# Patient Record
Sex: Male | Born: 1935 | Race: White | Hispanic: No | Marital: Married | Smoking: Former smoker
Health system: Southern US, Community
[De-identification: ages and names within clinical notes are randomized; demographics above are authoritative.]

## PROBLEM LIST (undated history)

## (undated) DIAGNOSIS — C44301 Unspecified malignant neoplasm of skin of nose: Secondary | ICD-10-CM

## (undated) DIAGNOSIS — C61 Malignant neoplasm of prostate: Secondary | ICD-10-CM

## (undated) DIAGNOSIS — E785 Hyperlipidemia, unspecified: Secondary | ICD-10-CM

## (undated) DIAGNOSIS — C44509 Unspecified malignant neoplasm of skin of other part of trunk: Secondary | ICD-10-CM

## (undated) DIAGNOSIS — G4733 Obstructive sleep apnea (adult) (pediatric): Secondary | ICD-10-CM

## (undated) DIAGNOSIS — I4892 Unspecified atrial flutter: Secondary | ICD-10-CM

## (undated) DIAGNOSIS — M4800 Spinal stenosis, site unspecified: Secondary | ICD-10-CM

## (undated) DIAGNOSIS — C44599 Other specified malignant neoplasm of skin of other part of trunk: Secondary | ICD-10-CM

## (undated) DIAGNOSIS — I38 Endocarditis, valve unspecified: Secondary | ICD-10-CM

## (undated) DIAGNOSIS — Z8719 Personal history of other diseases of the digestive system: Secondary | ICD-10-CM

## (undated) DIAGNOSIS — I6529 Occlusion and stenosis of unspecified carotid artery: Secondary | ICD-10-CM

## (undated) DIAGNOSIS — N200 Calculus of kidney: Secondary | ICD-10-CM

## (undated) DIAGNOSIS — I10 Essential (primary) hypertension: Secondary | ICD-10-CM

## (undated) DIAGNOSIS — E119 Type 2 diabetes mellitus without complications: Secondary | ICD-10-CM

## (undated) DIAGNOSIS — K219 Gastro-esophageal reflux disease without esophagitis: Secondary | ICD-10-CM

## (undated) DIAGNOSIS — I2510 Atherosclerotic heart disease of native coronary artery without angina pectoris: Secondary | ICD-10-CM

## (undated) DIAGNOSIS — IMO0002 Reserved for concepts with insufficient information to code with codable children: Secondary | ICD-10-CM

## (undated) DIAGNOSIS — N320 Bladder-neck obstruction: Secondary | ICD-10-CM

## (undated) DIAGNOSIS — F329 Major depressive disorder, single episode, unspecified: Secondary | ICD-10-CM

## (undated) DIAGNOSIS — I209 Angina pectoris, unspecified: Secondary | ICD-10-CM

## (undated) DIAGNOSIS — I639 Cerebral infarction, unspecified: Secondary | ICD-10-CM

## (undated) DIAGNOSIS — D649 Anemia, unspecified: Secondary | ICD-10-CM

## (undated) HISTORY — PX: PROSTATE BIOPSY: SHX241

## (undated) HISTORY — DX: Occlusion and stenosis of unspecified carotid artery: I65.29

## (undated) HISTORY — PX: CARDIAC VALVE REPLACEMENT: SHX585

## (undated) HISTORY — DX: Major depressive disorder, single episode, unspecified: F32.9

## (undated) HISTORY — DX: Spinal stenosis, site unspecified: M48.00

## (undated) HISTORY — DX: Endocarditis, valve unspecified: I38

## (undated) HISTORY — PX: SKIN CANCER EXCISION: SHX779

## (undated) HISTORY — DX: Hyperlipidemia, unspecified: E78.5

## (undated) HISTORY — DX: Malignant neoplasm of prostate: C61

## (undated) HISTORY — DX: Reserved for concepts with insufficient information to code with codable children: IMO0002

## (undated) HISTORY — DX: Unspecified atrial flutter: I48.92

## (undated) HISTORY — DX: Atherosclerotic heart disease of native coronary artery without angina pectoris: I25.10

---
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
## 1978-11-19 HISTORY — PX: CERVICAL DISC SURGERY: SHX588

## 1994-03-20 HISTORY — PX: LUMBAR DISC SURGERY: SHX700

## 1995-03-21 HISTORY — PX: POSTERIOR FUSION LUMBAR SPINE: SUR632

## 1998-11-19 HISTORY — PX: ANTERIOR CERVICAL DECOMP/DISCECTOMY FUSION: SHX1161

## 1999-07-02 ENCOUNTER — Ambulatory Visit: Admission: RE | Admit: 1999-07-02 | Discharge: 1999-07-02 | Payer: Self-pay | Admitting: Internal Medicine

## 2000-08-05 ENCOUNTER — Ambulatory Visit (HOSPITAL_COMMUNITY): Admission: RE | Admit: 2000-08-05 | Discharge: 2000-08-05 | Payer: Self-pay | Admitting: Neurological Surgery

## 2000-08-05 ENCOUNTER — Encounter: Payer: Self-pay | Admitting: Neurological Surgery

## 2000-08-11 ENCOUNTER — Encounter: Payer: Self-pay | Admitting: Neurological Surgery

## 2000-08-11 ENCOUNTER — Ambulatory Visit (HOSPITAL_COMMUNITY): Admission: RE | Admit: 2000-08-11 | Discharge: 2000-08-11 | Payer: Self-pay | Admitting: Neurological Surgery

## 2000-08-15 ENCOUNTER — Encounter: Admission: RE | Admit: 2000-08-15 | Discharge: 2000-11-13 | Payer: Self-pay | Admitting: Neurological Surgery

## 2001-12-27 ENCOUNTER — Encounter: Admission: RE | Admit: 2001-12-27 | Discharge: 2002-02-04 | Payer: Self-pay | Admitting: Neurological Surgery

## 2003-05-05 ENCOUNTER — Encounter (INDEPENDENT_AMBULATORY_CARE_PROVIDER_SITE_OTHER): Payer: Self-pay | Admitting: *Deleted

## 2003-05-05 ENCOUNTER — Inpatient Hospital Stay (HOSPITAL_COMMUNITY): Admission: RE | Admit: 2003-05-05 | Discharge: 2003-05-06 | Payer: Self-pay | Admitting: *Deleted

## 2003-05-05 HISTORY — PX: CAROTID ENDARTERECTOMY: SUR193

## 2003-05-08 ENCOUNTER — Inpatient Hospital Stay (HOSPITAL_COMMUNITY): Admission: EM | Admit: 2003-05-08 | Discharge: 2003-05-12 | Payer: Self-pay | Admitting: Emergency Medicine

## 2003-05-09 HISTORY — PX: HEMATOMA EVACUATION: SHX5118

## 2003-06-16 ENCOUNTER — Encounter: Admission: RE | Admit: 2003-06-16 | Discharge: 2003-06-16 | Payer: Self-pay | Admitting: *Deleted

## 2003-06-24 ENCOUNTER — Inpatient Hospital Stay (HOSPITAL_COMMUNITY): Admission: EM | Admit: 2003-06-24 | Discharge: 2003-06-29 | Payer: Self-pay | Admitting: Emergency Medicine

## 2003-08-19 ENCOUNTER — Ambulatory Visit (HOSPITAL_COMMUNITY): Admission: RE | Admit: 2003-08-19 | Discharge: 2003-08-19 | Payer: Self-pay | Admitting: Internal Medicine

## 2003-08-19 ENCOUNTER — Encounter: Payer: Self-pay | Admitting: Cardiology

## 2003-10-09 ENCOUNTER — Encounter (INDEPENDENT_AMBULATORY_CARE_PROVIDER_SITE_OTHER): Payer: Self-pay | Admitting: Specialist

## 2003-10-09 ENCOUNTER — Ambulatory Visit (HOSPITAL_COMMUNITY): Admission: RE | Admit: 2003-10-09 | Discharge: 2003-10-09 | Payer: Self-pay | Admitting: Gastroenterology

## 2003-10-20 ENCOUNTER — Ambulatory Visit (HOSPITAL_COMMUNITY): Admission: RE | Admit: 2003-10-20 | Discharge: 2003-10-21 | Payer: Self-pay | Admitting: *Deleted

## 2003-10-20 HISTORY — PX: CARDIAC CATHETERIZATION: SHX172

## 2003-10-20 HISTORY — PX: CORONARY ANGIOPLASTY WITH STENT PLACEMENT: SHX49

## 2004-09-08 ENCOUNTER — Ambulatory Visit (HOSPITAL_COMMUNITY): Admission: RE | Admit: 2004-09-08 | Discharge: 2004-09-08 | Payer: Self-pay | Admitting: *Deleted

## 2005-07-04 ENCOUNTER — Encounter: Admission: RE | Admit: 2005-07-04 | Discharge: 2005-07-04 | Payer: Self-pay | Admitting: Internal Medicine

## 2005-07-25 ENCOUNTER — Ambulatory Visit (HOSPITAL_COMMUNITY): Admission: RE | Admit: 2005-07-25 | Discharge: 2005-07-25 | Payer: Self-pay | Admitting: Neurological Surgery

## 2005-07-27 ENCOUNTER — Encounter: Admission: RE | Admit: 2005-07-27 | Discharge: 2005-08-18 | Payer: Self-pay | Admitting: Neurological Surgery

## 2005-08-14 ENCOUNTER — Emergency Department (HOSPITAL_COMMUNITY): Admission: EM | Admit: 2005-08-14 | Discharge: 2005-08-14 | Payer: Self-pay | Admitting: Emergency Medicine

## 2005-08-22 ENCOUNTER — Inpatient Hospital Stay (HOSPITAL_COMMUNITY): Admission: RE | Admit: 2005-08-22 | Discharge: 2005-08-23 | Payer: Self-pay | Admitting: Neurological Surgery

## 2006-10-03 ENCOUNTER — Ambulatory Visit (HOSPITAL_COMMUNITY): Admission: RE | Admit: 2006-10-03 | Discharge: 2006-10-03 | Payer: Self-pay | Admitting: Gastroenterology

## 2006-10-03 ENCOUNTER — Encounter (INDEPENDENT_AMBULATORY_CARE_PROVIDER_SITE_OTHER): Payer: Self-pay | Admitting: Gastroenterology

## 2007-01-07 ENCOUNTER — Ambulatory Visit (HOSPITAL_COMMUNITY): Admission: RE | Admit: 2007-01-07 | Discharge: 2007-01-07 | Payer: Self-pay | Admitting: Orthopedic Surgery

## 2007-01-07 HISTORY — PX: SHOULDER ARTHROSCOPY: SHX128

## 2007-02-07 ENCOUNTER — Ambulatory Visit: Payer: Self-pay | Admitting: *Deleted

## 2007-11-14 ENCOUNTER — Encounter: Admission: RE | Admit: 2007-11-14 | Discharge: 2008-02-12 | Payer: Self-pay | Admitting: Neurological Surgery

## 2007-11-15 ENCOUNTER — Ambulatory Visit (HOSPITAL_COMMUNITY): Admission: RE | Admit: 2007-11-15 | Discharge: 2007-11-15 | Payer: Self-pay | Admitting: Neurological Surgery

## 2008-02-06 ENCOUNTER — Ambulatory Visit: Payer: Self-pay | Admitting: *Deleted

## 2008-02-18 ENCOUNTER — Encounter: Admission: RE | Admit: 2008-02-18 | Discharge: 2008-03-19 | Payer: Self-pay | Admitting: Neurological Surgery

## 2009-01-29 ENCOUNTER — Ambulatory Visit: Payer: Self-pay | Admitting: Vascular Surgery

## 2009-04-06 ENCOUNTER — Encounter: Admission: RE | Admit: 2009-04-06 | Discharge: 2009-05-19 | Payer: Self-pay | Admitting: Internal Medicine

## 2009-09-02 ENCOUNTER — Encounter: Admission: RE | Admit: 2009-09-02 | Discharge: 2009-09-02 | Payer: Self-pay | Admitting: Internal Medicine

## 2010-02-18 ENCOUNTER — Ambulatory Visit: Payer: Self-pay | Admitting: Vascular Surgery

## 2010-03-04 ENCOUNTER — Ambulatory Visit: Payer: Self-pay | Admitting: Vascular Surgery

## 2010-03-20 DIAGNOSIS — C44301 Unspecified malignant neoplasm of skin of nose: Secondary | ICD-10-CM

## 2010-03-20 HISTORY — DX: Unspecified malignant neoplasm of skin of nose: C44.301

## 2010-04-10 ENCOUNTER — Encounter: Payer: Self-pay | Admitting: *Deleted

## 2010-05-25 ENCOUNTER — Ambulatory Visit (HOSPITAL_COMMUNITY)
Admission: RE | Admit: 2010-05-25 | Discharge: 2010-05-25 | Disposition: A | Discharge status: Home / Self Care | Payer: Medicare Other | Source: Ambulatory Visit | Attending: Interventional Cardiology | Admitting: Interventional Cardiology

## 2010-05-25 DIAGNOSIS — F341 Dysthymic disorder: Secondary | ICD-10-CM | POA: Insufficient documentation

## 2010-05-25 DIAGNOSIS — R0989 Other specified symptoms and signs involving the circulatory and respiratory systems: Secondary | ICD-10-CM | POA: Insufficient documentation

## 2010-05-25 DIAGNOSIS — R079 Chest pain, unspecified: Secondary | ICD-10-CM | POA: Insufficient documentation

## 2010-05-25 DIAGNOSIS — R0609 Other forms of dyspnea: Secondary | ICD-10-CM | POA: Insufficient documentation

## 2010-05-25 DIAGNOSIS — E669 Obesity, unspecified: Secondary | ICD-10-CM | POA: Insufficient documentation

## 2010-05-25 DIAGNOSIS — Z7982 Long term (current) use of aspirin: Secondary | ICD-10-CM | POA: Insufficient documentation

## 2010-05-25 DIAGNOSIS — F17200 Nicotine dependence, unspecified, uncomplicated: Secondary | ICD-10-CM | POA: Insufficient documentation

## 2010-07-29 ENCOUNTER — Other Ambulatory Visit: Payer: Self-pay | Admitting: Dermatology

## 2010-08-02 NOTE — Op Note (Signed)
[name], [name]              ACCOUNT NO.:  [id number]   MEDICAL RECORD NO.:  [id number]          PATIENT TYPE:  AMB   LOCATION:  ENDO                         FACILITY:  [facility]   PHYSICIAN:  Danise Edge, M.D.   DATE OF BIRTH:  1935-12-09   DATE OF PROCEDURE:  10/03/2006  DATE OF DISCHARGE:                               OPERATIVE REPORT   PROCEDURE:  1. Savary esophageal dilation  2. Distal esophageal biopsy.   PROCEDURE INDICATIONS:  Mr. Shannon Kirkendall is a 75 year old male born  03/24/1935.  Mr. Nghiem has undergone esophageal dilation in  the past to dilate a benign peptic stricture at the esophagogastric  junction.  He reports mild solid food dysphagia.   ENDOSCOPIST:  Danise Edge, M.D.   PREMEDICATION:  Fentanyl 50 mcg, Versed 5 mg.   DESCRIPTION OF PROCEDURE:  After obtaining informed consent Mr.  Chavarria was placed in the left lateral decubitus position.  I  administered intravenous fentanyl and intravenous Versed to achieve  conscious sedation for the procedure.  The patient's blood pressure,  oxygen saturation, and cardiac rhythm were monitored throughout the  procedure and documented in the medical record.   The Pentax gastroscope was passed through the posterior hypopharynx into  the proximal esophagus without difficulty.  I did not examine the larynx  or vocal cords.   ESOPHAGOSCOPY:  The proximal and mid segments of the esophagus appeared  normal.  The esophagogastric junction is friable, associated with a  short segment of benign-appearing peptic stricture.  There is no  endoscopic evidence for the presence of Barrett's esophagus.   GASTROSCOPY:  Retroflex view of the gastric cardia and fundus was  normal.  The gastric body appears normal.  There are scattered erosions  in the gastric antrum probably secondary to aspirin.  The pylorus  appears normal.   DUODENOSCOPY:  The duodenal bulb and descending duodenum appear normal.   SAVARY  ESOPHAGEAL DILATION:  The Savary dilator wire was passed through  the gastroscope and advanced to the distal gastric antrum.  The 15-mm  Savary dilator passed without resistance.  Repeat esophagogastrostomy  showed no gastric trauma due to the guidewire.  Biopsies were taken at  the esophagogastric junction.   ASSESSMENT:  Benign peptic stricture at the esophagogastric junction  dilated with the 15-mm Savary dilator.  Esophageal biopsies were  performed at the esophagogastric junction.  Erosions probably due to  chronic aspirin use are present in the distal gastric antrum.           ______________________________  Danise Edge, M.D.     MJ/MEDQ  D:  10/03/2006  T:  10/04/2006  Job:  [id number]

## 2010-08-02 NOTE — Op Note (Signed)
[name], [name]              ACCOUNT NO.:  [id number]   MEDICAL RECORD NO.:  [id number]          PATIENT TYPE:  AMB   LOCATION:  SDS                          FACILITY:  [facility]   PHYSICIAN:  Loreta Ave, M.D. DATE OF BIRTH:  September 05, 1935   DATE OF PROCEDURE:  DATE OF DISCHARGE:  01/07/2007                               OPERATIVE REPORT   PREOPERATIVE DIAGNOSIS:  Left shoulder impingement, labral tear.  Distal  clavicle osteolysis secondary adhesive capsulitis.   POSTOPERATIVE DIAGNOSIS:  Left shoulder impingement, labral tear.  Distal clavicle osteolysis secondary adhesive capsulitis, with also  partial tearing long head biceps tendon.   PROCEDURE:  Left shoulder exam under anesthesia followed by  manipulation.  Arthroscopy, debridement of labrum, lysis debridement of  adhesions.  Acromioplasty, coracoacromial ligament release.  Excision  distal clavicle.   SURGEON:  Loreta Ave, MD   ASSISTANT:  Zonia Kief, PA   ANESTHESIA:  General.   BLOOD LOSS:  Minimal.   SPECIMENS:  None.   CULTURES:  None.   COMPLICATIONS:  None.   DRESSING:  Soft compressive with sling.   PROCEDURE:  The patient brought to the operating room, placed on  operating table supine position.  After adequate anesthesia been  obtained, the symptomatic left shoulder examined.  Moderate restricted  motion 25-30% all planes.  Gently manipulated breaking up obvious  adhesions achieving full motion maintaining stable shoulder.  Placed in  beach-chair position on the shoulder positioner, after prepped and  draped usual sterile fashion.  Three portals, anterior, posterior and  lateral.  Shoulder entered with blunt obturator.  Arthroscope  introduced, shoulder distended and inspected.  Marked circumferential  tearing labrum debrided.  Intra-articular adhesions debrided.  Some  grade 2 change on the glenoid and a small focal grade 4 area of the  front of the glenoid.  Two chondral loose bodies.   Biceps tendon was  still intact and anchored at the top of the glenoid but with some  fraying and thinning.  Majority was still intact after debridement.  Not  detached on top.  Undersurface of cuff looked good.  Cannula redirected  subacromially.  Type 2 acromion.  Obvious impingement, reactive  bursitis, fraying on the top of the cuff but no full-thickness tears.  Cuff debrided.  Bursa resected.  Acromioplasty to a type 1 acromion with  shaver high-speed bur, releasing the CA ligament cautery.  Distal  clavicle grade 4 changes.  Periarticular spurs and lateral centimeter of  clavicle resected.  Adequacy of decompression clavicle excision cuff  debridement confirmed viewing  from all portals.  Instruments and fluid removed.  Portals injected  Marcaine closed with nylon.  Sterile compressive dressing applied.  Sling applied.  Anesthesia reversed.  Brought to recovery room.  Tolerated surgery well.  No complications.      Loreta Ave, M.D.  Electronically Signed     DFM/MEDQ  D:  01/07/2007  T:  01/08/2007  Job:  [id number]

## 2010-08-02 NOTE — Procedures (Signed)
CAROTID DUPLEX EXAM   INDICATION:  Followup carotid disease.   HISTORY:  Diabetes:  Yes.  Cardiac:  CAD with stents.  Hypertension:  Yes.  Smoking:  Quit.  Previous Surgery:  Right CEA 2005 performed by Dr. Madilyn Fireman.  CV History:  Amaurosis Fugax No, Paresthesias No, Hemiparesis No                                       RIGHT             LEFT  Brachial systolic pressure:         146               144  Brachial Doppler waveforms:         WNL               WNL  Vertebral direction of flow:        Antegrade         Antegrade/abnormal  DUPLEX VELOCITIES (cm/sec)  CCA peak systolic                   98                110  ECA peak systolic                   131               110  ICA peak systolic                   99                156  ICA end diastolic                   13                29  PLAQUE MORPHOLOGY:                  NA                Heterogeneous  PLAQUE AMOUNT:                      Minimal           Mild  PLAQUE LOCATION:                    NA                CCA/ICA   IMPRESSION:  1. Widely patent right carotid endarterectomy without evidence of      restenosis.  2. 40%-59% left internal carotid artery possibly elevated due to mild      tortuosity.  3. Antegrade vertebral arteries with abnormal waveforms on the left.  4. Stable findings compared to the report of 01/29/2009.   ___________________________________________  Leonides Sake, MD   LT/MEDQ  D:  02/18/2010  T:  02/18/2010  Job:  [id number]

## 2010-08-02 NOTE — Procedures (Signed)
CAROTID DUPLEX EXAM   INDICATION:  Followup known carotid artery disease.   HISTORY:  Diabetes:  Yes.  Cardiac:  Coronary artery disease, cardiac stents in 10/2003.  Hypertension:  Yes.  Smoking:  Quit.  Previous Surgery:  Right carotid endarterectomy with DPA in 2005 by Dr.  Madilyn Fireman.  CV History:  No.  Amaurosis Fugax No, Paresthesias No, Hemiparesis No                                       RIGHT             LEFT  Brachial systolic pressure:         140               130  Brachial Doppler waveforms:         Normal            Normal  Vertebral direction of flow:        Antegrade         Antegrade  DUPLEX VELOCITIES (cm/sec)  CCA peak systolic                   81                104  ECA peak systolic                   110               120  ICA peak systolic                   77                119  ICA end diastolic                   13                33  PLAQUE MORPHOLOGY:                  N/A               Calcified  PLAQUE AMOUNT:                      N/A               Mild  PLAQUE LOCATION:                    N/A               ICA / ECA   IMPRESSION:  1. Right internal carotid artery shows no evidence of restenosis      status post carotid endarterectomy.  2. Left internal carotid artery shows 20%-39% stenosis.  3. Left internal carotid appears tortuous.  4. Antegrade flow in bilateral vertebrals.   ___________________________________________  Di Kindle. Edilia Bo, M.D.   CB/MEDQ  D:  01/29/2009  T:  01/29/2009  Job:  [id number]

## 2010-08-02 NOTE — Assessment & Plan Note (Signed)
OFFICE VISIT   [name], [name]  DOB:  Sep 09, 1935                                       03/04/2010  NWGNF#:[id number]   HISTORY OF PRESENT ILLNESS:  This is a 75 year old gentleman who  presents for routine follow-up.  He was last seen by a physician in  2005.  His last carotid duplex was completed in January 21, 2009.  At  that point, it demonstrated no recurrence of his right internal carotid  stenosis status post a right carotid endarterectomy and a 20-39%  stenosis in the left internal carotid artery.  He has never had any  previous stroke or TIA symptoms involving the left side, specifically,  he has never had any amaurosis fugax, monocular blindness, any facial  drooping or hemiplegia and also never any expressive or receptive  aphasia.  His risk factors for carotid disease include hyperlipidemia,  hypertension, and diabetes.  His risk factor management included use of  Crestor, aspirin, metformin and Diovan.  His neurologic review of  systems was otherwise negative except for a history of left leg and  right arm weakness related to multiple spinal surgeries.   PAST MEDICAL HISTORY:  Coronary artery disease, major depression,  hyperlipidemia, the left leg and right arm weakness related to spinal  stenosis and degenerative disk disease, mitral valve prolapse and aortic  valve her regurgitation and also diabetes.   PAST SURGICAL HISTORY:  Includes a appendectomy, tonsillectomy,  adenoidectomy, multiple spinal surgeries both in the C-spine and the  lumbar.  He has had a right carotid endarterectomy done in 2005 and has  had 2 cardiac stents placed and then a left shoulder repair.   SOCIAL HISTORY:  Previously was an active smoker greater than 80 pack  year smoking.  However, he quit in July 1986.  No alcohol or illicit  drug use.   FAMILY HISTORY:  He notes his father had a stroke.  Mother had diabetes  and coronary artery disease.   MEDICATIONS:   His medications included Crestor, Prozac, metformin,  Diovan, Prilosec and aspirin.   ALLERGIES:  No known drug allergies.   REVIEW OF SYSTEMS:  He noted 2 bad cardiac valve and reflux.  Otherwise  the rest of his review of systems was noted to be negative.   PHYSICAL EXAMINATION:  On physical examination today, he had a blood  pressure 159/55 in the right arm and in the left arm 148/64, heart rate  of 86, respirations of 14.  GENERAL:  He is alert and oriented x3, well-developed, well-nourished.  HEAD:  Normocephalic, atraumatic.  ENT:  Hearing was grossly intact.  Nares without any erythema or  drainage.  The oropharynx had erythema without any exudate.  EYES:  Pupils were equal, round, and reactive to light.  Extraocular  movements were intact.  NECK:  Supple neck.  No nuchal rigidity.  No palpable lymphadenopathy.  PULMONARY:  He has symmetric expansion.  Good air movement.  He is clear  to auscultation bilaterally.  He had no rales, rhonchi or wheezing.  CARDIAC:  Regular rate and rhythm.  Normal S1, S2.  No murmurs, rubs,  thrills or gallops.  VASCULAR:  He had palpable radial and brachial pulses and palpable  carotid pulses bilaterally.  His aorta was not palpable.  Femorals were  palpable as were popliteal, dorsalis pedis and posterior tibials  bilaterally.  GI:  He has soft abdomen nontender, nondistended guarding or rebound.  No splenomegaly.  No costovertebral angle tenderness.  MUSCULOSKELETAL:  He had 5/5 strength in all extremities.  He had no  ulcerations or gangrene in any extremity.  NEURO:  Cranial nerves II-XII were intact.  Motor exam was as listed  above.  He has sensation intact in all extremities.  PSYCH:  Judgment is intact.  Mood and affect were appropriate for his  clinical situation.  SKIN:  He had some actinic keratoses on the face, otherwise I saw no  rashes elsewhere.  EXTREMITIES:  As listed above.  LYMPHATICS:  No cervical, axillary, or inguinal  lymphadenopathy.   Noninvasive vascular imaging:  He had bilateral carotid duplexes  completed.  On the right side, the peak systolic velocity is 99 cm/sec  and his peak systolic on the left was 156.  Interpretation:  Widely  patent right internal carotid artery, status post endarterectomy.  On  the left, velocities increased which was puts it up to 40-59% of  stenosis on the left but this is not really significant findings  compared to the previous velocities.   MEDICAL DECISION MAKING:  This is a 75 year old gentleman who is  completely asymptomatic now status post a right carotid endarterectomy  who has a 40-59% left internal carotid artery stenosis which is not  significantly higher from his previous measurement.  He should be placed  on an annual duplex surveillance.  At this point, there is no indication  for any type of intervention based on ACAS data.  Will continue to see  him on an annual basis.  Thank you for allowing [place] to participate in  this patient'[name] care.     Fransisco Hertz, MD  Electronically Signed   BLC/MEDQ  D:  03/04/2010  T:  03/07/2010  Job:  [id number]

## 2010-08-02 NOTE — Procedures (Signed)
CAROTID DUPLEX EXAM   INDICATION:  Follow up carotid artery disease.   HISTORY:  Diabetes:  Yes.  Cardiac:  CAD, cardiac stents in 10/2003.  Hypertension:  Yes.  Smoking:  Quit.  Previous Surgery:  Right CEA with DPA in 2005 by Dr. Madilyn Fireman.  CV History:  No.  Amaurosis Fugax No, Paresthesias No, Hemiparesis No.                                       RIGHT             LEFT  Brachial systolic pressure:         148               154  Brachial Doppler waveforms:         Triphasic         Triphasic  Vertebral direction of flow:        Antegrade         Antegrade  DUPLEX VELOCITIES (cm/sec)  CCA peak systolic                   75                142  ECA peak systolic                   151               128  ICA peak systolic                   87                100  ICA end diastolic                   20                22  PLAQUE MORPHOLOGY:                  N/A               Calcified  PLAQUE AMOUNT:                      N/A               Mild  PLAQUE LOCATION:                    N/A               ICA/ECA/CCA   IMPRESSION:  1. Right internal carotid artery shows no evidence of restenosis,      status post carotid endarterectomy.  2. Left internal carotid artery shows evidence of 20-39% stenosis.  3. Left internal carotid artery appears tortuous.  4. Mild common carotid artery stenosis.  5. No significant changes from previous study.   ___________________________________________  P. Liliane Bade, M.D.   AS/MEDQ  D:  02/06/2008  T:  02/06/2008  Job:  [id number]

## 2010-08-02 NOTE — Procedures (Signed)
CAROTID DUPLEX EXAM   INDICATION:  Followup carotid artery disease.   HISTORY:  Diabetes:  Yes.  Cardiac:  Coronary artery disease, cardiac stents in August of 2005.  Hypertension:  Yes.  Smoking:  Quit in 1980.  Previous Surgery:  Right carotid endarterectomy with DPA in 2005 by Dr.  Madilyn Fireman.  CV History:  Amaurosis Fugax No, Paresthesias No, Hemiparesis No                                       RIGHT             LEFT  Brachial systolic pressure:         150               144  Brachial Doppler waveforms:         Biphasic          Biphasic  Vertebral direction of flow:        Antegrade         Antegrade  DUPLEX VELOCITIES (cm/sec)  CCA peak systolic                   108               146  ECA peak systolic                   125               101  ICA peak systolic                   66                83  ICA end diastolic                   14                16  PLAQUE MORPHOLOGY:                  None              Calcified  PLAQUE AMOUNT:                      None              Mild  PLAQUE LOCATION:                                      CCA, ECA   IMPRESSION:  1. No right ICA stenosis status post endarterectomy.  2. A 20% to 39% left ICA stenosis.  3. Mildly elevated velocities in the left CCA are stable from previous      exam.  4. Study essentially unchanged from 02/14/2006.   ___________________________________________  P. Liliane Bade, M.D.   DP/MEDQ  D:  02/07/2007  T:  02/08/2007  Job:  [id number]

## 2010-08-03 ENCOUNTER — Ambulatory Visit (HOSPITAL_COMMUNITY)
Admission: RE | Admit: 2010-08-03 | Discharge: 2010-08-03 | Disposition: A | Discharge status: Home / Self Care | Payer: Medicare Other | Source: Ambulatory Visit | Attending: Internal Medicine | Admitting: Internal Medicine

## 2010-08-03 DIAGNOSIS — I4891 Unspecified atrial fibrillation: Secondary | ICD-10-CM | POA: Insufficient documentation

## 2010-08-03 DIAGNOSIS — Z01811 Encounter for preprocedural respiratory examination: Secondary | ICD-10-CM | POA: Insufficient documentation

## 2010-08-03 DIAGNOSIS — Z79899 Other long term (current) drug therapy: Secondary | ICD-10-CM | POA: Insufficient documentation

## 2010-08-05 NOTE — Consult Note (Signed)
NAME:  [name], [name]                        ACCOUNT NO.:  [id number]   MEDICAL RECORD NO.:  [id number]                   PATIENT TYPE:  INP   LOCATION:  3742                                 FACILITY:  [facility]   PHYSICIAN:  Thora Lance, M.D.               DATE OF BIRTH:  [birth year]   DATE OF CONSULTATION:  DATE OF DISCHARGE:                                   CONSULTATION   HISTORY OF PRESENT ILLNESS:  A 75 year old white male, status post right  carotid endarterectomy on May 05, 2003, who is admitted with a severe  right-sided headache, blurred vision, nausea, and vomiting.  His blood  pressure was high as 234/88 last night in the ER and down to 153/67 after  one dose of labetalol last night and pain control. I was asked to see the  patient for ongoing medical care as the patient has not had a primary care  physician in [place].   PAST MEDICAL HISTORY:  1. GERD for years, history of esophageal stricture.  2. Borderline diabetes mellitus, blood sugars dropped after 20 pound weight     loss.  3. High cholesterol, was in cholesterol study at [facility] until right     carotid blockage was found.  4. Depression.  5. Status post right carotid endarterectomy.   PAST SURGICAL HISTORY:  1. Right carotid endarterectomy on May 05, 2003, by Dr. Madilyn Fireman.  2. Lumbosacral surgery.  3. One cervical spine surgery.  4. Appendectomy.  5. Tonsillectomy.   ALLERGIES:  NO KNOWN DRUG ALLERGIES   CURRENT MEDICATIONS:  1. Prozac 20 mg p.o. daily.  2. Nexium one daily.  3. Multivitamin daily.  4. Vitamin C.  5. Aspirin one a day.  6. Tylox p.r.n.   FAMILY HISTORY:  Father died of CVA in the 2s. Mother with diabetes  mellitus, heart disease late in life. Brother with good health.   SOCIAL HISTORY:  Married. Tobacco--quit in 1980. Alcohol--none. A local  criminal attorney.   PHYSICAL EXAMINATION:  GENERAL: He appears comfortable this morning in the  ER.  VITAL SIGNS: Blood  pressure initially 234/88 and down to 162/67, heart rate  112 down to 68, respirations 24, and temperature 97.0.  Oxygen saturation  95% on room air.  NECK: No bruits.  LUNGS: Clear.  HEART: Regular rate and rhythm with a 2/6 systolic ejection murmur.  ABDOMEN: Soft and nontender with normal bowel sounds.  EXTREMITIES: No edema.   WBC 12.6, glucose 187, BMET normal.   ASSESSMENT:  1. Status post right carotid endarterectomy with severe headache,     hyperperfusion syndrome. Cardiovascular/thoracic surgery managing and     has started steroids and amitriptyline.  2. Elevated blood pressure likely secondary to severe headache. Time will     tell whether he has sustained hypertension (blood pressure okay in past     per patient).  3. Borderline diabetes mellitus with improvement, status post  weight loss.  4. History of hypercholesterolemia.  5. Gastroesophageal reflux disease.  6. Depression.   RECOMMENDATIONS:  1. Follow up blood pressure. May need antihypertensive.  2. When discharged, will see the patient within a few days in my office.     Will need control of blood pressure, lipids, and blood sugars.  3. Check hemoglobin A1C, lipids, and CBGs on the floor.                                               Thora Lance, M.D.    Delorse Limber  D:  06/25/2003  T:  06/25/2003  Job:  [id number]   cc:   Balinda Quails, M.D.  [address]  [place]  [place] [address]

## 2010-08-05 NOTE — Cardiovascular Report (Signed)
NAME:  [name], [name]                        ACCOUNT NO.:  [id number]   MEDICAL RECORD NO.:  [id number]                   PATIENT TYPE:  OIB   LOCATION:  6523                                 FACILITY:  [facility]   PHYSICIAN:  Meade Maw, M.D.                 DATE OF BIRTH:  28-Mar-1935   DATE OF PROCEDURE:  DATE OF DISCHARGE:                              CARDIAC CATHETERIZATION   INDICATIONS FOR PROCEDURE:  Reversible ischemic demonstrated in the  inferolateral region on Cardiolite.   PROCEDURE:  After obtaining written informed consent, the patient was  brought to the cardiac catheterization lab in a post absorptive state.  Preop sedation was achieved using IV Versed.  The right groin was prepped  and draped in the usual sterile fashion.  Local anesthesia was achieved  using 1% Xylocaine.  A 6 French hemostasis sheath was placed into the right  femoral artery.  Using a modified Seldinger technique, selective coronary  angiography was performed using a JL4 and  JR4 Judkins catheters.  Multiple  views were obtained.  Catheter exchange was made over a guide-wire.  A  single frame ventriculogram was performed in the RAO position using the 6  French pigtail catheter.  Following the films, Dr. Amil Amen was consulted for  consideration of percutaneous revascularization and concluded that  revascularization was feasible.  It was elected to proceed with a staged  intervention.  The patient was transferred off the table to the holding  area.  The hemostasis sheath was removed.  Hemostasis was achieved using  digital pressure.  Anticipate percutaneous revascularization will be at a  later time.   FINDINGS:  Aortic pressure 168/66, LV pressure 166/15, EDP 21.  Single plane  ventriculogram revealed normal wall motion.  Ejection fraction 65%.   Coronary angiography fluoroscopy reveals significant calcification of the  proximal vessel.  The left main is short.  It bifurcates into the left  anterior descending and circumflex vessel.  There is no identifiable disease  in the left main coronary artery.  Left anterior descending gave rise to a  small to moderate D1, small D2, small D3, goes on as an apical branch.  There is mild diffuse disease throughout the left anterior descending.  There is a 40-50% lesion in the small to moderate first diagonal.  The  distal LAD has up to 50% lesion noted.  The circumflex vessel is a large  caliber vessel and covers most of the inferior posterolateral circulation  and gives rise to a large OM1 which has a 40-50% proximal lesion, a large  OM2 which bifurcates, there is diffuse disease in this OM2 of up to 50-60%.  Following the second obtuse marginal, there is subtotal occlusion of the mid  to distal circumflex.  The right coronary artery is a markedly tortuous  vessel and has diffuse disease throughout.  It gives rise to a PDA.  There  is diffuse disease in  the right coronary artery of up to 40-50% in the  proximal vessel.   FINAL IMPRESSION:  1. Critical disease in the mid to distal circumflex.  2. Preserved left ventricular  function.  3. Dr. Amil Amen has been consulted, he will proceed with intervention on the     circumflex vessel.                                               Meade Maw, M.D.    HP/MEDQ  D:  10/20/2003  T:  10/20/2003  Job:  [id number]

## 2010-08-05 NOTE — Op Note (Signed)
NAME:  DERAL, SCHELLENBERG                        ACCOUNT NO.:  [id number]   MEDICAL RECORD NO.:  [id number]                   PATIENT TYPE:  INP   LOCATION:  2899                                 FACILITY:  [facility]   PHYSICIAN:  Balinda Quails, M.D.                 DATE OF BIRTH:  March 29, 1935   DATE OF PROCEDURE:  05/05/2003  DATE OF DISCHARGE:                                 OPERATIVE REPORT   SURGEON:  Balinda Quails, M.D.   ASSISTANTS:  Larina Earthly, M.D.  [name], P.A.   ANESTHESIA:  General endotracheal.   ANESTHESIOLOGIST:  Maren Beach, M.D.   PREOPERATIVE DIAGNOSIS:  Severe right internal carotid artery stenosis.   POSTOPERATIVE DIAGNOSIS:  Severe right internal carotid artery stenosis.   PROCEDURE:  Right carotid endarterectomy, Dacron patch angioplasty.   CLINICAL NOTE:  Mr. Elster Corbello is a 75 year old male with a history of  severe right internal carotid artery stenosis.  This was verified by Doppler  and MR angiography.  He was brought to the operating room at this time for  right carotid endarterectomy for reduction of stroke risk.  The risks of  this operative procedure were explained to the patient with a measured  morbidity and mortality of 1 to 2% to include, but not limited to, MI, CVA,  cranial nerve injury, and death.   DESCRIPTION OF PROCEDURE:  The patient was brought to the operating room in  stable condition and placed in the supine position.  General endotracheal  anesthesia was induced.  Right neck was prepped and draped in a sterile  fashion.  A curvilinear skin incision was made along the anterior border of  the right sternomastoid muscle.  Dissection was carried down through  subcutaneous tissue and platysma with electrocautery.  Deep dissection was  carried along the anterior border of the sternomastoid to the carotid  sheath.  The facial vein was ligated with 3-0 silk and divided.  Carotid  bifurcation was exposed.  The common carotid  artery was mobilized down to  the omohyoid muscle and encircled with the vessel loop.  The vagus nerve was  reflected posteriorly and preserved.  Distal dissection was carried up to  the carotid bifurcation.  The origin of the superior thyroid and external  carotid were encircled with vessel loops.  The carotid body was assessed and  there was a nodular fullness in the carotid body.  A small biopsy of this  was obtained and sent for frozen section and returned normal nerve tissue.  The internal carotid artery was then mobilized distally up to the posterior  belly of the digastric vessels.  The hypoglossal nerve was reflected  superiorly and preserved.  The distal internal carotid artery was encircled  with the vessel loop.  The patient was then administered 7000 units of  heparin intravenously.  Adequate circulation time permitted.  Carotid  vessels were controlled with  clamps.  A longitudinal arteriotomy was made in  the distal common carotid artery.  The arteriotomy extended across the  carotid bulb and up into the internal carotid artery.  There was a large  amount of plaque at the origin of the right internal carotid artery.  This  was calcified and a high-grade stenosis present.  A shunt was inserted.  An  endarterectomy elevator was used to remove the plaque.  The endarterectomy  was carried down into the common carotid artery and plaque was divided  transversely with Potts scissors.  The superior thyroid and external carotid  were endarterectomized using an eversion technique.  The internal carotid  plaque then feathered out distally.  Fragments of plaque were removed with  plaque forceps.  The site was irrigated with heparin and saline solution.  A  Finesse Dacron patch was placed over the endarterectomy site using running 6-  0 Prolene suture.  Completion of the patch angioplasty and shunt was  removed.  All vessels were flushed.  Initial antegrade flow was directed up  the  external carotid artery following the internal carotid release.  Adequate hemostasis obtained.  The patient was administered 50 mg of  protamine intravenously.  Sternomastoid fascia was closed with running 2-0  Vicryl suture.  Platysma was closed with running 3-0 Vicryl suture.  Skin  was closed with 4-0 Monocryl.  Steri-Strips applied.  Sterile dressing  applied.  There were no apparent complications.  The patient awakened  readily, moved all extremities to command, and transferred to recovery room  in stable condition.                                               Balinda Quails, M.D.    PGH/MEDQ  D:  05/05/2003  T:  05/05/2003  Job:  [id number]

## 2010-08-05 NOTE — H&P (Signed)
NAME:  [name], [name]              ACCOUNT NO.:  [id number]   MEDICAL RECORD NO.:  [id number]           PATIENT TYPE:   LOCATION:                                 FACILITY:   PHYSICIAN:  Stefani Dama, M.D.       DATE OF BIRTH:   DATE OF ADMISSION:  08/22/2005  DATE OF DISCHARGE:                                HISTORY & PHYSICAL   ADMISSION DIAGNOSIS:  Cervical spondylosis with radiculopathy and  myelopathy.   HISTORY OF PRESENT ILLNESS:  [name] is a 75 year old individual who  has had ongoing problems with numbness in both his hands.  He has some low-  grade pain in his back and he is able to tolerate things.  He has had worse  symptoms with decreased sensation is his fingers in the recent months.  He  has had some right arm weakness ever since he had a diskectomy years ago by  Dr. Rosalie Doctor but notes that he is having numbness in both hands.  He  has lost considerable sensation such that he does do simple things as  buttoning a button or manipulating things with his fingertips increasingly  more poorly because of lack of sensation.  He had work-up of his cervical  spine in the last month and this demonstrates with plain radiographs and an  MRI that he had significant spondylitic degeneration most notably at C5-C6  on plain radiographs but also has he evidence of cord compression at C4-C5  due to chronic disk herniation with severe spondylitic spurring and marked  disk degeneration at the levels of C5-6, biforaminal stenosis, and C6-7 with  right-sided foraminal stenosis which is quite severe.  We discussed the  findings in the office back on May 11 and we advised a surgical  decompression.  He is now admitted to the hospital for this process.   PAST MEDICAL HISTORY:  Notable that he has had severe spondylitic disease in  the lower lumbar spine, has a solid arthrodesis at the L4-L5 level where he  has had a previous ray cage fusion at L2-L3.  He has moderate  spondylitic  stenosis, but no central stenosis.  His coronal and sagittal alignment in  his lumbar spine are normal.  His other past medical history reveals that  his general health has been fair.  He reports no significant medical  problems otherwise.  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   PHYSICAL EXAMINATION:  GENERAL:  At the current time reveals that he is  alert, oriented, cooperative individual.  NEUROLOGIC:  Range of motion reveals that he can turn about 80 degrees to  the left, 60 degrees to right.  He extends and flexes normally.  Axial  compression does not reproduce any overt pain.  Motor function reveals good  function of the deltoids.  Biceps function is graded at 4/5 bilaterally and  is particularly weakened on that right side compared to the left.  Triceps  function appears to be 4/5 bilaterally.  Finger extensor strength is  intact.  Abductor function appears to be intact.  Wrist extensor function is weak on  the right side compared to the left.  Sensory examination is vibration.  He  is distally intact in the upper extremities.  The reflexes are depressed  bilaterally in the biceps with just a trace to 1+ reflex on that right side.  Triceps reflexes is absent bilaterally.  Patellar reflexes are absent.  Achilles reflexes are 2+.  Cranial nerve examination reveals his pupils are  3 mm, briskly reactive to light and accommodation.  HEENT:  Extraocular movements are full.  Face is symmetric to grimace.  Tongue and uvula are in the midline.  Sclerae and conjunctivae are clear.  The neck reveals no masses and no bruits are heard.  LUNGS:  Clear to auscultation.  HEART:  Regular rate and rhythm.  ABDOMEN:  Soft.  Bowel sounds positive.  No masses are palpable.  EXTREMITIES:  No cyanosis, clubbing, or edema.   IMPRESSION:  Patient has evidence of spondylitic disease in the cervical  spine.  He is now being  admitted to undergo surgical decompression C4-5, C5-  6, and C6-7.      Stefani Dama, M.D.  Electronically Signed     HJE/MEDQ  D:  08/22/2005  T:  08/22/2005  Job:  [id number]

## 2010-08-05 NOTE — Cardiovascular Report (Signed)
NAME:  [name], [name]                        ACCOUNT NO.:  [id number]   MEDICAL RECORD NO.:  [id number]                   PATIENT TYPE:  OIB   LOCATION:  6523                                 FACILITY:  [facility]   PHYSICIAN:  Francisca December, M.D.               DATE OF BIRTH:  08-03-1935   DATE OF PROCEDURE:  10/20/2003  DATE OF DISCHARGE:                              CARDIAC CATHETERIZATION   PROCEDURE:  1. Percutaneous coronary intervention/drug-eluding stent implantation, third     marginal branch.  2. Percutaneous closure, right femoral artery.   INDICATIONS:  [name] is a 75 year old man with a one year history of  exertional chest pain. Myocardial infarction perfusion study showed a  possible reversible inferolateral defect. Dr. Fraser Din has completed coronary  angiography revealing a subtotal stenosis in a large third marginal branch  with proximal calcification. He is returned now to the catheterization  laboratory for percutaneous intervention.   The patient was brought to the cardiac catheterization laboratory in the  fasting state. The right groin was prepped and draped in the usual sterile  fashion. Local anesthesia was obtained with infiltration of 1% lidocaine. A  7-French catheter sheath was inserted percutaneously into the right femoral  artery using an anterior approach over a guiding J wire. The patient then  received 4,000 units of heparin intravenously and a bolus administration of  Aggrastat at 25 mcg/kg followed by a constant infusion of 0.15 mg/kg per  minute.   A 7-French #3.5 Voda left guiding catheter was advanced to the ascending  aorta where left coronary os was engaged. A 0.014-inch Scimed luge  intracoronary guide wire was passed across the lesion in the third marginal  branch without significant difficulty. The lesion was initially balloon  dilated with a 2.5/20-mm Scimed Maverick intracoronary balloon. This was  inflated twice to 6 atmospheres  for 100 seconds and then 181 seconds. This  balloon was removed. Scintiangiography revealed a dissection present at the  stenosis site. A 2.75/12-mm Scimed Taxus intracoronary drug-eluding stent  was then advanced to the lesion with some difficulty. The stent was deployed  at the pressure of 12 atmospheres for 47 seconds. This stent balloon was  removed. The stent was covering the tightness portion of the stenosis well,  and there was a good step down distally; however, proximally, there was an  easily visible interval dissection. Therefore, a second Taxus stent, 2.75/12  mm, was advanced and placed again with some difficulty. It was deployed at  peak pressure of 12 atmospheres for 83 seconds. The stent balloon was  deflated and removed. A 2.75/15-mm Quantum Maverick intracoronary balloon  was then advanced into place more distally within the stented segment and  inflated to peak pressure of 12 atmospheres for 46 seconds. It was then  deflated, withdrawn proximally slightly, and then re-inflated to 14  atmospheres for 62 seconds. It was deflated and removed. These maneuvers  revealed  excellent angiographic result and wide patency throughout the  diffusely diseased proximal third marginal branch. Adequate patency was  confirmed ____________ views, both with and without the guide wire in place.  The guiding catheter was then removed. A right femoral arteriogram in the 45-  degree RAO angulation was performed revealing a wide patency of the femoral  artery and the arteriotomy site to be above the bifurcation and into the  profunda femoris and superficial femoral arteries. Angioseal was deployed at  that point with excellent hemostasis.   FINAL IMPRESSION:  1. Atherosclerotic coronary vascular disease, single vessel.  2. Status post successful percutaneous coronary intervention/tandem drug-     eluding stent implantation proximal third marginal branch.  3. Typical angina was not reproduced  with device insertion or balloon     inflation.                                               Francisca December, M.D.    JHE/MEDQ  D:  10/20/2003  T:  10/21/2003  Job:  [id number]   cc:   Thora Lance, M.D.  [address]  [place]  [place] [address]  Fax: [phone]

## 2010-08-05 NOTE — Discharge Summary (Signed)
NAMEJUNO, [name]              ACCOUNT NO.:  [id number]   MEDICAL RECORD NO.:  [id number]          PATIENT TYPE:  INP   LOCATION:  3028                         FACILITY:  [facility]   PHYSICIAN:  Stefani Dama, M.D.  DATE OF BIRTH:  1936/01/06   DATE OF ADMISSION:  08/22/2005  DATE OF DISCHARGE:  08/23/2005                                 DISCHARGE SUMMARY   ADMISSION DIAGNOSES:  Cervical spondylosis with myelopathy, C4-C5, C5-C6 and  C6-C7.   DISCHARGE DIAGNOSES:  1.  Cervical spondylosis with myelopathy, C4-C5, C5-C6 and C6-C7.  2.  Urinary retention.   MAJOR OPERATIONS:  Anterior cervical decompression, arthrodesis with PEEK  spacer Allograft, C4-C5, C5-C6 and C6-C7.  Alphatec plate fixation.   CONDITION ON DISCHARGE:  Improving.   HOSPITAL COURSE:  [name] is a 75 year old individual who has had  significant back and bilateral lower extremity problems.  He has had neck  pain and upper extremity spasms and dysesthesias consistent with myelopathic  findings which were confirmed by MRI scan which demonstrated compression of  C4-C5 and C5-C6 areas worse and C6-C7 less severely.  After consideration of  the patient's condition, we advised him anterior decompression arthrodesis  at the three levels of worse involvement which were C4-C5, C5-C6 and C6-C7.  He underwent the surgery and tolerated it well.  He had some difficulty with  urinary retention and required placement of a Foley catheter overnight.  Subsequently the patient was given some Flomax and he seems to be able to  void  without any restriction at the current time.  He is advised regarding  postoperative activities, given a prescription for Percocet #40 without  refills, Valium 5 mg #30 without refills and will be seen in the office in  three weeks' time for followup.      Stefani Dama, M.D.  Electronically Signed     HJE/MEDQ  D:  08/23/2005  T:  08/24/2005  Job:  [id number]

## 2010-08-05 NOTE — Discharge Summary (Signed)
NAME:  [name], [name]                        ACCOUNT NO.:  [id number]   MEDICAL RECORD NO.:  [id number]                   PATIENT TYPE:  INP   LOCATION:  3002                                 FACILITY:  [facility]   PHYSICIAN:  Janetta Hora. Fields, MD               DATE OF BIRTH:  [birth year]   DATE OF ADMISSION:  05/08/2003  DATE OF DISCHARGE:  05/12/2003                                 DISCHARGE SUMMARY   ADMISSION DIAGNOSES:  Hematoma of the right neck with airway deviation,  pain, hoarseness, and hiccups.   DISCHARGE DIAGNOSES:  Hematoma of the right neck status post carotid  endarterectomy status post evacuation of right neck hematoma.   PAST MEDICAL HISTORY:  1. Carotid artery disease.  2. Depression.  3. Hyperlipidemia.  4. Chronic weakness in the left leg and arm which he attributes to his     previous neck and lower back surgeries.  5. Peripheral neuropathy status post back surgery.  6. History of hyperglycemia without definitive diagnosis of diabetes.  7. History of persistent cough with questionable etiology, believed to be     secondary to sinus drainage versus reflux. The patient was recently     started on Nexium and stated cough has improved.   PAST SURGICAL HISTORY:  1. Appendectomy 1951.  2. Tonsillectomy in 1956.  3. Cervical spine surgery in 1987.  4. Back surgery x2 in 1996 and 1997.  5. Carotid endarterectomy on May 05, 2003.   BRIEF HISTORY:  This is a 75 year old white male, patient of Dr. Madilyn Fireman, who  is status post right carotid endarterectomy on May 05, 2003. The  patient was discharged to home in stable condition on May 06, 2003  without complaint and without evidence of hematoma. The patient subsequently  began to develop hiccups the day after the operation as well as swelling in  the right neck which progressed towards the sternum. The patient denies  dyspnea but has had some hoarseness of his voice but did not know if this  was new since  the swelling began in his neck or since the operation. The  patient also had right sided neck pain as well as headache. The patient  presented to the emergency room where he was evaluated by Dr. Fabienne Bruns. CT scan of the neck revealed what appeared to be a large hematoma  with some compression of the airway. The patient also had intractable  hiccups. Due to these findings, it was Dr. Lisbeth Renshaw opinion that the patient  should undergo evacuation of the right neck hematoma. The patient was  admitted at that time.   HOSPITAL COURSE:  The patient presented to the emergency department at [facility] on May 08, 2003, postoperative day #4, status post right  carotid endarterectomy. The patient presented with right neck swelling and  intractable hiccups as well as complaint of some hoarseness. The patient was  evaluated by  Dr. Fabienne Bruns who had a CT scan performed. CT scan  revealed large hematoma with some compression of the airway. The patient was  admitted and taken to the OR on May 09, 2003 for evacuation of the  right neck hematoma. The patient tolerated the procedure well and was  hemodynamically stable immediately postoperatively. The patient was  extubated without complications and woke up from anesthesia neurologically  intact. A stat gram stain of fluid removed from the right neck revealed  mononuclear cells and no bacteria. The patient's postoperative course has  progressed as expected. The patient is doing well on postoperative day #1  with resolution of the hiccups as well as hoarseness. The patient was placed  on IV antibiotics until the culture results could be obtained. The cultures  have remained negative, and the patient has continued to remain stable. The  patient was maintained on IV antibiotics until the date of discharge. On the  date of discharge which is postoperative day #3, the patient was without  complaint with no hiccups. The patient was  afebrile, and vital signs were  stable. The neck incision was clean, and there was minimal edema present of  the right side of the neck. The patient was felt to be stable for discharge  at this time. The patient will be sent home on Keflex for two weeks and  followup with Dr. Madilyn Fireman in one week.   LABORATORY DATA:  CBC on May 10, 2003:  Hemoglobin 11.8, hematocrit  33.6, white count 9.8, platelets 302. BMP on May 08, 2003:  Sodium 136,  potassium 3.6, BUN 9, creatinine 0.9, glucose 158, calcium 9.   CONDITION ON DISCHARGE:  Improved.   MEDICATIONS:  1. Prozac 20 mg p.o. q.d.  2. Nexium 20 mg p.o. q.d.  3. Multivitamin p.o. q.d.  4. Vitamin C p.o. q.d.  5. Aspirin 81 mg two p.o. q.d.  6. The patient will be given a prescription for Keflex 500 mg one p.o.     b.i.d. for two weeks.  7. Tylox one to p.o. q.4-6h. p.r.n. pain.   ACTIVITY:  The patient is to refrain driving, heavy lifting, and strenuous  activity.   DIET:  Same restrictions as his preoperative diet.   WOUND CARE:  The patient may shower daily and clean incision with soap and  water.   FOLLOW UP:  The patient has a followup appointment with Dr. Madilyn Fireman in one  week. The [facility] office will call the patient with that appointment.      [name], PA                      Charles E. Darrick Penna, MD    AY/MEDQ  D:  05/12/2003  T:  05/12/2003  Job:  [id number]

## 2010-08-05 NOTE — Discharge Summary (Signed)
NAME:  [name], [name]                        ACCOUNT NO.:  [id number]   MEDICAL RECORD NO.:  [id number]                   PATIENT TYPE:  INP   LOCATION:  3313                                 FACILITY:  [facility]   PHYSICIAN:  Balinda Quails, M.D.                 DATE OF BIRTH:  [birth year]   DATE OF ADMISSION:  05/05/2003  DATE OF DISCHARGE:  05/06/2003                                 DISCHARGE SUMMARY   ADMISSION DIAGNOSIS:  Right carotid artery disease.   DISCHARGE DIAGNOSIS:  Right internal carotid artery stenosis status post  right carotid endarterectomy with Dacron patch angioplasty.   PAST MEDICAL HISTORY:  1. Carotid artery disease.  2. Depression.  3. Hyperlipidemia.  4. Chronic weakness in the left leg and right arm which he attributes to his     previous neck and lower back surgeries.  5. Peripheral neuropathy status post back surgery.  6. History of hyperglycemia without definitive diagnosis of diabetes.  7. History of persistent cough with questionable etiology, believed to be     secondary to sinus drainage versus reflux.  The patient was recently     started on Nexium and has stated that the cough has improved.   PAST SURGICAL HISTORY:  1. Appendectomy in 1951.  2. Tonsillectomy in 1956.  3. Cervical spine surgery in 1987.  4. Back surgery x2 in 1996 and 1997.   ALLERGIES:  No known drug allergies.   HISTORY OF PRESENT ILLNESS:  The patient is a 75 year old Caucasian male who  was referred to Dr. Madilyn Fireman by Dr. [name] for evaluation of his right  carotid artery disease.  The patient had a history of hyperlipidemia and  joined a  cholesterol study at [facility].  During the initial evaluation, the patient was found to have right  internal carotid artery stenosis by Doppler.  Based on these findings, the  patient was dismissed from the study and referred to Dr. Anne Hahn.  Dr. Anne Hahn  performed additional testing which  included an MRI and an MRA of the brain  on March 23, 2003.  MRI results were most consistent with microangiopathic  changes and the MRI results revealed moderate stenosis of the distal M1  segment of the right middle cerebral artery  as well as mild atheromatous  narrowing of the peripheral branches of the left middle and both posterior  cerebral arteries.  Due to these findings and the right carotid artery  stenosis, the patient was referred to Dr. Madilyn Fireman for consideration of  possible carotid endarterectomy.  He was seen by Dr. Madilyn Fireman on April 13, 2003, and a repeat carotid Duplex was performed which confirmed his previous  results and revealed an 80 to 99% right internal carotid artery stenosis.  There was no significant left internal carotid artery disease.  Due to the  severity of the right carotid artery disease,  Dr. Madilyn Fireman felt that the  patient would benefit from right carotid endarterectomy.  Prior to  scheduling of the surgery, the patient was scheduled for an MRA of the neck  to further evaluate his neck carotid anatomy.  This was performed on  April 21, 2003.  The patient had been relatively asymptomatic of his  carotid artery disease.   HOSPITAL COURSE:  The patient was admitted and taken to the OR on May 05, 2003, for a right carotid endarterectomy with Dacron patch angioplasty.  Significant findings during the procedure, a biopsy was taken of a right  prominent carotid body which frozen section revealed to be benign.  The  patient tolerated the procedure well and was hemodynamically stable  immediately postoperatively.  The patient was extubated without problem and  woke up from anesthesia neurology intact.  The patient was transferred from  the OR to the PACU in stable condition.  The patient's postoperative course  has progressed as expected.   On postoperative day #1, the day of discharge, the patient was without  complaints.  He was eating and ambulating  without complications.  The  patient's vital signs were stable and he was afebrile.   On physical examination, cardiac was regular rate and rhythm.  Lungs:  There  were crackles present in the right base.  Abdomen was soft, nontender and  nondistended.  There were positive bowel sounds.  The incision was clean,  dry and intact and there was no evidence of hematoma and the patient was  neurologically intact.   The patient is felt to be stable for discharge at this time.   LABORATORY DATA:  CBC on May 06, 2003, white count 12, hemoglobin 10.9,  hematocrit 31.5, platelet count 256.  BMP on May 06, 2003, sodium 131,  potassium 3.8, BUN 8, creatinine 0.7, glucose 183.   CONDITION ON DISCHARGE:  Improved.   DISCHARGE INSTRUCTIONS:  1. The patient is to resume his preoperative medications which include:     a. Prozac 20 mg one p.o. daily.     b. Multivitamin one p.o. daily.     c. Vitamin C 1500 mg p.o. daily.     d. Aspirin 81 mg two p.o. daily.     e. Nexium 40 mg one p.o. daily.  2. Pain management:  Tylox one to two p.o. q.4-6h. p.r.n.  3. Activity:  No driving, no strenuous activity and the patient is to     continue daily breathing and walking exercises.  4. Diet:  Heart healthy.  5. Wound care:  The patient may shower daily and clean the incisions with     soap and water.  If the incisions become red, swollen or drain, or if the     patient has a fever of 101 degrees F, he is to call the CVTS office at     [phone].  6. Follow-up appointment:  The patient should follow up with Dr. Madilyn Fireman in     three weeks.  The [facility] office will call the patient with that     appointment.      [name], [place]                      Balinda Quails, M.D.   AY/MEDQ  D:  05/06/2003  T:  05/06/2003  Job:  [id number]   cc:   [name], M.D.  [address]  [address]  [place]  [place] [address]  Fax: [phone]

## 2010-08-05 NOTE — H&P (Signed)
NAME:  [name], [name]                        ACCOUNT NO.:  [id number]   MEDICAL RECORD NO.:  [id number]                   PATIENT TYPE:  INP   LOCATION:  NA                                   FACILITY:  [facility]   PHYSICIAN:  Balinda Quails, M.D.                 DATE OF BIRTH:  1936/03/08   DATE OF ADMISSION:  DATE OF DISCHARGE:                                HISTORY & PHYSICAL   ANTICIPATED DATE OF ADMISSION:  May 05, 2003   PRIMARY CARE PHYSICIAN:  Gwen Pounds, M.D.   NEUROLOGIST:  [name], M.D.   CHIEF COMPLAINT:  Right carotid artery stenosis.   HISTORY OF PRESENT ILLNESS:  [name] is a 75 year old Caucasian male  who was referred to Dr. Balinda Quails by Dr. [name] for evaluation  of his right carotid artery disease.  Apparently he has a history of  hyperlipidemia and joined a cholesterol study at [facility]  During his initial evaluation, he was found to have  right internal carotid artery stenosis by Doppler.  Based on these findings,  he was dismissed from the study and was referred to Dr. Anne Hahn.  Dr. Anne Hahn  did perform additional testing which included an MRI and MRA of the brain,  which were done on March 23, 2003.  MRI results were most consistent with  microangiopathic changes and the MRA results revealed moderate stenosis of  the distal M1 segment of the right middle cerebral artery as well as mild  atheromatous narrowing of the peripheral branches of the left middle and  both posterior cerebral arteries.  Based on these findings and his right  carotid artery stenosis, he was referred to Dr. Madilyn Fireman for consideration of  possible carotid endarterectomy.  He was seen by Dr. Madilyn Fireman on April 13, 2003.  The carotid duplex was done, which did confirm his previous results  and showed 80 to 99% right internal carotid artery stenosis with velocities  at 478/125.  There was no significant left internal carotid  artery stenosis  noted.  Based on the severity of his right carotid artery disease, Dr. Madilyn Fireman  did feel that Mr. Andres would benefit from a right carotid  endarterectomy for reduction of stroke risk.  Prior to scheduling this  examination, he was scheduled for an MRA of the neck to further evaluate his  neck carotid anatomy.  This was done on April 21, 2003.  Subsequently his  surgery was tentatively scheduled for May 05, 2003 at [facility].   [name] has been relatively asymptomatic of his carotid artery  disease.  He denies nausea, vomiting, dizziness, seizures, speech  impairment, dysphagia, visual changes, syncope or memory changes as well as  history of CVA or transient ischemic attacks.  He does report mild chronic  weakness in his left leg and right arm, which he  attributes to his previous  spinal surgeries on his back and neck.  He also has peripheral neuropathy  secondary to his lower back surgeries.  In addition, he does report that  over the last week, he has had intermittent right hand numbness upon  awakening, which resolves when he begins carrying out his normal daily  activities.  He also reports occasional headaches.   PAST MEDICAL HISTORY:  1. Carotid artery disease, right.  2. Depression.  3. Hyperlipidemia.  4. Chronic weakness in his left leg and right arm, which he attributes to     his previous neck and lower back surgeries.  5. Peripheral neuropathy, status post back surgery.  6. History of hyperglycemia without definitive diagnosis of diabetes.  7. History of persistent cough with questionable etiology.  Believed to be     secondary to sinus drainage versus reflux.  He was started on Nexium     recently and says that his cough has improved.   PAST SURGICAL HISTORY:  1. Appendectomy 1951.  2. Tonsillectomy 1956.  3. Cervical spine surgery in 1987.  4. Back surgery times two in 1996 and in 1997.   MEDICATIONS:  1. Prozac 20 mg  one p.o. q. d.  2. Multivitamin one p.o. q. d.  3. Vitamin C 1500 mg p.o. q. d.  4. Aspirin 81 mg two tablets p.o. q. d.  5. Nexium 40 mg one p.o. q. d.   ALLERGIES:  No known drug allergies.   REVIEW OF SYMPTOMS:  Please see history of present illness for significant  positive's and negative's.  In addition, he does wear glasses.  He also  reports nocturia with reported urination one to two times every night.  He  denies any history of hematochezia or kidney disease.   FAMILY HISTORY:  His family history is significant for diabetes and coronary  artery disease in his mother and a history of stroke in his father.   SOCIAL HISTORY:  He is married with two children.  He is a Armed forces logistics/support/administrative officer.  He denies alcohol or tobacco use.   PHYSICAL EXAMINATION:  VITAL SIGNS:  His blood pressure is 168/74 in his  left arm and 164/70 in his right arm; his pulse is 80; his respirations are  18.  GENERAL APPEARANCE:  This is a 75 year old Caucasian male who appears  younger than his stated age.  He is alert, cooperative and in no acute  distress, although he appears somewhat anxious.  HEENT:  His head is normocephalic and atraumatic.  His pupils are equal,  round and reactive to light and accommodation.  His sclerae are nonicteric.  His mucous membranes are pink and moist.  He has fairly good dentition.  No  oral lesions or erythema are noted.  NECK:  His neck is supple.  No JVD, thyromegaly or lymphadenopathy is noted.  He does have 2+ carotid pulses with a bruit on the right.  CHEST:  His chest is symmetric in inspiration.  His lung sounds are clear  throughout.  No wheezes, rhonchi or rales are noted.  CARDIOVASCULAR:  His heart has a regular rate and rhythm.  No murmur, rub or  gallop is noted.  ABDOMEN:  His abdomen is soft, nontender and nondistended.  He has  normoactive bowel sounds times four.  No masses or hepatosplenomegaly are  noted. GENITOURINARY/RECTAL:  These examinations are  deferred.  EXTREMITIES:  Warm and dry.  No edema, clubbing or ulcerations are noted.  He does have 2+  posterior tibial and radial pulses bilaterally.  NEUROLOGIC:  His neurologic examination is grossly intact.  His speech is  clear.  His gait is steady.  He is alert and oriented times four.  His  bilateral upper and lower muscle strength is 5/5 bilaterally.   IMPRESSION:  Mr. Bracamonte is a 75 year old Caucasian male with right  carotid artery stenosis.   PLAN:  He will be electively admitted to [facility] on May 05, 2003 where he will undergo a right carotid endarterectomy for reduction of  stroke risk by Dr. Balinda Quails.  Dr. Madilyn Fireman has seen and evaluated this  patient prior to this admission and has explained the risks and benefits of  the procedure and the patient has agreed to proceed.      Jerold Coombe, P.A.                  Balinda Quails, M.D.    AWZ/MEDQ  D:  05/01/2003  T:  05/01/2003  Job:  [id number]   cc:   Balinda Quails, M.D.  [address]  [place]  [place] [address]   Gwen Pounds, M.D.  [address]  [place]  [place] [address]  Fax: [phone]   C. Lesia Sago, M.D.  [address]  [address]  [place]  [place] [address]  Fax: [phone]

## 2010-08-05 NOTE — Consult Note (Signed)
NAME:  [name], [name]                        ACCOUNT NO.:  [id number]   MEDICAL RECORD NO.:  [id number]                   PATIENT TYPE:  INP   LOCATION:  3742                                 FACILITY:  [facility]   PHYSICIAN:  Pramod P. Pearlean Brownie, MD                 DATE OF BIRTH:  [birth year]   DATE OF CONSULTATION:  06/26/2003  DATE OF DISCHARGE:                                   CONSULTATION   REASON FOR REFERRAL:  Headache.   HISTORY OF PRESENT ILLNESS:  Mr. Keadle is a 75 year old male who was  having new onset of right hemicranial headaches for the last four weeks or  so.  Patient had elective right carotid endarterectomy for symptomatic high  grade right ICA stenosis on May 05, 2003.  He was re-hospitalized a few  days later for hematoma at the surgical site which had to be evacuated.  A  few days later he noticed severe headaches, nausea, vomiting with blurred  vision.  He was seen in the ER with elevated blood pressure.  He was treated  with labetalol and blood pressure came down to 153/67.  Since then the  headache has continued.  He describes the headache as starting off mainly in  the right frontal region but involving the whole right side of the body.  The headache is 10/10 in severity, throbbing, sharp in nature.  He  complained about nausea and vomiting but no photophobia or phonophobia.  The  headache is continuous.  He has been on Percocet q.4 hourly in the hospital  and states it works quite well and the headache severity comes down to 2/10,  but the effect does not last.  He has also been started on amitriptyline 25  mg at bedtime two days ago which he has tolerated well so far.  The patient  has read about hyperperfusion syndrome and seems quite fixated on the idea  that he has this.  He denies any prior history of migraine headaches, though  he admits to occasional headaches and clearly states that the present  headaches are not the same.  He has no known prior  neurological history.   PAST MEDICAL HISTORY:  Significant for:  1. Borderline diabetes.  2. Hyperlipidemia.  3. Depression.  4. Gastroesophageal reflux disease with known esophageal stricture.   PAST SURGICAL HISTORY:  1. Right carotid endarterectomy on May 05, 2003, by Dr. Madilyn Fireman.  2. Lumbosacral spine surgery.  3. C-spine surgery.  4. Appendectomy.  5. Tonsillectomy.   MEDICATION ALLERGIES:  NONE KNOWN.   HOME MEDICATIONS:  Prozac, Nexium, multivitamin, vitamin C, aspirin and  Tylox.   FAMILY HISTORY:  His father died of a stroke in the 88s.   SOCIAL HISTORY:  Patient is married, quit smoking in 1980, does not drink  alcohol.  He is a Wellsite geologist.   PHYSICAL EXAMINATION:  Reveals an obese, middle aged gentleman not  in  distress or febrile.  His pulse rate is 88 per minute, regular.  Blood  pressure at present is 158/70.  Distal pulses are well felt.  HEAD:  Is nontraumatic.  NECK:  Is supple without bruit.  ENT EXAM:  Unremarkable.  NEUROLOGICAL EXAM:  Patient is awake, alert, oriented x3 with normal speech  and language function.  There is no aphasia, apraxia or dysarthria. Pupils  are equal, reactive.  Eye movements are full range without nystagmus.  Face  is symmetric, thyroid otherwise normal.  The tongue is midline.  MOTOR SYSTEM EXAM:  Reveals symmetric upper and lower extremity strength,  tone, reflexes, coordination and sensation.  Plantars are both downgoing.  Ankle jerks elicited, knee jerks are depressed.   DATA:  Reviewed CT scan of the head, noncontrast study, done on June 16, 2003, is unremarkable without any acute pathology seen.   IMPRESSION:  75 year-old gentleman with new onset of right  hemicranial chronic daily headaches for the last four weeks following  carotid endarterectomy.  Possibilities include hyperperfusion syndrome as  the most likely possibility, though lack of hemorrhage and focal seizures  would go against it.   However, he has clearly had elevated blood pressure,  would support the diagnosis.  Other possibilities would include temporal  arthritis, chronic paroxysmal hemicranial headaches.   PLAN:  1. I agree with aggressive blood pressure control.  2. Will start the patient on Altace 2.5 mg a day in a.m. for systolic blood     pressure below 150 at least.  3. Continue pain medications, Percocet two tablets q.6 hourly as needed for     headaches and amitriptyline 25 mg at bedtime.  4. Check ESR, C3, C4 and CH50 levels.  5. MRI scan of the brain with and without contrast to evaluate for any     abnormal meningeal announcement or structural lesions.  6. If the patient's headaches do not improve over the next week or two,     despite strict control of blood pressure, may consider alternative     treatment with a trial of indomethacin 75 mg three times a day in three     divided doses for paroxysmal hemicranial headache.  7. If the ESR is elevated may continue steroid high doses for treatment of     degenerative arthritis and proceed with temporal artery biopsy.   I would be happy to follow the patient in consult and call for questions.                                               Pramod P. Pearlean Brownie, MD    PPS/MEDQ  D:  06/26/2003  T:  06/28/2003  Job:  [id number]

## 2010-08-05 NOTE — Op Note (Signed)
NAMECORMAC, WINT              ACCOUNT NO.:  [id number]   MEDICAL RECORD NO.:  [id number]          PATIENT TYPE:  INP   LOCATION:  3172                         FACILITY:  [facility]   PHYSICIAN:  Stefani Dama, M.D.  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   PROCEDURE:  Anterior cervical decompression, C4-5, C5-6, C6-7.  Arthrodesis  with allograft and PEEK bone spacer.  Fixation with anterior cervical plate,  Z6-1, W9-6 and C6-7.   SURGEON:  Stefani Dama, M.D.   FIRST ASSISTANT:  Danae Orleans. Venetia Maxon, M.D.   ANESTHESIA:  General endotracheal.   INDICATIONS:  Margaret Staggs is a 75 year old individual, who has had  significant neck, shoulder and bilateral arm pain.  He has had some weakness  in the right arm.  He has been having increasing symptoms with numbness in  the upper extremities and he has been advised regarding surgical  decompression at the levels of C4-5, C5-6 and C6-7.   DESCRIPTION OF PROCEDURE:  The patient was brought to the operating room,  and placed on the table in supine position.  After smooth induction of  general endotracheal anesthesia, he was placed in 5 pounds of Holter  traction.  The neck was prepped with DuraPrep and draped in sterile fashion.  A transverse incision was created on the left side of the neck and this was  carried down through the platysma.  The plane between the  sternocleidomastoid and the strap muscles was dissected bluntly until the  prevertebral space was reached.  The first identifiable disc space was noted  to be that of C5-C6 on localizing radiograph, then by stripping the longus  colli muscle on either side of the midline, a self-retaining  retractor could  be placed into the wound, and dissection was carried up to C4-C5.  At C4-C5,  a discectomy was completed, removing a substantial quantity of severely  degenerated disc material and large bony osteophytic material from the  inferior margin of the body of C4 and out into the regions of the facet on  either side.  Large osteophytic complexes were taken down from the lateral  recesses.  Once this was decompressed, hemostasis was obtained in the  epidural space.  The nerve roots were ascertained.  They were well-  decompressed, far out laterally, and then the interspace was sized for  appropriate-sized spacer.  It was felt that a 6 mm medium-sized PEEK spacer  would fit appropriately.  This was packed with the patient's own bone, mixed  with 1 cc of Osteocel and 5 cc of demineralized bone matrix.  The first  spacer was placed at the C4-C5 level.   The next level that was decompressed was at C5-C6.  In this area, even  larger osteophytic overgrowths were encountered.  On the right side, there  was  epidural fibrosis from previous posterior discectomy that the patient  had had, even larger uncinate spurs were encountered at the C5-C6 level.  This level required the longest time to decompress, as the bony overgrowths  and the older scar tissue slowed the procedure considerably.  In the end,  however, lateral recesses were well-decompressed.  Central canal was  decompressed and the interspace at the bony end plates shaved smoothly and  it was sized for a 6 mm large-sized graft.  This was filled with the same  combination of the patient's own bone, Osteocel and demineralized bone  matrix.   Attention was then turned to C6-C7, where a similar procedure was carried  out.  However, here the uncinate spurs were smaller and centrally there was  more noted to be a central bulge of the disc.  The ligaments were cut down  and removed to allow for the decompression.  Once decompression  was  obtained, interspace was sized for a large, 6 mm spacer, which was again  filled with the same combination of material.  Then the traction was  removed.   The neck was inspected and a 54 mm standard sized Alphatec plate was fitted  with variable angle locking screws placed in C4 and C7.  Fixed angle, 14 mm  locking screws were placed in C5 and C6.  Soft-tissue hemostasis was then  obtained in the prevertebral space and a final localizing radiograph  identified good position of the surgical construct.  Then the wound was  closed carefully with 3-0 Vicryl in the platysma and 3-0 Vicryl in the  subcuticular tissues, and a Dermabond in the skin.   The patient tolerated the procedure well and was returned to the recovery  room in stable condition.      Stefani Dama, M.D.  Electronically Signed     HJE/MEDQ  D:  08/22/2005  T:  08/22/2005  Job:  [id number]

## 2010-08-05 NOTE — Op Note (Signed)
NAME:  JOBE, MUTCH                        ACCOUNT NO.:  [id number]   MEDICAL RECORD NO.:  [id number]                   PATIENT TYPE:  INP   LOCATION:  3002                                 FACILITY:  [facility]   PHYSICIAN:  Janetta Hora. Fields, MD               DATE OF BIRTH:  1935-10-31   DATE OF PROCEDURE:  05/09/2003  DATE OF DISCHARGE:                                 OPERATIVE REPORT   PROCEDURE:  Evacuation of hematoma, right neck.   PREOPERATIVE DIAGNOSIS:  Hematoma right neck, status post carotid  endarterectomy.   POSTOPERATIVE DIAGNOSIS:  Hematoma right neck, status post carotid  endarterectomy.   ANESTHESIA:  General.   ASSISTANT:  Nurse.   INDICATIONS:  The patient is a 75 year old male who is postoperative day #4  from a right carotid endarterectomy.  He presented to the emergency room  with a large mass in his right neck.  A CT scan of the neck showed, what  appeared to be, a large hematoma with some compression of the airway. The  patient has also had tractable hiccups.  The patient was informed of the  risks, benefits and possible complications of evacuating a hematoma in the  neck; including, but not limited to:  bleeding, infection of the prosthetic  patch, and injury of cranial nerves.   FINDINGS:  1. Hematoma of right neck.  2. Culture of yellowish liquid material and Gram's-stain; with no bacteria     found.   OPERATIVE DETAILS:  After obtaining informed consent, the patient was taken  to the operating room.  The patient was placed in the supine position on the  operating room table.  Next, the patient's entire right neck were prepped  and draped in the usual sterile fashion.  Local anesthesia was infiltrated  in the right neck incision, and the old incision was reopened.  The patient  began to cough at this point and the attempt at a local procedure was  aborted.  The patient was then given general anesthetic.   Next, the incision was carried down  through the subcutaneous tissues.  The  old suture line through the platysma was taken down.  The old suture line  reapproximated in the sternocleidomastoid muscle to the anterior neck was  taken down.  At this point, there was approximately 3 cc of a yellowish  fluid material mixed with hematoma.  This had the appearance of organizing  or resolving hematoma.  This was sent for a stat Gram's-stain, which came  back with mononuclear cells, but no polynuclear cells and no bacteria.  The  dissection was carried down to the level of the patch.  There was found to  be no active bleeding.  The hematoma was completely evacuated.  The neck  incision and patch were then thoroughly irrigated with 3 L of normal saline  solution.  Hemostasis was obtained.   Next, the platysma muscle was reapproximated  using a running Vicryl suture.  The skin was then closed with a 4-0 Vicryl subcuticular stitch.   Instrument, sponge and needle count was correct at the end of the case.  The  patient was taken to the recovery room in stable condition.                                               Janetta Hora. Fields, MD    CEF/MEDQ  D:  05/09/2003  T:  05/10/2003  Job:  [id number]

## 2010-08-05 NOTE — Op Note (Signed)
NAME:  [name], TOME NO.:  [id number]   MEDICAL RECORD NO.:  [id number]          PATIENT TYPE:  EMS   LOCATION:  MAJO                         FACILITY:  [facility]   PHYSICIAN:  Danise Edge, M.D.   DATE OF BIRTH:  1936/03/01   DATE OF PROCEDURE:  DATE OF DISCHARGE:                                 OPERATIVE REPORT   PROCEDURE:  Esophagogastroduodenoscopy   PROCEDURE INDICATIONS:  Mr. Yadier Bramhall is a 75 year old male born  04/22/1935.  Mr. Bertz has undergone esophageal dilation in the  past to dilate a benign distal esophageal stricture.   Approximately 5 hours ago, Mr. Kleinert was eating ribs and a piece of meat  lodged in the esophagus.  He has remained obstructed.   ENDOSCOPIST:  Danise Edge, M.D.   PREMEDICATION:  Versed 5 mg, fentanyl 50 mcg.   PROCEDURE:  After obtaining informed consent, Mr. Betten was placed in  the left lateral decubitus position.  I administered intravenous fentanyl  and intravenous Versed to achieve conscious sedation for the procedure.  The  patient's blood pressure, oxygen saturation and cardiac rhythm were  monitored throughout the procedure and documented in the medical record.   The Olympus gastroscope was passed through the posterior hypopharynx and  into the proximal esophagus without difficulty.  The hypopharynx, larynx and  vocal cords appeared normal.   ESOPHAGOSCOPY:  The proximal and mid segments of the esophagus appeared  normal.  There is fluid extending to the mid-esophagus.  The fluid was  suctioned, revealing a meat bolus obstructing the distal esophagus.  With  gentle pressure, the meat bolus was pushed into the stomach.  The distal  esophageal mucosa is friable and edematous.  There were no mucosal tears as  a result of pushing the meat into the stomach.   GASTROSCOPY:  The cardia, fundus and gastric body are filled with liquid and  solid food.  A retroflexed view of the cardia and fundus  was not possible.  The gastric antrum and pylorus appear normal.   DUODENOSCOPY:  The duodenal bulb and descending duodenum appear normal.   ASSESSMENT:  Obstructing food bolus in the distal esophagus relieved by  pushing the obstructing meat into the stomach.   PLAN:  I will schedule Mr. Canelo for an elective esophageal dilation.           ______________________________  Danise Edge, M.D.     MJ/MEDQ  D:  08/14/2005  T:  08/15/2005  Job:  [id number]   cc:   Stefani Dama, M.D.  Fax: [phone]

## 2010-08-05 NOTE — Discharge Summary (Signed)
NAME:  [name], [name]                        ACCOUNT NO.:  [id number]   MEDICAL RECORD NO.:  [id number]                   PATIENT TYPE:  INP   LOCATION:  3742                                 FACILITY:  [facility]   PHYSICIAN:  Balinda Quails, M.D.                 DATE OF BIRTH:  Aug 18, 1935   DATE OF ADMISSION:  06/24/2003  DATE OF DISCHARGE:  06/29/2003                                 DISCHARGE SUMMARY   ADMISSION DIAGNOSIS:  Hyperperfusion syndrome, status post right carotid  endarterectomy.   DISCHARGE DIAGNOSIS:  Hyperperfusion syndrome, status post right carotid  endarterectomy.   PAST MEDICAL HISTORY:  1. ECCBOD, status post carotid endarterectomy May 05, 2003.  2. Drainage of hematoma right neck May 08, 2003.  3. Dyslipidemia.  4. Impaired glucose tolerance.  5. Depression.   PAST SURGICAL HISTORY:  1. Cervical spine surgery.  2. Lumbar spine surgery.  3. Appendectomy.  4. Tonsillectomy.   ALLERGIES:  No known drug allergies.   HISTORY OF PRESENT ILLNESS:  [name] is a 75 year old Caucasian male  status post right carotid endarterectomy July 03, 2003, at [facility] by Dr. Madilyn Fireman.  Following discharge, he was returned to the  hospital on May 08, 2003, for drainage of a hematoma of the left neck.  Since the time of surgery, he has had a mild right-sided headache.  This was  centered behind his right eye.  He saw Dr. Cecilie Kicks the week  prior to  admission who performed a CT of the head.  This was negative for any acute  abnormalities.  He was also having some blurred vision with his headache.  Headache was also associated with nausea and vomiting.  He presented to the  [facility] Emergency Department the evening of June 24, 2003.  His headache  was now severe and on admission, his blood pressure was elevated at 234/88.  Dr. Madilyn Fireman was consulted and evaluated [name] in the emergency  department.  His impression is that of  probable hyperperfusion syndrome.  His recommendation was for admission to the hospital for blood pressure  control as well as initiation of prednisone to help resolve his headache.   HOSPITAL COURSE:  On June 24, 2003, [name] was admitted to [facility]  [facility] in the care of Dr. Balinda Quails.  Internal medicine  consultation was requested on June 25, 2003, since [name] did not  have any primary care Aashritha Miedema here in [place], [place].  He was  seen in consultation by Dr. Valentina Lucks.  He has followed Mr. Bevens  progress throughout his hospitalization and plans to follow him up within  one week of discharge for control of blood pressure and following of his  impaired glucose tolerance.   [name] headaches improved over the next several days with a  combination of prednisone and Elavil.  His blood pressure  remained stable  and he was started on a low dose of Altace.   June 26, 2003, neurology consult was requested and he was evaluated by Dr.  Anne Hahn.  His recommendation was for an MRI of the brain.  This was performed  on June 27, 2003, and there was no evidence of acute infarct or intracranial  hemorrhage.  No abnormal intracranial enhancing lesions.  Dr. Anne Hahn  recommended tapering down his prednisone if his headaches improved.   June 28, 2003, Mr. Kotowski reported no headache today.  His blood  pressure remained stable at 140/60.  He remains neurologically intact.  If  [name] remains stable and remains headache-free, it is anticipated he  will be ready for discharge home in the next 24 to 48 hours.   LABORATORY DATA:  June 25, 2003, white blood cells 10.4, hemoglobin 11.6,  hematocrit 33.9, platelets 329.  Chemistries included sodium 139, potassium  3.6, BUN 14, creatinine 0.8, glucose 161.  Hemoglobin A1C 7.2.  Total  cholesterol 232, triglycerides 233, HDL 47, LDL 138.   CONDITION ON DISCHARGE:  Improved.   DISCHARGE  INSTRUCTIONS:  1. Medications:     a. Altace 2.5 mg daily.     b. Zocor 20 mg each evening.     c. Amitriptyline 25 mg q.h.s.     d. Prednisone 5 mg daily through July 02, 2003.     e. He is to resume his home medication of aspirin 325 mg daily, this is a        new dose for him.     f. Prozac 20 mg daily.  2. For pain management, he may have Tylox one to two p.o. q.4-6h. p.r.n.  3. Activity is as tolerated.  He is advised to refrain from any heavy     lifting.  4. His diet should be a low fat, low salt diet.  5. Follow-up:     a. Dr. Valentina Lucks would like to see him in his office in approximately one        week. [name] has been given the        office phone number and asked to arrange that appointment.     b. Dr. Madilyn Fireman would like to see him back at the [facility] office in about three        weeks.  The office will call with date and time for that appointment.      Toribio Harbour, N.P.                  Balinda Quails, M.D.    CTK/MEDQ  D:  06/28/2003  T:  06/29/2003  Job:  [id number]   cc:   Thora Lance, M.D.  [address]  [place]  [place] [address]  Fax: [phone]   C. Lesia Sago, M.D.  [address]  [address]  [place]  [place] [address]  Fax: [phone]

## 2010-08-23 HISTORY — PX: MITRAL VALVE REPAIR: SHX2039

## 2010-08-29 HISTORY — PX: CORONARY ARTERY BYPASS GRAFT: SHX141

## 2010-08-29 HISTORY — PX: AORTIC VALVE REPLACEMENT: SHX41

## 2010-09-03 ENCOUNTER — Emergency Department (HOSPITAL_COMMUNITY)
Admission: EM | Admit: 2010-09-03 | Discharge: 2010-09-03 | Disposition: A | Discharge status: Home / Self Care | Payer: Medicare Other | Attending: Emergency Medicine | Admitting: Emergency Medicine

## 2010-09-03 DIAGNOSIS — I10 Essential (primary) hypertension: Secondary | ICD-10-CM | POA: Insufficient documentation

## 2010-09-03 DIAGNOSIS — L02419 Cutaneous abscess of limb, unspecified: Secondary | ICD-10-CM | POA: Insufficient documentation

## 2010-09-03 DIAGNOSIS — Z7982 Long term (current) use of aspirin: Secondary | ICD-10-CM | POA: Insufficient documentation

## 2010-09-03 DIAGNOSIS — Z951 Presence of aortocoronary bypass graft: Secondary | ICD-10-CM | POA: Insufficient documentation

## 2010-09-03 DIAGNOSIS — L03119 Cellulitis of unspecified part of limb: Secondary | ICD-10-CM | POA: Insufficient documentation

## 2010-09-03 DIAGNOSIS — G8918 Other acute postprocedural pain: Secondary | ICD-10-CM | POA: Insufficient documentation

## 2010-09-03 DIAGNOSIS — K219 Gastro-esophageal reflux disease without esophagitis: Secondary | ICD-10-CM | POA: Insufficient documentation

## 2010-09-03 DIAGNOSIS — E119 Type 2 diabetes mellitus without complications: Secondary | ICD-10-CM | POA: Insufficient documentation

## 2010-09-03 DIAGNOSIS — M79609 Pain in unspecified limb: Secondary | ICD-10-CM | POA: Insufficient documentation

## 2010-09-03 DIAGNOSIS — Z79899 Other long term (current) drug therapy: Secondary | ICD-10-CM | POA: Insufficient documentation

## 2010-10-17 ENCOUNTER — Other Ambulatory Visit: Payer: Self-pay

## 2010-10-17 ENCOUNTER — Encounter (HOSPITAL_COMMUNITY): Payer: Medicare Other | Attending: Pediatric Radiology

## 2010-10-17 ENCOUNTER — Encounter (HOSPITAL_COMMUNITY): Payer: Medicare Other

## 2010-10-17 DIAGNOSIS — Z5189 Encounter for other specified aftercare: Secondary | ICD-10-CM | POA: Insufficient documentation

## 2010-10-17 DIAGNOSIS — Z951 Presence of aortocoronary bypass graft: Secondary | ICD-10-CM | POA: Insufficient documentation

## 2010-10-17 DIAGNOSIS — I10 Essential (primary) hypertension: Secondary | ICD-10-CM | POA: Insufficient documentation

## 2010-10-17 DIAGNOSIS — E119 Type 2 diabetes mellitus without complications: Secondary | ICD-10-CM | POA: Insufficient documentation

## 2010-10-17 DIAGNOSIS — K219 Gastro-esophageal reflux disease without esophagitis: Secondary | ICD-10-CM | POA: Insufficient documentation

## 2010-10-17 DIAGNOSIS — I059 Rheumatic mitral valve disease, unspecified: Secondary | ICD-10-CM | POA: Insufficient documentation

## 2010-10-17 DIAGNOSIS — Z954 Presence of other heart-valve replacement: Secondary | ICD-10-CM | POA: Insufficient documentation

## 2010-10-17 DIAGNOSIS — I359 Nonrheumatic aortic valve disorder, unspecified: Secondary | ICD-10-CM | POA: Insufficient documentation

## 2010-10-19 ENCOUNTER — Other Ambulatory Visit: Payer: Self-pay

## 2010-10-19 ENCOUNTER — Encounter (HOSPITAL_COMMUNITY): Payer: Medicare Other | Attending: Pediatric Radiology

## 2010-10-19 DIAGNOSIS — Z951 Presence of aortocoronary bypass graft: Secondary | ICD-10-CM | POA: Insufficient documentation

## 2010-10-19 DIAGNOSIS — K219 Gastro-esophageal reflux disease without esophagitis: Secondary | ICD-10-CM | POA: Insufficient documentation

## 2010-10-19 DIAGNOSIS — I359 Nonrheumatic aortic valve disorder, unspecified: Secondary | ICD-10-CM | POA: Insufficient documentation

## 2010-10-19 DIAGNOSIS — E119 Type 2 diabetes mellitus without complications: Secondary | ICD-10-CM | POA: Insufficient documentation

## 2010-10-19 DIAGNOSIS — Z954 Presence of other heart-valve replacement: Secondary | ICD-10-CM | POA: Insufficient documentation

## 2010-10-19 DIAGNOSIS — I10 Essential (primary) hypertension: Secondary | ICD-10-CM | POA: Insufficient documentation

## 2010-10-19 DIAGNOSIS — Z5189 Encounter for other specified aftercare: Secondary | ICD-10-CM | POA: Insufficient documentation

## 2010-10-19 DIAGNOSIS — I059 Rheumatic mitral valve disease, unspecified: Secondary | ICD-10-CM | POA: Insufficient documentation

## 2010-10-19 LAB — GLUCOSE, CAPILLARY: Glucose-Capillary: 89 mg/dL (ref 70–99)

## 2010-10-20 LAB — GLUCOSE, CAPILLARY: Glucose-Capillary: 118 mg/dL — ABNORMAL HIGH (ref 70–99)

## 2010-10-21 ENCOUNTER — Other Ambulatory Visit: Payer: Self-pay

## 2010-10-21 ENCOUNTER — Encounter (HOSPITAL_COMMUNITY)
Admission: RE | Admit: 2010-10-21 | Discharge: 2010-10-21 | Payer: Medicare Other | Source: Ambulatory Visit | Attending: Pediatric Radiology | Admitting: Pediatric Radiology

## 2010-10-24 ENCOUNTER — Other Ambulatory Visit: Payer: Self-pay

## 2010-10-24 ENCOUNTER — Encounter (HOSPITAL_COMMUNITY): Payer: Medicare Other

## 2010-10-25 LAB — GLUCOSE, CAPILLARY: Glucose-Capillary: 101 mg/dL — ABNORMAL HIGH (ref 70–99)

## 2010-10-26 ENCOUNTER — Other Ambulatory Visit: Payer: Self-pay

## 2010-10-26 ENCOUNTER — Encounter (HOSPITAL_COMMUNITY): Payer: Medicare Other

## 2010-10-28 ENCOUNTER — Encounter (HOSPITAL_COMMUNITY): Payer: Medicare Other

## 2010-10-31 ENCOUNTER — Encounter (HOSPITAL_COMMUNITY): Payer: Medicare Other

## 2010-11-02 ENCOUNTER — Encounter (HOSPITAL_COMMUNITY): Payer: Medicare Other

## 2010-11-03 ENCOUNTER — Encounter: Payer: Self-pay | Admitting: *Deleted

## 2010-11-04 ENCOUNTER — Encounter (HOSPITAL_COMMUNITY): Payer: Medicare Other

## 2010-11-07 ENCOUNTER — Encounter (HOSPITAL_COMMUNITY): Payer: Medicare Other

## 2010-11-08 ENCOUNTER — Telehealth: Payer: Self-pay | Admitting: *Deleted

## 2010-11-08 ENCOUNTER — Ambulatory Visit (INDEPENDENT_AMBULATORY_CARE_PROVIDER_SITE_OTHER): Payer: Medicare Other | Admitting: Cardiovascular Disease

## 2010-11-08 ENCOUNTER — Encounter: Payer: Self-pay | Admitting: Cardiovascular Disease

## 2010-11-08 DIAGNOSIS — Z952 Presence of prosthetic heart valve: Secondary | ICD-10-CM | POA: Insufficient documentation

## 2010-11-08 DIAGNOSIS — Z9889 Other specified postprocedural states: Secondary | ICD-10-CM

## 2010-11-08 DIAGNOSIS — I2510 Atherosclerotic heart disease of native coronary artery without angina pectoris: Secondary | ICD-10-CM | POA: Insufficient documentation

## 2010-11-08 DIAGNOSIS — Z954 Presence of other heart-valve replacement: Secondary | ICD-10-CM

## 2010-11-08 NOTE — Telephone Encounter (Signed)
i called [facility] to get records they stated it was faxed 11/02/10 asked to resend.

## 2010-11-08 NOTE — Assessment & Plan Note (Signed)
He has a 28 mm mitral valve ring. He seems to be doing very well.

## 2010-11-08 NOTE — Telephone Encounter (Signed)
Message copied by Antony Odea on Tue Nov 08, 2010 12:31 PM ------      Message from: Antony Odea      Created: Tue Nov 08, 2010 10:59 AM       Mr fom [place] [phone]

## 2010-11-08 NOTE — Assessment & Plan Note (Signed)
He status post saphenous vein grafting to his posterior descending artery and second acute marginal artery.  He remains asymptomatic. Will continue to watch his lipid levels.

## 2010-11-08 NOTE — Progress Notes (Signed)
[name] Date of Birth  17-Mar-1936 [facility] [address]     [address] [place], [place]  [address] [phone]  Fax  [phone]  History of Present Illness:  75 -year-old gentleman with a history of coronary disease. He status post coronary artery bypass grafting as well as an aortic valve  replacement and mitral valve repair ( August 23, 2010)   He's been walking on a regular basis since his bypass surgery ( 3 miles a day).   He's also participating in cardiac rehabilitation.  His heart rate is been elevated since surgery. Dr. Valentina Lucks recently increased his metoprolol.  This is helped his tachycardia but he's having some occasional episodes of dizziness.     Current Outpatient Prescriptions  Medication Sig Dispense Refill  . amLODipine (NORVASC) 5 MG tablet Take by mouth daily. Take 1/2 Tab Daily       . aspirin 325 MG tablet Take 325 mg by mouth daily.        [name] FLUoxetine (PROZAC) 20 MG tablet Take 20 mg by mouth daily.        . Melatonin 3 MG CAPS Take by mouth.        . metFORMIN (GLUMETZA) 1000 MG (MOD) 24 hr tablet Take 1,000 mg by mouth 2 (two) times daily with a meal.        . metoprolol tartrate (LOPRESSOR) 25 MG tablet Take 25 mg by mouth 2 (two) times daily.        . Multiple Vitamin (MULTIVITAMIN PO) Take by mouth.        [name] omeprazole (PRILOSEC) 20 MG capsule Take 20 mg by mouth daily.        . rosuvastatin (CRESTOR) 10 MG tablet Take 10 mg by mouth daily.        . vitamin C (ASCORBIC ACID) 500 MG tablet Take 500 mg by mouth 2 (two) times daily.        [name] zolpidem (AMBIEN) 10 MG tablet Take 10 mg by mouth. Take 1/2 Tab Daily         No Known Allergies  Past Medical History  Diagnosis Date  . Hyperlipidemia   . Diabetes mellitus   . Mitral valve prolapse   . Aortic valve regurgitation   . Degenerative disk disease   . Major depression   . Spinal stenosis   . Coronary artery disease     post stents    Past  Surgical History  Procedure Date  . Appendectomy 1951  . Tonsillectomy 1956  . Adenoidectomy 1956  . Spine surgery 1987    1987 - 1996 - 1997 - multiple spinal surgeries both in the C-spine and the lumbar     . Carotid endarterectomy 05/05/2003    right carotid endarterectomy done in 2005  . Shoulder surgery 01/07/2007    Left shoulder impingement, labral tear  . Cardiac catheterization 10/20/2003    Est. EF of 65% -- Critical disease in the mid to distal circumflex --  Preserved left ventricular  function --   . Cardiac catheterization 10/20/2003    Percutaneous coronary intervention/drug-eluding stent implantation, third marginal branch --  Percutaneous closure, right femoral artery -- Atherosclerotic coronary vascular disease, single vessel / Status post successful percutaneous coronary intervention/tandem drug -- eluding stent implantation proximal third marginal branch -- Typical angina was not reproduced with device insertion or balloon   . Hematoma evacuation 05/09/2003    Evacuation of hematoma, right neck  .  Coronary artery bypass graft 08/29/2010    SVG to PDA, SVG to OM2- [facility]  . Aortic valve replacement 08/29/2010    25 mm CE Magna bioprosthetic - [facility]  . Mitral valve repair 08/29/2010    28 mm Simulus ring -[facility]     History  Smoking status  . Former Smoker -- 1.5 packs/day for 35 years  . Types: Cigarettes  . Quit date: 03/20/1984  Smokeless tobacco  . Never Used    History  Alcohol Use No    Family History  Problem Relation Age of Onset  . Diabetes Mother   . Stroke Father   . Coronary artery disease Mother     Reviw of Systems:  Reviewed in the HPI.  All other systems are negative.  Physical Exam: BP 138/82  Pulse 80  Ht 5\' 6"  (1.676 m)  Wt 161 lb 12.8 oz (73.392 kg)  BMI 26.12 kg/m2 The patient is alert and oriented x 3.  The mood and affect are normal.   Skin: warm and dry.  Color is normal.    HEENT:   the sclera are nonicteric.  The  mucous membranes are moist.  The carotids are 2+ without bruits.  There is no thyromegaly.  There is no JVD.    Lungs: clear.  The chest wall is non tender.    Heart: regular rate with a normal S1 and S2.  There are no murmurs, gallops, or rubs. The PMI is not displaced.  His sternotomy scar is healing well.  Abdomen: good bowel sounds.  There is no guarding or rebound.  There is no hepatosplenomegaly or tenderness.  There are no masses.   Extremities:  no clubbing, cyanosis, or edema.  The legs are without rashes.  The distal pulses are intact.   Neuro:  Cranial nerves II - XII are intact.  Motor and sensory functions are intact.    The gait is normal.  ECG: From Dr. Jone Baseman - 10/03/2010 - office reveals normal sinus rhythm. He has no ST or T wave changes . Assessment / Plan:

## 2010-11-08 NOTE — Assessment & Plan Note (Signed)
He has a 25 mm Carpentier-Edwards bioprosthetic valve. He seems to be doing quite well. He is exercising without any difficulties. We'll continue with the same medications.

## 2010-11-08 NOTE — Telephone Encounter (Signed)
Pt called and informed we have the medical records from [facility].

## 2010-11-09 ENCOUNTER — Telehealth: Payer: Self-pay | Admitting: Cardiovascular Disease

## 2010-11-09 ENCOUNTER — Encounter (HOSPITAL_COMMUNITY): Payer: Medicare Other

## 2010-11-09 NOTE — Telephone Encounter (Signed)
[facility] faxed over request for Dr. Elease Hashimoto to sign in order to provide coverage for a patient's cardiologist until they are switched.  Byrd Hesselbach states to please have Dr. Elease Hashimoto sign order and fax back.  If any questions please call Byrd Hesselbach back at [facility].  No Chart, Epic Only

## 2010-11-10 ENCOUNTER — Telehealth: Payer: Self-pay | Admitting: Cardiovascular Disease

## 2010-11-10 NOTE — Telephone Encounter (Signed)
[name] from [facility] is calling about an order that was previously faxed over.  She states to please call her back and will be at lunch until 1:30pm.

## 2010-11-10 NOTE — Telephone Encounter (Signed)
Epic only

## 2010-11-11 ENCOUNTER — Telehealth: Payer: Self-pay | Admitting: Cardiovascular Disease

## 2010-11-11 ENCOUNTER — Encounter (HOSPITAL_COMMUNITY): Payer: Medicare Other

## 2010-11-11 NOTE — Telephone Encounter (Signed)
[name] from [facility] calling about an order for cardiac rehab.  [name] says please call her back when you get the chance.  [phone].  Epic only.

## 2010-11-14 ENCOUNTER — Encounter (HOSPITAL_COMMUNITY): Payer: Medicare Other

## 2010-11-16 ENCOUNTER — Encounter (HOSPITAL_COMMUNITY): Payer: Medicare Other

## 2010-11-18 ENCOUNTER — Encounter (HOSPITAL_COMMUNITY): Payer: Medicare Other

## 2010-11-21 ENCOUNTER — Encounter (HOSPITAL_COMMUNITY): Payer: Medicare Other

## 2010-11-23 ENCOUNTER — Encounter (HOSPITAL_COMMUNITY): Payer: Medicare Other | Attending: Cardiovascular Disease

## 2010-11-23 ENCOUNTER — Encounter (HOSPITAL_COMMUNITY): Payer: Medicare Other

## 2010-11-23 DIAGNOSIS — I10 Essential (primary) hypertension: Secondary | ICD-10-CM | POA: Insufficient documentation

## 2010-11-23 DIAGNOSIS — E119 Type 2 diabetes mellitus without complications: Secondary | ICD-10-CM | POA: Insufficient documentation

## 2010-11-23 DIAGNOSIS — Z5189 Encounter for other specified aftercare: Secondary | ICD-10-CM | POA: Insufficient documentation

## 2010-11-23 DIAGNOSIS — I359 Nonrheumatic aortic valve disorder, unspecified: Secondary | ICD-10-CM | POA: Insufficient documentation

## 2010-11-23 DIAGNOSIS — K219 Gastro-esophageal reflux disease without esophagitis: Secondary | ICD-10-CM | POA: Insufficient documentation

## 2010-11-23 DIAGNOSIS — I059 Rheumatic mitral valve disease, unspecified: Secondary | ICD-10-CM | POA: Insufficient documentation

## 2010-11-23 DIAGNOSIS — Z954 Presence of other heart-valve replacement: Secondary | ICD-10-CM | POA: Insufficient documentation

## 2010-11-23 DIAGNOSIS — Z951 Presence of aortocoronary bypass graft: Secondary | ICD-10-CM | POA: Insufficient documentation

## 2010-11-25 ENCOUNTER — Encounter (HOSPITAL_COMMUNITY): Payer: Medicare Other

## 2010-11-28 ENCOUNTER — Encounter (HOSPITAL_COMMUNITY): Payer: Medicare Other

## 2010-11-30 ENCOUNTER — Encounter (HOSPITAL_COMMUNITY): Payer: Medicare Other

## 2010-12-02 ENCOUNTER — Encounter (HOSPITAL_COMMUNITY): Payer: Medicare Other

## 2010-12-05 ENCOUNTER — Encounter (HOSPITAL_COMMUNITY): Payer: Medicare Other

## 2010-12-07 ENCOUNTER — Encounter (HOSPITAL_COMMUNITY): Payer: Medicare Other

## 2010-12-09 ENCOUNTER — Encounter (HOSPITAL_COMMUNITY): Payer: Medicare Other

## 2010-12-12 ENCOUNTER — Encounter (HOSPITAL_COMMUNITY): Payer: Medicare Other

## 2010-12-14 ENCOUNTER — Encounter (HOSPITAL_COMMUNITY): Payer: Medicare Other

## 2010-12-16 ENCOUNTER — Encounter (HOSPITAL_COMMUNITY): Payer: Medicare Other

## 2010-12-19 ENCOUNTER — Encounter (HOSPITAL_COMMUNITY): Payer: Medicare Other | Attending: Pediatric Radiology

## 2010-12-19 DIAGNOSIS — I10 Essential (primary) hypertension: Secondary | ICD-10-CM | POA: Insufficient documentation

## 2010-12-19 DIAGNOSIS — Z954 Presence of other heart-valve replacement: Secondary | ICD-10-CM | POA: Insufficient documentation

## 2010-12-19 DIAGNOSIS — Z951 Presence of aortocoronary bypass graft: Secondary | ICD-10-CM | POA: Insufficient documentation

## 2010-12-19 DIAGNOSIS — I359 Nonrheumatic aortic valve disorder, unspecified: Secondary | ICD-10-CM | POA: Insufficient documentation

## 2010-12-19 DIAGNOSIS — I059 Rheumatic mitral valve disease, unspecified: Secondary | ICD-10-CM | POA: Insufficient documentation

## 2010-12-19 DIAGNOSIS — E119 Type 2 diabetes mellitus without complications: Secondary | ICD-10-CM | POA: Insufficient documentation

## 2010-12-19 DIAGNOSIS — Z5189 Encounter for other specified aftercare: Secondary | ICD-10-CM | POA: Insufficient documentation

## 2010-12-19 DIAGNOSIS — K219 Gastro-esophageal reflux disease without esophagitis: Secondary | ICD-10-CM | POA: Insufficient documentation

## 2010-12-21 ENCOUNTER — Encounter (HOSPITAL_COMMUNITY): Payer: Medicare Other

## 2010-12-23 ENCOUNTER — Encounter (HOSPITAL_COMMUNITY): Payer: Medicare Other

## 2010-12-26 ENCOUNTER — Encounter (HOSPITAL_COMMUNITY): Payer: Medicare Other

## 2010-12-28 ENCOUNTER — Encounter (HOSPITAL_COMMUNITY): Payer: Medicare Other

## 2010-12-29 LAB — URINALYSIS, ROUTINE W REFLEX MICROSCOPIC
Bilirubin Urine: NEGATIVE
Hgb urine dipstick: NEGATIVE
Nitrite: NEGATIVE
Specific Gravity, Urine: 1.017
pH: 5.5

## 2010-12-29 LAB — COMPREHENSIVE METABOLIC PANEL
AST: 20
Albumin: 4.1
Alkaline Phosphatase: 67
Chloride: 108
GFR calc Af Amer: >60
Potassium: 4.2
Sodium: 140
Total Bilirubin: 0.9
Total Protein: 6.5

## 2010-12-29 LAB — CBC
HCT: 33.6 — ABNORMAL LOW
Hemoglobin: 11.4 — ABNORMAL LOW
RBC: 3.69 — ABNORMAL LOW
RDW: 12.9
WBC: 11.3 — ABNORMAL HIGH

## 2010-12-30 ENCOUNTER — Encounter (HOSPITAL_COMMUNITY): Payer: Medicare Other

## 2011-01-02 ENCOUNTER — Encounter (HOSPITAL_COMMUNITY): Payer: Medicare Other

## 2011-01-04 ENCOUNTER — Encounter (HOSPITAL_COMMUNITY): Payer: Medicare Other

## 2011-01-06 ENCOUNTER — Encounter (HOSPITAL_COMMUNITY): Payer: Medicare Other

## 2011-01-09 ENCOUNTER — Ambulatory Visit (INDEPENDENT_AMBULATORY_CARE_PROVIDER_SITE_OTHER): Payer: Medicare Other | Admitting: Cardiovascular Disease

## 2011-01-09 ENCOUNTER — Encounter: Payer: Self-pay | Admitting: Cardiovascular Disease

## 2011-01-09 ENCOUNTER — Encounter (HOSPITAL_COMMUNITY): Payer: Medicare Other

## 2011-01-09 VITALS — BP 129/81 | HR 74 | Ht 66.0 in | Wt 159.1 lb

## 2011-01-09 DIAGNOSIS — I2510 Atherosclerotic heart disease of native coronary artery without angina pectoris: Secondary | ICD-10-CM

## 2011-01-09 DIAGNOSIS — Z9889 Other specified postprocedural states: Secondary | ICD-10-CM

## 2011-01-09 DIAGNOSIS — I359 Nonrheumatic aortic valve disorder, unspecified: Secondary | ICD-10-CM

## 2011-01-09 DIAGNOSIS — Z952 Presence of prosthetic heart valve: Secondary | ICD-10-CM

## 2011-01-09 NOTE — Assessment & Plan Note (Addendum)
He's very stable from a cardiac standpoint. We'll continue with the same medications. He is walking as much as 5-7 miles every other day. I'll see him again in 6 months for office visit and fasting lipids.

## 2011-01-09 NOTE — Assessment & Plan Note (Signed)
He remains very stable. We will repeat his echocardiogram in 6 months.

## 2011-01-09 NOTE — Progress Notes (Signed)
[name] Date of Birth  02-Aug-1935 [facility] [address]    [address] [place], [place]  [address] [phone]  Fax  [phone]  History of Present Illness:  [name] is a 75 year old gentleman with a history of coronary artery disease. He status post coronary artery bypass grafting, aortic valve replacement, and mitral valve repair. ( August 23, 2010).  He walks on a regular basis. He walks every other day and yesterday walked for 8.7 miles. He typically walks between 5 and 7 miles every  Other day.  He has a cardiac rehabilitation on his off days.    Current Outpatient Prescriptions on File Prior to Visit  Medication Sig Dispense Refill  . amLODipine (NORVASC) 5 MG tablet Take by mouth daily. Take 1/2 Tab Daily       . aspirin 325 MG tablet Take 325 mg by mouth daily.        [name] FLUoxetine (PROZAC) 20 MG tablet Take 20 mg by mouth daily.        . Melatonin 3 MG CAPS Take by mouth.        . metFORMIN (GLUMETZA) 1000 MG (MOD) 24 hr tablet Take 1,000 mg by mouth 2 (two) times daily with a meal.        . metoprolol tartrate (LOPRESSOR) 25 MG tablet Take 25 mg by mouth 2 (two) times daily.        . Multiple Vitamin (MULTIVITAMIN PO) Take by mouth.        [name] omeprazole (PRILOSEC) 20 MG capsule Take 20 mg by mouth daily.        . rosuvastatin (CRESTOR) 10 MG tablet Take 10 mg by mouth daily.        . vitamin C (ASCORBIC ACID) 500 MG tablet Take 500 mg by mouth 2 (two) times daily.          No Known Allergies  Past Medical History  Diagnosis Date  . Hyperlipidemia   . Diabetes mellitus   . Mitral valve prolapse   . Aortic valve regurgitation   . Degenerative disk disease   . Major depression   . Spinal stenosis   . Coronary artery disease     post stents    Past Surgical History  Procedure Date  . Appendectomy 1951  . Tonsillectomy 1956  . Adenoidectomy 1956  . Spine surgery 1987    1987 - 1996 - 1997 - multiple spinal surgeries both in the C-spine and the  lumbar     . Carotid endarterectomy 05/05/2003    right carotid endarterectomy done in 2005  . Shoulder surgery 01/07/2007    Left shoulder impingement, labral tear  . Cardiac catheterization 10/20/2003    Est. EF of 65% -- Critical disease in the mid to distal circumflex --  Preserved left ventricular  function --   . Cardiac catheterization 10/20/2003    Percutaneous coronary intervention/drug-eluding stent implantation, third marginal branch --  Percutaneous closure, right femoral artery -- Atherosclerotic coronary vascular disease, single vessel / Status post successful percutaneous coronary intervention/tandem drug -- eluding stent implantation proximal third marginal branch -- Typical angina was not reproduced with device insertion or balloon   . Hematoma evacuation 05/09/2003    Evacuation of hematoma, right neck  . Coronary artery bypass graft 08/29/2010    SVG to PDA, SVG to OM2- [facility]  . Aortic valve replacement 08/29/2010    25 mm CE Magna bioprosthetic - [facility]  . Mitral valve repair 08/29/2010  28 mm Simulus ring -[facility]    History  Smoking status  . Former Smoker -- 1.5 packs/day for 35 years  . Types: Cigarettes  . Quit date: 03/20/1984  Smokeless tobacco  . Never Used    History  Alcohol Use No    Family History  Problem Relation Age of Onset  . Diabetes Mother   . Stroke Father   . Coronary artery disease Mother     Reviw of Systems:  Reviewed in the HPI.  All other systems are negative.  Physical Exam: BP 129/81  Pulse 74  Ht 5\' 6"  (1.676 m)  Wt 159 lb 1.9 oz (72.176 kg)  BMI 25.68 kg/m2 The patient is alert and oriented x 3.  The mood and affect are normal.   Skin: warm and dry.  Color is normal.    HEENT:   Normal carotids, no JVD  Lungs: clear   Heart: RR, no murmurs    Abdomen: + BS  Extremities:  No edema  Neuro:  Non focal     ECG:  Assessment / Plan:

## 2011-01-09 NOTE — Patient Instructions (Addendum)
Your physician wants you to follow-up in: 6 months, You will receive a reminder letter in the mail two months in advance. If you don't receive a letter, please call our office to schedule the follow-up appointment.  Your physician recommends that you return for a FASTING lipid profile: 6 months  Your physician recommends that you continue on your current medications as directed. Please refer to the Current Medication list given to you today.

## 2011-01-09 NOTE — Assessment & Plan Note (Signed)
Remains very stable. We'll continue to follow. We'll anticipate getting an echocardiogram in 6 months.

## 2011-01-11 ENCOUNTER — Encounter (HOSPITAL_COMMUNITY): Payer: Medicare Other

## 2011-01-11 ENCOUNTER — Telehealth: Payer: Self-pay | Admitting: *Deleted

## 2011-01-11 DIAGNOSIS — Z952 Presence of prosthetic heart valve: Secondary | ICD-10-CM

## 2011-01-11 DIAGNOSIS — Z9889 Other specified postprocedural states: Secondary | ICD-10-CM

## 2011-01-11 NOTE — Telephone Encounter (Signed)
Informed wife that a 6 mo reminder letter will be sent when time to set up echo.

## 2011-01-11 NOTE — Telephone Encounter (Signed)
Message copied by Antony Odea on Wed Jan 11, 2011  5:05 PM ------      Message from: [place]      Created: Mon Jan 09, 2011  6:31 PM       Lets order an echocardiogram for our 6 months visit - to follow up his AVR and MV repair

## 2011-01-13 ENCOUNTER — Encounter (HOSPITAL_COMMUNITY): Payer: Medicare Other

## 2011-01-16 ENCOUNTER — Encounter (HOSPITAL_COMMUNITY): Payer: Medicare Other

## 2011-01-18 ENCOUNTER — Encounter (HOSPITAL_COMMUNITY): Payer: Medicare Other

## 2011-01-20 ENCOUNTER — Encounter (HOSPITAL_COMMUNITY): Payer: Medicare Other | Attending: Pediatric Radiology

## 2011-01-20 DIAGNOSIS — E119 Type 2 diabetes mellitus without complications: Secondary | ICD-10-CM | POA: Insufficient documentation

## 2011-01-20 DIAGNOSIS — Z5189 Encounter for other specified aftercare: Secondary | ICD-10-CM | POA: Insufficient documentation

## 2011-01-20 DIAGNOSIS — I059 Rheumatic mitral valve disease, unspecified: Secondary | ICD-10-CM | POA: Insufficient documentation

## 2011-01-20 DIAGNOSIS — I10 Essential (primary) hypertension: Secondary | ICD-10-CM | POA: Insufficient documentation

## 2011-01-20 DIAGNOSIS — Z951 Presence of aortocoronary bypass graft: Secondary | ICD-10-CM | POA: Insufficient documentation

## 2011-01-20 DIAGNOSIS — Z954 Presence of other heart-valve replacement: Secondary | ICD-10-CM | POA: Insufficient documentation

## 2011-01-20 DIAGNOSIS — K219 Gastro-esophageal reflux disease without esophagitis: Secondary | ICD-10-CM | POA: Insufficient documentation

## 2011-01-20 DIAGNOSIS — I359 Nonrheumatic aortic valve disorder, unspecified: Secondary | ICD-10-CM | POA: Insufficient documentation

## 2011-01-25 ENCOUNTER — Encounter (HOSPITAL_COMMUNITY): Payer: Self-pay

## 2011-01-25 DIAGNOSIS — I059 Rheumatic mitral valve disease, unspecified: Secondary | ICD-10-CM | POA: Insufficient documentation

## 2011-01-25 DIAGNOSIS — I10 Essential (primary) hypertension: Secondary | ICD-10-CM | POA: Insufficient documentation

## 2011-01-25 DIAGNOSIS — I359 Nonrheumatic aortic valve disorder, unspecified: Secondary | ICD-10-CM | POA: Insufficient documentation

## 2011-01-25 DIAGNOSIS — K219 Gastro-esophageal reflux disease without esophagitis: Secondary | ICD-10-CM | POA: Insufficient documentation

## 2011-01-25 DIAGNOSIS — Z5189 Encounter for other specified aftercare: Secondary | ICD-10-CM | POA: Insufficient documentation

## 2011-01-25 DIAGNOSIS — Z954 Presence of other heart-valve replacement: Secondary | ICD-10-CM | POA: Insufficient documentation

## 2011-01-25 DIAGNOSIS — Z951 Presence of aortocoronary bypass graft: Secondary | ICD-10-CM | POA: Insufficient documentation

## 2011-01-25 DIAGNOSIS — E119 Type 2 diabetes mellitus without complications: Secondary | ICD-10-CM | POA: Insufficient documentation

## 2011-01-27 ENCOUNTER — Encounter (HOSPITAL_COMMUNITY)
Admission: RE | Admit: 2011-01-27 | Discharge: 2011-01-27 | Payer: Self-pay | Source: Ambulatory Visit | Attending: Cardiovascular Disease | Admitting: Cardiovascular Disease

## 2011-01-30 ENCOUNTER — Encounter (HOSPITAL_COMMUNITY): Payer: Self-pay

## 2011-02-01 ENCOUNTER — Encounter (HOSPITAL_COMMUNITY): Payer: Self-pay

## 2011-02-03 ENCOUNTER — Encounter (HOSPITAL_COMMUNITY)
Admission: RE | Admit: 2011-02-03 | Discharge: 2011-02-03 | Disposition: A | Discharge status: Home / Self Care | Payer: Self-pay | Source: Ambulatory Visit | Attending: Cardiovascular Disease | Admitting: Cardiovascular Disease

## 2011-02-06 ENCOUNTER — Encounter (HOSPITAL_COMMUNITY)
Admission: RE | Admit: 2011-02-06 | Discharge: 2011-02-06 | Disposition: A | Discharge status: Home / Self Care | Payer: Self-pay | Source: Ambulatory Visit | Attending: Cardiovascular Disease | Admitting: Cardiovascular Disease

## 2011-02-08 ENCOUNTER — Encounter (HOSPITAL_COMMUNITY): Payer: Self-pay

## 2011-02-10 ENCOUNTER — Encounter (HOSPITAL_COMMUNITY): Payer: Self-pay

## 2011-02-13 ENCOUNTER — Encounter (HOSPITAL_COMMUNITY)
Admission: RE | Admit: 2011-02-13 | Discharge: 2011-02-13 | Disposition: A | Discharge status: Home / Self Care | Payer: Self-pay | Source: Ambulatory Visit | Attending: Cardiovascular Disease | Admitting: Cardiovascular Disease

## 2011-02-15 ENCOUNTER — Encounter (HOSPITAL_COMMUNITY)
Admission: RE | Admit: 2011-02-15 | Discharge: 2011-02-15 | Disposition: A | Discharge status: Home / Self Care | Payer: Self-pay | Source: Ambulatory Visit | Attending: Cardiovascular Disease | Admitting: Cardiovascular Disease

## 2011-02-17 ENCOUNTER — Encounter (HOSPITAL_COMMUNITY)
Admission: RE | Admit: 2011-02-17 | Discharge: 2011-02-17 | Disposition: A | Discharge status: Home / Self Care | Payer: Self-pay | Source: Ambulatory Visit | Attending: Cardiovascular Disease | Admitting: Cardiovascular Disease

## 2011-02-17 ENCOUNTER — Other Ambulatory Visit: Payer: Self-pay | Admitting: Dermatology

## 2011-02-20 ENCOUNTER — Encounter (HOSPITAL_COMMUNITY)
Admission: RE | Admit: 2011-02-20 | Discharge: 2011-02-20 | Disposition: A | Discharge status: Home / Self Care | Payer: Self-pay | Source: Ambulatory Visit | Attending: Cardiovascular Disease | Admitting: Cardiovascular Disease

## 2011-02-20 DIAGNOSIS — I059 Rheumatic mitral valve disease, unspecified: Secondary | ICD-10-CM | POA: Insufficient documentation

## 2011-02-20 DIAGNOSIS — Z954 Presence of other heart-valve replacement: Secondary | ICD-10-CM | POA: Insufficient documentation

## 2011-02-20 DIAGNOSIS — Z951 Presence of aortocoronary bypass graft: Secondary | ICD-10-CM | POA: Insufficient documentation

## 2011-02-20 DIAGNOSIS — Z5189 Encounter for other specified aftercare: Secondary | ICD-10-CM | POA: Insufficient documentation

## 2011-02-20 DIAGNOSIS — I10 Essential (primary) hypertension: Secondary | ICD-10-CM | POA: Insufficient documentation

## 2011-02-20 DIAGNOSIS — K219 Gastro-esophageal reflux disease without esophagitis: Secondary | ICD-10-CM | POA: Insufficient documentation

## 2011-02-20 DIAGNOSIS — I359 Nonrheumatic aortic valve disorder, unspecified: Secondary | ICD-10-CM | POA: Insufficient documentation

## 2011-02-20 DIAGNOSIS — E119 Type 2 diabetes mellitus without complications: Secondary | ICD-10-CM | POA: Insufficient documentation

## 2011-02-22 ENCOUNTER — Encounter (HOSPITAL_COMMUNITY): Payer: Self-pay

## 2011-02-24 ENCOUNTER — Encounter (HOSPITAL_COMMUNITY)
Admission: RE | Admit: 2011-02-24 | Discharge: 2011-02-24 | Disposition: A | Discharge status: Home / Self Care | Payer: Self-pay | Source: Ambulatory Visit | Attending: Cardiovascular Disease | Admitting: Cardiovascular Disease

## 2011-02-27 ENCOUNTER — Encounter (HOSPITAL_COMMUNITY)
Admission: RE | Admit: 2011-02-27 | Discharge: 2011-02-27 | Disposition: A | Discharge status: Home / Self Care | Payer: Self-pay | Source: Ambulatory Visit | Attending: Cardiovascular Disease | Admitting: Cardiovascular Disease

## 2011-03-01 ENCOUNTER — Encounter (HOSPITAL_COMMUNITY)
Admission: RE | Admit: 2011-03-01 | Discharge: 2011-03-01 | Disposition: A | Discharge status: Home / Self Care | Payer: Self-pay | Source: Ambulatory Visit | Attending: Cardiovascular Disease | Admitting: Cardiovascular Disease

## 2011-03-03 ENCOUNTER — Encounter (HOSPITAL_COMMUNITY)
Admission: RE | Admit: 2011-03-03 | Discharge: 2011-03-03 | Disposition: A | Discharge status: Home / Self Care | Payer: Self-pay | Source: Ambulatory Visit | Attending: Cardiovascular Disease | Admitting: Cardiovascular Disease

## 2011-03-03 DIAGNOSIS — H269 Unspecified cataract: Secondary | ICD-10-CM | POA: Insufficient documentation

## 2011-03-06 ENCOUNTER — Encounter (HOSPITAL_COMMUNITY): Payer: Self-pay

## 2011-03-08 ENCOUNTER — Encounter (HOSPITAL_COMMUNITY)
Admission: RE | Admit: 2011-03-08 | Discharge: 2011-03-08 | Disposition: A | Discharge status: Home / Self Care | Payer: Self-pay | Source: Ambulatory Visit | Attending: Cardiovascular Disease | Admitting: Cardiovascular Disease

## 2011-03-09 ENCOUNTER — Encounter: Payer: Self-pay | Admitting: Vascular Surgery

## 2011-03-10 ENCOUNTER — Other Ambulatory Visit: Payer: Medicare Other

## 2011-03-10 ENCOUNTER — Ambulatory Visit: Payer: Medicare Other | Admitting: Vascular Surgery

## 2011-03-10 ENCOUNTER — Encounter (HOSPITAL_COMMUNITY)
Admission: RE | Admit: 2011-03-10 | Discharge: 2011-03-10 | Disposition: A | Discharge status: Home / Self Care | Payer: Self-pay | Source: Ambulatory Visit | Attending: Cardiovascular Disease | Admitting: Cardiovascular Disease

## 2011-03-13 ENCOUNTER — Encounter (HOSPITAL_COMMUNITY): Payer: Self-pay

## 2011-03-15 ENCOUNTER — Encounter (HOSPITAL_COMMUNITY): Payer: Self-pay

## 2011-03-17 ENCOUNTER — Encounter (HOSPITAL_COMMUNITY): Payer: Self-pay

## 2011-03-20 ENCOUNTER — Encounter (HOSPITAL_COMMUNITY): Payer: Self-pay

## 2011-03-22 ENCOUNTER — Encounter (HOSPITAL_COMMUNITY): Payer: Self-pay

## 2011-03-24 ENCOUNTER — Encounter (HOSPITAL_COMMUNITY): Payer: Self-pay

## 2011-03-24 DIAGNOSIS — I10 Essential (primary) hypertension: Secondary | ICD-10-CM | POA: Insufficient documentation

## 2011-03-24 DIAGNOSIS — Z951 Presence of aortocoronary bypass graft: Secondary | ICD-10-CM | POA: Insufficient documentation

## 2011-03-24 DIAGNOSIS — Z5189 Encounter for other specified aftercare: Secondary | ICD-10-CM | POA: Insufficient documentation

## 2011-03-24 DIAGNOSIS — Z954 Presence of other heart-valve replacement: Secondary | ICD-10-CM | POA: Insufficient documentation

## 2011-03-24 DIAGNOSIS — I059 Rheumatic mitral valve disease, unspecified: Secondary | ICD-10-CM | POA: Insufficient documentation

## 2011-03-24 DIAGNOSIS — I359 Nonrheumatic aortic valve disorder, unspecified: Secondary | ICD-10-CM | POA: Insufficient documentation

## 2011-03-24 DIAGNOSIS — K219 Gastro-esophageal reflux disease without esophagitis: Secondary | ICD-10-CM | POA: Insufficient documentation

## 2011-03-24 DIAGNOSIS — E119 Type 2 diabetes mellitus without complications: Secondary | ICD-10-CM | POA: Insufficient documentation

## 2011-03-27 ENCOUNTER — Encounter (HOSPITAL_COMMUNITY)
Admission: RE | Admit: 2011-03-27 | Discharge: 2011-03-27 | Disposition: A | Discharge status: Home / Self Care | Payer: Self-pay | Source: Ambulatory Visit | Attending: Cardiovascular Disease | Admitting: Cardiovascular Disease

## 2011-03-27 NOTE — Progress Notes (Signed)
[name]'s weight is up 2.5 kg today. This is his first day back to exercise since 03/10/11. He states that he ate a lot over the holidays. Pt denies SOB, no edema in hands or feet. He did report an episode of dizziness at home today, which lasted 2-3 minutes, which was preceded by him feeling like his ears were stopped up. Pt was encouraged to let his physician know about episode of dizziness and he states that he will tomorrow when he sees his PCP Dr Valentina Lucks.

## 2011-03-28 DIAGNOSIS — I10 Essential (primary) hypertension: Secondary | ICD-10-CM | POA: Diagnosis not present

## 2011-03-28 DIAGNOSIS — E119 Type 2 diabetes mellitus without complications: Secondary | ICD-10-CM | POA: Diagnosis not present

## 2011-03-28 DIAGNOSIS — R42 Dizziness and giddiness: Secondary | ICD-10-CM | POA: Diagnosis not present

## 2011-03-28 DIAGNOSIS — K219 Gastro-esophageal reflux disease without esophagitis: Secondary | ICD-10-CM | POA: Diagnosis not present

## 2011-03-29 ENCOUNTER — Encounter (HOSPITAL_COMMUNITY): Payer: Self-pay

## 2011-03-31 ENCOUNTER — Encounter (HOSPITAL_COMMUNITY): Payer: Self-pay

## 2011-04-03 ENCOUNTER — Encounter (HOSPITAL_COMMUNITY)
Admission: RE | Admit: 2011-04-03 | Discharge: 2011-04-03 | Disposition: A | Discharge status: Home / Self Care | Payer: Self-pay | Source: Ambulatory Visit | Attending: Cardiovascular Disease | Admitting: Cardiovascular Disease

## 2011-04-05 ENCOUNTER — Encounter (HOSPITAL_COMMUNITY): Payer: Self-pay

## 2011-04-07 ENCOUNTER — Encounter (HOSPITAL_COMMUNITY): Payer: Self-pay

## 2011-04-10 ENCOUNTER — Encounter (HOSPITAL_COMMUNITY): Payer: Self-pay

## 2011-04-12 ENCOUNTER — Encounter (HOSPITAL_COMMUNITY): Payer: Self-pay

## 2011-04-14 ENCOUNTER — Ambulatory Visit: Payer: Medicare Other | Admitting: Vascular Surgery

## 2011-04-14 ENCOUNTER — Other Ambulatory Visit: Payer: Medicare Other

## 2011-04-14 ENCOUNTER — Encounter (HOSPITAL_COMMUNITY): Payer: Self-pay

## 2011-04-17 ENCOUNTER — Encounter (HOSPITAL_COMMUNITY)
Admission: RE | Admit: 2011-04-17 | Discharge: 2011-04-17 | Disposition: A | Discharge status: Home / Self Care | Payer: Self-pay | Source: Ambulatory Visit | Attending: Cardiovascular Disease | Admitting: Cardiovascular Disease

## 2011-04-19 ENCOUNTER — Encounter (HOSPITAL_COMMUNITY): Payer: Self-pay

## 2011-04-21 ENCOUNTER — Encounter (HOSPITAL_COMMUNITY): Payer: Self-pay

## 2011-04-21 DIAGNOSIS — Z951 Presence of aortocoronary bypass graft: Secondary | ICD-10-CM | POA: Insufficient documentation

## 2011-04-21 DIAGNOSIS — I059 Rheumatic mitral valve disease, unspecified: Secondary | ICD-10-CM | POA: Insufficient documentation

## 2011-04-21 DIAGNOSIS — E119 Type 2 diabetes mellitus without complications: Secondary | ICD-10-CM | POA: Insufficient documentation

## 2011-04-21 DIAGNOSIS — K219 Gastro-esophageal reflux disease without esophagitis: Secondary | ICD-10-CM | POA: Insufficient documentation

## 2011-04-21 DIAGNOSIS — Z5189 Encounter for other specified aftercare: Secondary | ICD-10-CM | POA: Insufficient documentation

## 2011-04-21 DIAGNOSIS — Z954 Presence of other heart-valve replacement: Secondary | ICD-10-CM | POA: Insufficient documentation

## 2011-04-21 DIAGNOSIS — I10 Essential (primary) hypertension: Secondary | ICD-10-CM | POA: Insufficient documentation

## 2011-04-21 DIAGNOSIS — I359 Nonrheumatic aortic valve disorder, unspecified: Secondary | ICD-10-CM | POA: Insufficient documentation

## 2011-04-24 ENCOUNTER — Encounter (HOSPITAL_COMMUNITY): Payer: Self-pay

## 2011-04-26 ENCOUNTER — Encounter (HOSPITAL_COMMUNITY): Payer: Self-pay

## 2011-04-28 ENCOUNTER — Encounter (HOSPITAL_COMMUNITY)
Admission: RE | Admit: 2011-04-28 | Discharge: 2011-04-28 | Disposition: A | Discharge status: Home / Self Care | Payer: Self-pay | Source: Ambulatory Visit | Attending: Cardiovascular Disease | Admitting: Cardiovascular Disease

## 2011-05-01 ENCOUNTER — Encounter (HOSPITAL_COMMUNITY)
Admission: RE | Admit: 2011-05-01 | Discharge: 2011-05-01 | Disposition: A | Discharge status: Home / Self Care | Payer: Self-pay | Source: Ambulatory Visit | Attending: Cardiovascular Disease | Admitting: Cardiovascular Disease

## 2011-05-03 ENCOUNTER — Encounter (HOSPITAL_COMMUNITY): Payer: Self-pay

## 2011-05-05 ENCOUNTER — Encounter (HOSPITAL_COMMUNITY): Admission: RE | Admit: 2011-05-05 | Payer: Self-pay | Source: Ambulatory Visit

## 2011-05-08 ENCOUNTER — Encounter (HOSPITAL_COMMUNITY)
Admission: RE | Admit: 2011-05-08 | Discharge: 2011-05-08 | Disposition: A | Discharge status: Home / Self Care | Payer: Self-pay | Source: Ambulatory Visit | Attending: Cardiovascular Disease | Admitting: Cardiovascular Disease

## 2011-05-10 ENCOUNTER — Encounter (HOSPITAL_COMMUNITY)
Admission: RE | Admit: 2011-05-10 | Discharge: 2011-05-10 | Disposition: A | Discharge status: Home / Self Care | Payer: Self-pay | Source: Ambulatory Visit | Attending: Cardiovascular Disease | Admitting: Cardiovascular Disease

## 2011-05-12 ENCOUNTER — Encounter (HOSPITAL_COMMUNITY): Payer: Self-pay

## 2011-05-15 ENCOUNTER — Encounter (HOSPITAL_COMMUNITY)
Admission: RE | Admit: 2011-05-15 | Discharge: 2011-05-15 | Disposition: A | Discharge status: Home / Self Care | Payer: Self-pay | Source: Ambulatory Visit | Attending: Cardiovascular Disease | Admitting: Cardiovascular Disease

## 2011-05-17 ENCOUNTER — Encounter (HOSPITAL_COMMUNITY): Payer: Self-pay

## 2011-05-19 ENCOUNTER — Encounter (HOSPITAL_COMMUNITY): Payer: Medicare Other

## 2011-05-19 ENCOUNTER — Ambulatory Visit: Payer: Medicare Other | Admitting: Vascular Surgery

## 2011-05-19 ENCOUNTER — Other Ambulatory Visit (INDEPENDENT_AMBULATORY_CARE_PROVIDER_SITE_OTHER): Payer: Medicare Other | Admitting: *Deleted

## 2011-05-19 ENCOUNTER — Other Ambulatory Visit: Payer: Medicare Other

## 2011-05-19 ENCOUNTER — Ambulatory Visit (INDEPENDENT_AMBULATORY_CARE_PROVIDER_SITE_OTHER): Payer: Medicare Other | Admitting: Vascular Surgery

## 2011-05-19 ENCOUNTER — Encounter: Payer: Self-pay | Admitting: Vascular Surgery

## 2011-05-19 VITALS — BP 136/72 | HR 69 | Resp 16 | Ht 66.0 in | Wt 162.0 lb

## 2011-05-19 DIAGNOSIS — Z48812 Encounter for surgical aftercare following surgery on the circulatory system: Secondary | ICD-10-CM

## 2011-05-19 DIAGNOSIS — I6529 Occlusion and stenosis of unspecified carotid artery: Secondary | ICD-10-CM

## 2011-05-19 NOTE — Progress Notes (Signed)
VASCULAR AND VEIN SURGERY CAROTID FOLLOW-UP  Right CEA - 05/05/2003  HPI: [name] is a 76 y.o. male right handed who has known carotid disease. Patient is doing well. Pt. has had surgical intervention of right CEA .  Patient has Negative history of TIA or stroke symptom.  The patient denies amaurosis fugax or monocular blindness.  The patient  denies facial drooping.   Pt. denies headache Pt. denies hemiplegia.  The patient denies receptive or expressive aphasia.  Pt. denies weakness in BUE/BLE   Non-Invasive Vascular Imaging CAROTID DUPLEX 05/19/2011  Right ICA 0 - 19% stenosis Left ICA 20 - 39 % stenosis  These findings are Unchanged from previous exam  Physical Exam: Filed Vitals:   05/19/11 1518 05/19/11 1520  BP: 134/65 136/72  Pulse: 68 69  Resp: 16   Height: 5\' 6"  (1.676 m)   Weight: 162 lb (73.483 kg)   SpO2: 99% 100%    Pt is A&O x 3 Gait is normal Negative Bilateral carotid bruit/s Neuro Exam: Speech is fluent Negative weakness BUE/BLE Negative tongue deviation Negative facial droop  Plan: Follow-up in 2 years with Carotid Duplex scan  Pt was given information regarding stroke symptoms and prevention  Clinic MD: [facility]

## 2011-05-19 NOTE — Patient Instructions (Signed)
Stroke Prevention Some medical conditions and some behaviors are associated with an increased chance of having a stroke. You may prevent a stroke by making healthy choices and managing medical conditions. You can reduce your risk of having a stroke by:  Staying physically active. It is recommended that you get at least 30 minutes of activity on most or all days.   Stop smoking.   Limiting alcohol use. Moderate alcohol use is considered to be: No more than 2 drinks per day for men. No more than 1 drink per day for non-pregnant women.   DIET.  A low fat, low cholesterol, low salt and high fiber diet with servings of fruits and vegetables daily will help .   Managing your cholesterol levels.. Take any prescribed medications to control cholesterol as directed by your caregiver.   Managing your diabetes. Diabetic diet as recommended by the ADA. Take any prescribed medications to control diabetes as directed by your caregiver.   Controlling your high blood pressure (hypertension). It is very important to take any prescribed medications to control hypertension .   Aspirin: Take Aspirin daily as prescribed by your physician. Do not take aspirin with blood thinners unless prescribed by your Physician  You may be on "blood thinners" (anticoagulants) Coumadin, Plavix, Agrennox, Effient. All these medications are helpful in reducing the risk of forming abnormal blood clots. If you have the irregular heart rhythm of atrial fibrillation, you may be on a blood thinner.  Be sure you understand all your medication instructions.   SIGNS OF STROKE: SEEK IMMEDIATE MEDICAL CARE IF: You have sudden weakness or numbness of the face, arm, or leg, especially on 1 side of the body.  You have sudden confusion.  You have trouble speaking (aphasia) or understanding.  You have sudden trouble seeing in 1 or both eyes.  You have sudden trouble walking.  You have dizziness.  You have a loss of balance or coordination.   You have a sudden severe headache with no known cause.    You have new chest pain, angina, or an irregular heartbeat.   ANY OF THESE SYMPTOMS MAY REPRESENT A SERIOUS PROBLEM THAT IS AN EMERGENCY. Do not wait to see if the symptoms will go away. Get medical help right away. Call your local emergency services (911 in [place] DO NOT drive yourself to the hospital. Stroke Prevention Some medical conditions and some behaviors are associated with an increased chance of having a stroke. You may prevent a stroke by making healthy choices and managing medical conditions. You can reduce your risk of having a stroke by:  Staying physically active. It is recommended that you get at least 30 minutes of activity on most or all days.   Not smoking.   Limiting alcohol use. Moderate alcohol use is considered to be:   No more than 2 drinks per day for men.   No more than 1 drink per day for nonpregnant women.   Eating healthy foods. Certain diets may be prescribed to address high blood pressure, high cholesterol, diabetes, or obesity. A diet that includes 5 or more servings of fruits and vegetables a day may reduce the risk of stroke.   Managing your cholesterol levels. A low saturated fat, low trans fat, low cholesterol, and high fiber diet may control cholesterol levels. Take any prescribed medications to control cholesterol as directed by your caregiver.   Managing your diabetes. A controlled carbohydrate, controlled sugar diet is recommended to manage diabetes. Take any prescribed medications to   control diabetes as directed by your caregiver.   Controlling your high blood pressure (hypertension). A low salt (sodium), low saturated fat, low trans fat, and low cholesterol diet is recommended to manage high blood pressure. Take any prescribed medications to control hypertension as directed by your caregiver.   Maintaining a healthy weight. A reduced calorie, low sodium, low saturated fat, low trans fat, low  cholesterol diet is recommended to manage weight.   Stopping drug abuse.   Taking medications as directed by your caregiver. For some individuals, aspirin or "blood thinners" (anticoagulants) are helpful in reducing the risk of forming abnormal blood clots that can lead to stroke. If you have the irregular heart rhythm of atrial fibrillation, you should be on a blood thinner unless there is a good reason you cannot take them. Be sure you understand all your medication instructions.  SEEK IMMEDIATE MEDICAL CARE IF:  You have sudden weakness or numbness of the face, arm, or leg, especially on 1 side of the body.   You have sudden confusion.   You have trouble speaking (aphasia) or understanding.   You have sudden trouble seeing in 1 or both eyes.   You have sudden trouble walking.   You have a loss of balance or coordination.   You have a sudden severe headache with no known cause.    ANY OF THESE SYMPTOMS MAY REPRESENT A SERIOUS PROBLEM THAT IS AN EMERGENCY. Do not wait to see if the symptoms will go away. Get medical help right away. Call your local emergency services (911 in [place] DO NOT drive yourself to the hospital. Document Released: 04/13/2004 Document Re-Released: 08/24/2009 [employer] Patient Information 2011 [employer].  

## 2011-05-22 ENCOUNTER — Encounter (HOSPITAL_COMMUNITY): Payer: Medicare Other

## 2011-05-24 ENCOUNTER — Encounter (HOSPITAL_COMMUNITY): Payer: Medicare Other

## 2011-05-26 ENCOUNTER — Encounter (HOSPITAL_COMMUNITY): Payer: Medicare Other

## 2011-05-26 NOTE — Procedures (Unsigned)
CAROTID DUPLEX EXAM  INDICATION:  Carotid endarterectomy.  HISTORY: Diabetes:  Yes. Cardiac:  CAD, stents. Hypertension:  Yes. Smoking:  Previous. Previous Surgery:  Right carotid endarterectomy in 2005 by Dr. Madilyn Fireman. CV History:  Currently asymptomatic. Amaurosis Fugax No, Paresthesias No, Hemiparesis No                                      RIGHT             LEFT Brachial systolic pressure:         126               122 Brachial Doppler waveforms:         Normal            Normal Vertebral direction of flow:        Antegrade         Antegrade DUPLEX VELOCITIES (cm/sec) CCA peak systolic                   52                86 ECA peak systolic                   48                73 ICA peak systolic                   50                94 ICA end diastolic                   14                21 PLAQUE MORPHOLOGY:                  Heterogeneous     Heterogeneous PLAQUE AMOUNT:                      Mild              Mild PLAQUE LOCATION:                    CCA               ICA/CCA  IMPRESSION: 1. Patent right carotid endarterectomy site with no right internal     carotid artery stenosis. 2. Doppler velocities suggest 1-39% stenosis of the left proximal     internal carotid artery. 3. Bilateral internal carotid arteries tortuosity noted. 4. No significant change in velocities when compared to the previous     studies.  ___________________________________________ Fransisco Hertz, MD  CH/MEDQ  D:  05/23/2011  T:  05/23/2011  Job:  [id number]

## 2011-05-29 ENCOUNTER — Encounter (HOSPITAL_COMMUNITY): Payer: Medicare Other

## 2011-05-31 ENCOUNTER — Encounter (HOSPITAL_COMMUNITY): Payer: Medicare Other

## 2011-06-02 ENCOUNTER — Other Ambulatory Visit: Payer: Medicare Other

## 2011-06-02 ENCOUNTER — Ambulatory Visit: Payer: Medicare Other | Admitting: Vascular Surgery

## 2011-06-02 ENCOUNTER — Encounter (HOSPITAL_COMMUNITY): Payer: Medicare Other

## 2011-06-05 ENCOUNTER — Encounter (HOSPITAL_COMMUNITY): Payer: Medicare Other

## 2011-06-07 ENCOUNTER — Encounter (HOSPITAL_COMMUNITY): Payer: Medicare Other

## 2011-06-09 ENCOUNTER — Encounter (HOSPITAL_COMMUNITY): Payer: Medicare Other

## 2011-06-12 ENCOUNTER — Encounter (HOSPITAL_COMMUNITY): Payer: Medicare Other

## 2011-06-14 ENCOUNTER — Encounter (HOSPITAL_COMMUNITY): Payer: Medicare Other

## 2011-06-16 ENCOUNTER — Encounter (HOSPITAL_COMMUNITY): Payer: Medicare Other

## 2011-06-19 ENCOUNTER — Encounter (HOSPITAL_COMMUNITY): Payer: Medicare Other | Attending: Cardiovascular Disease

## 2011-06-19 DIAGNOSIS — Z951 Presence of aortocoronary bypass graft: Secondary | ICD-10-CM | POA: Insufficient documentation

## 2011-06-19 DIAGNOSIS — I059 Rheumatic mitral valve disease, unspecified: Secondary | ICD-10-CM | POA: Insufficient documentation

## 2011-06-19 DIAGNOSIS — Z5189 Encounter for other specified aftercare: Secondary | ICD-10-CM | POA: Insufficient documentation

## 2011-06-19 DIAGNOSIS — K219 Gastro-esophageal reflux disease without esophagitis: Secondary | ICD-10-CM | POA: Insufficient documentation

## 2011-06-19 DIAGNOSIS — E119 Type 2 diabetes mellitus without complications: Secondary | ICD-10-CM | POA: Insufficient documentation

## 2011-06-19 DIAGNOSIS — Z954 Presence of other heart-valve replacement: Secondary | ICD-10-CM | POA: Insufficient documentation

## 2011-06-19 DIAGNOSIS — I359 Nonrheumatic aortic valve disorder, unspecified: Secondary | ICD-10-CM | POA: Insufficient documentation

## 2011-06-19 DIAGNOSIS — I10 Essential (primary) hypertension: Secondary | ICD-10-CM | POA: Insufficient documentation

## 2011-06-21 ENCOUNTER — Encounter (HOSPITAL_COMMUNITY): Payer: Medicare Other

## 2011-06-22 DIAGNOSIS — D239 Other benign neoplasm of skin, unspecified: Secondary | ICD-10-CM | POA: Diagnosis not present

## 2011-06-22 DIAGNOSIS — L821 Other seborrheic keratosis: Secondary | ICD-10-CM | POA: Diagnosis not present

## 2011-06-23 ENCOUNTER — Encounter (HOSPITAL_COMMUNITY): Payer: Medicare Other

## 2011-06-26 ENCOUNTER — Encounter (HOSPITAL_COMMUNITY): Payer: Medicare Other

## 2011-06-28 ENCOUNTER — Encounter (HOSPITAL_COMMUNITY): Payer: Medicare Other

## 2011-06-30 ENCOUNTER — Encounter (HOSPITAL_COMMUNITY): Payer: Medicare Other

## 2011-07-03 ENCOUNTER — Encounter (HOSPITAL_COMMUNITY): Payer: Medicare Other

## 2011-07-05 ENCOUNTER — Encounter (HOSPITAL_COMMUNITY): Payer: Medicare Other

## 2011-07-07 ENCOUNTER — Encounter (HOSPITAL_COMMUNITY): Payer: Medicare Other

## 2011-07-10 ENCOUNTER — Encounter (HOSPITAL_COMMUNITY): Payer: Medicare Other

## 2011-07-12 ENCOUNTER — Encounter (HOSPITAL_COMMUNITY): Payer: Medicare Other

## 2011-07-14 ENCOUNTER — Encounter (HOSPITAL_COMMUNITY): Payer: Medicare Other

## 2011-07-17 ENCOUNTER — Other Ambulatory Visit: Payer: Self-pay

## 2011-07-17 ENCOUNTER — Encounter (HOSPITAL_COMMUNITY): Payer: Medicare Other

## 2011-07-17 ENCOUNTER — Ambulatory Visit (HOSPITAL_COMMUNITY): Payer: Medicare Other | Attending: Cardiovascular Disease

## 2011-07-17 ENCOUNTER — Encounter: Payer: Self-pay | Admitting: Cardiovascular Disease

## 2011-07-17 ENCOUNTER — Ambulatory Visit (INDEPENDENT_AMBULATORY_CARE_PROVIDER_SITE_OTHER): Payer: Medicare Other | Admitting: Cardiovascular Disease

## 2011-07-17 VITALS — BP 153/75 | HR 69 | Ht 66.0 in | Wt 165.1 lb

## 2011-07-17 DIAGNOSIS — Z954 Presence of other heart-valve replacement: Secondary | ICD-10-CM | POA: Insufficient documentation

## 2011-07-17 DIAGNOSIS — I2510 Atherosclerotic heart disease of native coronary artery without angina pectoris: Secondary | ICD-10-CM

## 2011-07-17 DIAGNOSIS — I519 Heart disease, unspecified: Secondary | ICD-10-CM | POA: Insufficient documentation

## 2011-07-17 DIAGNOSIS — I059 Rheumatic mitral valve disease, unspecified: Secondary | ICD-10-CM

## 2011-07-17 DIAGNOSIS — Z952 Presence of prosthetic heart valve: Secondary | ICD-10-CM

## 2011-07-17 DIAGNOSIS — I517 Cardiomegaly: Secondary | ICD-10-CM | POA: Insufficient documentation

## 2011-07-17 DIAGNOSIS — E785 Hyperlipidemia, unspecified: Secondary | ICD-10-CM | POA: Insufficient documentation

## 2011-07-17 DIAGNOSIS — E119 Type 2 diabetes mellitus without complications: Secondary | ICD-10-CM | POA: Diagnosis not present

## 2011-07-17 DIAGNOSIS — Z9889 Other specified postprocedural states: Secondary | ICD-10-CM

## 2011-07-17 DIAGNOSIS — I359 Nonrheumatic aortic valve disorder, unspecified: Secondary | ICD-10-CM | POA: Diagnosis not present

## 2011-07-17 LAB — LIPID PANEL
Cholesterol: 88 mg/dL (ref 0–200)
LDL Cholesterol: 35 mg/dL (ref 0–99)
Total CHOL/HDL Ratio: 2
VLDL: 12 mg/dL (ref 0.0–40.0)

## 2011-07-17 LAB — BASIC METABOLIC PANEL
BUN: 11 mg/dL (ref 6–23)
Chloride: 106 mEq/L (ref 96–112)
Potassium: 4.2 mEq/L (ref 3.5–5.1)
Sodium: 141 mEq/L (ref 135–145)

## 2011-07-17 LAB — HEPATIC FUNCTION PANEL
ALT: 20 U/L (ref 0–53)
AST: 19 U/L (ref 0–37)
Alkaline Phosphatase: 54 U/L (ref 39–117)
Total Bilirubin: 0.5 mg/dL (ref 0.3–1.2)

## 2011-07-17 NOTE — Assessment & Plan Note (Signed)
[name] is doing very well. I'm pleased that is not having episodes of chest pain or shortness of breath. We'll continue the same medications. I'll see him again in 6 months. We'll check fasting labs on him today and again in 6 months.

## 2011-07-17 NOTE — Patient Instructions (Signed)
Your physician wants you to follow-up in: 6 months You will receive a reminder letter in the mail two months in advance. If you don't receive a letter, please call our office to schedule the follow-up appointment.  Your physician recommends that you return for a FASTING lipid profile: today and in 6 months  

## 2011-07-17 NOTE — Progress Notes (Signed)
[name] Date of Birth  02-14-1936 [facility] [address]    [address] [place], [place]  [address] [phone]  Fax  [phone]    History of Present Illness:  [name] is a 76 year old gentleman with a history of coronary artery disease. He status post coronary artery bypass grafting, aortic valve replacement, and mitral valve repair. ( August 23, 2010).  He typically  walks on a regular basis. He has not been walking recently because of schedule issues but will be starting soon.  He had an echocardiogram earlier this morning to followup with his aortic valve replacement and mitral valve repair.  Current Outpatient Prescriptions on File Prior to Visit  Medication Sig Dispense Refill  . amLODipine (NORVASC) 5 MG tablet Take by mouth daily. Take 1/2 Tab Daily       . aspirin 325 MG tablet Take 325 mg by mouth daily.        [name] FLUoxetine (PROZAC) 20 MG tablet Take 20 mg by mouth daily.        . Melatonin 3 MG CAPS Take by mouth.        . metFORMIN (GLUMETZA) 1000 MG (MOD) 24 hr tablet Take 1,000 mg by mouth 2 (two) times daily with a meal.        . metoprolol tartrate (LOPRESSOR) 25 MG tablet Take 25 mg by mouth 2 (two) times daily.        . Multiple Vitamin (MULTIVITAMIN PO) Take by mouth.        [name] omeprazole (PRILOSEC) 20 MG capsule Take 20 mg by mouth daily.        . rosuvastatin (CRESTOR) 10 MG tablet Take 10 mg by mouth daily.        . vitamin C (ASCORBIC ACID) 500 MG tablet Take 500 mg by mouth 2 (two) times daily.          No Known Allergies  Past Medical History  Diagnosis Date  . Hyperlipidemia   . Diabetes mellitus   . Mitral valve prolapse   . Aortic valve regurgitation   . Degenerative disk disease   . Major depression   . Spinal stenosis   . Coronary artery disease     post stents  . Peripheral vascular disease   . Carotid artery occlusion     Past Surgical History  Procedure Date  . Appendectomy 1951  . Tonsillectomy 1956  .  Adenoidectomy 1956  . Spine surgery 1987    1987 - 1996 - 1997 - multiple spinal surgeries both in the C-spine and the lumbar     . Carotid endarterectomy 05/05/2003    right carotid endarterectomy done in 2005  . Shoulder surgery 01/07/2007    Left shoulder impingement, labral tear  . Cardiac catheterization 10/20/2003    Est. EF of 65% -- Critical disease in the mid to distal circumflex --  Preserved left ventricular  function --   . Cardiac catheterization 10/20/2003    Percutaneous coronary intervention/drug-eluding stent implantation, third marginal branch --  Percutaneous closure, right femoral artery -- Atherosclerotic coronary vascular disease, single vessel / Status post successful percutaneous coronary intervention/tandem drug -- eluding stent implantation proximal third marginal branch -- Typical angina was not reproduced with device insertion or balloon   . Hematoma evacuation 05/09/2003    Evacuation of hematoma, right neck  . Coronary artery bypass graft 08/29/2010    SVG to PDA, SVG to OM2- [facility]  . Aortic valve replacement 08/29/2010  25 mm CE Magna bioprosthetic - [facility]  . Mitral valve repair 08/23/2010    28 mm Simulus ring -[facility]    History  Smoking status  . Former Smoker -- 1.5 packs/day for 35 years  . Types: Cigarettes  . Quit date: 03/20/1984  Smokeless tobacco  . Never Used    History  Alcohol Use No    Family History  Problem Relation Age of Onset  . Diabetes Mother   . Coronary artery disease Mother   . Heart disease Mother   . Stroke Father   . Cancer Daughter     Reviw of Systems:  Reviewed in the HPI.  All other systems are negative.  Physical Exam: BP 153/75  Pulse 69  Ht 5\' 6"  (1.676 m)  Wt 165 lb 1.9 oz (74.898 kg)  BMI 26.65 kg/m2 The patient is alert and oriented x 3.  The mood and affect are normal.   Skin: warm and dry.  Color is normal.    HEENT:   Normal carotids, no JVD  Lungs: clear   Heart: RR, no murmurs     Abdomen: + BS  Extremities:  No edema  Neuro:  Non focal     ECG:  Assessment / Plan:

## 2011-07-19 ENCOUNTER — Encounter (HOSPITAL_COMMUNITY): Payer: Medicare Other

## 2011-07-21 ENCOUNTER — Encounter (HOSPITAL_COMMUNITY): Payer: Medicare Other

## 2011-07-24 ENCOUNTER — Encounter (HOSPITAL_COMMUNITY): Payer: Medicare Other

## 2011-07-26 ENCOUNTER — Encounter (HOSPITAL_COMMUNITY): Payer: Medicare Other

## 2011-07-27 DIAGNOSIS — I10 Essential (primary) hypertension: Secondary | ICD-10-CM | POA: Diagnosis not present

## 2011-07-27 DIAGNOSIS — K219 Gastro-esophageal reflux disease without esophagitis: Secondary | ICD-10-CM | POA: Diagnosis not present

## 2011-07-27 DIAGNOSIS — E119 Type 2 diabetes mellitus without complications: Secondary | ICD-10-CM | POA: Diagnosis not present

## 2011-07-28 ENCOUNTER — Encounter (HOSPITAL_COMMUNITY): Payer: Medicare Other

## 2011-07-31 ENCOUNTER — Encounter (HOSPITAL_COMMUNITY): Payer: Medicare Other

## 2011-08-02 ENCOUNTER — Encounter (HOSPITAL_COMMUNITY): Payer: Medicare Other

## 2011-08-04 ENCOUNTER — Encounter (HOSPITAL_COMMUNITY): Payer: Medicare Other

## 2011-08-07 ENCOUNTER — Encounter (HOSPITAL_COMMUNITY): Payer: Medicare Other

## 2011-08-09 ENCOUNTER — Encounter (HOSPITAL_COMMUNITY): Payer: Medicare Other

## 2011-08-11 ENCOUNTER — Encounter (HOSPITAL_COMMUNITY): Payer: Medicare Other

## 2011-08-14 ENCOUNTER — Encounter (HOSPITAL_COMMUNITY): Payer: Medicare Other

## 2011-08-16 ENCOUNTER — Encounter (HOSPITAL_COMMUNITY): Payer: Medicare Other

## 2011-08-18 ENCOUNTER — Encounter (HOSPITAL_COMMUNITY): Payer: Medicare Other

## 2011-08-21 ENCOUNTER — Encounter (HOSPITAL_COMMUNITY): Payer: Medicare Other

## 2011-08-23 ENCOUNTER — Encounter (HOSPITAL_COMMUNITY): Payer: Medicare Other

## 2011-08-25 ENCOUNTER — Encounter (HOSPITAL_COMMUNITY): Payer: Medicare Other

## 2011-08-28 ENCOUNTER — Encounter (HOSPITAL_COMMUNITY): Payer: Medicare Other

## 2011-08-30 ENCOUNTER — Encounter (HOSPITAL_COMMUNITY): Payer: Medicare Other

## 2011-09-01 ENCOUNTER — Encounter (HOSPITAL_COMMUNITY): Payer: Medicare Other

## 2011-09-04 ENCOUNTER — Encounter (HOSPITAL_COMMUNITY): Payer: Medicare Other

## 2011-09-06 ENCOUNTER — Encounter (HOSPITAL_COMMUNITY): Payer: Medicare Other

## 2011-09-08 ENCOUNTER — Encounter (HOSPITAL_COMMUNITY): Payer: Medicare Other

## 2011-09-11 ENCOUNTER — Encounter (HOSPITAL_COMMUNITY): Payer: Medicare Other

## 2011-09-13 ENCOUNTER — Encounter (HOSPITAL_COMMUNITY): Payer: Medicare Other

## 2011-09-15 ENCOUNTER — Encounter (HOSPITAL_COMMUNITY): Payer: Medicare Other

## 2011-09-18 ENCOUNTER — Encounter (HOSPITAL_COMMUNITY): Payer: Medicare Other

## 2011-09-20 ENCOUNTER — Encounter (HOSPITAL_COMMUNITY): Payer: Medicare Other

## 2011-09-22 ENCOUNTER — Encounter (HOSPITAL_COMMUNITY): Payer: Medicare Other

## 2011-09-25 ENCOUNTER — Encounter (HOSPITAL_COMMUNITY): Payer: Medicare Other

## 2011-09-27 ENCOUNTER — Encounter (HOSPITAL_COMMUNITY): Payer: Medicare Other

## 2011-09-29 ENCOUNTER — Encounter (HOSPITAL_COMMUNITY): Payer: Medicare Other

## 2011-10-02 ENCOUNTER — Encounter (HOSPITAL_COMMUNITY): Payer: Medicare Other

## 2011-10-04 ENCOUNTER — Encounter (HOSPITAL_COMMUNITY): Payer: Medicare Other

## 2011-10-06 ENCOUNTER — Encounter (HOSPITAL_COMMUNITY): Payer: Medicare Other

## 2011-10-09 ENCOUNTER — Encounter (HOSPITAL_COMMUNITY): Payer: Medicare Other

## 2011-10-11 ENCOUNTER — Encounter (HOSPITAL_COMMUNITY): Payer: Medicare Other

## 2011-10-13 ENCOUNTER — Encounter (HOSPITAL_COMMUNITY): Payer: Medicare Other

## 2011-10-16 ENCOUNTER — Encounter (HOSPITAL_COMMUNITY): Payer: Medicare Other

## 2011-10-18 ENCOUNTER — Encounter (HOSPITAL_COMMUNITY): Payer: Medicare Other

## 2011-10-20 ENCOUNTER — Other Ambulatory Visit: Payer: Self-pay | Admitting: Internal Medicine

## 2011-10-20 ENCOUNTER — Encounter (HOSPITAL_COMMUNITY): Payer: Medicare Other

## 2011-10-20 DIAGNOSIS — R42 Dizziness and giddiness: Secondary | ICD-10-CM

## 2011-10-23 ENCOUNTER — Encounter (HOSPITAL_COMMUNITY): Payer: Medicare Other

## 2011-10-25 ENCOUNTER — Encounter (HOSPITAL_COMMUNITY): Payer: Medicare Other

## 2011-10-26 ENCOUNTER — Ambulatory Visit
Admission: RE | Admit: 2011-10-26 | Discharge: 2011-10-26 | Disposition: A | Discharge status: Home / Self Care | Payer: Medicare Other | Source: Ambulatory Visit | Attending: Internal Medicine | Admitting: Internal Medicine

## 2011-10-26 DIAGNOSIS — R42 Dizziness and giddiness: Secondary | ICD-10-CM

## 2011-10-27 ENCOUNTER — Encounter (HOSPITAL_COMMUNITY): Payer: Medicare Other

## 2011-10-30 ENCOUNTER — Encounter (HOSPITAL_COMMUNITY): Payer: Medicare Other

## 2011-11-01 ENCOUNTER — Encounter (HOSPITAL_COMMUNITY): Payer: Medicare Other

## 2011-11-03 ENCOUNTER — Encounter (HOSPITAL_COMMUNITY): Payer: Medicare Other

## 2011-11-06 ENCOUNTER — Encounter (HOSPITAL_COMMUNITY): Payer: Medicare Other

## 2011-11-08 ENCOUNTER — Encounter (HOSPITAL_COMMUNITY): Payer: Medicare Other

## 2011-11-10 ENCOUNTER — Encounter (HOSPITAL_COMMUNITY): Payer: Medicare Other

## 2011-11-13 ENCOUNTER — Encounter (HOSPITAL_COMMUNITY): Payer: Medicare Other

## 2011-11-15 ENCOUNTER — Encounter (HOSPITAL_COMMUNITY): Payer: Medicare Other

## 2011-11-17 ENCOUNTER — Encounter (HOSPITAL_COMMUNITY): Payer: Medicare Other

## 2011-11-20 ENCOUNTER — Encounter (HOSPITAL_COMMUNITY): Payer: Medicare Other

## 2011-11-22 ENCOUNTER — Encounter (HOSPITAL_COMMUNITY): Payer: Medicare Other

## 2011-11-24 ENCOUNTER — Encounter (HOSPITAL_COMMUNITY): Payer: Medicare Other

## 2011-11-27 ENCOUNTER — Encounter (HOSPITAL_COMMUNITY): Payer: Medicare Other

## 2011-11-29 ENCOUNTER — Encounter (HOSPITAL_COMMUNITY): Payer: Medicare Other

## 2011-12-01 ENCOUNTER — Encounter (HOSPITAL_COMMUNITY): Payer: Medicare Other

## 2011-12-04 ENCOUNTER — Encounter (HOSPITAL_COMMUNITY): Payer: Medicare Other

## 2011-12-06 ENCOUNTER — Encounter (HOSPITAL_COMMUNITY): Payer: Medicare Other

## 2011-12-08 ENCOUNTER — Encounter (HOSPITAL_COMMUNITY): Payer: Medicare Other

## 2011-12-11 ENCOUNTER — Encounter (HOSPITAL_COMMUNITY): Payer: Medicare Other

## 2011-12-13 ENCOUNTER — Encounter (HOSPITAL_COMMUNITY): Payer: Medicare Other

## 2011-12-15 ENCOUNTER — Encounter (HOSPITAL_COMMUNITY): Payer: Medicare Other

## 2011-12-18 ENCOUNTER — Encounter (HOSPITAL_COMMUNITY): Payer: Medicare Other

## 2011-12-20 ENCOUNTER — Encounter (HOSPITAL_COMMUNITY): Payer: Medicare Other

## 2011-12-22 ENCOUNTER — Encounter (HOSPITAL_COMMUNITY): Payer: Medicare Other

## 2011-12-25 ENCOUNTER — Encounter (HOSPITAL_COMMUNITY): Payer: Medicare Other

## 2011-12-27 ENCOUNTER — Encounter (HOSPITAL_COMMUNITY): Payer: Medicare Other

## 2011-12-29 ENCOUNTER — Encounter (HOSPITAL_COMMUNITY): Payer: Medicare Other

## 2011-12-30 DIAGNOSIS — Z23 Encounter for immunization: Secondary | ICD-10-CM | POA: Diagnosis not present

## 2012-01-01 ENCOUNTER — Encounter (HOSPITAL_COMMUNITY): Payer: Medicare Other

## 2012-01-03 ENCOUNTER — Encounter (HOSPITAL_COMMUNITY): Payer: Medicare Other

## 2012-01-05 ENCOUNTER — Encounter (HOSPITAL_COMMUNITY): Payer: Medicare Other

## 2012-01-08 ENCOUNTER — Encounter (HOSPITAL_COMMUNITY): Payer: Medicare Other

## 2012-01-10 ENCOUNTER — Encounter (HOSPITAL_COMMUNITY): Payer: Medicare Other

## 2012-01-12 ENCOUNTER — Encounter (HOSPITAL_COMMUNITY): Payer: Medicare Other

## 2012-01-15 ENCOUNTER — Encounter (HOSPITAL_COMMUNITY): Payer: Medicare Other

## 2012-01-17 ENCOUNTER — Encounter (HOSPITAL_COMMUNITY): Payer: Medicare Other

## 2012-01-19 ENCOUNTER — Encounter (HOSPITAL_COMMUNITY): Payer: Medicare Other

## 2012-01-22 ENCOUNTER — Ambulatory Visit (INDEPENDENT_AMBULATORY_CARE_PROVIDER_SITE_OTHER): Payer: Medicare Other | Admitting: Cardiovascular Disease

## 2012-01-22 ENCOUNTER — Encounter (INDEPENDENT_AMBULATORY_CARE_PROVIDER_SITE_OTHER): Payer: Medicare Other

## 2012-01-22 ENCOUNTER — Encounter: Payer: Self-pay | Admitting: Cardiovascular Disease

## 2012-01-22 ENCOUNTER — Encounter (HOSPITAL_COMMUNITY): Payer: Medicare Other

## 2012-01-22 VITALS — BP 124/78 | HR 70 | Ht 66.0 in | Wt 165.0 lb

## 2012-01-22 DIAGNOSIS — R55 Syncope and collapse: Secondary | ICD-10-CM

## 2012-01-22 DIAGNOSIS — Z954 Presence of other heart-valve replacement: Secondary | ICD-10-CM

## 2012-01-22 DIAGNOSIS — I10 Essential (primary) hypertension: Secondary | ICD-10-CM | POA: Diagnosis not present

## 2012-01-22 DIAGNOSIS — I2510 Atherosclerotic heart disease of native coronary artery without angina pectoris: Secondary | ICD-10-CM | POA: Diagnosis not present

## 2012-01-22 DIAGNOSIS — Z952 Presence of prosthetic heart valve: Secondary | ICD-10-CM

## 2012-01-22 DIAGNOSIS — R011 Cardiac murmur, unspecified: Secondary | ICD-10-CM

## 2012-01-22 DIAGNOSIS — Z9889 Other specified postprocedural states: Secondary | ICD-10-CM

## 2012-01-22 NOTE — Progress Notes (Signed)
[name] Date of Birth  Mar 20, 1936 [facility] [address]    [address] [place], [place]  [address] [phone]  Fax  [phone]  Problem List 1. CAD, AVR, MV reapir 2. Dizziness   History of Present Illness:  [name] is a 76 year old gentleman with a history of coronary artery disease. He status post coronary artery bypass grafting, aortic valve replacement, and mitral valve repair. ( August 23, 2010).  He typically  walks on a regular basis. He has not been walking recently because of schedule issues but will be starting soon.  He had an echocardiogram earlier this morning to followup with his aortic valve replacement and mitral valve repair.  Nov. 4, 2013 - he presents today with some mild chest twinges - not similar to his angina.  Also has some dizziness / lightheadedness.  One episode occurred while he was sitting and a second episode occurred yesterday while he was running a leaf blower.  Current Outpatient Prescriptions on File Prior to Visit  Medication Sig Dispense Refill  . amLODipine (NORVASC) 5 MG tablet Take by mouth daily. Take 1/2 Tab Daily       . aspirin 325 MG tablet Take 325 mg by mouth daily.        [name] FLUoxetine (PROZAC) 20 MG tablet Take 20 mg by mouth daily.        . Melatonin 3 MG CAPS Take by mouth.        . metFORMIN (GLUMETZA) 1000 MG (MOD) 24 hr tablet Take 1,000 mg by mouth 2 (two) times daily with a meal.        . metoprolol tartrate (LOPRESSOR) 25 MG tablet Take 25 mg by mouth 2 (two) times daily.        . Multiple Vitamin (MULTIVITAMIN PO) Take by mouth.        [name] omeprazole (PRILOSEC) 20 MG capsule Take 20 mg by mouth daily.        . rosuvastatin (CRESTOR) 10 MG tablet Take 10 mg by mouth daily.        . vitamin C (ASCORBIC ACID) 500 MG tablet Take 500 mg by mouth 2 (two) times daily.          No Known Allergies  Past Medical History  Diagnosis Date  . Hyperlipidemia   . Diabetes mellitus   . Mitral valve prolapse   .  Aortic valve regurgitation   . Degenerative disk disease   . Major depression   . Spinal stenosis   . Coronary artery disease     post stents  . Peripheral vascular disease   . Carotid artery occlusion     Past Surgical History  Procedure Date  . Appendectomy 1951  . Tonsillectomy 1956  . Adenoidectomy 1956  . Spine surgery 1987    1987 - 1996 - 1997 - multiple spinal surgeries both in the C-spine and the lumbar     . Carotid endarterectomy 05/05/2003    right carotid endarterectomy done in 2005  . Shoulder surgery 01/07/2007    Left shoulder impingement, labral tear  . Cardiac catheterization 10/20/2003    Est. EF of 65% -- Critical disease in the mid to distal circumflex --  Preserved left ventricular  function --   . Cardiac catheterization 10/20/2003    Percutaneous coronary intervention/drug-eluding stent implantation, third marginal branch --  Percutaneous closure, right femoral artery -- Atherosclerotic coronary vascular disease, single vessel / Status post successful percutaneous coronary intervention/tandem drug -- eluding stent implantation  proximal third marginal branch -- Typical angina was not reproduced with device insertion or balloon   . Hematoma evacuation 05/09/2003    Evacuation of hematoma, right neck  . Coronary artery bypass graft 08/29/2010    SVG to PDA, SVG to OM2- [facility]  . Aortic valve replacement 08/29/2010    25 mm CE Magna bioprosthetic - [facility]  . Mitral valve repair 08/23/2010    28 mm Simulus ring -[facility]    History  Smoking status  . Former Smoker -- 1.5 packs/day for 35 years  . Types: Cigarettes  . Quit date: 03/20/1984  Smokeless tobacco  . Never Used    History  Alcohol Use No    Family History  Problem Relation Age of Onset  . Diabetes Mother   . Coronary artery disease Mother   . Heart disease Mother   . Stroke Father   . Cancer Daughter     Reviw of Systems:  Reviewed in the HPI.  All other systems are  negative.  Physical Exam: BP 124/78  Pulse 70  Ht 5\' 6"  (1.676 m)  Wt 165 lb (74.844 kg)  BMI 26.63 kg/m2 The patient is alert and oriented x 3.  The mood and affect are normal.   Skin: warm and dry.  Color is normal.    HEENT:   Normal carotids, no JVD, right CEA scar, left neck scar from cervical spine surgery.  Lungs: clear   Heart: RR,  He has a 2/6 diastolic murmur c/w AI    Abdomen: + BS  Extremities:  No edema  Neuro:  Non focal     ECG: Nov. 4, 2013 - NSR at 70  Assessment / Plan:

## 2012-01-22 NOTE — Patient Instructions (Signed)
Your physician has recommended that you wear an event monitor//placed ecardio monitor today. Event monitors are medical devices that record the heart's electrical activity. Doctors most often [place] these monitors to diagnose arrhythmias. Arrhythmias are problems with the speed or rhythm of the heartbeat. The monitor is a small, portable device. You can wear one while you do your normal daily activities. This is usually used to diagnose what is causing palpitations/syncope (passing out).  Your physician has requested that you have an echocardiogram. Echocardiography is a painless test that uses sound waves to create images of your heart. It provides your doctor with information about the size and shape of your heart and how well your heart's chambers and valves are working. This procedure takes approximately one hour. There are no restrictions for this procedure.  Your physician recommends that you schedule a follow-up appointment in: 4-6 weeks

## 2012-01-22 NOTE — Assessment & Plan Note (Signed)
Stable

## 2012-01-22 NOTE — Assessment & Plan Note (Signed)
His exam is c/w mild AI - which is a new finding.  Will repeat his echo.  I dont think this AI is the cause of his syncopal episode.

## 2012-01-22 NOTE — Assessment & Plan Note (Signed)
[name] has had some syncopal episodes recently.  They do not sound like orthostatis to me.  One episode occurred while he was sitting down.  We will place a 30 day event monitor on him.

## 2012-01-24 ENCOUNTER — Encounter (HOSPITAL_COMMUNITY): Payer: Medicare Other

## 2012-01-25 ENCOUNTER — Other Ambulatory Visit (HOSPITAL_COMMUNITY): Payer: Self-pay | Admitting: Cardiovascular Disease

## 2012-01-25 ENCOUNTER — Encounter: Payer: Self-pay | Admitting: *Deleted

## 2012-01-25 ENCOUNTER — Ambulatory Visit (HOSPITAL_COMMUNITY): Payer: Medicare Other | Attending: Internal Medicine | Admitting: Radiology

## 2012-01-25 DIAGNOSIS — R55 Syncope and collapse: Secondary | ICD-10-CM | POA: Diagnosis not present

## 2012-01-25 DIAGNOSIS — I379 Nonrheumatic pulmonary valve disorder, unspecified: Secondary | ICD-10-CM | POA: Diagnosis not present

## 2012-01-25 DIAGNOSIS — E119 Type 2 diabetes mellitus without complications: Secondary | ICD-10-CM | POA: Diagnosis not present

## 2012-01-25 DIAGNOSIS — I059 Rheumatic mitral valve disease, unspecified: Secondary | ICD-10-CM

## 2012-01-25 DIAGNOSIS — R011 Cardiac murmur, unspecified: Secondary | ICD-10-CM | POA: Diagnosis not present

## 2012-01-25 DIAGNOSIS — I10 Essential (primary) hypertension: Secondary | ICD-10-CM | POA: Insufficient documentation

## 2012-01-25 DIAGNOSIS — I369 Nonrheumatic tricuspid valve disorder, unspecified: Secondary | ICD-10-CM | POA: Diagnosis not present

## 2012-01-25 DIAGNOSIS — I359 Nonrheumatic aortic valve disorder, unspecified: Secondary | ICD-10-CM | POA: Diagnosis not present

## 2012-01-25 NOTE — Progress Notes (Signed)
Echocardiogram performed.  

## 2012-01-26 ENCOUNTER — Encounter (HOSPITAL_COMMUNITY): Payer: Medicare Other

## 2012-01-29 ENCOUNTER — Encounter (HOSPITAL_COMMUNITY): Payer: Medicare Other

## 2012-01-30 DIAGNOSIS — K219 Gastro-esophageal reflux disease without esophagitis: Secondary | ICD-10-CM | POA: Diagnosis not present

## 2012-01-30 DIAGNOSIS — Z1331 Encounter for screening for depression: Secondary | ICD-10-CM | POA: Diagnosis not present

## 2012-01-30 DIAGNOSIS — I10 Essential (primary) hypertension: Secondary | ICD-10-CM | POA: Diagnosis not present

## 2012-01-30 DIAGNOSIS — Z125 Encounter for screening for malignant neoplasm of prostate: Secondary | ICD-10-CM | POA: Diagnosis not present

## 2012-01-30 DIAGNOSIS — Z0000 Encounter for general adult medical examination without abnormal findings: Secondary | ICD-10-CM | POA: Diagnosis not present

## 2012-01-30 DIAGNOSIS — E785 Hyperlipidemia, unspecified: Secondary | ICD-10-CM | POA: Diagnosis not present

## 2012-01-30 DIAGNOSIS — E119 Type 2 diabetes mellitus without complications: Secondary | ICD-10-CM | POA: Diagnosis not present

## 2012-01-31 ENCOUNTER — Encounter (HOSPITAL_COMMUNITY): Payer: Medicare Other

## 2012-02-02 ENCOUNTER — Encounter (HOSPITAL_COMMUNITY): Payer: Medicare Other

## 2012-02-05 ENCOUNTER — Encounter (HOSPITAL_COMMUNITY): Payer: Medicare Other

## 2012-02-07 ENCOUNTER — Encounter (HOSPITAL_COMMUNITY): Payer: Medicare Other

## 2012-02-09 ENCOUNTER — Encounter (HOSPITAL_COMMUNITY): Payer: Medicare Other

## 2012-02-12 ENCOUNTER — Encounter (HOSPITAL_COMMUNITY): Payer: Medicare Other

## 2012-02-14 ENCOUNTER — Encounter (HOSPITAL_COMMUNITY): Payer: Medicare Other

## 2012-02-16 ENCOUNTER — Encounter (HOSPITAL_COMMUNITY): Payer: Medicare Other

## 2012-02-22 ENCOUNTER — Telehealth: Payer: Self-pay | Admitting: *Deleted

## 2012-02-22 NOTE — Telephone Encounter (Signed)
Left msg with wife, ecardio monitor was ok/ no harmful arrhthymias seen. Verbalized understanding.

## 2012-03-04 ENCOUNTER — Encounter: Payer: Self-pay | Admitting: Cardiovascular Disease

## 2012-03-04 ENCOUNTER — Ambulatory Visit (INDEPENDENT_AMBULATORY_CARE_PROVIDER_SITE_OTHER): Payer: Medicare Other | Admitting: Cardiovascular Disease

## 2012-03-04 VITALS — BP 144/70 | HR 72 | Ht 66.0 in | Wt 170.8 lb

## 2012-03-04 DIAGNOSIS — Z954 Presence of other heart-valve replacement: Secondary | ICD-10-CM | POA: Diagnosis not present

## 2012-03-04 DIAGNOSIS — I2510 Atherosclerotic heart disease of native coronary artery without angina pectoris: Secondary | ICD-10-CM

## 2012-03-04 DIAGNOSIS — Z952 Presence of prosthetic heart valve: Secondary | ICD-10-CM

## 2012-03-04 NOTE — Patient Instructions (Signed)
Your physician wants you to follow-up in: 6 months with Dr Prescott Parma will receive a reminder letter in the mail two months in advance. If you don't receive a letter, please call our office to schedule the follow-up appointment.  Your physician recommends that you return for lab work in: 6 months (fasting Lipid, Liver panel and BMP)

## 2012-03-04 NOTE — Progress Notes (Signed)
[name] Date of Birth  11/07/1935 [facility] [address]    [address] [place], [place]  [address] [phone]  Fax  [phone]  Problem List 1. CAD, AVR, MV reapir 2. Dizziness   History of Present Illness:  [name] is a 76 year old gentleman with a history of coronary artery disease. He status post coronary artery bypass grafting, aortic valve replacement, and mitral valve repair. ( August 23, 2010).  He typically  walks on a regular basis. He has not been walking recently because of schedule issues but will be starting soon.  He had an echocardiogram earlier this morning to followup with his aortic valve replacement and mitral valve repair.  Nov. 4, 2013 - he presents today with some mild chest twinges - not similar to his angina.  Also has some dizziness / lightheadedness.  One episode occurred while he was sitting and a second episode occurred yesterday while he was running a leaf blower.  Dec. 16, 2013:  He continues to have mild episodes of the room spinning.  The other day he had severe episodes while lying down - symptoms are c/w vertigo. [name] He also has had some episodes of chest twinges. These last for 1-2 seconds. They're not necessarily associated with twisting or turning of his torso. They're not associated with exercise.  He has not been exercising much.   Current Outpatient Prescriptions on File Prior to Visit  Medication Sig Dispense Refill  . amLODipine (NORVASC) 5 MG tablet Take by mouth daily. Take 1/2 Tab Daily       . aspirin 325 MG tablet Take 325 mg by mouth daily.        [name] FLUoxetine (PROZAC) 20 MG tablet Take 20 mg by mouth daily.        . Melatonin 3 MG CAPS Take by mouth.        . metFORMIN (GLUMETZA) 1000 MG (MOD) 24 hr tablet Take 1,000 mg by mouth 2 (two) times daily with a meal.        . metoprolol tartrate (LOPRESSOR) 25 MG tablet Take 25 mg by mouth 2 (two) times daily.        . Multiple Vitamin (MULTIVITAMIN PO) Take by mouth.         [name] omeprazole (PRILOSEC) 20 MG capsule Take 20 mg by mouth daily.        . rosuvastatin (CRESTOR) 10 MG tablet Take 10 mg by mouth daily.        . vitamin C (ASCORBIC ACID) 500 MG tablet Take 500 mg by mouth 2 (two) times daily.          No Known Allergies  Past Medical History  Diagnosis Date  . Hyperlipidemia   . Diabetes mellitus   . Mitral valve prolapse   . Aortic valve regurgitation   . Degenerative disk disease   . Major depression   . Spinal stenosis   . Coronary artery disease     post stents  . Peripheral vascular disease   . Carotid artery occlusion     Past Surgical History  Procedure Date  . Appendectomy 1951  . Tonsillectomy 1956  . Adenoidectomy 1956  . Spine surgery 1987    1987 - 1996 - 1997 - multiple spinal surgeries both in the C-spine and the lumbar     . Carotid endarterectomy 05/05/2003    right carotid endarterectomy done in 2005  . Shoulder surgery 01/07/2007    Left shoulder impingement, labral tear  .  Cardiac catheterization 10/20/2003    Est. EF of 65% -- Critical disease in the mid to distal circumflex --  Preserved left ventricular  function --   . Cardiac catheterization 10/20/2003    Percutaneous coronary intervention/drug-eluding stent implantation, third marginal branch --  Percutaneous closure, right femoral artery -- Atherosclerotic coronary vascular disease, single vessel / Status post successful percutaneous coronary intervention/tandem drug -- eluding stent implantation proximal third marginal branch -- Typical angina was not reproduced with device insertion or balloon   . Hematoma evacuation 05/09/2003    Evacuation of hematoma, right neck  . Coronary artery bypass graft 08/29/2010    SVG to PDA, SVG to OM2- [facility]  . Aortic valve replacement 08/29/2010    25 mm CE Magna bioprosthetic - [facility]  . Mitral valve repair 08/23/2010    28 mm Simulus ring -[facility]    History  Smoking status  . Former Smoker -- 1.5 packs/day for 35 years   . Types: Cigarettes  . Quit date: 03/20/1984  Smokeless tobacco  . Never Used    History  Alcohol Use No    Family History  Problem Relation Age of Onset  . Diabetes Mother   . Coronary artery disease Mother   . Heart disease Mother   . Stroke Father   . Cancer Daughter     Reviw of Systems:  Reviewed in the HPI.  All other systems are negative.  Physical Exam: BP 144/70  Pulse 72  Ht 5\' 6"  (1.676 m)  Wt 170 lb 12.8 oz (77.474 kg)  BMI 27.57 kg/m2  SpO2 98% The patient is alert and oriented x 3.  The mood and affect are normal.   Skin: warm and dry.  Color is normal.    HEENT:   Normal carotids, no JVD, right CEA scar, left neck scar from cervical spine surgery.  Lungs: clear   Heart: RR,  He has a 1/6 diastolic murmur c/w AI  .  He has a soft systolic murmur  Abdomen: + BS  Extremities:  No edema  Neuro:  Non focal     ECG:    Assessment / Plan:

## 2012-03-04 NOTE — Assessment & Plan Note (Signed)
His valve is stable. He has mild aortic insufficiency.

## 2012-03-04 NOTE — Assessment & Plan Note (Signed)
[name] is doing well. He's not having any episodes of angina. He has occasional chest twinges which are not consistent with coronary artery disease. I think that he he's having some musculoskeletal pain related to his surgery.  I've encouraged him to get out and walk more. He was told that he could not go out below 45. I told him that he could wear warm clothing and go out in most weather. He does not have unstable angina and I don't think that he  We'll have any problems exercising through the winter.  We'll check fasting labs when I see him again for an office visit in 6 months.

## 2012-03-05 ENCOUNTER — Encounter: Payer: Self-pay | Admitting: *Deleted

## 2012-03-06 DIAGNOSIS — H2510 Age-related nuclear cataract, unspecified eye: Secondary | ICD-10-CM | POA: Diagnosis not present

## 2012-03-06 DIAGNOSIS — H18529 Epithelial (juvenile) corneal dystrophy, unspecified eye: Secondary | ICD-10-CM | POA: Insufficient documentation

## 2012-03-06 DIAGNOSIS — E119 Type 2 diabetes mellitus without complications: Secondary | ICD-10-CM | POA: Insufficient documentation

## 2012-03-20 DIAGNOSIS — I639 Cerebral infarction, unspecified: Secondary | ICD-10-CM

## 2012-03-20 HISTORY — DX: Cerebral infarction, unspecified: I63.9

## 2012-03-26 ENCOUNTER — Encounter: Payer: Self-pay | Admitting: Cardiovascular Disease

## 2012-03-26 ENCOUNTER — Observation Stay (HOSPITAL_COMMUNITY)
Admission: AD | Admit: 2012-03-26 | Discharge: 2012-03-27 | Disposition: A | Discharge status: Home / Self Care | Payer: Medicare Other | Source: Ambulatory Visit | Attending: Cardiovascular Disease | Admitting: Cardiovascular Disease

## 2012-03-26 ENCOUNTER — Telehealth: Payer: Self-pay | Admitting: Cardiovascular Disease

## 2012-03-26 ENCOUNTER — Encounter (HOSPITAL_COMMUNITY): Payer: Self-pay | Admitting: General Practice

## 2012-03-26 ENCOUNTER — Ambulatory Visit (INDEPENDENT_AMBULATORY_CARE_PROVIDER_SITE_OTHER): Payer: Medicare Other | Admitting: Cardiovascular Disease

## 2012-03-26 VITALS — BP 148/80 | HR 72 | Ht 66.0 in | Wt 174.4 lb

## 2012-03-26 DIAGNOSIS — Z951 Presence of aortocoronary bypass graft: Secondary | ICD-10-CM | POA: Insufficient documentation

## 2012-03-26 DIAGNOSIS — I2510 Atherosclerotic heart disease of native coronary artery without angina pectoris: Secondary | ICD-10-CM | POA: Diagnosis not present

## 2012-03-26 DIAGNOSIS — Z954 Presence of other heart-valve replacement: Secondary | ICD-10-CM

## 2012-03-26 DIAGNOSIS — Z79899 Other long term (current) drug therapy: Secondary | ICD-10-CM | POA: Insufficient documentation

## 2012-03-26 DIAGNOSIS — Z952 Presence of prosthetic heart valve: Secondary | ICD-10-CM

## 2012-03-26 DIAGNOSIS — E119 Type 2 diabetes mellitus without complications: Secondary | ICD-10-CM | POA: Insufficient documentation

## 2012-03-26 DIAGNOSIS — R079 Chest pain, unspecified: Principal | ICD-10-CM

## 2012-03-26 DIAGNOSIS — E785 Hyperlipidemia, unspecified: Secondary | ICD-10-CM | POA: Insufficient documentation

## 2012-03-26 DIAGNOSIS — Z7902 Long term (current) use of antithrombotics/antiplatelets: Secondary | ICD-10-CM | POA: Diagnosis not present

## 2012-03-26 DIAGNOSIS — Z87891 Personal history of nicotine dependence: Secondary | ICD-10-CM | POA: Diagnosis not present

## 2012-03-26 DIAGNOSIS — Z9889 Other specified postprocedural states: Secondary | ICD-10-CM

## 2012-03-26 HISTORY — DX: Obstructive sleep apnea (adult) (pediatric): G47.33

## 2012-03-26 HISTORY — DX: Essential (primary) hypertension: I10

## 2012-03-26 HISTORY — DX: Angina pectoris, unspecified: I20.9

## 2012-03-26 HISTORY — DX: Type 2 diabetes mellitus without complications: E11.9

## 2012-03-26 HISTORY — DX: Personal history of other diseases of the digestive system: Z87.19

## 2012-03-26 HISTORY — DX: Calculus of kidney: N20.0

## 2012-03-26 HISTORY — DX: Unspecified malignant neoplasm of skin of other part of trunk: C44.509

## 2012-03-26 HISTORY — DX: Other specified malignant neoplasm of skin of other part of trunk: C44.599

## 2012-03-26 HISTORY — DX: Unspecified malignant neoplasm of skin of nose: C44.301

## 2012-03-26 LAB — MRSA PCR SCREENING: MRSA by PCR: NEGATIVE

## 2012-03-26 LAB — COMPREHENSIVE METABOLIC PANEL
ALT: 15 U/L (ref 0–53)
Alkaline Phosphatase: 68 U/L (ref 39–117)
BUN: 12 mg/dL (ref 6–23)
CO2: 26 mEq/L (ref 19–32)
Chloride: 102 mEq/L (ref 96–112)
GFR calc Af Amer: >90 mL/min (ref >90)
GFR calc non Af Amer: 88 mL/min — ABNORMAL LOW (ref >90)
Glucose, Bld: 123 mg/dL — ABNORMAL HIGH (ref 70–99)
Potassium: 4.2 mEq/L (ref 3.5–5.1)
Sodium: 140 mEq/L (ref 135–145)
Total Bilirubin: 0.4 mg/dL (ref 0.3–1.2)

## 2012-03-26 LAB — CBC WITH DIFFERENTIAL/PLATELET
Hemoglobin: 11.1 g/dL — ABNORMAL LOW (ref 13.0–17.0)
Lymphocytes Relative: 32 % (ref 12–46)
Lymphs Abs: 3.4 10*3/uL (ref 0.7–4.0)
MCV: 88.7 fL (ref 78.0–100.0)
Monocytes Relative: 7 % (ref 3–12)
Neutrophils Relative %: 56 % (ref 43–77)
Platelets: 267 10*3/uL (ref 150–400)
RBC: 3.71 MIL/uL — ABNORMAL LOW (ref 4.22–5.81)
WBC: 10.5 10*3/uL (ref 4.0–10.5)

## 2012-03-26 MED ORDER — AMLODIPINE BESYLATE 5 MG PO TABS
5.0000 mg | ORAL_TABLET | Freq: Every day | ORAL | Status: DC
  Administered 2012-03-26: 5 mg via ORAL
  Filled 2012-03-26 (×2): qty 1

## 2012-03-26 MED ORDER — PANTOPRAZOLE SODIUM 40 MG PO TBEC: 40.0000 mg | DELAYED_RELEASE_TABLET | Freq: Every day | ORAL | Status: DC

## 2012-03-26 MED ORDER — ASPIRIN EC 81 MG PO TBEC
81.0000 mg | DELAYED_RELEASE_TABLET | Freq: Every day | ORAL | Status: DC
  Filled 2012-03-26: qty 1

## 2012-03-26 MED ORDER — ASPIRIN 300 MG RE SUPP
300.0000 mg | RECTAL | Status: AC
  Filled 2012-03-26: qty 1

## 2012-03-26 MED ORDER — NITROGLYCERIN IN D5W 200-5 MCG/ML-% IV SOLN
2.0000 ug/min | INTRAVENOUS | Status: DC
  Administered 2012-03-26: 10 ug/min via INTRAVENOUS
  Filled 2012-03-26: qty 250

## 2012-03-26 MED ORDER — ACETAMINOPHEN 325 MG PO TABS
650.0000 mg | ORAL_TABLET | ORAL | Status: DC | PRN
  Administered 2012-03-27 (×2): 650 mg via ORAL
  Filled 2012-03-26: qty 2

## 2012-03-26 MED ORDER — FLUOXETINE HCL 20 MG PO TABS
20.0000 mg | ORAL_TABLET | Freq: Every day | ORAL | Status: DC
  Administered 2012-03-26: 20 mg via ORAL
  Filled 2012-03-26 (×2): qty 1

## 2012-03-26 MED ORDER — ASPIRIN 81 MG PO CHEW
324.0000 mg | CHEWABLE_TABLET | ORAL | Status: AC
  Administered 2012-03-26: 324 mg via ORAL
  Filled 2012-03-26: qty 4

## 2012-03-26 MED ORDER — SODIUM CHLORIDE 0.9 % IV SOLN: 250.0000 mL | INTRAVENOUS | Status: DC | PRN

## 2012-03-26 MED ORDER — ATORVASTATIN CALCIUM 40 MG PO TABS
40.0000 mg | ORAL_TABLET | Freq: Every day | ORAL | Status: DC
  Filled 2012-03-26 (×3): qty 1

## 2012-03-26 MED ORDER — MORPHINE SULFATE 2 MG/ML IJ SOLN
2.0000 mg | INTRAMUSCULAR | Status: DC | PRN
  Administered 2012-03-26 – 2012-03-27 (×2): 2 mg via INTRAVENOUS
  Filled 2012-03-26 (×2): qty 1

## 2012-03-26 MED ORDER — ONDANSETRON HCL 4 MG/2ML IJ SOLN
4.0000 mg | Freq: Four times a day (QID) | INTRAMUSCULAR | Status: DC | PRN
  Administered 2012-03-27: 4 mg via INTRAVENOUS
  Filled 2012-03-26: qty 2

## 2012-03-26 MED ORDER — HEPARIN (PORCINE) IN NACL 100-0.45 UNIT/ML-% IJ SOLN
950.0000 [IU]/h | INTRAMUSCULAR | Status: DC
  Administered 2012-03-26: 950 [IU]/h via INTRAVENOUS
  Filled 2012-03-26 (×2): qty 250

## 2012-03-26 MED ORDER — NITROGLYCERIN 0.4 MG SL SUBL
0.4000 mg | SUBLINGUAL_TABLET | SUBLINGUAL | Status: DC | PRN
  Administered 2012-03-26 (×2): 0.4 mg via SUBLINGUAL
  Filled 2012-03-26 (×3): qty 25

## 2012-03-26 MED ORDER — ZOLPIDEM TARTRATE 5 MG PO TABS
5.0000 mg | ORAL_TABLET | Freq: Every evening | ORAL | Status: DC | PRN
  Administered 2012-03-26: 5 mg via ORAL
  Filled 2012-03-26: qty 1

## 2012-03-26 MED ORDER — SODIUM CHLORIDE 0.9 % IJ SOLN
3.0000 mL | Freq: Two times a day (BID) | INTRAMUSCULAR | Status: DC
  Administered 2012-03-26: 3 mL via INTRAVENOUS

## 2012-03-26 MED ORDER — SODIUM CHLORIDE 0.9 % IJ SOLN: 3.0000 mL | INTRAMUSCULAR | Status: DC | PRN

## 2012-03-26 MED ORDER — ASPIRIN 325 MG PO TABS
325.0000 mg | ORAL_TABLET | Freq: Every day | ORAL | Status: DC
  Filled 2012-03-26: qty 1

## 2012-03-26 MED ORDER — HEPARIN BOLUS VIA INFUSION
4000.0000 [IU] | Freq: Once | INTRAVENOUS | Status: AC
  Filled 2012-03-26: qty 4000

## 2012-03-26 MED ORDER — METFORMIN HCL ER 500 MG PO TB24
1000.0000 mg | ORAL_TABLET | Freq: Two times a day (BID) | ORAL | Status: DC
  Administered 2012-03-26: 1000 mg via ORAL
  Filled 2012-03-26 (×3): qty 2

## 2012-03-26 MED ORDER — METOPROLOL TARTRATE 25 MG PO TABS
25.0000 mg | ORAL_TABLET | Freq: Two times a day (BID) | ORAL | Status: DC
  Administered 2012-03-26: 25 mg via ORAL
  Filled 2012-03-26 (×3): qty 1

## 2012-03-26 NOTE — Progress Notes (Signed)
ANTICOAGULATION CONSULT NOTE - Initial Consult  Pharmacy Consult for Heparin Indication: chest pain/ACS  No Known Allergies  Patient Measurements: Height: 5\' 6"  (167.6 cm) Weight: 174 lb 6.4 oz (79.107 kg) IBW/kg (Calculated) : 63.8  Heparin Dosing Weight: 79 kg  Vital Signs: BP: 148/80 mmHg (01/07 1349) Pulse Rate: 72  (01/07 1349)  Labs: No results found for this basename: HGB:2,HCT:3,PLT:3,APTT:3,LABPROT:3,INR:3,HEPARINUNFRC:3,CREATININE:3,CKTOTAL:3,CKMB:3,TROPONINI:3 in the last 72 hours  Estimated Creatinine Clearance: 80.1 ml/min (by C-G formula based on Cr of 0.8).   Medical History: Past Medical History  Diagnosis Date  . Hyperlipidemia   . Diabetes mellitus   . Mitral valve prolapse   . Aortic valve regurgitation   . Degenerative disk disease   . Major depression   . Spinal stenosis   . Coronary artery disease     post stents  . Peripheral vascular disease   . Carotid artery occlusion     Assessment: 26 YOM with hx of CAD presented with 2 day of chest discomfort, ECG with no ST or T wave changes, cardiac enzymes pending, likely for cath tomorrow. Pharmacy is consulted to start IV heparin. Pt. Doesn't take anticoagulation prior to admission. Baseline cbc and PT/INR are pending  Goal of Therapy:  Heparin level 0.3-0.7 units/ml Monitor platelets by anticoagulation protocol: Yes   Plan:  - Heparin bolus 4000 units/hr - Heparin infusion 950 units/hr - Check heparin level 8 hrs after infusion start - f/u daily cbc and heparin level.  [name], PharmD, BCPS  Clinical Pharmacist  Pager: [phone]  03/26/2012,4:03 PM

## 2012-03-26 NOTE — Telephone Encounter (Signed)
Patient called due to chest pain on left side of his chest even down to his left rib cage. The pain has been going on for the last few days; this morning the pain  started to be worse, score of "7"  dull pain, it gets better when pt drinks hot beverages. Now the pain score is a "3" Pt denies SOB, dizziness, nauseas cold sweats nor the pain gets worse when taken deep breaths. An appointment was made for an ov with Dr. Elease Hashimoto for today at 1:45 PM pt aware; he verbalized understanding.

## 2012-03-26 NOTE — Progress Notes (Signed)
[name] Date of Birth  20-Jun-1935 [facility] [address]    [address] [place], [place]  [address] [phone]  Fax  [phone]  Problem List 1. CAD, AVR, MV reapir 2. Dizziness   History of Present Illness:  [name] is a 77 year old gentleman with a history of coronary artery disease. He status post coronary artery bypass grafting, aortic valve replacement, and mitral valve repair. ( August 23, 2010).  He typically  walks on a regular basis. He has not been walking recently because of schedule issues but will be starting soon.  He had an echocardiogram earlier this morning to followup with his aortic valve replacement and mitral valve repair.  Nov. 4, 2013 - he presents today with some mild chest twinges - not similar to his angina.  Also has some dizziness / lightheadedness.  One episode occurred while he was sitting and a second episode occurred yesterday while he was running a leaf blower.  Dec. 16, 2013: He continues to have mild episodes of the room spinning.  The other day he had severe episodes while lying down - symptoms are c/w vertigo. [name] He also has had some episodes of chest twinges. These last for 1-2 seconds. They're not necessarily associated with twisting or turning of his torso. They're not associated with exercise.  He has not been exercising much.   March 26, 2012: He presents today with 2 days of chest discomfort.  The pains are  similar to his angina pain but not exactly the same.  This pain is more on the left side and his previous episodes of angina ( before his stents) were  The pain is a constant ache for the past 2 days.  There is no associated dyspnea.  The pains are not worsened with walking.  He was walking regularly until 2 days ago when the weather turned cold.  He has not tried any NTG.  He rates the pain at 7/10 last night.  The pains seems to ease with hot coffee and hot soup and currently the pain is 3/10.  We gave his a NTG in the  office and the pain completely resolved.    ECG was NSR with no ST or T wave changes.  Current Outpatient Prescriptions on File Prior to Visit  Medication Sig Dispense Refill  . amLODipine (NORVASC) 5 MG tablet Take by mouth daily. Take 1/2 Tab Daily       . aspirin 325 MG tablet Take 325 mg by mouth daily.        [name] FLUoxetine (PROZAC) 20 MG tablet Take 20 mg by mouth daily.        . Melatonin 3 MG CAPS Take by mouth.        . metFORMIN (GLUMETZA) 1000 MG (MOD) 24 hr tablet Take 1,000 mg by mouth 2 (two) times daily with a meal.        . metoprolol tartrate (LOPRESSOR) 25 MG tablet Take 25 mg by mouth 2 (two) times daily.        . Multiple Vitamin (MULTIVITAMIN PO) Take by mouth.        [name] omeprazole (PRILOSEC) 20 MG capsule Take 20 mg by mouth daily.        . rosuvastatin (CRESTOR) 10 MG tablet Take 10 mg by mouth daily.        . vitamin C (ASCORBIC ACID) 500 MG tablet Take 500 mg by mouth 2 (two) times daily.  No Known Allergies  Past Medical History  Diagnosis Date  . Hyperlipidemia   . Diabetes mellitus   . Mitral valve prolapse   . Aortic valve regurgitation   . Degenerative disk disease   . Major depression   . Spinal stenosis   . Coronary artery disease     post stents  . Peripheral vascular disease   . Carotid artery occlusion     Past Surgical History  Procedure Date  . Appendectomy 1951  . Tonsillectomy 1956  . Adenoidectomy 1956  . Spine surgery 1987    1987 - 1996 - 1997 - multiple spinal surgeries both in the C-spine and the lumbar     . Carotid endarterectomy 05/05/2003    right carotid endarterectomy done in 2005  . Shoulder surgery 01/07/2007    Left shoulder impingement, labral tear  . Cardiac catheterization 10/20/2003    Est. EF of 65% -- Critical disease in the mid to distal circumflex --  Preserved left ventricular  function --   . Cardiac catheterization 10/20/2003    Percutaneous coronary intervention/drug-eluding stent implantation,  third marginal branch --  Percutaneous closure, right femoral artery -- Atherosclerotic coronary vascular disease, single vessel / Status post successful percutaneous coronary intervention/tandem drug -- eluding stent implantation proximal third marginal branch -- Typical angina was not reproduced with device insertion or balloon   . Hematoma evacuation 05/09/2003    Evacuation of hematoma, right neck  . Coronary artery bypass graft 08/29/2010    SVG to PDA, SVG to OM2- [facility]  . Aortic valve replacement 08/29/2010    25 mm CE Magna bioprosthetic - [facility]  . Mitral valve repair 08/23/2010    28 mm Simulus ring -[facility]    History  Smoking status  . Former Smoker -- 1.5 packs/day for 35 years  . Types: Cigarettes  . Quit date: 03/20/1984  Smokeless tobacco  . Never Used    History  Alcohol Use No    Family History  Problem Relation Age of Onset  . Diabetes Mother   . Coronary artery disease Mother   . Heart disease Mother   . Stroke Father   . Cancer Daughter     Reviw of Systems:  Reviewed in the HPI.  All other systems are negative.  Physical Exam: BP 148/80  Pulse 72  Ht 5\' 6"  (1.676 m)  Wt 174 lb 6.4 oz (79.107 kg)  BMI 28.15 kg/m2  SpO2 98% The patient is alert and oriented x 3.  The mood and affect are normal.   Skin: warm and dry.  Color is normal.    HEENT:   Normal carotids, no JVD, right CEA scar, left neck scar from cervical spine surgery.  Lungs: clear   Heart: RR,  He has a 2/6 diastolic murmur c/w AI  .  He has a soft systolic murmur  Abdomen: + BS  Extremities:  No edema  Neuro:  Non focal     ECG:  03/26/2012-normal sinus rhythm at 69. He has no ST or T wave changes.  Assessment / Plan:

## 2012-03-26 NOTE — Progress Notes (Signed)
Pt. Cont to have chest pain NTG drip titrated up x2, bp stable. EKG done, Dr. Mayford Knife was paged with orders made. Will cont. to monitor pain and follow up with the MD.

## 2012-03-26 NOTE — Assessment & Plan Note (Signed)
He has mild AI by echo. Stable

## 2012-03-26 NOTE — Telephone Encounter (Signed)
Per pt his whole left side of his chest is hurting no other symptoms

## 2012-03-26 NOTE — Progress Notes (Signed)
Pt. Had episode of chest pain scale of 4/10 whil sitting and eating his snack. Lt. Chest pain given ntg SL x1 and 12 lead ekg done. With good relief from NTG [facility] mad aware with orders made. to start ntg drip. Will cont . To monitor pt.

## 2012-03-26 NOTE — Assessment & Plan Note (Signed)
[name]  presents today with chest pain for the past 2 days. The chest pains were very similar to his previous episodes of angina before he had stenting. He had bypass grafting in 2012.  The pain resolved after one sublingual nitroglycerin.  Given this recent unstable angina, I think that he has a high likelihood of having significant coronary artery disease.  We will admit him to the hospital we'll start him on heparin drip and nitroglycerin drip. We will anticipate a cardiac catheterization tomorrow.

## 2012-03-26 NOTE — Progress Notes (Signed)
Called bed management for tele/ step down unit, trish made aware, pt will go to 2600 unit. Pt called wife to pick up which was ok per dr Elease Hashimoto. Pt will be wheeled to vehicle when arrives.

## 2012-03-26 NOTE — Progress Notes (Signed)
Pt. Has episode of chest pain 6/10  ntg drip titrate up to . bp stable, after 5 mins-10 mins still  Up to 7/10 will do 12 lead ekg ntg up to . Will cont monitor pain and follow up with the md.

## 2012-03-26 NOTE — Patient Instructions (Addendum)
Go to admitting at [facility], you will be admitted to 2600 unit,

## 2012-03-26 NOTE — H&P (Signed)
[facility]  [address]  [place], [place] [address]  [phone] Fax [phone]  Problem List  1. CAD, AVR, MV reapir  2. Dizziness  History of Present Illness:  [name] is a 77 year old gentleman with a history of coronary artery disease. He status post coronary artery bypass grafting, aortic valve replacement, and mitral valve repair. ( August 23, 2010).  He typically walks on a regular basis. He has not been walking recently because of schedule issues but will be starting soon.  He had an echocardiogram earlier this morning to followup with his aortic valve replacement and mitral valve repair.  Nov. 4, 2013 - he presents today with some mild chest twinges - not similar to his angina. Also has some dizziness / lightheadedness. One episode occurred while he was sitting and a second episode occurred yesterday while he was running a leaf blower.  Dec. 16, 2013:  He continues to have mild episodes of the room spinning. The other day he had severe episodes while lying down - symptoms are c/w vertigo. [name] He also has had some episodes of chest twinges. These last for 1-2 seconds. They're not necessarily associated with twisting or turning of his torso. They're not associated with exercise. He has not been exercising much.  March 26, 2012:  He presents today with 2 days of chest discomfort. The pains are similar to his angina pain but not exactly the same. This pain is more on the left side and his previous episodes of angina ( before his stents) were The pain is a constant ache for the past 2 days. There is no associated dyspnea. The pains are not worsened with walking. He was walking regularly until 2 days ago when the weather turned cold. He has not tried any NTG. He rates the pain at 7/10 last night. The pains seems to ease with hot coffee and hot soup and currently the pain is 3/10.  We gave his a NTG in the office and the pain completely resolved.  ECG was NSR with no ST or T wave  changes.  Current Outpatient Prescriptions on File Prior to Visit   Medication  Sig  Dispense  Refill   .  amLODipine (NORVASC) 5 MG tablet  Take by mouth daily. Take 1/2 Tab Daily     .  aspirin 325 MG tablet  Take 325 mg by mouth daily.     [name]  FLUoxetine (PROZAC) 20 MG tablet  Take 20 mg by mouth daily.     .  Melatonin 3 MG CAPS  Take by mouth.     .  metFORMIN (GLUMETZA) 1000 MG (MOD) 24 hr tablet  Take 1,000 mg by mouth 2 (two) times daily with a meal.     .  metoprolol tartrate (LOPRESSOR) 25 MG tablet  Take 25 mg by mouth 2 (two) times daily.     .  Multiple Vitamin (MULTIVITAMIN PO)  Take by mouth.     [name]  omeprazole (PRILOSEC) 20 MG capsule  Take 20 mg by mouth daily.     .  rosuvastatin (CRESTOR) 10 MG tablet  Take 10 mg by mouth daily.     .  vitamin C (ASCORBIC ACID) 500 MG tablet  Take 500 mg by mouth 2 (two) times daily.      No Known Allergies  Past Medical History   Diagnosis  Date   .  Hyperlipidemia    .  Diabetes mellitus    .  Mitral  valve prolapse    .  Aortic valve regurgitation    .  Degenerative disk disease    .  Major depression    .  Spinal stenosis    .  Coronary artery disease      post stents   .  Peripheral vascular disease    .  Carotid artery occlusion     Past Surgical History   Procedure  Date   .  Appendectomy  1951   .  Tonsillectomy  1956   .  Adenoidectomy  1956   .  Spine surgery  1987     1987 - 1996 - 1997 - multiple spinal surgeries both in the C-spine and the lumbar   .  Carotid endarterectomy  05/05/2003     right carotid endarterectomy done in 2005   .  Shoulder surgery  01/07/2007     Left shoulder impingement, labral tear   .  Cardiac catheterization  10/20/2003     Est. EF of 65% -- Critical disease in the mid to distal circumflex -- Preserved left ventricular function --   .  Cardiac catheterization  10/20/2003     Percutaneous coronary intervention/drug-eluding stent implantation, third marginal branch -- Percutaneous  closure, right femoral artery -- Atherosclerotic coronary vascular disease, single vessel / Status post successful percutaneous coronary intervention/tandem drug -- eluding stent implantation proximal third marginal branch -- Typical angina was not reproduced with device insertion or balloon   .  Hematoma evacuation  05/09/2003     Evacuation of hematoma, right neck   .  Coronary artery bypass graft  08/29/2010     SVG to PDA, SVG to OM2- [facility]   .  Aortic valve replacement  08/29/2010     25 mm CE Magna bioprosthetic - [facility]   .  Mitral valve repair  08/23/2010     28 mm Simulus ring -[facility]    History   Smoking status   .  Former Smoker -- 1.5 packs/day for 35 years   .  Types:  Cigarettes   .  Quit date:  03/20/1984   Smokeless tobacco   .  Never Used    History   Alcohol Use  No    Family History   Problem  Relation  Age of Onset   .  Diabetes  Mother    .  Coronary artery disease  Mother    .  Heart disease  Mother    .  Stroke  Father    .  Cancer  Daughter     Reviw of Systems:  Reviewed in the HPI. All other systems are negative.  Physical Exam:  BP 148/80  Pulse 72  Ht 5\' 6"  (1.676 m)  Wt 174 lb 6.4 oz (79.107 kg)  BMI 28.15 kg/m2  SpO2 98%  The patient is alert and oriented x 3. The mood and affect are normal.  Skin: warm and dry. Color is normal.  HEENT: Normal carotids, no JVD, right CEA scar, left neck scar from cervical spine surgery.  Lungs: clear  Heart: RR, He has a 2/6 diastolic murmur c/w AI . He has a soft systolic murmur  Abdomen: + BS  Extremities: No edema  Neuro: Non focal   ECG:  03/26/2012-normal sinus rhythm at 69. He has no ST or T wave changes.   Assessment / Plan:     CAD (coronary artery disease) - Elyn Aquas., MD 03/26/2012 2:33 PM Signed  [name] presents today  with chest pain for the past 2 days. The chest pains were very similar to his previous episodes of angina before he had stenting. He had bypass grafting in 2012.  The  pain resolved after one sublingual nitroglycerin.  Given this recent unstable angina, I think that he has a high likelihood of having significant coronary artery disease. We will admit him to the hospital we'll start him on heparin drip and nitroglycerin drip. We will anticipate a cardiac catheterization tomorrow.  S/P AVR (aortic valve replacement) - Elyn Aquas., MD 03/26/2012 2:35 PM Signed  He has mild AI by echo.  Stable   Vesta Mixer, Montez Hageman., MD, [facility] 03/26/2012, 5:16 PM Office - [phone] Pager [phone]  Mahalia Longest JODETTE, RN 03/26/2012 2:43 PM Signed  Called bed management for tele/ step down unit, trish made aware, pt will go to 2600 unit. Pt called wife to pick up which was ok per dr Elease Hashimoto. Pt will be wheeled to vehicle when arrives.  Sable Feil, RN 03/26/2012 3:01 PM Signed  Pt taken to lobby by w/c for pick up, currently pain free.

## 2012-03-26 NOTE — Progress Notes (Signed)
Pt taken to lobby by w/c for pick up, currently pain free.

## 2012-03-26 NOTE — Addendum Note (Signed)
Addended by: Antony Odea on: 03/26/2012 03:23 PM   Modules accepted: Orders

## 2012-03-27 ENCOUNTER — Observation Stay (HOSPITAL_COMMUNITY): Payer: Medicare Other

## 2012-03-27 ENCOUNTER — Encounter (HOSPITAL_COMMUNITY)
Admission: AD | Disposition: A | Discharge status: Home / Self Care | Payer: Self-pay | Source: Ambulatory Visit | Attending: Cardiovascular Disease

## 2012-03-27 DIAGNOSIS — I2510 Atherosclerotic heart disease of native coronary artery without angina pectoris: Secondary | ICD-10-CM

## 2012-03-27 DIAGNOSIS — Z01818 Encounter for other preprocedural examination: Secondary | ICD-10-CM | POA: Diagnosis not present

## 2012-03-27 DIAGNOSIS — R079 Chest pain, unspecified: Secondary | ICD-10-CM

## 2012-03-27 DIAGNOSIS — I700 Atherosclerosis of aorta: Secondary | ICD-10-CM | POA: Diagnosis not present

## 2012-03-27 DIAGNOSIS — Z954 Presence of other heart-valve replacement: Secondary | ICD-10-CM

## 2012-03-27 HISTORY — PX: LEFT HEART CATHETERIZATION WITH CORONARY ANGIOGRAM: SHX5451

## 2012-03-27 LAB — CBC
HCT: 31.8 % — ABNORMAL LOW (ref 39.0–52.0)
MCHC: 33.3 g/dL (ref 30.0–36.0)
RDW: 13.4 % (ref 11.5–15.5)

## 2012-03-27 LAB — TROPONIN I: Troponin I: <0.3 ng/mL (ref <0.30)

## 2012-03-27 LAB — HEPARIN LEVEL (UNFRACTIONATED)
Heparin Unfractionated: 0.43 IU/mL (ref 0.30–0.70)
Heparin Unfractionated: 0.38 IU/mL (ref 0.30–0.70)

## 2012-03-27 LAB — LIPID PANEL
LDL Cholesterol: 41 mg/dL (ref 0–99)
Triglycerides: 207 mg/dL — ABNORMAL HIGH (ref <150)

## 2012-03-27 SURGERY — LEFT HEART CATHETERIZATION WITH CORONARY ANGIOGRAM
Anesthesia: LOCAL

## 2012-03-27 MED ORDER — MIDAZOLAM HCL 2 MG/2ML IJ SOLN
INTRAMUSCULAR | Status: AC
  Filled 2012-03-27: qty 2

## 2012-03-27 MED ORDER — SODIUM CHLORIDE 0.9 % IV SOLN: 1.0000 mL/kg/h | INTRAVENOUS | Status: DC

## 2012-03-27 MED ORDER — ASPIRIN 81 MG PO CHEW
324.0000 mg | CHEWABLE_TABLET | ORAL | Status: AC
  Administered 2012-03-27: 324 mg via ORAL
  Filled 2012-03-27: qty 4

## 2012-03-27 MED ORDER — SODIUM CHLORIDE 0.9 % IJ SOLN: 3.0000 mL | Freq: Two times a day (BID) | INTRAMUSCULAR | Status: DC

## 2012-03-27 MED ORDER — SODIUM CHLORIDE 0.9 % IJ SOLN: 3.0000 mL | INTRAMUSCULAR | Status: DC | PRN

## 2012-03-27 MED ORDER — FENTANYL CITRATE 0.05 MG/ML IJ SOLN
INTRAMUSCULAR | Status: AC
  Filled 2012-03-27: qty 2

## 2012-03-27 MED ORDER — HEPARIN (PORCINE) IN NACL 2-0.9 UNIT/ML-% IJ SOLN
INTRAMUSCULAR | Status: AC
  Filled 2012-03-27: qty 1000

## 2012-03-27 MED ORDER — METFORMIN HCL ER (MOD) 1000 MG PO TB24: 1000.0000 mg | ORAL_TABLET | Freq: Two times a day (BID) | ORAL | Status: DC

## 2012-03-27 MED ORDER — SODIUM CHLORIDE 0.9 % IV SOLN
1.0000 mL/kg/h | INTRAVENOUS | Status: DC
  Administered 2012-03-27: 1 mL/kg/h via INTRAVENOUS

## 2012-03-27 MED ORDER — NITROGLYCERIN 0.2 MG/ML ON CALL CATH LAB
INTRAVENOUS | Status: AC
  Filled 2012-03-27: qty 1

## 2012-03-27 MED ORDER — LIDOCAINE HCL (PF) 1 % IJ SOLN
INTRAMUSCULAR | Status: AC
  Filled 2012-03-27: qty 30

## 2012-03-27 MED ORDER — ISOSORBIDE MONONITRATE ER 30 MG PO TB24: 30.0000 mg | ORAL_TABLET | Freq: Every day | ORAL | Status: DC

## 2012-03-27 MED ORDER — SODIUM CHLORIDE 0.9 % IV SOLN: 250.0000 mL | INTRAVENOUS | Status: DC | PRN

## 2012-03-27 NOTE — Progress Notes (Signed)
Pts. Chest pain was relieved after a dose of morphine, resting comfortably.

## 2012-03-27 NOTE — Discharge Summary (Signed)
Discharge Summary   Patient ID: [name],  MRN: [id number], DOB/AGE: [birth year] 77 y.o.  Admit date: 03/26/2012 Discharge date: 03/27/2012  Primary Physician: [name] Mountain, MD Primary Cardiologist: [name] Ginger, MD  Discharge Diagnoses Principal Problem:  *Chest pain, moderate coronary artery risk Active Problems:  CAD (coronary artery disease)  S/P AVR (aortic valve replacement)  S/P MVR (mitral valve repair)   Allergies No Known Allergies  Diagnostic Studies/Procedures  CHEST X-RAY - 03/27/12  IMPRESSION:  Minimal bibasilar atelectasis or scarring.  CARDIAC CATHETERIZATION - 03/27/12  Hemodynamics:  AO 113/58 with a mean of 81 mmHg  LV N/A  Coronary angiography:  Coronary dominance: right  Left mainstem: The left main coronary is moderately calcified with mild narrowing up to 20%.  Left anterior descending (LAD): The left anterior descending artery is a long tortuous vessel. It has diffuse disease in the proximal to mid vessel up to 20-30%. No obstructive disease noted.  Left circumflex (LCx): The left circumflex coronary has a 50% narrowing at origin. There is a stent present in the mid circumflex before the third marginal branch. The stent is patent. The first obtuse marginal vessel has a focal 70% stenosis in the proximal to mid vessel. The second marginal branch has diffuse 70% in the proximal to mid vessel. The third obtuse marginal vessel has competitive flow from the vein graft but appears to have a 60% stenosis proximally.  Right coronary artery (RCA): the right coronary is a dominant vessel. It is very tortuous in the proximal and mid vessel. It is moderately calcified throughout. In the proximal to mid vessel there is diffuse disease up to 50%. There is 40-50% disease distally. The PDA is a small caliber vessel with an 80% ostial stenosis. The posterior lateral branch fills by the vein graft.  The saphenous vein graft to the posterior lateral branch  of the right coronary is widely patent.  The saphenous vein graft to the third obtuse marginal vessel is widely patent.  Left ventriculography: not performed  Final Conclusions:  1. 2 vessel obstructive coronary disease.  2. Patent saphenous vein graft to the OM 3  3. Patent saphenous vein graft to the PL OM of the RCA.   History of Present Illness/Hospital Course   Mr. [name] is a 77 yo gentleman who was direct-admitted from the office on 03/26/12 with the above problem list to undergo diagnostic cardiac catheterization after endorsing two days of left-sided chest discomfort consistent with prior angina and responsive to NTG. He was continued on outpatient medications. A CXR was obtained as above and revealed minimal bibasilar atelectasis vs scarring. He was informed, consent and prepped for cath which is described in full above. This was notable for 2 vessel obstructive CAD, patent SVG-OM3, SVG-PL OM of RCA. LVgram was deferred. He tolerated the procedure well without complications. The recommendation was made to continue medical therapy. Low-dose Imdur was added for antianginal benefit. He remained stable post-cath and was deemed stable for discharge by Dr. [place]. He will follow-up in the office in 2-4 weeks to evaluate responsiveness to Imdur. Of note, a TSH was ordered and returned WNL. A lipid panel revealed LDL 41, HDL 38, TG 207, TC 120. He will resume metformin in 48 hours post cath to avoid nephrotoxicity. This information, including post-cath instructions, has been clearly outlined in the discharge AVS.   Discharge Vitals:  Blood pressure 122/64, pulse 70, temperature 97.7 F (36.5 C), temperature source Oral, resp. rate 18, height 5\' 6"  (1.676 m),  weight 79.107 kg (174 lb 6.4 oz), SpO2 95.00%.   Labs: Recent Labs  Basename 03/27/12 0430 03/26/12 1734   WBC 12.1* 10.5   HGB 10.6* 11.1*   HCT 31.8* 32.9*   MCV 89.6 88.7   PLT 275 267    Lab 03/26/12 1734  NA 140  K 4.2  CL  102  CO2 26  BUN 12  CREATININE 0.75  CALCIUM 9.2  PROT 7.1  BILITOT 0.4  ALKPHOS 68  ALT 15  AST 19  AMYLASE --  LIPASE --  GLUCOSE 123*   Recent Labs  Basename 03/27/12 0430 03/26/12 2327 03/26/12 1735   CKTOTAL -- -- --   CKMB -- -- --   CKMBINDEX -- -- --   TROPONINI <0.30 <0.30 <0.30   Recent Labs  Basename 03/27/12 0430   CHOL 120   HDL 38*   LDLCALC 41   TRIG 207*   CHOLHDL 3.2   LDLDIRECT --    Basename 03/26/12 1734  TSH 2.847  T4TOTAL --  T3FREE --  THYROIDAB --    Disposition:  Discharge Orders    Future Orders Please Complete By Expires   Diet - low sodium heart healthy      Increase activity slowly        Follow-up Information    Follow up with [facility]. (Office will call with appointment date & time. )    Contact information:   [address] [place] [address]         Discharge Medications:    Medication List     As of 03/27/2012  5:48 PM    START taking these medications         isosorbide mononitrate 30 MG 24 hr tablet   Commonly known as: IMDUR   Take 1 tablet (30 mg total) by mouth daily.      CONTINUE taking these medications         amLODipine 5 MG tablet   Commonly known as: NORVASC      aspirin 325 MG tablet      FLUoxetine 20 MG capsule   Commonly known as: PROZAC      Melatonin 3 MG Tabs      metFORMIN 1000 MG (MOD) 24 hr tablet   Commonly known as: GLUMETZA   Take 1 tablet (1,000 mg total) by mouth 2 (two) times daily with a meal.   Start taking on: 03/29/2012      metoprolol tartrate 25 MG tablet   Commonly known as: LOPRESSOR      MULTIVITAMIN PO      nitroGLYCERIN 0.4 MG SL tablet   Commonly known as: NITROSTAT      omeprazole 20 MG capsule   Commonly known as: PRILOSEC      rosuvastatin 10 MG tablet   Commonly known as: CRESTOR      vitamin C 500 MG tablet   Commonly known as: ASCORBIC ACID          Where to get your medications    These are the prescriptions  that you need to pick up. We sent them to a specific pharmacy, so you will need to go there to get them.   [id number] - [place], [place] - [address]    [address] [place] [address]    Phone: [phone]        isosorbide mononitrate 30 MG 24 hr tablet         Information  on where to get these meds is not yet available. Ask your nurse or doctor.         metFORMIN 1000 MG (MOD) 24 hr tablet            Outstanding Labs/Studies: None  Duration of Discharge Encounter: Greater than 30 minutes including physician time.  Signed, R. Hurman Horn, PA-C 03/27/2012, 5:48 PM

## 2012-03-27 NOTE — H&P (Signed)
[facility]  [address]  [place], [place] [address]  [phone] Fax [phone]  Problem List  1. CAD, AVR, MV reapir  2. Dizziness  History of Present Illness:  [name] is a 77 year old gentleman with a history of coronary artery disease. He status post coronary artery bypass grafting, aortic valve replacement, and mitral valve repair. ( August 23, 2010).  He typically walks on a regular basis. He has not been walking recently because of schedule issues but will be starting soon.  He had an echocardiogram earlier this morning to followup with his aortic valve replacement and mitral valve repair.    He presented with 2 days of chest discomfort. The pains are similar to his angina pain but not exactly the same. This pain is more on the left side and his previous episodes of angina ( before his stents) were The pain is a constant ache for the past 2 days. There is no associated dyspnea. The pains are not worsened with walking. He was walking regularly until 2 days ago when the weather turned cold. He has not tried any NTG. He rates the pain at 7/10 last night. The pains seems to ease with hot coffee and hot soup and currently the pain is 3/10.   We gave his a NTG in the office and the pain completely resolved.  ECG was NSR with no ST or T wave changes.   Current Outpatient Prescriptions on File Prior to Visit   Medication  Sig  Dispense  Refill   .  amLODipine (NORVASC) 5 MG tablet  Take by mouth daily. Take 1/2 Tab Daily     .  aspirin 325 MG tablet  Take 325 mg by mouth daily.     [name]  FLUoxetine (PROZAC) 20 MG tablet  Take 20 mg by mouth daily.     .  Melatonin 3 MG CAPS  Take by mouth.     .  metFORMIN (GLUMETZA) 1000 MG (MOD) 24 hr tablet  Take 1,000 mg by mouth 2 (two) times daily with a meal.     .  metoprolol tartrate (LOPRESSOR) 25 MG tablet  Take 25 mg by mouth 2 (two) times daily.     .  Multiple Vitamin (MULTIVITAMIN PO)  Take by mouth.     [name]  omeprazole  (PRILOSEC) 20 MG capsule  Take 20 mg by mouth daily.     .  rosuvastatin (CRESTOR) 10 MG tablet  Take 10 mg by mouth daily.     .  vitamin C (ASCORBIC ACID) 500 MG tablet  Take 500 mg by mouth 2 (two) times daily.      No Known Allergies  Past Medical History   Diagnosis  Date   .  Hyperlipidemia    .  Diabetes mellitus    .  Mitral valve prolapse    .  Aortic valve regurgitation    .  Degenerative disk disease    .  Major depression    .  Spinal stenosis    .  Coronary artery disease      post stents   .  Peripheral vascular disease    .  Carotid artery occlusion     Past Surgical History   Procedure  Date   .  Appendectomy  1951   .  Tonsillectomy  1956   .  Adenoidectomy  1956   .  Spine surgery  1987     1987 - 1996 - 1997 -  multiple spinal surgeries both in the C-spine and the lumbar   .  Carotid endarterectomy  05/05/2003     right carotid endarterectomy done in 2005   .  Shoulder surgery  01/07/2007     Left shoulder impingement, labral tear   .  Cardiac catheterization  10/20/2003     Est. EF of 65% -- Critical disease in the mid to distal circumflex -- Preserved left ventricular function --   .  Cardiac catheterization  10/20/2003     Percutaneous coronary intervention/drug-eluding stent implantation, third marginal branch -- Percutaneous closure, right femoral artery -- Atherosclerotic coronary vascular disease, single vessel / Status post successful percutaneous coronary intervention/tandem drug -- eluding stent implantation proximal third marginal branch -- Typical angina was not reproduced with device insertion or balloon   .  Hematoma evacuation  05/09/2003     Evacuation of hematoma, right neck   .  Coronary artery bypass graft  08/29/2010     SVG to PDA, SVG to OM2- [facility]   .  Aortic valve replacement  08/29/2010     25 mm CE Magna bioprosthetic - [facility]   .  Mitral valve repair  08/23/2010     28 mm Simulus ring -[facility]    History   Smoking status   .   Former Smoker -- 1.5 packs/day for 35 years   .  Types:  Cigarettes   .  Quit date:  03/20/1984   Smokeless tobacco   .  Never Used    History   Alcohol Use  No    Family History   Problem  Relation  Age of Onset   .  Diabetes  Mother    .  Coronary artery disease  Mother    .  Heart disease  Mother    .  Stroke  Father    .  Cancer  Daughter     Reviw of Systems:  Reviewed in the HPI. All other systems are negative.  Physical Exam:  BP 105/67  Pulse 70  Temp 98.3 F (36.8 C) (Oral)  Resp 18  Ht 5\' 6"  (1.676 m)  Wt 174 lb 6.4 oz (79.107 kg)  BMI 28.15 kg/m2  SpO2 96%  The patient is alert and oriented x 3. The mood and affect are normal.  Skin: warm and dry. Color is normal.  HEENT: Normal carotids, no JVD, right CEA scar, left neck scar from cervical spine surgery.  Lungs: clear  Heart: RR, He has a 2/6 diastolic murmur c/w AI . He has a soft systolic murmur  Abdomen: + BS  Extremities: No edema  Neuro: Non focal   ECG:  03/26/2012-normal sinus rhythm at 69. He has no ST or T wave changes.   Assessment / Plan:   CAD (coronary artery disease) - Elyn Aquas., MD 03/26/2012 2:33 PM Signed  [name] presents today with chest pain for the past 2 days. The chest pains were very similar to his previous episodes of angina before he had stenting. He had bypass grafting in 2012.  The pain resolved after one sublingual nitroglycerin.  Troponin levels are negative.  He is scheduled for cath today.  Orders done   S/P  Bioprosthetic AVR (aortic valve replacement) - Elyn Aquas., MD 03/26/2012 2:35 PM Signed  He has mild AI by echo.  Stable.  I do not necessarily think we need to cross the AoV.     Hyperlipidemia:   Lipids are great on current medications.  Lab Results  Component Value Date   CHOL 120 03/27/2012   HDL 38* 03/27/2012   LDLCALC 41 03/27/2012   TRIG 207* 03/27/2012   CHOLHDL 3.2 03/27/2012     Vesta Mixer, Montez Hageman., MD, [facility] 03/27/2012, 8:23 AM Office -  [phone] Pager [phone]

## 2012-03-27 NOTE — Progress Notes (Addendum)
Pt ambulated in hall w/ brisk steady gait.  Denies complaints.  Discussed heart healthy diet.  Reviewed d/c instructions w/ pt and wife.  D/c home with wife via w/c with NT Sonja.

## 2012-03-27 NOTE — Discharge Summary (Signed)
Patient seen and examined and history reviewed. Agree with above findings and plan. Based on cath findings will start low dose nitrates and have him follow up with Dr. Elease Hashimoto.  [name] 03/27/2012 6:02 PM

## 2012-03-27 NOTE — Progress Notes (Signed)
ANTICOAGULATION CONSULT NOTE - Follow Up Consult  Pharmacy Consult for Heparin Indication: chest pain/ACS  No Known Allergies  Patient Measurements: Height: 5\' 6"  (167.6 cm) Weight: 174 lb 6.4 oz (79.107 kg) IBW/kg (Calculated) : 63.8  Heparin Dosing Weight: 79 kg  Vital Signs: Temp: 97.7 F (36.5 C) (01/08 0846) Temp src: Oral (01/08 0846) BP: 105/67 mmHg (01/08 0700) Pulse Rate: 73  (01/08 0846)  Labs:  Basename 03/27/12 0430 03/26/12 2327 03/26/12 1735 03/26/12 1734  HGB 10.6* -- -- 11.1*  HCT 31.8* -- -- 32.9*  PLT 275 -- -- 267  APTT -- -- -- --  LABPROT -- -- -- 14.6  INR -- -- -- 1.16  HEPARINUNFRC 0.38 0.43 -- --  CREATININE -- -- -- 0.75  CKTOTAL -- -- -- --  CKMB -- -- -- --  TROPONINI <0.30 <0.30 <0.30 --    Estimated Creatinine Clearance: 80.1 ml/min (by C-G formula based on Cr of 0.75).   Medical History: Past Medical History  Diagnosis Date  . Hyperlipidemia   . Mitral valve prolapse   . Aortic valve regurgitation   . Degenerative disk disease   . Major depression   . Spinal stenosis   . Coronary artery disease     post stents  . Skin cancer of nose 2012    "right side" (03/26/2012)  . Skin cancer of trunk     "left chest" (03/26/2012)  . Hypertension   . Carotid artery occlusion   . Heart murmur   . Anginal pain   . OSA (obstructive sleep apnea)     "have a mask; haven't used one in years" (03/26/2012)  . Type II diabetes mellitus   . History of esophageal stricture     "has it stretched q once in awhile" (03/26/2012"  . Kidney stones     Assessment: 64 YOM with hx of CAD presented with 2 day of chest discomfort, ECG with no ST or T wave changes. Heparin is therapeutic.  Goal of Therapy:  Heparin level 0.3-0.7 units/ml Monitor platelets by anticoagulation protocol: Yes   Plan:  1. Continue IV heparin at 950 units/hr.  2. F/u after cath

## 2012-03-27 NOTE — Interval H&P Note (Signed)
History and Physical Interval Note:  03/27/2012 2:38 PM  [name]  has presented today for surgery, with the diagnosis of Chest pain  The various methods of treatment have been discussed with the patient and family. After consideration of risks, benefits and other options for treatment, the patient has consented to  Procedure(s) (LRB) with comments: LEFT HEART CATHETERIZATION WITH CORONARY ANGIOGRAM (N/A) as a surgical intervention .  The patient's history has been reviewed, patient examined, no change in status, stable for surgery.  I have reviewed the patient's chart and labs.  Questions were answered to the patient's satisfaction.     Theron Arista [facility] 03/27/2012 2:38 PM

## 2012-03-27 NOTE — Progress Notes (Signed)
ANTICOAGULATION CONSULT NOTE - Follow Up Consult  Pharmacy Consult for Heparin Indication: chest pain/ACS  No Known Allergies  Patient Measurements: Height: 5\' 6"  (167.6 cm) Weight: 174 lb 6.4 oz (79.107 kg) IBW/kg (Calculated) : 63.8  Heparin Dosing Weight: 79 kg  Vital Signs: Temp: 98 F (36.7 C) (01/08 0011) Temp src: Oral (01/08 0011) BP: 136/68 mmHg (01/08 0011) Pulse Rate: 74  (01/08 0011)  Labs:  Basename 03/26/12 2327 03/26/12 1735 03/26/12 1734  HGB -- -- 11.1*  HCT -- -- 32.9*  PLT -- -- 267  APTT -- -- --  LABPROT -- -- 14.6  INR -- -- 1.16  HEPARINUNFRC 0.43 -- --  CREATININE -- -- 0.75  CKTOTAL -- -- --  CKMB -- -- --  TROPONINI -- <0.30 --    Estimated Creatinine Clearance: 80.1 ml/min (by C-G formula based on Cr of 0.75).   Medical History: Past Medical History  Diagnosis Date  . Hyperlipidemia   . Mitral valve prolapse   . Aortic valve regurgitation   . Degenerative disk disease   . Major depression   . Spinal stenosis   . Coronary artery disease     post stents  . Skin cancer of nose 2012    "right side" (03/26/2012)  . Skin cancer of trunk     "left chest" (03/26/2012)  . Hypertension   . Carotid artery occlusion   . Heart murmur   . Anginal pain   . OSA (obstructive sleep apnea)     "have a mask; haven't used one in years" (03/26/2012)  . Type II diabetes mellitus   . History of esophageal stricture     "has it stretched q once in awhile" (03/26/2012"  . Kidney stones     Assessment: 7 YOM with hx of CAD presented with 2 day of chest discomfort, ECG with no ST or T wave changes, cardiac enzymes pending, likely for cath tomorrow. Pharmacy is consulted to start IV heparin.   Heparin level (0.43) is at-goal on 950 units/hr.   Goal of Therapy:  Heparin level 0.3-0.7 units/ml Monitor platelets by anticoagulation protocol: Yes   Plan:  1. Continue IV heparin at 950 units/hr.  2. Heparin level in AM to confirm dosing.   Lorre Munroe, PharmD 03/27/2012,12:22 AM

## 2012-03-27 NOTE — CV Procedure (Signed)
   Cardiac Catheterization Procedure Note  Name: [name] MRN: [id number] DOB: 18-Aug-1935  Procedure:  Selective Coronary Angiography, SVG angiography  Indication: 77 year old white male with history of coronary disease status post CABG in 2012 as well as aortic valve replacement and mitral valve repair. He presents with symptoms concerning for crescendo angina.   Procedural details: The right groin was prepped, draped, and anesthetized with 1% lidocaine. Using modified Seldinger technique, a 5 French sheath was introduced into the right femoral artery. Standard Judkins catheters were used for coronary angiography and left ventriculography. Catheter exchanges were performed over a guidewire. There were no immediate procedural complications. The patient was transferred to the post catheterization recovery area for further monitoring.  Procedural Findings: Hemodynamics:  AO 113/58 with a mean of 81 mmHg LV N/A   Coronary angiography: Coronary dominance: right  Left mainstem: The left main coronary is moderately calcified with mild narrowing up to 20%.  Left anterior descending (LAD): The left anterior descending artery is a long tortuous vessel. It has diffuse disease in the proximal to mid vessel up to 20-30%. No obstructive disease noted.  Left circumflex (LCx): The left circumflex coronary has a 50% narrowing at origin. There is a stent present in the mid circumflex before the third marginal branch. The stent is patent. The first obtuse marginal vessel has a focal 70% stenosis in the proximal to mid vessel. The second marginal branch has diffuse 70% in the proximal to mid vessel. The third obtuse marginal vessel has competitive flow from the vein graft but appears to have a 60% stenosis proximally.  Right coronary artery (RCA):  the right coronary is a dominant vessel. It is very tortuous in the proximal and mid vessel. It is moderately calcified throughout. In the proximal to  mid vessel there is diffuse disease up to 50%. There is 40-50% disease distally. The PDA is a small caliber vessel with an 80% ostial stenosis. The posterior lateral branch fills by the vein graft.  The saphenous vein graft to the posterior lateral branch of the right coronary is widely patent.  The saphenous vein graft to the third obtuse marginal vessel is widely patent.  Left ventriculography:  not performed   Final Conclusions:   1. 2 vessel obstructive coronary disease. 2. Patent saphenous vein graft to the OM 3 3. Patent saphenous vein graft to the PL OM of the RCA.   Recommendations:  I would recommend continued medical therapy. Patient does have moderate disease in the first and second marginal branches and in the PDA.these areas do not appear to be critical and are unlikely to be the cause of his resting chest pain.   Theron Arista [facility] 03/27/2012, 3:11 PM

## 2012-03-28 ENCOUNTER — Telehealth: Payer: Self-pay | Admitting: Cardiovascular Disease

## 2012-03-28 MED ORDER — NITROGLYCERIN 0.4 MG SL SUBL: 0.4000 mg | SUBLINGUAL_TABLET | SUBLINGUAL | Status: AC | PRN

## 2012-03-28 NOTE — Telephone Encounter (Signed)
Fax Received. Refill Completed. [name] (R.M.A)   

## 2012-03-28 NOTE — Progress Notes (Signed)
Utilization Review Completed.   Cherika Jessie, RN, BSN Nurse Case Manager  [phone]  

## 2012-03-28 NOTE — Telephone Encounter (Signed)
Pt was to have nitro called in for refill at office visit, not there, can it please be called in asap to [facility] [address]??

## 2012-04-08 ENCOUNTER — Ambulatory Visit (INDEPENDENT_AMBULATORY_CARE_PROVIDER_SITE_OTHER): Payer: Medicare Other | Admitting: Nurse Practitioner

## 2012-04-08 ENCOUNTER — Encounter: Payer: Self-pay | Admitting: Nurse Practitioner

## 2012-04-08 VITALS — BP 142/80 | HR 76 | Ht 66.0 in | Wt 168.1 lb

## 2012-04-08 DIAGNOSIS — R079 Chest pain, unspecified: Secondary | ICD-10-CM | POA: Diagnosis not present

## 2012-04-08 MED ORDER — ISOSORBIDE MONONITRATE ER 60 MG PO TB24: 60.0000 mg | ORAL_TABLET | Freq: Every day | ORAL | Status: DC

## 2012-04-08 NOTE — Progress Notes (Signed)
[name] Date of Birth: 07-24-1935 Medical Record #[id number]  History of Present Illness: [name] is seen back today for a post hospital visit. He is seen for Dr. Elease Hashimoto. He has multiple issues with known CAD, prior CABG as well as valve replacement in 2012 at [facility]. Other issues are as outlined below.   Was seen in the office earlier this month and had chest pain. Subsequently admitted and cathed. Results are noted below. Medical therapy was recommended and Imdur was added to his regimen. Last echo was in November. Some progression of pulmonic insufficiency.   He comes in today. He is here alone. He says he is 75% improved but still having chest pain. Has 2 localized areas of pain over the left chest. Not worse with deep breathing or bending over. Climbing steps doesn't worsen this. Wants to know where it is coming from. Brings in a list that suggests other causes of pain. Not really interested in increasing his medicines (although it has helped). To start back walking later today since the weather is good today. Minimal soreness from his groin. Has been on chronic PPI therapy with good results. No GI symptoms reported. No cough. No hemoptysis. Not short of breath.   Current Outpatient Prescriptions on File Prior to Visit  Medication Sig Dispense Refill  . amLODipine (NORVASC) 5 MG tablet Take by mouth daily. Take 1/2 Tab Daily       . aspirin 325 MG tablet Take 325 mg by mouth daily.        [name] FLUoxetine (PROZAC) 20 MG capsule Take 20 mg by mouth daily.      . isosorbide mononitrate (IMDUR) 30 MG 24 hr tablet Take 1 tablet (30 mg total) by mouth daily.  30 tablet  3  . Melatonin 3 MG TABS Take 1 tablet by mouth daily.      . metFORMIN (GLUMETZA) 1000 MG (MOD) 24 hr tablet Take 1 tablet (1,000 mg total) by mouth 2 (two) times daily with a meal.      . metoprolol tartrate (LOPRESSOR) 25 MG tablet Take 25 mg by mouth 2 (two) times daily.      . Multiple Vitamin (MULTIVITAMIN PO)  Take by mouth.        . nitroGLYCERIN (NITROSTAT) 0.4 MG SL tablet Place 1 tablet (0.4 mg total) under the tongue every 5 (five) minutes as needed. For chest pain  25 tablet  5  . omeprazole (PRILOSEC) 20 MG capsule Take 20 mg by mouth daily.        . rosuvastatin (CRESTOR) 10 MG tablet Take 10 mg by mouth daily.        . vitamin C (ASCORBIC ACID) 500 MG tablet Take 500 mg by mouth 2 (two) times daily.          No Known Allergies  Past Medical History  Diagnosis Date  . Hyperlipidemia   . Mitral valve prolapse   . Aortic valve regurgitation   . Degenerative disk disease   . Major depression   . Spinal stenosis   . Coronary artery disease     post stents; prior CABG; s/p cath January 2014 with 2 VD, patent SVG to [facility] and patent SVG to PL patent  . Skin cancer of nose 2012    "right side" (03/26/2012)  . Skin cancer of trunk     "left chest" (03/26/2012)  . Hypertension   . Carotid artery occlusion   . Heart murmur   . Anginal pain   .  OSA (obstructive sleep apnea)     "have a mask; haven't used one in years" (03/26/2012)  . Type II diabetes mellitus   . History of esophageal stricture     "has it stretched q once in awhile" (03/26/2012"  . Kidney stones     Past Surgical History  Procedure Date  . Adenoidectomy 1956  . Lumbar disc surgery 1996  . Carotid endarterectomy 05/05/2003    right carotid endarterectomy  . Shoulder arthroscopy 01/07/2007    Left shoulder impingement, labral tear  . Cardiac catheterization 10/20/2003    Est. EF of 65% -- Critical disease in the mid to distal circumflex --  Preserved left ventricular  function --   . Coronary angioplasty with stent placement 10/20/2003    Percutaneous coronary intervention/drug-eluding stent implantation, third marginal branch --  Percutaneous closure, right femoral artery -- Atherosclerotic coronary vascular disease, single vessel / Status post successful percutaneous coronary intervention/tandem drug -- eluding stent  implantation proximal third marginal branch -- Typical angina was not reproduced with device insertion or balloon   . Hematoma evacuation 05/09/2003    Evacuation of hematoma, right neck  . Coronary artery bypass graft 08/29/2010    CABG X2; SVG to PDA, SVG to OM2- [facility]  . Aortic valve replacement 08/29/2010    25 mm CE Magna bioprosthetic - [facility]  . Mitral valve repair 08/23/2010    28 mm Simulus ring -[facility]  . Tonsillectomy and adenoidectomy 1956  . Appendectomy 1951  . Cardiac valve replacement   . Posterior fusion lumbar spine 1997  . Cervical disc surgery 1980's  . Anterior cervical decomp/discectomy fusion 2000's  . Skin cancer excision 1990's; 2012    "left chest & right nose" (03/26/2012)    History  Smoking status  . Former Smoker -- 5.0 packs/day for 23 years  . Types: Cigarettes  . Quit date: 10/02/1978  Smokeless tobacco  . Never Used    History  Alcohol Use No    Comment: 03/26/2012 "last alcohol was in 1970; never had problem w/it"    Family History  Problem Relation Age of Onset  . Diabetes Mother   . Coronary artery disease Mother   . Heart disease Mother   . Stroke Father   . Cancer Daughter     Review of Systems: The review of systems is per the HPI.  All other systems were reviewed and are negative.  Physical Exam: BP 142/80  Pulse 76  Ht 5\' 6"  (1.676 m)  Wt 168 lb 1.9 oz (76.259 kg)  BMI 27.14 kg/m2 Patient is alert and in no acute distress. Skin is warm and dry. Color is normal.  HEENT is unremarkable. Normocephalic/atraumatic. PERRL. Sclera are nonicteric. Neck is supple. No masses. No JVD. Lungs are clear. Cardiac exam shows a regular rate and rhythm. He has a diastolic murmur of AI and a systolic murmur at the apex noted. Abdomen is soft. Extremities are without edema. Gait and ROM are intact. No gross neurologic deficits noted.  LABORATORY DATA:  Echo Study Conclusions from November 2013  - Left ventricle: The cavity size was normal. Wall  thickness was normal. Systolic function was normal. The estimated ejection fraction was in the range of 55% to 65%. - Aortic valve: A bioprosthesis was present. - Mitral valve: Prior procedures included surgical repair. - Pulmonic valve: Moderate regurgitation.   Lab Results  Component Value Date   WBC 12.1* 03/27/2012   HGB 10.6* 03/27/2012   HCT 31.8* 03/27/2012  PLT 275 03/27/2012   GLUCOSE 123* 03/26/2012   CHOL 120 03/27/2012   TRIG 207* 03/27/2012   HDL 38* 03/27/2012   LDLCALC 41 03/27/2012   ALT 15 03/26/2012   AST 19 03/26/2012   NA 140 03/26/2012   K 4.2 03/26/2012   CL 102 03/26/2012   CREATININE 0.75 03/26/2012   BUN 12 03/26/2012   CO2 26 03/26/2012   TSH 2.847 03/26/2012   INR 1.16 03/26/2012    Coronary angiography:   Left mainstem: The left main coronary is moderately calcified with mild narrowing up to 20%.  Left anterior descending (LAD): The left anterior descending artery is a long tortuous vessel. It has diffuse disease in the proximal to mid vessel up to 20-30%. No obstructive disease noted.  Left circumflex (LCx): The left circumflex coronary has a 50% narrowing at origin. There is a stent present in the mid circumflex before the third marginal branch. The stent is patent. The first obtuse marginal vessel has a focal 70% stenosis in the proximal to mid vessel. The second marginal branch has diffuse 70% in the proximal to mid vessel. The third obtuse marginal vessel has competitive flow from the vein graft but appears to have a 60% stenosis proximally.  Right coronary artery (RCA): the right coronary is a dominant vessel. It is very tortuous in the proximal and mid vessel. It is moderately calcified throughout. In the proximal to mid vessel there is diffuse disease up to 50%. There is 40-50% disease distally. The PDA is a small caliber vessel with an 80% ostial stenosis. The posterior lateral branch fills by the vein graft.  The saphenous vein graft to the posterior lateral branch of the right  coronary is widely patent.  The saphenous vein graft to the third obtuse marginal vessel is widely patent.   Left ventriculography: not performed   Final Conclusions:  1. 2 vessel obstructive coronary disease.  2. Patent saphenous vein graft to the OM 3  3. Patent saphenous vein graft to the PL OM of the RCA.   Recommendations: I would recommend continued medical therapy. Patient does have moderate disease in the first and second marginal branches and in the PDA.these areas do not appear to be critical and are unlikely to be the cause of his resting chest pain.   Theron Arista [facility]  03/27/2012, 3:11 PM   Assessment / Plan: 1. Chest pain - has seen improvement but not total resolution of his chest pain. Wants definitive answers as to where it is coming from. Will arrange for CTA of the chest and increase the Imdur to 60 mg a day. May need to increase the Lopressor on return. Could consider Ranexa as well. See Dr. Elease Hashimoto back in one month.  2. CAD - managed medically  3. Valvular heart disease - last echo in November.  4. HTN - blood pressure ok and we have room to increase his medicines.  We will check CTA of the chest and increase the Imdur. See Dr. Elease Hashimoto back in 4 weeks.   Patient is agreeable to this plan and will call if any problems develop in the interim.

## 2012-04-08 NOTE — Patient Instructions (Addendum)
We are going to increase the Imdur to 60 mg a day. You may take 2 of your 30 mg tablets to equal this dose. The prescription for the 60 mg tablet is at the drug store.  We are going to arrange for a scan on your chest (CTA chest)  See Dr. Elease Hashimoto in one month  Call the [facility] office at [phone] if you have any questions, problems or concerns.

## 2012-04-09 ENCOUNTER — Ambulatory Visit (INDEPENDENT_AMBULATORY_CARE_PROVIDER_SITE_OTHER)
Admission: RE | Admit: 2012-04-09 | Discharge: 2012-04-09 | Disposition: A | Discharge status: Home / Self Care | Payer: Medicare Other | Source: Ambulatory Visit | Attending: Nurse Practitioner | Admitting: Nurse Practitioner

## 2012-04-09 DIAGNOSIS — R079 Chest pain, unspecified: Secondary | ICD-10-CM

## 2012-04-09 MED ORDER — IOHEXOL 350 MG/ML SOLN
80.0000 mL | Freq: Once | INTRAVENOUS | Status: AC | PRN
  Administered 2012-04-09: 80 mL via INTRAVENOUS

## 2012-04-12 ENCOUNTER — Telehealth: Payer: Self-pay | Admitting: Cardiovascular Disease

## 2012-04-12 NOTE — Telephone Encounter (Signed)
New problem:   Wants results today before the office close.    CT Scan.

## 2012-04-15 DIAGNOSIS — J069 Acute upper respiratory infection, unspecified: Secondary | ICD-10-CM | POA: Diagnosis not present

## 2012-05-09 ENCOUNTER — Emergency Department (HOSPITAL_COMMUNITY): Payer: Medicare Other

## 2012-05-09 ENCOUNTER — Encounter (HOSPITAL_COMMUNITY): Payer: Self-pay | Admitting: Emergency Medicine

## 2012-05-09 ENCOUNTER — Inpatient Hospital Stay (HOSPITAL_COMMUNITY)
Admission: EM | Admit: 2012-05-09 | Discharge: 2012-05-10 | DRG: 065 | Disposition: A | Discharge status: Home / Self Care | Payer: Medicare Other | Attending: Internal Medicine | Admitting: Internal Medicine

## 2012-05-09 ENCOUNTER — Inpatient Hospital Stay (HOSPITAL_COMMUNITY): Payer: Medicare Other

## 2012-05-09 DIAGNOSIS — I6529 Occlusion and stenosis of unspecified carotid artery: Secondary | ICD-10-CM | POA: Diagnosis not present

## 2012-05-09 DIAGNOSIS — I10 Essential (primary) hypertension: Secondary | ICD-10-CM | POA: Diagnosis present

## 2012-05-09 DIAGNOSIS — I369 Nonrheumatic tricuspid valve disorder, unspecified: Secondary | ICD-10-CM | POA: Diagnosis not present

## 2012-05-09 DIAGNOSIS — I639 Cerebral infarction, unspecified: Secondary | ICD-10-CM | POA: Diagnosis present

## 2012-05-09 DIAGNOSIS — R4701 Aphasia: Secondary | ICD-10-CM | POA: Diagnosis present

## 2012-05-09 DIAGNOSIS — Z9861 Coronary angioplasty status: Secondary | ICD-10-CM

## 2012-05-09 DIAGNOSIS — I2510 Atherosclerotic heart disease of native coronary artery without angina pectoris: Secondary | ICD-10-CM | POA: Diagnosis not present

## 2012-05-09 DIAGNOSIS — Z823 Family history of stroke: Secondary | ICD-10-CM | POA: Diagnosis not present

## 2012-05-09 DIAGNOSIS — Z833 Family history of diabetes mellitus: Secondary | ICD-10-CM

## 2012-05-09 DIAGNOSIS — Z951 Presence of aortocoronary bypass graft: Secondary | ICD-10-CM

## 2012-05-09 DIAGNOSIS — I6340 Cerebral infarction due to embolism of unspecified cerebral artery: Secondary | ICD-10-CM | POA: Diagnosis present

## 2012-05-09 DIAGNOSIS — F339 Major depressive disorder, recurrent, unspecified: Secondary | ICD-10-CM | POA: Diagnosis present

## 2012-05-09 DIAGNOSIS — Z8249 Family history of ischemic heart disease and other diseases of the circulatory system: Secondary | ICD-10-CM

## 2012-05-09 DIAGNOSIS — I6350 Cerebral infarction due to unspecified occlusion or stenosis of unspecified cerebral artery: Secondary | ICD-10-CM | POA: Diagnosis not present

## 2012-05-09 DIAGNOSIS — Z9889 Other specified postprocedural states: Secondary | ICD-10-CM

## 2012-05-09 DIAGNOSIS — Z981 Arthrodesis status: Secondary | ICD-10-CM

## 2012-05-09 DIAGNOSIS — I658 Occlusion and stenosis of other precerebral arteries: Secondary | ICD-10-CM | POA: Diagnosis present

## 2012-05-09 DIAGNOSIS — Z87891 Personal history of nicotine dependence: Secondary | ICD-10-CM | POA: Diagnosis not present

## 2012-05-09 DIAGNOSIS — Z79899 Other long term (current) drug therapy: Secondary | ICD-10-CM | POA: Diagnosis not present

## 2012-05-09 DIAGNOSIS — R4789 Other speech disturbances: Secondary | ICD-10-CM | POA: Diagnosis not present

## 2012-05-09 DIAGNOSIS — E785 Hyperlipidemia, unspecified: Secondary | ICD-10-CM | POA: Diagnosis present

## 2012-05-09 DIAGNOSIS — Z7982 Long term (current) use of aspirin: Secondary | ICD-10-CM | POA: Diagnosis not present

## 2012-05-09 DIAGNOSIS — E119 Type 2 diabetes mellitus without complications: Secondary | ICD-10-CM | POA: Diagnosis present

## 2012-05-09 DIAGNOSIS — Z952 Presence of prosthetic heart valve: Secondary | ICD-10-CM | POA: Diagnosis not present

## 2012-05-09 DIAGNOSIS — R079 Chest pain, unspecified: Secondary | ICD-10-CM | POA: Diagnosis not present

## 2012-05-09 DIAGNOSIS — G4733 Obstructive sleep apnea (adult) (pediatric): Secondary | ICD-10-CM | POA: Diagnosis present

## 2012-05-09 LAB — URINALYSIS, ROUTINE W REFLEX MICROSCOPIC
Ketones, ur: NEGATIVE mg/dL
Leukocytes, UA: NEGATIVE
Nitrite: NEGATIVE
Specific Gravity, Urine: 1.011 (ref 1.005–1.030)
pH: 6 (ref 5.0–8.0)

## 2012-05-09 LAB — COMPREHENSIVE METABOLIC PANEL
ALT: 14 U/L (ref 0–53)
Calcium: 8.9 mg/dL (ref 8.4–10.5)
GFR calc Af Amer: >90 mL/min (ref >90)
Glucose, Bld: 175 mg/dL — ABNORMAL HIGH (ref 70–99)
Sodium: 136 mEq/L (ref 135–145)
Total Protein: 7 g/dL (ref 6.0–8.3)

## 2012-05-09 LAB — CBC
Hemoglobin: 10.9 g/dL — ABNORMAL LOW (ref 13.0–17.0)
MCH: 29.4 pg (ref 26.0–34.0)
MCHC: 33.4 g/dL (ref 30.0–36.0)

## 2012-05-09 LAB — POCT I-STAT, CHEM 8
BUN: 16 mg/dL (ref 6–23)
Calcium, Ion: 1.15 mmol/L (ref 1.13–1.30)
Chloride: 109 mEq/L (ref 96–112)
Creatinine, Ser: 0.8 mg/dL (ref 0.50–1.35)
Glucose, Bld: 176 mg/dL — ABNORMAL HIGH (ref 70–99)
TCO2: 22 mmol/L (ref 0–100)

## 2012-05-09 LAB — PROTIME-INR: Prothrombin Time: 13.6 seconds (ref 11.6–15.2)

## 2012-05-09 LAB — RAPID URINE DRUG SCREEN, HOSP PERFORMED
Benzodiazepines: NOT DETECTED
Opiates: NOT DETECTED

## 2012-05-09 LAB — BILIRUBIN, DIRECT: Bilirubin, Direct: 0.1 mg/dL (ref 0.0–0.3)

## 2012-05-09 LAB — GLUCOSE, CAPILLARY
Glucose-Capillary: 154 mg/dL — ABNORMAL HIGH (ref 70–99)
Glucose-Capillary: 234 mg/dL — ABNORMAL HIGH (ref 70–99)

## 2012-05-09 LAB — TROPONIN I: Troponin I: <0.3 ng/mL (ref <0.30)

## 2012-05-09 MED ORDER — METOPROLOL TARTRATE 1 MG/ML IV SOLN
5.0000 mg | INTRAVENOUS | Status: DC | PRN
  Filled 2012-05-09: qty 5

## 2012-05-09 MED ORDER — HEPARIN SODIUM (PORCINE) 5000 UNIT/ML IJ SOLN
5000.0000 [IU] | Freq: Three times a day (TID) | INTRAMUSCULAR | Status: DC
  Administered 2012-05-09 – 2012-05-10 (×2): 5000 [IU] via SUBCUTANEOUS
  Filled 2012-05-09 (×5): qty 1

## 2012-05-09 MED ORDER — ONDANSETRON HCL 4 MG/2ML IJ SOLN: 4.0000 mg | Freq: Four times a day (QID) | INTRAMUSCULAR | Status: DC | PRN

## 2012-05-09 MED ORDER — ISOSORBIDE MONONITRATE ER 60 MG PO TB24
120.0000 mg | ORAL_TABLET | Freq: Every day | ORAL | Status: DC
Start: 2012-05-09 — End: 2012-05-10
  Administered 2012-05-09 – 2012-05-10 (×2): 120 mg via ORAL
  Filled 2012-05-09 (×2): qty 2

## 2012-05-09 MED ORDER — VITAMIN C 500 MG PO TABS
500.0000 mg | ORAL_TABLET | Freq: Two times a day (BID) | ORAL | Status: DC
  Administered 2012-05-09 – 2012-05-10 (×2): 500 mg via ORAL
  Filled 2012-05-09 (×3): qty 1

## 2012-05-09 MED ORDER — PANTOPRAZOLE SODIUM 40 MG PO TBEC
40.0000 mg | DELAYED_RELEASE_TABLET | Freq: Every day | ORAL | Status: DC
  Administered 2012-05-10: 40 mg via ORAL
  Filled 2012-05-09 (×2): qty 1

## 2012-05-09 MED ORDER — CLOPIDOGREL BISULFATE 75 MG PO TABS
75.0000 mg | ORAL_TABLET | Freq: Every day | ORAL | Status: DC
  Administered 2012-05-10: 75 mg via ORAL
  Filled 2012-05-09 (×2): qty 1

## 2012-05-09 MED ORDER — LORAZEPAM 2 MG/ML IJ SOLN
INTRAMUSCULAR | Status: AC
  Filled 2012-05-09: qty 1

## 2012-05-09 MED ORDER — SODIUM CHLORIDE 0.9 % IJ SOLN
3.0000 mL | Freq: Two times a day (BID) | INTRAMUSCULAR | Status: DC
  Administered 2012-05-09 (×2): 3 mL via INTRAVENOUS

## 2012-05-09 MED ORDER — NITROGLYCERIN 0.4 MG SL SUBL: 0.4000 mg | SUBLINGUAL_TABLET | SUBLINGUAL | Status: DC | PRN

## 2012-05-09 MED ORDER — HYDROCODONE-ACETAMINOPHEN 5-325 MG PO TABS: 1.0000 | ORAL_TABLET | ORAL | Status: DC | PRN

## 2012-05-09 MED ORDER — INSULIN ASPART 100 UNIT/ML ~~LOC~~ SOLN
0.0000 [IU] | Freq: Three times a day (TID) | SUBCUTANEOUS | Status: DC
  Administered 2012-05-09: 3 [IU] via SUBCUTANEOUS
  Administered 2012-05-10: 1 [IU] via SUBCUTANEOUS
  Administered 2012-05-10 (×2): 2 [IU] via SUBCUTANEOUS

## 2012-05-09 MED ORDER — ZOLPIDEM TARTRATE 5 MG PO TABS
2.5000 mg | ORAL_TABLET | Freq: Every evening | ORAL | Status: DC | PRN
  Administered 2012-05-09: 2.5 mg via ORAL
  Filled 2012-05-09: qty 1

## 2012-05-09 MED ORDER — ISOSORBIDE MONONITRATE ER 60 MG PO TB24: 60.0000 mg | ORAL_TABLET | Freq: Every day | ORAL | Status: DC

## 2012-05-09 MED ORDER — POLYETHYLENE GLYCOL 3350 17 G PO PACK
17.0000 g | PACK | Freq: Every day | ORAL | Status: DC | PRN
  Administered 2012-05-09: 17 g via ORAL
  Filled 2012-05-09: qty 1

## 2012-05-09 MED ORDER — ALBUTEROL SULFATE (5 MG/ML) 0.5% IN NEBU: 2.5000 mg | INHALATION_SOLUTION | RESPIRATORY_TRACT | Status: DC | PRN

## 2012-05-09 MED ORDER — ONDANSETRON HCL 4 MG PO TABS: 4.0000 mg | ORAL_TABLET | Freq: Four times a day (QID) | ORAL | Status: DC | PRN

## 2012-05-09 MED ORDER — ASPIRIN 325 MG PO TABS
325.0000 mg | ORAL_TABLET | Freq: Once | ORAL | Status: AC
  Administered 2012-05-09: 325 mg via ORAL
  Filled 2012-05-09: qty 1

## 2012-05-09 MED ORDER — GUAIFENESIN-DM 100-10 MG/5ML PO SYRP
5.0000 mL | ORAL_SOLUTION | ORAL | Status: DC | PRN
  Administered 2012-05-09: 5 mL via ORAL
  Filled 2012-05-09 (×2): qty 5

## 2012-05-09 MED ORDER — ATORVASTATIN CALCIUM 10 MG PO TABS
10.0000 mg | ORAL_TABLET | Freq: Every day | ORAL | Status: DC
  Administered 2012-05-09: 10 mg via ORAL
  Filled 2012-05-09 (×2): qty 1

## 2012-05-09 MED ORDER — FLUOXETINE HCL 20 MG PO CAPS
20.0000 mg | ORAL_CAPSULE | Freq: Every day | ORAL | Status: DC
  Administered 2012-05-10: 20 mg via ORAL
  Filled 2012-05-09: qty 1

## 2012-05-09 NOTE — ED Notes (Signed)
Patient transported to MRI 

## 2012-05-09 NOTE — Progress Notes (Signed)
Utilization Review Completed.   River Mckercher, RN, BSN Nurse Case Manager  [phone]  

## 2012-05-09 NOTE — H&P (Signed)
[facility]                                                                                    Patient Demographics  [name], is a 77 y.o. male  CSN: [id number]  MRN: [id number]  DOB - [birth year]  Admit Date - 05/09/2012  Outpatient Primary MD for the patient is Lillia Mountain, MD   With History of -  Past Medical History  Diagnosis Date  . Hyperlipidemia   . Mitral valve prolapse   . Aortic valve regurgitation   . Degenerative disk disease   . Major depression   . Spinal stenosis   . Coronary artery disease     post stents; prior CABG; s/p cath January 2014 with 2 VD, patent SVG to [facility] and patent SVG to PL patent  . Skin cancer of nose 2012    "right side" (03/26/2012)  . Skin cancer of trunk     "left chest" (03/26/2012)  . Hypertension   . Carotid artery occlusion   . Heart murmur   . Anginal pain   . OSA (obstructive sleep apnea)     "have a mask; haven't used one in years" (03/26/2012)  . Type II diabetes mellitus   . History of esophageal stricture     "has it stretched q once in awhile" (03/26/2012"  . Kidney stones       Past Surgical History  Procedure Laterality Date  . Adenoidectomy  1956  . Lumbar disc surgery  1996  . Carotid endarterectomy  05/05/2003    right carotid endarterectomy  . Shoulder arthroscopy  01/07/2007    Left shoulder impingement, labral tear  . Cardiac catheterization  10/20/2003    Est. EF of 65% -- Critical disease in the mid to distal circumflex --  Preserved left ventricular  function --   . Coronary angioplasty with stent placement  10/20/2003    Percutaneous coronary intervention/drug-eluding stent implantation, third marginal branch --  Percutaneous closure, right femoral artery -- Atherosclerotic coronary vascular disease, single vessel / Status post successful percutaneous coronary intervention/tandem drug -- eluding stent implantation proximal third marginal branch -- Typical angina was not reproduced  with device insertion or balloon   . Hematoma evacuation  05/09/2003    Evacuation of hematoma, right neck  . Coronary artery bypass graft  08/29/2010    CABG X2; SVG to PDA, SVG to OM2- [facility]  . Aortic valve replacement  08/29/2010    25 mm CE Magna bioprosthetic - [facility]  . Mitral valve repair  08/23/2010    28 mm Simulus ring -[facility]  . Tonsillectomy and adenoidectomy  1956  . Appendectomy  1951  . Cardiac valve replacement    . Posterior fusion lumbar spine  1997  . Cervical disc surgery  1980's  . Anterior cervical decomp/discectomy fusion  2000's  . Skin cancer excision  1990's; 2012    "left chest & right nose" (03/26/2012)    in for   Chief Complaint  Patient presents with  . Chest Pain  . Aphasia     HPI  [name]  is a 77 y.o. male, with history of CAD  status post CABG and stents follows with Dr. Melburn Popper, carotid artery disease status post carotid endarterectomy around 10 years ago on the right side, mitral valve repair and aortic valve replacement which is a porcine valve, who's been having intermittent substernal/left sided chest pains for the last few weeks and is being followed by his cardiologist Dr. Melburn Popper for the same, per patient and wife he slept well last night woke up this morning and wife around 6:30 AM noticed that his speech was slightly off i.e. slurred, he was then brought to the hospital around 8 AM, he underwent an MRI of the brain which is suggestive ofAcute infarct right posterior frontal cortex over the convexity, his symptoms were rapidly resolving, I was requested to admit the patient at 10:00 after neurology at [facility] was consulted over the phone.   Patient's symptoms now have completely resolved, he does not have any headache, no problems with vision hearing, he never had any focal weakness, minimal facial droop, he has apparently passed his swallowing test in the ER.     Review of Systems    besides almost resolved slurred speech this  morning, intermittent chronic chest pains last evening yesterday evening essentially unremarkable review of systems.  In addition to the HPI above,   No Fever-chills, No Headache, No changes with Vision or hearing, No problems swallowing food or Liquids, No Chest pain, Cough or Shortness of Breath, No Abdominal pain, No Nausea or Vommitting, Bowel movements are regular, No Blood in stool or Urine, No dysuria, No new skin rashes or bruises, No new joints pains-aches,  No new weakness, tingling, numbness in any extremity, No recent weight gain or loss, No polyuria, polydypsia or polyphagia, No significant Mental Stressors.  A full 10 point Review of Systems was done, except as stated above, all other Review of Systems were negative.   Social History History  Substance Use Topics  . Smoking status: Former Smoker -- 5.00 packs/day for 23 years    Types: Cigarettes    Quit date: 10/02/1978  . Smokeless tobacco: Never Used  . Alcohol Use: No     Comment: 03/26/2012 "last alcohol was in 1970; never had problem w/it"      Family History Family History  Problem Relation Age of Onset  . Diabetes Mother   . Coronary artery disease Mother   . Heart disease Mother   . Stroke Father   . Cancer Daughter       Prior to Admission medications   Medication Sig Start Date End Date Taking? Authorizing Provider  amLODipine (NORVASC) 5 MG tablet Take by mouth daily. Take 1/2 Tab Daily    Yes Historical Provider, MD  aspirin 325 MG tablet Take 325 mg by mouth daily.     Yes Historical Provider, MD  FLUoxetine (PROZAC) 20 MG capsule Take 20 mg by mouth daily.   Yes Historical Provider, MD  isosorbide mononitrate (IMDUR) 60 MG 24 hr tablet Take 120 mg by mouth daily. 04/08/12  Yes Rosalio Macadamia, NP  Melatonin 3 MG TABS Take 1 tablet by mouth daily.   Yes Historical Provider, MD  metFORMIN (GLUMETZA) 1000 MG (MOD) 24 hr tablet Take 1 tablet (1,000 mg total) by mouth 2 (two) times daily with a  meal. 03/29/12  Yes Roger A Arguello, PA-C  metoprolol tartrate (LOPRESSOR) 25 MG tablet Take 25 mg by mouth 2 (two) times daily.   Yes Historical Provider, MD  Multiple Vitamin (MULTIVITAMIN PO) Take by mouth.  Yes Historical Provider, MD  nitroGLYCERIN (NITROSTAT) 0.4 MG SL tablet Place 1 tablet (0.4 mg total) under the tongue every 5 (five) minutes as needed. For chest pain 03/28/12  Yes Vesta Mixer, MD  omeprazole (PRILOSEC) 20 MG capsule Take 20 mg by mouth daily.     Yes Historical Provider, MD  Pseudoeph-Doxylamine-DM-APAP (NYQUIL) 60-7.08-16-998 MG/30ML LIQD Take by mouth.   Yes Historical Provider, MD  rosuvastatin (CRESTOR) 10 MG tablet Take 10 mg by mouth daily.     Yes Historical Provider, MD  vitamin C (ASCORBIC ACID) 500 MG tablet Take 500 mg by mouth 2 (two) times daily.     Yes Historical Provider, MD    No Known Allergies  Physical Exam  Vitals  Blood pressure 151/63, pulse 72, temperature 97.7 F (36.5 C), temperature source Oral, resp. rate 17, SpO2 100.00%.   1. General middle-aged white male lying in bed in NAD,    2. Normal affect and insight, Not Suicidal or Homicidal, Awake Alert, Oriented X 3.  3. No F.N deficits, ALL C.Nerves Intact, Strength 5/5 all 4 extremities, Sensation intact all 4 extremities, Plantars down going. Mild facial asymmetry, speech close to normal now.  4. Ears and Eyes appear Normal, Conjunctivae clear, PERRLA. Moist Oral Mucosa.  5. Supple Neck, No JVD, No cervical lymphadenopathy appriciated, No Carotid Bruits.  6. Symmetrical Chest wall movement, Good air movement bilaterally, CTAB.  7. RRR, No Gallops, Rubs or Murmurs, No Parasternal Heave.  8. Positive Bowel Sounds, Abdomen Soft, Non tender, No organomegaly appriciated,No rebound -guarding or rigidity.  9.  No Cyanosis, Normal Skin Turgor, No Skin Rash or Bruise.  10. Good muscle tone,  joints appear normal , no effusions, Normal ROM.  11. No Palpable Lymph Nodes in Neck  or Axillae     Data Review  CBC  Recent Labs Lab 05/09/12 0720 05/09/12 0743  WBC 10.4  --   HGB 10.9* 11.6*  HCT 32.6* 34.0*  PLT 278  --   MCV 87.9  --   MCH 29.4  --   MCHC 33.4  --   RDW 13.4  --    ------------------------------------------------------------------------------------------------------------------  Chemistries   Recent Labs Lab 05/09/12 0720 05/09/12 0743  NA 136 140  K 4.3 4.3  CL 101 109  CO2 23  --   GLUCOSE 175* 176*  BUN 16 16  CREATININE 0.70 0.80  CALCIUM 8.9  --   AST 17  --   ALT 14  --   ALKPHOS 65  --   BILITOT 0.5  --    ------------------------------------------------------------------------------------------------------------------ CrCl is unknown because both a height and weight (above a minimum accepted value) are required for this calculation. ------------------------------------------------------------------------------------------------------------------ No results found for this basename: TSH, T4TOTAL, FREET3, T3FREE, THYROIDAB,  in the last 72 hours   Coagulation profile No results found for this basename: INR, PROTIME,  in the last 168 hours ------------------------------------------------------------------------------------------------------------------- No results found for this basename: DDIMER,  in the last 72 hours -------------------------------------------------------------------------------------------------------------------  Cardiac Enzymes No results found for this basename: CK, CKMB, TROPONINI, MYOGLOBIN,  in the last 168 hours ------------------------------------------------------------------------------------------------------------------ No components found with this basename: POCBNP,    ---------------------------------------------------------------------------------------------------------------  Urinalysis    Component Value Date/Time   COLORURINE YELLOW 05/09/2012 0902   APPEARANCEUR CLEAR  05/09/2012 0902   LABSPEC 1.011 05/09/2012 0902   PHURINE 6.0 05/09/2012 0902   GLUCOSEU NEGATIVE 05/09/2012 0902   HGBUR NEGATIVE 05/09/2012 0902   BILIRUBINUR NEGATIVE 05/09/2012 0902   KETONESUR NEGATIVE 05/09/2012  0902   PROTEINUR NEGATIVE 05/09/2012 0902   UROBILINOGEN 0.2 05/09/2012 0902   NITRITE NEGATIVE 05/09/2012 0902   LEUKOCYTESUR NEGATIVE 05/09/2012 0902    ----------------------------------------------------------------------------------------------------------------  Imaging results:     Mr Brain Wo Contrast  05/09/2012  *RADIOLOGY REPORT*  Clinical Data: Slurred speech.  Stroke  MRI HEAD WITHOUT CONTRAST  Technique:  Multiplanar, multiecho pulse sequences of the brain and surrounding structures were obtained according to standard protocol without intravenous contrast.  Comparison: MRI 10/26/2011  Findings: Acute infarct right posterior frontal cortex over the convexity.  This is in the precentral gyrus.  Scattered hyperintensities in the deep white matter bilaterally are similar to the prior MRI and consistent with chronic microvascular ischemia.  Multiple areas of chronic micro hemorrhage in the brain, similar to the prior study.  Question chronic hypertension.  Trauma and cerebral amyloid are  other possible explanations for the chronic micro hemorrhage.  Ventricle size is normal.  Negative for mass or edema.  No midline shift.  Mucosal edema in the paranasal sinuses.  Small retention cyst right maxillary sinus.  IMPRESSION: Acute infarct right posterior frontal cortex over the convexity.  Mild chronic microvascular ischemic changes in the white matter. Chronic micro hemorrhage throughout the brain, unchanged from prior study.   Original Report Authenticated By: Janeece Riggers, M.D.    Dg Chest Port 1 View  05/09/2012  *RADIOLOGY REPORT*  Clinical Data: Slurred speech and chest pain.  PORTABLE CHEST - 1 VIEW  Comparison: 03/27/2012  Findings: The lungs are clear.  No edema, infiltrate or  pleural fluid is identified.  Heart size is within normal limits and stable.  Changes are again seen related to prior CABG and heart valve replacement.  IMPRESSION: No active disease.   Original Report Authenticated By: Irish Lack, M.D.     My personal review of EKG: Rhythm NSR, Rate  60s, no Acute ST changes    Assessment & Plan  1. Acute infarct right posterior frontal cortex over the convexity- causing rapidly improving slurred speech and mild facial droop, patient did not receive TPA do to unclear time of onset and rapidly improving symptoms. Of note he has had MRI of the brain, neurology has been consulted by the ER physician over the phone, I was requested to admit the patient and then transfer him to neuro floor at [facility], patient will be admitted per CVA protocol.  I have ordered echo gram along with carotid duplex, consult PT, OT, speech, order lipid panel and A1c, continue home dose statin, he has received aspirin 325 today, I have ordered Plavix from tomorrow, will defer any changes in the anticoagulation regimen to the neurologist.  Will request neurology to please address intracerebral vascular imaging, and to check antiplatelet medication orders.    2. History of CAD ongoing intermittent chest pains for the last 2 weeks.- He has had a recent CT angiogram, EKG was unremarkable, he is pain-free, first set of troponin is negative, will cycle troponins further, telemetry monitoring, home dose beta blocker and Norvasc will be held to allow for permissive hypertension, when necessary IV Lopressor will be ordered with written parameters. Of note patient follows with Dr. Melburn Popper and if any acute cardiac issues arise he should be consulted.   3. Diabetes mellitus type 2. Carb modify diet once cleared by speech, A1c, sliding scale insulin, hold Glucophage.   4. Dyslipidemia. Continue home dose statin, check lipid panel.   5. History of carotid artery disease. Status post  right-sided carotid  endarterectomy around 10 years ago, check carotid duplex.    Message has been left on Dr. Amedeo Kinsman office machine, patient needs to be picked up by Dr. Kirby Funk on 05/10/2012.    DVT Prophylaxis Heparin   AM Labs Ordered, also please review Full Orders  Family Communication: Admission, patients condition and plan of care including tests being ordered have been discussed with the patient and wife who indicate understanding and agree with the plan and Code Status.  Code Status Full  Likely DC to  Home  Time spent in minutes : 35  Condition Marinell Blight K M.D on 05/09/2012 at 10:18 AM  Between 7am to 7pm - Pager - [phone]  After 7pm go to [email or web address] - password [email or web address]  And look for the night coverage person covering me after hours  [facility] Office  [phone]

## 2012-05-09 NOTE — Progress Notes (Signed)
Received call from RN. Tele =NSR c some PVC's and artifact-unabvle to see any afib Asked tele tech to check strips again and inform if recurs  Pleas Koch, MD [facility] [phone]

## 2012-05-09 NOTE — ED Notes (Signed)
X-ray at bedside

## 2012-05-09 NOTE — Progress Notes (Addendum)
Received call from CMT that pt was in NSR now he is A. Fib on tele, HR 87, asymptomatic. Dr. Mahala Menghini notified. No new orders received. Halona Amstutz, [place] Marie , RN    Pt rhythm back in NSR at 5:30pm, HR in 80s, no c/o pain. Will continue to monitor.

## 2012-05-09 NOTE — Evaluation (Signed)
Speech Language Pathology Evaluation Patient Details Name: [name] MRN: [id number] DOB: 05-22-1935 Today's Date: 05/09/2012 Time: 1420-     Problem List:  Patient Active Problem List  Diagnosis  . CAD (coronary artery disease)  . S/P AVR (aortic valve replacement)  . S/P MVR (mitral valve repair)  . Occlusion and stenosis of carotid artery without mention of cerebral infarction  . Syncope  . Chest pain, moderate coronary artery risk  . CVA (cerebral infarction)   Past Medical History:  Past Medical History  Diagnosis Date  . Hyperlipidemia   . Mitral valve prolapse   . Aortic valve regurgitation   . Degenerative disk disease   . Major depression   . Spinal stenosis   . Coronary artery disease     post stents; prior CABG; s/p cath January 2014 with 2 VD, patent SVG to [facility] and patent SVG to PL patent  . Skin cancer of nose 2012    "right side" (03/26/2012)  . Skin cancer of trunk     "left chest" (03/26/2012)  . Hypertension   . Carotid artery occlusion   . Heart murmur   . Anginal pain   . OSA (obstructive sleep apnea)     "have a mask; haven't used one in years" (03/26/2012)  . Type II diabetes mellitus   . History of esophageal stricture     "has it stretched q once in awhile" (03/26/2012"  . Kidney stones    Past Surgical History:  Past Surgical History  Procedure Laterality Date  . Adenoidectomy  1956  . Lumbar disc surgery  1996  . Carotid endarterectomy  05/05/2003    right carotid endarterectomy  . Shoulder arthroscopy  01/07/2007    Left shoulder impingement, labral tear  . Cardiac catheterization  10/20/2003    Est. EF of 65% -- Critical disease in the mid to distal circumflex --  Preserved left ventricular  function --   . Coronary angioplasty with stent placement  10/20/2003    Percutaneous coronary intervention/drug-eluding stent implantation, third marginal branch --  Percutaneous closure, right femoral artery -- Atherosclerotic coronary  vascular disease, single vessel / Status post successful percutaneous coronary intervention/tandem drug -- eluding stent implantation proximal third marginal branch -- Typical angina was not reproduced with device insertion or balloon   . Hematoma evacuation  05/09/2003    Evacuation of hematoma, right neck  . Coronary artery bypass graft  08/29/2010    CABG X2; SVG to PDA, SVG to OM2- [facility]  . Aortic valve replacement  08/29/2010    25 mm CE Magna bioprosthetic - [facility]  . Mitral valve repair  08/23/2010    28 mm Simulus ring -[facility]  . Tonsillectomy and adenoidectomy  1956  . Appendectomy  1951  . Cardiac valve replacement    . Posterior fusion lumbar spine  1997  . Cervical disc surgery  1980's  . Anterior cervical decomp/discectomy fusion  2000's  . Skin cancer excision  1990's; 2012    "left chest & right nose" (03/26/2012)   HPI:  [name]  is a 77 y.o. male, with history of CAD status post CABG and stents follows with Dr. Melburn Popper, carotid artery disease status post carotid endarterectomy around 10 years ago on the right side, mitral valve repair and aortic valve replacement which is a porcine valve, who's been having intermittent substernal/left sided chest pains for the last few weeks and is being followed by his cardiologist Dr. Melburn Popper for the same, per patient  and wife he slept well last night woke up this morning and wife around 6:30 AM noticed that his speech was slightly off i.e. slurred, he was then brought to the hospital around 8 AM, he underwent an MRI of the brain which is suggestive ofAcute infarct right posterior frontal cortex over the convexity, his symptoms were rapidly resolving, I was requested to admit the patient at 10:00 after neurology at [facility] was consulted over the phone.   Assessment / Plan / Recommendation Clinical Impression  Patient presents with a mild dysarthria, characterized by imprecise articulation of consonants, and reduced breath support for  conversational speech.  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  A home program of oral-motor exercies and compensatory strategies was provided and reviewed.  Possible short-term memory and mild pragmatic changes were noted.  Further assessment is warranted.    SLP Assessment  Patient needs continued Speech Lanaguage Pathology Services    Follow Up Recommendations  None;24 hour supervision/assistance    Frequency and Duration min 1 x/week  2 weeks   Pertinent Vitals/Pain N/A   SLP Goals  SLP Goals Potential to Achieve Goals: Good Progress/Goals/Alternative treatment plan discussed with pt/caregiver and they: Agree SLP Goal #1: Patient will demonstrate compensatory strategies with minimal verbal cues to improve the quality and intelligibility of speech in conversation. SLP Goal #2: Patient will complete oral-motor exercises independently. SLP Goal #3: Complete further cognitive assessment.  SLP Evaluation Prior Functioning  Cognitive/Linguistic Baseline: Information not available Type of Home: House Lives With: Spouse Available Help at Discharge: Family Education: Law school graduate Vocation: Part time employment (Nurse, adult)   Cognition  Overall Cognitive Status: Appears within functional limits for tasks assessed Arousal/Alertness: Awake/alert Orientation Level: Oriented X4 Attention: Divided Divided Attention: Appears intact Memory: Impaired Memory Impairment: Decreased short term memory Awareness: Appears intact Problem Solving: Appears intact Safety/Judgment: Appears intact    Comprehension  Auditory Comprehension Overall Auditory Comprehension: Appears within functional limits for tasks assessed Yes/No Questions: Within Functional Limits Commands: Within Functional Limits Conversation: Complex Visual  Recognition/Discrimination Discrimination: Not tested Reading Comprehension Reading Status: Not tested    Expression Expression Primary Mode of Expression: Verbal Verbal Expression Overall Verbal Expression: Appears within functional limits for tasks assessed Initiation: No impairment Level of Generative/Spontaneous Verbalization: Conversation Repetition: No impairment Naming: No impairment Pragmatics: Impairment Impairments: Monotone Non-Verbal Means of Communication: Not applicable Written Expression Written Expression: Not tested   Oral / Motor Oral Motor/Sensory Function Overall Oral Motor/Sensory Function: Impaired Labial ROM: Reduced left Labial Symmetry: Abnormal symmetry left Labial Strength: Reduced Lingual ROM: Within Functional Limits Lingual Symmetry: Within Functional Limits Lingual Strength: Within Functional Limits Motor Speech Overall Motor Speech: Impaired Respiration: Impaired Level of Impairment: Conversation Phonation: Low vocal intensity Resonance: Within functional limits Articulation: Impaired Level of Impairment: Sentence Intelligibility: Intelligible Conversation: 75-100% accurate Motor Planning: Witnin functional limits   GO     Maryjo Rochester T 05/09/2012, 3:23 PM

## 2012-05-09 NOTE — Consult Note (Signed)
Chief Complaint: stroke  HPI:                                                                                                                                         [name] is an 77 y.o. male with multiple stroke risk factors.  Patient was his normal baseline last night and awoke this AM with slurred speech and left NL decrease noted by wife.  Patient was brought to [facility] where CT head was normal but MRI head showed a acute infarct in right posterior frontal cortex.  Patient was transferred to [facility].  Currently patient shows dysarthria when he speaks quickly and mild left UE weakness.    LSN: 05/08/12, 11 pm tPA Given: No: out of window--no exact LSN  Past Medical History  Diagnosis Date  . Hyperlipidemia   . Mitral valve prolapse   . Aortic valve regurgitation   . Degenerative disk disease   . Major depression   . Spinal stenosis   . Coronary artery disease     post stents; prior CABG; s/p cath January 2014 with 2 VD, patent SVG to [facility] and patent SVG to PL patent  . Skin cancer of nose 2012    "right side" (03/26/2012)  . Skin cancer of trunk     "left chest" (03/26/2012)  . Hypertension   . Carotid artery occlusion   . Heart murmur   . Anginal pain   . OSA (obstructive sleep apnea)     "have a mask; haven't used one in years" (03/26/2012)  . Type II diabetes mellitus   . History of esophageal stricture     "has it stretched q once in awhile" (03/26/2012"  . Kidney stones     Past Surgical History  Procedure Laterality Date  . Adenoidectomy  1956  . Lumbar disc surgery  1996  . Carotid endarterectomy  05/05/2003    right carotid endarterectomy  . Shoulder arthroscopy  01/07/2007    Left shoulder impingement, labral tear  . Cardiac catheterization  10/20/2003    Est. EF of 65% -- Critical disease in the mid to distal circumflex --  Preserved left ventricular  function --   . Coronary angioplasty with stent placement  10/20/2003    Percutaneous  coronary intervention/drug-eluding stent implantation, third marginal branch --  Percutaneous closure, right femoral artery -- Atherosclerotic coronary vascular disease, single vessel / Status post successful percutaneous coronary intervention/tandem drug -- eluding stent implantation proximal third marginal branch -- Typical angina was not reproduced with device insertion or balloon   . Hematoma evacuation  05/09/2003    Evacuation of hematoma, right neck  . Coronary artery bypass graft  08/29/2010    CABG X2; SVG to PDA, SVG to OM2- [facility]  . Aortic valve replacement  08/29/2010    25 mm CE Magna bioprosthetic - [facility]  . Mitral  valve repair  08/23/2010    28 mm Simulus ring -[facility]  . Tonsillectomy and adenoidectomy  1956  . Appendectomy  1951  . Cardiac valve replacement    . Posterior fusion lumbar spine  1997  . Cervical disc surgery  1980's  . Anterior cervical decomp/discectomy fusion  2000's  . Skin cancer excision  1990's; 2012    "left chest & right nose" (03/26/2012)    Family History  Problem Relation Age of Onset  . Diabetes Mother   . Coronary artery disease Mother   . Heart disease Mother   . Stroke Father   . Cancer Daughter    Social History:  reports that he quit smoking about 33 years ago. His smoking use included Cigarettes. He has a 115 pack-year smoking history. He has never used smokeless tobacco. He reports that he does not drink alcohol or use illicit drugs.  Allergies: No Known Allergies  Medications:                                                                                                                           Prior to Admission:  Prescriptions prior to admission  Medication Sig Dispense Refill  . amLODipine (NORVASC) 5 MG tablet Take by mouth daily. Take 1/2 Tab Daily       . aspirin 325 MG tablet Take 325 mg by mouth daily.        [name] FLUoxetine (PROZAC) 20 MG capsule Take 20 mg by mouth daily.      . isosorbide mononitrate (IMDUR) 60 MG 24 hr  tablet Take 120 mg by mouth daily.      . Melatonin 3 MG TABS Take 1 tablet by mouth daily.      . metFORMIN (GLUMETZA) 1000 MG (MOD) 24 hr tablet Take 1 tablet (1,000 mg total) by mouth 2 (two) times daily with a meal.      . metoprolol tartrate (LOPRESSOR) 25 MG tablet Take 25 mg by mouth 2 (two) times daily.      . Multiple Vitamin (MULTIVITAMIN PO) Take by mouth.        . nitroGLYCERIN (NITROSTAT) 0.4 MG SL tablet Place 1 tablet (0.4 mg total) under the tongue every 5 (five) minutes as needed. For chest pain  25 tablet  5  . omeprazole (PRILOSEC) 20 MG capsule Take 20 mg by mouth daily.        . Pseudoeph-Doxylamine-DM-APAP (NYQUIL) 60-7.08-16-998 MG/30ML LIQD Take by mouth.      . rosuvastatin (CRESTOR) 10 MG tablet Take 10 mg by mouth daily.        . vitamin C (ASCORBIC ACID) 500 MG tablet Take 500 mg by mouth 2 (two) times daily.         Scheduled: . atorvastatin  10 mg Oral q1800  . [START ON 05/10/2012] clopidogrel  75 mg Oral Q breakfast  . [START ON 05/10/2012] FLUoxetine  20 mg Oral Daily  . heparin  5,000 Units Subcutaneous  Q8H  . insulin aspart  0-9 Units Subcutaneous TID WC  . isosorbide mononitrate  120 mg Oral Daily  . LORazepam      . pantoprazole  40 mg Oral Daily  . sodium chloride  3 mL Intravenous Q12H  . vitamin C  500 mg Oral BID    ROS:                                                                                                                                       History obtained from the patient  General ROS: negative for - chills, fatigue, fever, night sweats, weight gain or weight loss Psychological ROS: negative for - behavioral disorder, hallucinations, memory difficulties, mood swings or suicidal ideation Ophthalmic ROS: negative for - blurry vision, double vision, eye pain or loss of vision ENT ROS: negative for - epistaxis, nasal discharge, oral lesions, sore throat, tinnitus or vertigo Allergy and Immunology ROS: negative for - hives or itchy/watery  eyes Hematological and Lymphatic ROS: negative for - bleeding problems, bruising or swollen lymph nodes Endocrine ROS: negative for - galactorrhea, hair pattern changes, polydipsia/polyuria or temperature intolerance Respiratory ROS: negative for - cough, hemoptysis, shortness of breath or wheezing Cardiovascular ROS: negative for - chest pain, dyspnea on exertion, edema or irregular heartbeat Gastrointestinal ROS: negative for - abdominal pain, diarrhea, hematemesis, nausea/vomiting or stool incontinence Genito-Urinary ROS: negative for - dysuria, hematuria, incontinence or urinary frequency/urgency Musculoskeletal ROS: negative for - joint swelling or muscular weakness Neurological ROS: as noted in HPI Dermatological ROS: negative for rash and skin lesion changes  Neurologic Examination:                                                                                                      Blood pressure 128/57, pulse 72, temperature 98.2 F (36.8 C), temperature source Oral, resp. rate 20, SpO2 100.00%.  Mental Status: Alert, oriented, thought content appropriate.  Speech fluent at times and intermittent dysarthria without evidence of aphasia.  Able to follow 3 step commands without difficulty. Cranial Nerves: II: Discs flat bilaterally; Visual fields grossly normal, pupils equal, round, reactive to light and accommodation III,IV, VI: ptosis not present, extra-ocular motions intact bilaterally V,VII: smile asymmetric in left NL fold, facial light touch sensation normal bilaterally VIII: hearing normal bilaterally IX,X: gag reflex present XI: bilateral shoulder shrug XII: midline tongue extension Motor: Right : Upper extremity   5/5    Left:     Upper extremity   4+/5  Lower extremity   5/5     Lower extremity   5/5 --orbits right over left Tone and bulk:normal tone throughout; no atrophy noted Sensory: Pinprick and light touch intact throughout, bilaterally Deep Tendon Reflexes: 2+ and  symmetric throughout UE, right KJ 3+ left KJ 2+ and bilateral AJ 2+ Plantars: Right: downgoing   Left: downgoing Cerebellar: normal finger-to-nose,  normal heel-to-shin test CV: pulses palpable throughout    Results for orders placed during the hospital encounter of 05/09/12 (from the past 48 hour(s))  CBC     Status: Abnormal   Collection Time    05/09/12  7:20 AM      Result Value Range   WBC 10.4  4.0 - 10.5 K/uL   RBC 3.71 (*) 4.22 - 5.81 MIL/uL   Hemoglobin 10.9 (*) 13.0 - 17.0 g/dL   HCT 16.1 (*) 09.6 - 04.5 %   MCV 87.9  78.0 - 100.0 fL   MCH 29.4  26.0 - 34.0 pg   MCHC 33.4  30.0 - 36.0 g/dL   RDW 40.9  81.1 - 91.4 %   Platelets 278  150 - 400 K/uL  COMPREHENSIVE METABOLIC PANEL     Status: Abnormal   Collection Time    05/09/12  7:20 AM      Result Value Range   Sodium 136  135 - 145 mEq/L   Potassium 4.3  3.5 - 5.1 mEq/L   Chloride 101  96 - 112 mEq/L   CO2 23  19 - 32 mEq/L   Glucose, Bld 175 (*) 70 - 99 mg/dL   BUN 16  6 - 23 mg/dL   Creatinine, Ser 7.82  0.50 - 1.35 mg/dL   Calcium 8.9  8.4 - 95.6 mg/dL   Total Protein 7.0  6.0 - 8.3 g/dL   Albumin 3.7  3.5 - 5.2 g/dL   AST 17  0 - 37 U/L   ALT 14  0 - 53 U/L   Alkaline Phosphatase 65  39 - 117 U/L   Total Bilirubin 0.5  0.3 - 1.2 mg/dL   GFR calc non Af Amer >90  >90 mL/min   GFR calc Af Amer >90  >90 mL/min   Comment:            The eGFR has been calculated     using the CKD EPI equation.     This calculation has not been     validated in all clinical     situations.     eGFR's persistently     <90 mL/min signify     possible Chronic Kidney Disease.  ETHANOL     Status: None   Collection Time    05/09/12  7:20 AM      Result Value Range   Alcohol, Ethyl (B) <11  0 - 11 mg/dL   Comment:            LOWEST DETECTABLE LIMIT FOR     SERUM ALCOHOL IS 11 mg/dL     FOR MEDICAL PURPOSES ONLY  BILIRUBIN, DIRECT     Status: None   Collection Time    05/09/12  7:20 AM      Result Value Range    Bilirubin, Direct 0.1  0.0 - 0.3 mg/dL  POCT I-STAT TROPONIN I     Status: None   Collection Time    05/09/12  7:40 AM      Result Value Range   Troponin i, poc 0.00  0.00 -  0.08 ng/mL   Comment 3            Comment: Due to the release kinetics of cTnI,     a negative result within the first hours     of the onset of symptoms does not rule out     myocardial infarction with certainty.     If myocardial infarction is still suspected,     repeat the test at appropriate intervals.  POCT I-STAT, CHEM 8     Status: Abnormal   Collection Time    05/09/12  7:43 AM      Result Value Range   Sodium 140  135 - 145 mEq/L   Potassium 4.3  3.5 - 5.1 mEq/L   Chloride 109  96 - 112 mEq/L   BUN 16  6 - 23 mg/dL   Creatinine, Ser 1.61  0.50 - 1.35 mg/dL   Glucose, Bld 096 (*) 70 - 99 mg/dL   Calcium, Ion 0.45  4.09 - 1.30 mmol/L   TCO2 22  0 - 100 mmol/L   Hemoglobin 11.6 (*) 13.0 - 17.0 g/dL   HCT 81.1 (*) 91.4 - 78.2 %  URINALYSIS, ROUTINE W REFLEX MICROSCOPIC     Status: None   Collection Time    05/09/12  9:02 AM      Result Value Range   Color, Urine YELLOW  YELLOW   APPearance CLEAR  CLEAR   Specific Gravity, Urine 1.011  1.005 - 1.030   pH 6.0  5.0 - 8.0   Glucose, UA NEGATIVE  NEGATIVE mg/dL   Hgb urine dipstick NEGATIVE  NEGATIVE   Bilirubin Urine NEGATIVE  NEGATIVE   Ketones, ur NEGATIVE  NEGATIVE mg/dL   Protein, ur NEGATIVE  NEGATIVE mg/dL   Urobilinogen, UA 0.2  0.0 - 1.0 mg/dL   Nitrite NEGATIVE  NEGATIVE   Leukocytes, UA NEGATIVE  NEGATIVE   Comment: MICROSCOPIC NOT DONE ON URINES WITH NEGATIVE PROTEIN, BLOOD, LEUKOCYTES, NITRITE, OR GLUCOSE <1000 mg/dL.  URINE RAPID DRUG SCREEN (HOSP PERFORMED)     Status: None   Collection Time    05/09/12  9:02 AM      Result Value Range   Opiates NONE DETECTED  NONE DETECTED   Cocaine NONE DETECTED  NONE DETECTED   Benzodiazepines NONE DETECTED  NONE DETECTED   Amphetamines NONE DETECTED  NONE DETECTED   Tetrahydrocannabinol NONE  DETECTED  NONE DETECTED   Barbiturates NONE DETECTED  NONE DETECTED   Comment:            DRUG SCREEN FOR MEDICAL PURPOSES     ONLY.  IF CONFIRMATION IS NEEDED     FOR ANY PURPOSE, NOTIFY LAB     WITHIN 5 DAYS.                LOWEST DETECTABLE LIMITS     FOR URINE DRUG SCREEN     Drug Class       Cutoff (ng/mL)     Amphetamine      1000     Barbiturate      200     Benzodiazepine   200     Tricyclics       300     Opiates          300     Cocaine          300     THC              50  PROTIME-INR     Status: None   Collection Time    05/09/12 10:20 AM      Result Value Range   Prothrombin Time 13.6  11.6 - 15.2 seconds   INR 1.05  0.00 - 1.49  APTT     Status: None   Collection Time    05/09/12 10:20 AM      Result Value Range   aPTT 32  24 - 37 seconds  TROPONIN I     Status: None   Collection Time    05/09/12 10:20 AM      Result Value Range   Troponin I <0.30  <0.30 ng/mL   Comment:            Due to the release kinetics of cTnI,     a negative result within the first hours     of the onset of symptoms does not rule out     myocardial infarction with certainty.     If myocardial infarction is still suspected,     repeat the test at appropriate intervals.  GLUCOSE, CAPILLARY     Status: Abnormal   Collection Time    05/09/12 12:08 PM      Result Value Range   Glucose-Capillary 154 (*) 70 - 99 mg/dL   Mr Brain Wo Contrast  05/09/2012  *RADIOLOGY REPORT*  Clinical Data: Slurred speech.  Stroke  MRI HEAD WITHOUT CONTRAST  Technique:  Multiplanar, multiecho pulse sequences of the brain and surrounding structures were obtained according to standard protocol without intravenous contrast.  Comparison: MRI 10/26/2011  Findings: Acute infarct right posterior frontal cortex over the convexity.  This is in the precentral gyrus.  Scattered hyperintensities in the deep white matter bilaterally are similar to the prior MRI and consistent with chronic microvascular ischemia.   Multiple areas of chronic micro hemorrhage in the brain, similar to the prior study.  Question chronic hypertension.  Trauma and cerebral amyloid are  other possible explanations for the chronic micro hemorrhage.  Ventricle size is normal.  Negative for mass or edema.  No midline shift.  Mucosal edema in the paranasal sinuses.  Small retention cyst right maxillary sinus.  IMPRESSION: Acute infarct right posterior frontal cortex over the convexity.  Mild chronic microvascular ischemic changes in the white matter. Chronic micro hemorrhage throughout the brain, unchanged from prior study.   Original Report Authenticated By: Janeece Riggers, M.D.    Dg Chest Port 1 View  05/09/2012  *RADIOLOGY REPORT*  Clinical Data: Slurred speech and chest pain.  PORTABLE CHEST - 1 VIEW  Comparison: 03/27/2012  Findings: The lungs are clear.  No edema, infiltrate or pleural fluid is identified.  Heart size is within normal limits and stable.  Changes are again seen related to prior CABG and heart valve replacement.  IMPRESSION: No active disease.   Original Report Authenticated By: Irish Lack, M.D.     Assessment and plan discussed with with attending physician and they are in agreement.    Felicie Morn PA-C [facility] [phone]  05/09/2012, 1:43 PM  I have seen and evaluated the patient. I have reviewed the above note and made appropriate changes.    Assessment: 77 y.o. male with acute right posterior frontal cortex infarct likely secondary to HTN.  On exam patient shows symptoms of very mild dysarthria and mild left UE weakness. Patient was on ASA 325 prior to admission and since admission changed to Plavix.   Stroke Risk Factors - carotid stenosis, diabetes mellitus,  hyperlipidemia and hypertension  Plan: 1. HgbA1c, fasting lipid panel 2. MRI, MRA  of the brain without contrast 3. PT consult, OT consult, Speech consult 4. Echocardiogram 5. Carotid dopplers 6. Prophylactic therapy-Antiplatelet  med: Plavix - dose 75 mg daily 7. Risk factor modification 8. Telemetry monitoring 9. Frequent neuro checks    Ritta Slot, MD [facility] [phone]  If 7pm- 7am, please page neurology on call at [phone].

## 2012-05-09 NOTE — ED Notes (Signed)
States that his wife thinks that he has slurred speech that started about 0600. Pt states that he thinks his voice sounds funny due to him having a cold. States that he had chest pain early today. Hx of cardiac bypass. No other symptoms noted at present. Speech is not slurred. States that he has a productive cough of green sputum.

## 2012-05-09 NOTE — ED Provider Notes (Signed)
I saw and evaluated the patient, reviewed the resident's note and I agree with the findings and plan.  I personally evaluated the ECG and agree with the interpretation of the resident  Patient awoke with strokelike symptoms.  MRI is consistent with acute infarct.  The patient is not a candidate for TPA given onset greater than 3.5 hours ago.  NIH stroke scale 1 for mild dysarthria.  Admit to the hospital.  Neurologist consult it.  Hospitalist to admit.  Swallow study.  Aspirin after swallow study.  Lyanne Co, MD 05/09/12 1022

## 2012-05-09 NOTE — ED Provider Notes (Signed)
History     CSN: [id number]  Arrival date & time 05/09/12  0712   First MD Initiated Contact with Patient 05/09/12 [phone]      Chief Complaint  Patient presents with  . Chest Pain  . Aphasia    (Consider location/radiation/quality/duration/timing/severity/associated sxs/prior treatment) HPI Comments: 77 y.o PMH CAD s/p CABG (08/2009) s/p cath 03/2012 with 2 vessel disease patent SVG to OM 3 (01/2012 EF 55-60%), patent SVG to [facility] in RCA, s/p AVR 2012, s/p MVR, syncope, dyslipidemia, depression, HTN, DM, esophageal stricture, status post CEA right neck.  He will follow up with cardiology Monday for intermittent chest pain which is currently being managed by medical management.    Patient said he woke up with those symptoms and wife noticed slurred speech this am about 6:30 am.  He has not tried to eat or drink this am and has only taken Prilosec.  Otherwise see ROS denies.  Denies abnormal sensation to tongue.       PCP: Dr. Doristine Locks  The history is provided by the patient and the spouse. No language interpreter was used.    Past Medical History  Diagnosis Date  . Hyperlipidemia   . Mitral valve prolapse   . Aortic valve regurgitation   . Degenerative disk disease   . Major depression   . Spinal stenosis   . Coronary artery disease     post stents; prior CABG; s/p cath January 2014 with 2 VD, patent SVG to [facility] and patent SVG to PL patent  . Skin cancer of nose 2012    "right side" (03/26/2012)  . Skin cancer of trunk     "left chest" (03/26/2012)  . Hypertension   . Carotid artery occlusion   . Heart murmur   . Anginal pain   . OSA (obstructive sleep apnea)     "have a mask; haven't used one in years" (03/26/2012)  . Type II diabetes mellitus   . History of esophageal stricture     "has it stretched q once in awhile" (03/26/2012"  . Kidney stones     Past Surgical History  Procedure Laterality Date  . Adenoidectomy  1956  . Lumbar disc surgery  1996  . Carotid endarterectomy   05/05/2003    right carotid endarterectomy  . Shoulder arthroscopy  01/07/2007    Left shoulder impingement, labral tear  . Cardiac catheterization  10/20/2003    Est. EF of 65% -- Critical disease in the mid to distal circumflex --  Preserved left ventricular  function --   . Coronary angioplasty with stent placement  10/20/2003    Percutaneous coronary intervention/drug-eluding stent implantation, third marginal branch --  Percutaneous closure, right femoral artery -- Atherosclerotic coronary vascular disease, single vessel / Status post successful percutaneous coronary intervention/tandem drug -- eluding stent implantation proximal third marginal branch -- Typical angina was not reproduced with device insertion or balloon   . Hematoma evacuation  05/09/2003    Evacuation of hematoma, right neck  . Coronary artery bypass graft  08/29/2010    CABG X2; SVG to PDA, SVG to OM2- [facility]  . Aortic valve replacement  08/29/2010    25 mm CE Magna bioprosthetic - [facility]  . Mitral valve repair  08/23/2010    28 mm Simulus ring -[facility]  . Tonsillectomy and adenoidectomy  1956  . Appendectomy  1951  . Cardiac valve replacement    . Posterior fusion lumbar spine  1997  . Cervical disc surgery  1980's  .  Anterior cervical decomp/discectomy fusion  2000's  . Skin cancer excision  1990's; 2012    "left chest & right nose" (03/26/2012)    Family History  Problem Relation Age of Onset  . Diabetes Mother   . Coronary artery disease Mother   . Heart disease Mother   . Stroke Father   . Cancer Daughter     History  Substance Use Topics  . Smoking status: Former Smoker -- 5.00 packs/day for 23 years    Types: Cigarettes    Quit date: 10/02/1978  . Smokeless tobacco: Never Used  . Alcohol Use: No     Comment: 03/26/2012 "last alcohol was in 1970; never had problem w/it"      Review of Systems  HENT: Positive for congestion.        Congestion x 3 weeks. Able to smell   Respiratory: Positive for  cough.        Dry cough x 3 weeks  Cardiovascular: Negative for chest pain.  Neurological: Positive for speech difficulty. Negative for dizziness, weakness, light-headedness and headaches.       Gait normal  All other systems reviewed and are negative.    Allergies  Review of patient's allergies indicates no known allergies.  Home Medications   Current Outpatient Rx  Name  Route  Sig  Dispense  Refill  . amLODipine (NORVASC) 5 MG tablet   Oral   Take by mouth daily. Take 1/2 Tab Daily          . aspirin 325 MG tablet   Oral   Take 325 mg by mouth daily.           [name] FLUoxetine (PROZAC) 20 MG capsule   Oral   Take 20 mg by mouth daily.         . isosorbide mononitrate (IMDUR) 60 MG 24 hr tablet   Oral   Take 120 mg by mouth daily.         . Melatonin 3 MG TABS   Oral   Take 1 tablet by mouth daily.         . metFORMIN (GLUMETZA) 1000 MG (MOD) 24 hr tablet   Oral   Take 1 tablet (1,000 mg total) by mouth 2 (two) times daily with a meal.         . metoprolol tartrate (LOPRESSOR) 25 MG tablet   Oral   Take 25 mg by mouth 2 (two) times daily.         . Multiple Vitamin (MULTIVITAMIN PO)   Oral   Take by mouth.           . nitroGLYCERIN (NITROSTAT) 0.4 MG SL tablet   Sublingual   Place 1 tablet (0.4 mg total) under the tongue every 5 (five) minutes as needed. For chest pain   25 tablet   5   . omeprazole (PRILOSEC) 20 MG capsule   Oral   Take 20 mg by mouth daily.           . Pseudoeph-Doxylamine-DM-APAP (NYQUIL) 60-7.08-16-998 MG/30ML LIQD   Oral   Take by mouth.         . rosuvastatin (CRESTOR) 10 MG tablet   Oral   Take 10 mg by mouth daily.           . vitamin C (ASCORBIC ACID) 500 MG tablet   Oral   Take 500 mg by mouth 2 (two) times daily.  BP 151/63  Pulse 72  Temp(Src) 97.7 F (36.5 C) (Oral)  Resp 17  SpO2 100%  Physical Exam  Nursing note and vitals reviewed. Constitutional: He is oriented to  person, place, and time. He appears well-developed and well-nourished. He is cooperative. No distress.  HENT:  Head: Normocephalic and atraumatic.  Mouth/Throat: Oropharynx is clear and moist and mucous membranes are normal. No oropharyngeal exudate.  Eyes: Conjunctivae are normal. Pupils are equal, round, and reactive to light. Right eye exhibits no discharge. Left eye exhibits no discharge. No scleral icterus.  Cardiovascular: Normal rate, regular rhythm, S1 normal, S2 normal and normal heart sounds.   No murmur heard. Pulmonary/Chest: Effort normal and breath sounds normal. No respiratory distress. He has no wheezes.  Abdominal: Soft. Bowel sounds are normal. He exhibits no distension. There is no tenderness.  Obese abdomen   Musculoskeletal: He exhibits no edema.  Neurological: He is alert and oriented to person, place, and time. He has normal strength. No cranial nerve deficit or sensory deficit. He displays a negative Romberg sign. Coordination normal.  CN 2-12 intact.   Skin: Skin is warm, dry and intact. No rash noted. He is not diaphoretic.  Psychiatric: He has a normal mood and affect. His speech is normal and behavior is normal. Judgment and thought content normal. Cognition and memory are normal.    ED Course  Procedures (including critical care time)  Labs Reviewed  CBC - Abnormal; Notable for the following:    RBC 3.71 (*)    Hemoglobin 10.9 (*)    HCT 32.6 (*)    All other components within normal limits  COMPREHENSIVE METABOLIC PANEL - Abnormal; Notable for the following:    Glucose, Bld 175 (*)    All other components within normal limits  POCT I-STAT, CHEM 8 - Abnormal; Notable for the following:    Glucose, Bld 176 (*)    Hemoglobin 11.6 (*)    HCT 34.0 (*)    All other components within normal limits  ETHANOL  BILIRUBIN, DIRECT  URINALYSIS, ROUTINE W REFLEX MICROSCOPIC  URINE RAPID DRUG SCREEN (HOSP PERFORMED)  PROTIME-INR  APTT  TROPONIN I  POCT I-STAT  TROPONIN I   Mr Brain Wo Contrast  05/09/2012  *RADIOLOGY REPORT*  Clinical Data: Slurred speech.  Stroke  MRI HEAD WITHOUT CONTRAST  Technique:  Multiplanar, multiecho pulse sequences of the brain and surrounding structures were obtained according to standard protocol without intravenous contrast.  Comparison: MRI 10/26/2011  Findings: Acute infarct right posterior frontal cortex over the convexity.  This is in the precentral gyrus.  Scattered hyperintensities in the deep white matter bilaterally are similar to the prior MRI and consistent with chronic microvascular ischemia.  Multiple areas of chronic micro hemorrhage in the brain, similar to the prior study.  Question chronic hypertension.  Trauma and cerebral amyloid are  other possible explanations for the chronic micro hemorrhage.  Ventricle size is normal.  Negative for mass or edema.  No midline shift.  Mucosal edema in the paranasal sinuses.  Small retention cyst right maxillary sinus.  IMPRESSION: Acute infarct right posterior frontal cortex over the convexity.  Mild chronic microvascular ischemic changes in the white matter. Chronic micro hemorrhage throughout the brain, unchanged from prior study.   Original Report Authenticated By: Janeece Riggers, M.D.    Dg Chest Port 1 View  05/09/2012  *RADIOLOGY REPORT*  Clinical Data: Slurred speech and chest pain.  PORTABLE CHEST - 1 VIEW  Comparison: 03/27/2012  Findings: The  lungs are clear.  No edema, infiltrate or pleural fluid is identified.  Heart size is within normal limits and stable.  Changes are again seen related to prior CABG and heart valve replacement.  IMPRESSION: No active disease.   Original Report Authenticated By: Irish Lack, M.D.      1. Stroke   2. CAD (coronary artery disease)   3. CVA (cerebral infarction)   4. Occlusion and stenosis of carotid artery without mention of cerebral infarction     Date: 05/09/2012  Rate: 69  Rhythm: normal sinus rhythm  QRS Axis: normal   Intervals: normal  ST/T Wave abnormalities: normal  Conduction Disutrbances:none  Narrative Interpretation:   Old EKG Reviewed: unchanged     MDM  Ddx: TIA/stroke Labs, UDS, ethanol, MRI Aspirin 325 mg qd  Called Dr. Petra Kuba who will see the patient at [facility] hospitalist Thedore Mins) for admission to [facility] for acute right infarction w/u   Shirlee Latch MD [phone]        Annett Gula, MD 05/09/12 1478  Annett Gula, MD 05/09/12 1012

## 2012-05-09 NOTE — ED Notes (Signed)
MD at bedside. 

## 2012-05-10 DIAGNOSIS — I2510 Atherosclerotic heart disease of native coronary artery without angina pectoris: Secondary | ICD-10-CM | POA: Diagnosis not present

## 2012-05-10 DIAGNOSIS — I6529 Occlusion and stenosis of unspecified carotid artery: Secondary | ICD-10-CM | POA: Diagnosis not present

## 2012-05-10 DIAGNOSIS — I10 Essential (primary) hypertension: Secondary | ICD-10-CM | POA: Diagnosis present

## 2012-05-10 DIAGNOSIS — I6350 Cerebral infarction due to unspecified occlusion or stenosis of unspecified cerebral artery: Secondary | ICD-10-CM | POA: Diagnosis not present

## 2012-05-10 DIAGNOSIS — I369 Nonrheumatic tricuspid valve disorder, unspecified: Secondary | ICD-10-CM

## 2012-05-10 LAB — BASIC METABOLIC PANEL
BUN: 11 mg/dL (ref 6–23)
Calcium: 9.5 mg/dL (ref 8.4–10.5)
Creatinine, Ser: 0.77 mg/dL (ref 0.50–1.35)
GFR calc Af Amer: >90 mL/min (ref >90)
GFR calc non Af Amer: 87 mL/min — ABNORMAL LOW (ref >90)
Glucose, Bld: 143 mg/dL — ABNORMAL HIGH (ref 70–99)
Potassium: 4.3 mEq/L (ref 3.5–5.1)

## 2012-05-10 LAB — LIPID PANEL
HDL: 40 mg/dL (ref >39)
LDL Cholesterol: 54 mg/dL (ref 0–99)

## 2012-05-10 LAB — GLUCOSE, CAPILLARY
Glucose-Capillary: 145 mg/dL — ABNORMAL HIGH (ref 70–99)
Glucose-Capillary: 167 mg/dL — ABNORMAL HIGH (ref 70–99)
Glucose-Capillary: 174 mg/dL — ABNORMAL HIGH (ref 70–99)

## 2012-05-10 LAB — CBC
Hemoglobin: 11.4 g/dL — ABNORMAL LOW (ref 13.0–17.0)
MCH: 29.9 pg (ref 26.0–34.0)
MCHC: 35.1 g/dL (ref 30.0–36.0)
Platelets: 266 10*3/uL (ref 150–400)
RDW: 13.4 % (ref 11.5–15.5)

## 2012-05-10 LAB — PROTIME-INR
INR: 0.96 (ref 0.00–1.49)
Prothrombin Time: 12.7 seconds (ref 11.6–15.2)

## 2012-05-10 LAB — TROPONIN I
Troponin I: <0.3 ng/mL (ref <0.30)
Troponin I: <0.3 ng/mL (ref <0.30)
Troponin I: <0.3 ng/mL (ref <0.30)

## 2012-05-10 LAB — HEMOGLOBIN A1C: Hgb A1c MFr Bld: 7.9 % — ABNORMAL HIGH (ref <5.7)

## 2012-05-10 MED ORDER — ASPIRIN 81 MG PO CHEW: 81.0000 mg | CHEWABLE_TABLET | Freq: Every day | ORAL | Status: DC

## 2012-05-10 MED ORDER — ASPIRIN 81 MG PO CHEW
81.0000 mg | CHEWABLE_TABLET | Freq: Every day | ORAL | Status: DC
  Administered 2012-05-10: 81 mg via ORAL
  Filled 2012-05-10: qty 1

## 2012-05-10 MED ORDER — ASPIRIN-DIPYRIDAMOLE ER 25-200 MG PO CP12: 1.0000 | ORAL_CAPSULE | Freq: Two times a day (BID) | ORAL | Status: DC

## 2012-05-10 MED ORDER — ATORVASTATIN CALCIUM 40 MG PO TABS
40.0000 mg | ORAL_TABLET | Freq: Every day | ORAL | Status: DC
  Administered 2012-05-10: 40 mg via ORAL
  Filled 2012-05-10: qty 1

## 2012-05-10 MED ORDER — ASPIRIN-DIPYRIDAMOLE ER 25-200 MG PO CP12
1.0000 | ORAL_CAPSULE | Freq: Every day | ORAL | Status: DC
  Filled 2012-05-10: qty 1

## 2012-05-10 NOTE — Progress Notes (Signed)
OT Cancellation/Discharge Note  Patient Details Name: [name] MRN: [id number] DOB: 27-Dec-1935   Cancelled Treatment:    Reason Eval/Treat Not Completed:  Pt is performing independently in ADL/IADL and mobility.  No concerns of cognitive issues, slurred speech is resolving.  Educated pt and family in signs and symptoms of stroke.  Did not proceed with full eval.  Evern Bio 05/10/2012, 11:44 AM

## 2012-05-10 NOTE — Progress Notes (Signed)
  Echocardiogram 2D Echocardiogram has been performed.  [name] [name] 05/10/2012, 10:13 AM

## 2012-05-10 NOTE — Progress Notes (Signed)
Speech Language Pathology Treatment Patient Details Name: [name] MRN: [id number] DOB: Aug 06, 1935 Today's Date: 05/10/2012 Time: 4098-1191 SLP Time Calculation (min): 12 min  Assessment / Plan / Recommendation Clinical Impression  Speech has improved since last session on 2/20.  Patient and wife agree that speech is better, but not quite 100%.  Patient still has soft vocal quality that is different from his norm.  Reviewed strategies for improving vocal intensity (coordination of respiration/phonation) and patient was able to demonstrate improvement.  Cognition appears to be back to baseline (wife and patient agree).    SLP Plan  Discharge SLP treatment due to (comment);All goals met (Pt. for d/c home today)    Pertinent Vitals/Pain N/A  SLP Goals  SLP Goals Progress/Goals/Alternative treatment plan discussed with pt/caregiver and they: Agree SLP Goal #1 - Progress: Met SLP Goal #2 - Progress: Met SLP Goal #3 - Progress: Partly met (Appears back to baseline.  Further testing deferred.)  General Behavior/Cognition: Alert;Cooperative;Pleasant mood  Oral Cavity - Oral Hygiene     Treatment Treatment focused on: Dysarthria;Cognition Skilled Treatment: Dysarthria treatment; compensatory strategies; assessment of f/u needs   GO     Maryjo Rochester T 05/10/2012, 3:14 PM

## 2012-05-10 NOTE — Clinical Documentation Improvement (Signed)
GENERIC DOCUMENTATION CLARIFICATION QUERY  THIS DOCUMENT IS NOT A PERMANENT PART OF THE MEDICAL RECORD  TO RESPOND TO THE THIS QUERY, FOLLOW THE INSTRUCTIONS BELOW:  1. If needed, update documentation for the patient's encounter via the notes activity.  2. Access this query again and click edit on the In [employer].  3. After updating, or not, click F2 to complete all highlighted (required) fields concerning your review. Select "additional documentation in the medical record" OR "no additional documentation provided".  4. Click Sign note button.  5. The deficiency will fall out of your In Basket *Please let us know if you are not able to complete this workflow by phone or e-mail (listed below).  Please update your documentation within the medical record to reflect your response to this query.                                                                                        05/10/12   Dear Dr. Mahala Menghini  / Associates,  In a better effort to capture your patient's severity of illness/SOI, risk of mortality/ROM, reflect appropriate length of stay/LOS and utilization of resources, a review of the patient medical record has revealed the following indicators.   PLEASE CLARIFY 'WEAKNESS' IN NOTES AND DC SUMMARY. THANK YOU.  Possible Clinical Conditions? - Left upper extremity mild hemiparesis - Other Condition (please specify)  Supporting Information: - Risk Factors: Acute CVA - Signs & Symptoms: "mild left UE weakness"   You may use possible, probable, or suspect with inpatient documentation. possible, probable, suspected diagnoses MUST be documented at the time of discharge  Reviewed:  no additional documentation provided  Thank You,  Beverley Fiedler RN BSN Clinical Documentation Specialist: Tele:  [phone]  Health Information Management [place]

## 2012-05-10 NOTE — Progress Notes (Signed)
PT NOTE:  Received PT orders, reviewed chart, and spoke with patient and wife.  Pt reports his symptoms were strictly speech related and he has been up walking the halls with no difficulty.  Pt declined PT eval at this time.  Pt and wife educated on risk factors and signs/symptoms of stroke.  PT will sign off at this time. Silvio Clayman, PT

## 2012-05-10 NOTE — Progress Notes (Signed)
*  PRELIMINARY RESULTS* Vascular Ultrasound Carotid Duplex (Doppler) has been completed.  Preliminary findings: Right ICA: no stenosis. Left ICA: <40% stenosis. Bilateral vertebrals: antegrade flow.   Shawnika Pepin FRANCES 05/10/2012, 10:32 AM

## 2012-05-10 NOTE — Progress Notes (Signed)
Subjective: Speech is much better  Objective: Vital signs in last 24 hours: Temp:  [97.7 F (36.5 C)-98.2 F (36.8 C)] 98.1 F (36.7 C) (02/21 0600) Pulse Rate:  [72-90] 90 (02/21 0600) Resp:  [16-20] 16 (02/21 0600) BP: (118-151)/(54-75) 118/54 mmHg (02/21 0600) SpO2:  [95 %-100 %] 98 % (02/21 0600) Weight:  [74.844 kg (165 lb)] 74.844 kg (165 lb) (02/20 1300) Weight change:  Last BM Date: 05/08/12  Intake/Output from previous day: 02/20 0701 - 02/21 0700 In: 600 [P.O.:600] Out: -  Intake/Output this shift:    General appearance: alert and cooperative Neck: no carotid bruit Resp: clear to auscultation bilaterally Cardio: regular rate and rhythm, S1, S2 normal, no murmur, click, rub or gallop GI: soft, non-tender; bowel sounds normal; no masses,  no organomegaly Extremities: extremities normal, atraumatic, no cyanosis or edema Neurologic: Mental status: Alert, oriented, thought content appropriate Cranial nerves: normal Motor: strength 5+ all extremities, no pronater drift Coordination: normal  Lab Results:  Recent Labs  05/09/12 0720 05/09/12 0743 05/10/12 0229  WBC 10.4  --  11.1*  HGB 10.9* 11.6* 11.4*  HCT 32.6* 34.0* 32.5*  PLT 278  --  266   BMET  Recent Labs  05/09/12 0720 05/09/12 0743 05/10/12 0229  NA 136 140 140  K 4.3 4.3 4.3  CL 101 109 104  CO2 23  --  26  GLUCOSE 175* 176* 143*  BUN 16 16 11   CREATININE 0.70 0.80 0.77  CALCIUM 8.9  --  9.5    Studies/Results: Mr Brain Wo Contrast  05/09/2012  *RADIOLOGY REPORT*  Clinical Data: Slurred speech.  Stroke  MRI HEAD WITHOUT CONTRAST  Technique:  Multiplanar, multiecho pulse sequences of the brain and surrounding structures were obtained according to standard protocol without intravenous contrast.  Comparison: MRI 10/26/2011  Findings: Acute infarct right posterior frontal cortex over the convexity.  This is in the precentral gyrus.  Scattered hyperintensities in the deep white matter  bilaterally are similar to the prior MRI and consistent with chronic microvascular ischemia.  Multiple areas of chronic micro hemorrhage in the brain, similar to the prior study.  Question chronic hypertension.  Trauma and cerebral amyloid are  other possible explanations for the chronic micro hemorrhage.  Ventricle size is normal.  Negative for mass or edema.  No midline shift.  Mucosal edema in the paranasal sinuses.  Small retention cyst right maxillary sinus.  IMPRESSION: Acute infarct right posterior frontal cortex over the convexity.  Mild chronic microvascular ischemic changes in the white matter. Chronic micro hemorrhage throughout the brain, unchanged from prior study.   Original Report Authenticated By: Janeece Riggers, M.D.    Dg Chest Port 1 View  05/09/2012  *RADIOLOGY REPORT*  Clinical Data: Slurred speech and chest pain.  PORTABLE CHEST - 1 VIEW  Comparison: 03/27/2012  Findings: The lungs are clear.  No edema, infiltrate or pleural fluid is identified.  Heart size is within normal limits and stable.  Changes are again seen related to prior CABG and heart valve replacement.  IMPRESSION: No active disease.   Original Report Authenticated By: Irish Lack, M.D.     Medications: I have reviewed the patient's current medications.  Assessment/Plan: Principal Problem:   CVA (cerebral infarction) R frontal, speech almost back at baseline, no other obvious deficits.  MRA, echo and carotid US today, PT and OT to see.   Active Problems:   CAD (coronary artery disease) stable angina on nitrate   Occlusion and stenosis of carotid artery  without mention of cerebral infarction S/P R CEA   Essential hypertension, benign controlled   Depression on prozac for years, interacts with plavix, discuss with neurology, may need new antidepressant.    LOS: 1 day   [name] 05/10/2012, 7:06 AM

## 2012-05-10 NOTE — Progress Notes (Signed)
Stroke Team Progress Note  HISTORY [name] is an 77 y.o. male with multiple stroke risk factors. Patient was his normal baseline last night 05/08/2012 and awoke this AM 05/09/2012 with slurred speech and left nasolabial fold decrease noted by wife. Patient was brought to [facility] hospital where CT head was normal but MRI head showed a acute infarct in right posterior frontal cortex. Patient was transferred to [facility] hospital. Currently patient shows dysarthria when he speaks quickly and mild left UE weakness.  Patient was not a TPA candidate secondary to delay in arrival. He was admitted for further evaluation and treatment.  SUBJECTIVE His wife and daughter are at the bedside.  Overall he feels his condition is stable.   OBJECTIVE Most recent Vital Signs: Filed Vitals:   05/09/12 2131 05/09/12 2300 05/10/12 0100 05/10/12 0600  BP: 122/69 126/75 125/65 118/54  Pulse: 86 84 86 90  Temp: 97.9 F (36.6 C) 97.8 F (36.6 C) 98 F (36.7 C) 98.1 F (36.7 C)  TempSrc:      Resp: 16 16 16 16   Height:      Weight:      SpO2: 96% 97% 95% 98%   CBG (last 3)   Recent Labs  05/09/12 1633 05/09/12 2103 05/10/12 0639  GLUCAP 234* 145* 167*    IV Fluid Intake:     MEDICATIONS  . atorvastatin  40 mg Oral q1800  . clopidogrel  75 mg Oral Q breakfast  . FLUoxetine  20 mg Oral Daily  . heparin  5,000 Units Subcutaneous Q8H  . insulin aspart  0-9 Units Subcutaneous TID WC  . isosorbide mononitrate  120 mg Oral Daily  . pantoprazole  40 mg Oral Daily  . sodium chloride  3 mL Intravenous Q12H  . vitamin C  500 mg Oral BID   PRN:  albuterol, guaiFENesin-dextromethorphan, HYDROcodone-acetaminophen, metoprolol, nitroGLYCERIN, ondansetron (ZOFRAN) IV, ondansetron, polyethylene glycol, zolpidem  Diet:  Carb Control thin liquids Activity:    Up with assistance DVT Prophylaxis:  Heparin 5000 units sq tid  CLINICALLY SIGNIFICANT STUDIES Basic Metabolic Panel:  Recent Labs Lab 05/09/12 0720  05/09/12 0743 05/10/12 0229  NA 136 140 140  K 4.3 4.3 4.3  CL 101 109 104  CO2 23  --  26  GLUCOSE 175* 176* 143*  BUN 16 16 11   CREATININE 0.70 0.80 0.77  CALCIUM 8.9  --  9.5   Liver Function Tests:  Recent Labs Lab 05/09/12 0720  AST 17  ALT 14  ALKPHOS 65  BILITOT 0.5  PROT 7.0  ALBUMIN 3.7   CBC:  Recent Labs Lab 05/09/12 0720 05/09/12 0743 05/10/12 0229  WBC 10.4  --  11.1*  HGB 10.9* 11.6* 11.4*  HCT 32.6* 34.0* 32.5*  MCV 87.9  --  85.3  PLT 278  --  266   Coagulation:  Recent Labs Lab 05/09/12 1020 05/10/12 0229  LABPROT 13.6 12.7  INR 1.05 0.96   Cardiac Enzymes:  Recent Labs Lab 05/09/12 1020 05/10/12 0227 05/10/12 0828  TROPONINI <0.30 <0.30 <0.30   Urinalysis:  Recent Labs Lab 05/09/12 0902  COLORURINE YELLOW  LABSPEC 1.011  PHURINE 6.0  GLUCOSEU NEGATIVE  HGBUR NEGATIVE  BILIRUBINUR NEGATIVE  KETONESUR NEGATIVE  PROTEINUR NEGATIVE  UROBILINOGEN 0.2  NITRITE NEGATIVE  LEUKOCYTESUR NEGATIVE   Lipid Panel    Component Value Date/Time   CHOL 117 05/10/2012 0229   TRIG 114 05/10/2012 0229   HDL 40 05/10/2012 0229   CHOLHDL 2.9 05/10/2012 0229  VLDL 23 05/10/2012 0229   LDLCALC 54 05/10/2012 0229   HgbA1C  No results found for this basename: HGBA1C    Urine Drug Screen:     Component Value Date/Time   LABOPIA NONE DETECTED 05/09/2012 0902   COCAINSCRNUR NONE DETECTED 05/09/2012 0902   LABBENZ NONE DETECTED 05/09/2012 0902   AMPHETMU NONE DETECTED 05/09/2012 0902   THCU NONE DETECTED 05/09/2012 0902   LABBARB NONE DETECTED 05/09/2012 0902    Alcohol Level:  Recent Labs Lab 05/09/12 0720  ETH <11   CT of the brain    MRI of the brain  05/09/2012 Acute infarct right posterior frontal cortex over the convexity.  Mild chronic microvascular ischemic changes in the white matter. Chronic micro hemorrhage throughout the brain, unchanged from prior study.   MRA of the brain  See Aug 2013 results  2D Echocardiogram   pending  Carotid Doppler  Preliminary findings: Right ICA: no stenosis. Left ICA: <40% stenosis. Bilateral vertebrals: antegrade flow.   CXR  05/09/2012   No active disease.     EKG  normal sinus rhythm.   Therapy Recommendations pending  Physical Exam   Pleasant elderly caucasian male in no distress.Awake alert. Afebrile. Head is nontraumatic. Neck is supple without bruit. Hearing is normal. Cardiac exam no murmur or gallop. Lungs are clear to auscultation. Distal pulses are well felt. Neurological Exam : Awake  Alert oriented x 3. Normal speech and language.eye movements full without nystagmus. Face asymmetric with minimal left nasolabial fold asymmetry. Tongue midline. Normal strength, tone, reflexes and coordination. Normal sensation. Gait deferred. ASSESSMENT Mr. [name] is a 77 y.o. male presenting with slurred speech and left nasolabial fold decrease. Imaging confirms a right posterior frontal infarct. Infarct felt to be  embolic secondary to unknown etiology.  On aspirin 325 mg orally every day prior to admission. Changed to clopidogrel 75 mg orally every day for secondary stroke prevention on admission. Work up underway.  Hyperlipidemia, LDL 54, on statin PTA, on statin now, at goal LDL < 100 MVP AV regurg CAD, hx CABG and stents Hypertension OSA Diabetes, HgbA1c pending Hx R CEA  Hospital day # 1  TREATMENT/PLAN  Change plavix to  dipyridamole SR 250 mg/aspirin 25 mg orally twice a day for secondary stroke prevention given hx of interaction of plavix with prozac. To prevent headache, most common side effect of Aggrenox, will start Aggrenox q hs x 2 weeks then increase Aggrenox to bid.  Until then, aspirin 81 mg q am x 2 weeks, then discontinue. May take Tylenol 650 mg 1 hr prior to Aggrenox for the first week, then discontinue.  TEE to look for embolic source. Please arrange with pts cardiologist (Dr. Elease Hashimoto). Ok to do as an outpatient as TEEs are not done over  the weekend in the hospital. If positive for PFO (patent foramen ovale), check bilateral lower extremity venous dopplers to rule out DVT as possible source of stroke.  Recommend loop recorder placement as OP telemetry monitoring to assess patient for atrial fibrillation as source of stroke in December 2013 was unrevealing. Dr. Pearlean Brownie will discuss with Dr. Elease Hashimoto. Crystal AF trial reveals identification of afib 11% in 1 year, 30% in 3 years in suspicious embolic infarcts. Ok for discharge from stroke standpoint later this afternoon  Dr. Pearlean Brownie discussed case via telephone with Dr. Valentina Lucks.  Annie Main, MSN, RN, ANVP-BC, ANP-BC, GNP-BC [facility] Pager: [phone] 05/10/2012 10:57 AM  I have personally obtained a history, examined the  patient, evaluated imaging results, and formulated the assessment and plan of care. I agree with the above. Delia Heady, MD Medical Director [facility] Pager: [phone] 05/10/2012 2:12 PM

## 2012-05-13 ENCOUNTER — Encounter: Payer: Self-pay | Admitting: Cardiovascular Disease

## 2012-05-13 ENCOUNTER — Ambulatory Visit (INDEPENDENT_AMBULATORY_CARE_PROVIDER_SITE_OTHER): Payer: Medicare Other | Admitting: Cardiovascular Disease

## 2012-05-13 ENCOUNTER — Encounter: Payer: Self-pay | Admitting: *Deleted

## 2012-05-13 VITALS — BP 124/70 | HR 79 | Ht 66.0 in | Wt 170.1 lb

## 2012-05-13 DIAGNOSIS — I6350 Cerebral infarction due to unspecified occlusion or stenosis of unspecified cerebral artery: Secondary | ICD-10-CM

## 2012-05-13 DIAGNOSIS — I639 Cerebral infarction, unspecified: Secondary | ICD-10-CM

## 2012-05-13 NOTE — Patient Instructions (Addendum)
Your physician has requested that you have a TEE. During a TEE, sound waves are used to create images of your heart. It provides your doctor with information about the size and shape of your heart and how well your heart's chambers and valves are working. In this test, a transducer is attached to the end of a flexible tube that's guided down your throat and into your esophagus (the tube leading from you mouth to your stomach) to get a more detailed image of your heart. You are not awake for the procedure. Please see the instruction sheet given to you today.   You have been referred to EP PHYSICIAN .Marland KitchenMarland KitchenSOONEST AVAILABLE APP FOR LOOP RECORDER   Your physician recommends that you continue on your current medications as directed. Please refer to the Current Medication list given to you today.

## 2012-05-13 NOTE — Assessment & Plan Note (Signed)
[name] has had a recent CVA that the stroke team thinks may be due to an embolic source.  We will schedule a TEE to look for source of emboli.  We will also place an implantable loop recorder to look for atrial fib ( assuming that we do not find a PFO or other cause fof CVA)

## 2012-05-13 NOTE — Progress Notes (Signed)
[name] Date of Birth  July 16, 1935 [facility] [address]    [address] [place], [place]  [address] [phone]  Fax  [phone]  Problem List 1. CAD, AVR, MV reapir 2. Dizziness   History of Present Illness:  [name] is a 77 year old gentleman with a history of coronary artery disease. He status post coronary artery bypass grafting, aortic valve replacement, and mitral valve repair. ( August 23, 2010).  He typically  walks on a regular basis. He has not been walking recently because of schedule issues but will be starting soon.  He had an echocardiogram earlier this morning to followup with his aortic valve replacement and mitral valve repair.  Nov. 4, 2013 - he presents today with some mild chest twinges - not similar to his angina.  Also has some dizziness / lightheadedness.  One episode occurred while he was sitting and a second episode occurred yesterday while he was running a leaf blower.  Dec. 16, 2013: He continues to have mild episodes of the room spinning.  The other day he had severe episodes while lying down - symptoms are c/w vertigo. [name] He also has had some episodes of chest twinges. These last for 1-2 seconds. They're not necessarily associated with twisting or turning of his torso. They're not associated with exercise.  He has not been exercising much.   March 26, 2012: He presents today with 2 days of chest discomfort.  The pains are  similar to his angina pain but not exactly the same.  This pain is more on the left side and his previous episodes of angina ( before his stents) were  The pain is a constant ache for the past 2 days.  There is no associated dyspnea.  The pains are not worsened with walking.  He was walking regularly until 2 days ago when the weather turned cold.  He has not tried any NTG.  He rates the pain at 7/10 last night.  The pains seems to ease with hot coffee and hot soup and currently the pain is 3/10.  We gave his a NTG in the  office and the pain completely resolved.   ECG was NSR with no ST or T wave changes.  Feb. 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  His symptoms resolved very quickly .  He did not receive tPA because the time of the onset was not clear.  MRI of the head revealed a new CVA.  He still has some "fuzzyness" in his head but has made significant improvement.  Dr. Pearlean [name] has requested a TEE and implantable loop recorder.  Current Outpatient Prescriptions on File Prior to Visit  Medication Sig Dispense Refill  . amLODipine (NORVASC) 5 MG tablet Take by mouth daily. Take 1/2 Tab Daily       . aspirin 81 MG chewable tablet Chew 1 tablet (81 mg total) by mouth daily at 6 PM.  30 tablet  0  . dipyridamole-aspirin (AGGRENOX) 200-25 MG per 12 hr capsule Take 1 capsule by mouth 2 (two) times daily.  60 capsule  11  . FLUoxetine (PROZAC) 20 MG capsule Take 20 mg by mouth daily.      . isosorbide mononitrate (IMDUR) 60 MG 24 hr tablet Take 120 mg by mouth daily.      . Melatonin 3 MG TABS Take 1 tablet by mouth daily.      . metFORMIN (GLUMETZA) 1000 MG (MOD) 24 hr tablet Take 1  tablet (1,000 mg total) by mouth 2 (two) times daily with a meal.      . metoprolol tartrate (LOPRESSOR) 25 MG tablet Take 25 mg by mouth 2 (two) times daily.      . Multiple Vitamin (MULTIVITAMIN PO) Take by mouth.        . nitroGLYCERIN (NITROSTAT) 0.4 MG SL tablet Place 1 tablet (0.4 mg total) under the tongue every 5 (five) minutes as needed. For chest pain  25 tablet  5  . omeprazole (PRILOSEC) 20 MG capsule Take 20 mg by mouth daily.        . Pseudoeph-Doxylamine-DM-APAP (NYQUIL) 60-7.08-16-998 MG/30ML LIQD Take by mouth.      . rosuvastatin (CRESTOR) 10 MG tablet Take 10 mg by mouth daily.        . vitamin C (ASCORBIC ACID) 500 MG tablet Take 500 mg by mouth 2 (two) times daily.         No current facility-administered medications on file prior to visit.    No Known Allergies  Past Medical History   Diagnosis Date  . Hyperlipidemia   . Mitral valve prolapse   . Aortic valve regurgitation   . Degenerative disk disease   . Major depression   . Spinal stenosis   . Coronary artery disease     post stents; prior CABG; s/p cath January 2014 with 2 VD, patent SVG to [facility] and patent SVG to PL patent  . Skin cancer of nose 2012    "right side" (03/26/2012)  . Skin cancer of trunk     "left chest" (03/26/2012)  . Hypertension   . Carotid artery occlusion   . Heart murmur   . Anginal pain   . OSA (obstructive sleep apnea)     "have a mask; haven't used one in years" (03/26/2012)  . Type II diabetes mellitus   . History of esophageal stricture     "has it stretched q once in awhile" (03/26/2012"  . Kidney stones     Past Surgical History  Procedure Laterality Date  . Adenoidectomy  1956  . Lumbar disc surgery  1996  . Carotid endarterectomy  05/05/2003    right carotid endarterectomy  . Shoulder arthroscopy  01/07/2007    Left shoulder impingement, labral tear  . Cardiac catheterization  10/20/2003    Est. EF of 65% -- Critical disease in the mid to distal circumflex --  Preserved left ventricular  function --   . Coronary angioplasty with stent placement  10/20/2003    Percutaneous coronary intervention/drug-eluding stent implantation, third marginal branch --  Percutaneous closure, right femoral artery -- Atherosclerotic coronary vascular disease, single vessel / Status post successful percutaneous coronary intervention/tandem drug -- eluding stent implantation proximal third marginal branch -- Typical angina was not reproduced with device insertion or balloon   . Hematoma evacuation  05/09/2003    Evacuation of hematoma, right neck  . Coronary artery bypass graft  08/29/2010    CABG X2; SVG to PDA, SVG to OM2- [facility]  . Aortic valve replacement  08/29/2010    25 mm CE Magna bioprosthetic - [facility]  . Mitral valve repair  08/23/2010    28 mm Simulus ring -[facility]  . Tonsillectomy and  adenoidectomy  1956  . Appendectomy  1951  . Cardiac valve replacement    . Posterior fusion lumbar spine  1997  . Cervical disc surgery  1980's  . Anterior cervical decomp/discectomy fusion  2000's  . Skin cancer excision  1990's;  2012    "left chest & right nose" (03/26/2012)    History  Smoking status  . Former Smoker -- 5.00 packs/day for 23 years  . Types: Cigarettes  . Quit date: 10/02/1978  Smokeless tobacco  . Never Used    History  Alcohol Use No    Comment: 03/26/2012 "last alcohol was in 1970; never had problem w/it"    Family History  Problem Relation Age of Onset  . Diabetes Mother   . Coronary artery disease Mother   . Heart disease Mother   . Stroke Father   . Cancer Daughter     Reviw of Systems:  Reviewed in the HPI.  All other systems are negative.  Physical Exam: BP 124/70  Pulse 79  Ht 5\' 6"  (1.676 m)  Wt 170 lb 1.9 oz (77.166 kg)  BMI 27.47 kg/m2 The patient is alert and oriented x 3.  The mood and affect are normal.   Skin: warm and dry.  Color is normal.    HEENT:   Normal carotids, no JVD, right CEA scar, left neck scar from cervical spine surgery.  Lungs: clear   Heart: RR,  He has a 2/6 diastolic murmur c/w AI  .  He has a soft systolic murmur  Abdomen: + BS  Extremities:  No edema  Neuro:  Non focal     ECG:  03/26/2012-normal sinus rhythm at 69. He has no ST or T wave changes.  Assessment / Plan:

## 2012-05-14 NOTE — Discharge Summary (Signed)
Physician Discharge Summary  Patient ID: [name] MRN: [id number] DOB/AGE: 1935/11/16 77 y.o.  Admit date: 05/09/2012 Discharge date: 05/14/2012  Admission Diagnoses: CVA Coronary artery disease Hypertension  Discharge Diagnoses:  Principal Problem:   CVA (cerebral infarction) Active Problems:   CAD (coronary artery disease)   Occlusion and stenosis of carotid artery without mention of cerebral infarction   Essential hypertension, benign   Discharged Condition: good  Hospital Course: The patient was admitted on February 20 when he woke up today of admission with some slurred speech. An MRI in the ER suggested an acute right posterior frontal cortex CVA. His symptoms resolved by the morning. He denied any headache, visual change, focal weakness. Laboratory testing was unremarkable. The MRI was as above. Chest x-ray no active disease. The patient was admitted and placed on Plavix. He was seen in consultation by neurology. He had a carotid ultrasound which showed no significant right-sided internal carotid stenosis and a less than 40% left-sided stenosis. 2-D echocardiogram showed normal left ventricular size and function and diastolic dysfunction grade 1, aortic bioprosthesis present and mild right ventricular dilation. The patient had had an MRA in August of 2013 and this was not repeated. Dr. Pearlean Brownie of neurology recommended outpatient transesophageal echocardiogram and loop recorder to further evaluate his embolic CVA and this will be arranged with the patient's cardiologist, Dr. Elease Hashimoto. The patient was initially started on Plavix but because of the interaction with Prozac he was switched to Aggrenox. The patient's neurologic symptoms completely resolved and did not recur. He had no trouble swallowing. He was discharged in good condition.   Consults: neurology  Significant Diagnostic Studies: labs: CBC WC 11.1, hemoglobin 11.4, platelet count 266, BUN 11 creatinine 0.77 radiology:  CXR: normal and MRI: As noted above and cardiac graphics: Echocardiogram: As noted above  Treatments: IV hydration, anticoagulation: Plavix and therapies: PT and OT  Discharge Exam: Blood pressure 125/67, pulse 94, temperature 97.8 F (36.6 C), temperature source Oral, resp. rate 18, height 5\' 6"  (1.676 m), weight 74.844 kg (165 lb), SpO2 94.00%. Resp: clear to auscultation bilaterally Cardio: regular rate and rhythm, S1, S2 normal, no murmur, click, rub or gallop  Disposition: 01-Home or Self Care   Future Appointments Provider Department Dept Phone   06/03/2012 4:00 PM Hillis Range, MD [facility] Main Office [place]) [phone]       Medication List    STOP taking these medications       aspirin 325 MG tablet      TAKE these medications       amLODipine 5 MG tablet  Commonly known as:  NORVASC  Take by mouth daily. Take 1/2 Tab Daily     aspirin 81 MG chewable tablet  Chew 1 tablet (81 mg total) by mouth daily at 6 PM.     dipyridamole-aspirin 200-25 MG per 12 hr capsule  Commonly known as:  AGGRENOX  Take 1 capsule by mouth 2 (two) times daily.     FLUoxetine 20 MG capsule  Commonly known as:  PROZAC  Take 20 mg by mouth daily.     isosorbide mononitrate 60 MG 24 hr tablet  Commonly known as:  IMDUR  Take 120 mg by mouth daily.     Melatonin 3 MG Tabs  Take 1 tablet by mouth daily.     metFORMIN 1000 MG (MOD) 24 hr tablet  Commonly known as:  GLUMETZA  Take 1 tablet (1,000 mg total) by mouth 2 (two) times daily with a meal.  metoprolol tartrate 25 MG tablet  Commonly known as:  LOPRESSOR  Take 25 mg by mouth 2 (two) times daily.     MULTIVITAMIN PO  Take by mouth.     nitroGLYCERIN 0.4 MG SL tablet  Commonly known as:  NITROSTAT  Place 1 tablet (0.4 mg total) under the tongue every 5 (five) minutes as needed. For chest pain     NYQUIL 60-7.08-16-998 MG/30ML Liqd  Generic drug:  Pseudoeph-Doxylamine-DM-APAP  Take by mouth.     omeprazole  20 MG capsule  Commonly known as:  PRILOSEC  Take 20 mg by mouth daily.     rosuvastatin 10 MG tablet  Commonly known as:  CRESTOR  Take 10 mg by mouth daily.     vitamin C 500 MG tablet  Commonly known as:  ASCORBIC ACID  Take 500 mg by mouth 2 (two) times daily.           Follow-up Information   Follow up with Lillia Mountain, MD In 2 weeks.   Contact information:   [address], [address] Jaynie Crumble [place] [address] [phone]       Signed: Lillia Mountain 05/14/2012, 7:26 AM

## 2012-05-15 ENCOUNTER — Encounter (HOSPITAL_COMMUNITY)
Admission: RE | Disposition: A | Discharge status: Home / Self Care | Payer: Self-pay | Source: Ambulatory Visit | Attending: Cardiovascular Disease

## 2012-05-15 ENCOUNTER — Encounter (HOSPITAL_COMMUNITY): Payer: Self-pay | Admitting: *Deleted

## 2012-05-15 ENCOUNTER — Ambulatory Visit (HOSPITAL_COMMUNITY)
Admission: RE | Admit: 2012-05-15 | Discharge: 2012-05-15 | Disposition: A | Discharge status: Home / Self Care | Payer: Medicare Other | Source: Ambulatory Visit | Attending: Cardiovascular Disease | Admitting: Cardiovascular Disease

## 2012-05-15 DIAGNOSIS — Z85828 Personal history of other malignant neoplasm of skin: Secondary | ICD-10-CM | POA: Diagnosis not present

## 2012-05-15 DIAGNOSIS — E785 Hyperlipidemia, unspecified: Secondary | ICD-10-CM | POA: Diagnosis not present

## 2012-05-15 DIAGNOSIS — Z951 Presence of aortocoronary bypass graft: Secondary | ICD-10-CM | POA: Insufficient documentation

## 2012-05-15 DIAGNOSIS — I2510 Atherosclerotic heart disease of native coronary artery without angina pectoris: Secondary | ICD-10-CM | POA: Insufficient documentation

## 2012-05-15 DIAGNOSIS — I10 Essential (primary) hypertension: Secondary | ICD-10-CM | POA: Insufficient documentation

## 2012-05-15 DIAGNOSIS — I080 Rheumatic disorders of both mitral and aortic valves: Secondary | ICD-10-CM | POA: Diagnosis not present

## 2012-05-15 DIAGNOSIS — Z87442 Personal history of urinary calculi: Secondary | ICD-10-CM | POA: Diagnosis not present

## 2012-05-15 DIAGNOSIS — E119 Type 2 diabetes mellitus without complications: Secondary | ICD-10-CM | POA: Diagnosis not present

## 2012-05-15 DIAGNOSIS — G4733 Obstructive sleep apnea (adult) (pediatric): Secondary | ICD-10-CM | POA: Insufficient documentation

## 2012-05-15 DIAGNOSIS — IMO0002 Reserved for concepts with insufficient information to code with codable children: Secondary | ICD-10-CM | POA: Diagnosis not present

## 2012-05-15 DIAGNOSIS — R42 Dizziness and giddiness: Secondary | ICD-10-CM | POA: Diagnosis not present

## 2012-05-15 DIAGNOSIS — I6789 Other cerebrovascular disease: Secondary | ICD-10-CM

## 2012-05-15 DIAGNOSIS — Z8673 Personal history of transient ischemic attack (TIA), and cerebral infarction without residual deficits: Secondary | ICD-10-CM | POA: Diagnosis not present

## 2012-05-15 HISTORY — PX: TEE WITHOUT CARDIOVERSION: SHX5443

## 2012-05-15 HISTORY — DX: Gastro-esophageal reflux disease without esophagitis: K21.9

## 2012-05-15 HISTORY — DX: Cerebral infarction, unspecified: I63.9

## 2012-05-15 LAB — GLUCOSE, CAPILLARY: Glucose-Capillary: 124 mg/dL — ABNORMAL HIGH (ref 70–99)

## 2012-05-15 SURGERY — ECHOCARDIOGRAM, TRANSESOPHAGEAL
Anesthesia: Moderate Sedation

## 2012-05-15 MED ORDER — BUTAMBEN-TETRACAINE-BENZOCAINE 2-2-14 % EX AERO
INHALATION_SPRAY | CUTANEOUS | Status: DC | PRN
  Administered 2012-05-15: 2 via TOPICAL

## 2012-05-15 MED ORDER — MIDAZOLAM HCL 10 MG/2ML IJ SOLN
INTRAMUSCULAR | Status: DC | PRN
  Administered 2012-05-15 (×2): 2 mg via INTRAVENOUS
  Administered 2012-05-15: 3 mg via INTRAVENOUS

## 2012-05-15 MED ORDER — DIPHENHYDRAMINE HCL 50 MG/ML IJ SOLN
INTRAMUSCULAR | Status: AC
  Filled 2012-05-15: qty 1

## 2012-05-15 MED ORDER — FENTANYL CITRATE 0.05 MG/ML IJ SOLN
INTRAMUSCULAR | Status: DC | PRN
  Administered 2012-05-15: 50 ug via INTRAVENOUS

## 2012-05-15 MED ORDER — MIDAZOLAM HCL 5 MG/ML IJ SOLN
INTRAMUSCULAR | Status: AC
  Filled 2012-05-15: qty 2

## 2012-05-15 MED ORDER — FENTANYL CITRATE 0.05 MG/ML IJ SOLN
INTRAMUSCULAR | Status: AC
  Filled 2012-05-15: qty 2

## 2012-05-15 MED ORDER — SODIUM CHLORIDE 0.9 % IV SOLN
INTRAVENOUS | Status: DC
  Administered 2012-05-15: 14:00:00 via INTRAVENOUS

## 2012-05-15 NOTE — Progress Notes (Signed)
*  PRELIMINARY RESULTS* Echocardiogram Echocardiogram Transesophageal has been performed.  Jeryl Columbia 05/15/2012, 4:18 PM

## 2012-05-15 NOTE — Interval H&P Note (Signed)
History and Physical Interval Note:  05/15/2012 3:03 PM  [name]  has presented today for surgery, with the diagnosis of stroke/looking for pfo  The various methods of treatment have been discussed with the patient and family. After consideration of risks, benefits and other options for treatment, the patient has consented to  Procedure(s): TRANSESOPHAGEAL ECHOCARDIOGRAM (TEE) (N/A) as a surgical intervention .  The patient's history has been reviewed, patient examined, no change in status, stable for surgery.  I have reviewed the patient's chart and labs.  Questions were answered to the patient's satisfaction.     Elyn Aquas.

## 2012-05-15 NOTE — CV Procedure (Signed)
    Transesophageal Echocardiogram Note  Nolan Tuazon [id number] 1936-01-13  Procedure: Transesophageal Echocardiogram Indications: CVA  Procedure Details Consent: Obtained Time Out: Verified patient identification, verified procedure, site/side was marked, verified correct patient position, special equipment/implants available, Radiology Safety Procedures followed,  medications/allergies/relevent history reviewed, required imaging and test results available.  Performed  Medications: Fentanyl: 50 mcg IB Versed: 7 mg IV  Left Ventrical:  Mild LV dysfunction  Mitral Valve: s/p MV repair,  There appears to be a mobile mass at the anterior leaflet - c/w vegetation.  Mild MR  Aortic Valve: s/p AVR. No vegetation  Tricuspid Valve: normal  Pulmonic Valve: mild - mod PI  Left Atrium/ Left atrial appendage: no clear cut evidence of thrombus. pectinate  Atrial septum: no ASD or PFO.  Aorta: mild calcification   Complications: No apparent complications Patient did tolerate procedure well.  Conclusion:  He has a mobile mass on the mitral valve annulus - may be a suture that has been surrounded by  Pannus. It is suspicious for vegetation.  Will get 2 sets of blood cultures today and follow up.  No obvious indication for coumadin at this point.    Vesta Mixer, Montez Hageman., MD, [facility] 05/15/2012, 3:24 PM

## 2012-05-15 NOTE — H&P (Signed)
[name] Date of Birth              1935/04/25 [facility] [address]    [address] [place], [place]  [address] [phone]              Fax  [phone]   Problem List 1. CAD, AVR, MV reapir 2. Dizziness     History of Present Illness:   [name] is a 77 year old gentleman with a history of coronary artery disease. He status post coronary artery bypass grafting, aortic valve replacement, and mitral valve repair. ( August 23, 2010).   He typically  walks on a regular basis. He has not been walking recently because of schedule issues but will be starting soon.   He had an echocardiogram earlier this morning to followup with his aortic valve replacement and mitral valve repair.   Nov. 4, 2013 - he presents today with some mild chest twinges - not similar to his angina.  Also has some dizziness / lightheadedness.  One episode occurred while he was sitting and a second episode occurred yesterday while he was running a leaf blower.   Dec. 16, 2013: He continues to have mild episodes of the room spinning.  The other day he had severe episodes while lying down - symptoms are c/w vertigo. [name] He also has had some episodes of chest twinges. These last for 1-2 seconds. They're not necessarily associated with twisting or turning of his torso. They're not associated with exercise.  He has not been exercising much.    March 26, 2012: He presents today with 2 days of chest discomfort.  The pains are  similar to his angina pain but not exactly the same.  This pain is more on the left side and his previous episodes of angina ( before his stents) were  The pain is a constant ache for the past 2 days.  There is no associated dyspnea.  The pains are not worsened with walking.  He was walking regularly until 2 days ago when the weather turned cold.  He has not tried any NTG.  He rates the pain at 7/10 last night.  The pains seems to ease with hot coffee and hot soup and  currently the pain is 3/10.   We gave his a NTG in the office and the pain completely resolved.    ECG was NSR with no ST or T wave changes.   Feb. 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  His symptoms resolved very quickly .  He did not receive tPA because the time of the onset was not clear.  MRI of the head revealed a new CVA.  He still has some "fuzzyness" in his head but has made significant improvement.  Dr. Pearlean [name] has requested a TEE and implantable loop recorder.    Current Outpatient Prescriptions on File Prior to Visit   Medication  Sig  Dispense  Refill   .  amLODipine (NORVASC) 5 MG tablet  Take by mouth daily. Take 1/2 Tab Daily          .  aspirin 81 MG chewable tablet  Chew 1 tablet (81 mg total) by mouth daily at 6 PM.   30 tablet   0   .  dipyridamole-aspirin (AGGRENOX) 200-25 MG per 12 hr capsule  Take 1 capsule by mouth 2 (two) times daily.   60 capsule   11   .  FLUoxetine (PROZAC) 20 MG capsule  Take 20 mg by mouth daily.         .  isosorbide mononitrate (IMDUR) 60 MG 24 hr tablet  Take 120 mg by mouth daily.         .  Melatonin 3 MG TABS  Take 1 tablet by mouth daily.         .  metFORMIN (GLUMETZA) 1000 MG (MOD) 24 hr tablet  Take 1 tablet (1,000 mg total) by mouth 2 (two) times daily with a meal.         .  metoprolol tartrate (LOPRESSOR) 25 MG tablet  Take 25 mg by mouth 2 (two) times daily.         .  Multiple Vitamin (MULTIVITAMIN PO)  Take by mouth.           .  nitroGLYCERIN (NITROSTAT) 0.4 MG SL tablet  Place 1 tablet (0.4 mg total) under the tongue every 5 (five) minutes as needed. For chest pain   25 tablet   5   .  omeprazole (PRILOSEC) 20 MG capsule  Take 20 mg by mouth daily.           .  Pseudoeph-Doxylamine-DM-APAP (NYQUIL) 60-7.08-16-998 MG/30ML LIQD  Take by mouth.         .  rosuvastatin (CRESTOR) 10 MG tablet  Take 10 mg by mouth daily.           .  vitamin C (ASCORBIC ACID) 500 MG tablet  Take 500 mg by mouth 2 (two) times  daily.               No current facility-administered medications on file prior to visit.        No Known Allergies    Past Medical History   Diagnosis  Date   .  Hyperlipidemia     .  Mitral valve prolapse     .  Aortic valve regurgitation     .  Degenerative disk disease     .  Major depression     .  Spinal stenosis     .  Coronary artery disease         post stents; prior CABG; s/p cath January 2014 with 2 VD, patent SVG to [facility] and patent SVG to PL patent   .  Skin cancer of nose  2012       "right side" (03/26/2012)   .  Skin cancer of trunk         "left chest" (03/26/2012)   .  Hypertension     .  Carotid artery occlusion     .  Heart murmur     .  Anginal pain     .  OSA (obstructive sleep apnea)         "have a mask; haven't used one in years" (03/26/2012)   .  Type II diabetes mellitus     .  History of esophageal stricture         "has it stretched q once in awhile" (03/26/2012"   .  Kidney stones           Past Surgical History   Procedure  Laterality  Date   .  Adenoidectomy    1956   .  Lumbar disc surgery    1996   .  Carotid endarterectomy    05/05/2003       right carotid endarterectomy   .  Shoulder arthroscopy  01/07/2007       Left shoulder impingement, labral tear   .  Cardiac catheterization    10/20/2003       Est. EF of 65% -- Critical disease in the mid to distal circumflex --  Preserved left ventricular  function --    .  Coronary angioplasty with stent placement    10/20/2003       Percutaneous coronary intervention/drug-eluding stent implantation, third marginal branch --  Percutaneous closure, right femoral artery -- Atherosclerotic coronary vascular disease, single vessel / Status post successful percutaneous coronary intervention/tandem drug -- eluding stent implantation proximal third marginal branch -- Typical angina was not reproduced with device insertion or balloon    .  Hematoma evacuation    05/09/2003       Evacuation of hematoma,  right neck   .  Coronary artery bypass graft    08/29/2010       CABG X2; SVG to PDA, SVG to OM2- [facility]   .  Aortic valve replacement    08/29/2010       25 mm CE Magna bioprosthetic - [facility]   .  Mitral valve repair    08/23/2010       28 mm Simulus ring -[facility]   .  Tonsillectomy and adenoidectomy    1956   .  Appendectomy    1951   .  Cardiac valve replacement       .  Posterior fusion lumbar spine    1997   .  Cervical disc surgery    1980's   .  Anterior cervical decomp/discectomy fusion    2000's   .  Skin cancer excision    1990's; 2012       "left chest & right nose" (03/26/2012)         History   Smoking status   .  Former Smoker -- 5.00 packs/day for 23 years   .  Types:  Cigarettes   .  Quit date:  10/02/1978   Smokeless tobacco   .  Never Used         History   Alcohol Use  No       Comment: 03/26/2012 "last alcohol was in 1970; never had problem w/it"         Family History   Problem  Relation  Age of Onset   .  Diabetes  Mother     .  Coronary artery disease  Mother     .  Heart disease  Mother     .  Stroke  Father     .  Cancer  Daughter          Reviw of Systems:   Reviewed in the HPI.  All other systems are negative.   Physical Exam: BP 124/70  Pulse 79  Ht 5\' 6"  (1.676 m)  Wt 170 lb 1.9 oz (77.166 kg)  BMI 27.47 kg/m2 The patient is alert and oriented x 3.  The mood and affect are normal.    Skin: warm and dry.  Color is normal.     HEENT:   Normal carotids, no JVD, right CEA scar, left neck scar from cervical spine surgery.   Lungs: clear    Heart: RR,  He has a 2/6 diastolic murmur c/w AI  .  He has a soft systolic murmur   Abdomen: + BS   Extremities:  No edema   Neuro:  Non focal  ECG:   03/26/2012-normal sinus rhythm at 69. He has no ST or T wave changes.   Assessment / Plan:             CVA (cerebral infarction) - Vesta Mixer, MD at 05/13/2012 10:17 PM    Status: Written Related Problem: CVA  (cerebral infarction)           [name] has had a recent CVA that the stroke team thinks may be due to an embolic source.  We will schedule a TEE to look for source of emboli.  We will also place an implantable loop recorder to look for atrial fib ( assuming that we do not find a PFO or other cause fof CVA)    Vesta Mixer, Montez Hageman., MD, [facility] 05/15/2012, 3:03 PM Office - [phone] Pager [phone]

## 2012-05-16 ENCOUNTER — Encounter (HOSPITAL_COMMUNITY): Payer: Self-pay | Admitting: Cardiovascular Disease

## 2012-05-17 DIAGNOSIS — Z79899 Other long term (current) drug therapy: Secondary | ICD-10-CM | POA: Diagnosis not present

## 2012-05-17 DIAGNOSIS — R9431 Abnormal electrocardiogram [ECG] [EKG]: Secondary | ICD-10-CM | POA: Diagnosis not present

## 2012-05-17 DIAGNOSIS — Z8673 Personal history of transient ischemic attack (TIA), and cerebral infarction without residual deficits: Secondary | ICD-10-CM | POA: Diagnosis not present

## 2012-05-17 DIAGNOSIS — I2510 Atherosclerotic heart disease of native coronary artery without angina pectoris: Secondary | ICD-10-CM | POA: Diagnosis not present

## 2012-05-17 DIAGNOSIS — R0789 Other chest pain: Secondary | ICD-10-CM | POA: Diagnosis not present

## 2012-05-17 DIAGNOSIS — Z951 Presence of aortocoronary bypass graft: Secondary | ICD-10-CM | POA: Diagnosis not present

## 2012-05-17 DIAGNOSIS — I6350 Cerebral infarction due to unspecified occlusion or stenosis of unspecified cerebral artery: Secondary | ICD-10-CM | POA: Diagnosis not present

## 2012-05-18 DIAGNOSIS — I639 Cerebral infarction, unspecified: Secondary | ICD-10-CM | POA: Insufficient documentation

## 2012-05-18 DIAGNOSIS — Z9889 Other specified postprocedural states: Secondary | ICD-10-CM | POA: Insufficient documentation

## 2012-05-18 DIAGNOSIS — Z953 Presence of xenogenic heart valve: Secondary | ICD-10-CM | POA: Insufficient documentation

## 2012-05-18 DIAGNOSIS — I2510 Atherosclerotic heart disease of native coronary artery without angina pectoris: Secondary | ICD-10-CM | POA: Insufficient documentation

## 2012-05-21 ENCOUNTER — Telehealth: Payer: Self-pay | Admitting: Cardiovascular Disease

## 2012-05-21 LAB — CULTURE, BLOOD (ROUTINE X 2): Culture: NO GROWTH

## 2012-05-21 NOTE — Telephone Encounter (Signed)
Records Were Faxed to [facility]/Dr.Keifer @ [phone]  Was Signed By Pt, I have also been asked to Send Cardiac Cath & Echo Both On CD's By Fed-Ex To [facility] Once Both of these have Been Received I will Forward to Address Below Dr.Todd [facility] Attn:Sue [address] [place] [address] 05/21/12/KM

## 2012-05-22 ENCOUNTER — Other Ambulatory Visit: Payer: Medicare Other

## 2012-05-22 ENCOUNTER — Telehealth: Payer: Self-pay | Admitting: *Deleted

## 2012-05-22 ENCOUNTER — Encounter: Payer: Self-pay | Admitting: Cardiovascular Disease

## 2012-05-22 NOTE — Telephone Encounter (Signed)
Called patient informing him of recent blood culture result. Patient now requesting followup from MD regarding next steps and discussion of other possible diagnoses.

## 2012-05-23 NOTE — Telephone Encounter (Signed)
lmtcb

## 2012-05-23 NOTE — Telephone Encounter (Signed)
The blood culture results are negative.  This rules out a current infection ( endocarditis).  This small mass that we saw may have been there since his mitral valve repair.  We can repeat the TEE in 6 months.  I do not have any further suggestions.  It is possible that another small mass may have been present and broke off and caused his stroke.   I think the possibility of this is only slight.  He should contact Dr. Pearlean Brownie again for follow up.

## 2012-05-27 ENCOUNTER — Telehealth: Payer: Self-pay | Admitting: Cardiovascular Disease

## 2012-05-27 NOTE — Telephone Encounter (Signed)
lmtcb

## 2012-05-27 NOTE — Telephone Encounter (Signed)
Pt wants to know what he can do for the sharp pain he has in his left heart, happens 15-20 times a day, lasting seconds, starts left heart and shoots across, started 4 months ago, pt concerned. States he takes the imdur daily but pain continues. Please advise.

## 2012-05-27 NOTE — Telephone Encounter (Signed)
See original note that was open.

## 2012-05-27 NOTE — Telephone Encounter (Signed)
Pt rtn call to nurse

## 2012-05-28 DIAGNOSIS — I6990 Unspecified sequelae of unspecified cerebrovascular disease: Secondary | ICD-10-CM | POA: Diagnosis not present

## 2012-05-28 DIAGNOSIS — R05 Cough: Secondary | ICD-10-CM | POA: Diagnosis not present

## 2012-05-28 DIAGNOSIS — R071 Chest pain on breathing: Secondary | ICD-10-CM | POA: Diagnosis not present

## 2012-05-28 DIAGNOSIS — Z7901 Long term (current) use of anticoagulants: Secondary | ICD-10-CM | POA: Diagnosis not present

## 2012-05-28 NOTE — Telephone Encounter (Signed)
PT NOT AT HOME, WILL TRY LATER DATE.

## 2012-05-28 NOTE — Telephone Encounter (Signed)
He should probably see his medical doctor - these do not sound cardiac.  He just had a cath recently.

## 2012-05-29 ENCOUNTER — Telehealth: Payer: Self-pay | Admitting: Cardiovascular Disease

## 2012-05-29 NOTE — Telephone Encounter (Signed)
Cath CD & Echo CD Mailed Out Today To  [id number] [address] Dr.Todd Ky Barban  05/29/12/KM

## 2012-05-30 DIAGNOSIS — Z7901 Long term (current) use of anticoagulants: Secondary | ICD-10-CM | POA: Diagnosis not present

## 2012-05-30 NOTE — Telephone Encounter (Signed)
Will route to Dr Elease Hashimoto, he will call pt.

## 2012-05-30 NOTE — Telephone Encounter (Signed)
I called [name],  I do not think his current smptoms ( cough, cp lasting 2-3 seconds) are cardiac.  His medical doctor agrees.  He may need to see a pulmonologist.

## 2012-06-03 ENCOUNTER — Institutional Professional Consult (permissible substitution): Payer: Medicare Other | Admitting: Internal Medicine

## 2012-06-03 DIAGNOSIS — Z7901 Long term (current) use of anticoagulants: Secondary | ICD-10-CM | POA: Diagnosis not present

## 2012-06-07 DIAGNOSIS — Z7901 Long term (current) use of anticoagulants: Secondary | ICD-10-CM | POA: Diagnosis not present

## 2012-06-14 DIAGNOSIS — Z7901 Long term (current) use of anticoagulants: Secondary | ICD-10-CM | POA: Diagnosis not present

## 2012-06-24 DIAGNOSIS — E119 Type 2 diabetes mellitus without complications: Secondary | ICD-10-CM | POA: Diagnosis not present

## 2012-06-24 DIAGNOSIS — Z7901 Long term (current) use of anticoagulants: Secondary | ICD-10-CM | POA: Diagnosis not present

## 2012-06-24 DIAGNOSIS — R319 Hematuria, unspecified: Secondary | ICD-10-CM | POA: Diagnosis not present

## 2012-06-24 DIAGNOSIS — I10 Essential (primary) hypertension: Secondary | ICD-10-CM | POA: Diagnosis not present

## 2012-06-27 DIAGNOSIS — L253 Unspecified contact dermatitis due to other chemical products: Secondary | ICD-10-CM | POA: Diagnosis not present

## 2012-06-27 DIAGNOSIS — C44519 Basal cell carcinoma of skin of other part of trunk: Secondary | ICD-10-CM | POA: Diagnosis not present

## 2012-06-27 DIAGNOSIS — Z7901 Long term (current) use of anticoagulants: Secondary | ICD-10-CM | POA: Diagnosis not present

## 2012-06-27 DIAGNOSIS — D1801 Hemangioma of skin and subcutaneous tissue: Secondary | ICD-10-CM | POA: Diagnosis not present

## 2012-06-27 DIAGNOSIS — D239 Other benign neoplasm of skin, unspecified: Secondary | ICD-10-CM | POA: Diagnosis not present

## 2012-06-27 DIAGNOSIS — D485 Neoplasm of uncertain behavior of skin: Secondary | ICD-10-CM | POA: Diagnosis not present

## 2012-07-01 DIAGNOSIS — I2510 Atherosclerotic heart disease of native coronary artery without angina pectoris: Secondary | ICD-10-CM | POA: Diagnosis not present

## 2012-07-01 DIAGNOSIS — I6350 Cerebral infarction due to unspecified occlusion or stenosis of unspecified cerebral artery: Secondary | ICD-10-CM | POA: Diagnosis not present

## 2012-07-01 DIAGNOSIS — Z9889 Other specified postprocedural states: Secondary | ICD-10-CM | POA: Diagnosis not present

## 2012-07-01 DIAGNOSIS — Z952 Presence of prosthetic heart valve: Secondary | ICD-10-CM | POA: Diagnosis not present

## 2012-07-03 DIAGNOSIS — Z7901 Long term (current) use of anticoagulants: Secondary | ICD-10-CM | POA: Diagnosis not present

## 2012-07-11 DIAGNOSIS — Z7901 Long term (current) use of anticoagulants: Secondary | ICD-10-CM | POA: Diagnosis not present

## 2012-07-22 ENCOUNTER — Ambulatory Visit (INDEPENDENT_AMBULATORY_CARE_PROVIDER_SITE_OTHER): Payer: Medicare Other | Admitting: Sports Medicine

## 2012-07-22 VITALS — BP 116/64 | Ht 66.0 in | Wt 165.0 lb

## 2012-07-22 DIAGNOSIS — S8000XA Contusion of unspecified knee, initial encounter: Secondary | ICD-10-CM

## 2012-07-22 DIAGNOSIS — S8001XA Contusion of right knee, initial encounter: Secondary | ICD-10-CM

## 2012-07-23 NOTE — Progress Notes (Signed)
  Subjective:    Patient ID: [name], male    DOB: 08-28-1935, 77 y.o.   MRN: [id number]  HPI chief complaint: Right knee pain  Very pleasant 77 year old male comes in today complaining of right knee pain. 8 days ago he fell directly over under the right patella. He had immediate swelling over the patella which has improved. Overall his pain has improved dramatically but his wife wanted him to get his knee checked out. The swelling has resolved. He still has some "soreness" in the anterior knee. He has been able to walk with only a minimal limp. He denies pain elsewhere in the leg. He had initially made this appointment to discuss right shoulder pain. He was a patient of mine at [facility] and I had treated him for a right shoulder issue previously with a cortisone injection. This injection helped him tremendously. However, today his shoulder is relatively asymptomatic. He denies any recent trauma to the shoulder.  Past medical history is reviewed. History is significant for coronary artery disease and a recent CVA. Also a history of diabetes. Medications are reviewed No known drug allergies Socially he is a former smoker having quit 34 years ago. Denies alcohol use. Works as an Pensions consultant     Review of Systems     Objective:   Physical Exam Well-developed, well-nourished. No acute distress. Awake alert and oriented x3. Vital signs are reviewed  Right knee: Full range of motion. No effusion. No prepatellar swelling. There is a healing abrasion over the anterior knee without any evidence of infection. There is mild tenderness to palpation diffusely around the patella but not marked. Extensor mechanism is intact. Knee is grossly stable to ligamentous exam. Neurovascularly intact distally. Walking with a slight limp.       Assessment & Plan:  1. Improving right knee pain secondary to resolving posttraumatic prepatellar bursitis and patellar contusion 2. History of  right shoulder pain, likely secondary to rotator cuff tendinopathy  Patient states his Knee is feeling "much much better" since his injury 8 days ago. I would like to see the patient back in the office in 3 weeks. At this point in time I do not think we need any radiographs but if his symptoms do not continue to improve we may need to consider x-rays of his knee to rule out a small patellar fracture. I will request the records from [facility]. If his right shoulder pain returns we can always consider a repeat cortisone injection. He will let me know if his knee pain does not continue to improve in the interim. He may continue with activity as tolerated.

## 2012-07-25 DIAGNOSIS — Z7901 Long term (current) use of anticoagulants: Secondary | ICD-10-CM | POA: Diagnosis not present

## 2012-08-08 ENCOUNTER — Ambulatory Visit (INDEPENDENT_AMBULATORY_CARE_PROVIDER_SITE_OTHER): Payer: Medicare Other | Admitting: Neurology

## 2012-08-08 VITALS — BP 122/67 | HR 74 | Wt 175.0 lb

## 2012-08-08 DIAGNOSIS — I6350 Cerebral infarction due to unspecified occlusion or stenosis of unspecified cerebral artery: Secondary | ICD-10-CM | POA: Diagnosis not present

## 2012-08-08 NOTE — Patient Instructions (Signed)
Follow up appt in 3 months.  Please call with information from Cardiologist.  STROKE/TIA INSTRUCTIONS SMOKING Cigarette smoking nearly doubles your risk of having a stroke & is the single most alterable risk factor  If you smoke or have smoked in the last 12 months, you are advised to quit smoking for your health.  Most of the excess cardiovascular risk related to smoking disappears within a year of stopping.  Ask you doctor about anti-smoking medications  [place] Quit Line: 1-800-QUIT NOW  Free Smoking Cessation Classes [phone]  CHOLESTEROL Know your levels; limit fat & cholesterol in your diet  Lab Results  Component Value Date   CHOL 117 05/10/2012   HDL 40 05/10/2012   LDLCALC 54 05/10/2012   TRIG 114 05/10/2012   CHOLHDL 2.9 05/10/2012      Many patients benefit from treatment even if their cholesterol is at goal.  Goal: Total Cholesterol less than 160  Goal:  LDL less than 100  Goal:  HDL greater than 40  Goal:  Triglycerides less than 150  BLOOD PRESSURE American Stroke Association blood pressure target is less that 120/80 mm/Hg  Your discharge blood pressure is:  BP: 122/67 mmHg  Monitor your blood pressure  Limit your salt and alcohol intake  Many individuals will require more than one medication for high blood pressure  DIABETES (A1c is a blood sugar average for last 3 months) Goal A1c is under 7% (A1c is blood sugar average for last 3 months)  Diabetes: Diagnosis of diabetes:  A1c was not drawn this admission    Lab Results  Component Value Date   HGBA1C 7.9* 05/10/2012    Your A1c can be lowered with medications, healthy diet, and exercise.  Check your blood sugar as directed by your physician  Call your physician if you experience unexplained or low blood sugars.  PHYSICAL ACTIVITY/REHABILITATION Goal is 30 minutes at least 4 days per week    Activity decreases your risk of heart attack and stroke and makes your heart stronger.  It helps control your  weight and blood pressure; helps you relax and can improve your mood.  Participate in a regular exercise program.  Talk with your doctor about the best form of exercise for you (dancing, walking, swimming, cycling).  DIET/WEIGHT Goal is to maintain a healthy weight  Your height is:    Your current weight is: Weight: 175 lb (79.379 kg) Your body Mass Index (BMI) is:     Following the type of diet specifically designed for you will help prevent another stroke.  Your goal Body Mass Index (BMI) is 19-24.  Healthy food habits can help reduce 3 risk factors for stroke:  High cholesterol, hypertension, and excess weight.

## 2012-08-08 NOTE — Progress Notes (Signed)
[facility]  PATIENT: [name] DOB: [birth year]   HISTORY FROM: patient and wife, chart REASON FOR VISIT: stroke 2 month follow up  HISTORY OF PRESENT ILLNESS:  [name] is a 77 year old gentleman who woke up on 05/09/2012 with some slurred speech. MRI MER suggested an acute right posterior frontal cortex CVA. His symptoms resolved by the morning. He denied any headache, visual change, focal weakness. He had a carotid ultrasound which showed no significant right-sided internal carotid stenosis and less than 40% left-sided stenosis. 2-D echo showed normal left ventricular size and function and diastolic dysfunction grade 1, aortic bioprosthesis present and mild right ventricular dilation. The patient had MRA in August of 2013 and this was not repeated. The patient was originally started on Plavix but because of the interaction with Prozac he was switched to Aggrenox. The patient's neurologic symptoms completely resolved and did not recur. He had no trouble swallowing. He was discharged in good condition.  Update 08/08/2012: Patient comes in today for followup of CVA on 05/09/2012. He reports that he is doing well. His wife states that she notices slight slurred speech when he is tired. He reports no other deficits. Patient states that he was switched from Aggrenox to Coumadin by his [facility] cardiologist. He is not able to provide cardiologist name or phone number. Outpatient cardiac monitoring had not been completed, Atrial fibrillation had not been documented. Patient denies medication side effects, with no signs of bleeding.    REVIEW OF SYSTEMS: Full 14 system review of systems performed and notable only for respiratory: cough, genitourinary: impotence neurological: Memory loss, slurred speech sleep: Insomnia, snoring   ALLERGIES: No Known Allergies  HOME MEDICATIONS: Outpatient Prescriptions Prior to Visit  Medication Sig Dispense Refill  . amLODipine  (NORVASC) 5 MG tablet Take by mouth daily. Take 1/2 Tab Daily       . aspirin 81 MG chewable tablet Chew 1 tablet (81 mg total) by mouth daily at 6 PM.  30 tablet  0  . FLUoxetine (PROZAC) 20 MG capsule Take 20 mg by mouth daily.      . isosorbide mononitrate (IMDUR) 60 MG 24 hr tablet Take 120 mg by mouth daily.      . Melatonin 3 MG TABS Take 1 tablet by mouth daily.      . metFORMIN (GLUMETZA) 1000 MG (MOD) 24 hr tablet Take 1 tablet (1,000 mg total) by mouth 2 (two) times daily with a meal.      . metoprolol tartrate (LOPRESSOR) 25 MG tablet Take 25 mg by mouth 2 (two) times daily.      . Multiple Vitamin (MULTIVITAMIN PO) Take by mouth.        . nitroGLYCERIN (NITROSTAT) 0.4 MG SL tablet Place 1 tablet (0.4 mg total) under the tongue every 5 (five) minutes as needed. For chest pain  25 tablet  5  . omeprazole (PRILOSEC) 20 MG capsule Take 20 mg by mouth daily.        . rosuvastatin (CRESTOR) 10 MG tablet Take 10 mg by mouth daily.        . vitamin C (ASCORBIC ACID) 500 MG tablet Take 500 mg by mouth 2 (two) times daily.        [name] dipyridamole-aspirin (AGGRENOX) 200-25 MG per 12 hr capsule Take 1 capsule by mouth 2 (two) times daily.  60 capsule  11  . Pseudoeph-Doxylamine-DM-APAP (NYQUIL) 60-7.08-16-998 MG/30ML LIQD Take by mouth.       No facility-administered medications prior to  visit.    PAST MEDICAL HISTORY: Past Medical History  Diagnosis Date  . Hyperlipidemia   . Mitral valve prolapse   . Aortic valve regurgitation   . Degenerative disk disease   . Major depression   . Spinal stenosis   . Coronary artery disease     post stents; prior CABG; s/p cath January 2014 with 2 VD, patent SVG to [facility] and patent SVG to PL patent  . Hypertension   . Carotid artery occlusion   . Heart murmur   . Anginal pain   . History of esophageal stricture     "has it stretched q once in awhile" (03/26/2012"  . OSA (obstructive sleep apnea)     "have a mask; haven't used one in years" (03/26/2012)   . Type II diabetes mellitus   . Stroke 2014  . Kidney stones   . Skin cancer of nose 2012    "right side" (03/26/2012)  . Skin cancer of trunk     "left chest" (03/26/2012)  . GERD (gastroesophageal reflux disease)     PAST SURGICAL HISTORY: Past Surgical History  Procedure Laterality Date  . Adenoidectomy  1956  . Lumbar disc surgery  1996  . Carotid endarterectomy  05/05/2003    right carotid endarterectomy  . Shoulder arthroscopy  01/07/2007    Left shoulder impingement, labral tear  . Cardiac catheterization  10/20/2003    Est. EF of 65% -- Critical disease in the mid to distal circumflex --  Preserved left ventricular  function --   . Coronary angioplasty with stent placement  10/20/2003    Percutaneous coronary intervention/drug-eluding stent implantation, third marginal branch --  Percutaneous closure, right femoral artery -- Atherosclerotic coronary vascular disease, single vessel / Status post successful percutaneous coronary intervention/tandem drug -- eluding stent implantation proximal third marginal branch -- Typical angina was not reproduced with device insertion or balloon   . Hematoma evacuation  05/09/2003    Evacuation of hematoma, right neck  . Coronary artery bypass graft  08/29/2010    CABG X2; SVG to PDA, SVG to OM2- [facility]  . Aortic valve replacement  08/29/2010    25 mm CE Magna bioprosthetic - [facility]  . Mitral valve repair  08/23/2010    28 mm Simulus ring -[facility]  . Tonsillectomy and adenoidectomy  1956  . Appendectomy  1951  . Posterior fusion lumbar spine  1997  . Cervical disc surgery  1980's  . Anterior cervical decomp/discectomy fusion  2000's  . Skin cancer excision  1990's; 2012    "left chest & right nose" (03/26/2012)  . Cardiac valve replacement    . Tee without cardioversion N/A 05/15/2012    Procedure: TRANSESOPHAGEAL ECHOCARDIOGRAM (TEE);  Surgeon: Vesta Mixer, MD;  Location: [facility] ENDOSCOPY;  Service: Cardiovascular;  Laterality: N/A;     FAMILY HISTORY: Family History  Problem Relation Age of Onset  . Diabetes Mother   . Coronary artery disease Mother   . Heart disease Mother   . Stroke Father   . Cancer Daughter     SOCIAL HISTORY: History   Social History  . Marital Status: Married    Spouse Name: N/A    Number of Children: N/A  . Years of Education: N/A   Occupational History  . Not on file.   Social History Main Topics  . Smoking status: Former Smoker -- 5.00 packs/day for 23 years    Types: Cigarettes    Quit date: 10/02/1978  . Smokeless tobacco:  Never Used  . Alcohol Use: No     Comment: 03/26/2012 "last alcohol was in 1970; never had problem w/it"  . Drug Use: No  . Sexually Active: No   Other Topics Concern  . Not on file   Social History Narrative  . No narrative on file     PHYSICAL EXAM  Filed Vitals:   08/08/12 1545  BP: 122/67  Pulse: 74  Weight: 175 lb (79.379 kg)   Body mass index is 28.26 kg/(m^2).  GENERAL EXAM: Patient is in no distress, well developed and well groomed. HEAD: Symmetric facial features. EARS, NOSE, and THROAT: Normal.  NECK: Supple, no JVD RESPIRATORY: Lungs CTA. CARDIOVASCULAR:  Regular rate and rhythm, no murmurs, no carotid bruits SKIN: No rash, no bruising  NEUROLOGIC: MENTAL STATUS: awake, alert and oriented to person, place and time, language fluent, comprehension intact CRANIAL NERVE: pupils equal and reactive to light, visual fields full to confrontation, extraocular muscles intact, no nystagmus, facial sensation and strength symmetric, uvula midline, shoulder shrug symmetric, tongue midline.MILD LEFT LOWER FACIAL DROOP. MOTOR: normal bulk and tone, full strength in the BUE, BLE, No drift. Orbits right over left upper extremity. DECREASED FINE MOTOR ON LEFT. SENSORY: normal and symmetric to light touch, vibration COORDINATION: finger-nose-finger normal REFLEXES: deep tendon reflexes present and symmetric 2+ GAIT/STATION: narrow based gait;  able to walk on toes, heels and tandem; romberg is negative. No assistive device.  DIAGNOSTIC DATA (LABS, IMAGING, TESTING) - I reviewed patient records, labs, notes, testing and imaging myself where available.  Lab Results  Component Value Date   WBC 11.1* 05/10/2012   HGB 11.4* 05/10/2012   HCT 32.5* 05/10/2012   MCV 85.3 05/10/2012   PLT 266 05/10/2012      Component Value Date/Time   NA 140 05/10/2012 0229   K 4.3 05/10/2012 0229   CL 104 05/10/2012 0229   CO2 26 05/10/2012 0229   GLUCOSE 143* 05/10/2012 0229   BUN 11 05/10/2012 0229   CREATININE 0.77 05/10/2012 0229   CALCIUM 9.5 05/10/2012 0229   PROT 7.0 05/09/2012 0720   ALBUMIN 3.7 05/09/2012 0720   AST 17 05/09/2012 0720   ALT 14 05/09/2012 0720   ALKPHOS 65 05/09/2012 0720   BILITOT 0.5 05/09/2012 0720   GFRNONAA 87* 05/10/2012 0229   GFRAA >90 05/10/2012 0229   Lab Results  Component Value Date   CHOL 117 05/10/2012   HDL 40 05/10/2012   LDLCALC 54 05/10/2012   TRIG 114 05/10/2012   CHOLHDL 2.9 05/10/2012   Lab Results  Component Value Date   HGBA1C 7.9* 05/10/2012   No results found for this basename: [id number]   Lab Results  Component Value Date   TSH 2.847 03/26/2012   MRI of the brain 05/09/12 acute infarct right posterior frontal cortex over the convexity. Mild chronic microvascular ischemic changes in the white matter. Chronic microhemorrhages throughout the brain. Unchanged from prior study.  Carotid Doppler right ICA: No stenosis. Left ICA: Less than 40% stenosis. Bilateral vertebrals: Antegrade flow.  ASSESSMENT AND PLAN Mr. Nixon is a 77 year old male here for follow up of right posterior frontal cortex infarct on 05/09/2012. Infarct felt to be embolic secondary to unknown etiology. He has residual mild dysarthria when tired.  He was placed on Coumadin by his [facility] cardiologist for unknown reason as to our knowledge there is no documented atrial fibrillation. We have asked patient to call the office with the name  and phone number for the his [facility] cardiologist  so that Dr. Pearlean Brownie may discuss it with him.  Hyperlipidemia, LDL 54, on statin at goal LDL less than 100. CAD,hx CABG with stents Hypertension at goal less than 130/80 Diabetes, on metformin, not at goal hemoglobin A1c less than 6.5 (most current 7.9)  Plan:  Continue warfarin  for secondary stroke prevention but will need to discuss with cardiologist rationale as work up in hospital had not shown any clear cardiac source of embolism .Maintain strict control of hypertension with blood pressure goal below 130/90, diabetes with hemoglobin A1c goal below 6.5% and lipids with LDL cholesterol goal below 100 mg/dL.I am not sure why he is on warfarin Followup in the future with me in 3 months.  Peyton Spengler NP-C 08/08/2012, 4:27 PM  [facility] [address] [place], [place] [address] [phone]  I have personally examined this patient, reviewed pertinent data, developed plan of care and discussed with patient and agree with above.  Delia Heady, MD

## 2012-08-09 ENCOUNTER — Encounter: Payer: Self-pay | Admitting: Neurology

## 2012-08-15 DIAGNOSIS — Z7901 Long term (current) use of anticoagulants: Secondary | ICD-10-CM | POA: Diagnosis not present

## 2012-08-19 ENCOUNTER — Ambulatory Visit (HOSPITAL_BASED_OUTPATIENT_CLINIC_OR_DEPARTMENT_OTHER): Payer: Medicare Other | Admitting: Sports Medicine

## 2012-08-19 VITALS — BP 120/60 | Ht 66.0 in | Wt 170.0 lb

## 2012-08-19 DIAGNOSIS — S8000XA Contusion of unspecified knee, initial encounter: Secondary | ICD-10-CM

## 2012-08-19 DIAGNOSIS — S8001XA Contusion of right knee, initial encounter: Secondary | ICD-10-CM

## 2012-08-19 DIAGNOSIS — M7530 Calcific tendinitis of unspecified shoulder: Secondary | ICD-10-CM | POA: Diagnosis not present

## 2012-08-19 DIAGNOSIS — M7531 Calcific tendinitis of right shoulder: Secondary | ICD-10-CM

## 2012-08-19 NOTE — Progress Notes (Signed)
  Subjective:    Patient ID: [name], male    DOB: 08/23/1935, 77 y.o.   MRN: [id number]  HPI Patient comes in today for followup. Right knee pain is much improved. Some mild soreness around the patella but not bad. He is able to walk without a limp. Right shoulder remains symptomatic but mainly at night. He is able to carry out his daily activities without too much problem. An x-ray in 2009 of his right shoulder showed calcific tendinitis. He responded well to a subacromial cortisone injection then. He localizes all of his pain to the lateral shoulder. No neck pain. No associated numbness or tingling. Overall, he feels like his pain is tolerable.    Review of Systems     Objective:   Physical Exam Well-developed, well-nourished. No acute distress. Awake alert and oriented x3  Right shoulder: Full range of motion with a positive painful ARC. No tenderness to palpation along the clavicle or over the a.c. joint. No tenderness over the bicipital groove. Rotator cuff strength is 5/5 but reproducible of pain with resisted supraspinatus. Positive empty can and positive Hawkins. No significant glenohumeral crepitus. Neurovascularly intact distally.  Right knee: Full range of motion. No effusion. No soft tissue swelling. Mild tenderness to palpation directly over the patella. His skin abrasion is well healed without signs of infection. Good joint stability. Negative McMurray's. Neurovascularly intact distally. Walking without a limp.       Assessment & Plan:  1. Chronic right shoulder pain secondary to calcific rotator cuff tendinopathy 2. Improved right knee pain secondary to patellar contusion  Patient's right shoulder pain is currently tolerable. We discussed the possibility of a repeat subacromial cortisone injection if his symptoms warrant. He may continue to increase activity as tolerated and will followup with me when necessary.

## 2012-09-09 DIAGNOSIS — Z7901 Long term (current) use of anticoagulants: Secondary | ICD-10-CM | POA: Diagnosis not present

## 2012-10-07 DIAGNOSIS — Z7901 Long term (current) use of anticoagulants: Secondary | ICD-10-CM | POA: Diagnosis not present

## 2012-10-23 ENCOUNTER — Other Ambulatory Visit: Payer: Self-pay

## 2012-11-01 DIAGNOSIS — I10 Essential (primary) hypertension: Secondary | ICD-10-CM | POA: Diagnosis not present

## 2012-11-01 DIAGNOSIS — E119 Type 2 diabetes mellitus without complications: Secondary | ICD-10-CM | POA: Diagnosis not present

## 2012-11-01 DIAGNOSIS — N400 Enlarged prostate without lower urinary tract symptoms: Secondary | ICD-10-CM | POA: Diagnosis not present

## 2012-11-08 DIAGNOSIS — Z7901 Long term (current) use of anticoagulants: Secondary | ICD-10-CM | POA: Diagnosis not present

## 2012-11-27 ENCOUNTER — Encounter: Payer: Self-pay | Admitting: Neurology

## 2012-11-27 ENCOUNTER — Ambulatory Visit (INDEPENDENT_AMBULATORY_CARE_PROVIDER_SITE_OTHER): Payer: Medicare Other | Admitting: Neurology

## 2012-11-27 VITALS — BP 115/65 | HR 80 | Temp 98.4°F | Ht 66.0 in | Wt 171.0 lb

## 2012-11-27 DIAGNOSIS — I6789 Other cerebrovascular disease: Secondary | ICD-10-CM | POA: Diagnosis not present

## 2012-11-27 NOTE — Patient Instructions (Signed)
Tinea warfarin for now but will discuss with his cardiologist at [facility] Dr. Oris Drone whether patient has documented atrial fibrillation to justify long-term warfarin use. Strict control of hypertension with blood pressure goal below 130/90 and lipids with LDL cholesterol goal below 100 mg percent.

## 2012-11-27 NOTE — Progress Notes (Signed)
[facility]  PATIENT: [name] DOB: 1935/12/01   HISTORY FROM: patient and wife, chart REASON FOR VISIT: stroke 2 month follow up  HISTORY OF PRESENT ILLNESS:  [name] is a 77 year old gentleman who woke up on 05/09/2012 with some slurred speech. MRI MER suggested an acute right posterior frontal cortex CVA. His symptoms resolved by the morning. He denied any headache, visual change, focal weakness. He had a carotid ultrasound which showed no significant right-sided internal carotid stenosis and less than 40% left-sided stenosis. 2-D echo showed normal left ventricular size and function and diastolic dysfunction grade 1, aortic bioprosthesis present and mild right ventricular dilation. The patient had MRA in August of 2013 and this was not repeated. The patient was originally started on Plavix but because of the interaction with Prozac he was switched to Aggrenox. The patient's neurologic symptoms completely resolved and did not recur. He had no trouble swallowing. He was discharged in good condition.  Update 08/08/2012: Patient comes in today for followup of CVA on 05/09/2012. He reports that he is doing well. His wife states that she notices slight slurred speech when he is tired. He reports no other deficits. Patient states that he was switched from Aggrenox to Coumadin by his [facility] cardiologist. He is not able to provide cardiologist name or phone number. Outpatient cardiac monitoring had not been completed, Atrial fibrillation had not been documented. Patient denies medication side effects, with no signs of bleeding.   Update 11/27/2012 he returns for f/u today and states he is doing well without any recurrent stroke or TIA symptoms.He is tolerating warfarin well with stable INR and no bleeding or other complications.He states blood pressure is stable and is 115/65 and last lipid profile checked by Dr Valentina Lucks was fine.he has no new complaints today  REVIEW OF  SYSTEMS: Full 14 system review of systems performed and notable only for respiratory: cough, genitourinary: impotence neurological: Memory loss, slurred speech sleep: Insomnia, snoring   ALLERGIES: No Known Allergies  HOME MEDICATIONS: Outpatient Prescriptions Prior to Visit  Medication Sig Dispense Refill  . amLODipine (NORVASC) 5 MG tablet Take by mouth daily. Take 1/2 Tab Daily       . aspirin 81 MG chewable tablet Chew 1 tablet (81 mg total) by mouth daily at 6 PM.  30 tablet  0  . FLUoxetine (PROZAC) 20 MG capsule Take 20 mg by mouth daily.      . isosorbide mononitrate (IMDUR) 60 MG 24 hr tablet Take 120 mg by mouth daily.      . Melatonin 3 MG TABS Take 1 tablet by mouth daily.      . metFORMIN (GLUMETZA) 1000 MG (MOD) 24 hr tablet Take 1 tablet (1,000 mg total) by mouth 2 (two) times daily with a meal.      . metoprolol tartrate (LOPRESSOR) 25 MG tablet Take 25 mg by mouth 2 (two) times daily.      . Multiple Vitamin (MULTIVITAMIN PO) Take by mouth.        . nitroGLYCERIN (NITROSTAT) 0.4 MG SL tablet Place 1 tablet (0.4 mg total) under the tongue every 5 (five) minutes as needed. For chest pain  25 tablet  5  . omeprazole (PRILOSEC) 20 MG capsule Take 20 mg by mouth daily.        . rosuvastatin (CRESTOR) 10 MG tablet Take 10 mg by mouth daily.        . vitamin C (ASCORBIC ACID) 500 MG tablet Take 500 mg by  mouth 2 (two) times daily.        [name] warfarin (COUMADIN) 5 MG tablet Take 5 mg by mouth every Wednesday and Saturday.      . warfarin (COUMADIN) 7.5 MG tablet Take 7.5 mg by mouth daily. Sun, Mon, Tues, Thurs, Fri       No facility-administered medications prior to visit.    PAST MEDICAL HISTORY: Past Medical History  Diagnosis Date  . Hyperlipidemia   . Mitral valve prolapse   . Aortic valve regurgitation   . Degenerative disk disease   . Major depression   . Spinal stenosis   . Coronary artery disease     post stents; prior CABG; s/p cath January 2014 with 2 VD, patent  SVG to [facility] and patent SVG to PL patent  . Hypertension   . Carotid artery occlusion   . Heart murmur   . Anginal pain   . History of esophageal stricture     "has it stretched q once in awhile" (03/26/2012"  . OSA (obstructive sleep apnea)     "have a mask; haven't used one in years" (03/26/2012)  . Type II diabetes mellitus   . Stroke 2014  . Kidney stones   . Skin cancer of nose 2012    "right side" (03/26/2012)  . Skin cancer of trunk     "left chest" (03/26/2012)  . GERD (gastroesophageal reflux disease)     PAST SURGICAL HISTORY: Past Surgical History  Procedure Laterality Date  . Adenoidectomy  1956  . Lumbar disc surgery  1996  . Carotid endarterectomy  05/05/2003    right carotid endarterectomy  . Shoulder arthroscopy  01/07/2007    Left shoulder impingement, labral tear  . Cardiac catheterization  10/20/2003    Est. EF of 65% -- Critical disease in the mid to distal circumflex --  Preserved left ventricular  function --   . Coronary angioplasty with stent placement  10/20/2003    Percutaneous coronary intervention/drug-eluding stent implantation, third marginal branch --  Percutaneous closure, right femoral artery -- Atherosclerotic coronary vascular disease, single vessel / Status post successful percutaneous coronary intervention/tandem drug -- eluding stent implantation proximal third marginal branch -- Typical angina was not reproduced with device insertion or balloon   . Hematoma evacuation  05/09/2003    Evacuation of hematoma, right neck  . Coronary artery bypass graft  08/29/2010    CABG X2; SVG to PDA, SVG to OM2- [facility]  . Aortic valve replacement  08/29/2010    25 mm CE Magna bioprosthetic - [facility]  . Mitral valve repair  08/23/2010    28 mm Simulus ring -[facility]  . Tonsillectomy and adenoidectomy  1956  . Appendectomy  1951  . Posterior fusion lumbar spine  1997  . Cervical disc surgery  1980's  . Anterior cervical decomp/discectomy fusion  2000's  . Skin cancer  excision  1990's; 2012    "left chest & right nose" (03/26/2012)  . Cardiac valve replacement    . Tee without cardioversion N/A 05/15/2012    Procedure: TRANSESOPHAGEAL ECHOCARDIOGRAM (TEE);  Surgeon: Vesta Mixer, MD;  Location: [facility] ENDOSCOPY;  Service: Cardiovascular;  Laterality: N/A;    FAMILY HISTORY: Family History  Problem Relation Age of Onset  . Diabetes Mother   . Coronary artery disease Mother   . Heart disease Mother   . Stroke Father   . Cancer Daughter     SOCIAL HISTORY: History   Social History  . Marital Status: Married  Spouse Name: N/A    Number of Children: N/A  . Years of Education: N/A   Occupational History  . Not on file.   Social History Main Topics  . Smoking status: Former Smoker -- 5.00 packs/day for 23 years    Types: Cigarettes    Quit date: 10/02/1978  . Smokeless tobacco: Never Used  . Alcohol Use: No     Comment: 03/26/2012 "last alcohol was in 1970; never had problem w/it"  . Drug Use: No  . Sexual Activity: No   Other Topics Concern  . Not on file   Social History Narrative  . No narrative on file     PHYSICAL EXAM  Filed Vitals:   11/27/12 1535  BP: 115/65  Pulse: 80  Temp: 98.4 F (36.9 C)  TempSrc: Oral  Height: 5\' 6"  (1.676 m)  Weight: 171 lb (77.565 kg)   Body mass index is 27.61 kg/(m^2).  GENERAL EXAM: Patient is in no distress, well developed and well groomed. HEAD: Symmetric facial features. EARS, NOSE, and THROAT: Normal.  NECK: Supple, no JVD RESPIRATORY: Lungs CTA. CARDIOVASCULAR:  Regular rate and rhythm, no murmurs, no carotid bruits SKIN: No rash, no bruising  NEUROLOGIC: MENTAL STATUS: awake, alert and oriented to person, place and time, language fluent, comprehension intact CRANIAL NERVE: pupils equal and reactive to light, visual fields full to confrontation, extraocular muscles intact, no nystagmus, facial sensation and strength symmetric, uvula midline, shoulder shrug symmetric, tongue  midline.MILD LEFT LOWER FACIAL DROOP. MOTOR: normal bulk and tone, full strength in the BUE, BLE, No drift. Orbits right over left upper extremity. DECREASED FINE MOTOR ON LEFT. SENSORY: normal and symmetric to light touch, vibration COORDINATION: finger-nose-finger normal REFLEXES: deep tendon reflexes present and symmetric 2+ GAIT/STATION: narrow based gait; able to walk on toes, heels and tandem; romberg is negative. No assistive device.  DIAGNOSTIC DATA (LABS, IMAGING, TESTING) - I reviewed patient records, labs, notes, testing and imaging myself where available.  Lab Results  Component Value Date   WBC 11.1* 05/10/2012   HGB 11.4* 05/10/2012   HCT 32.5* 05/10/2012   MCV 85.3 05/10/2012   PLT 266 05/10/2012      Component Value Date/Time   NA 140 05/10/2012 0229   K 4.3 05/10/2012 0229   CL 104 05/10/2012 0229   CO2 26 05/10/2012 0229   GLUCOSE 143* 05/10/2012 0229   BUN 11 05/10/2012 0229   CREATININE 0.77 05/10/2012 0229   CALCIUM 9.5 05/10/2012 0229   PROT 7.0 05/09/2012 0720   ALBUMIN 3.7 05/09/2012 0720   AST 17 05/09/2012 0720   ALT 14 05/09/2012 0720   ALKPHOS 65 05/09/2012 0720   BILITOT 0.5 05/09/2012 0720   GFRNONAA 87* 05/10/2012 0229   GFRAA >90 05/10/2012 0229   Lab Results  Component Value Date   CHOL 117 05/10/2012   HDL 40 05/10/2012   LDLCALC 54 05/10/2012   TRIG 114 05/10/2012   CHOLHDL 2.9 05/10/2012   Lab Results  Component Value Date   HGBA1C 7.9* 05/10/2012   No results found for this basename: [id number]   Lab Results  Component Value Date   TSH 2.847 03/26/2012   MRI of the brain 05/09/12 acute infarct right posterior frontal cortex over the convexity. Mild chronic microvascular ischemic changes in the white matter. Chronic microhemorrhages throughout the brain. Unchanged from prior study.  Carotid Doppler right ICA: No stenosis. Left ICA: Less than 40% stenosis. Bilateral vertebrals: Antegrade flow.  ASSESSMENT AND PLAN [name] is a  77 year old male  here for follow up of right posterior frontal cortex infarct on 05/09/2012. Infarct felt to be embolic secondary to unknown etiology. He has residual mild dysarthria when tired.  He was placed on Coumadin by his [facility] cardiologist for unknown reason as to our knowledge there is no documented atrial fibrillation.   Hyperlipidemia, LDL 54, on statin at goal LDL less than 100. CAD,hx CABG with stents Hypertension at goal less than 130/80 Diabetes, on metformin, not at goal hemoglobin A1c less than 6.5 (most current 7.9)  Plan:  Continue warfarin  for secondary stroke prevention but will need to discuss with cardiologist rationale as work up in hospital had not shown any clear cardiac source of embolism .Maintain strict control of hypertension with blood pressure goal below 130/90, diabetes with hemoglobin A1c goal below 6.5% and lipids with LDL cholesterol goal below 100 mg/dL.I am not sure why he is on warfarin Followup in the future with Heide Guile, NP in 6 months  Delia Heady, MD  11/27/2012, 9:44 PM  [facility] [address], [address] [place], [place] [address] [phone]

## 2012-12-05 DIAGNOSIS — R3129 Other microscopic hematuria: Secondary | ICD-10-CM | POA: Diagnosis not present

## 2012-12-09 DIAGNOSIS — Z7901 Long term (current) use of anticoagulants: Secondary | ICD-10-CM | POA: Diagnosis not present

## 2012-12-18 DIAGNOSIS — D235 Other benign neoplasm of skin of trunk: Secondary | ICD-10-CM | POA: Diagnosis not present

## 2012-12-18 DIAGNOSIS — Z85828 Personal history of other malignant neoplasm of skin: Secondary | ICD-10-CM | POA: Diagnosis not present

## 2012-12-18 DIAGNOSIS — D1801 Hemangioma of skin and subcutaneous tissue: Secondary | ICD-10-CM | POA: Diagnosis not present

## 2012-12-18 DIAGNOSIS — D239 Other benign neoplasm of skin, unspecified: Secondary | ICD-10-CM | POA: Diagnosis not present

## 2012-12-18 DIAGNOSIS — L821 Other seborrheic keratosis: Secondary | ICD-10-CM | POA: Diagnosis not present

## 2012-12-18 DIAGNOSIS — L570 Actinic keratosis: Secondary | ICD-10-CM | POA: Diagnosis not present

## 2013-01-06 DIAGNOSIS — I359 Nonrheumatic aortic valve disorder, unspecified: Secondary | ICD-10-CM | POA: Diagnosis not present

## 2013-01-06 DIAGNOSIS — Z8673 Personal history of transient ischemic attack (TIA), and cerebral infarction without residual deficits: Secondary | ICD-10-CM | POA: Diagnosis not present

## 2013-01-06 DIAGNOSIS — IMO0002 Reserved for concepts with insufficient information to code with codable children: Secondary | ICD-10-CM | POA: Diagnosis not present

## 2013-01-07 DIAGNOSIS — I359 Nonrheumatic aortic valve disorder, unspecified: Secondary | ICD-10-CM | POA: Diagnosis not present

## 2013-01-07 DIAGNOSIS — Z7901 Long term (current) use of anticoagulants: Secondary | ICD-10-CM | POA: Diagnosis not present

## 2013-01-09 DIAGNOSIS — Z23 Encounter for immunization: Secondary | ICD-10-CM | POA: Diagnosis not present

## 2013-01-23 ENCOUNTER — Other Ambulatory Visit: Payer: Self-pay

## 2013-01-23 DIAGNOSIS — I059 Rheumatic mitral valve disease, unspecified: Secondary | ICD-10-CM | POA: Diagnosis not present

## 2013-02-04 DIAGNOSIS — E785 Hyperlipidemia, unspecified: Secondary | ICD-10-CM | POA: Diagnosis not present

## 2013-02-04 DIAGNOSIS — E1129 Type 2 diabetes mellitus with other diabetic kidney complication: Secondary | ICD-10-CM | POA: Diagnosis not present

## 2013-02-04 DIAGNOSIS — N182 Chronic kidney disease, stage 2 (mild): Secondary | ICD-10-CM | POA: Diagnosis not present

## 2013-02-04 DIAGNOSIS — K219 Gastro-esophageal reflux disease without esophagitis: Secondary | ICD-10-CM | POA: Diagnosis not present

## 2013-02-04 DIAGNOSIS — I10 Essential (primary) hypertension: Secondary | ICD-10-CM | POA: Diagnosis not present

## 2013-02-04 DIAGNOSIS — Z1331 Encounter for screening for depression: Secondary | ICD-10-CM | POA: Diagnosis not present

## 2013-02-20 DIAGNOSIS — Z7901 Long term (current) use of anticoagulants: Secondary | ICD-10-CM | POA: Diagnosis not present

## 2013-03-04 DIAGNOSIS — Z7901 Long term (current) use of anticoagulants: Secondary | ICD-10-CM | POA: Diagnosis not present

## 2013-03-04 DIAGNOSIS — I6340 Cerebral infarction due to embolism of unspecified cerebral artery: Secondary | ICD-10-CM | POA: Diagnosis not present

## 2013-03-10 DIAGNOSIS — E119 Type 2 diabetes mellitus without complications: Secondary | ICD-10-CM | POA: Diagnosis not present

## 2013-03-10 DIAGNOSIS — H43819 Vitreous degeneration, unspecified eye: Secondary | ICD-10-CM | POA: Diagnosis not present

## 2013-03-10 DIAGNOSIS — H2510 Age-related nuclear cataract, unspecified eye: Secondary | ICD-10-CM | POA: Diagnosis not present

## 2013-04-01 DIAGNOSIS — I6350 Cerebral infarction due to unspecified occlusion or stenosis of unspecified cerebral artery: Secondary | ICD-10-CM | POA: Diagnosis not present

## 2013-04-01 DIAGNOSIS — I6789 Other cerebrovascular disease: Secondary | ICD-10-CM | POA: Diagnosis not present

## 2013-04-01 DIAGNOSIS — Z7901 Long term (current) use of anticoagulants: Secondary | ICD-10-CM | POA: Diagnosis not present

## 2013-04-01 DIAGNOSIS — E785 Hyperlipidemia, unspecified: Secondary | ICD-10-CM | POA: Insufficient documentation

## 2013-04-10 DIAGNOSIS — I6350 Cerebral infarction due to unspecified occlusion or stenosis of unspecified cerebral artery: Secondary | ICD-10-CM | POA: Diagnosis not present

## 2013-04-22 DIAGNOSIS — I6509 Occlusion and stenosis of unspecified vertebral artery: Secondary | ICD-10-CM | POA: Diagnosis not present

## 2013-04-29 ENCOUNTER — Other Ambulatory Visit: Payer: Self-pay | Admitting: Cardiovascular Disease

## 2013-04-29 DIAGNOSIS — Z7901 Long term (current) use of anticoagulants: Secondary | ICD-10-CM | POA: Diagnosis not present

## 2013-04-29 DIAGNOSIS — I2510 Atherosclerotic heart disease of native coronary artery without angina pectoris: Secondary | ICD-10-CM | POA: Diagnosis not present

## 2013-05-05 ENCOUNTER — Other Ambulatory Visit: Payer: Self-pay | Admitting: Vascular Surgery

## 2013-05-05 DIAGNOSIS — I6529 Occlusion and stenosis of unspecified carotid artery: Secondary | ICD-10-CM

## 2013-05-08 DIAGNOSIS — I10 Essential (primary) hypertension: Secondary | ICD-10-CM | POA: Diagnosis not present

## 2013-05-08 DIAGNOSIS — K219 Gastro-esophageal reflux disease without esophagitis: Secondary | ICD-10-CM | POA: Diagnosis not present

## 2013-05-08 DIAGNOSIS — E119 Type 2 diabetes mellitus without complications: Secondary | ICD-10-CM | POA: Diagnosis not present

## 2013-05-12 DIAGNOSIS — I359 Nonrheumatic aortic valve disorder, unspecified: Secondary | ICD-10-CM | POA: Diagnosis not present

## 2013-05-12 DIAGNOSIS — Z9889 Other specified postprocedural states: Secondary | ICD-10-CM | POA: Diagnosis not present

## 2013-05-12 DIAGNOSIS — Z952 Presence of prosthetic heart valve: Secondary | ICD-10-CM | POA: Diagnosis not present

## 2013-05-12 DIAGNOSIS — IMO0002 Reserved for concepts with insufficient information to code with codable children: Secondary | ICD-10-CM | POA: Diagnosis not present

## 2013-05-12 DIAGNOSIS — I259 Chronic ischemic heart disease, unspecified: Secondary | ICD-10-CM | POA: Diagnosis not present

## 2013-05-28 ENCOUNTER — Ambulatory Visit: Payer: Medicare Other | Admitting: Neurology

## 2013-05-29 ENCOUNTER — Encounter: Payer: Self-pay | Admitting: Vascular Surgery

## 2013-05-30 ENCOUNTER — Ambulatory Visit (INDEPENDENT_AMBULATORY_CARE_PROVIDER_SITE_OTHER): Payer: Medicare Other | Admitting: Vascular Surgery

## 2013-05-30 ENCOUNTER — Encounter: Payer: Self-pay | Admitting: Vascular Surgery

## 2013-05-30 ENCOUNTER — Ambulatory Visit (HOSPITAL_COMMUNITY)
Admission: RE | Admit: 2013-05-30 | Discharge: 2013-05-30 | Disposition: A | Discharge status: Home / Self Care | Payer: Medicare Other | Source: Ambulatory Visit | Attending: Vascular Surgery | Admitting: Vascular Surgery

## 2013-05-30 VITALS — BP 166/79 | HR 60 | Resp 18 | Ht 66.0 in | Wt 177.0 lb

## 2013-05-30 DIAGNOSIS — I6529 Occlusion and stenosis of unspecified carotid artery: Secondary | ICD-10-CM | POA: Insufficient documentation

## 2013-05-30 NOTE — Progress Notes (Signed)
Established Carotid Patient (>3 years)   History of Present Illness  [name] is a 78 y.o. (12-29-1935) male RHD /sp R CEA 2005 (Dr. Amedeo Plenty) who presents with chief complaint: prior stroke in 2014.  Previous carotid studies demonstrated: RICA: widely patent CEA, LICA [id number] stenosis.  Patient has known history of TIA or stroke symptom.  The patient has never had amaurosis fugax or monocular blindness.  The patient has never had facial drooping or hemiplegia.  The patient has had expressive aphasia.  He had a R posterior frontal CVA which left him with some word finding.  Due his CVA episode, the patient has been placed on coumadin and aspirin.  Past Medical History  Diagnosis Date  . Hyperlipidemia   . Mitral valve prolapse   . Aortic valve regurgitation   . Degenerative disk disease   . Major depression   . Spinal stenosis   . Coronary artery disease     post stents; prior CABG; s/p cath January 2014 with 2 VD, patent SVG to [facility] and patent SVG to PL patent  . Hypertension   . Carotid artery occlusion   . Heart murmur   . Anginal pain   . History of esophageal stricture     "has it stretched q once in awhile" (03/26/2012"  . OSA (obstructive sleep apnea)     "have a mask; haven't used one in years" (03/26/2012)  . Type II diabetes mellitus   . Stroke 2014  . Kidney stones   . Skin cancer of nose 2012    "right side" (03/26/2012)  . Skin cancer of trunk     "left chest" (03/26/2012)  . GERD (gastroesophageal reflux disease)     Past Surgical History  Procedure Laterality Date  . Adenoidectomy  1956  . Lumbar disc surgery  1996  . Carotid endarterectomy  05/05/2003    right carotid endarterectomy  . Shoulder arthroscopy  01/07/2007    Left shoulder impingement, labral tear  . Cardiac catheterization  10/20/2003    Est. EF of 65% -- Critical disease in the mid to distal circumflex --  Preserved left ventricular  function --   . Coronary angioplasty with stent placement   10/20/2003    Percutaneous coronary intervention/drug-eluding stent implantation, third marginal branch --  Percutaneous closure, right femoral artery -- Atherosclerotic coronary vascular disease, single vessel / Status post successful percutaneous coronary intervention/tandem drug -- eluding stent implantation proximal third marginal branch -- Typical angina was not reproduced with device insertion or balloon   . Hematoma evacuation  05/09/2003    Evacuation of hematoma, right neck  . Coronary artery bypass graft  08/29/2010    CABG X2; SVG to PDA, SVG to [place].  . Aortic valve replacement  08/29/2010    25 mm CE Magna bioprosthetic - [facility]  . Mitral valve repair  08/23/2010    28 mm Simulus ring -[facility]  . Tonsillectomy and adenoidectomy  1956  . Appendectomy  1951  . Posterior fusion lumbar spine  1997  . Cervical disc surgery  1980's  . Anterior cervical decomp/discectomy fusion  2000's  . Skin cancer excision  1990's; 2012    "left chest & right nose" (03/26/2012)  . Cardiac valve replacement    . Tee without cardioversion N/A 05/15/2012    Procedure: TRANSESOPHAGEAL ECHOCARDIOGRAM (TEE);  Surgeon: Thayer Headings, MD;  Location: [facility] ENDOSCOPY;  Service: Cardiovascular;  Laterality: N/A;    History   Social History  .  Marital Status: Married    Spouse Name: N/A    Number of Children: N/A  . Years of Education: N/A   Occupational History  . Not on file.   Social History Main Topics  . Smoking status: Former Smoker -- 5.00 packs/day for 23 years    Types: Cigarettes    Quit date: 10/02/1978  . Smokeless tobacco: Never Used  . Alcohol Use: No     Comment: 03/26/2012 "last alcohol was in 1970; never had problem w/it"  . Drug Use: No  . Sexual Activity: No   Other Topics Concern  . Not on file   Social History Narrative  . No narrative on file    Family History  Problem Relation Age of Onset  . Diabetes Mother   . Coronary artery disease Mother   . Heart disease  Mother   . Stroke Father   . Cancer Daughter     Current Outpatient Prescriptions on File Prior to Visit  Medication Sig Dispense Refill  . amLODipine (NORVASC) 5 MG tablet Take by mouth daily. Take 1/2 Tab Daily       . aspirin 81 MG chewable tablet Chew 1 tablet (81 mg total) by mouth daily at 6 PM.  30 tablet  0  . FLUoxetine (PROZAC) 20 MG capsule Take 20 mg by mouth daily.      . isosorbide mononitrate (IMDUR) 60 MG 24 hr tablet Take 120 mg by mouth daily.      . Melatonin 3 MG TABS Take 1 tablet by mouth daily.      . metFORMIN (GLUMETZA) 1000 MG (MOD) 24 hr tablet Take 1 tablet (1,000 mg total) by mouth 2 (two) times daily with a meal.      . metoprolol tartrate (LOPRESSOR) 25 MG tablet Take 25 mg by mouth 2 (two) times daily.      . Multiple Vitamin (MULTIVITAMIN PO) Take by mouth.        . nitroGLYCERIN (NITROSTAT) 0.4 MG SL tablet Place 1 tablet (0.4 mg total) under the tongue every 5 (five) minutes as needed. For chest pain  25 tablet  5  . omeprazole (PRILOSEC) 20 MG capsule Take 20 mg by mouth daily.        . rosuvastatin (CRESTOR) 10 MG tablet Take 10 mg by mouth daily.        . vitamin C (ASCORBIC ACID) 500 MG tablet Take 500 mg by mouth 2 (two) times daily.        [name] warfarin (COUMADIN) 5 MG tablet Take 5 mg by mouth every Wednesday and Saturday.      . warfarin (COUMADIN) 7.5 MG tablet Take 7.5 mg by mouth daily. Sun, Mon, Tues, Thurs, Fri       No current facility-administered medications on file prior to visit.   No Known Allergies  REVIEW OF SYSTEMS:  (Positives checked otherwise negative)  CARDIOVASCULAR:  []  chest pain, []  chest pressure, []  palpitations, []  shortness of breath when laying flat, [x]  shortness of breath with exertion,  []  pain in feet when walking, []  pain in feet when laying flat, []  history of blood clot in veins (DVT), []  history of phlebitis, []  swelling in legs, []  varicose veins  PULMONARY:  []  productive cough, []  asthma, []   wheezing  NEUROLOGIC:  []  weakness in arms or legs, []  numbness in arms or legs, []  difficulty speaking or slurred speech, []  temporary loss of vision in one eye, []  dizziness  HEMATOLOGIC:  []  bleeding problems, []   problems with blood clotting too easily  MUSCULOSKEL:  []  joint pain, []  joint swelling  GASTROINTEST:  []  vomiting blood, []  blood in stool     GENITOURINARY:  []  burning with urination, []  blood in urine  PSYCHIATRIC:  []  history of major depression  INTEGUMENTARY:  []  rashes, []  ulcers  CONSTITUTIONAL:  []  fever, []  chills  For VQI Use Only  PRE-ADM LIVING: Home  AMB STATUS: Ambulatory  CAD Sx: None  PRIOR CHF: None  STRESS TEST: [x]  No, [ ]  Normal, [ ]  + ischemia, [ ]  + MI, [ ]  Both  Physical Examination  Filed Vitals:   05/30/13 1401 05/30/13 1405  BP: 157/80 166/79  Pulse: 60 60  Resp: 18   Height: 5\' 6"  (1.676 m)   Weight: 177 lb (80.287 kg)    Body mass index is 28.58 kg/(m^2).  General: A&O x 3, WDWN  Head: [place]/AT  Ear/Nose/Throat: Hearing grossly intact, nares w/o erythema or drainage, oropharynx w/o Erythema/Exudate, Mallampati score: 3  Eyes: PERRLA, EOMI  Neck: Supple, no nuchal rigidity, no palpable LAD  Pulmonary: Sym exp, good air movt, CTAB, no rales, rhonchi, & wheezing  Cardiac: RRR, Nl S1, S2, no Murmurs, rubs or gallops  Vascular: Vessel Right Left  Radial Palpable Palpable  Brachial Palpable Palpable  Carotid Palpable, without bruit Palpable, without bruit  Aorta  Not palpable N/A  Femoral Palpable Palpable  Popliteal Not palpable Not palpable  PT Palpable Palpable  DP Palpable Palpable   Gastrointestinal: soft, NTND, -G/R, - HSM, - masses, - CVAT B  Musculoskeletal: M/S 5/5 throughout , Extremities without ischemic changes   Neurologic: CN 2-12 intact , Pain and light touch intact in extremities , Motor exam as listed above  Psychiatric: Judgment intact, Mood & affect appropriate for pt's clinical  situation  Dermatologic: See M/S exam for extremity exam, no rashes otherwise noted  Lymph : No Cervical, Axillary, or Inguinal lymphadenopathy   Non-Invasive Vascular Imaging  CAROTID DUPLEX (Date: 05/30/2013):   R ICA stenosis: widely patent CEA  R VA: patent and antegrade  L ICA stenosis: <40%  L VA: patent and antegrade  Medical Decision Making  Antonie Naeem Quillin is a 78 y.o. male who presents with: s/p R CEA, s/p R CVA likely due to thromboembolism , asx LICA stenosis <47%.   Based on the patient's vascular studies and examination, I have offered the patient: q2 years B carotid duplex.  The patient has been remarkably stable in his velocities.  With his LDL at target, I doubt he will reaccumulated atherosclerotic plaque in his carotids.  His right CVA last year is unlikely related to his widely patent R CEA.  I discussed in depth with the patient the nature of atherosclerosis, and emphasized the importance of maximal medical management including strict control of blood pressure, blood glucose, and lipid levels, obtaining regular exercise, antiplatelet agents, and cessation of smoking.   The patient is currently on a statin: Crestor.  The patient is currently on an anti-platelet: ASA,  The patient is also on Coumadin.  The patient is aware that without maximal medical management the underlying atherosclerotic disease process will progress, limiting the benefit of any interventions.  Thank you for allowing [place] to participate in this patient's care.  Adele Barthel, MD [facility] Office: [phone] Pager: [phone]  05/30/2013, 2:50 PM

## 2013-06-02 NOTE — Addendum Note (Signed)
Addended by: Mena Goes on: 06/02/2013 04:09 PM   Modules accepted: Orders

## 2013-06-04 DIAGNOSIS — I6789 Other cerebrovascular disease: Secondary | ICD-10-CM | POA: Diagnosis not present

## 2013-06-04 DIAGNOSIS — Z7901 Long term (current) use of anticoagulants: Secondary | ICD-10-CM | POA: Diagnosis not present

## 2013-06-04 DIAGNOSIS — Z23 Encounter for immunization: Secondary | ICD-10-CM | POA: Diagnosis not present

## 2013-06-12 ENCOUNTER — Ambulatory Visit: Payer: Medicare Other | Admitting: Neurology

## 2013-07-07 DIAGNOSIS — Z7901 Long term (current) use of anticoagulants: Secondary | ICD-10-CM | POA: Diagnosis not present

## 2013-07-07 DIAGNOSIS — R209 Unspecified disturbances of skin sensation: Secondary | ICD-10-CM | POA: Diagnosis not present

## 2013-07-07 DIAGNOSIS — I10 Essential (primary) hypertension: Secondary | ICD-10-CM | POA: Diagnosis not present

## 2013-07-07 DIAGNOSIS — E119 Type 2 diabetes mellitus without complications: Secondary | ICD-10-CM | POA: Diagnosis not present

## 2013-07-07 DIAGNOSIS — I6789 Other cerebrovascular disease: Secondary | ICD-10-CM | POA: Diagnosis not present

## 2013-07-15 DIAGNOSIS — N138 Other obstructive and reflux uropathy: Secondary | ICD-10-CM | POA: Diagnosis not present

## 2013-07-15 DIAGNOSIS — R3129 Other microscopic hematuria: Secondary | ICD-10-CM | POA: Diagnosis not present

## 2013-07-15 DIAGNOSIS — N139 Obstructive and reflux uropathy, unspecified: Secondary | ICD-10-CM | POA: Diagnosis not present

## 2013-07-15 DIAGNOSIS — N401 Enlarged prostate with lower urinary tract symptoms: Secondary | ICD-10-CM | POA: Diagnosis not present

## 2013-08-04 DIAGNOSIS — I6789 Other cerebrovascular disease: Secondary | ICD-10-CM | POA: Diagnosis not present

## 2013-08-04 DIAGNOSIS — Z7901 Long term (current) use of anticoagulants: Secondary | ICD-10-CM | POA: Diagnosis not present

## 2013-09-01 DIAGNOSIS — I6340 Cerebral infarction due to embolism of unspecified cerebral artery: Secondary | ICD-10-CM | POA: Diagnosis not present

## 2013-09-01 DIAGNOSIS — Z7901 Long term (current) use of anticoagulants: Secondary | ICD-10-CM | POA: Diagnosis not present

## 2013-09-15 DIAGNOSIS — Z9889 Other specified postprocedural states: Secondary | ICD-10-CM | POA: Diagnosis not present

## 2013-09-15 DIAGNOSIS — Z8673 Personal history of transient ischemic attack (TIA), and cerebral infarction without residual deficits: Secondary | ICD-10-CM | POA: Diagnosis not present

## 2013-09-15 DIAGNOSIS — Z952 Presence of prosthetic heart valve: Secondary | ICD-10-CM | POA: Diagnosis not present

## 2013-09-15 DIAGNOSIS — I2510 Atherosclerotic heart disease of native coronary artery without angina pectoris: Secondary | ICD-10-CM | POA: Diagnosis not present

## 2013-10-02 DIAGNOSIS — Z7901 Long term (current) use of anticoagulants: Secondary | ICD-10-CM | POA: Diagnosis not present

## 2013-10-02 DIAGNOSIS — Z5181 Encounter for therapeutic drug level monitoring: Secondary | ICD-10-CM | POA: Diagnosis not present

## 2013-10-16 DIAGNOSIS — I6340 Cerebral infarction due to embolism of unspecified cerebral artery: Secondary | ICD-10-CM | POA: Diagnosis not present

## 2013-10-16 DIAGNOSIS — Z7901 Long term (current) use of anticoagulants: Secondary | ICD-10-CM | POA: Diagnosis not present

## 2013-11-03 DIAGNOSIS — Z7901 Long term (current) use of anticoagulants: Secondary | ICD-10-CM | POA: Diagnosis not present

## 2013-11-03 DIAGNOSIS — E1129 Type 2 diabetes mellitus with other diabetic kidney complication: Secondary | ICD-10-CM | POA: Diagnosis not present

## 2013-11-03 DIAGNOSIS — N182 Chronic kidney disease, stage 2 (mild): Secondary | ICD-10-CM | POA: Diagnosis not present

## 2013-11-03 DIAGNOSIS — R404 Transient alteration of awareness: Secondary | ICD-10-CM | POA: Diagnosis not present

## 2013-11-03 DIAGNOSIS — R209 Unspecified disturbances of skin sensation: Secondary | ICD-10-CM | POA: Diagnosis not present

## 2013-11-03 DIAGNOSIS — I6340 Cerebral infarction due to embolism of unspecified cerebral artery: Secondary | ICD-10-CM | POA: Diagnosis not present

## 2013-11-03 DIAGNOSIS — I10 Essential (primary) hypertension: Secondary | ICD-10-CM | POA: Diagnosis not present

## 2013-11-07 DIAGNOSIS — I10 Essential (primary) hypertension: Secondary | ICD-10-CM | POA: Diagnosis not present

## 2013-11-07 DIAGNOSIS — R069 Unspecified abnormalities of breathing: Secondary | ICD-10-CM | POA: Diagnosis not present

## 2013-11-07 DIAGNOSIS — G4710 Hypersomnia, unspecified: Secondary | ICD-10-CM | POA: Diagnosis not present

## 2013-11-11 IMAGING — CT CT ANGIO CHEST
2 of 6 series · 19 of 36 positions shown · IV contrast (Omnipaque 300)
Comparison: 03/27/2012 radiographs

CLINICAL DATA: Intermittent left chest pain.

CT ANGIOGRAPHY CHEST
TECHNIQUE: Multidetector CT imaging of the chest using the
standard protocol during bolus administration of intravenous
contrast. Multiplanar reconstructed images including MIPs were
obtained and reviewed to evaluate the vascular anatomy.
Contrast: 80mL OMNIPAQUE IOHEXOL 350 MG/ML SOLN

[Series 5: thins (id) / (id) · axial · 0.75mm/px · z∈[-256,-6]mm · 18 of 278 slices shown]
[im 14/278  lung]
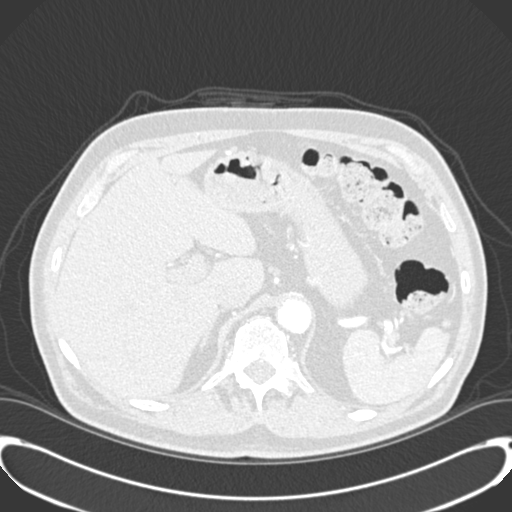
[im 28/278  mediastinal]
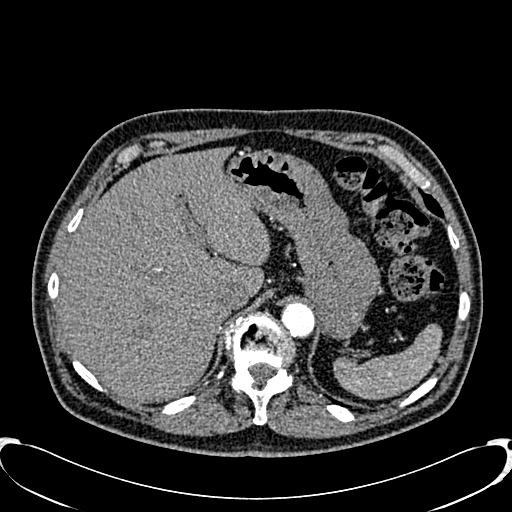
[im 42/278  lung]
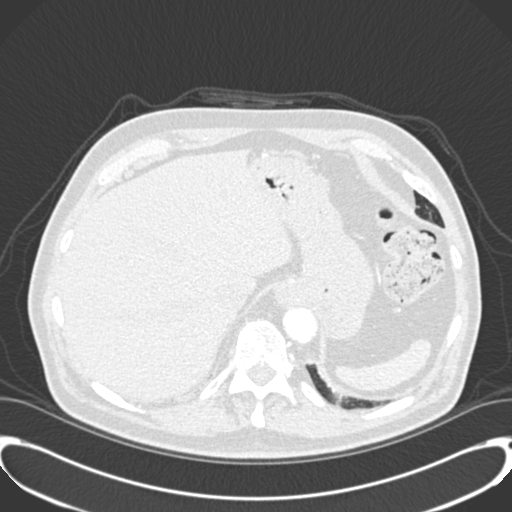
[im 56/278  mediastinal]
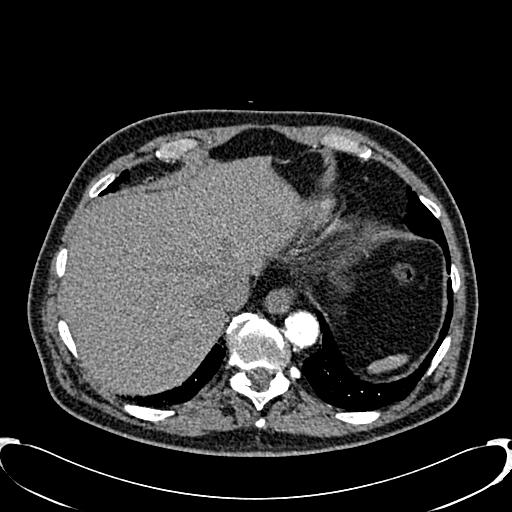
[im 70/278  lung]
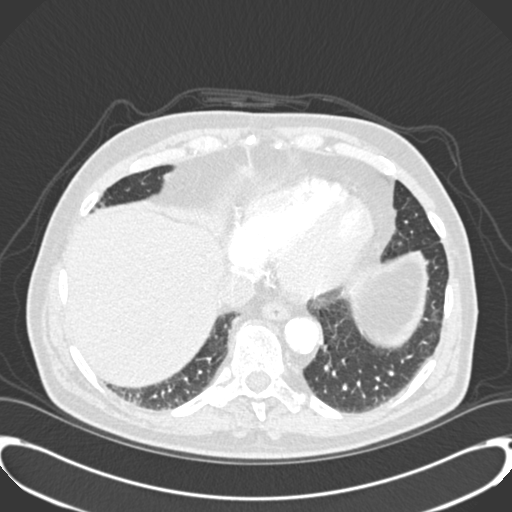
[im 84/278  mediastinal]
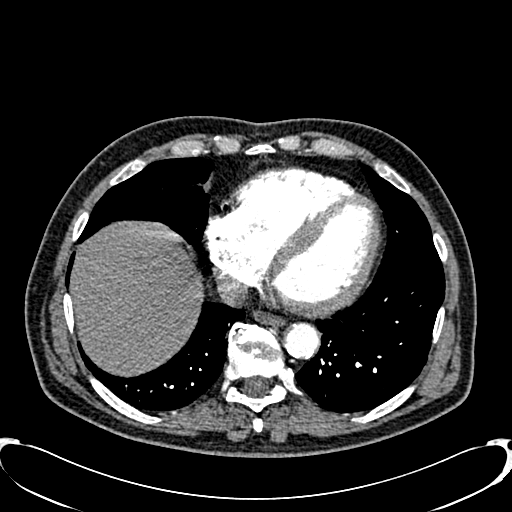
[im 97/278  lung]
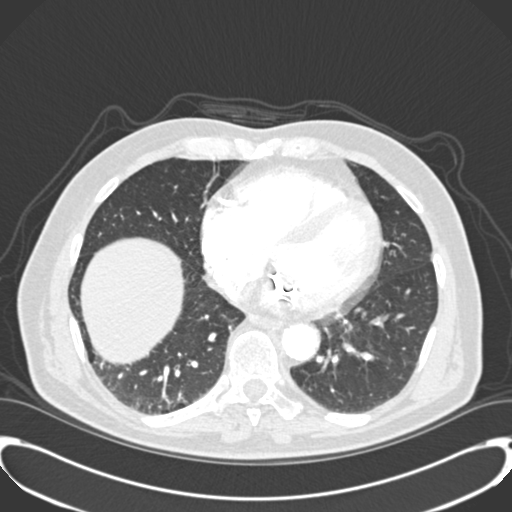
[im 111/278  mediastinal]
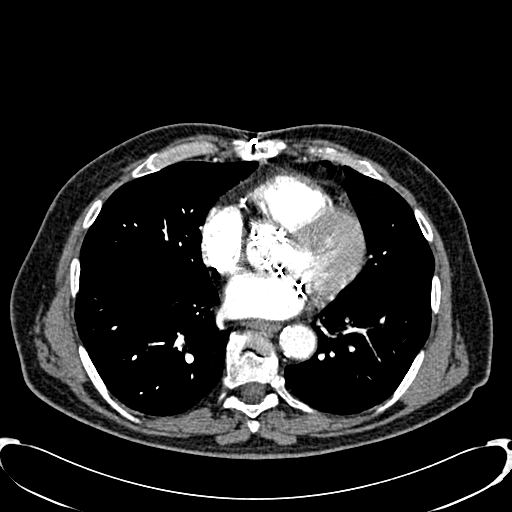
[im 125/278  lung]
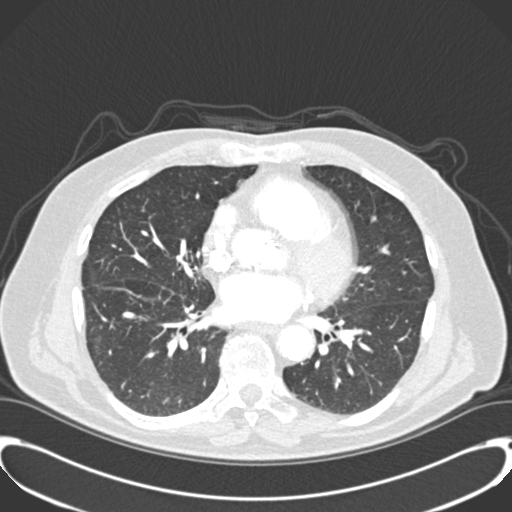
[im 153/278  mediastinal]
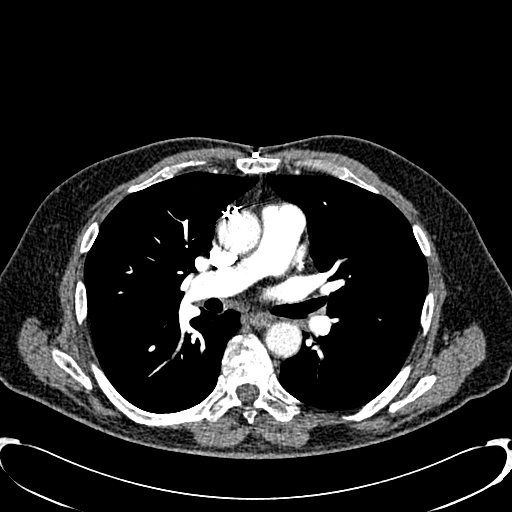
[im 167/278  lung]
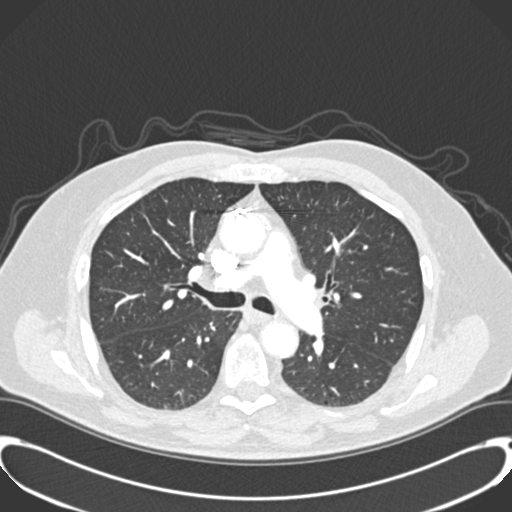
[im 181/278  mediastinal]
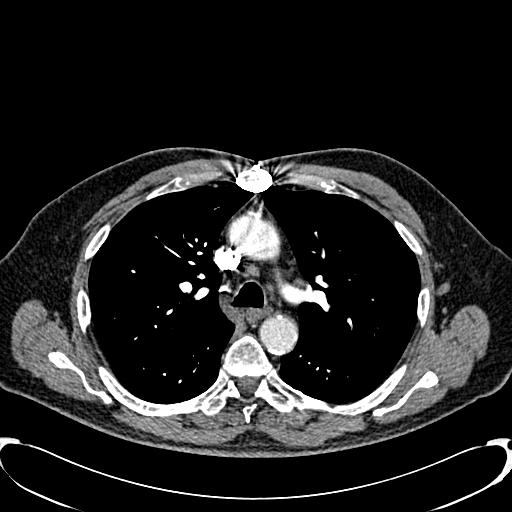
[im 194/278  lung]
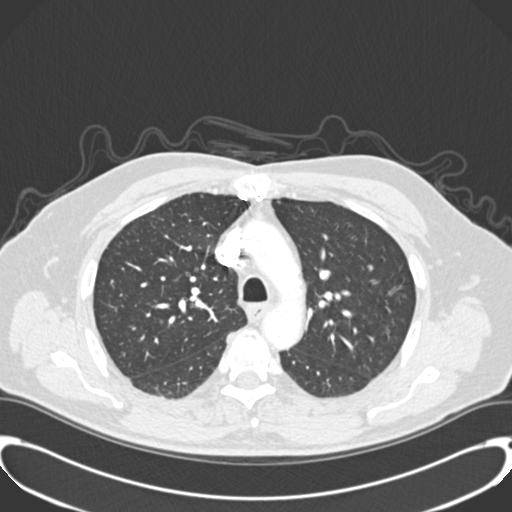
[im 208/278  mediastinal]
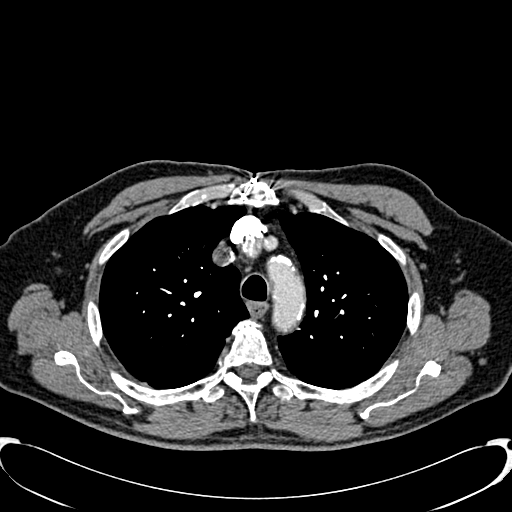
[im 222/278  lung]
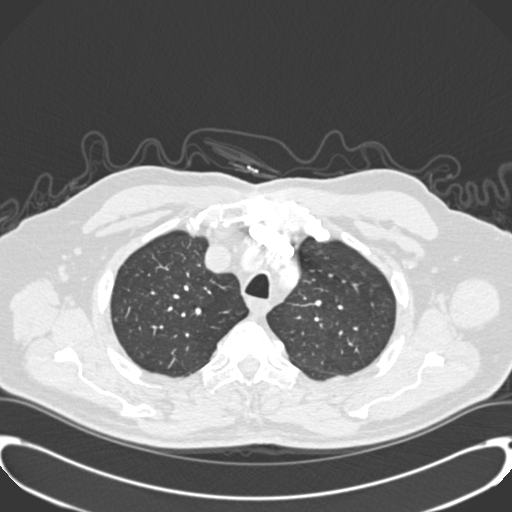
[im 236/278  mediastinal]
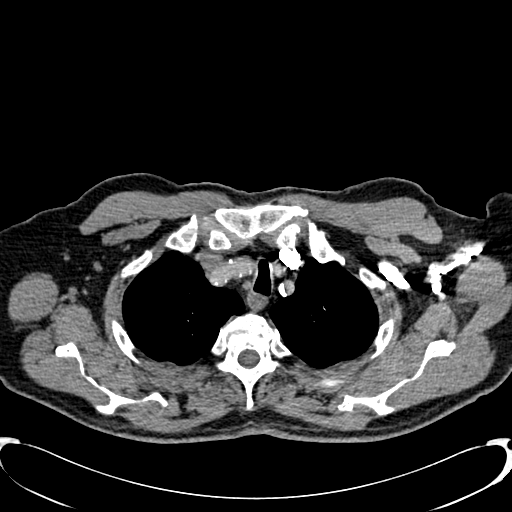
[im 250/278  lung]
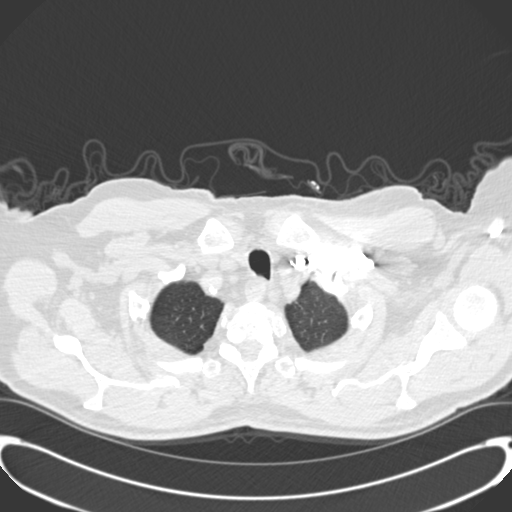
[im 264/278  mediastinal]
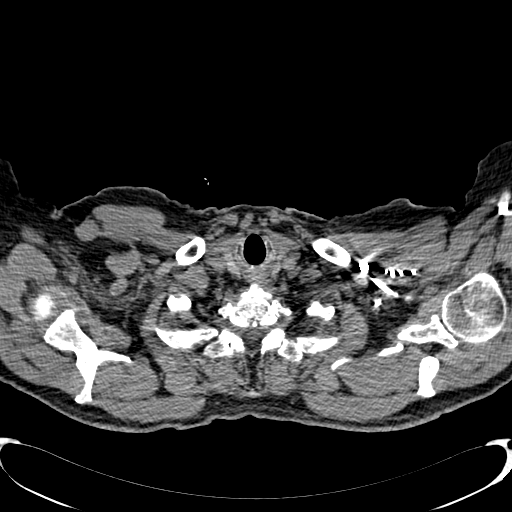

[Series 602: cor mpr · coronal · 0.75mm/px · 1 of 106 slices shown]
[im 53/106  mediastinal]
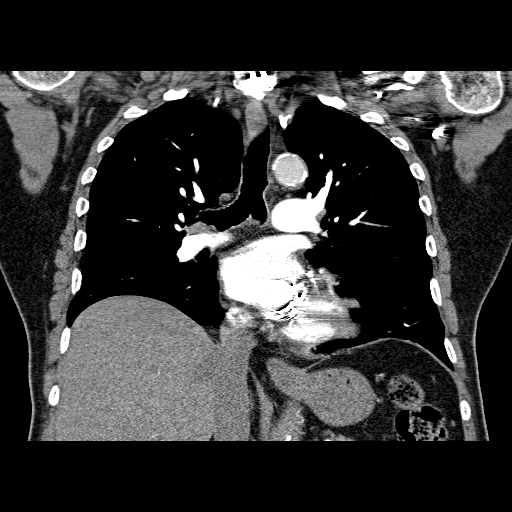

[19 of 36 positions shown; findings below may reference images not displayed]

FINDINGS: No filling defect is identified in the pulmonary arterial
tree to suggest pulmonary embolus.

Atherosclerotic calcification of the aortic arch and descending
aorta noted.  Prior CABG and mitral and aortic valve prosthesis
observed.

No reversal of the normal interventricular septal contour.  Mild
right ventricular prominence is noted.

A right hilar lymph node is mildly enlarged at 1.1 cm.  No
pathologic mediastinal adenopathy.  Faint density anterior to the
ascending aorta on image 32 of series 4 probably represents mural
calcification and is unlikely to represent a penetrating ulcer.
IMPRESSION: 1.  No embolus identified.  No acute aortic findings.
2.  Mildly enlarged right hilar lymph node is of uncertain
significance, but more likely to be reactive than neoplastic.
3.  Prior CABG and prior aortic and mitral valve replacements.
4.  Atherosclerosis.

## 2013-11-17 DIAGNOSIS — N318 Other neuromuscular dysfunction of bladder: Secondary | ICD-10-CM | POA: Diagnosis not present

## 2013-11-17 DIAGNOSIS — N139 Obstructive and reflux uropathy, unspecified: Secondary | ICD-10-CM | POA: Diagnosis not present

## 2013-11-17 DIAGNOSIS — N401 Enlarged prostate with lower urinary tract symptoms: Secondary | ICD-10-CM | POA: Diagnosis not present

## 2013-11-17 DIAGNOSIS — R3129 Other microscopic hematuria: Secondary | ICD-10-CM | POA: Diagnosis not present

## 2013-11-21 DIAGNOSIS — I6340 Cerebral infarction due to embolism of unspecified cerebral artery: Secondary | ICD-10-CM | POA: Diagnosis not present

## 2013-11-21 DIAGNOSIS — Z7901 Long term (current) use of anticoagulants: Secondary | ICD-10-CM | POA: Diagnosis not present

## 2013-11-26 DIAGNOSIS — G4734 Idiopathic sleep related nonobstructive alveolar hypoventilation: Secondary | ICD-10-CM | POA: Diagnosis not present

## 2013-12-17 ENCOUNTER — Encounter: Payer: Self-pay | Admitting: Neurology

## 2013-12-17 ENCOUNTER — Ambulatory Visit (INDEPENDENT_AMBULATORY_CARE_PROVIDER_SITE_OTHER): Payer: Medicare Other | Admitting: Neurology

## 2013-12-17 VITALS — BP 128/74 | HR 70 | Resp 18 | Ht 66.0 in | Wt 174.6 lb

## 2013-12-17 DIAGNOSIS — Z952 Presence of prosthetic heart valve: Secondary | ICD-10-CM

## 2013-12-17 DIAGNOSIS — I10 Essential (primary) hypertension: Secondary | ICD-10-CM | POA: Diagnosis not present

## 2013-12-17 DIAGNOSIS — I6529 Occlusion and stenosis of unspecified carotid artery: Secondary | ICD-10-CM | POA: Diagnosis not present

## 2013-12-17 DIAGNOSIS — I639 Cerebral infarction, unspecified: Secondary | ICD-10-CM

## 2013-12-17 DIAGNOSIS — I6350 Cerebral infarction due to unspecified occlusion or stenosis of unspecified cerebral artery: Secondary | ICD-10-CM | POA: Diagnosis not present

## 2013-12-17 DIAGNOSIS — Z953 Presence of xenogenic heart valve: Secondary | ICD-10-CM

## 2013-12-17 NOTE — Patient Instructions (Signed)
1.  Continue the aspirin and Coumadin for now. 2.  I will get the notes from the neurologist and cardiologist at [facility] 3.  I want you to return in 6 weeks after I go over everything.

## 2013-12-17 NOTE — Progress Notes (Signed)
NEUROLOGY CONSULTATION NOTE  [name] MRN: [id number] DOB: 1936-01-23  Referring provider: Dr. Laurann Montana Primary care provider: Dr. Laurann Montana  Reason for consult:  Stroke  HISTORY OF PRESENT ILLNESS: [name] is a 78 year old right-haned man with history of right embolic posterior frontal cortex infarct (05/09/12), hypertension, MVP s/p bioprosthetic valve, spinal stenosis, CAD type II diabetes, CKD state II, carotid artery stenosis s/p R CEA (2005), OSA and skin cancer who presents for hand numbness.  Some records, and images mentioned below, were reviewed.  In June, he woke up one morning and noted numbness on the left side of his lip and the ulnar aspect of his left hand.  There was no associated weakness or new symptoms other than his residual symptoms from his prior stroke (slurred speech, hypophonia, mild difficulty getting words out).  Since then, the lip numbness has resolved, but he continues to have numbness in the left hand.  It is unchanged.  He has history of stroke and carotid artery disease.  He underwent right CEA in 2005.  He was admitted to [facility] in February 2014 for stroke, presenting as slurred speech.  MRI of the brain revealed acute right posterior frontal infarct.  Carotid doppler revealed no hemodynamically significant stenosis in the right ICA.  2-D echo showed normal left ventricular size and function diastolic dysfunction grade 1, and mild right ventricular dilation with aortic bioprosthesis present and mild right ventricular dilation.  He was initially started on Plavix, but was later switched to Aggrenox due to Plavix interaction with Prozac.  Aggrenox was later switched to Coumadin by his cardiologist at [facility], which I suspect is due to his bioprosthetic valve.  The patient says he is unaware that any cardiac etiology such as atrial fibrillation was ever found.  The records from [facility] are not available to me.  He also continues to be on aspirin 81mg   daily.  In early 2015, he followed up with a neurologist at [facility].  He had another MRI and MRA of the head performed.  He, too, recommended remaining on the Coumadin.  He most recently followed up with his vascular surgeon in March.  Carotid doppler performed at that time revealed no hemodynamically significant stenosis.  PAST MEDICAL HISTORY: Past Medical History  Diagnosis Date  . Hyperlipidemia   . Mitral valve prolapse   . Aortic valve regurgitation   . Degenerative disk disease   . Major depression   . Spinal stenosis   . Coronary artery disease     post stents; prior CABG; s/p cath January 2014 with 2 VD, patent SVG to [facility] and patent SVG to PL patent  . Hypertension   . Carotid artery occlusion   . Heart murmur   . Anginal pain   . History of esophageal stricture     "has it stretched q once in awhile" (03/26/2012"  . OSA (obstructive sleep apnea)     "have a mask; haven't used one in years" (03/26/2012)  . Type II diabetes mellitus   . Stroke 2014  . Kidney stones   . Skin cancer of nose 2012    "right side" (03/26/2012)  . Skin cancer of trunk     "left chest" (03/26/2012)  . GERD (gastroesophageal reflux disease)     PAST SURGICAL HISTORY: Past Surgical History  Procedure Laterality Date  . Adenoidectomy  1956  . Lumbar disc surgery  1996  . Carotid endarterectomy  05/05/2003    right carotid endarterectomy  .  Shoulder arthroscopy  01/07/2007    Left shoulder impingement, labral tear  . Cardiac catheterization  10/20/2003    Est. EF of 65% -- Critical disease in the mid to distal circumflex --  Preserved left ventricular  function --   . Coronary angioplasty with stent placement  10/20/2003    Percutaneous coronary intervention/drug-eluding stent implantation, third marginal branch --  Percutaneous closure, right femoral artery -- Atherosclerotic coronary vascular disease, single vessel / Status post successful percutaneous coronary intervention/tandem drug -- eluding  stent implantation proximal third marginal branch -- Typical angina was not reproduced with device insertion or balloon   . Hematoma evacuation  05/09/2003    Evacuation of hematoma, right neck  . Coronary artery bypass graft  08/29/2010    CABG X2; SVG to PDA, SVG to [place].  . Aortic valve replacement  08/29/2010    25 mm CE Magna bioprosthetic - [facility]  . Mitral valve repair  08/23/2010    28 mm Simulus ring -[facility]  . Tonsillectomy and adenoidectomy  1956  . Appendectomy  1951  . Posterior fusion lumbar spine  1997  . Cervical disc surgery  1980's  . Anterior cervical decomp/discectomy fusion  2000's  . Skin cancer excision  1990's; 2012    "left chest & right nose" (03/26/2012)  . Cardiac valve replacement    . Tee without cardioversion N/A 05/15/2012    Procedure: TRANSESOPHAGEAL ECHOCARDIOGRAM (TEE);  Surgeon: Thayer Headings, MD;  Location: [facility] ENDOSCOPY;  Service: Cardiovascular;  Laterality: N/A;    MEDICATIONS: Current Outpatient Prescriptions on File Prior to Visit  Medication Sig Dispense Refill  . amLODipine (NORVASC) 5 MG tablet Take by mouth daily. Take 1/2 Tab Daily       . aspirin 81 MG chewable tablet Chew 1 tablet (81 mg total) by mouth daily at 6 PM.  30 tablet  0  . FLUoxetine (PROZAC) 20 MG capsule Take 20 mg by mouth daily.      . isosorbide mononitrate (IMDUR) 60 MG 24 hr tablet Take 120 mg by mouth daily.      . Melatonin 3 MG TABS Take 1 tablet by mouth daily.      . metFORMIN (GLUMETZA) 1000 MG (MOD) 24 hr tablet Take 1 tablet (1,000 mg total) by mouth 2 (two) times daily with a meal.      . metoprolol tartrate (LOPRESSOR) 25 MG tablet Take 25 mg by mouth 2 (two) times daily.      . Multiple Vitamin (MULTIVITAMIN PO) Take by mouth.        . nitroGLYCERIN (NITROSTAT) 0.4 MG SL tablet Place 1 tablet (0.4 mg total) under the tongue every 5 (five) minutes as needed. For chest pain  25 tablet  5  . omeprazole (PRILOSEC) 20 MG capsule Take 20 mg by mouth daily.         . rosuvastatin (CRESTOR) 10 MG tablet Take 10 mg by mouth daily.        . vitamin C (ASCORBIC ACID) 500 MG tablet Take 500 mg by mouth 2 (two) times daily.        [name] warfarin (COUMADIN) 5 MG tablet Take 5 mg by mouth every Wednesday and Saturday.      . warfarin (COUMADIN) 7.5 MG tablet Take 7.5 mg by mouth daily. Sun, Mon, Tues, Thurs, Fri       No current facility-administered medications on file prior to visit.    ALLERGIES: No Known Allergies  FAMILY HISTORY: Family History  Problem Relation Age of Onset  . Diabetes Mother   . Coronary artery disease Mother   . Heart disease Mother   . Stroke Father   . Cancer Daughter     lymphoma hodgkins   . Seizures Daughter     SOCIAL HISTORY: History   Social History  . Marital Status: Married    Spouse Name: N/A    Number of Children: N/A  . Years of Education: N/A   Occupational History  . Not on file.   Social History Main Topics  . Smoking status: Former Smoker -- 5.00 packs/day for 23 years    Types: Cigarettes    Quit date: 10/02/1978  . Smokeless tobacco: Never Used  . Alcohol Use: No     Comment: 03/26/2012 "last alcohol was in 1970; never had problem w/it"  . Drug Use: No  . Sexual Activity: No   Other Topics Concern  . Not on file   Social History Narrative  . No narrative on file    REVIEW OF SYSTEMS: Constitutional: No fevers, chills, or sweats, no generalized fatigue, change in appetite Eyes: No visual changes, double vision, eye pain Ear, nose and throat: No hearing loss, ear pain, nasal congestion, sore throat Cardiovascular: No chest pain, palpitations Respiratory:  No shortness of breath at rest or with exertion, wheezes GastrointestinaI: No nausea, vomiting, diarrhea, abdominal pain, fecal incontinence Genitourinary:  No dysuria, urinary retention or frequency Musculoskeletal:  No neck pain, back pain Integumentary: No rash, pruritus, skin lesions Neurological: as above Psychiatric: No  depression, insomnia, anxiety Endocrine: No palpitations, fatigue, diaphoresis, mood swings, change in appetite, change in weight, increased thirst Hematologic/Lymphatic:  No anemia, purpura, petechiae. Allergic/Immunologic: no itchy/runny eyes, nasal congestion, recent allergic reactions, rashes  PHYSICAL EXAM: Filed Vitals:   12/17/13 1254  BP: 128/74  Pulse: 70  Resp: 18   General: No acute distress Head:  Normocephalic/atraumatic Neck: supple, no paraspinal tenderness, full range of motion Back: No paraspinal tenderness Heart: regular rate and rhythm Lungs: Clear to auscultation bilaterally. Vascular: No carotid bruits. Neurological Exam: Mental status: alert and oriented to person, place, and time, recent and remote memory intact, fund of knowledge intact, attention and concentration intact, speech fluent and not dysarthric although hypophonic, language intact. Cranial nerves: CN I: not tested CN II: pupils equal, round and reactive to light, visual fields intact, fundi unremarkable, without vessel changes, exudates, hemorrhages or papilledema. CN III, IV, VI:  full range of motion, no nystagmus, no ptosis CN V: facial sensation intact CN VII: mild left lower facial weakness CN VIII: hearing intact CN IX, X: gag intact, uvula midline CN XI: sternocleidomastoid and trapezius muscles intact CN XII: tongue midline Bulk & Tone: normal, no fasciculations. Motor: 5/5 throughout Sensation: Reduced pinprick sensation on ulnar aspect of left hand, otherwise pinprick and vibration intact. Deep Tendon Reflexes: 2+ throughout, except absent in the left patellar and both ankles, toes downgoing Finger to nose testing: no dysmetria Heel to shin: no dysmetria Gait: normal station and stride.  Mild difficulty with tandem. Romberg negative.  IMPRESSION: 1.  Probable recent right parietal infarct, likely embolic from unknown source 2.  History of right frontal ischemic infarct, likely  embolic from unknown source 3.  Carotid artery disease status post left CEA 4.  MVP status post bioprosthetic valve  PLAN: 1.  Continue asa and Coumadin.   2.  Will get records from [place] from his cardiologist and neurologist. 3.   Continue statin (LDL goal should be less  than 100) 4.   Optimize glycemic control (Hgb A1c should be less than 7) 5.   Follow up in 3 months.  Thank you for allowing me to take part in the care of this patient.  Metta Clines, DO  CC:  Lavone Orn, MD

## 2013-12-18 ENCOUNTER — Encounter: Payer: Self-pay | Admitting: *Deleted

## 2013-12-18 DIAGNOSIS — D2262 Melanocytic nevi of left upper limb, including shoulder: Secondary | ICD-10-CM | POA: Diagnosis not present

## 2013-12-18 DIAGNOSIS — L814 Other melanin hyperpigmentation: Secondary | ICD-10-CM | POA: Diagnosis not present

## 2013-12-18 DIAGNOSIS — L821 Other seborrheic keratosis: Secondary | ICD-10-CM | POA: Diagnosis not present

## 2013-12-18 DIAGNOSIS — D225 Melanocytic nevi of trunk: Secondary | ICD-10-CM | POA: Diagnosis not present

## 2013-12-18 DIAGNOSIS — Z85828 Personal history of other malignant neoplasm of skin: Secondary | ICD-10-CM | POA: Diagnosis not present

## 2013-12-18 DIAGNOSIS — D1801 Hemangioma of skin and subcutaneous tissue: Secondary | ICD-10-CM | POA: Diagnosis not present

## 2013-12-18 DIAGNOSIS — D2261 Melanocytic nevi of right upper limb, including shoulder: Secondary | ICD-10-CM | POA: Diagnosis not present

## 2013-12-22 DIAGNOSIS — Z7901 Long term (current) use of anticoagulants: Secondary | ICD-10-CM | POA: Diagnosis not present

## 2013-12-22 DIAGNOSIS — I639 Cerebral infarction, unspecified: Secondary | ICD-10-CM | POA: Diagnosis not present

## 2013-12-22 DIAGNOSIS — Z23 Encounter for immunization: Secondary | ICD-10-CM | POA: Diagnosis not present

## 2014-01-05 DIAGNOSIS — N401 Enlarged prostate with lower urinary tract symptoms: Secondary | ICD-10-CM | POA: Diagnosis not present

## 2014-01-05 DIAGNOSIS — N3281 Overactive bladder: Secondary | ICD-10-CM | POA: Diagnosis not present

## 2014-01-05 DIAGNOSIS — R3915 Urgency of urination: Secondary | ICD-10-CM | POA: Diagnosis not present

## 2014-01-19 DIAGNOSIS — Z7901 Long term (current) use of anticoagulants: Secondary | ICD-10-CM | POA: Diagnosis not present

## 2014-01-19 DIAGNOSIS — I639 Cerebral infarction, unspecified: Secondary | ICD-10-CM | POA: Diagnosis not present

## 2014-01-28 ENCOUNTER — Ambulatory Visit: Payer: Medicare Other | Admitting: Neurology

## 2014-02-11 ENCOUNTER — Telehealth: Payer: Self-pay | Admitting: Neurology

## 2014-02-11 ENCOUNTER — Encounter: Payer: Self-pay | Admitting: Neurology

## 2014-02-11 NOTE — Telephone Encounter (Signed)
Spoke with patient and made him aware we have not received heart and neurology records from [place]. F/U Cancelled. Made him aware we would call to r/s once records obtained. Susie- please get records from [place]. Not in Care Everywhere.

## 2014-02-16 ENCOUNTER — Ambulatory Visit: Payer: Medicare Other | Admitting: Neurology

## 2014-02-16 DIAGNOSIS — I639 Cerebral infarction, unspecified: Secondary | ICD-10-CM | POA: Diagnosis not present

## 2014-02-16 DIAGNOSIS — Z7901 Long term (current) use of anticoagulants: Secondary | ICD-10-CM | POA: Diagnosis not present

## 2014-02-20 ENCOUNTER — Encounter: Payer: Self-pay | Admitting: Neurology

## 2014-02-20 ENCOUNTER — Ambulatory Visit (INDEPENDENT_AMBULATORY_CARE_PROVIDER_SITE_OTHER): Payer: Medicare Other | Admitting: Neurology

## 2014-02-20 VITALS — BP 132/74 | HR 70 | Temp 97.7°F | Resp 16 | Ht 66.0 in | Wt 176.0 lb

## 2014-02-20 DIAGNOSIS — I6529 Occlusion and stenosis of unspecified carotid artery: Secondary | ICD-10-CM

## 2014-02-20 DIAGNOSIS — I10 Essential (primary) hypertension: Secondary | ICD-10-CM

## 2014-02-20 DIAGNOSIS — I6521 Occlusion and stenosis of right carotid artery: Secondary | ICD-10-CM | POA: Diagnosis not present

## 2014-02-20 DIAGNOSIS — I639 Cerebral infarction, unspecified: Secondary | ICD-10-CM

## 2014-02-20 DIAGNOSIS — I680 Cerebral amyloid angiopathy: Secondary | ICD-10-CM

## 2014-02-20 DIAGNOSIS — Z953 Presence of xenogenic heart valve: Secondary | ICD-10-CM | POA: Diagnosis not present

## 2014-02-20 DIAGNOSIS — E854 Organ-limited amyloidosis: Secondary | ICD-10-CM

## 2014-02-20 HISTORY — DX: Presence of xenogenic heart valve: Z95.3

## 2014-02-20 NOTE — Progress Notes (Signed)
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  Notes and MRI and MRA of the brain reviewed.  UPDATE: He has been doing well.  He still notes numbness in left hand.  I was able to review the scans of his MRI and MRA of the brain performed on 04/22/13.  It also shows several tiny microhemorrhages on gradient echo, suspicious for amyloid angiopathy.  HISTORY: In June, he woke up one morning and noted numbness on the left side of his lip and the ulnar aspect of his left hand.  There was no associated weakness or new symptoms other than his residual symptoms from his prior stroke (slurred speech, hypophonia, mild difficulty getting words out).  Since then, the lip numbness has resolved, but he continues to have numbness in the left hand.  It is unchanged.  He has history of stroke and carotid artery disease.  He underwent right CEA in 2005.  He was admitted to [facility] in February 2014 for stroke, presenting as slurred speech.  MRI of the brain revealed acute right posterior frontal infarct.  Carotid doppler revealed no hemodynamically significant stenosis in the right ICA.  2-D echo showed normal left ventricular size and function diastolic dysfunction grade 1, and mild right ventricular dilation with aortic bioprosthesis present and mild right ventricular dilation.  He was initially started on Plavix, but was later switched to Aggrenox due to Plavix interaction with Prozac.  Aggrenox was later switched to Coumadin by his cardiologist at [facility], due to his bioprosthetic valve.  He also continues to be on aspirin 81mg  daily.  He had an  MRI of the brain, as well as MRA of the head and neck, performed 04/22/13.  It revealed mild to moderate stenosis at the origin of the bilateral vertebral arteries and probable atherosclerotic narrowing of the bilateral carotid bulbs.  There was no acute infarction, but multiple foci of microhemorrhages were seen, raising the possibility of amyloid angiopathy.  He, too, recommended remaining on the Coumadin.  He most recently followed up with his vascular surgeon in March.  Carotid doppler performed at that time revealed no hemodynamically significant stenosis.  PAST MEDICAL HISTORY: Past Medical History  Diagnosis Date  . Hyperlipidemia   . Mitral valve prolapse   . Aortic valve regurgitation   . Degenerative disk disease   . Major depression   . Spinal stenosis   . Coronary artery disease     post stents; prior CABG; s/p cath January 2014 with 2 VD, patent SVG to [facility] and patent SVG to PL patent  . Hypertension   . Carotid artery occlusion   . Heart murmur   . Anginal pain   . History of esophageal stricture     "has it stretched q once in awhile" (03/26/2012"  . OSA (obstructive sleep apnea)     "have a mask; haven't used one in years" (03/26/2012)  . Type II diabetes mellitus   . Stroke 2014  . Kidney stones   . Skin cancer of nose 2012    "right side" (03/26/2012)  . Skin cancer of trunk     "left chest" (03/26/2012)  .  GERD (gastroesophageal reflux disease)     MEDICATIONS: Current Outpatient Prescriptions on File Prior to Visit  Medication Sig Dispense Refill  . amLODipine (NORVASC) 5 MG tablet Take by mouth daily. Take 1/2 Tab Daily     . aspirin 81 MG chewable tablet Chew 1 tablet (81 mg total) by mouth daily at 6 PM. 30 tablet 0  . FLUoxetine (PROZAC) 20 MG capsule Take 20 mg by mouth daily.    . isosorbide mononitrate (IMDUR) 60 MG 24 hr tablet Take 120 mg by mouth daily.    . Melatonin 3 MG TABS Take 1 tablet by mouth daily.    . metFORMIN (GLUMETZA) 1000 MG (MOD) 24 hr tablet  Take 1 tablet (1,000 mg total) by mouth 2 (two) times daily with a meal.    . metoprolol tartrate (LOPRESSOR) 25 MG tablet Take 25 mg by mouth 2 (two) times daily.    . Multiple Vitamin (MULTIVITAMIN PO) Take by mouth.      . nitroGLYCERIN (NITROSTAT) 0.4 MG SL tablet Place 1 tablet (0.4 mg total) under the tongue every 5 (five) minutes as needed. For chest pain 25 tablet 5  . omeprazole (PRILOSEC) 20 MG capsule Take 20 mg by mouth daily.      . rosuvastatin (CRESTOR) 10 MG tablet Take 10 mg by mouth daily.      . silodosin (RAPAFLO) 8 MG CAPS capsule Take 8 mg by mouth daily with breakfast.    . vitamin C (ASCORBIC ACID) 500 MG tablet Take 500 mg by mouth 2 (two) times daily.      [name] warfarin (COUMADIN) 5 MG tablet Take 5 mg by mouth every Wednesday and Saturday.    . warfarin (COUMADIN) 7.5 MG tablet Take 7.5 mg by mouth daily. Sun, Mon, Tues, Thurs, Fri     No current facility-administered medications on file prior to visit.    ALLERGIES: No Known Allergies  FAMILY HISTORY: Family History  Problem Relation Age of Onset  . Diabetes Mother   . Coronary artery disease Mother   . Heart disease Mother   . Stroke Father   . Cancer Daughter     lymphoma hodgkins   . Seizures Daughter     SOCIAL HISTORY: History   Social History  . Marital Status: Married    Spouse Name: N/A    Number of Children: N/A  . Years of Education: N/A   Occupational History  . Not on file.   Social History Main Topics  . Smoking status: Former Smoker -- 5.00 packs/day for 23 years    Types: Cigarettes    Quit date: 10/02/1978  . Smokeless tobacco: Never Used  . Alcohol Use: No     Comment: 03/26/2012 "last alcohol was in 1970; never had problem w/it"  . Drug Use: No  . Sexual Activity: No   Other Topics Concern  . Not on file   Social History Narrative  . No narrative on file    REVIEW OF SYSTEMS: Constitutional: No fevers, chills, or sweats, no generalized fatigue, change in  appetite Eyes: No visual changes, double vision, eye pain Ear, nose and throat: No hearing loss, ear pain, nasal congestion, sore throat Cardiovascular: No chest pain, palpitations Respiratory:  No shortness of breath at rest or with exertion, wheezes GastrointestinaI: No nausea, vomiting, diarrhea, abdominal pain, fecal incontinence Genitourinary:  No dysuria, urinary retention or frequency Musculoskeletal:  No neck pain, back pain Integumentary: No rash, pruritus, skin lesions Neurological: as above Psychiatric: No depression,  insomnia, anxiety Endocrine: No palpitations, fatigue, diaphoresis, mood swings, change in appetite, change in weight, increased thirst Hematologic/Lymphatic:  No anemia, purpura, petechiae. Allergic/Immunologic: no itchy/runny eyes, nasal congestion, recent allergic reactions, rashes  PHYSICAL EXAM: Filed Vitals:   02/20/14 1554  BP: 132/74  Pulse: 70  Temp: 97.7 F (36.5 C)  Resp: 16   General: No acute distress Head:  Normocephalic/atraumatic Eyes:  Fundoscopic exam unremarkable without vessel changes, exudates, hemorrhages or papilledema. Neck: supple, no paraspinal tenderness, full range of motion Heart:  Regular rate and rhythm Lungs:  Clear to auscultation bilaterally Back: No paraspinal tenderness Neurological Exam: alert and oriented to person, place, and time. Attention span and concentration intact, recent and remote memory intact, fund of knowledge intact.  Speech fluent and not dysarthric, language intact.  CN II-XII intact. Fundoscopic exam unremarkable without vessel changes, exudates, hemorrhages or papilledema.  Bulk and tone normal, muscle strength 5/5 throughout.  Sensation to light touch intact.  Deep tendon reflexes 2+ throughout.  Finger to nose and heel to shin testing intact.  Gait normal.  IMPRESSION: 1. Probable recent right parietal infarct, likely embolic from unknown source 2. History of right frontal ischemic infarct, likely  embolic from unknown source 3. Carotid artery disease status post left CEA 4. MVP status post bioprosthetic valve 5.  Amyloid angiopathy  PLAN: I will contact his cardiologist, Dr. Sharol Roussel, to discuss the need for Coumadin given his bioprosthetic valve.  With evidence of amyloid angiopathy, he is likely at much higher risk for a cerebral hemorrhage if on an anticoagulant.  I will contact Mr. Ventura after the discussion.  In the meantime, he will follow up in 6 months.  30 minutes spent with patient, over 50% spent discussing diagnoses and coordinating care.  Metta Clines, DO  CC:  Lavone Orn, MD  Shade Flood, MD

## 2014-02-20 NOTE — Patient Instructions (Signed)
I will contact your cardiologist regarding the need for Coumadin.  I will call you after I speak to him. Otherwise, follow up in 6 months.

## 2014-02-23 DIAGNOSIS — N401 Enlarged prostate with lower urinary tract symptoms: Secondary | ICD-10-CM | POA: Diagnosis not present

## 2014-02-23 DIAGNOSIS — R312 Other microscopic hematuria: Secondary | ICD-10-CM | POA: Diagnosis not present

## 2014-02-23 DIAGNOSIS — N3281 Overactive bladder: Secondary | ICD-10-CM | POA: Diagnosis not present

## 2014-02-24 ENCOUNTER — Telehealth: Payer: Self-pay | Admitting: Neurology

## 2014-02-24 NOTE — Telephone Encounter (Signed)
Yesterday, I left a message for [name] to call me back to discuss my conversation with Dr. Sheila Oats.  Yesterday, I discussed with his cardiologist, Dr. Sheila Oats, the findings of microhemorrhages on his MRI, suggesting possible amyloid angiopathy.  Ideally, he should not be on any blood thinner, but since he has had a prior ischemic stroke, we decided to discontinue the anticoagulation but remain on ASA.

## 2014-02-24 NOTE — Telephone Encounter (Signed)
MRI disc forwarded to [facility] by mail. To be uploaded into system. I have sent request to have disc returned to our office, the patient would like the disc mailed back to him / Sherri S.

## 2014-02-26 ENCOUNTER — Encounter (HOSPITAL_COMMUNITY): Payer: Self-pay | Admitting: Cardiology

## 2014-02-26 ENCOUNTER — Telehealth: Payer: Self-pay | Admitting: Neurology

## 2014-02-26 NOTE — Telephone Encounter (Signed)
Pt says he is returning Dr. Georgie Chard call from yesterday. CB# [phone] / Sherri S.

## 2014-03-10 ENCOUNTER — Telehealth: Payer: Self-pay | Admitting: Neurology

## 2014-03-10 ENCOUNTER — Telehealth: Payer: Self-pay | Admitting: *Deleted

## 2014-03-10 NOTE — Telephone Encounter (Signed)
Pt called f/u on to see if Dr. Tomi Likens spoke w/ Dr. Dellie Burns ptr's heart doctor. C/B [phone]

## 2014-03-10 NOTE — Telephone Encounter (Signed)
Patient is aware he can d/c coumadin and continue aspirin

## 2014-03-10 NOTE — Telephone Encounter (Signed)
Dr Tomi Likens see below call note from patient  And advise

## 2014-03-10 NOTE — Telephone Encounter (Signed)
Yes, I spoke with Dr. Sheila Oats.  After I spoke to him, I had left a message for Mr. Brinkmeyer to call us back.  He may discontinue Coumadin and remain on the aspirin daily.

## 2014-03-16 DIAGNOSIS — I2510 Atherosclerotic heart disease of native coronary artery without angina pectoris: Secondary | ICD-10-CM | POA: Diagnosis not present

## 2014-03-16 DIAGNOSIS — I10 Essential (primary) hypertension: Secondary | ICD-10-CM | POA: Diagnosis not present

## 2014-03-16 DIAGNOSIS — Z952 Presence of prosthetic heart valve: Secondary | ICD-10-CM | POA: Diagnosis not present

## 2014-03-16 DIAGNOSIS — Z9889 Other specified postprocedural states: Secondary | ICD-10-CM | POA: Diagnosis not present

## 2014-03-24 DIAGNOSIS — Z125 Encounter for screening for malignant neoplasm of prostate: Secondary | ICD-10-CM | POA: Diagnosis not present

## 2014-03-24 DIAGNOSIS — K219 Gastro-esophageal reflux disease without esophagitis: Secondary | ICD-10-CM | POA: Diagnosis not present

## 2014-03-24 DIAGNOSIS — F325 Major depressive disorder, single episode, in full remission: Secondary | ICD-10-CM | POA: Diagnosis not present

## 2014-03-24 DIAGNOSIS — Z09 Encounter for follow-up examination after completed treatment for conditions other than malignant neoplasm: Secondary | ICD-10-CM | POA: Diagnosis not present

## 2014-03-24 DIAGNOSIS — E1122 Type 2 diabetes mellitus with diabetic chronic kidney disease: Secondary | ICD-10-CM | POA: Diagnosis not present

## 2014-03-24 DIAGNOSIS — N182 Chronic kidney disease, stage 2 (mild): Secondary | ICD-10-CM | POA: Diagnosis not present

## 2014-03-24 DIAGNOSIS — I10 Essential (primary) hypertension: Secondary | ICD-10-CM | POA: Diagnosis not present

## 2014-03-24 DIAGNOSIS — Z1389 Encounter for screening for other disorder: Secondary | ICD-10-CM | POA: Diagnosis not present

## 2014-03-24 DIAGNOSIS — E780 Pure hypercholesterolemia: Secondary | ICD-10-CM | POA: Diagnosis not present

## 2014-03-30 DIAGNOSIS — H25013 Cortical age-related cataract, bilateral: Secondary | ICD-10-CM | POA: Diagnosis not present

## 2014-03-30 DIAGNOSIS — H2513 Age-related nuclear cataract, bilateral: Secondary | ICD-10-CM | POA: Diagnosis not present

## 2014-03-30 DIAGNOSIS — I7090 Unspecified atherosclerosis: Secondary | ICD-10-CM | POA: Diagnosis not present

## 2014-03-30 DIAGNOSIS — E119 Type 2 diabetes mellitus without complications: Secondary | ICD-10-CM | POA: Diagnosis not present

## 2014-05-07 ENCOUNTER — Other Ambulatory Visit: Payer: Medicare Other

## 2014-05-07 ENCOUNTER — Other Ambulatory Visit: Payer: Self-pay | Admitting: Internal Medicine

## 2014-05-07 DIAGNOSIS — M5489 Other dorsalgia: Secondary | ICD-10-CM

## 2014-05-07 DIAGNOSIS — M5431 Sciatica, right side: Secondary | ICD-10-CM | POA: Diagnosis not present

## 2014-05-07 DIAGNOSIS — M79604 Pain in right leg: Secondary | ICD-10-CM

## 2014-05-10 ENCOUNTER — Ambulatory Visit
Admission: RE | Admit: 2014-05-10 | Discharge: 2014-05-10 | Disposition: A | Discharge status: Home / Self Care | Payer: Medicare Other | Source: Ambulatory Visit | Attending: Internal Medicine | Admitting: Internal Medicine

## 2014-05-10 DIAGNOSIS — M5489 Other dorsalgia: Secondary | ICD-10-CM

## 2014-05-10 DIAGNOSIS — M79604 Pain in right leg: Secondary | ICD-10-CM

## 2014-05-10 DIAGNOSIS — M4806 Spinal stenosis, lumbar region: Secondary | ICD-10-CM | POA: Diagnosis not present

## 2014-05-10 DIAGNOSIS — M4326 Fusion of spine, lumbar region: Secondary | ICD-10-CM | POA: Diagnosis not present

## 2014-05-10 DIAGNOSIS — M5137 Other intervertebral disc degeneration, lumbosacral region: Secondary | ICD-10-CM | POA: Diagnosis not present

## 2014-05-28 DIAGNOSIS — Z6827 Body mass index (BMI) 27.0-27.9, adult: Secondary | ICD-10-CM | POA: Diagnosis not present

## 2014-05-28 DIAGNOSIS — I10 Essential (primary) hypertension: Secondary | ICD-10-CM | POA: Diagnosis not present

## 2014-05-29 DIAGNOSIS — M5417 Radiculopathy, lumbosacral region: Secondary | ICD-10-CM | POA: Diagnosis not present

## 2014-05-29 DIAGNOSIS — M47816 Spondylosis without myelopathy or radiculopathy, lumbar region: Secondary | ICD-10-CM | POA: Diagnosis not present

## 2014-05-29 DIAGNOSIS — M5416 Radiculopathy, lumbar region: Secondary | ICD-10-CM | POA: Diagnosis not present

## 2014-06-30 DIAGNOSIS — M6281 Muscle weakness (generalized): Secondary | ICD-10-CM | POA: Diagnosis not present

## 2014-06-30 DIAGNOSIS — E1122 Type 2 diabetes mellitus with diabetic chronic kidney disease: Secondary | ICD-10-CM | POA: Diagnosis not present

## 2014-06-30 DIAGNOSIS — I10 Essential (primary) hypertension: Secondary | ICD-10-CM | POA: Diagnosis not present

## 2014-07-21 DIAGNOSIS — N3281 Overactive bladder: Secondary | ICD-10-CM | POA: Diagnosis not present

## 2014-07-21 DIAGNOSIS — N401 Enlarged prostate with lower urinary tract symptoms: Secondary | ICD-10-CM | POA: Diagnosis not present

## 2014-07-21 DIAGNOSIS — N138 Other obstructive and reflux uropathy: Secondary | ICD-10-CM | POA: Diagnosis not present

## 2014-07-23 DIAGNOSIS — Z6827 Body mass index (BMI) 27.0-27.9, adult: Secondary | ICD-10-CM | POA: Diagnosis not present

## 2014-07-23 DIAGNOSIS — I10 Essential (primary) hypertension: Secondary | ICD-10-CM | POA: Diagnosis not present

## 2014-07-23 DIAGNOSIS — M5416 Radiculopathy, lumbar region: Secondary | ICD-10-CM | POA: Diagnosis not present

## 2014-08-21 ENCOUNTER — Encounter: Payer: Self-pay | Admitting: Neurology

## 2014-08-21 ENCOUNTER — Ambulatory Visit (INDEPENDENT_AMBULATORY_CARE_PROVIDER_SITE_OTHER): Payer: Medicare Other | Admitting: Neurology

## 2014-08-21 VITALS — BP 112/60 | HR 72 | Ht 66.0 in | Wt 172.0 lb

## 2014-08-21 DIAGNOSIS — I739 Peripheral vascular disease, unspecified: Secondary | ICD-10-CM

## 2014-08-21 DIAGNOSIS — I680 Cerebral amyloid angiopathy: Secondary | ICD-10-CM

## 2014-08-21 DIAGNOSIS — E1142 Type 2 diabetes mellitus with diabetic polyneuropathy: Secondary | ICD-10-CM | POA: Diagnosis not present

## 2014-08-21 DIAGNOSIS — E119 Type 2 diabetes mellitus without complications: Secondary | ICD-10-CM | POA: Insufficient documentation

## 2014-08-21 DIAGNOSIS — I639 Cerebral infarction, unspecified: Secondary | ICD-10-CM | POA: Diagnosis not present

## 2014-08-21 DIAGNOSIS — E1165 Type 2 diabetes mellitus with hyperglycemia: Secondary | ICD-10-CM | POA: Insufficient documentation

## 2014-08-21 DIAGNOSIS — I779 Disorder of arteries and arterioles, unspecified: Secondary | ICD-10-CM

## 2014-08-21 DIAGNOSIS — Z953 Presence of xenogenic heart valve: Secondary | ICD-10-CM | POA: Diagnosis not present

## 2014-08-21 DIAGNOSIS — E854 Organ-limited amyloidosis: Secondary | ICD-10-CM | POA: Insufficient documentation

## 2014-08-21 NOTE — Progress Notes (Signed)
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  MRI of lumbar spine reviewed.  UPDATE: Imaging showed evidence of cerebral amyloid angiopathy.  After discussion with Dr. Sheila Oats, his cardiologist, it was decided to discontinue anticoagulation but to remain on ASA.  He reports a new issue.  Since the end of January, he notes weakness in his legs, particularly his right leg.  His legs feel achy.  He has known numbness and tingling in the feet due to his diabetic neuropathy.  He denies any new back pain associated with it.  He has BPH but denies any new bladder dysfunction or bowel dysfunction.  MRI of the lumbar spine performed on 05/10/14 showed no significant stenosis.  His PCP told him to monitor his blood sugars, which he hasn't been doing.  His last A1c was reportedly around 8.  HISTORY: In June, he woke up one morning and noted numbness on the left side of his lip and the ulnar aspect of his left hand.  There was no associated weakness or new symptoms other than his residual symptoms from his prior stroke (slurred speech, hypophonia, mild difficulty getting words out).  Since then, the lip numbness has resolved, but he continues to have numbness in the left hand.  It is unchanged.  He has history of stroke and carotid artery disease.  He underwent right CEA in 2005.  He was admitted to [facility] in February 2014 for stroke, presenting as slurred speech.  MRI of the brain revealed acute right posterior frontal infarct.  Carotid doppler revealed no hemodynamically significant stenosis in the right ICA.  2-D echo showed normal left ventricular size  and function diastolic dysfunction grade 1, and mild right ventricular dilation with aortic bioprosthesis present and mild right ventricular dilation.  He was initially started on Plavix, but was later switched to Aggrenox due to Plavix interaction with Prozac.  Aggrenox was later switched to Coumadin by his cardiologist at [facility], due to his bioprosthetic valve.  He also continues to be on aspirin 81mg  daily.  He had an MRI of the brain, as well as MRA of the head and neck, performed 04/22/13.  It revealed mild to moderate stenosis at the origin of the bilateral vertebral arteries and probable atherosclerotic narrowing of the bilateral carotid bulbs.  There was no acute infarction, but multiple foci of microhemorrhages were seen, raising the possibility of amyloid angiopathy.  He, too, recommended remaining on the Coumadin.  He most recently followed up with his vascular surgeon in March.  Carotid doppler performed at that time revealed no hemodynamically significant stenosis.  PAST MEDICAL HISTORY: Past Medical History  Diagnosis Date  . Hyperlipidemia   . Mitral valve prolapse   . Aortic valve regurgitation   . Degenerative disk disease   . Major depression   . Spinal stenosis   . Coronary artery disease     post stents; prior CABG; s/p cath January 2014 with 2 VD, patent SVG to [facility] and patent SVG to PL patent  . Hypertension   . Carotid artery occlusion   . Heart murmur   . Anginal pain   . History of esophageal stricture     "  has it stretched q once in awhile" (03/26/2012"  . OSA (obstructive sleep apnea)     "have a mask; haven't used one in years" (03/26/2012)  . Type II diabetes mellitus   . Stroke 2014  . Kidney stones   . Skin cancer of nose 2012    "right side" (03/26/2012)  . Skin cancer of trunk     "left chest" (03/26/2012)  . GERD (gastroesophageal reflux disease)     MEDICATIONS: Current Outpatient Prescriptions on File Prior to Visit  Medication Sig Dispense Refill  .  amLODipine (NORVASC) 5 MG tablet Take by mouth daily. Take 1/2 Tab Daily     . FLUoxetine (PROZAC) 20 MG capsule Take 20 mg by mouth daily.    . Melatonin 3 MG TABS Take 1 tablet by mouth daily.    . metFORMIN (GLUMETZA) 1000 MG (MOD) 24 hr tablet Take 1 tablet (1,000 mg total) by mouth 2 (two) times daily with a meal.    . metoprolol tartrate (LOPRESSOR) 25 MG tablet Take 25 mg by mouth 2 (two) times daily. 1/2 tablet in the morning, 1 tablet in the evening    . Multiple Vitamin (MULTIVITAMIN PO) Take by mouth.      [name] omeprazole (PRILOSEC) 20 MG capsule Take 20 mg by mouth daily.      . rosuvastatin (CRESTOR) 10 MG tablet Take 10 mg by mouth daily.      . silodosin (RAPAFLO) 8 MG CAPS capsule Take 8 mg by mouth daily with breakfast.    . vitamin C (ASCORBIC ACID) 500 MG tablet Take 500 mg by mouth 2 (two) times daily.      . nitroGLYCERIN (NITROSTAT) 0.4 MG SL tablet Place 1 tablet (0.4 mg total) under the tongue every 5 (five) minutes as needed. For chest pain (Patient not taking: Reported on 08/21/2014) 25 tablet 5   No current facility-administered medications on file prior to visit.    ALLERGIES: No Known Allergies  FAMILY HISTORY: Family History  Problem Relation Age of Onset  . Diabetes Mother   . Coronary artery disease Mother   . Heart disease Mother   . Stroke Father   . Cancer Daughter     lymphoma hodgkins   . Seizures Daughter     SOCIAL HISTORY: History   Social History  . Marital Status: Married    Spouse Name: N/A  . Number of Children: N/A  . Years of Education: N/A   Occupational History  . Not on file.   Social History Main Topics  . Smoking status: Former Smoker -- 5.00 packs/day for 23 years    Types: Cigarettes    Quit date: 10/02/1978  . Smokeless tobacco: Never Used  . Alcohol Use: No     Comment: 03/26/2012 "last alcohol was in 1970; never had problem w/it"  . Drug Use: No  . Sexual Activity: No   Other Topics Concern  . Not on file   Social  History Narrative    REVIEW OF SYSTEMS: Constitutional: No fevers, chills, or sweats, no generalized fatigue, change in appetite Eyes: No visual changes, double vision, eye pain Ear, nose and throat: No hearing loss, ear pain, nasal congestion, sore throat Cardiovascular: No chest pain, palpitations Respiratory:  No shortness of breath at rest or with exertion, wheezes GastrointestinaI: No nausea, vomiting, diarrhea, abdominal pain, fecal incontinence Genitourinary:  No dysuria, urinary retention or frequency Musculoskeletal:  No neck pain, back pain Integumentary: No rash, pruritus, skin lesions Neurological: as above Psychiatric:  No depression, insomnia, anxiety Endocrine: No palpitations, fatigue, diaphoresis, mood swings, change in appetite, change in weight, increased thirst Hematologic/Lymphatic:  No anemia, purpura, petechiae. Allergic/Immunologic: no itchy/runny eyes, nasal congestion, recent allergic reactions, rashes  PHYSICAL EXAM: Filed Vitals:   08/21/14 1247  BP: 112/60  Pulse: 72   General: No acute distress Head:  Normocephalic/atraumatic Eyes:  Fundoscopic exam unremarkable without vessel changes, exudates, hemorrhages or papilledema. Neck: supple, no paraspinal tenderness, full range of motion Heart:  Regular rate and rhythm Lungs:  Clear to auscultation bilaterally Back: No paraspinal tenderness Neurological Exam: alert and oriented to person, place, and time. Attention span and concentration intact, recent and remote memory intact, fund of knowledge intact.  Speech mildly dysfluent and not dysarthric, language intact.  CN II-XII intact. Fundoscopic exam unremarkable without vessel changes, exudates, hemorrhages or papilledema.  Bulk and tone normal, muscle strength 5-/5 right hip flexion.  Otherwise, 5/5.  Sensation to pinprick and vibration reduced up to the knees.    Deep tendon reflexes 2+ throughout, except absent in ankles, toes downgoing.  Finger to nose and  heel to shin testing intact.  Gait steady. Romberg negative.  IMPRESSION: Right leg weakness, possibly lumbosacral radiculopathy or mild diabetic amyotrophy CVA Carotid artery disease MVP status post bioprosthetic valve Cerebral amyloid angiopathy  PLAN: Will check NCV-EMG of right lower extremity Advised to monitor sugars and optimize glycemic control ASA Follow up in 3 months.  Metta Clines, DO  CC:  Lavone Orn, MD

## 2014-08-21 NOTE — Addendum Note (Signed)
Addended by: Ella Jubilee on: 08/21/2014 02:12 PM   Modules accepted: Orders

## 2014-08-21 NOTE — Patient Instructions (Signed)
Continue aspirin Will schedule for a nerve conduction study of the right leg Make sure to get your diabetes under control Follow up in 3 months.

## 2014-09-14 ENCOUNTER — Other Ambulatory Visit: Payer: Self-pay

## 2014-09-22 ENCOUNTER — Ambulatory Visit (INDEPENDENT_AMBULATORY_CARE_PROVIDER_SITE_OTHER): Payer: Medicare Other | Admitting: Neurology

## 2014-09-22 DIAGNOSIS — E1142 Type 2 diabetes mellitus with diabetic polyneuropathy: Secondary | ICD-10-CM

## 2014-09-22 DIAGNOSIS — I639 Cerebral infarction, unspecified: Secondary | ICD-10-CM

## 2014-09-22 DIAGNOSIS — M5417 Radiculopathy, lumbosacral region: Secondary | ICD-10-CM

## 2014-09-22 DIAGNOSIS — I739 Peripheral vascular disease, unspecified: Secondary | ICD-10-CM

## 2014-09-22 DIAGNOSIS — Z953 Presence of xenogenic heart valve: Secondary | ICD-10-CM

## 2014-09-22 DIAGNOSIS — I680 Cerebral amyloid angiopathy: Secondary | ICD-10-CM

## 2014-09-22 DIAGNOSIS — E854 Organ-limited amyloidosis: Secondary | ICD-10-CM

## 2014-09-22 DIAGNOSIS — I779 Disorder of arteries and arterioles, unspecified: Secondary | ICD-10-CM

## 2014-09-22 NOTE — Procedures (Signed)
[facility] Neurology  [place], [place]  [place], [place] [address] Tel: [phone] Fax:  [phone] Test Date:  09/22/2014  Patient: [name] DOB: 1935/06/17 Physician: Narda Amber, DO  Sex: Male Height: 5\' 6"  Ref Phys: Metta Clines, M.D.  ID#: [id number] Temp:  Technician: Jerilynn Mages. Dean   Patient Complaints: This is a 79 year old gentleman presenting for evaluation of bilateral leg weakness and numbness, worse on the right.  NCV & EMG Findings: Extensive electrodiagnostic testing of the right lower extremity and additional studies of the left shows:  1. Bilateral sural and superficial peroneal sensory responses are absent. 2. Right tibial motor response is borderline low. Right peroneal and left tibial motor responses are within normal limits. 3. Chronic motor axon loss changes are seen affecting the right tibialis anterior, medial gastrocnemius, flexor digitorum longus, and gluteus medius muscles. Similar findings are seen in the left lower extremity with the additional chronic motor axon loss changes affecting L3-L4 myotomes.  Impression: 1. The electrophysiologic findings are most consistent with a generalized sensorimotor polyneuropathy, axon loss in type, affecting the lower extremities.  2. There is also evidence of a superimposed bilateral L5 radiculopathy and left L3-L4 radiculopathy; mild to moderate in degree electrically.    ___________________________ Narda Amber, DO    Nerve Conduction Studies Anti Sensory Summary Table   Site NR Peak (ms) Norm Peak (ms) P-T Amp (V) Norm P-T Amp  Left Sup Peroneal Anti Sensory (Ant Lat Mall)  12 cm NR  <4.6  >3  Right Sup Peroneal Anti Sensory (Ant Lat Mall)  12 cm NR  <4.6  >3  Left Sural Anti Sensory (Lat Mall)  Calf NR  <4.6  >3  Right Sural Anti Sensory (Lat Mall)  Calf NR  <4.6  >3   Motor Summary Table   Site NR Onset (ms) Norm Onset (ms) O-P Amp (mV) Norm O-P Amp Site1 Site2 Delta-0 (ms) Dist (cm) Vel  (m/s) Norm Vel (m/s)  Right Peroneal Motor (Ext Dig Brev)  Ankle    3.7 <6.0 2.6 >2.5 B Fib Ankle 7.2 31.0 43 >40  B Fib    10.9  2.4  Poplt B Fib 2.1 10.0 48 >40  Poplt    13.0  2.2         Left Tibial Motor (Abd Hall Brev)  Ankle    5.7 <6.0 5.8 >4 Knee Ankle 10.2 41.0 40 >40  Knee    15.9  3.9         Right Tibial Motor (Abd Hall Brev)  Ankle    3.0 <6.0 3.9 >4 Knee Ankle 9.5 42.0 44 >40  Knee    12.5  2.0          H Reflex Studies   NR H-Lat (ms) Lat Norm (ms) L-R H-Lat (ms)  Right Tibial (Gastroc)     33.47 <35    EMG   Side Muscle Ins Act Fibs Psw Fasc Number Recrt Dur Dur. Amp Amp. Poly Poly. Comment  Right AntTibialis Nml Nml Nml Nml 1- Rapid Some 1+ Some 1+ Nml Nml N/A  Right Gastroc Nml Nml Nml Nml 1- Mod-R Some 1+ Some 1+ Nml Nml N/A  Right Flex Dig Long Nml Nml Nml Nml 1- Mod-R Some 1+ Some 1+ Nml Nml N/A  Right RectFemoris Nml Nml Nml Nml Nml Nml Nml Nml Nml Nml Nml Nml N/A  Right GluteusMed Nml Nml Nml Nml 1- Mod-R Some 1+ Some 1+ Nml Nml N/A  Right BicepsFemS Nml  Nml Nml Nml Nml Nml Nml Nml Nml Nml Nml Nml N/A  Left AntTibialis Nml Nml Nml Nml 1- Rapid Some 1+ Some 1+ Nml Nml N/A  Left Gastroc Nml Nml Nml Nml 1- Rapid Some 1+ Nml Nml Nml Nml N/A  Left RectFemoris Nml Nml Nml Nml 1- Mod-R Some 1+ Nml Nml Nml Nml N/A  Left AdductorLong Nml Nml Nml Nml 1- Mod-R Some 1+ Nml Nml Nml Nml N/A  Left BicepsFemS Nml Nml Nml Nml Nml Nml Nml Nml Nml Nml Nml Nml N/A  Left GluteusMed Nml Nml Nml Nml 1- Rapid Some 1+ Some 1+ Nml Nml N/A      Waveforms:

## 2014-09-25 ENCOUNTER — Telehealth: Payer: Self-pay | Admitting: Neurology

## 2014-09-25 ENCOUNTER — Telehealth: Payer: Self-pay | Admitting: *Deleted

## 2014-09-25 NOTE — Telephone Encounter (Signed)
Pt is returning your call he state he will be in court until after you leave so please call and give the test results to his wife

## 2014-09-25 NOTE — Telephone Encounter (Signed)
Left a message for patient to return my call NCV-EMG shows evidence of neuropathy (from diabetes) and old findings of nerve issues from the back, but nothing acute. If necessary, we can refer him to PT for leg weakness.

## 2014-09-25 NOTE — Telephone Encounter (Signed)
-----   Message from Pieter Partridge, DO sent at 09/23/2014  2:49 PM EDT ----- NCV-EMG shows evidence of neuropathy (from diabetes) and old findings of nerve issues from the back, but nothing acute.  If necessary, we can refer him to PT for leg weakness.

## 2014-09-25 NOTE — Telephone Encounter (Signed)
Pt is returning your  call  And states that he will call you back

## 2014-09-28 ENCOUNTER — Telehealth: Payer: Self-pay | Admitting: *Deleted

## 2014-09-28 NOTE — Telephone Encounter (Signed)
-----   Message from Pieter Partridge, DO sent at 09/23/2014  2:49 PM EDT ----- NCV-EMG shows evidence of neuropathy (from diabetes) and old findings of nerve issues from the back, but nothing acute.  If necessary, we can refer him to PT for leg weakness.

## 2014-09-28 NOTE — Telephone Encounter (Signed)
Patient is aware NCV-EMG shows evidence of neuropathy (from diabetes) and old findings of nerve issues from the back, but nothing acute. If necessary, we can refer him to PT for leg weakness. However he wishes to try PT on his own at home first

## 2014-10-01 NOTE — Telephone Encounter (Signed)
Patient is aware of results.

## 2014-10-01 NOTE — Telephone Encounter (Signed)
Please see below - can encounter be closed?

## 2014-10-12 DIAGNOSIS — I10 Essential (primary) hypertension: Secondary | ICD-10-CM | POA: Diagnosis not present

## 2014-10-12 DIAGNOSIS — E1122 Type 2 diabetes mellitus with diabetic chronic kidney disease: Secondary | ICD-10-CM | POA: Diagnosis not present

## 2014-10-12 DIAGNOSIS — N182 Chronic kidney disease, stage 2 (mild): Secondary | ICD-10-CM | POA: Diagnosis not present

## 2014-10-12 DIAGNOSIS — N400 Enlarged prostate without lower urinary tract symptoms: Secondary | ICD-10-CM | POA: Diagnosis not present

## 2014-10-12 DIAGNOSIS — M6281 Muscle weakness (generalized): Secondary | ICD-10-CM | POA: Diagnosis not present

## 2014-11-10 DIAGNOSIS — R3915 Urgency of urination: Secondary | ICD-10-CM | POA: Diagnosis not present

## 2014-11-10 DIAGNOSIS — N3281 Overactive bladder: Secondary | ICD-10-CM | POA: Diagnosis not present

## 2014-12-07 DIAGNOSIS — I2510 Atherosclerotic heart disease of native coronary artery without angina pectoris: Secondary | ICD-10-CM | POA: Diagnosis not present

## 2014-12-07 DIAGNOSIS — I6340 Cerebral infarction due to embolism of unspecified cerebral artery: Secondary | ICD-10-CM | POA: Diagnosis not present

## 2014-12-07 DIAGNOSIS — Z952 Presence of prosthetic heart valve: Secondary | ICD-10-CM | POA: Diagnosis not present

## 2014-12-07 DIAGNOSIS — I371 Nonrheumatic pulmonary valve insufficiency: Secondary | ICD-10-CM | POA: Diagnosis not present

## 2014-12-07 DIAGNOSIS — I2584 Coronary atherosclerosis due to calcified coronary lesion: Secondary | ICD-10-CM | POA: Diagnosis not present

## 2014-12-07 DIAGNOSIS — I10 Essential (primary) hypertension: Secondary | ICD-10-CM | POA: Diagnosis not present

## 2014-12-07 DIAGNOSIS — Z9889 Other specified postprocedural states: Secondary | ICD-10-CM | POA: Diagnosis not present

## 2014-12-07 DIAGNOSIS — Z951 Presence of aortocoronary bypass graft: Secondary | ICD-10-CM | POA: Diagnosis not present

## 2014-12-11 ENCOUNTER — Ambulatory Visit: Payer: Medicare Other | Admitting: Neurology

## 2014-12-21 DIAGNOSIS — G4733 Obstructive sleep apnea (adult) (pediatric): Secondary | ICD-10-CM | POA: Diagnosis not present

## 2014-12-21 DIAGNOSIS — Z23 Encounter for immunization: Secondary | ICD-10-CM | POA: Diagnosis not present

## 2014-12-22 DIAGNOSIS — N401 Enlarged prostate with lower urinary tract symptoms: Secondary | ICD-10-CM | POA: Diagnosis not present

## 2014-12-22 DIAGNOSIS — N3281 Overactive bladder: Secondary | ICD-10-CM | POA: Diagnosis not present

## 2014-12-22 DIAGNOSIS — N138 Other obstructive and reflux uropathy: Secondary | ICD-10-CM | POA: Diagnosis not present

## 2015-01-12 DIAGNOSIS — E1122 Type 2 diabetes mellitus with diabetic chronic kidney disease: Secondary | ICD-10-CM | POA: Diagnosis not present

## 2015-01-12 DIAGNOSIS — I10 Essential (primary) hypertension: Secondary | ICD-10-CM | POA: Diagnosis not present

## 2015-01-12 DIAGNOSIS — N400 Enlarged prostate without lower urinary tract symptoms: Secondary | ICD-10-CM | POA: Diagnosis not present

## 2015-01-12 DIAGNOSIS — N182 Chronic kidney disease, stage 2 (mild): Secondary | ICD-10-CM | POA: Diagnosis not present

## 2015-02-08 DIAGNOSIS — N401 Enlarged prostate with lower urinary tract symptoms: Secondary | ICD-10-CM | POA: Diagnosis not present

## 2015-02-08 DIAGNOSIS — N138 Other obstructive and reflux uropathy: Secondary | ICD-10-CM | POA: Diagnosis not present

## 2015-02-08 DIAGNOSIS — N3281 Overactive bladder: Secondary | ICD-10-CM | POA: Diagnosis not present

## 2015-02-09 ENCOUNTER — Encounter (HOSPITAL_COMMUNITY): Payer: Self-pay | Admitting: Emergency Medicine

## 2015-02-09 ENCOUNTER — Emergency Department (HOSPITAL_COMMUNITY)
Admission: EM | Admit: 2015-02-09 | Discharge: 2015-02-09 | Disposition: A | Discharge status: Home / Self Care | Payer: Medicare Other | Attending: Emergency Medicine | Admitting: Emergency Medicine

## 2015-02-09 DIAGNOSIS — Z7984 Long term (current) use of oral hypoglycemic drugs: Secondary | ICD-10-CM | POA: Diagnosis not present

## 2015-02-09 DIAGNOSIS — R109 Unspecified abdominal pain: Secondary | ICD-10-CM | POA: Insufficient documentation

## 2015-02-09 DIAGNOSIS — Z9861 Coronary angioplasty status: Secondary | ICD-10-CM | POA: Insufficient documentation

## 2015-02-09 DIAGNOSIS — N3281 Overactive bladder: Secondary | ICD-10-CM | POA: Diagnosis not present

## 2015-02-09 DIAGNOSIS — Z8673 Personal history of transient ischemic attack (TIA), and cerebral infarction without residual deficits: Secondary | ICD-10-CM | POA: Diagnosis not present

## 2015-02-09 DIAGNOSIS — Z79899 Other long term (current) drug therapy: Secondary | ICD-10-CM | POA: Insufficient documentation

## 2015-02-09 DIAGNOSIS — K219 Gastro-esophageal reflux disease without esophagitis: Secondary | ICD-10-CM | POA: Diagnosis not present

## 2015-02-09 DIAGNOSIS — I10 Essential (primary) hypertension: Secondary | ICD-10-CM | POA: Insufficient documentation

## 2015-02-09 DIAGNOSIS — Z951 Presence of aortocoronary bypass graft: Secondary | ICD-10-CM | POA: Insufficient documentation

## 2015-02-09 DIAGNOSIS — Z85828 Personal history of other malignant neoplasm of skin: Secondary | ICD-10-CM | POA: Insufficient documentation

## 2015-02-09 DIAGNOSIS — Z9889 Other specified postprocedural states: Secondary | ICD-10-CM | POA: Diagnosis not present

## 2015-02-09 DIAGNOSIS — Z87442 Personal history of urinary calculi: Secondary | ICD-10-CM | POA: Insufficient documentation

## 2015-02-09 DIAGNOSIS — E119 Type 2 diabetes mellitus without complications: Secondary | ICD-10-CM | POA: Insufficient documentation

## 2015-02-09 DIAGNOSIS — Z87891 Personal history of nicotine dependence: Secondary | ICD-10-CM | POA: Diagnosis not present

## 2015-02-09 DIAGNOSIS — F329 Major depressive disorder, single episode, unspecified: Secondary | ICD-10-CM | POA: Insufficient documentation

## 2015-02-09 DIAGNOSIS — E785 Hyperlipidemia, unspecified: Secondary | ICD-10-CM | POA: Insufficient documentation

## 2015-02-09 DIAGNOSIS — R339 Retention of urine, unspecified: Secondary | ICD-10-CM

## 2015-02-09 DIAGNOSIS — Z8669 Personal history of other diseases of the nervous system and sense organs: Secondary | ICD-10-CM | POA: Diagnosis not present

## 2015-02-09 DIAGNOSIS — R011 Cardiac murmur, unspecified: Secondary | ICD-10-CM | POA: Diagnosis not present

## 2015-02-09 DIAGNOSIS — N401 Enlarged prostate with lower urinary tract symptoms: Secondary | ICD-10-CM | POA: Diagnosis not present

## 2015-02-09 DIAGNOSIS — I25119 Atherosclerotic heart disease of native coronary artery with unspecified angina pectoris: Secondary | ICD-10-CM | POA: Diagnosis not present

## 2015-02-09 DIAGNOSIS — R3129 Other microscopic hematuria: Secondary | ICD-10-CM | POA: Diagnosis not present

## 2015-02-09 LAB — URINALYSIS, ROUTINE W REFLEX MICROSCOPIC
BILIRUBIN URINE: NEGATIVE
Glucose, UA: NEGATIVE mg/dL
HGB URINE DIPSTICK: NEGATIVE
Ketones, ur: NEGATIVE mg/dL
Leukocytes, UA: NEGATIVE
NITRITE: NEGATIVE
PROTEIN: NEGATIVE mg/dL
Specific Gravity, Urine: 1.02 (ref 1.005–1.030)
pH: 6.5 (ref 5.0–8.0)

## 2015-02-09 NOTE — ED Provider Notes (Signed)
CSN: [id number]     Arrival date & time 02/09/15  1835 History   First MD Initiated Contact with Patient 02/09/15 1858     Chief Complaint  Patient presents with  . Urinary Retention     (Consider location/radiation/quality/duration/timing/severity/associated sxs/prior Treatment) HPI   79 year old male with urinary retention. Refer to emergency room by his urologist. Patient was actually just evaluated by urology who recommended catheter placement, but patient declined. He continued to have increasing abdominal pain and inability to void so came to the emergency room. Actually evaluated him shortly after Was placed by nursing. He now feels significantly better. He has not noticed any blood in his urine recently. No dysuria. No fevers or chills.  Past Medical History  Diagnosis Date  . Hyperlipidemia   . Mitral valve prolapse   . Aortic valve regurgitation   . Degenerative disk disease   . Major depression ([place])   . Spinal stenosis   . Coronary artery disease     post stents; prior CABG; s/p cath January 2014 with 2 VD, patent SVG to [facility] and patent SVG to PL patent  . Hypertension   . Carotid artery occlusion   . Heart murmur   . Anginal pain ([place])   . History of esophageal stricture     "has it stretched q once in awhile" (03/26/2012"  . OSA (obstructive sleep apnea)     "have a mask; haven't used one in years" (03/26/2012)  . Type II diabetes mellitus ([place])   . Stroke ([place]) 2014  . Kidney stones   . Skin cancer of nose 2012    "right side" (03/26/2012)  . Skin cancer of trunk     "left chest" (03/26/2012)  . GERD (gastroesophageal reflux disease)    Past Surgical History  Procedure Laterality Date  . Adenoidectomy  1956  . Lumbar disc surgery  1996  . Carotid endarterectomy  05/05/2003    right carotid endarterectomy  . Shoulder arthroscopy  01/07/2007    Left shoulder impingement, labral tear  . Cardiac catheterization  10/20/2003    Est. EF of 65% -- Critical disease in  the mid to distal circumflex --  Preserved left ventricular  function --   . Coronary angioplasty with stent placement  10/20/2003    Percutaneous coronary intervention/drug-eluding stent implantation, third marginal branch --  Percutaneous closure, right femoral artery -- Atherosclerotic coronary vascular disease, single vessel / Status post successful percutaneous coronary intervention/tandem drug -- eluding stent implantation proximal third marginal branch -- Typical angina was not reproduced with device insertion or balloon   . Hematoma evacuation  05/09/2003    Evacuation of hematoma, right neck  . Coronary artery bypass graft  08/29/2010    CABG X2; SVG to PDA, SVG to [place].  . Aortic valve replacement  08/29/2010    25 mm CE Magna bioprosthetic - [facility]  . Mitral valve repair  08/23/2010    28 mm Simulus ring -[facility]  . Tonsillectomy and adenoidectomy  1956  . Appendectomy  1951  . Posterior fusion lumbar spine  1997  . Cervical disc surgery  1980's  . Anterior cervical decomp/discectomy fusion  2000's  . Skin cancer excision  1990's; 2012    "left chest & right nose" (03/26/2012)  . Cardiac valve replacement    . Tee without cardioversion N/A 05/15/2012    Procedure: TRANSESOPHAGEAL ECHOCARDIOGRAM (TEE);  Surgeon: Thayer Headings, MD;  Location: [facility];  Service: Cardiovascular;  Laterality: N/A;  .  Left heart catheterization with coronary angiogram N/A 03/27/2012    Procedure: LEFT HEART CATHETERIZATION WITH CORONARY ANGIOGRAM;  Surgeon: Peter M Martinique, MD;  Location[facility] CATH LAB;  Service: Cardiovascular;  Laterality: N/A;   Family History  Problem Relation Age of Onset  . Diabetes Mother   . Coronary artery disease Mother   . Heart disease Mother   . Stroke Father   . Cancer Daughter     lymphoma hodgkins   . Seizures Daughter    Social History  Substance Use Topics  . Smoking status: Former Smoker -- 5.00 packs/day for 23 years    Types: Cigarettes    Quit date:  10/02/1978  . Smokeless tobacco: Never Used  . Alcohol Use: No     Comment: 03/26/2012 "last alcohol was in 1970; never had problem w/it"    Review of Systems  All systems reviewed and negative, other than as noted in HPI.   Allergies  Review of patient's allergies indicates no known allergies.  Home Medications   Prior to Admission medications   Medication Sig Start Date End Date Taking? Authorizing Provider  amLODipine (NORVASC) 5 MG tablet Take 2.5 mg by mouth every evening. Take 1/2 Tab Daily   Yes Historical Provider, MD  aspirin 325 MG tablet Take 325 mg by mouth daily.   Yes Historical Provider, MD  finasteride (PROSCAR) 5 MG tablet Take 5 mg by mouth daily.   Yes Historical Provider, MD  FLUoxetine (PROZAC) 20 MG capsule Take 20 mg by mouth at bedtime.    Yes Historical Provider, MD  Melatonin 3 MG TABS Take 1 tablet by mouth daily.   Yes Historical Provider, MD  metFORMIN (GLUMETZA) 1000 MG (MOD) 24 hr tablet Take 1 tablet (1,000 mg total) by mouth 2 (two) times daily with a meal. 03/29/12  Yes Roger A Arguello, PA-C  metoprolol tartrate (LOPRESSOR) 25 MG tablet Take 25 mg by mouth 2 (two) times daily. 1/2 tablet in the morning, 1 tablet in the evening   Yes Historical Provider, MD  Multiple Vitamin (MULTIVITAMIN PO) Take by mouth.     Yes Historical Provider, MD  nitroGLYCERIN (NITROSTAT) 0.4 MG SL tablet Place 1 tablet (0.4 mg total) under the tongue every 5 (five) minutes as needed. For chest pain 03/28/12  Yes Thayer Headings, MD  omeprazole (PRILOSEC) 20 MG capsule Take 20 mg by mouth See admin instructions. Take 1 tablet on days 1 and 2.  Skip day 3. Repeat.   Yes Historical Provider, MD  PRESCRIPTION MEDICATION Take 1 tablet by mouth at bedtime. Sample diabetes medication from Dr. Lavone Orn.   Yes Historical Provider, MD  rosuvastatin (CRESTOR) 10 MG tablet Take 10 mg by mouth daily.     Yes Historical Provider, MD  tamsulosin (FLOMAX) 0.4 MG CAPS capsule Take 0.4 mg by  mouth 2 (two) times daily.   Yes Historical Provider, MD  vitamin C (ASCORBIC ACID) 500 MG tablet Take 500 mg by mouth 2 (two) times daily.     Yes Historical Provider, MD   BP 168/72 mmHg  Pulse 72  Temp(Src) 97.5 F (36.4 C) (Oral)  Resp 16  SpO2 100% Physical Exam  Constitutional: He appears well-developed and well-nourished. No distress.  HENT:  Head: Normocephalic and atraumatic.  Eyes: Conjunctivae are normal. Right eye exhibits no discharge. Left eye exhibits no discharge.  Neck: Neck supple.  Cardiovascular: Normal rate, regular rhythm and normal heart sounds.  Exam reveals no gallop and no friction rub.  No murmur heard. Pulmonary/Chest: Effort normal and breath sounds normal. No respiratory distress.  Abdominal: Soft. He exhibits no distension. There is no tenderness.  Genitourinary:  Note: Patient was evaluated after catheter was placed by nursing. No abdominal tenderness. Bladder does not seem enlarged. He has a Foley catheter in place which appears to be functioning normally with pale yellow urine noted. ~900cc in bag.   Musculoskeletal: He exhibits no edema or tenderness.  Neurological: He is alert.  Skin: Skin is warm and dry.  Psychiatric: He has a normal mood and affect. His behavior is normal. Thought content normal.  Nursing note and vitals reviewed.   ED Course  Procedures (including critical care time) Labs Review Labs Reviewed  URINALYSIS, ROUTINE W REFLEX MICROSCOPIC (NOT AT [facility])    Imaging Review No results found. I have personally reviewed and evaluated these images and lab results as part of my medical decision-making.   EKG Interpretation None      MDM   Final diagnoses:  Urinary retention    28y male with urinary retention. Catheter placed. Drainage of approximately 900 mL of urine. Feels significantly better. Urinalysis is unremarkable. Has established urology follow-up.    Virgel Manifold, MD 02/25/15 [phone]

## 2015-02-09 NOTE — ED Notes (Addendum)
Patient was alert, oriented and stable upon discharge. RN went over AVS and patient had no further questions. Pt was given instructions on the maintenance of foley cath and told to f/u with [facility] in the morning to make an appt for removal.

## 2015-02-09 NOTE — Discharge Instructions (Signed)
Acute Urinary Retention, Male °Acute urinary retention is the temporary inability to urinate. °This is a common problem in older men. As men age their prostates become larger and block the flow of urine from the bladder. This is usually a problem that has come on gradually.  °HOME CARE INSTRUCTIONS °If you are sent home with a Foley catheter and a drainage system, you will need to discuss the best course of action with your health care provider. While the catheter is in, maintain a good intake of fluids. Keep the drainage bag emptied and lower than your catheter. This is so that contaminated urine will not flow back into your bladder, which could lead to a urinary tract infection. °There are two main types of drainage bags. One is a large bag that usually is used at night. It has a good capacity that will allow you to sleep through the night without having to empty it. The second type is called a leg bag. It has a smaller capacity, so it needs to be emptied more frequently. However, the main advantage is that it can be attached by a leg strap and can go underneath your clothing, allowing you the freedom to move about or leave your home. °Only take over-the-counter or prescription medicines for pain, discomfort, or fever as directed by your health care provider.  °SEEK MEDICAL CARE IF: °· You develop a low-grade fever. °· You experience spasms or leakage of urine with the spasms. °SEEK IMMEDIATE MEDICAL CARE IF:  °· You develop chills or fever. °· Your catheter stops draining urine. °· Your catheter falls out. °· You start to develop increased bleeding that does not respond to rest and increased fluid intake. °MAKE SURE YOU: °· Understand these instructions. °· Will watch your condition. °· Will get help right away if you are not doing well or get worse. °  °This information is not intended to replace advice given to you by your health care provider. Make sure you discuss any questions you have with your health care  provider. °  °Document Released: 06/12/2000 Document Revised: 07/21/2014 Document Reviewed: 08/15/2012 °Elsevier Interactive Patient Education ©2016 Else[employer] Inc[employer] ° °

## 2015-02-09 NOTE — ED Notes (Signed)
Pt states that he was sent by Dr. Era Bumpers to have a foley placed d/t urinary retention.  Just drips when he tries to urinate.  Has had difficulty emptying his bladder for 2 years.

## 2015-02-15 DIAGNOSIS — N3281 Overactive bladder: Secondary | ICD-10-CM | POA: Diagnosis not present

## 2015-02-22 DIAGNOSIS — C61 Malignant neoplasm of prostate: Secondary | ICD-10-CM | POA: Diagnosis not present

## 2015-02-22 DIAGNOSIS — N402 Nodular prostate without lower urinary tract symptoms: Secondary | ICD-10-CM | POA: Diagnosis not present

## 2015-02-24 ENCOUNTER — Encounter: Payer: Self-pay | Admitting: *Deleted

## 2015-02-24 ENCOUNTER — Other Ambulatory Visit (HOSPITAL_COMMUNITY): Payer: Self-pay | Admitting: Oncology

## 2015-02-24 DIAGNOSIS — C61 Malignant neoplasm of prostate: Secondary | ICD-10-CM

## 2015-02-25 ENCOUNTER — Telehealth: Payer: Self-pay | Admitting: Oncology

## 2015-02-25 NOTE — Telephone Encounter (Signed)
Pt confirmed appt for new pt referral and mailed out new pt packet

## 2015-03-03 ENCOUNTER — Encounter (HOSPITAL_COMMUNITY)
Admission: RE | Admit: 2015-03-03 | Discharge: 2015-03-03 | Disposition: A | Discharge status: Home / Self Care | Payer: Medicare Other | Source: Ambulatory Visit | Attending: Urology | Admitting: Urology

## 2015-03-03 ENCOUNTER — Encounter (HOSPITAL_COMMUNITY)
Admission: RE | Admit: 2015-03-03 | Discharge: 2015-03-03 | Disposition: A | Discharge status: Home / Self Care | Payer: Medicare Other | Source: Ambulatory Visit | Attending: Oncology | Admitting: Oncology

## 2015-03-03 DIAGNOSIS — C61 Malignant neoplasm of prostate: Secondary | ICD-10-CM | POA: Diagnosis present

## 2015-03-03 MED ORDER — TECHNETIUM TC 99M MEDRONATE IV KIT
24.9000 | PACK | Freq: Once | INTRAVENOUS | Status: AC | PRN
  Administered 2015-03-03: 24.9 via INTRAVENOUS

## 2015-03-04 ENCOUNTER — Telehealth: Payer: Self-pay | Admitting: Oncology

## 2015-03-04 ENCOUNTER — Ambulatory Visit (HOSPITAL_BASED_OUTPATIENT_CLINIC_OR_DEPARTMENT_OTHER): Payer: Medicare Other | Admitting: Oncology

## 2015-03-04 VITALS — BP 143/58 | HR 62 | Temp 97.5°F | Resp 18 | Ht 66.0 in | Wt 169.9 lb

## 2015-03-04 DIAGNOSIS — M545 Low back pain: Secondary | ICD-10-CM | POA: Diagnosis not present

## 2015-03-04 DIAGNOSIS — G8929 Other chronic pain: Secondary | ICD-10-CM

## 2015-03-04 DIAGNOSIS — C61 Malignant neoplasm of prostate: Secondary | ICD-10-CM | POA: Diagnosis not present

## 2015-03-04 DIAGNOSIS — Z807 Family history of other malignant neoplasms of lymphoid, hematopoietic and related tissues: Secondary | ICD-10-CM

## 2015-03-04 DIAGNOSIS — Z87891 Personal history of nicotine dependence: Secondary | ICD-10-CM

## 2015-03-04 DIAGNOSIS — R339 Retention of urine, unspecified: Secondary | ICD-10-CM | POA: Diagnosis not present

## 2015-03-04 NOTE — Consult Note (Signed)
Reason for Referral: Prostate cancer.   HPI: This is a pleasant 79 year old gentleman currently of [place] where he lived the majority of his life. He has a history of diabetes, coronary disease as well as degenerative arthritis. He has had a progressive and long term lower urinary tract symptoms including incomplete emptying, frequency urgency and nocturia. He was treated with finasteride and have been on tamsulosin at some point. He was found to develop urinary retention and seen urgently in the emergency department on 02/09/2015. At that time he had a catheter placed and 900 mL of urine or obtained and a follow-up with urology was established. His PSA at that time was 2.98 on finasteride and a rectal examination by Dr. Gaynelle Arabian revealed palpable nodules on his prostate. On 02/22/2015 he underwent a prostate biopsy by Dr. Louis Meckel and showed high volume disease with Gleason score 4+5 = 9 prostate cancer in the majority of the cores. The remaining of the cores all had cancer of Gleason score 4+4 = 8. He did have a bone scan and a CT scan for staging purposes. His CT scan did not show any evidence of metastatic disease beyond the prostate. His bone scan showed predominately degenerative arthritis in the lumbar and thoracic spine. He still have a catheter in place and not able to urinate freely at this time. Otherwise he is asymptomatic. Has not reported any constitutional symptoms of weight loss or appetite changes or decline in his energy. He does report chronic back issues which have not changed dramatically. 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  He does not report any headaches, blurry vision, syncope or seizures. Does not report any fevers, chills, sweats or weight loss. Does not report any chest pain, palpitation or orthopnea. Does not report any cough, wheezing or hemoptysis. Does not report any nausea, vomiting, abdominal pain, constipation or hematochezia. He does report urinary  symptoms including low stream difficulty urination but no hematuria. Does not report any arthralgias or myalgias. Does not report any lymphadenopathy or petechiae. Remaining review of systems unremarkable.   Past Medical History  Diagnosis Date  . Hyperlipidemia   . Mitral valve prolapse   . Aortic valve regurgitation   . Degenerative disk disease   . Major depression ([place])   . Spinal stenosis   . Coronary artery disease     post stents; prior CABG; s/p cath January 2014 with 2 VD, patent SVG to [facility] and patent SVG to PL patent  . Hypertension   . Carotid artery occlusion   . Heart murmur   . Anginal pain ([place])   . History of esophageal stricture     "has it stretched q once in awhile" (03/26/2012"  . OSA (obstructive sleep apnea)     "have a mask; haven't used one in years" (03/26/2012)  . Type II diabetes mellitus ([place])   . Stroke ([place]) 2014  . Kidney stones   . Skin cancer of nose 2012    "right side" (03/26/2012)  . Skin cancer of trunk     "left chest" (03/26/2012)  . GERD (gastroesophageal reflux disease)   :  Past Surgical History  Procedure Laterality Date  . Adenoidectomy  1956  . Lumbar disc surgery  1996  . Carotid endarterectomy  05/05/2003    right carotid endarterectomy  . Shoulder arthroscopy  01/07/2007    Left shoulder impingement, labral tear  . Cardiac catheterization  10/20/2003    Est. EF of 65% -- Critical disease in the  mid to distal circumflex --  Preserved left ventricular  function --   . Coronary angioplasty with stent placement  10/20/2003    Percutaneous coronary intervention/drug-eluding stent implantation, third marginal branch --  Percutaneous closure, right femoral artery -- Atherosclerotic coronary vascular disease, single vessel / Status post successful percutaneous coronary intervention/tandem drug -- eluding stent implantation proximal third marginal branch -- Typical angina was not reproduced with device insertion or balloon   . Hematoma  evacuation  05/09/2003    Evacuation of hematoma, right neck  . Coronary artery bypass graft  08/29/2010    CABG X2; SVG to PDA, SVG to [place].  . Aortic valve replacement  08/29/2010    25 mm CE Magna bioprosthetic - [facility]  . Mitral valve repair  08/23/2010    28 mm Simulus ring -[facility]  . Tonsillectomy and adenoidectomy  1956  . Appendectomy  1951  . Posterior fusion lumbar spine  1997  . Cervical disc surgery  1980's  . Anterior cervical decomp/discectomy fusion  2000's  . Skin cancer excision  1990's; 2012    "left chest & right nose" (03/26/2012)  . Cardiac valve replacement    . Tee without cardioversion N/A 05/15/2012    Procedure: TRANSESOPHAGEAL ECHOCARDIOGRAM (TEE);  Surgeon: Thayer Headings, MD;  Location: [place];  Service: Cardiovascular;  Laterality: N/A;  . Left heart catheterization with coronary angiogram N/A 03/27/2012    Procedure: LEFT HEART CATHETERIZATION WITH CORONARY ANGIOGRAM;  Surgeon: Peter M Martinique, MD;  Location: [facility] CATH LAB;  Service: Cardiovascular;  Laterality: N/A;  :   Current outpatient prescriptions:  .  amLODipine (NORVASC) 5 MG tablet, Take 2.5 mg by mouth every evening. Take 1/2 Tab Daily, Disp: , Rfl:  .  aspirin 325 MG tablet, Take 325 mg by mouth daily., Disp: , Rfl:  .  finasteride (PROSCAR) 5 MG tablet, Take 5 mg by mouth daily., Disp: , Rfl:  .  FLUoxetine (PROZAC) 20 MG capsule, Take 20 mg by mouth at bedtime. , Disp: , Rfl:  .  Melatonin 3 MG TABS, Take 1 tablet by mouth daily., Disp: , Rfl:  .  metFORMIN (GLUMETZA) 1000 MG (MOD) 24 hr tablet, Take 1 tablet (1,000 mg total) by mouth 2 (two) times daily with a meal., Disp: , Rfl:  .  metoprolol tartrate (LOPRESSOR) 25 MG tablet, Take 25 mg by mouth 2 (two) times daily. 1/2 tablet in the morning, 1 tablet in the evening, Disp: , Rfl:  .  Multiple Vitamin (MULTIVITAMIN PO), Take by mouth.  , Disp: , Rfl:  .  nitroGLYCERIN (NITROSTAT) 0.4 MG SL tablet, Place 1 tablet (0.4 mg total) under the  tongue every 5 (five) minutes as needed. For chest pain, Disp: 25 tablet, Rfl: 5 .  omeprazole (PRILOSEC) 20 MG capsule, Take 20 mg by mouth See admin instructions. Take 1 tablet on days 1 and 2.  Skip day 3. Repeat., Disp: , Rfl:  .  PRESCRIPTION MEDICATION, Take 1 tablet by mouth at bedtime. Sample diabetes medication from Dr. Lavone Orn., Disp: , Rfl:  .  rosuvastatin (CRESTOR) 10 MG tablet, Take 10 mg by mouth daily.  , Disp: , Rfl:  .  tamsulosin (FLOMAX) 0.4 MG CAPS capsule, Take 0.4 mg by mouth 2 (two) times daily., Disp: , Rfl:  .  vitamin C (ASCORBIC ACID) 500 MG tablet, Take 500 mg by mouth 2 (two) times daily.  , Disp: , Rfl: :  No Known Allergies:  Family History  Problem Relation Age of Onset  . Diabetes Mother   . Coronary artery disease Mother   . Heart disease Mother   . Stroke Father   . Cancer Daughter     lymphoma hodgkins   . Seizures Daughter   :  Social History   Social History  . Marital Status: Married    Spouse Name: N/A  . Number of Children: N/A  . Years of Education: N/A   Occupational History  . Not on file.   Social History Main Topics  . Smoking status: Former Smoker -- 5.00 packs/day for 23 years    Types: Cigarettes    Quit date: 10/02/1978  . Smokeless tobacco: Never Used  . Alcohol Use: No     Comment: 03/26/2012 "last alcohol was in 1970; never had problem w/it"  . Drug Use: No  . Sexual Activity: No   Other Topics Concern  . Not on file   Social History Narrative  :  Pertinent items are noted in HPI.  Exam: ECOG 0 Blood pressure 143/58, pulse 62, temperature 97.5 F (36.4 C), temperature source Oral, resp. rate 18, height 5\' 6"  (1.676 m), weight 169 lb 14.4 oz (77.066 kg), SpO2 99 %. General appearance: alert and cooperative. Without distress. Head: Normocephalic, without obvious abnormality Throat: lips, mucosa, and tongue normal; teeth and gums normal no oral ulcers or lesions. Neck: no adenopathy no thyromegaly. Back:  negative Resp: clear to auscultation bilaterally no rhonchi or wheezes or dullness to percussion. Cardio: regular rate and rhythm, S1, S2 normal, no murmur, click, rub or gallop no leg edema. GI: soft, non-tender; bowel sounds normal; no masses,  no organomegaly Extremities: extremities normal, atraumatic, no cyanosis or edema Pulses: 2+ and symmetric  CBC    Component Value Date/Time   WBC 11.1* 05/10/2012 0229   RBC 3.81* 05/10/2012 0229   HGB 11.4* 05/10/2012 0229   HCT 32.5* 05/10/2012 0229   PLT 266 05/10/2012 0229   MCV 85.3 05/10/2012 0229   MCH 29.9 05/10/2012 0229   MCHC 35.1 05/10/2012 0229   RDW 13.4 05/10/2012 0229   LYMPHSABS 3.4 03/26/2012 1734   MONOABS 0.7 03/26/2012 1734   EOSABS 0.5 03/26/2012 1734   BASOSABS 0.0 03/26/2012 1734      Chemistry      Component Value Date/Time   NA 140 05/10/2012 0229   K 4.3 05/10/2012 0229   CL 104 05/10/2012 0229   CO2 26 05/10/2012 0229   BUN 11 05/10/2012 0229   CREATININE 0.77 05/10/2012 0229      Component Value Date/Time   CALCIUM 9.5 05/10/2012 0229   ALKPHOS 65 05/09/2012 0720   AST 17 05/09/2012 0720   ALT 14 05/09/2012 0720   BILITOT 0.5 05/09/2012 0720       Nm Bone Scan Whole Body  03/03/2015  CLINICAL DATA:  Prostate cancer. EXAM: NUCLEAR MEDICINE WHOLE BODY BONE SCAN TECHNIQUE: Whole body anterior and posterior images were obtained approximately 3 hours after intravenous injection of radiopharmaceutical. RADIOPHARMACEUTICALS:  24.9 mCi Technetium-72m MDP IV COMPARISON:  MRI 05/10/2014.  CT 11/17/2013. FINDINGS: Bilateral renal function and excretion. Thoracolumbar spine scoliosis. Areas of increased uptake noted throughout the thoracic and lumbar spine, most likely degenerative. Metastatic disease cannot be excluded. MRI of the thoracic and lumbar spine can be obtained to further evaluate. IMPRESSION: Punctate areas of mild increased activity noted thoracic and lumbar spine, most likely degenerative, MRI  of the thoracic and lumbar spine can be obtained to further evaluate as needed.  No other focal significant bony abnormalities identified . Electronically Signed   By: Marcello Moores  Register   On: 03/03/2015 11:19    Assessment and Plan:   79 year old gentleman with the following issues:  1. Prostate cancer diagnosed in December 2016. He presented with urinary retention and digital rectal examination showed bilateral prostate nodules. He had a PSA of 2.98 on finasteride and his biopsy showed a Gleason score 4+5 = 9 and the majority of cores and the tertiary pattern of 4+4 = 8 and the remaining cores. All 12 cores involved with cancer. Imaging studies did not show any convincing evidence of metastatic disease at this time.  The natural course of this disease was discussed with the patient and his wife. Treatment options were reviewed which include definitive radiation therapy with androgen deprivation versus try modality therapy which include surgery and adjuvant radiation therapy and possible hormone therapy. I'm not sure if he would be a surgical candidate given the potential locally advanced nature of his prostate cancer and his age.  I have recommended evaluation at the prostate cancer multidisciplinary clinic to get a complete evaluation from all disciplines. I anticipate he will likely need radiation therapy and at least 2 years of androgen deprivation therapy.  The logistics of hormone therapy were reviewed today and he complications associated with it. These complications include hot flashes, erectile dysfunction, weight gain, hyperglycemia, hyperlipidemia among others.  If further imaging studies did demonstrate metastatic bony disease then hormone therapy alone would be the treatment of choice. His bone scan did show punctate area that are likely degenerative but will obtain an MRI of the thoracic and lumbar spine for completeness.  2. Urinary retention: This might dictate the way he is treated he  might require a TURP procedure before definitive radiation therapy or potentially surgical approach and a prostatectomy given his severity of his symptoms. Further discussion as needed with urology regarding this issue. He continues to have a catheter in place for the time being.  3. Follow-up: Will be after evaluation at the prostate cancer multidisciplinary clinic to decide on the long-term plan.

## 2015-03-04 NOTE — Progress Notes (Signed)
Please see consult note.  

## 2015-03-04 NOTE — Telephone Encounter (Signed)
Gave and printed avs for pt °

## 2015-03-08 DIAGNOSIS — R3915 Urgency of urination: Secondary | ICD-10-CM | POA: Diagnosis not present

## 2015-03-10 DIAGNOSIS — Z85828 Personal history of other malignant neoplasm of skin: Secondary | ICD-10-CM | POA: Diagnosis not present

## 2015-03-10 DIAGNOSIS — D225 Melanocytic nevi of trunk: Secondary | ICD-10-CM | POA: Diagnosis not present

## 2015-03-10 DIAGNOSIS — L814 Other melanin hyperpigmentation: Secondary | ICD-10-CM | POA: Diagnosis not present

## 2015-03-10 DIAGNOSIS — L821 Other seborrheic keratosis: Secondary | ICD-10-CM | POA: Diagnosis not present

## 2015-03-10 DIAGNOSIS — C44519 Basal cell carcinoma of skin of other part of trunk: Secondary | ICD-10-CM | POA: Diagnosis not present

## 2015-03-10 DIAGNOSIS — D485 Neoplasm of uncertain behavior of skin: Secondary | ICD-10-CM | POA: Diagnosis not present

## 2015-03-10 DIAGNOSIS — D2262 Melanocytic nevi of left upper limb, including shoulder: Secondary | ICD-10-CM | POA: Diagnosis not present

## 2015-03-10 DIAGNOSIS — D1801 Hemangioma of skin and subcutaneous tissue: Secondary | ICD-10-CM | POA: Diagnosis not present

## 2015-03-18 ENCOUNTER — Other Ambulatory Visit: Payer: Self-pay | Admitting: Oncology

## 2015-03-18 ENCOUNTER — Ambulatory Visit (HOSPITAL_COMMUNITY)
Admission: RE | Admit: 2015-03-18 | Discharge: 2015-03-18 | Disposition: A | Discharge status: Home / Self Care | Payer: Medicare Other | Source: Ambulatory Visit | Attending: Oncology | Admitting: Oncology

## 2015-03-18 ENCOUNTER — Telehealth: Payer: Self-pay | Admitting: *Deleted

## 2015-03-18 DIAGNOSIS — M545 Low back pain: Secondary | ICD-10-CM | POA: Insufficient documentation

## 2015-03-18 DIAGNOSIS — M5134 Other intervertebral disc degeneration, thoracic region: Secondary | ICD-10-CM | POA: Insufficient documentation

## 2015-03-18 DIAGNOSIS — M2578 Osteophyte, vertebrae: Secondary | ICD-10-CM | POA: Diagnosis not present

## 2015-03-18 DIAGNOSIS — M5136 Other intervertebral disc degeneration, lumbar region: Secondary | ICD-10-CM | POA: Insufficient documentation

## 2015-03-18 DIAGNOSIS — Z8546 Personal history of malignant neoplasm of prostate: Secondary | ICD-10-CM | POA: Diagnosis not present

## 2015-03-18 DIAGNOSIS — M1288 Other specific arthropathies, not elsewhere classified, other specified site: Secondary | ICD-10-CM | POA: Insufficient documentation

## 2015-03-18 DIAGNOSIS — M47814 Spondylosis without myelopathy or radiculopathy, thoracic region: Secondary | ICD-10-CM | POA: Insufficient documentation

## 2015-03-18 DIAGNOSIS — M47816 Spondylosis without myelopathy or radiculopathy, lumbar region: Secondary | ICD-10-CM | POA: Diagnosis not present

## 2015-03-18 DIAGNOSIS — C61 Malignant neoplasm of prostate: Secondary | ICD-10-CM

## 2015-03-18 LAB — POCT I-STAT CREATININE: CREATININE: 0.8 mg/dL (ref 0.61–1.24)

## 2015-03-18 MED ORDER — GADOBENATE DIMEGLUMINE 529 MG/ML IV SOLN
20.0000 mL | Freq: Once | INTRAVENOUS | Status: AC | PRN
  Administered 2015-03-18: 16 mL via INTRAVENOUS

## 2015-03-18 NOTE — Telephone Encounter (Signed)
As noted below by Dr. Alen Blew, I informed patient of his MRI results. Patient verbalized understanding.

## 2015-03-18 NOTE — Telephone Encounter (Signed)
-----   Message from Wyatt Portela, MD sent at 03/18/2015 10:55 AM EST ----- Please let him know his MRI showed no cancer.

## 2015-03-23 ENCOUNTER — Telehealth: Payer: Self-pay | Admitting: Medical Oncology

## 2015-03-23 NOTE — Telephone Encounter (Signed)
[name] called asking about the time of his appointment. I confirmed appointment with him and I informed him that I will call him the day before to remind him. He voiced understanding.

## 2015-03-25 ENCOUNTER — Encounter: Payer: Self-pay | Admitting: Medical Oncology

## 2015-03-25 NOTE — Progress Notes (Signed)
Oncology Nurse Navigator Documentation  Oncology Nurse Navigator Flowsheets 03/04/2015 03/23/2015  Navigator Location [facility]- I met [name] and his wife today following his visit with Dr. Shadad. During his visit with Dr. Shadad discussed he was informed of the Prostate MDC and the patient would like to be referred. I introduced myself,provided him with my business card and discussed my role as a navigator. Pt will be scheduled for the clinic 1/13. I will mail him a new patient packet. I asked him to call me with any questions or concerns.He voiced understanding. -  Navigator Encounter Type Initial MedOnc Telephone  Patient Visit Type MedOnc -  Barriers/Navigation Needs No barriers at this time -  Interventions Referrals -  Time Spent with Patient 30 15    

## 2015-03-25 NOTE — Progress Notes (Signed)
Oncology Nurse Navigator Documentation  Oncology Nurse Navigator Flowsheets 03/04/2015 03/23/2015 03/25/2015  Navigator Location [facility] - -  Navigator Encounter Type Initial MedOnc Telephone Telephone- I called [employer] and requested pathology slides to be sent to Dr. Orene Desanctis at [facility] for [facility]state [facility] 04/02/15.  Patient Visit Type MedOnc - -  Barriers/Navigation Needs No barriers at this time - Coordination of Care  Interventions Referrals - Coordination of Care  Coordination of Care - - Other  Time Spent with Patient 30 15 -

## 2015-03-30 ENCOUNTER — Encounter: Payer: Self-pay | Admitting: Radiation Oncology

## 2015-03-30 NOTE — Progress Notes (Signed)
GU Location of Tumor / Histology: prostatic adenocarcinoma  If Prostate Cancer, Gleason Score is (4 + 5) and PSA is (2.98) on 02/09/2015  [name] presented to the emergency room on 02/09/2015 with urinary retention. A catheter was placed and 900 cc of urine obtained. Follow up with urology was established. Underwent prostate biopsy on 02/22/2015.   Biopsies of prostate (if applicable) revealed:    Past/Anticipated interventions by urology, if any: treated with finasteride and has been on tamsulosin. Prostate biopsy. Catheter placement.  Past/Anticipated interventions by medical oncology, if any: Consulted by [facility] on 03/04/2015. Referral made to [facility].  Weight changes, if any: no  Bowel/Bladder complaints, if any: incomplete emptying, frequency, urgency, nocturia, low stream   Nausea/Vomiting, if any: no  Pain issues, if any:  no  SAFETY ISSUES:  Prior radiation? no  Pacemaker/ICD? no  Possible current pregnancy? no  Is the patient on methotrexate? no  Current Complaints / other details:  80 year old male. Works part time as a Chief Executive Officer. Married. Prostate volume 30 cc. Question: radiation therapy with at least 2 years of androgen deprivation vs TURP before definitive therapy or prostatectomy.

## 2015-04-01 ENCOUNTER — Telehealth: Payer: Self-pay | Admitting: Medical Oncology

## 2015-04-01 NOTE — Telephone Encounter (Signed)
Oncology Nurse Navigator Documentation  Oncology Nurse Navigator Flowsheets 03/23/2015 03/25/2015 04/01/2015  Navigator Location - - -  Navigator Encounter Type Telephone Telephone Telephone- Left a voicemail requesting a return call to confirm appointment for 1/13 Prostate  MDC.   Telephone - - Outgoing Call;Appt Confirmation/Clarification  Patient Visit Type - - -  Barriers/Navigation Needs - Coordination of Care -  Interventions - Coordination of Care -  Coordination of Care - Other -  Time Spent with Patient 15 - 15

## 2015-04-02 ENCOUNTER — Ambulatory Visit (HOSPITAL_BASED_OUTPATIENT_CLINIC_OR_DEPARTMENT_OTHER): Payer: Medicare Other | Admitting: Oncology

## 2015-04-02 ENCOUNTER — Encounter: Payer: Self-pay | Admitting: General Practice

## 2015-04-02 ENCOUNTER — Ambulatory Visit
Admission: RE | Admit: 2015-04-02 | Discharge: 2015-04-02 | Disposition: A | Discharge status: Home / Self Care | Payer: Medicare Other | Source: Ambulatory Visit | Attending: Radiation Oncology | Admitting: Radiation Oncology

## 2015-04-02 ENCOUNTER — Encounter: Payer: Self-pay | Admitting: Radiation Oncology

## 2015-04-02 ENCOUNTER — Encounter: Payer: Self-pay | Admitting: Medical Oncology

## 2015-04-02 ENCOUNTER — Encounter: Payer: Self-pay | Admitting: Adult Health

## 2015-04-02 VITALS — BP 127/52 | HR 71 | Resp 16 | Ht 66.0 in | Wt 169.4 lb

## 2015-04-02 DIAGNOSIS — R339 Retention of urine, unspecified: Secondary | ICD-10-CM

## 2015-04-02 DIAGNOSIS — C61 Malignant neoplasm of prostate: Secondary | ICD-10-CM

## 2015-04-02 NOTE — Progress Notes (Signed)
Radiation Oncology         [phone] ________________________________  Multidisciplinary Prostate Cancer Clinic  Initial Radiation Oncology Consultation  Name: [name] MRN: [id number]  Date: 04/02/2015  DOB: 04-29-1935  [id number] [name], MD  Lavone Orn, MD   REFERRING PHYSICIAN: Lavone Orn, MD  DIAGNOSIS: 80 y.o. gentleman with stage T2 adenocarcinoma of the prostate with a Gleason's score of 4+5 and a PSA of 2.21 (on Finasteride)    ICD-9-CM ICD-10-CM   1. Malignant neoplasm of prostate ([place]) [place] ILLNESS::[name] is a 80 y.o. gentleman. He presented to the ED on 02/13/15 with urinary retention. This prompted a referral to Dr. Gaynelle Arabian. Upon digital rectal exam, a nodule was palpated in the right base. His PSA was checked with urologist Dr. Louis Meckel and found to be 2.21 (on finasteride). The patient proceeded to transrectal ultrasound with 12 biopsies of the prostate on 02/22/15.  The prostate volume measured 30 cc.  Out of 12 core biopsies,12 were positive. The maximum Gleason score was 4+5, and this was seen in bilateral specimens. A bone scan was performed as well as an MRI. Both were negative.  The patient reviewed the biopsy results with his urologist and he has kindly been referred today to the multidisciplinary prostate cancer clinic for presentation of pathology and radiology studies in our conference for discussion of potential radiation treatment options and clinical evaluation.    [name]    PREVIOUS RADIATION THERAPY: No  PAST MEDICAL HISTORY:  has a past medical history of Hyperlipidemia; Mitral valve prolapse; Aortic valve regurgitation; Degenerative disk disease; Major depression ([place]); Spinal stenosis; Coronary artery disease; Hypertension; Carotid artery occlusion; Heart murmur; Anginal pain ([place]); History of esophageal stricture; OSA (obstructive sleep apnea); Type II diabetes mellitus ([place]); Stroke [facility])  (2014); Kidney stones; Skin cancer of nose (2012); Skin cancer of trunk; GERD (gastroesophageal reflux disease); and Prostate cancer ([place]).    PAST SURGICAL HISTORY: Past Surgical History  Procedure Laterality Date  . Adenoidectomy  1956  . Lumbar disc surgery  1996  . Carotid endarterectomy  05/05/2003    right carotid endarterectomy  . Shoulder arthroscopy  01/07/2007    Left shoulder impingement, labral tear  . Cardiac catheterization  10/20/2003    Est. EF of 65% -- Critical disease in the mid to distal circumflex --  Preserved left ventricular  function --   . Coronary angioplasty with stent placement  10/20/2003    Percutaneous coronary intervention/drug-eluding stent implantation, third marginal branch --  Percutaneous closure, right femoral artery -- Atherosclerotic coronary vascular disease, single vessel / Status post successful percutaneous coronary intervention/tandem drug -- eluding stent implantation proximal third marginal branch -- Typical angina was not reproduced with device insertion or balloon   . Hematoma evacuation  05/09/2003    Evacuation of hematoma, right neck  . Coronary artery bypass graft  08/29/2010    CABG X2; SVG to PDA, SVG to [place].  . Aortic valve replacement  08/29/2010    25 mm CE Magna bioprosthetic - [facility]  . Mitral valve repair  08/23/2010    28 mm Simulus ring -[facility]  . Tonsillectomy and adenoidectomy  1956  . Appendectomy  1951  . Posterior fusion lumbar spine  1997  . Cervical disc surgery  1980's  . Anterior cervical decomp/discectomy fusion  2000's  . Skin cancer excision  1990's; 2012    "left chest & right nose" (03/26/2012)  . Cardiac valve  replacement    . Tee without cardioversion N/A 05/15/2012    Procedure: TRANSESOPHAGEAL ECHOCARDIOGRAM (TEE);  Surgeon: Thayer Headings, MD;  Location: [place];  Service: Cardiovascular;  Laterality: N/A;  . Left heart catheterization with coronary angiogram N/A 03/27/2012    Procedure: LEFT  HEART CATHETERIZATION WITH CORONARY ANGIOGRAM;  Surgeon: Peter M Martinique, MD;  Location: [facility] CATH LAB;  Service: Cardiovascular;  Laterality: N/A;  . Prostate biopsy      FAMILY HISTORY: family history includes Cancer in his daughter and father; Coronary artery disease in his mother; Diabetes in his mother; Heart disease in his mother; Seizures in his daughter; Stroke in his father.  SOCIAL HISTORY:  reports that he quit smoking about 36 years ago. His smoking use included Cigarettes. He has a 115 pack-year smoking history. He has never used smokeless tobacco. He reports that he does not drink alcohol or use illicit drugs.  ALLERGIES: Review of patient's allergies indicates no known allergies.  MEDICATIONS:  Current Outpatient Prescriptions  Medication Sig Dispense Refill  . amLODipine (NORVASC) 5 MG tablet Take 2.5 mg by mouth every evening. Take 1/2 Tab Daily    . aspirin 325 MG tablet Take 325 mg by mouth daily.    . finasteride (PROSCAR) 5 MG tablet Take 5 mg by mouth daily.    [name] FLUoxetine (PROZAC) 20 MG capsule Take 20 mg by mouth at bedtime.     . Melatonin 3 MG TABS Take 1 tablet by mouth daily.    . metFORMIN (GLUMETZA) 1000 MG (MOD) 24 hr tablet Take 1 tablet (1,000 mg total) by mouth 2 (two) times daily with a meal.    . metoprolol tartrate (LOPRESSOR) 25 MG tablet Take 25 mg by mouth 2 (two) times daily. 1/2 tablet in the morning, 1 tablet in the evening    . Multiple Vitamin (MULTIVITAMIN PO) Take by mouth.      . nitroGLYCERIN (NITROSTAT) 0.4 MG SL tablet Place 1 tablet (0.4 mg total) under the tongue every 5 (five) minutes as needed. For chest pain 25 tablet 5  . PRESCRIPTION MEDICATION Take 1 tablet by mouth at bedtime. Sample diabetes medication from Dr. Lavone Orn.    . rosuvastatin (CRESTOR) 10 MG tablet Take 10 mg by mouth daily.      . sitaGLIPtin (JANUVIA) 100 MG tablet Take 100 mg by mouth daily.    . tamsulosin (FLOMAX) 0.4 MG CAPS capsule Take 0.4 mg by mouth 2 (two)  times daily.    . vitamin C (ASCORBIC ACID) 500 MG tablet Take 500 mg by mouth 2 (two) times daily.      [name] omeprazole (PRILOSEC) 20 MG capsule Take 20 mg by mouth See admin instructions. Reported on 04/02/2015     No current facility-administered medications for this encounter.    REVIEW OF SYSTEMS:  A 15 point review of systems is documented in the electronic medical record. This was obtained by the nursing staff. However, I reviewed this with the patient to discuss relevant findings and make appropriate changes.  A comprehensive review of systems was negative..  The patient completed an IPSS and IIEF questionnaire.  His IPSS score was 35 indicating severe urinary outflow obstructive symptoms.  He indicated that his erectile function is unable to complete sexual activity.  Pt reports incomplete emptying, frequency, urgency, nocturia, low stream. Works part time as a Chief Executive Officer. Married. Prostate volume 30 cc.     PHYSICAL EXAM: This patient is in no acute distress.  He is  alert and oriented.   height is 5\' 6"  (1.676 m) and weight is 169 lb 6.4 oz (76.839 kg). His blood pressure is 127/52 and his pulse is 71. His respiration is 16 and oxygen saturation is 100%.  He exhibits no respiratory distress or labored breathing.  He appears neurologically intact.  His mood is pleasant.  His affect is appropriate.  Please note the digital rectal exam findings described above.  KPS = 90  100 - Normal; no complaints; no evidence of disease. 90   - Able to carry on normal activity; minor signs or symptoms of disease. 80   - Normal activity with effort; some signs or symptoms of disease. 88   - Cares for self; unable to carry on normal activity or to do active work. 60   - Requires occasional assistance, but is able to care for most of his personal needs. 50   - Requires considerable assistance and frequent medical care. 33   - Disabled; requires special care and assistance. 29   - Severely disabled; hospital  admission is indicated although death not imminent. 77   - Very sick; hospital admission necessary; active supportive treatment necessary. 10   - Moribund; fatal processes progressing rapidly. 0     - Dead  Karnofsky DA, Abelmann Roscoe, Craver LS and [place] [facility] [phone]) The use of the nitrogen mustards in the palliative treatment of carcinoma: with particular reference to bronchogenic carcinoma Cancer 1 [phone]   LABORATORY DATA:  Lab Results  Component Value Date   WBC 11.1* 05/10/2012   HGB 11.4* 05/10/2012   HCT 32.5* 05/10/2012   MCV 85.3 05/10/2012   PLT 266 05/10/2012   Lab Results  Component Value Date   NA 140 05/10/2012   K 4.3 05/10/2012   CL 104 05/10/2012   CO2 26 05/10/2012   Lab Results  Component Value Date   ALT 14 05/09/2012   AST 17 05/09/2012   ALKPHOS 65 05/09/2012   BILITOT 0.5 05/09/2012     RADIOGRAPHY: Mr Thoracic Spine W Wo Contrast  03/18/2015  CLINICAL DATA:  History of prostate cancer. Back pain for several months. EXAM: MRI THORACIC SPINE WITHOUT AND WITH CONTRAST TECHNIQUE: Multiplanar and multiecho pulse sequences of the thoracic spine were obtained without and with intravenous contrast. CONTRAST:  71mL MULTIHANCE GADOBENATE DIMEGLUMINE 529 MG/ML IV SOLN COMPARISON:  Bone scan 03/03/2015 FINDINGS: The vertebral body heights are maintained. The alignment is anatomic. There is no acute fracture or static listhesis. The marrow signal is normal. The thoracic spinal cord is normal in size and signal. There is mild degenerative disc disease throughout the thoracic spine. There are no areas of abnormal enhancement. Partially visualized is anterior cervical fusion from C4 through C7. T1-T2: No significant disc protrusion, foraminal stenosis or central canal stenosis. T2-T3: Mild broad-based disc bulge. No foraminal or central canal stenosis. T3-T4: Mild broad-based disc bulge. No foraminal or central canal stenosis. T4-T5: No disc bulge, foraminal stenosis or  central canal stenosis. T5-T6: No significant disc bulge, foraminal stenosis or central canal stenosis. T6-T7: No disc bulge, foraminal stenosis or central canal stenosis. T7-T8: No disc bulge, foraminal stenosis or central canal stenosis. T8-T9: No disc bulge, foraminal stenosis or central canal stenosis. T9-T10: No disc bulge, foraminal stenosis or central canal stenosis. T10-T11: Minimal broad-based disc bulge. No foraminal or central canal stenosis. T11-T12: Mild broad-based disc bulge. Moderate left facet arthropathy. Mild right facet arthropathy. Moderate right foraminal stenosis. No left foraminal stenosis. T12-L1: Mild broad-based disc  bulge. Mild bilateral facet arthropathy. No foraminal stenosis. IMPRESSION: 1. No focal marrow signal abnormality to suggest osseous metastatic disease. 2. Mild thoracic spine spondylosis. Electronically Signed   By: Kathreen Devoid   On: 03/18/2015 10:02   Mr Lumbar Spine W Wo Contrast  03/18/2015  CLINICAL DATA:  History of prostate cancer.  Mid low back pain. EXAM: MRI LUMBAR SPINE WITHOUT AND WITH CONTRAST TECHNIQUE: Multiplanar and multiecho pulse sequences of the lumbar spine were obtained without and with intravenous contrast. CONTRAST:  4mL MULTIHANCE GADOBENATE DIMEGLUMINE 529 MG/ML IV SOLN COMPARISON:  Bone scan 03/03/2015, MR L-spine 05/10/2014 FINDINGS: The vertebral bodies of the lumbar spine are normal in size. The vertebral bodies of the lumbar spine are normal in alignment. There is normal bone marrow signal demonstrated throughout the vertebra. There is solid osseous fusion across the L4-5 disc space with posterior decompression. There is degenerative disc disease with disc height loss throughout the thoracic spine most severe at L5-S1. There are no areas of abnormal enhancement. The spinal cord is normal in signal and contour. The cord terminates normally at L1 . The nerve roots of the cauda equina and the filum terminale are normal. The visualized portions  of the SI joints are unremarkable. The imaged intra-abdominal contents are unremarkable. T12-L1: Mild broad-based disc bulge on the sagittal images. No evidence of neural foraminal stenosis. No central canal stenosis. L1-L2: Mild broad-based disc bulge. Moderate bilateral facet arthropathy with ligamentum flavum infolding resulting in mild spinal stenosis and lateral recess narrowing. No foraminal stenosis. L2-L3: Moderate broad-based disc bulge flattening the ventral thecal sac. Moderate bilateral facet arthropathy. Mild spinal stenosis. Bilateral lateral recess narrowing. No foraminal stenosis. L3-L4: Mild broad-based disc bulge. Mild bilateral facet arthropathy. No evidence of neural foraminal stenosis. No central canal stenosis. L4-L5: Interbody fusion. No evidence of neural foraminal stenosis. No central canal stenosis. L5-S1: Broad-based disc osteophyte complex. Mild bilateral facet arthropathy. Moderate bilateral foraminal narrowing. No central canal stenosis. IMPRESSION: 1. No marrow signal abnormality to suggest osseous metastatic disease. 2. Diffuse lumbar spine spondylosis as described above. 3. Interbody fusion at L4-5 without recurrent foraminal or central canal stenosis. Solid osseous bridging across the L4-5 disc space. Electronically Signed   By: Kathreen Devoid   On: 03/18/2015 10:24   Nm Bone Scan Whole Body  03/03/2015  CLINICAL DATA:  Prostate cancer. EXAM: NUCLEAR MEDICINE WHOLE BODY BONE SCAN TECHNIQUE: Whole body anterior and posterior images were obtained approximately 3 hours after intravenous injection of radiopharmaceutical. RADIOPHARMACEUTICALS:  24.9 mCi Technetium-52m MDP IV COMPARISON:  MRI 05/10/2014.  CT 11/17/2013. FINDINGS: Bilateral renal function and excretion. Thoracolumbar spine scoliosis. Areas of increased uptake noted throughout the thoracic and lumbar spine, most likely degenerative. Metastatic disease cannot be excluded. MRI of the thoracic and lumbar spine can be  obtained to further evaluate. IMPRESSION: Punctate areas of mild increased activity noted thoracic and lumbar spine, most likely degenerative, MRI of the thoracic and lumbar spine can be obtained to further evaluate as needed. No other focal significant bony abnormalities identified . Electronically Signed   By: Marcello Moores  Register   On: 03/03/2015 11:19      IMPRESSION: This gentleman is a pleasant 80 y.o. with stage T2a adenocarcinoma of the prostate with a Gleason's score of 4+5 and a PSA of 2.21 (on Finasteride). His T-Stage, Gleason's Score, and PSA put him into the high risk group.  Accordingly he is eligible for a variety of potential treatment options including including ADT, up-front TURP followed by XRT.The  patient would not be a good candidate for surgery.     PLAN:  Today, I talked to the patient and family about the findings and work-up thus far.  We discussed the natural history of prostate adenocarcinoma and general treatment, highlighting the role of radiotherapy in the management.  We discussed the available radiation techniques, and focused on the details of logistics and delivery.  We reviewed the anticipated acute and late sequelae associated with radiation in this setting.  The patient was encouraged to ask questions that I answered to the best of my ability.  I filled out a patient counseling form during our discussion including treatment diagrams.  We retained a copy for our records.  The patient would like to proceed with radiation and will be scheduled for CT simulation.  I spent 60 minutes minutes face to face with the patient and more than 50% of that time was spent in counseling and/or coordination of care.    ------------------------------------------------  Sheral Apley. Tammi Klippel, M.D.    This document serves as a record of services personally performed by Tyler Pita, MD. It was created on his behalf by Jenell Milliner, a trained medical scribe. The creation of this record  is based on the scribe's personal observations and the provider's statements to them. This document has been checked and approved by the attending provider.

## 2015-04-02 NOTE — Progress Notes (Signed)
   Survivorship Program  [name] is a very pleasant 80 y.o. gentleman from [place], [place] with a diagnosis of prostate adenocarcinoma. He presents today with his wife to the [facility]) for treatment consideration and recommendations from the urologist/surgeon, radiation oncologist, and medical oncologist.    I briefly met with [name] and his wife during his [place] visit today. We discussed the purpose of the Survivorship Clinic, which will include monitoring for recurrence, coordinating completion of age and gender-appropriate cancer screenings, promotion of overall wellness, as well as managing potential late/long-term side effects of anti-cancer treatments.     As of today, the intent of treatment for [name] is cure/local control, therefore he will be eligible for the Survivorship Clinic upon his completion of treatment.  His survivorship care plan (SCP) will be reviewed with him during an in-person visit with myself once he has completed treatment.    [name] was encouraged to ask questions and all questions were answered to his satisfaction.  He was given my business card and encouraged to contact me with any concerns regarding survivorship.  I look forward to participating in his care.    Mike Craze, NP [place] [phone]

## 2015-04-02 NOTE — Progress Notes (Signed)
[place] Psychosocial Distress Screening Spiritual Care  Shadowed by counseling intern Vaughan Sine, met with [name] and his wife at [facility] to introduce [place] team/resources, reviewing distress screen per protocol.  The patient scored a 0 on the Psychosocial Distress Thermometer which indicates mild distress. Also assessed for distress and other psychosocial needs.   ONCBCN DISTRESS SCREENING 04/02/2015  Screening Type Initial Screening  Distress experienced in past week (1-10) 0  Physical Problem type Pain;Changes in urination  Referral to support programs Yes  Other Spiritual Care, counseling interns   [name] reports little distress, good support, no additional concerns.  He engaged little in conversation.  Follow up needed: No.  Pt and spouse aware of ongoing Support Team availability and have packet of programming resources.  Please also page as needs arise.  Thank you.  [place], [place], [facility] Pager [phone] Voicemail  [phone]

## 2015-04-02 NOTE — Progress Notes (Signed)
                               Care Plan Summary  Name: Mr. [name] DOB: 12/10/1935   Your Medical Team:   Urologist -  Dr. Raynelle Bring, [facility]  Radiation Oncologist - Dr. Tyler Pita, [facility]   Medical Oncologist - Dr. Zola Button, [place]  Recommendations: 1) Androgen Deprivation 2) TURP  3) Radiation  * These recommendations are based on information available as of today's consult.      Recommendations may change depending on the results of further tests or exams.  Next Steps: 1) Dr. Carlton Adam office will call to schedule your Androgen Deprivation injection. 2) Dr. Louis Meckel will schedule surgery for TURP 3) After healing from TURP Dr. Johny Shears office will schedule radiation treatments  When appointments need to be scheduled, you will be contacted by [facility] and/or [facility].  Questions?  Please do not hesitate to call Cira Rue, RN, BSN, CRNI at [phone] any questions or concerns.  Shirlean Mylar is your Oncology Nurse Navigator and is available to assist you while you're receiving your medical care at [facility].

## 2015-04-02 NOTE — Consult Note (Signed)
Chief Complaint  Prostate Cancer   Reason For Visit  Reason for consult: To discuss treatment options for high risk prostate cancer and severe LUTS Physician requesting consult: Dr. Burman Nieves PCP: Dr. Lavone Orn Location of consult: Prostate Cancer Multidisciplinary Clinic at [facility]   History of Present Illness  [name] is a 80 year old gentleman with a past medical history significant for coronary artery disease s/p CABG in 2012, stroke, peripheral vascular disease s/p CEA, diabetes, hyperlipidemia, hypertension, extensive smoking history (4 PPD for 25 years prior to quitting over 40 years ago), and sleep apnea.  He has been followed by Dr. Louis Meckel for multiple urologic issues since 2014 including hematuria with a negative urologic evaluation and progressive lower urinary tract symptoms including frequency, urgency, nocturia, and sense of incomplete emptying.  He was treated for BPH with tamsulosin and finasteride with initial improvement but continued progressive symptoms.  He failed other medical therapies (B-3 agonist) and due to his progressive bothersome symptoms and eventual development of urinary retention in November 2016, he was counseled about surgical treatment options for BPH.  He was seen by Dr. Gaynelle Arabian in November 2016 to consider a Urolift procedure and he was noted to have right base prostate nodule that was new.  His PSA at that time was 2.98 confounded by his catheter placement and finasteride use.    He underwent a TRUS biopsy of the prostate by Dr. Louis Meckel on 02/22/15 that confirmed Gleason 4+5=9 adenocarcinoma of the prostate in 13 out of 13 biopsy cores. He underwent staging studies including a CT scan of the pelvis on 03/03/15 that did demonstrate some small pelvic side wall lymph nodes although none that were pathologically enlarged.  His bone scan was performed on 03/03/15 and demonstrated some areas of uptake along the thoracic and lumbar spine  and although metastatic disease could not be excluded, these areas were felt to be most consistent with degenerative changes. An MRI of the thoracic and lumbar spine also demonstrated changes that were consistent with degenerative changes but no lesions suspicious for metastases.  Nomogram OC disease: 16% EPE: 81% SVI: 28% LNI: 35% PFS (surgery): 22% at 5 years, 13% at 10 years  Urinary function: He has severe LUTS refractory to medical therapy and now with urinary retention. Erectile function: He has a history of erectile dysfunction and this is not a priority to him or his wife.   Past Medical History  1. History of Coronary artery disease (I25.10)  2. History of diabetes mellitus (Z86.39)  3. History of esophageal reflux (Z87.19)  4. History of hypercholesterolemia (Z86.39)  5. History of hypertension (Z86.79)  6. History of sleep apnea (Z86.69)  7. History of Stroke syndrome (I63.9)  Surgical History  1. History of Appendectomy  2. History of Back Surgery  3. History of CABG  4. History of Carotid Thromboendarterectomy  5. History of Shoulder Surgery  6. History of Tonsillectomy  Current Meds  1. AmLODIPine Besylate TABS;  Therapy: (Recorded:18Sep2014) to Recorded  2. Aspirin 81 MG TABS;  Therapy: (Recorded:18Sep2014) to Recorded  3. Crestor TABS;  Therapy: (Recorded:18Sep2014) to Recorded  4. Finasteride 5 MG Oral Tablet; TAKE ONE TABLET BY MOUTH  DAILY;  Therapy: 31Aug2015 to (Evaluate:13Jan2017)  Requested for: 16Sep2016; Last  Rx:15Sep2016 Ordered  5. FLUoxetine HCl TABS;  Therapy: (Recorded:18Sep2014) to Recorded  6. Januvia TABS;  Therapy: (Recorded:23Aug2016) to Recorded  7. Melatonin TABS;  Therapy: (Recorded:18Sep2014) to Recorded  8. MetFORMIN HCl - 500 MG Oral Tablet;  Therapy: (Recorded:03May2016) to Recorded  9. Metoprolol Succinate ER 25 MG Oral Tablet Extended Release 24 Hour; 25mg  in am,  50mg  pm;  Therapy: (Recorded:28Apr2015) to Recorded  10.  Omeprazole CPDR;   Therapy: (Recorded:19Oct2015) to Recorded  11. Tamsulosin HCl - 0.4 MG Oral Capsule; TAKE 2 CAPSULE hs;   Therapy: [id number] to (Evaluate:21May2017)  Requested for: 22Nov2016; Last   Rx:22Nov2016; Status: ACTIVE - Renewal Denied Ordered  Allergies  1. No Known Drug Allergies  Family History  1. Family history of Death In The Family Father  2. Family history of Death In The Family Mother  3. Family history of Family Health Status Number Of Children  4. Family history of cardiac disorder (Z82.49) : Mother  5. Family history of diabetes mellitus (Z83.3) : Mother   They have determined that his father did indeed have prostate cancer although was very private about it and therefore there is little known about his cancer.  He died of unrelated causes (stroke) in his 73s.   Social History   Denied: History of Alcohol Use (History)   Former smoker [phone])   Marital History - Currently Married   Occupation:  Review of Systems AU Complete-Male: Constitutional, skin, eye, otolaryngeal, hematologic/lymphatic, cardiovascular, pulmonary, endocrine, musculoskeletal, gastrointestinal, neurological and psychiatric system(s) were reviewed and pertinent findings if present are noted and are otherwise negative.  Integumentary: skin rash/lesion.  Musculoskeletal: joint pain and joint stiffness.    Physical Exam Constitutional: Well nourished and well developed . No acute distress.    Results/Data  I have independently reviewed his pathology slides, medical records, PSA results, bone scan, CT scan, and MRI with findings as dictated above.   Review of his pathology did demonstrate findings suggestive of ductal carcinoma.     Assessment  1. Benign prostatic hyperplasia with urinary obstruction (N40.1,N13.8)  2. Prostate cancer (C61)  Discussion/Summary  1. High risk prostate cancer:  I had a detailed conversation with [name] and his wife today.  We reviewed his  cancer parameters and imaging studies and he understands the high risk nature of his cancer and understands the risk of micrometastatic disease albeit in the absence of any measurable metastatic cancer.  It was therefore recommended that he consider therapy of curative intent.   The patient was counseled about the natural history of prostate cancer and the standard treatment options that are available for prostate cancer. It was explained to him how his age and life expectancy, clinical stage, Gleason score, and PSA affect his prognosis, the decision to proceed with additional staging studies, as well as how that information influences recommended treatment strategies. We discussed the roles for active surveillance, radiation therapy, surgical therapy, androgen deprivation, as well as ablative therapy options for the treatment of prostate cancer as appropriate to his individual cancer situation. We discussed the risks and benefits of these options with regard to their impact on cancer control and also in terms of potential adverse events, complications, and impact on quiality of life particularly related to urinary, bowel, and sexual function. The patient was encouraged to ask questions throughout the discussion today and all questions were answered to his stated satisfaction. In addition, the patient was provided with and/or directed to appropriate resources and literature for further education about prostate cancer and treatment options.   Reviewing the risks and benefits of therapeutic options we reviewed standard options for high risk disease including primary surgery likely with a high risk for needing adjuvant radiation/androgen deprivation vs. primary radiation therapy with concomitant  long term ADT for 2-3 years.  Considering his age and medical comorbidities as well as the higher risk of incontinence at age 35, I did not recommend radical prostatectomy.  We focused our discussion on ADT and radiation  therapy.  We discussed his urinary retention which is presumably due to bladder outlet obstruction from BPH and/or locally advanced prostate cancer.  My recommendation was that he consider beginning systemic ADT in the very near future with plans to proceed with a bladder outlet obstruction procedure such as TURP.  He understands that once he has healed from his TURP (about 3 months), he could then proceed with radiation therapy likely with minimal risk.  I think this approach would help to reduce his risk associated with treatment and treatment related side effects but still help resolve his urinary retention and offer him reasonable chance for curative treatment.    Following our discussion, he stated that he was in agreement and wished to proceed in this manner.  He will be seen by Dr. Alen Blew and Dr. Tammi Klippel this morning as well. I will discuss this plan with Dr. Louis Meckel. All questions were answered to his stated satisfaction.  CC: Dr. Burman Nieves Dr. Lavone Orn Dr. Ledon Snare Dr. Zola Button  A total of 48 minutes were spent in the overall care of the patient today with 48 minutes in direct face to face consultation.    Signatures Electronically signed by : Raynelle Bring, M.D.; Apr 02 2015 12:08PM EST

## 2015-04-02 NOTE — Progress Notes (Signed)
Hematology and Oncology Follow Up Visit  [name] EI:[id number] [birth year] 80 y.o. 04/02/2015 9:56 AM [name], MDGriffin, [name] Reichmann, MD   Principle Diagnosis: 80 year old gentleman with prostate cancer diagnosed in December of 2016. He presented with a PSA of 2.98 abnormal rectal examination, and urinary retention. He was found to have a Gleason score 4+5 equals 9 with tertiary pattern of 4+4 = 8. His staging workup did not reveal any evidence of metastatic disease.  Prior Therapy: He is status post Foley catheter insertion in November 2016. He also had a prostate biopsy thousand and December 2016.  Current therapy: Under evaluation for definitive therapy.  Interim History: [name] presented today for a follow-up visit. He is a pleasant gentleman I saw in consultation back on 03/04/2015 aeration of prostate cancer. He was seen today in the multidisciplinary prostate clinic for discussion. Since the last visit, he completed his staging workup including MRI of the spine which did not show any evidence of metastatic disease. Clinically, he still have lower pelvic pain associated with his Foley catheter and exacerbating by sitting position for an extended period of time. He continues to be reliance on his catheter for urination. Otherwise no other constitutional symptoms. He continues to work part time without any decline.  He does not report any headaches, blurry vision, syncope or seizures. Does not report any fevers, chills, sweats or weight loss. Does not report any chest pain, palpitation or orthopnea. Does not report any cough, wheezing or hemoptysis. Does not report any nausea, vomiting, abdominal pain, constipation or hematochezia. He does report urinary symptoms including low stream difficulty urination but no hematuria. Does not report any arthralgias or myalgias. Does not report any lymphadenopathy or petechiae. Remaining review of systems unremarkable.   Medications: I  have reviewed the patient's current medications.  Current Outpatient Prescriptions  Medication Sig Dispense Refill  . amLODipine (NORVASC) 5 MG tablet Take 2.5 mg by mouth every evening. Take 1/2 Tab Daily    . aspirin 325 MG tablet Take 325 mg by mouth daily.    . finasteride (PROSCAR) 5 MG tablet Take 5 mg by mouth daily.    [name] FLUoxetine (PROZAC) 20 MG capsule Take 20 mg by mouth at bedtime.     . Melatonin 3 MG TABS Take 1 tablet by mouth daily.    . metFORMIN (GLUMETZA) 1000 MG (MOD) 24 hr tablet Take 1 tablet (1,000 mg total) by mouth 2 (two) times daily with a meal.    . metoprolol tartrate (LOPRESSOR) 25 MG tablet Take 25 mg by mouth 2 (two) times daily. 1/2 tablet in the morning, 1 tablet in the evening    . Multiple Vitamin (MULTIVITAMIN PO) Take by mouth.      . nitroGLYCERIN (NITROSTAT) 0.4 MG SL tablet Place 1 tablet (0.4 mg total) under the tongue every 5 (five) minutes as needed. For chest pain 25 tablet 5  . omeprazole (PRILOSEC) 20 MG capsule Take 20 mg by mouth See admin instructions. Reported on 04/02/2015    . PRESCRIPTION MEDICATION Take 1 tablet by mouth at bedtime. Sample diabetes medication from [name].    . rosuvastatin (CRESTOR) 10 MG tablet Take 10 mg by mouth daily.      . sitaGLIPtin (JANUVIA) 100 MG tablet Take 100 mg by mouth daily.    . tamsulosin (FLOMAX) 0.4 MG CAPS capsule Take 0.4 mg by mouth 2 (two) times daily.    . vitamin C (ASCORBIC ACID) 500 MG tablet Take 500 mg  by mouth 2 (two) times daily.       No current facility-administered medications for this visit.     Allergies: No Known Allergies  Past Medical History, Surgical history, Social history, and Family History were reviewed and updated.   Physical Exam: ECOG: 0 General appearance: alert and cooperative Head: Normocephalic, without obvious abnormality Neck: no adenopathy Lymph nodes: Cervical, supraclavicular, and axillary nodes normal. Heart:regular rate and rhythm, S1, S2  normal, no murmur, click, rub or gallop Lung:chest clear, no wheezing, rales, normal symmetric air entry Abdomin: soft, non-tender, without masses or organomegaly EXT:no erythema, induration, or nodules   Lab Results: Lab Results  Component Value Date   WBC 11.1* 05/10/2012   HGB 11.4* 05/10/2012   HCT 32.5* 05/10/2012   MCV 85.3 05/10/2012   PLT 266 05/10/2012     Chemistry      Component Value Date/Time   NA 140 05/10/2012 0229   K 4.3 05/10/2012 0229   CL 104 05/10/2012 0229   CO2 26 05/10/2012 0229   BUN 11 05/10/2012 0229   CREATININE 0.80 03/18/2015 0832      Component Value Date/Time   CALCIUM 9.5 05/10/2012 0229   ALKPHOS 65 05/09/2012 0720   AST 17 05/09/2012 0720   ALT 14 05/09/2012 0720   BILITOT 0.5 05/09/2012 0720       EXAM: MRI LUMBAR SPINE WITHOUT AND WITH CONTRAST  TECHNIQUE: Multiplanar and multiecho pulse sequences of the lumbar spine were obtained without and with intravenous contrast.  CONTRAST: 34mL MULTIHANCE GADOBENATE DIMEGLUMINE 529 MG/ML IV SOLN  COMPARISON: Bone scan 03/03/2015, MR L-spine 05/10/2014  FINDINGS: The vertebral bodies of the lumbar spine are normal in size. The vertebral bodies of the lumbar spine are normal in alignment. There is normal bone marrow signal demonstrated throughout the vertebra.  There is solid osseous fusion across the L4-5 disc space with posterior decompression. There is degenerative disc disease with disc height loss throughout the thoracic spine most severe at L5-S1.  There are no areas of abnormal enhancement.  The spinal cord is normal in signal and contour. The cord terminates normally at L1 . The nerve roots of the cauda equina and the filum terminale are normal.  The visualized portions of the SI joints are unremarkable.  The imaged intra-abdominal contents are unremarkable.  T12-L1: Mild broad-based disc bulge on the sagittal images. No evidence of neural foraminal  stenosis. No central canal stenosis.  L1-L2: Mild broad-based disc bulge. Moderate bilateral facet arthropathy with ligamentum flavum infolding resulting in mild spinal stenosis and lateral recess narrowing. No foraminal stenosis.  L2-L3: Moderate broad-based disc bulge flattening the ventral thecal sac. Moderate bilateral facet arthropathy. Mild spinal stenosis. Bilateral lateral recess narrowing. No foraminal stenosis.  L3-L4: Mild broad-based disc bulge. Mild bilateral facet arthropathy. No evidence of neural foraminal stenosis. No central canal stenosis.  L4-L5: Interbody fusion. No evidence of neural foraminal stenosis. No central canal stenosis.  L5-S1: Broad-based disc osteophyte complex. Mild bilateral facet arthropathy. Moderate bilateral foraminal narrowing. No central canal stenosis.  IMPRESSION: 1. No marrow signal abnormality to suggest osseous metastatic disease. 2. Diffuse lumbar spine spondylosis as described above. 3. Interbody fusion at L4-5 without recurrent foraminal or central canal stenosis. Solid osseous bridging across the L4-5 disc space.   Electronically Signed  By: Kathreen Devoid  On: 03/18/2015 10:24           Vitals     Height Weight BMI (Calculated)    5\' 6"  (1.676 m) 169 lb  6.4 oz (76.839 kg) 27.4      Interpretation Summary     CLINICAL DATA: History of prostate cancer. Mid low back pain.  EXAM: MRI LUMBAR SPINE WITHOUT AND WITH CONTRAST  TECHNIQUE: Multiplanar and multiecho pulse sequences of the lumbar spine were obtained without and with intravenous contrast.  CONTRAST: 6mL MULTIHANCE GADOBENATE DIMEGLUMINE 529 MG/ML IV SOLN  COMPARISON: Bone scan 03/03/2015, MR L-spine 05/10/2014  FINDINGS: The vertebral bodies of the lumbar spine are normal in size. The vertebral bodies of the lumbar spine are normal in alignment. There is normal bone marrow signal demonstrated throughout the  vertebra.  There is solid osseous fusion across the L4-5 disc space with posterior decompression. There is degenerative disc disease with disc height loss throughout the thoracic spine most severe at L5-S1.  There are no areas of abnormal enhancement.  The spinal cord is normal in signal and contour. The cord terminates normally at L1 . The nerve roots of the cauda equina and the filum terminale are normal.  The visualized portions of the SI joints are unremarkable.  The imaged intra-abdominal contents are unremarkable.  T12-L1: Mild broad-based disc bulge on the sagittal images. No evidence of neural foraminal stenosis. No central canal stenosis.  L1-L2: Mild broad-based disc bulge. Moderate bilateral facet arthropathy with ligamentum flavum infolding resulting in mild spinal stenosis and lateral recess narrowing. No foraminal stenosis.  L2-L3: Moderate broad-based disc bulge flattening the ventral thecal sac. Moderate bilateral facet arthropathy. Mild spinal stenosis. Bilateral lateral recess narrowing. No foraminal stenosis.  L3-L4: Mild broad-based disc bulge. Mild bilateral facet arthropathy. No evidence of neural foraminal stenosis. No central canal stenosis.  L4-L5: Interbody fusion. No evidence of neural foraminal stenosis. No central canal stenosis.  L5-S1: Broad-based disc osteophyte complex. Mild bilateral facet arthropathy. Moderate bilateral foraminal narrowing. No central canal stenosis.  IMPRESSION: 1. No marrow signal abnormality to suggest osseous metastatic disease. 2. Diffuse lumbar spine spondylosis as described above. 3. Interbody fusion at L4-5 without recurrent foraminal or central canal stenosis. Solid osseous bridging across the L4-5 disc space.        Impression and Plan:   80 year old gentleman with the following issues:  1. Prostate cancer diagnosed in December 2016. He presented with urinary retention and digital rectal  examination showed bilateral prostate nodules. He had a PSA of 2.98 on finasteride and his biopsy showed a Gleason score 4+5 = 9 and the majority of cores and the tertiary pattern of 4+4 = 8 and the remaining cores. All 12 cores involved with cancer. Imaging studies did not show any convincing evidence of metastatic disease at this time.  MRI of the lumbar and thoracic spine done on 03/18/2015 were reviewed with radiology today and showed no evidence of metastatic disease.  His case was discussed today in the prostate cancer multidisciplinary clinic and options of treatment were reviewed again with him personally. The general consensus at this time is to proceed with androgen deprivation upfront followed by TURP surgery and then definitive radiation therapy.  Radical prostatectomy option was discussed with Dr. Alinda Money today and given his age, comorbidities and aggressive nature of his cancer it is reasonable to to consider this less than an ideal option.  The rationale for using hormonal therapy in this setting was reviewed as well as the long-term complications associated with it. He is agreeable to proceed with this plan.  2. Urinary retention: Anticipate the need for a TURP procedure in the near future after hormone therapy.  [facility], MD 1/13/20179:56 AM

## 2015-04-02 NOTE — Progress Notes (Addendum)
Works as an Forensic psychologist. Married to [employer]. Has two daughters, Anderson [place] ([place]) and Colletta [place] [facility]). Reports he has had a colonoscopy in the past but, is unsure of when. Reports he does not perform routine self testicular exams. Reports his rear end aches while sitting. States, "my urination tract has shut down." Reports incontinence, pain with urination and occasional hematuria.

## 2015-04-05 ENCOUNTER — Encounter: Payer: Self-pay | Admitting: *Deleted

## 2015-04-05 DIAGNOSIS — I7090 Unspecified atherosclerosis: Secondary | ICD-10-CM | POA: Diagnosis not present

## 2015-04-05 DIAGNOSIS — H2513 Age-related nuclear cataract, bilateral: Secondary | ICD-10-CM | POA: Diagnosis not present

## 2015-04-05 DIAGNOSIS — H25013 Cortical age-related cataract, bilateral: Secondary | ICD-10-CM | POA: Diagnosis not present

## 2015-04-05 DIAGNOSIS — E119 Type 2 diabetes mellitus without complications: Secondary | ICD-10-CM | POA: Diagnosis not present

## 2015-04-05 DIAGNOSIS — H1859 Other hereditary corneal dystrophies: Secondary | ICD-10-CM | POA: Diagnosis not present

## 2015-04-06 DIAGNOSIS — N138 Other obstructive and reflux uropathy: Secondary | ICD-10-CM | POA: Diagnosis not present

## 2015-04-06 DIAGNOSIS — C61 Malignant neoplasm of prostate: Secondary | ICD-10-CM | POA: Diagnosis not present

## 2015-04-06 DIAGNOSIS — N401 Enlarged prostate with lower urinary tract symptoms: Secondary | ICD-10-CM | POA: Diagnosis not present

## 2015-04-12 ENCOUNTER — Other Ambulatory Visit: Payer: Self-pay | Admitting: Urology

## 2015-04-15 DIAGNOSIS — E1122 Type 2 diabetes mellitus with diabetic chronic kidney disease: Secondary | ICD-10-CM | POA: Diagnosis not present

## 2015-04-15 DIAGNOSIS — I2510 Atherosclerotic heart disease of native coronary artery without angina pectoris: Secondary | ICD-10-CM | POA: Diagnosis not present

## 2015-04-15 DIAGNOSIS — D649 Anemia, unspecified: Secondary | ICD-10-CM | POA: Diagnosis not present

## 2015-04-15 DIAGNOSIS — I10 Essential (primary) hypertension: Secondary | ICD-10-CM | POA: Diagnosis not present

## 2015-04-15 DIAGNOSIS — N182 Chronic kidney disease, stage 2 (mild): Secondary | ICD-10-CM | POA: Diagnosis not present

## 2015-04-15 DIAGNOSIS — R339 Retention of urine, unspecified: Secondary | ICD-10-CM | POA: Diagnosis not present

## 2015-04-15 DIAGNOSIS — Z7984 Long term (current) use of oral hypoglycemic drugs: Secondary | ICD-10-CM | POA: Diagnosis not present

## 2015-04-15 DIAGNOSIS — I340 Nonrheumatic mitral (valve) insufficiency: Secondary | ICD-10-CM | POA: Diagnosis not present

## 2015-04-15 DIAGNOSIS — C61 Malignant neoplasm of prostate: Secondary | ICD-10-CM | POA: Diagnosis not present

## 2015-04-16 ENCOUNTER — Encounter: Payer: Self-pay | Admitting: Medical Oncology

## 2015-04-16 DIAGNOSIS — N138 Other obstructive and reflux uropathy: Secondary | ICD-10-CM | POA: Diagnosis not present

## 2015-04-16 DIAGNOSIS — Z7984 Long term (current) use of oral hypoglycemic drugs: Secondary | ICD-10-CM | POA: Diagnosis not present

## 2015-04-16 DIAGNOSIS — D509 Iron deficiency anemia, unspecified: Secondary | ICD-10-CM | POA: Diagnosis not present

## 2015-04-16 DIAGNOSIS — N401 Enlarged prostate with lower urinary tract symptoms: Secondary | ICD-10-CM | POA: Diagnosis not present

## 2015-04-19 ENCOUNTER — Encounter (HOSPITAL_COMMUNITY): Payer: Self-pay

## 2015-04-19 DIAGNOSIS — N138 Other obstructive and reflux uropathy: Secondary | ICD-10-CM | POA: Diagnosis not present

## 2015-04-19 DIAGNOSIS — N401 Enlarged prostate with lower urinary tract symptoms: Secondary | ICD-10-CM | POA: Diagnosis not present

## 2015-04-19 DIAGNOSIS — R3129 Other microscopic hematuria: Secondary | ICD-10-CM | POA: Diagnosis not present

## 2015-04-19 NOTE — Patient Instructions (Addendum)
YOUR PROCEDURE IS SCHEDULED ON :  04/26/15  REPORT TO [name] MAIN ENTRANCE FOLLOW SIGNS TO EAST ELEVATOR - GO TO 3rd FLOOR CHECK IN AT 3 EAST NURSES STATION (SHORT STAY) AT:  10:15 AM  CALL THIS NUMBER IF YOU HAVE PROBLEMS THE MORNING OF SURGERY [phone]  REMEMBER:ONLY 1 PER PERSON MAY GO TO SHORT STAY WITH YOU TO GET READY THE MORNING OF YOUR SURGERY  DO NOT EAT FOOD OR DRINK LIQUIDS AFTER MIDNIGHT MAY HAVE WATER UNTIL 6:15 AM  TAKE THESE MEDICINES THE MORNING OF SURGERY: METOPROLOL / CRESTOR / TAKE 1/2 DOSE (7.5 UNITS) OF INSULIN THE NIGHT BEFORE SURGERY  YOU MAY NOT HAVE ANY METAL ON YOUR BODY INCLUDING HAIR PINS AND PIERCING'S. DO NOT WEAR JEWELRY, MAKEUP, LOTIONS, POWDERS OR PERFUMES. DO NOT WEAR NAIL POLISH. DO NOT SHAVE 48 HRS PRIOR TO SURGERY. MEN MAY SHAVE FACE AND NECK.  DO NOT [facility] IS NOT RESPONSIBLE FOR VALUABLES.  CONTACTS, DENTURES OR PARTIALS MAY NOT BE WORN TO SURGERY. LEAVE SUITCASE IN CAR. CAN BE BROUGHT TO ROOM AFTER SURGERY.  PATIENTS DISCHARGED THE DAY OF SURGERY WILL NOT BE ALLOWED TO DRIVE HOME.  PLEASE READ OVER THE FOLLOWING INSTRUCTION SHEETS _________________________________________________________________________________                                          [facility] - PREPARING FOR SURGERY  Before surgery, you can play an important role.  Because skin is not sterile, your skin needs to be as free of germs as possible.  You can reduce the number of germs on your skin by washing with CHG (chlorahexidine gluconate) soap before surgery.  CHG is an antiseptic cleaner which kills germs and bonds with the skin to continue killing germs even after washing. Please DO NOT use if you have an allergy to CHG or antibacterial soaps.  If your skin becomes reddened/irritated stop using the CHG and inform your nurse when you arrive at Short Stay. Do not shave (including legs and underarms) for at least 48  hours prior to the first CHG shower.  You may shave your face. Please follow these instructions carefully:   1.  Shower with CHG Soap the night before surgery and the  morning of Surgery.   2.  If you choose to wash your hair, wash your hair first as usual with your  normal  Shampoo.   3.  After you shampoo, rinse your hair and body thoroughly to remove the  shampoo.                                         4.  Use CHG as you would any other liquid soap.  You can apply chg directly  to the skin and wash . Gently wash with scrungie or clean wascloth    5.  Apply the CHG Soap to your body ONLY FROM THE NECK DOWN.   Do not use on open                           Wound or open sores. Avoid contact with eyes, ears mouth and genitals (private parts).  Genitals (private parts) with your normal soap.              6.  Wash thoroughly, paying special attention to the area where your surgery  will be performed.   7.  Thoroughly rinse your body with warm water from the neck down.   8.  DO NOT shower/wash with your normal soap after using and rinsing off  the CHG Soap .                9.  Pat yourself dry with a clean towel.             10.  Wear clean night clothes to bed after shower             11.  Place clean sheets on your bed the night of your first shower and do not  sleep with pets.  Day of Surgery : Do not apply any lotions/deodorants the morning of surgery.  Please wear clean clothes to the hospital/surgery center.  FAILURE TO FOLLOW THESE INSTRUCTIONS MAY RESULT IN THE CANCELLATION OF YOUR SURGERY    PATIENT SIGNATURE_________________________________  _____________________________________________________________________

## 2015-04-21 ENCOUNTER — Encounter (HOSPITAL_COMMUNITY)
Admission: RE | Admit: 2015-04-21 | Discharge: 2015-04-21 | Disposition: A | Discharge status: Home / Self Care | Payer: Medicare Other | Source: Ambulatory Visit | Attending: Urology | Admitting: Urology

## 2015-04-21 ENCOUNTER — Encounter (HOSPITAL_COMMUNITY): Payer: Self-pay

## 2015-04-21 DIAGNOSIS — Z01812 Encounter for preprocedural laboratory examination: Secondary | ICD-10-CM | POA: Diagnosis not present

## 2015-04-21 DIAGNOSIS — N320 Bladder-neck obstruction: Secondary | ICD-10-CM | POA: Diagnosis not present

## 2015-04-21 HISTORY — DX: Anemia, unspecified: D64.9

## 2015-04-21 HISTORY — DX: Bladder-neck obstruction: N32.0

## 2015-04-21 LAB — CBC
HCT: 32.5 % — ABNORMAL LOW (ref 39.0–52.0)
HEMOGLOBIN: 10.3 g/dL — AB (ref 13.0–17.0)
MCH: 27.2 pg (ref 26.0–34.0)
MCHC: 31.7 g/dL (ref 30.0–36.0)
MCV: 85.8 fL (ref 78.0–100.0)
PLATELETS: 364 10*3/uL (ref 150–400)
RBC: 3.79 MIL/uL — AB (ref 4.22–5.81)
RDW: 15.1 % (ref 11.5–15.5)
WBC: 11.8 10*3/uL — AB (ref 4.0–10.5)

## 2015-04-25 MED ORDER — DEXTROSE 5 % IV SOLN
320.0000 mg | INTRAVENOUS | Status: AC
  Administered 2015-04-26: 320 mg via INTRAVENOUS
  Filled 2015-04-25 (×2): qty 8

## 2015-04-26 ENCOUNTER — Encounter (HOSPITAL_COMMUNITY): Payer: Self-pay | Admitting: Urology

## 2015-04-26 ENCOUNTER — Inpatient Hospital Stay (HOSPITAL_COMMUNITY): Payer: Medicare Other | Admitting: Anesthesiology

## 2015-04-26 ENCOUNTER — Encounter (HOSPITAL_COMMUNITY)
Admission: RE | Disposition: A | Discharge status: Home / Self Care | Payer: Self-pay | Source: Ambulatory Visit | Attending: Urology

## 2015-04-26 ENCOUNTER — Observation Stay (HOSPITAL_COMMUNITY)
Admission: RE | Admit: 2015-04-26 | Discharge: 2015-04-27 | Disposition: A | Discharge status: Home / Self Care | Payer: Medicare Other | Source: Ambulatory Visit | Attending: Urology | Admitting: Urology

## 2015-04-26 DIAGNOSIS — E785 Hyperlipidemia, unspecified: Secondary | ICD-10-CM | POA: Insufficient documentation

## 2015-04-26 DIAGNOSIS — K219 Gastro-esophageal reflux disease without esophagitis: Secondary | ICD-10-CM | POA: Insufficient documentation

## 2015-04-26 DIAGNOSIS — N320 Bladder-neck obstruction: Secondary | ICD-10-CM | POA: Insufficient documentation

## 2015-04-26 DIAGNOSIS — I680 Cerebral amyloid angiopathy: Secondary | ICD-10-CM | POA: Insufficient documentation

## 2015-04-26 DIAGNOSIS — I25119 Atherosclerotic heart disease of native coronary artery with unspecified angina pectoris: Secondary | ICD-10-CM | POA: Diagnosis not present

## 2015-04-26 DIAGNOSIS — N138 Other obstructive and reflux uropathy: Secondary | ICD-10-CM | POA: Diagnosis not present

## 2015-04-26 DIAGNOSIS — I6529 Occlusion and stenosis of unspecified carotid artery: Secondary | ICD-10-CM | POA: Insufficient documentation

## 2015-04-26 DIAGNOSIS — E1142 Type 2 diabetes mellitus with diabetic polyneuropathy: Secondary | ICD-10-CM | POA: Insufficient documentation

## 2015-04-26 DIAGNOSIS — Z953 Presence of xenogenic heart valve: Secondary | ICD-10-CM | POA: Insufficient documentation

## 2015-04-26 DIAGNOSIS — C61 Malignant neoplasm of prostate: Secondary | ICD-10-CM | POA: Diagnosis not present

## 2015-04-26 DIAGNOSIS — I10 Essential (primary) hypertension: Secondary | ICD-10-CM | POA: Diagnosis not present

## 2015-04-26 DIAGNOSIS — Z9049 Acquired absence of other specified parts of digestive tract: Secondary | ICD-10-CM | POA: Diagnosis not present

## 2015-04-26 DIAGNOSIS — E119 Type 2 diabetes mellitus without complications: Secondary | ICD-10-CM | POA: Diagnosis not present

## 2015-04-26 DIAGNOSIS — R339 Retention of urine, unspecified: Secondary | ICD-10-CM | POA: Diagnosis not present

## 2015-04-26 DIAGNOSIS — Z951 Presence of aortocoronary bypass graft: Secondary | ICD-10-CM | POA: Diagnosis not present

## 2015-04-26 DIAGNOSIS — N529 Male erectile dysfunction, unspecified: Secondary | ICD-10-CM | POA: Diagnosis not present

## 2015-04-26 DIAGNOSIS — Z7982 Long term (current) use of aspirin: Secondary | ICD-10-CM | POA: Diagnosis not present

## 2015-04-26 DIAGNOSIS — Z87891 Personal history of nicotine dependence: Secondary | ICD-10-CM | POA: Insufficient documentation

## 2015-04-26 DIAGNOSIS — G4730 Sleep apnea, unspecified: Secondary | ICD-10-CM | POA: Insufficient documentation

## 2015-04-26 DIAGNOSIS — E1151 Type 2 diabetes mellitus with diabetic peripheral angiopathy without gangrene: Secondary | ICD-10-CM | POA: Insufficient documentation

## 2015-04-26 DIAGNOSIS — Z8673 Personal history of transient ischemic attack (TIA), and cerebral infarction without residual deficits: Secondary | ICD-10-CM | POA: Diagnosis not present

## 2015-04-26 DIAGNOSIS — N401 Enlarged prostate with lower urinary tract symptoms: Secondary | ICD-10-CM | POA: Diagnosis not present

## 2015-04-26 DIAGNOSIS — Z79899 Other long term (current) drug therapy: Secondary | ICD-10-CM | POA: Insufficient documentation

## 2015-04-26 DIAGNOSIS — Z7984 Long term (current) use of oral hypoglycemic drugs: Secondary | ICD-10-CM | POA: Diagnosis not present

## 2015-04-26 DIAGNOSIS — R338 Other retention of urine: Secondary | ICD-10-CM | POA: Diagnosis present

## 2015-04-26 HISTORY — PX: TRANSURETHRAL RESECTION OF PROSTATE: SHX73

## 2015-04-26 LAB — GLUCOSE, CAPILLARY
GLUCOSE-CAPILLARY: 171 mg/dL — AB (ref 65–99)
GLUCOSE-CAPILLARY: 193 mg/dL — AB (ref 65–99)
GLUCOSE-CAPILLARY: 250 mg/dL — AB (ref 65–99)
Glucose-Capillary: 229 mg/dL — ABNORMAL HIGH (ref 65–99)
Glucose-Capillary: 229 mg/dL — ABNORMAL HIGH (ref 65–99)

## 2015-04-26 SURGERY — TRANSURETHRAL RESECTION OF THE PROSTATE WITH GYRUS INSTRUMENTS
Anesthesia: General | Site: Prostate

## 2015-04-26 MED ORDER — DOCUSATE SODIUM 100 MG PO CAPS
100.0000 mg | ORAL_CAPSULE | Freq: Two times a day (BID) | ORAL | Status: DC
  Administered 2015-04-26 – 2015-04-27 (×3): 100 mg via ORAL
  Filled 2015-04-26 (×4): qty 1

## 2015-04-26 MED ORDER — SODIUM CHLORIDE 0.9 % IR SOLN
Status: DC | PRN
  Administered 2015-04-26: 31000 mL

## 2015-04-26 MED ORDER — FENTANYL CITRATE (PF) 100 MCG/2ML IJ SOLN
INTRAMUSCULAR | Status: AC
  Filled 2015-04-26: qty 2

## 2015-04-26 MED ORDER — OXYCODONE HCL 5 MG PO TABS
5.0000 mg | ORAL_TABLET | ORAL | Status: DC | PRN
  Administered 2015-04-26 – 2015-04-27 (×3): 10 mg via ORAL
  Filled 2015-04-26 (×4): qty 2
  Filled 2015-04-26: qty 1

## 2015-04-26 MED ORDER — 0.9 % SODIUM CHLORIDE (POUR BTL) OPTIME
TOPICAL | Status: DC | PRN
  Administered 2015-04-26: 1000 mL

## 2015-04-26 MED ORDER — STERILE WATER FOR IRRIGATION IR SOLN
Status: DC | PRN
  Administered 2015-04-26: 1000 mL

## 2015-04-26 MED ORDER — LIDOCAINE HCL (CARDIAC) 20 MG/ML IV SOLN
INTRAVENOUS | Status: AC
  Filled 2015-04-26: qty 5

## 2015-04-26 MED ORDER — CIPROFLOXACIN IN D5W 400 MG/200ML IV SOLN
INTRAVENOUS | Status: AC
  Filled 2015-04-26: qty 200

## 2015-04-26 MED ORDER — DOCUSATE SODIUM 100 MG PO CAPS: 100.0000 mg | ORAL_CAPSULE | Freq: Two times a day (BID) | ORAL | Status: DC | PRN

## 2015-04-26 MED ORDER — ONDANSETRON HCL 4 MG PO TABS
4.0000 mg | ORAL_TABLET | Freq: Three times a day (TID) | ORAL | Status: DC | PRN
  Administered 2015-04-27: 4 mg via ORAL
  Filled 2015-04-26: qty 1

## 2015-04-26 MED ORDER — LIDOCAINE HCL 2 % EX GEL
CUTANEOUS | Status: DC | PRN
  Administered 2015-04-26: 1 via URETHRAL

## 2015-04-26 MED ORDER — BACITRACIN-NEOMYCIN-POLYMYXIN 400-5-5000 EX OINT: 1.0000 "application " | TOPICAL_OINTMENT | Freq: Three times a day (TID) | CUTANEOUS | Status: DC | PRN

## 2015-04-26 MED ORDER — LACTATED RINGERS IV SOLN
INTRAVENOUS | Status: DC
  Administered 2015-04-26: 14:00:00 via INTRAVENOUS
  Administered 2015-04-26: 1000 mL via INTRAVENOUS

## 2015-04-26 MED ORDER — PROPOFOL 10 MG/ML IV BOLUS
INTRAVENOUS | Status: AC
  Filled 2015-04-26: qty 20

## 2015-04-26 MED ORDER — FENTANYL CITRATE (PF) 100 MCG/2ML IJ SOLN
25.0000 ug | INTRAMUSCULAR | Status: DC | PRN
  Administered 2015-04-26: 50 ug via INTRAVENOUS

## 2015-04-26 MED ORDER — ZOLPIDEM TARTRATE 5 MG PO TABS
5.0000 mg | ORAL_TABLET | Freq: Every evening | ORAL | Status: DC | PRN
  Administered 2015-04-26: 5 mg via ORAL
  Filled 2015-04-26: qty 1

## 2015-04-26 MED ORDER — CIPROFLOXACIN HCL 500 MG PO TABS
500.0000 mg | ORAL_TABLET | Freq: Two times a day (BID) | ORAL | Status: DC
  Administered 2015-04-26 – 2015-04-27 (×2): 500 mg via ORAL
  Filled 2015-04-26 (×2): qty 1

## 2015-04-26 MED ORDER — SODIUM CHLORIDE 0.45 % IV SOLN
INTRAVENOUS | Status: DC
  Administered 2015-04-26: 16:00:00 via INTRAVENOUS

## 2015-04-26 MED ORDER — METOPROLOL TARTRATE 25 MG PO TABS: 25.0000 mg | ORAL_TABLET | Freq: Every day | ORAL | Status: DC

## 2015-04-26 MED ORDER — INSULIN ASPART 100 UNIT/ML ~~LOC~~ SOLN
0.0000 [IU] | SUBCUTANEOUS | Status: DC
  Administered 2015-04-26: 3 [IU] via SUBCUTANEOUS
  Administered 2015-04-26: 5 [IU] via SUBCUTANEOUS
  Administered 2015-04-27: 2 [IU] via SUBCUTANEOUS
  Administered 2015-04-27: 3 [IU] via SUBCUTANEOUS
  Administered 2015-04-27: 2 [IU] via SUBCUTANEOUS

## 2015-04-26 MED ORDER — PROPOFOL 10 MG/ML IV BOLUS
INTRAVENOUS | Status: DC | PRN
  Administered 2015-04-26: 170 mg via INTRAVENOUS

## 2015-04-26 MED ORDER — HYDRALAZINE HCL 20 MG/ML IJ SOLN
5.0000 mg | INTRAMUSCULAR | Status: DC | PRN
Start: 2015-04-26 — End: 2015-04-27

## 2015-04-26 MED ORDER — AMLODIPINE BESYLATE 2.5 MG PO TABS
2.5000 mg | ORAL_TABLET | Freq: Every evening | ORAL | Status: DC
  Administered 2015-04-26: 2.5 mg via ORAL
  Filled 2015-04-26 (×2): qty 1

## 2015-04-26 MED ORDER — MENTHOL 3 MG MT LOZG
1.0000 | LOZENGE | OROMUCOSAL | Status: DC | PRN
  Administered 2015-04-27: 3 mg via ORAL
  Filled 2015-04-26 (×3): qty 9

## 2015-04-26 MED ORDER — NITROGLYCERIN 0.4 MG SL SUBL: 0.4000 mg | SUBLINGUAL_TABLET | SUBLINGUAL | Status: DC | PRN

## 2015-04-26 MED ORDER — ACETAMINOPHEN 325 MG PO TABS: 650.0000 mg | ORAL_TABLET | ORAL | Status: DC | PRN

## 2015-04-26 MED ORDER — METOPROLOL TARTRATE 25 MG PO TABS
12.5000 mg | ORAL_TABLET | Freq: Every day | ORAL | Status: DC
  Administered 2015-04-27: 12.5 mg via ORAL
  Filled 2015-04-26 (×2): qty 1

## 2015-04-26 MED ORDER — FENTANYL CITRATE (PF) 100 MCG/2ML IJ SOLN
INTRAMUSCULAR | Status: DC | PRN
  Administered 2015-04-26 (×2): 25 ug via INTRAVENOUS

## 2015-04-26 MED ORDER — ONDANSETRON HCL 4 MG/2ML IJ SOLN
INTRAMUSCULAR | Status: AC
  Filled 2015-04-26: qty 2

## 2015-04-26 MED ORDER — FLUOXETINE HCL 20 MG PO CAPS
20.0000 mg | ORAL_CAPSULE | Freq: Every day | ORAL | Status: DC
  Administered 2015-04-26: 20 mg via ORAL
  Filled 2015-04-26: qty 1

## 2015-04-26 MED ORDER — PHENOL 1.4 % MT LIQD
1.0000 | OROMUCOSAL | Status: DC | PRN
  Filled 2015-04-26: qty 177

## 2015-04-26 MED ORDER — LIDOCAINE HCL (CARDIAC) 20 MG/ML IV SOLN
INTRAVENOUS | Status: DC | PRN
  Administered 2015-04-26: 100 mg via INTRAVENOUS

## 2015-04-26 MED ORDER — LIDOCAINE HCL 2 % EX GEL
CUTANEOUS | Status: AC
  Filled 2015-04-26: qty 5

## 2015-04-26 MED ORDER — CIPROFLOXACIN HCL 500 MG PO TABS: 500.0000 mg | ORAL_TABLET | Freq: Two times a day (BID) | ORAL | Status: DC

## 2015-04-26 MED ORDER — CIPROFLOXACIN IN D5W 400 MG/200ML IV SOLN
400.0000 mg | INTRAVENOUS | Status: AC
  Administered 2015-04-26: 400 mg via INTRAVENOUS

## 2015-04-26 MED ORDER — OXYCODONE HCL 5 MG PO TABS: 5.0000 mg | ORAL_TABLET | ORAL | Status: DC | PRN

## 2015-04-26 MED ORDER — BELLADONNA ALKALOIDS-OPIUM 16.2-60 MG RE SUPP
RECTAL | Status: AC
  Filled 2015-04-26: qty 1

## 2015-04-26 MED ORDER — METFORMIN HCL ER 500 MG PO TB24
1000.0000 mg | ORAL_TABLET | Freq: Two times a day (BID) | ORAL | Status: DC
  Administered 2015-04-26 – 2015-04-27 (×2): 1000 mg via ORAL
  Filled 2015-04-26 (×3): qty 2

## 2015-04-26 MED ORDER — ONDANSETRON HCL 4 MG/2ML IJ SOLN
INTRAMUSCULAR | Status: DC | PRN
  Administered 2015-04-26: 4 mg via INTRAVENOUS

## 2015-04-26 MED ORDER — ROSUVASTATIN CALCIUM 10 MG PO TABS
10.0000 mg | ORAL_TABLET | Freq: Every morning | ORAL | Status: DC
  Administered 2015-04-27: 10 mg via ORAL
  Filled 2015-04-26: qty 1

## 2015-04-26 MED ORDER — SODIUM CHLORIDE 0.9 % IR SOLN
3000.0000 mL | Status: DC
  Administered 2015-04-26: 3000 mL

## 2015-04-26 MED ORDER — BELLADONNA ALKALOIDS-OPIUM 16.2-60 MG RE SUPP
RECTAL | Status: DC | PRN
  Administered 2015-04-26: 1 via RECTAL

## 2015-04-26 MED ORDER — CETYLPYRIDINIUM CHLORIDE 0.05 % MT LIQD
7.0000 mL | Freq: Two times a day (BID) | OROMUCOSAL | Status: DC
  Administered 2015-04-26 – 2015-04-27 (×2): 7 mL via OROMUCOSAL

## 2015-04-26 SURGICAL SUPPLY — 23 items
BAG URINE DRAINAGE (UROLOGICAL SUPPLIES) ×3 IMPLANT
BAG URO CATCHER STRL LF (MISCELLANEOUS) ×3 IMPLANT
BLADE SURG 15 STRL LF DISP TIS (BLADE) IMPLANT
BLADE SURG 15 STRL SS (BLADE)
CATH FOLEY 3WAY 30CC 22FR (CATHETERS) ×3 IMPLANT
CATH FOLEY 3WAY 30CC 24FR (CATHETERS) ×3
CATH URTH STD 24FR FL 3W 2 (CATHETERS) IMPLANT
ELECT REM PT RETURN 9FT ADLT (ELECTROSURGICAL) ×3
ELECTRODE REM PT RTRN 9FT ADLT (ELECTROSURGICAL) ×1 IMPLANT
EVACUATOR MICROVAS BLADDER (UROLOGICAL SUPPLIES) ×3 IMPLANT
GLOVE BIOGEL M STRL SZ7.5 (GLOVE) ×3 IMPLANT
GOWN STRL REUS W/ TWL XL LVL3 (GOWN DISPOSABLE) ×1 IMPLANT
GOWN STRL REUS W/TWL XL LVL3 (GOWN DISPOSABLE) ×6 IMPLANT
HOLDER FOLEY CATH W/STRAP (MISCELLANEOUS) IMPLANT
IV NS IRRIG 3000ML ARTHROMATIC (IV SOLUTION) ×12 IMPLANT
KIT ASPIRATION TUBING (SET/KITS/TRAYS/PACK) ×3 IMPLANT
LOOP CUT BIPOLAR 24F LRG (ELECTROSURGICAL) ×2 IMPLANT
MANIFOLD NEPTUNE II (INSTRUMENTS) ×3 IMPLANT
PACK CYSTO (CUSTOM PROCEDURE TRAY) ×3 IMPLANT
SUT ETHILON 3 0 PS 1 (SUTURE) IMPLANT
SYR 30ML LL (SYRINGE) ×2 IMPLANT
TUBING CONNECTING 10 (TUBING) ×2 IMPLANT
TUBING CONNECTING 10' (TUBING) ×1

## 2015-04-26 NOTE — Op Note (Signed)
Preoperative diagnosis:  1. High-grade prostate cancer 2. Bladder outlet obstruction 3. Urinary retention   Postoperative diagnosis:  1. Same   Procedure: 1. Transurethral resection of prostate  Surgeon: Ardis Hughs, MD  Anesthesia: General  Complications: None  Intraoperative findings: The patient's prostatic urethra appeared to have visually have abnormal tissue/cancer emanating from the walls of the prostate in several areas, particularly the right prostatic urethra at the 7:00 position. At the end of the case, with Cred, the patient was clearly unobstructed. Care was taken to avoid the over resection at prostatic apex and attempt to prevent long-term urinary incontinence.  EBL: Minimal  Specimens: Prostate chips  Indication: [name] is a 80 y.o. patient with urinary retention, likely secondary to advanced high-grade prostate cancer.  After reviewing the management options for treatment, he elected to proceed with the above surgical procedure(s). We have discussed the potential benefits and risks of the procedure, side effects of the proposed treatment, the likelihood of the patient achieving the goals of the procedure, and any potential problems that might occur during the procedure or recuperation. Informed consent has been obtained.  Description of procedure:  The patient was taken to the operating room and general anesthesia was induced.  The patient was placed in the dorsal lithotomy position, prepped and draped in the usual sterile fashion, and preoperative antibiotics were administered. A preoperative time-out was performed.   A 21 French 30 cystoscope was then gently passed into the patient's urethra and in the bladder. The bladder neck was edematous, but the ureteral orifices were identifiable and in orthotopic position. The bladder mucosa was irritated from a chronic indwelling Foley. There were no significant or obvious mucosal lesions. The 2 French  scope was then removed and exchanged for the 26 French resectoscope sheath that was passed in under visual guidance. The obturator was then exchanged for the loop. I then proceeded with a routine transurethral resection of the prostate starting at the 7:00 position and creating a groove from the bladder neck down to the prostatic apex. I then proceeded to resect up to approximately 11:00, to the prostatic capsule. I then repeated this process on the patient's left side. I then focused my attention on the posterior aspect of the prostatic urethra which was noted to be extensively involved with the prostate cancer. Once a adequate channel had been established and any arterial bleeding controlled I emptied all the prostatic chips out with a Ellik. I then reintroduced the scope and again ensured adequate hemostasis. I then left the patient's bladder full and removed the scope. I then performed Cred maneuver and the patient was noted to have a excellent stream. I then passed a 24 [place] 3-way Foley catheter into the patient's bladder returning clear reflux. A 30 mL syringe was then used to instill 30 mL of sterile water into the patient's Foley catheter balloon. The catheter was connected to the normal saline irrigation and continuous bladder irrigation ensued. I then performed an exam under anesthesia demonstrating a 40-50 g prostate with a nodule on the right extending from the apex to the base. Prior to instilling the catheter I pushed 10 mL of 2% lidocaine jelly into the patient's urethra. I inserted a B&O dose suppository into the patient's rectum. The patient was subsequently awoken and returned to the PACU in stable condition.  Ardis Hughs, M.D.

## 2015-04-26 NOTE — Anesthesia Preprocedure Evaluation (Signed)
Anesthesia Evaluation  Patient identified by MRN, date of birth, ID band Patient awake    Reviewed: Allergy & Precautions, H&P , NPO status , Patient's Chart, lab work & pertinent test results  Airway Mallampati: II  TM Distance: >3 FB Neck ROM: full    Dental  (+) Missing, Dental Advisory Given Lower side teeth missing:   Pulmonary sleep apnea , former smoker,    Pulmonary exam normal breath sounds clear to auscultation       Cardiovascular hypertension, Pt. on medications + angina with exertion + CAD, + Cardiac Stents, + CABG and + Peripheral Vascular Disease  Normal cardiovascular exam+ Valvular Problems/Murmurs  Rhythm:regular Rate:Normal  Avr. MVR   Neuro/Psych cea negative psych ROS   GI/Hepatic negative GI ROS, Neg liver ROS,   Endo/Other  diabetes, Well Controlled, Type 2, Oral Hypoglycemic Agents  Renal/GU negative Renal ROS  negative genitourinary   Musculoskeletal   Abdominal   Peds  Hematology negative hematology ROS (+)   Anesthesia Other Findings   Reproductive/Obstetrics negative OB ROS                             Anesthesia Physical Anesthesia Plan  ASA: III  Anesthesia Plan: General   Post-op Pain Management:    Induction: Intravenous  Airway Management Planned: LMA  Additional Equipment:   Intra-op Plan:   Post-operative Plan:   Informed Consent: I have reviewed the patients History and Physical, chart, labs and discussed the procedure including the risks, benefits and alternatives for the proposed anesthesia with the patient or authorized representative who has indicated his/her understanding and acceptance.   Dental Advisory Given  Plan Discussed with: CRNA and Surgeon  Anesthesia Plan Comments:         Anesthesia Quick Evaluation

## 2015-04-26 NOTE — Discharge Instructions (Signed)
Transurethral Resection of the Prostate (TURP) or Greenlight laser ablation of the Prostate ° °Care After ° °Refer to this sheet in the next few weeks. These discharge instructions provide you with general information on caring for yourself after you leave the hospital. Your caregiver may also give you specific instructions. Your treatment has been planned according to the most current medical practices available, but unavoidable complications sometimes occur. If you have any problems or questions after discharge, please call your caregiver. ° °HOME CARE INSTRUCTIONS  ° °Medications °· You may receive medicine for pain management. As your level of discomfort decreases, adjustments in your pain medicines may be made.  °· Take all medicines as directed.  °· You may be given a medicine (antibiotic) to kill germs following surgery. Finish all medicines. Let your caregiver know if you have any side effects or problems from the medicine.  °· If you are on aspirin, it would be best not to restart the aspirin until the blood in the urine clears °Hygiene °· You can take a shower after surgery.  °· You should not take a bath while you still have the urethral catheter. °Activity °· You will be encouraged to get out of bed as much as possible and increase your activity level as tolerated.  °· Spend the first week in and around your home. For 3 weeks, avoid the following:  °· Straining.  °· Running.  °· Strenuous work.  °· Walks longer than a few blocks.  °· Riding for extended periods.  °· Sexual relations.  °· Do not lift heavy objects (more than 20 pounds) for at least 1 month. When lifting, use your arms instead of your abdominal muscles.  °· You will be encouraged to walk as tolerated. Do not exert yourself. Increase your activity level slowly. Remember that it is important to keep moving after an operation of any type. This cuts down on the possibility of developing blood clots.  °· Your caregiver will tell you when you  can resume driving and light housework. Discuss this at your first office visit after discharge. °Diet °· No special diet is ordered after a TURP. However, if you are on a special diet for another medical problem, it should be continued.  °· Normal fluid intake is usually recommended.  °· Avoid alcohol and caffeinated drinks for 2 weeks. They irritate the bladder. Decaffeinated drinks are okay.  °· Avoid spicy foods.  °Bladder Function °· For the first 10 days, empty the bladder whenever you feel a definite desire. Do not try to hold the urine for long periods of time.  °· Urinating once or twice a night even after you are healed is not uncommon.  °· You may see some recurrence of blood in the urine after discharge from the hospital. This usually happens within 2 weeks after the procedure.If this occurs, force fluids again as you did in the hospital and reduce your activity.  °Bowel Function °· You may experience some constipation after surgery. This can be minimized by increasing fluids and fiber in your diet. Drink enough water and fluids to keep your urine clear or pale yellow.  °· A stool softener may be prescribed for use at home. Do not strain to move your bowels.  °· If you are requiring increased pain medicine, it is important that you take stool softeners to prevent constipation. This will help to promote proper healing by reducing the need to strain to move your bowels.  °Sexual Activity °· Semen movement   after intercourse. Or, you may not have an ejaculation during erection. Ask your caregiver when you can resume sexual activity. Retrograde ejaculation and reduced semen discharge should not reduce one's pleasure of intercourse.  Postoperative Visit Arrange the date and time of your after surgery visit with your caregiver.  Return to Work After your recovery is complete, you will  be able to return to work and resume all activities. Your caregiver will inform you when you can return to work.  

## 2015-04-26 NOTE — H&P (Signed)
History of Present Illness [name] is a 80 year old gentleman with a past medical history significant for coronary artery disease s/p CABG in 2012, stroke, peripheral vascular disease s/p CEA, diabetes, hyperlipidemia, hypertension, extensive smoking history (4 PPD for 25 years prior to quitting over 40 years ago), and sleep apnea. He has been followed by Dr. Louis Meckel for multiple urologic issues since 2014 including hematuria with a negative urologic evaluation and progressive lower urinary tract symptoms including frequency, urgency, nocturia, and sense of incomplete emptying. He was treated for BPH with tamsulosin and finasteride with initial improvement but continued progressive symptoms. He failed other medical therapies (B-3 agonist) and due to his progressive bothersome symptoms and eventual development of urinary retention in November 2016, he was counseled about surgical treatment options for BPH. He was seen by Dr. Gaynelle Arabian in November 2016 to consider a Urolift procedure and he was noted to have right base prostate nodule that was new. His PSA at that time was 2.98 confounded by his catheter placement and finasteride use.     He underwent a TRUS biopsy of the prostate by Dr. Louis Meckel on 02/22/15 that confirmed Gleason 4+5=9 adenocarcinoma of the prostate in 13 out of 13 biopsy cores. He underwent staging studies including a CT scan of the pelvis on 03/03/15 that did demonstrate some small pelvic side wall lymph nodes although none that were pathologically enlarged. His bone scan was performed on 03/03/15 and demonstrated some areas of uptake along the thoracic and lumbar spine and although metastatic disease could not be excluded, these areas were felt to be most consistent with degenerative changes. An MRI of the thoracic and lumbar spine also demonstrated changes that were consistent with degenerative changes but no lesions suspicious for metastases.    Urinary function: He has severe LUTS  refractory to medical therapy and now with urinary retention.  Erectile function: He has a history of erectile dysfunction and this is not a priority to him or his wife.     Interval: The patient was seen by the multidisciplinary high-risk prostate cancer clinic. It was recommended that he get external beam radiation therapy for his prostate cancer. Prior to him initiating the radiation therapy, it was recommended that he be treated for his bladder outlet obstruction. The recommendation was to administer a Lupron injection lasting 6 months followed by a bladder procedure. The patient would then get fiducial markers 6 weeks after that followed by radiation and the mid spring. The patient is doing reasonably well. He is bothered by the Foley catheter experiencing rectal pain when sitting. He is also noted some discomfort at the tip of his penis. He is here to have this removed. It was last changed 3 weeks ago.   Past Medical History Problems  1. History of Coronary artery disease (I25.10) 2. History of diabetes mellitus (Z86.39) 3. History of esophageal reflux (Z87.19) 4. History of hypercholesterolemia (Z86.39) 5. History of hypertension (Z86.79) 6. History of sleep apnea (Z86.69) 7. History of Stroke syndrome (I63.9)  Surgical History Problems  1. History of Appendectomy 2. History of Back Surgery 3. History of CABG 4. History of Carotid Thromboendarterectomy 5. History of Shoulder Surgery 6. History of Tonsillectomy  Current Meds 1. AmLODIPine Besylate TABS;  Therapy: (Recorded:18Sep2014) to Recorded 2. Aspirin 81 MG TABS;  Therapy: (Recorded:18Sep2014) to Recorded 3. Crestor TABS (Rosuvastatin Calcium);  Therapy: (Recorded:18Sep2014) to Recorded 4. Finasteride 5 MG Oral Tablet; TAKE ONE TABLET BY MOUTH  DAILY;  Therapy: 31Aug2015 to (Evaluate:13Jan2017)  Requested for:  [id number]; Last  Rx:15Sep2016 Ordered 5. FLUoxetine HCl TABS;  Therapy: (Recorded:18Sep2014) to Recorded 6.  Januvia TABS;  Therapy: (Recorded:23Aug2016) to Recorded 7. Melatonin TABS;  Therapy: (Recorded:18Sep2014) to Recorded 8. MetFORMIN HCl - 500 MG Oral Tablet;  Therapy: (Recorded:03May2016) to Recorded 9. Metoprolol Succinate ER 25 MG Oral Tablet Extended Release 24 Hour; 40m in am,  599mpm;  Therapy: (Recorded:28Apr2015) to Recorded 10. Omeprazole CPDR;   Therapy: (Recorded:19Oct2015) to Recorded 11. Tamsulosin HCl - 0.4 MG Oral Capsule; TAKE 2 CAPSULE hs;   Therapy: [id number] (Evaluate:21May2017)  Requested for: 22Nov2016; Last   Rx:22Nov2016; Status: ACTIVE - Renewal Denied Ordered  Allergies Medication  1. No Known Drug Allergies  Family History Problems  1. Family history of Death In The Family Father 2. Family history of Death In The Family Mother 3. Family history of Family Health Status Number Of Children   2 daughters 4.36Family history of cardiac disorder (Z82.49) : Mother 5. Family history of diabetes mellitus (Z83.3) : Mother  Social History Problems  1. Denied: History of Alcohol Use (History) 2. Caffeine Use 3. Former smoker ([phone]  smoked 4-6 ppd for 23 years quit for 34 years 4. Marital History - Currently Married 5. Occupation:   attorney  Review of Systems No changes in pts bowel habits, neurological changes, or progressive lower urinary tract symptoms.      Vitals Vital Signs [Data Includes: Last 1 Day]  Recorded: [id number]:13PM  Height: 5 ft 6 in Weight: 165 lb  BMI Calculated: 26.63 BSA Calculated: 1.84 Blood Pressure: 109 / 61 Temperature: 97.4 F Heart Rate: 79  Physical Exam No acute distress  The patient has a very audible systolic ejection murmur, his heart rate is regular  The lung sounds are clear to auscultation in the posterior lobes bilaterally.   Procedure In the routine standard fashion and the Foley catheter was removed. The patient was then prepped and draped in the sterile fashion and a repeat 1664rench  Foley catheter was advanced into the urethra and up into the prostate without issue. There were some small clots that were irrigated out initially, this cleared quickly.  The patient was also administered 45 mg of IM Lupron into the gluteal muscle.   Assessment Assessed  1. Prostate cancer (C61) 2. Benign prostatic hyperplasia with urinary obstruction (N40.1,N13.8)  #1 high-risk prostate cancer  #2 bladder L obstruction   Plan Prostate cancer  1. Administered: Lupron Dep[place] 45 MG Intramuscular Kit  Discussion/Summary #1 prostate cancer: The patient has received a 6 month Lupron injection today. We will plan to place fiducial markers 6-8 weeks following his bladder outlet procedure. He will then follow up with radiation oncology.  #2 bladder outlet obstruction: After going over the treatment options with the patient, we have opted to proceed with transurethral resection of the prostate. I detailed this operation for the patient and his wife in quite some detail. I went over the risks and the benefits of it. The patient understands that this is an inpatient procedure, will require at least one overnight stay. We may be able to remove his catheter the next day. We'll try to get this arranged as soon as possible.

## 2015-04-26 NOTE — Transfer of Care (Signed)
Immediate Anesthesia Transfer of Care Note  Patient: [name]  Procedure(s) Performed: Procedure(s) with comments: TRANSURETHRAL RESECTION OF THE PROSTATE WITH GYRUS INSTRUMENTS (N/A) - TURP-BIPOLAR  Patient Location: PACU  Anesthesia Type:General  Level of Consciousness: sedated  Airway & Oxygen Therapy: Patient Spontanous Breathing and Patient connected to face mask oxygen  Post-op Assessment: Report given to RN and Post -op Vital signs reviewed and stable  Post vital signs: Reviewed and stable  Last Vitals:  Filed Vitals:   04/26/15 0955  BP: 135/63  Pulse: 60  Temp: 36.3 C  Resp: 18    Complications: No apparent anesthesia complications

## 2015-04-26 NOTE — Progress Notes (Signed)
Received pt from PACU, f/c to CBI intact, telemetry and SCDs applied, VS obtained, oriented to unit, call light placed with in reach

## 2015-04-26 NOTE — Anesthesia Postprocedure Evaluation (Signed)
Anesthesia Post Note  Patient: [name]  Procedure(s) Performed: Procedure(s) (LRB): TRANSURETHRAL RESECTION OF THE PROSTATE WITH GYRUS INSTRUMENTS (N/A)  Patient location during evaluation: PACU Anesthesia Type: General Level of consciousness: awake and alert Pain management: pain level controlled Vital Signs Assessment: post-procedure vital signs reviewed and stable Respiratory status: spontaneous breathing, nonlabored ventilation, respiratory function stable and patient connected to nasal cannula oxygen Cardiovascular status: blood pressure returned to baseline and stable Postop Assessment: no signs of nausea or vomiting Anesthetic complications: no    Last Vitals:  Filed Vitals:   04/26/15 1430 04/26/15 1449  BP: 133/54 126/59  Pulse: 55 58  Temp: 37.1 C 36.6 C  Resp: 11 16    Last Pain:  Filed Vitals:   04/26/15 1536  PainSc: 8                  Thomson Herbers L

## 2015-04-26 NOTE — Interval H&P Note (Signed)
History and Physical Interval Note: Cardiac Clearance from Dr. Laurann Montana.  He also has been treated for a providencia rettgeri UTI. No changes to his H&P. 04/26/2015 6:33 AM  [name]  has presented today for surgery, with the diagnosis of BLADDER OUTLET OBSTRUCTION  The various methods of treatment have been discussed with the patient and family. After consideration of risks, benefits and other options for treatment, the patient has consented to  Procedure(s) with comments: [place] (N/A) - TURP-BIPOLAR as a surgical intervention .  The patient's history has been reviewed, patient examined, no change in status, stable for surgery.  I have reviewed the patient's chart and labs.  Questions were answered to the patient's satisfaction.     Louis Meckel W

## 2015-04-27 ENCOUNTER — Encounter: Payer: Self-pay | Admitting: Medical Oncology

## 2015-04-27 DIAGNOSIS — R339 Retention of urine, unspecified: Secondary | ICD-10-CM | POA: Diagnosis not present

## 2015-04-27 DIAGNOSIS — E1142 Type 2 diabetes mellitus with diabetic polyneuropathy: Secondary | ICD-10-CM | POA: Diagnosis not present

## 2015-04-27 DIAGNOSIS — C61 Malignant neoplasm of prostate: Secondary | ICD-10-CM | POA: Diagnosis not present

## 2015-04-27 DIAGNOSIS — E785 Hyperlipidemia, unspecified: Secondary | ICD-10-CM | POA: Diagnosis not present

## 2015-04-27 DIAGNOSIS — N320 Bladder-neck obstruction: Secondary | ICD-10-CM | POA: Diagnosis not present

## 2015-04-27 DIAGNOSIS — E1151 Type 2 diabetes mellitus with diabetic peripheral angiopathy without gangrene: Secondary | ICD-10-CM | POA: Diagnosis not present

## 2015-04-27 LAB — CBC
HCT: 29.2 % — ABNORMAL LOW (ref 39.0–52.0)
Hemoglobin: 9.3 g/dL — ABNORMAL LOW (ref 13.0–17.0)
MCH: 27.6 pg (ref 26.0–34.0)
MCHC: 31.8 g/dL (ref 30.0–36.0)
MCV: 86.6 fL (ref 78.0–100.0)
PLATELETS: 246 10*3/uL (ref 150–400)
RBC: 3.37 MIL/uL — AB (ref 4.22–5.81)
RDW: 15.1 % (ref 11.5–15.5)
WBC: 12.2 10*3/uL — AB (ref 4.0–10.5)

## 2015-04-27 LAB — BASIC METABOLIC PANEL
Anion gap: 7 (ref 5–15)
BUN: 13 mg/dL (ref 6–20)
CHLORIDE: 107 mmol/L (ref 101–111)
CO2: 27 mmol/L (ref 22–32)
CREATININE: 0.98 mg/dL (ref 0.61–1.24)
Calcium: 8.8 mg/dL — ABNORMAL LOW (ref 8.9–10.3)
GFR calc non Af Amer: >60 mL/min (ref >60)
Glucose, Bld: 139 mg/dL — ABNORMAL HIGH (ref 65–99)
Potassium: 4.1 mmol/L (ref 3.5–5.1)
SODIUM: 141 mmol/L (ref 135–145)

## 2015-04-27 LAB — GLUCOSE, CAPILLARY
GLUCOSE-CAPILLARY: 131 mg/dL — AB (ref 65–99)
Glucose-Capillary: 138 mg/dL — ABNORMAL HIGH (ref 65–99)

## 2015-04-27 MED ORDER — PROMETHAZINE HCL 12.5 MG PO TABS: 12.5000 mg | ORAL_TABLET | Freq: Four times a day (QID) | ORAL | Status: DC | PRN

## 2015-04-27 NOTE — Progress Notes (Signed)
Foley catheter removed per MD order at [facility]. Educated on pt on importance to urinate on his own. Will monitor for urine output.

## 2015-04-27 NOTE — Discharge Summary (Signed)
Date of admission: 04/26/2015  Date of discharge: 04/27/2015  Admission diagnosis: acute urinary retention, high grade prostate cancer  Discharge diagnosis: same, s/p TURP  Secondary diagnoses:  Patient Active Problem List   Diagnosis Date Noted  . Acute urinary retention 04/26/2015  . Prostate cancer ([place])   . Type 2 diabetes mellitus with diabetic polyneuropathy ([place]) 08/21/2014  . Cerebral amyloid angiopathy 08/21/2014  . S/P mitral valve replacement with bioprosthetic valve 02/20/2014  . Essential hypertension, benign 05/10/2012  . CVA (cerebral infarction) 05/09/2012  . Occlusion and stenosis of carotid artery without mention of cerebral infarction 05/19/2011  . CAD (coronary artery disease) 11/08/2010    History and Physical: For full details, please see admission history and physical. Briefly, [name] is a 80 y.o. year old patient with urinary retention and advanced prostate cancer.   Hospital Course: Patient tolerated the procedure well.  He was then transferred to the floor after an uneventful PACU stay.  His hospital course was uncomplicated.  On POD#1  he had met discharge criteria: was eating a regular diet, was up and ambulating independently,  pain was well controlled, was voiding without a catheter, and was ready to for discharge.   Laboratory values:   Recent Labs  04/27/15 0601  WBC 12.2*  HGB 9.3*  HCT 29.2*    Recent Labs  04/27/15 0601  NA 141  K 4.1  CL 107  CO2 27  GLUCOSE 139*  BUN 13  CREATININE 0.98  CALCIUM 8.8*   No results for input(s): LABPT, INR in the last 72 hours. No results for input(s): LABURIN in the last 72 hours. Results for orders placed or performed during the hospital encounter of 05/15/12  Culture, blood (routine x 2)     Status: None   Collection Time: 05/15/12  4:25 PM  Result Value Ref Range Status   Specimen Description BLOOD ARM LEFT  Final   Special Requests BOTTLES DRAWN AEROBIC ONLY [facility]  Final   Culture   Setup Time 05/15/2012 21:19  Final   Culture NO GROWTH 5 DAYS  Final   Report Status 05/21/2012 FINAL  Final  Culture, blood (routine x 2)     Status: None   Collection Time: 05/15/12  4:30 PM  Result Value Ref Range Status   Specimen Description BLOOD HAND LEFT  Final   Special Requests BOTTLES DRAWN AEROBIC ONLY [facility]  Final   Culture  Setup Time 05/15/2012 21:19  Final   Culture NO GROWTH 5 DAYS  Final   Report Status 05/21/2012 FINAL  Final    Disposition: Home  Discharge instruction: The patient was instructed to be ambulatory but told to refrain from heavy lifting, strenuous activity, or driving.   Discharge medications:   Medication List    STOP taking these medications        aspirin 325 MG tablet      TAKE these medications        amLODipine 5 MG tablet  Commonly known as:  NORVASC  Take 2.5 mg by mouth every evening.     BASAGLAR KWIKPEN 100 UNIT/ML Solostar Pen  Generic drug:  Insulin Glargine  Inject 15 Units into the skin every evening.     ciprofloxacin 500 MG tablet  Commonly known as:  CIPRO  Take 1 tablet (500 mg total) by mouth 2 (two) times daily.     docusate sodium 100 MG capsule  Commonly known as:  COLACE  Take 1 capsule (100 mg total) by  mouth 2 (two) times daily as needed for mild constipation.     FLUoxetine 20 MG capsule  Commonly known as:  PROZAC  Take 20 mg by mouth at bedtime.     Melatonin 3 MG Tabs  Take 1 tablet by mouth at bedtime.     metFORMIN 1000 MG (MOD) 24 hr tablet  Commonly known as:  GLUMETZA  Take 1 tablet (1,000 mg total) by mouth 2 (two) times daily with a meal.     metoprolol tartrate 25 MG tablet  Commonly known as:  LOPRESSOR  Take 12.5-25 mg by mouth 2 (two) times daily. 1/2 tablet in the morning, 1 tablet in the evening     MULTIVITAMIN PO  Take 0.5 tablets by mouth every morning.     nitroGLYCERIN 0.4 MG SL tablet  Commonly known as:  NITROSTAT  Place 1 tablet (0.4 mg total) under the tongue every 5  (five) minutes as needed. For chest pain     oxyCODONE 5 MG immediate release tablet  Commonly known as:  Oxy IR/ROXICODONE  Take 1-2 tablets (5-10 mg total) by mouth every 4 (four) hours as needed for severe pain.     promethazine 12.5 MG tablet  Commonly known as:  PHENERGAN  Take 1 tablet (12.5 mg total) by mouth every 6 (six) hours as needed for nausea or vomiting.     rosuvastatin 10 MG tablet  Commonly known as:  CRESTOR  Take 10 mg by mouth every morning.     sitaGLIPtin 100 MG tablet  Commonly known as:  JANUVIA  Take 100 mg by mouth at bedtime.     vitamin C 500 MG tablet  Commonly known as:  ASCORBIC ACID  Take 500 mg by mouth 2 (two) times daily.        Followup:      Follow-up Information    Follow up with Ardis Hughs, MD On 05/17/2015.   Specialty:  Urology   Why:  10:45 am   Contact information:   [place]  [phone]

## 2015-04-27 NOTE — Progress Notes (Signed)
Oncology Nurse Navigator Documentation  Oncology Nurse Navigator Flowsheets 04/02/2015 04/12/2015 04/27/2015  Navigator Location [facility] - -  Navigator Encounter Type Clinic/MDC - Other-Surgery   Telephone - Outgoing Call;Clinic/MDC Follow-up -  Abnormal Finding Date 02/09/2015 - -  Confirmed Diagnosis Date 02/22/2015 - -  Surgery Date - - 04/26/2015  Patient Visit Type - - -Mr. [name] states that his surgery went well. He is voiding post catheter removal but not as good as he would like. He will follow up with Dr. Louis Meckel 05/17/15. After he has healed from his surgery he will be referred back to Dr. Tammi Klippel for radiation. I asked them to call me with any questions or concerns and they voiced understanding.  Barriers/Navigation Needs No barriers at this time No barriers at this time No barriers at this time  Interventions - - -  Coordination of Care - - -  Support Groups/Services Prostate Support Group;Friends and Family Prostate Support Group;Friends and Family Friends and Family  Acuity - Level 1 Level 1  Acuity Level 1 - Minimal follow up required Initial guidance, education and coordination as needed  Time Spent with Patient 30 15 -

## 2015-04-27 NOTE — Care Management Note (Signed)
Case Management Note  Patient Details  Name: [name] MRN: [id number] Date of Birth: 1935/05/24  Subjective/Objective:79 y/o m admitted w/Acute urinary retention. From home.                    Action/Plan:d/c home no needs or orders.   Expected Discharge Date:                  Expected Discharge Plan:  Home/Self Care  In-House Referral:     Discharge planning Services  CM Consult  Post Acute Care Choice:    Choice offered to:     DME Arranged:    DME Agency:     HH Arranged:    [place] Agency:     Status of Service:  Completed, signed off  Medicare Important Message Given:    Date Medicare IM Given:    Medicare IM give by:    Date Additional Medicare IM Given:    Additional Medicare Important Message give by:     If discussed at [place] of Stay Meetings, dates discussed:    Additional Comments:  Dessa Phi, RN 04/27/2015, 1:25 PM

## 2015-04-27 NOTE — Progress Notes (Signed)
Patient urinated 293ml. Bladder scanned patient - post residual void was 150ml. MD stated patient could be discharged today as long as post residual void was less than 257ml.

## 2015-05-01 ENCOUNTER — Encounter (HOSPITAL_COMMUNITY): Payer: Self-pay | Admitting: Emergency Medicine

## 2015-05-01 ENCOUNTER — Emergency Department (HOSPITAL_COMMUNITY)
Admission: EM | Admit: 2015-05-01 | Discharge: 2015-05-01 | Disposition: A | Discharge status: Home / Self Care | Payer: Medicare Other | Attending: Emergency Medicine | Admitting: Emergency Medicine

## 2015-05-01 DIAGNOSIS — Z7984 Long term (current) use of oral hypoglycemic drugs: Secondary | ICD-10-CM | POA: Diagnosis not present

## 2015-05-01 DIAGNOSIS — E119 Type 2 diabetes mellitus without complications: Secondary | ICD-10-CM | POA: Diagnosis not present

## 2015-05-01 DIAGNOSIS — Z8669 Personal history of other diseases of the nervous system and sense organs: Secondary | ICD-10-CM | POA: Diagnosis not present

## 2015-05-01 DIAGNOSIS — R011 Cardiac murmur, unspecified: Secondary | ICD-10-CM | POA: Insufficient documentation

## 2015-05-01 DIAGNOSIS — I25119 Atherosclerotic heart disease of native coronary artery with unspecified angina pectoris: Secondary | ICD-10-CM | POA: Insufficient documentation

## 2015-05-01 DIAGNOSIS — Z8739 Personal history of other diseases of the musculoskeletal system and connective tissue: Secondary | ICD-10-CM | POA: Insufficient documentation

## 2015-05-01 DIAGNOSIS — R34 Anuria and oliguria: Secondary | ICD-10-CM | POA: Diagnosis not present

## 2015-05-01 DIAGNOSIS — Z7982 Long term (current) use of aspirin: Secondary | ICD-10-CM | POA: Diagnosis not present

## 2015-05-01 DIAGNOSIS — Z87891 Personal history of nicotine dependence: Secondary | ICD-10-CM | POA: Insufficient documentation

## 2015-05-01 DIAGNOSIS — R1030 Lower abdominal pain, unspecified: Secondary | ICD-10-CM | POA: Diagnosis not present

## 2015-05-01 DIAGNOSIS — Z87442 Personal history of urinary calculi: Secondary | ICD-10-CM | POA: Diagnosis not present

## 2015-05-01 DIAGNOSIS — Z8719 Personal history of other diseases of the digestive system: Secondary | ICD-10-CM | POA: Insufficient documentation

## 2015-05-01 DIAGNOSIS — Z9861 Coronary angioplasty status: Secondary | ICD-10-CM | POA: Diagnosis not present

## 2015-05-01 DIAGNOSIS — Z87448 Personal history of other diseases of urinary system: Secondary | ICD-10-CM | POA: Diagnosis not present

## 2015-05-01 DIAGNOSIS — Z79899 Other long term (current) drug therapy: Secondary | ICD-10-CM | POA: Diagnosis not present

## 2015-05-01 DIAGNOSIS — Z9889 Other specified postprocedural states: Secondary | ICD-10-CM | POA: Insufficient documentation

## 2015-05-01 DIAGNOSIS — I10 Essential (primary) hypertension: Secondary | ICD-10-CM | POA: Diagnosis not present

## 2015-05-01 DIAGNOSIS — Z8546 Personal history of malignant neoplasm of prostate: Secondary | ICD-10-CM | POA: Diagnosis not present

## 2015-05-01 DIAGNOSIS — Z794 Long term (current) use of insulin: Secondary | ICD-10-CM | POA: Diagnosis not present

## 2015-05-01 DIAGNOSIS — Z862 Personal history of diseases of the blood and blood-forming organs and certain disorders involving the immune mechanism: Secondary | ICD-10-CM | POA: Insufficient documentation

## 2015-05-01 DIAGNOSIS — Z792 Long term (current) use of antibiotics: Secondary | ICD-10-CM | POA: Diagnosis not present

## 2015-05-01 DIAGNOSIS — Z951 Presence of aortocoronary bypass graft: Secondary | ICD-10-CM | POA: Diagnosis not present

## 2015-05-01 DIAGNOSIS — Z8673 Personal history of transient ischemic attack (TIA), and cerebral infarction without residual deficits: Secondary | ICD-10-CM | POA: Insufficient documentation

## 2015-05-01 DIAGNOSIS — R339 Retention of urine, unspecified: Secondary | ICD-10-CM | POA: Diagnosis present

## 2015-05-01 DIAGNOSIS — E785 Hyperlipidemia, unspecified: Secondary | ICD-10-CM | POA: Insufficient documentation

## 2015-05-01 DIAGNOSIS — Z85828 Personal history of other malignant neoplasm of skin: Secondary | ICD-10-CM | POA: Diagnosis not present

## 2015-05-01 LAB — URINALYSIS, ROUTINE W REFLEX MICROSCOPIC
Bilirubin Urine: NEGATIVE
Glucose, UA: >1000 mg/dL — AB
KETONES UR: NEGATIVE mg/dL
LEUKOCYTES UA: NEGATIVE
Nitrite: NEGATIVE
PROTEIN: 100 mg/dL — AB
Specific Gravity, Urine: 1.023 (ref 1.005–1.030)
pH: 6.5 (ref 5.0–8.0)

## 2015-05-01 LAB — URINE MICROSCOPIC-ADD ON
BACTERIA UA: NONE SEEN
SQUAMOUS EPITHELIAL / LPF: NONE SEEN

## 2015-05-01 LAB — CBC WITH DIFFERENTIAL/PLATELET
BASOS ABS: 0 10*3/uL (ref 0.0–0.1)
BASOS PCT: 0 %
EOS PCT: 5 %
Eosinophils Absolute: 0.5 10*3/uL (ref 0.0–0.7)
HCT: 31 % — ABNORMAL LOW (ref 39.0–52.0)
Hemoglobin: 10.2 g/dL — ABNORMAL LOW (ref 13.0–17.0)
LYMPHS PCT: 17 %
Lymphs Abs: 1.6 10*3/uL (ref 0.7–4.0)
MCH: 27.3 pg (ref 26.0–34.0)
MCHC: 32.9 g/dL (ref 30.0–36.0)
MCV: 83.1 fL (ref 78.0–100.0)
Monocytes Absolute: 0.7 10*3/uL (ref 0.1–1.0)
Monocytes Relative: 8 %
Neutro Abs: 6.5 10*3/uL (ref 1.7–7.7)
Neutrophils Relative %: 70 %
PLATELETS: 305 10*3/uL (ref 150–400)
RBC: 3.73 MIL/uL — AB (ref 4.22–5.81)
RDW: 14.5 % (ref 11.5–15.5)
WBC: 9.3 10*3/uL (ref 4.0–10.5)

## 2015-05-01 LAB — BASIC METABOLIC PANEL
ANION GAP: 8 (ref 5–15)
BUN: 18 mg/dL (ref 6–20)
CHLORIDE: 101 mmol/L (ref 101–111)
CO2: 26 mmol/L (ref 22–32)
Calcium: 9.2 mg/dL (ref 8.9–10.3)
Creatinine, Ser: 1.04 mg/dL (ref 0.61–1.24)
GLUCOSE: 336 mg/dL — AB (ref 65–99)
POTASSIUM: 4.4 mmol/L (ref 3.5–5.1)
SODIUM: 135 mmol/L (ref 135–145)

## 2015-05-01 NOTE — ED Notes (Signed)
Pt complaint of urinary retention; last void 04/30/15 2100.

## 2015-05-01 NOTE — Discharge Instructions (Signed)
Acute Urinary Retention, Male °Acute urinary retention is the temporary inability to urinate. °This is a common problem in older men. As men age their prostates become larger and block the flow of urine from the bladder. This is usually a problem that has come on gradually.  °HOME CARE INSTRUCTIONS °If you are sent home with a Foley catheter and a drainage system, you will need to discuss the best course of action with your health care provider. While the catheter is in, maintain a good intake of fluids. Keep the drainage bag emptied and lower than your catheter. This is so that contaminated urine will not flow back into your bladder, which could lead to a urinary tract infection. °There are two main types of drainage bags. One is a large bag that usually is used at night. It has a good capacity that will allow you to sleep through the night without having to empty it. The second type is called a leg bag. It has a smaller capacity, so it needs to be emptied more frequently. However, the main advantage is that it can be attached by a leg strap and can go underneath your clothing, allowing you the freedom to move about or leave your home. °Only take over-the-counter or prescription medicines for pain, discomfort, or fever as directed by your health care provider.  °SEEK MEDICAL CARE IF: °· You develop a low-grade fever. °· You experience spasms or leakage of urine with the spasms. °SEEK IMMEDIATE MEDICAL CARE IF:  °· You develop chills or fever. °· Your catheter stops draining urine. °· Your catheter falls out. °· You start to develop increased bleeding that does not respond to rest and increased fluid intake. °MAKE SURE YOU: °· Understand these instructions. °· Will watch your condition. °· Will get help right away if you are not doing well or get worse. °  °This information is not intended to replace advice given to you by your health care provider. Make sure you discuss any questions you have with your health care  provider. °  °Document Released: 06/12/2000 Document Revised: 07/21/2014 Document Reviewed: 08/15/2012 °Elsevier Interactive Patient Education ©2016 Else[employer] Inc[employer] ° °

## 2015-05-01 NOTE — ED Notes (Signed)
Pt let go a voluminous amount of urine in the bed.  Then walked to the bathroom and gave a UA

## 2015-05-01 NOTE — ED Notes (Signed)
Awake. Verbally responsive. A/O x4. Resp even and unlabored. No audible adventitious breath sounds noted. ABC's intact.  

## 2015-05-01 NOTE — ED Notes (Signed)
Pt reported dysuria since last night with TURP being done on Monday. Pt reported that he has to "squeeze urine out". No burning/pressure with voiding, urinary frequency/urgency, hematuria, or lower abd/back pain.

## 2015-05-01 NOTE — ED Provider Notes (Signed)
CSN: [id number]     Arrival date & time 05/01/15  1359 History   First MD Initiated Contact with Patient 05/01/15 1500     Chief Complaint  Patient presents with  . Urinary Retention     (Consider location/radiation/quality/duration/timing/severity/associated sxs/prior Treatment) Patient is a 79 y.o. male presenting with male genitourinary complaint. The history is provided by the patient.  Male GU Problem Presenting symptoms comment:  Decreased UOP Context comment:  S/p TURP 5 days ago Relieved by:  Nothing Worsened by:  Nothing tried Ineffective treatments:  None tried Associated symptoms: groin pain and urinary retention   Associated symptoms: no abdominal pain and no fever   Risk factors comment:  Recent procedure   Past Medical History  Diagnosis Date  . Hyperlipidemia   . Degenerative disk disease   . Major depression ([place])   . Spinal stenosis   . Coronary artery disease     post stents; prior CABG; s/p cath January 2014 with 2 VD, patent SVG to [facility] and patent SVG to PL patent  . Hypertension   . Carotid artery occlusion   . Heart murmur   . Anginal pain ([place])   . History of esophageal stricture     "has it stretched q once in awhile" (03/26/2012"  . Type II diabetes mellitus ([place])   . Kidney stones   . Skin cancer of nose 2012    "right side" (03/26/2012)  . Skin cancer of trunk     "left chest" (03/26/2012)  . GERD (gastroesophageal reflux disease)   . Prostate cancer ([place])   . Stroke ([place]) 2014    NO RESIDUAL PROBLEMS  . Bladder outlet obstruction   . Foley catheter in place   . Anemia   . OSA (obstructive sleep apnea)     "have a mask; haven't used one in years" (03/26/2012)   Past Surgical History  Procedure Laterality Date  . Adenoidectomy  1956  . Lumbar disc surgery  1996  . Carotid endarterectomy  05/05/2003    right carotid endarterectomy  . Shoulder arthroscopy  01/07/2007    Left shoulder impingement, labral tear  . Cardiac catheterization   10/20/2003    Est. EF of 65% -- Critical disease in the mid to distal circumflex --  Preserved left ventricular  function --   . Coronary angioplasty with stent placement  10/20/2003    Percutaneous coronary intervention/drug-eluding stent implantation, third marginal branch --  Percutaneous closure, right femoral artery -- Atherosclerotic coronary vascular disease, single vessel / Status post successful percutaneous coronary intervention/tandem drug -- eluding stent implantation proximal third marginal branch -- Typical angina was not reproduced with device insertion or balloon   . Hematoma evacuation  05/09/2003    Evacuation of hematoma, right neck  . Coronary artery bypass graft  08/29/2010    CABG X2; SVG to PDA, SVG to [place].  . Aortic valve replacement  08/29/2010    25 mm CE Magna bioprosthetic - [facility]  . Mitral valve repair  08/23/2010    28 mm Simulus ring -[facility]  . Tonsillectomy and adenoidectomy  1956  . Appendectomy  1951  . Posterior fusion lumbar spine  1997  . Cervical disc surgery  1980's  . Anterior cervical decomp/discectomy fusion  2000's  . Skin cancer excision  1990's; 2012    "left chest & right nose" (03/26/2012)  . Cardiac valve replacement    . Tee without cardioversion N/A 05/15/2012    Procedure: TRANSESOPHAGEAL ECHOCARDIOGRAM (TEE);  Surgeon: Thayer Headings, MD;  Location: [place];  Service: Cardiovascular;  Laterality: N/A;  . Left heart catheterization with coronary angiogram N/A 03/27/2012    Procedure: LEFT HEART CATHETERIZATION WITH CORONARY ANGIOGRAM;  Surgeon: Peter M Martinique, MD;  Location: [facility] C[facility];  Service: Cardiovascular;  Laterality: N/A;  . Prostate biopsy    . Transurethral resection of prostate N/A 04/26/2015    Procedure: TRANSURETHRAL RESECTION OF THE PROSTATE WITH GYRUS INSTRUMENTS;  Surgeon: Ardis Hughs, MD;  Location: [facility];  Service: Urology;  Laterality: N/A;  TURP-BIPOLAR   Family History  Problem Relation Age of Onset  .  Diabetes Mother   . Coronary artery disease Mother   . Heart disease Mother   . Stroke Father   . Cancer Father     prostate  . Cancer Daughter     lymphoma hodgkins   . Seizures Daughter    Social History  Substance Use Topics  . Smoking status: Former Smoker -- 5.00 packs/day for 23 years    Types: Cigarettes    Quit date: 10/01/1978  . Smokeless tobacco: Never Used  . Alcohol Use: No     Comment: 03/26/2012 "last alcohol was in 1970; never had problem w/it"    Review of Systems  Constitutional: Negative for fever.  Gastrointestinal: Negative for abdominal pain.  All other systems reviewed and are negative.     Allergies  Review of patient's allergies indicates no known allergies.  Home Medications   Prior to Admission medications   Medication Sig Start Date End Date Taking? Authorizing Provider  amLODipine (NORVASC) 5 MG tablet Take 2.5 mg by mouth every evening.    Yes Historical Provider, MD  aspirin 325 MG EC tablet Take 325 mg by mouth at bedtime.   Yes Historical Provider, MD  ciprofloxacin (CIPRO) 500 MG tablet Take 1 tablet (500 mg total) by mouth 2 (two) times daily. 04/26/15  Yes Ardis Hughs, MD  docusate sodium (COLACE) 100 MG capsule Take 1 capsule (100 mg total) by mouth 2 (two) times daily as needed for mild constipation. 04/26/15  Yes Ardis Hughs, MD  FLUoxetine (PROZAC) 20 MG capsule Take 20 mg by mouth at bedtime.    Yes Historical Provider, MD  Insulin Glargine (BASAGLAR KWIKPEN) 100 UNIT/ML Solostar Pen Inject 25 Units into the skin every evening.    Yes Historical Provider, MD  Melatonin 3 MG TABS Take 1 tablet by mouth at bedtime.    Yes Historical Provider, MD  metFORMIN (GLUMETZA) 1000 MG (MOD) 24 hr tablet Take 1 tablet (1,000 mg total) by mouth 2 (two) times daily with a meal. 03/29/12  Yes Roger A Arguello, PA-C  metoprolol tartrate (LOPRESSOR) 25 MG tablet Take 12.5-25 mg by mouth 2 (two) times daily. 1/2 tablet in the morning, 1 tablet  in the evening   Yes Historical Provider, MD  oxyCODONE (OXY IR/ROXICODONE) 5 MG immediate release tablet Take 1-2 tablets (5-10 mg total) by mouth every 4 (four) hours as needed for severe pain. 04/26/15  Yes Ardis Hughs, MD  promethazine (PHENERGAN) 12.5 MG tablet Take 1 tablet (12.5 mg total) by mouth every 6 (six) hours as needed for nausea or vomiting. 04/27/15  Yes Ardis Hughs, MD  rosuvastatin (CRESTOR) 10 MG tablet Take 10 mg by mouth every morning.    Yes Historical Provider, MD  sitaGLIPtin (JANUVIA) 100 MG tablet Take 100 mg by mouth at bedtime.    Yes Historical Provider, MD  vitamin C (  ASCORBIC ACID) 500 MG tablet Take 500 mg by mouth 2 (two) times daily.     Yes Historical Provider, MD  nitroGLYCERIN (NITROSTAT) 0.4 MG SL tablet Place 1 tablet (0.4 mg total) under the tongue every 5 (five) minutes as needed. For chest pain 03/28/12   Thayer Headings, MD   BP 104/58 mmHg  Pulse 71  Temp(Src) 98 F (36.7 C) (Oral)  Resp 16  Ht 5\' 6"  (1.676 m)  Wt 160 lb (72.576 kg)  BMI 25.84 kg/m2  SpO2 97% Physical Exam  Constitutional: He is oriented to person, place, and time. He appears well-developed and well-nourished. No distress.  HENT:  Head: Normocephalic and atraumatic.  Eyes: Conjunctivae are normal.  Neck: Neck supple. No tracheal deviation present.  Cardiovascular: Normal rate and regular rhythm.   Pulmonary/Chest: Effort normal. No respiratory distress.  Abdominal: Soft. He exhibits no distension. There is tenderness (minimal suprapubic). There is no rebound and no guarding.  Neurological: He is alert and oriented to person, place, and time.  Skin: Skin is warm and dry.  Psychiatric: He has a normal mood and affect.    ED Course  Procedures (including critical care time) Labs Review Labs Reviewed  URINALYSIS, ROUTINE W REFLEX MICROSCOPIC (NOT AT [facility]) - Abnormal; Notable for the following:    Glucose, UA >1000 (*)    Hgb urine dipstick LARGE (*)    Protein, ur  100 (*)    All other components within normal limits  CBC WITH DIFFERENTIAL/PLATELET - Abnormal; Notable for the following:    RBC 3.73 (*)    Hemoglobin 10.2 (*)    HCT 31.0 (*)    All other components within normal limits  BASIC METABOLIC PANEL - Abnormal; Notable for the following:    Glucose, Bld 336 (*)    All other components within normal limits  URINE MICROSCOPIC-ADD ON    Imaging Review No results found. I have personally reviewed and evaluated these images and lab results as part of my medical decision-making.   EKG Interpretation None      MDM   Final diagnoses:  Decreased urination    80 y.o. male presents with urinary retention 5 days after having a TURP procedure performed. He had been having normal urine output prior to becoming anuric since last night. He has minimal tenderness over his suprapubic area and 330 cc of urine in the bladder without voiding in the emergency department. Plan placement of Foley catheter to relieve possible obstruction and patient urinated in the bed prior to placement. He then went to the bathroom and urinated spontaneously. No evidence of infection or acute kidney injury. Patient continues to void spontaneously. Return precautions and routine follow-up discussed.    Leo Grosser, MD 05/01/15 [phone]

## 2015-05-04 DIAGNOSIS — G4700 Insomnia, unspecified: Secondary | ICD-10-CM | POA: Diagnosis not present

## 2015-05-04 DIAGNOSIS — Z7984 Long term (current) use of oral hypoglycemic drugs: Secondary | ICD-10-CM | POA: Diagnosis not present

## 2015-05-04 DIAGNOSIS — E1122 Type 2 diabetes mellitus with diabetic chronic kidney disease: Secondary | ICD-10-CM | POA: Diagnosis not present

## 2015-05-17 DIAGNOSIS — R300 Dysuria: Secondary | ICD-10-CM | POA: Diagnosis not present

## 2015-05-17 DIAGNOSIS — Z0000 Encounter for general adult medical examination without abnormal findings: Secondary | ICD-10-CM | POA: Diagnosis not present

## 2015-05-17 DIAGNOSIS — C61 Malignant neoplasm of prostate: Secondary | ICD-10-CM | POA: Diagnosis not present

## 2015-05-19 ENCOUNTER — Encounter: Payer: Self-pay | Admitting: Medical Oncology

## 2015-05-19 ENCOUNTER — Telehealth: Payer: Self-pay | Admitting: Medical Oncology

## 2015-05-19 ENCOUNTER — Telehealth: Payer: Self-pay | Admitting: *Deleted

## 2015-05-19 NOTE — Progress Notes (Signed)
Oncology Nurse Navigator Documentation  Oncology Nurse Navigator Flowsheets 04/27/2015 05/18/2015 05/19/2015  Navigator Location - - -  Navigator Encounter Type Other Telephone Telephone-I spoke with Mr. Windmiller regarding the voice mail he left me 05/18/15. He had his Lupron injection 04/06/15 and he underwent a TURP 04/26/15. He saw Dr. Louis Meckel for his post op yesterday and was informed he can get his gold markers placed and start his radiation. He is not sure how to get these appointments scheduled. I explained that Dr. Louis Meckel will place the markers in his office.  Once the markers are placed he will be scheduled for CT sim and  radiation treatments here at [facility]. He states he was unclear of the process. He did not get an appointment for marker placement. I will call Dr. Carlton Adam office regarding appointment. I will call him back. He voiced understanding.   Telephone - - Outgoing Call  Abnormal Finding Date - - -  Confirmed Diagnosis Date - - -  Surgery Date 04/26/2015 - -  Patient Visit Type Inpatient - -  Treatment Phase Other - Pre-Tx/Tx Discussion  Barriers/Navigation Needs No barriers at this time Family concerns;Coordination of Care Family concerns;Coordination of Care  Interventions - - -  Coordination of Care - - -  Support Groups/Services Friends and Family - -  Acuity Level 2 - -  Acuity Level 1 - - -  Acuity Level 2 Ongoing guidance and education throughout treatment as needed - -  Time Spent with Patient [id number]

## 2015-05-19 NOTE — Progress Notes (Signed)
Oncology Nurse Navigator Documentation  Oncology Nurse Navigator Flowsheets 05/18/2015 05/19/2015 05/19/2015  Navigator Location - - -  Navigator Encounter Type Telephone Telephone Telephone- I called [facility] and spoke with Ashely- Dr. Carlton Adam nurse. I explained that scheduling gave me an appointment to have gold markers place 4/4 but pt wanted it sooner. She was able to reschedule for 3/16 at 1:30 pm. I called mr. Hagberg and emailed Dr. Tammi Klippel and Enid Derry to schedule CT sim. Pt is aware he will get a call regarding these appointments. He voiced understanding.  Telephone - Outgoing Call Outgoing Call;Appt Confirmation/Clarification  Abnormal Finding Date - - -  Confirmed Diagnosis Date - - -  Surgery Date - - -  Patient Visit Type - - -  Treatment Phase - Pre-Tx/Tx Discussion -  Barriers/Navigation Needs Family concerns;Coordination of Care Family concerns;Coordination of Care Coordination of Care  Interventions - - Coordination of Care  Coordination of Care - - -  Support Groups/Services - - -  Acuity - - -  Acuity Level 1 - - -  Acuity Level 2 - - -  Time Spent with Patient 15 30 30

## 2015-05-19 NOTE — Telephone Encounter (Signed)
Called patient to inform of sim on 06-18-15 @ 2 pm @ Dr. Johny Shears Office (his gold seeds will be placed on 06-03-15) @ Dr. Carlton Adam Office, spoke with patient and he is aware of this appt.

## 2015-05-19 NOTE — Telephone Encounter (Signed)
Oncology Nurse Navigator Documentation  Oncology Nurse Navigator Flowsheets 04/12/2015 04/27/2015 05/18/2015  Navigator Location - - -  Navigator Encounter Type - Other Telephone- [name] called and left a message stating that he saw  Dr. Louis Meckel today and was told her needed to be scheduled for the gold makes. He is not sure if we do those here at [facility] or at Dr. Shaune Leeks. He asked for a return call. I was not able to reach him today. I will call him the am.  Telephone Outgoing Call;Clinic/MDC Follow-up - -  Abnormal Finding Date - - -  Confirmed Diagnosis Date - - -  Surgery Date - 04/26/2015 -  Patient Visit Type - Inpatient -  Treatment Phase - Other -  Barriers/Navigation Needs No barriers at this time No barriers at this time Family concerns;Coordination of Care  Interventions - - -  Coordination of Care - - -  Support Groups/Services Prostate Support Group;Friends and Family Friends and Family -  Acuity Level 1 Level 2 -  Acuity Level 1 Minimal follow up required - -  Acuity Level 2 - Ongoing guidance and education throughout treatment as needed -  Time Spent with Patient 15 30 -

## 2015-05-31 ENCOUNTER — Encounter: Payer: Self-pay | Admitting: Family

## 2015-06-03 DIAGNOSIS — C61 Malignant neoplasm of prostate: Secondary | ICD-10-CM | POA: Diagnosis not present

## 2015-06-04 ENCOUNTER — Encounter: Payer: Self-pay | Admitting: Family

## 2015-06-04 ENCOUNTER — Ambulatory Visit (HOSPITAL_COMMUNITY)
Admission: RE | Admit: 2015-06-04 | Discharge: 2015-06-04 | Disposition: A | Discharge status: Home / Self Care | Payer: Medicare Other | Source: Ambulatory Visit | Attending: Family | Admitting: Family

## 2015-06-04 ENCOUNTER — Ambulatory Visit (INDEPENDENT_AMBULATORY_CARE_PROVIDER_SITE_OTHER): Payer: Medicare Other | Admitting: Family

## 2015-06-04 VITALS — BP 116/52 | HR 50 | Temp 97.4°F | Resp 14 | Ht 66.0 in | Wt 158.0 lb

## 2015-06-04 DIAGNOSIS — I6521 Occlusion and stenosis of right carotid artery: Secondary | ICD-10-CM

## 2015-06-04 DIAGNOSIS — Z9889 Other specified postprocedural states: Secondary | ICD-10-CM

## 2015-06-04 DIAGNOSIS — K219 Gastro-esophageal reflux disease without esophagitis: Secondary | ICD-10-CM | POA: Diagnosis not present

## 2015-06-04 DIAGNOSIS — E119 Type 2 diabetes mellitus without complications: Secondary | ICD-10-CM | POA: Insufficient documentation

## 2015-06-04 DIAGNOSIS — I6523 Occlusion and stenosis of bilateral carotid arteries: Secondary | ICD-10-CM | POA: Diagnosis not present

## 2015-06-04 DIAGNOSIS — E785 Hyperlipidemia, unspecified: Secondary | ICD-10-CM | POA: Diagnosis not present

## 2015-06-04 DIAGNOSIS — Z48812 Encounter for surgical aftercare following surgery on the circulatory system: Secondary | ICD-10-CM | POA: Diagnosis not present

## 2015-06-04 DIAGNOSIS — I10 Essential (primary) hypertension: Secondary | ICD-10-CM | POA: Diagnosis not present

## 2015-06-04 NOTE — Progress Notes (Signed)
Chief Complaint: Extracranial Carotid Artery Stenosis   History of Present Illness  [name] is a 80 y.o. male patient of Dr. Bridgett Larsson who is sp R CEA 2005 (Dr. Amedeo Plenty) who presents with chief complaint: prior stroke in 2014. Previous carotid studies demonstrated: RICA: widely patent CEA, LICA [id number] stenosis. Patient has known history of TIA or stroke symptom. The patient has never had amaurosis fugax or monocular blindness. The patient has never had facial drooping or hemiplegia. The patient has had expressive aphasia. He had a R posterior frontal CVA which left him with some word finding. Due his CVA episode, the patient has been placed on coumadin (pt also has a bioprosthetic aortic valve replacement) and aspirin.   02/24/14 telephone encounter documentation by Dr. Tomi Likens, pt's neurologist: Wilburn Mylar, I left a message for [name] to call me back to discuss my conversation with Dr. Sheila Oats. Yesterday, I discussed with his cardiologist, Dr. Sheila Oats, the findings of microhemorrhages on his MRI, suggesting possible amyloid angiopathy. Ideally, he should not be on any blood thinner, but since he has had a prior ischemic stroke, we decided to discontinue the anticoagulation but remain on ASA.   Pt's coumadin was stopped in December 2015 as indicated above, but he remains on ASA.   The patient reports New Medical or Surgical History: diagnosed with prostate cancer, will have radiation tx. He received what sounds like Lupron injections.  He had a CABG in June 2012.  Pt Diabetic: yes, states his last A1C was 7.6, states his blood sugars are higher due to prostate cancer treatment Pt smoker: former smoker, quit in 1980  Pt meds include: Statin : yes ASA: yes Other anticoagulants/antiplatelets: no   Past Medical History  Diagnosis Date  . Hyperlipidemia   . Degenerative disk disease   . Major depression ([place])   . Spinal stenosis   . Coronary artery disease     post stents;  prior CABG; s/p cath January 2014 with 2 VD, patent SVG to [facility] and patent SVG to PL patent  . Hypertension   . Carotid artery occlusion   . Heart murmur   . Anginal pain ([place])   . History of esophageal stricture     "has it stretched q once in awhile" (03/26/2012"  . Type II diabetes mellitus ([place])   . Kidney stones   . Skin cancer of nose 2012    "right side" (03/26/2012)  . Skin cancer of trunk     "left chest" (03/26/2012)  . GERD (gastroesophageal reflux disease)   . Prostate cancer ([place])   . Stroke ([place]) 2014    NO RESIDUAL PROBLEMS  . Bladder outlet obstruction   . Foley catheter in place   . Anemia   . OSA (obstructive sleep apnea)     "have a mask; haven't used one in years" (03/26/2012)    Social History Social History  Substance Use Topics  . Smoking status: Former Smoker -- 5.00 packs/day for 23 years    Types: Cigarettes    Quit date: 10/01/1978  . Smokeless tobacco: Never Used  . Alcohol Use: No     Comment: 03/26/2012 "last alcohol was in 1970; never had problem w/it"    Family History Family History  Problem Relation Age of Onset  . Diabetes Mother   . Coronary artery disease Mother   . Heart disease Mother   . Stroke Father   . Cancer Father     prostate  . Cancer Daughter  lymphoma hodgkins   . Seizures Daughter     Surgical History Past Surgical History  Procedure Laterality Date  . Adenoidectomy  1956  . Lumbar disc surgery  1996  . Carotid endarterectomy  05/05/2003    right carotid endarterectomy  . Shoulder arthroscopy  01/07/2007    Left shoulder impingement, labral tear  . Cardiac catheterization  10/20/2003    Est. EF of 65% -- Critical disease in the mid to distal circumflex --  Preserved left ventricular  function --   . Coronary angioplasty with stent placement  10/20/2003    Percutaneous coronary intervention/drug-eluding stent implantation, third marginal branch --  Percutaneous closure, right femoral artery -- Atherosclerotic  coronary vascular disease, single vessel / Status post successful percutaneous coronary intervention/tandem drug -- eluding stent implantation proximal third marginal branch -- Typical angina was not reproduced with device insertion or balloon   . Hematoma evacuation  05/09/2003    Evacuation of hematoma, right neck  . Coronary artery bypass graft  08/29/2010    CABG X2; SVG to PDA, SVG to [place].  . Aortic valve replacement  08/29/2010    25 mm CE Magna bioprosthetic - [facility]  . Mitral valve repair  08/23/2010    28 mm Simulus ring -[facility]  . Tonsillectomy and adenoidectomy  1956  . Appendectomy  1951  . Posterior fusion lumbar spine  1997  . Cervical disc surgery  1980's  . Anterior cervical decomp/discectomy fusion  2000's  . Skin cancer excision  1990's; 2012    "left chest & right nose" (03/26/2012)  . Cardiac valve replacement    . Tee without cardioversion N/A 05/15/2012    Procedure: TRANSESOPHAGEAL ECHOCARDIOGRAM (TEE);  Surgeon: Thayer Headings, MD;  Location: [place];  Service: Cardiovascular;  Laterality: N/A;  . Left heart catheterization with coronary angiogram N/A 03/27/2012    Procedure: LEFT HEART CATHETERIZATION WITH CORONARY ANGIOGRAM;  Surgeon: Peter M Martinique, MD;  Location: [facility] CATH LAB;  Service: Cardiovascular;  Laterality: N/A;  . Prostate biopsy    . Transurethral resection of prostate N/A 04/26/2015    Procedure: TRANSURETHRAL RESECTION OF THE PROSTATE WITH GYRUS INSTRUMENTS;  Surgeon: Ardis Hughs, MD;  Location: [facility];  Service: Urology;  Laterality: N/A;  TURP-BIPOLAR    No Known Allergies  Current Outpatient Prescriptions  Medication Sig Dispense Refill  . amLODipine (NORVASC) 5 MG tablet Take 2.5 mg by mouth every evening.     [name] aspirin 325 MG EC tablet Take 325 mg by mouth at bedtime.    [name] FLUoxetine (PROZAC) 20 MG capsule Take 20 mg by mouth at bedtime.     . Insulin Glargine (BASAGLAR KWIKPEN) 100 UNIT/ML Solostar Pen Inject 25 Units into the  skin every evening.     . Melatonin 3 MG TABS Take 1 tablet by mouth at bedtime.     . metFORMIN (GLUMETZA) 1000 MG (MOD) 24 hr tablet Take 1 tablet (1,000 mg total) by mouth 2 (two) times daily with a meal.    . metoprolol tartrate (LOPRESSOR) 25 MG tablet Take 12.5-25 mg by mouth 2 (two) times daily. 1/2 tablet in the morning, 1 tablet in the evening    . nitroGLYCERIN (NITROSTAT) 0.4 MG SL tablet Place 1 tablet (0.4 mg total) under the tongue every 5 (five) minutes as needed. For chest pain 25 tablet 5  . oxyCODONE (OXY IR/ROXICODONE) 5 MG immediate release tablet Take 1-2 tablets (5-10 mg total) by mouth every 4 (four) hours as  needed for severe pain. 30 tablet 0  . rosuvastatin (CRESTOR) 10 MG tablet Take 10 mg by mouth every morning.     . vitamin C (ASCORBIC ACID) 500 MG tablet Take 500 mg by mouth 2 (two) times daily.      [name] docusate sodium (COLACE) 100 MG capsule Take 1 capsule (100 mg total) by mouth 2 (two) times daily as needed for mild constipation. (Patient not taking: Reported on 06/04/2015) 60 capsule 0  . promethazine (PHENERGAN) 12.5 MG tablet Take 1 tablet (12.5 mg total) by mouth every 6 (six) hours as needed for nausea or vomiting. (Patient not taking: Reported on 06/04/2015) 15 tablet 0   No current facility-administered medications for this visit.    Review of Systems : See HPI for pertinent positives and negatives.  Physical Examination  Filed Vitals:   06/04/15 1336  BP: 118/58  Pulse: 68  Temp: 97.4 F (36.3 C)  TempSrc: Oral  Resp: 14  Height: 5\' 6"  (1.676 m)  Weight: 158 lb (71.668 kg)  SpO2: 100%   Body mass index is 25.51 kg/(m^2).  General: A&O x 3, WDWN  Head: [place]/AT  Eyes: PERRLA  Pulmonary: Sym exp, good air movt, CTAB, no rales, rhonchi, or wheezing  Cardiac: RRR, Nl S1, S2, no detected murmur  Vascular: Vessel Right Left  Radial Palpable Palpable  Brachial Palpable Palpable  Carotid Palpable, with bruit Palpable, without bruit   Aorta Not palpable N/A  Popliteal Not palpable Not palpable  PT Palpable Palpable  DP Palpable Palpable   Gastrointestinal: soft, NTND, -G/R, - HSM, - palpable masses, - CVAT B  Musculoskeletal: M/S 5/5 throughout, Extremities without ischemic changes   Neurologic: CN 2-12 intact, Pain and light touch intact in extremities , Motor exam as listed above  Psychiatric: Judgment intact, Mood & affect appropriate for pt's clinical situation  Dermatologic: See M/S exam for extremity exam, no rashes otherwise noted            Non-Invasive Vascular Imaging CAROTID DUPLEX 06/04/2015   Right ICA: CEA site with 1-39% stenosis. Left ICA: 1 - 39 % stenosis. No significant stenosis of the bilateral ECA or CCA. Vessel tortuosity in the bilateral ICA.  Bilateral vertebral artery is antegrade. No significant change compared to exam of 05/30/13.    Assessment: [name] is a 80 y.o. male who is sp R CEA in 2005. The pt had a R posterior frontal CVA in 2014 which left him with some word finding. His coumadin was stopped by his neurologist in consultation with pt's cardiologist (pt also has a bioprosthetic aortic valve replacement) due to the findings of microhemorrhages on MRI of his brain, suggesting possible amyloid angiopathy. Ideally, pt should not be on any blood thinner, but since he has had a prior ischemic stroke, it was decided to discontinue the anticoagulation but remain on ASA.  Today's carotid duplex suggests right ICA: CEA site with 1-39% stenosis. Left ICA: 1 - 39 % stenosis. No significant stenosis of the bilateral ECA or CCA. Vessel tortuosity in the bilateral ICA.  Bilateral vertebral artery is antegrade. No significant change compared to exam of 05/30/13.     Plan: Follow-up in 2 years with Carotid Duplex scan.   I discussed in depth with the patient the nature of atherosclerosis, and emphasized the importance of maximal medical management including  strict control of blood pressure, blood glucose, and lipid levels, obtaining regular exercise, and continued cessation of smoking.  The patient is aware that without  maximal medical management the underlying atherosclerotic disease process will progress, limiting the benefit of any interventions. The patient was given information about stroke prevention and what symptoms should prompt the patient to seek immediate medical care. Thank you for allowing [place] to participate in this patient's care.  Clemon Chambers, RN, MSN, FNP-C [facility] [place] Office: [phone]  Clinic Physician: Bridgett Larsson  06/04/2015 1:41 PM

## 2015-06-04 NOTE — Patient Instructions (Signed)
Stroke Prevention Some medical conditions and behaviors are associated with an increased chance of having a stroke. You may prevent a stroke by making healthy choices and managing medical conditions. HOW CAN I REDUCE MY RISK OF HAVING A STROKE?   Stay physically active. Get at least 30 minutes of activity on most or all days.  Do not smoke. It may also be helpful to avoid exposure to secondhand smoke.  Limit alcohol use. Moderate alcohol use is considered to be:  No more than 2 drinks per day for men.  No more than 1 drink per day for nonpregnant women.  Eat healthy foods. This involves:  Eating 5 or more servings of fruits and vegetables a day.  Making dietary changes that address high blood pressure (hypertension), high cholesterol, diabetes, or obesity.  Manage your cholesterol levels.  Making food choices that are high in fiber and low in saturated fat, trans fat, and cholesterol may control cholesterol levels.  Take any prescribed medicines to control cholesterol as directed by your health care provider.  Manage your diabetes.  Controlling your carbohydrate and sugar intake is recommended to manage diabetes.  Take any prescribed medicines to control diabetes as directed by your health care provider.  Control your hypertension.  Making food choices that are low in salt (sodium), saturated fat, trans fat, and cholesterol is recommended to manage hypertension.  Ask your health care provider if you need treatment to lower your blood pressure. Take any prescribed medicines to control hypertension as directed by your health care provider.  If you are 18-39 years of age, have your blood pressure checked every 3-5 years. If you are 40 years of age or older, have your blood pressure checked every year.  Maintain a healthy weight.  Reducing calorie intake and making food choices that are low in sodium, saturated fat, trans fat, and cholesterol are recommended to manage  weight.  Stop drug abuse.  Avoid taking birth control pills.  Talk to your health care provider about the risks of taking birth control pills if you are over 35 years old, smoke, get migraines, or have ever had a blood clot.  Get evaluated for sleep disorders (sleep apnea).  Talk to your health care provider about getting a sleep evaluation if you snore a lot or have excessive sleepiness.  Take medicines only as directed by your health care provider.  For some people, aspirin or blood thinners (anticoagulants) are helpful in reducing the risk of forming abnormal blood clots that can lead to stroke. If you have the irregular heart rhythm of atrial fibrillation, you should be on a blood thinner unless there is a good reason you cannot take them.  Understand all your medicine instructions.  Make sure that other conditions (such as anemia or atherosclerosis) are addressed. SEEK IMMEDIATE MEDICAL CARE IF:   You have sudden weakness or numbness of the face, arm, or leg, especially on one side of the body.  Your face or eyelid droops to one side.  You have sudden confusion.  You have trouble speaking (aphasia) or understanding.  You have sudden trouble seeing in one or both eyes.  You have sudden trouble walking.  You have dizziness.  You have a loss of balance or coordination.  You have a sudden, severe headache with no known cause.  You have new chest pain or an irregular heartbeat. Any of these symptoms may represent a serious problem that is an emergency. Do not wait to see if the symptoms will   go away. Get medical help at once. Call your local emergency services (911 in [place] Do not drive yourself to the hospital.   This information is not intended to replace advice given to you by your health care provider. Make sure you discuss any questions you have with your health care provider.   Document Released: 04/13/2004 Document Revised: 03/27/2014 Document Reviewed:  09/06/2012 Elsevier Interactive Patient Education 2016 Elsevier Inc.  

## 2015-06-08 NOTE — Addendum Note (Signed)
Addended by: Mena Goes on: 06/08/2015 12:34 PM   Modules accepted: Orders

## 2015-06-10 ENCOUNTER — Institutional Professional Consult (permissible substitution): Payer: Self-pay | Admitting: Radiation Oncology

## 2015-06-17 ENCOUNTER — Ambulatory Visit
Admission: RE | Admit: 2015-06-17 | Discharge: 2015-06-17 | Disposition: A | Discharge status: Home / Self Care | Payer: Medicare Other | Source: Ambulatory Visit | Attending: Radiation Oncology | Admitting: Radiation Oncology

## 2015-06-17 VITALS — BP 126/61 | HR 61 | Temp 97.7°F | Resp 16 | Ht 66.0 in | Wt 161.6 lb

## 2015-06-17 DIAGNOSIS — C61 Malignant neoplasm of prostate: Secondary | ICD-10-CM | POA: Diagnosis not present

## 2015-06-17 DIAGNOSIS — Z510 Encounter for antineoplastic radiation therapy: Secondary | ICD-10-CM | POA: Diagnosis not present

## 2015-06-17 NOTE — Progress Notes (Signed)
Weight and vitals stable. Denies pain. Denies history of radiation therapy. Denies having a pacemaker. Gold markers placed on 06/04/15. Denies hematuria or dysuria. Reports nocturia x 1-2. Describes his urine stream a strong. Goes onto to say his urine come out at a slant. Denies difficulty emptying his bladder. Denies urinary frequency. Reports on rare occasion he has urinary leakage. Denies urgency.  Denies diarrhea.   BP 126/61 mmHg  Pulse 61  Temp(Src) 97.7 F (36.5 C) (Oral)  Resp 16  Ht 5\' 6"  (1.676 m)  Wt 161 lb 9.6 oz (73.301 kg)  BMI 26.10 kg/m2  SpO2 100% Wt Readings from Last 3 Encounters:  06/17/15 161 lb 9.6 oz (73.301 kg)  06/04/15 158 lb (71.668 kg)  05/01/15 160 lb (72.576 kg)

## 2015-06-17 NOTE — Progress Notes (Signed)
See progress note under physician encounter. 

## 2015-06-17 NOTE — Progress Notes (Signed)
Radiation Oncology         ([phone]) [phone] ________________________________  Name: LEIBISH PAYNTER MRN: [id number]  Date: 06/17/2015  DOB: February 23, 1936  Follow-Up Visit Note  CC: [name], MD  Lavone Orn, MD  Diagnosis:   80 y.o. gentleman with stage T2 adenocarcinoma of the prostate with a Gleason's score of 4+5 and a PSA of 2.21 (on Finasteride).    ICD-9-CM ICD-10-CM   1. Prostate cancer ([place]) [place]     Narrative:  The patient returns today for routine follow-up. Weight and vitals stable. Denies pain. Denies history of radiation therapy. Denies having a pacemaker. Gold markers placed on 06/04/15. He has started hormone therapy. Denies hematuria or dysuria. Reports nocturia x 1-2. Describes his urine stream as strong. Goes onto to say his urine comes out at a slant. Denies difficulty emptying his bladder. Denies urinary frequency. Reports on rare occasion he has urinary leakage. Denies urgency. Denies diarrhea. He had a TURP 04/26/2015 and he mentions that he is urinating better.  ALLERGIES:  has No Known Allergies.  Meds: Current Outpatient Prescriptions  Medication Sig Dispense Refill  . amLODipine (NORVASC) 5 MG tablet Take 2.5 mg by mouth every evening.     [name] aspirin 325 MG EC tablet Take 325 mg by mouth at bedtime.    [name] FLUoxetine (PROZAC) 20 MG capsule Take 20 mg by mouth at bedtime.     . Insulin Glargine (BASAGLAR KWIKPEN) 100 UNIT/ML Solostar Pen Inject 25 Units into the skin every evening.     . Melatonin 3 MG TABS Take 1 tablet by mouth at bedtime.     . metFORMIN (GLUMETZA) 1000 MG (MOD) 24 hr tablet Take 1 tablet (1,000 mg total) by mouth 2 (two) times daily with a meal.    . metoprolol tartrate (LOPRESSOR) 25 MG tablet Take 12.5-25 mg by mouth 2 (two) times daily. 1/2 tablet in the morning, 1 tablet in the evening    . Multiple Vitamin (MULTIVITAMIN) tablet Take 1 tablet by mouth daily.    . nitroGLYCERIN (NITROSTAT) 0.4 MG SL tablet Place 1 tablet (0.4 mg  total) under the tongue every 5 (five) minutes as needed. For chest pain 25 tablet 5  . rosuvastatin (CRESTOR) 10 MG tablet Take 10 mg by mouth every morning.     . vitamin C (ASCORBIC ACID) 500 MG tablet Take 500 mg by mouth 2 (two) times daily.       No current facility-administered medications for this encounter.    Physical Findings: The patient is in no acute distress. Patient is alert and oriented.  vitals were not taken for this visit..  No significant changes.  BP 126/61 mmHg  Pulse 61  Temp(Src) 97.7 F (36.5 C) (Oral)  Resp 16  Ht 5\' 6"  (1.676 m)  Wt 161 lb 9.6 oz (73.301 kg)  BMI 26.10 kg/m2  SpO2 100%  Lab Findings: Lab Results  Component Value Date   WBC 9.3 05/01/2015   HGB 10.2* 05/01/2015   HCT 31.0* 05/01/2015   PLT 305 05/01/2015    Lab Results  Component Value Date   NA 135 05/01/2015   K 4.4 05/01/2015   CO2 26 05/01/2015   GLUCOSE 336* 05/01/2015   BUN 18 05/01/2015   CREATININE 1.04 05/01/2015   BILITOT 0.5 05/09/2012   ALKPHOS 65 05/09/2012   AST 17 05/09/2012   ALT 14 05/09/2012   PROT 7.0 05/09/2012   ALBUMIN 3.7 05/09/2012   CALCIUM 9.2 05/01/2015   ANIONGAP  8 05/01/2015    Radiographic Findings: No results found.  Impression:  Mr. Brocato is a 80 yo male with stage T2 adenocarcinoma of the prostate with a Gleason's score of 4+5 and a PSA of 2.21. He is a good candidate for IMRT.  He has started ADT which should continue for 2 years.  He has also had TURP.  Plan:  He has his planning appointment scheduled tomorrow at 2:30 pm. We discussed the role of radiation and its possible side effects. We discussed long term side effects including erectile dysfunction and rectal bleeding. We discussed that he would need 8 weeks of treatments. This procedure has been fully reviewed with the patient and written informed consent has been obtained.  _____________________________________  Sheral Apley. Tammi Klippel, M.D.    This document serves as a  record of services personally performed by Tyler Pita, MD. It was created on his behalf by Lendon Collar, a trained medical scribe. The creation of this record is based on the scribe's personal observations and the provider's statements to them. This document has been checked and approved by the attending provider.

## 2015-06-18 ENCOUNTER — Encounter: Payer: Self-pay | Admitting: Medical Oncology

## 2015-06-18 ENCOUNTER — Ambulatory Visit
Admission: RE | Admit: 2015-06-18 | Discharge: 2015-06-18 | Disposition: A | Discharge status: Home / Self Care | Payer: Medicare Other | Source: Ambulatory Visit | Attending: Radiation Oncology | Admitting: Radiation Oncology

## 2015-06-18 DIAGNOSIS — C61 Malignant neoplasm of prostate: Secondary | ICD-10-CM | POA: Diagnosis not present

## 2015-06-18 DIAGNOSIS — Z510 Encounter for antineoplastic radiation therapy: Secondary | ICD-10-CM | POA: Diagnosis not present

## 2015-06-18 NOTE — Progress Notes (Signed)
  Radiation Oncology         [phone] ________________________________  Name: [name] MRN: [id number]  Date: 06/18/2015  DOB: 10-Jan-1936  SIMULATION AND TREATMENT PLANNING NOTE    ICD-9-CM ICD-10-CM   1. Prostate cancer ([place]) 185 C61     DIAGNOSIS:  80 y.o. gentleman with stage T2 adenocarcinoma of the prostate with a Gleason's score of 4+5 and a PSA of 2.21 (on Finasteride)  NARRATIVE:  The patient was brought to the [facility].  Identity was confirmed.  All relevant records and images related to the planned course of therapy were reviewed.  The patient freely provided informed written consent to proceed with treatment after reviewing the details related to the planned course of therapy. The consent form was witnessed and verified by the simulation staff.  Then, the patient was set-up in a stable reproducible supine position for radiation therapy.  A vacuum lock pillow device was custom fabricated to position his legs in a reproducible immobilized position.  Then, I performed a urethrogram under sterile conditions to identify the prostatic apex.  CT images were obtained.  Surface markings were placed.  The CT images were loaded into the planning software.  Then the prostate target and avoidance structures including the rectum, bladder, bowel and hips were contoured.  Treatment planning then occurred.  The radiation prescription was entered and confirmed.  A total of 1 complex treatment devices were fabricated. I have requested : Intensity Modulated Radiotherapy (IMRT) is medically necessary for this case for the following reason:  Rectal sparing.  PLAN:  The patient will receive 75 Gy in 40 fractions.  ________________________________  Sheral Apley Tammi Klippel, M.D.  This document serves as a record of services personally performed by Tyler Pita, MD. It was created on his behalf by Arlyce Harman, a trained medical scribe. The creation of this record is based on  the scribe's personal observations and the provider's statements to them. This document has been checked and approved by the attending provider.

## 2015-06-28 DIAGNOSIS — I371 Nonrheumatic pulmonary valve insufficiency: Secondary | ICD-10-CM | POA: Diagnosis not present

## 2015-06-28 DIAGNOSIS — C61 Malignant neoplasm of prostate: Secondary | ICD-10-CM | POA: Diagnosis not present

## 2015-06-28 DIAGNOSIS — Z510 Encounter for antineoplastic radiation therapy: Secondary | ICD-10-CM | POA: Diagnosis not present

## 2015-06-28 DIAGNOSIS — Z9889 Other specified postprocedural states: Secondary | ICD-10-CM | POA: Diagnosis not present

## 2015-06-28 DIAGNOSIS — I2510 Atherosclerotic heart disease of native coronary artery without angina pectoris: Secondary | ICD-10-CM | POA: Diagnosis not present

## 2015-06-28 DIAGNOSIS — I10 Essential (primary) hypertension: Secondary | ICD-10-CM | POA: Diagnosis not present

## 2015-06-28 DIAGNOSIS — Z952 Presence of prosthetic heart valve: Secondary | ICD-10-CM | POA: Diagnosis not present

## 2015-06-28 DIAGNOSIS — I2584 Coronary atherosclerosis due to calcified coronary lesion: Secondary | ICD-10-CM | POA: Diagnosis not present

## 2015-06-28 DIAGNOSIS — Z951 Presence of aortocoronary bypass graft: Secondary | ICD-10-CM | POA: Diagnosis not present

## 2015-06-28 DIAGNOSIS — I6340 Cerebral infarction due to embolism of unspecified cerebral artery: Secondary | ICD-10-CM | POA: Diagnosis not present

## 2015-06-29 ENCOUNTER — Ambulatory Visit
Admission: RE | Admit: 2015-06-29 | Discharge: 2015-06-29 | Disposition: A | Discharge status: Home / Self Care | Payer: Medicare Other | Source: Ambulatory Visit | Attending: Radiation Oncology | Admitting: Radiation Oncology

## 2015-06-29 ENCOUNTER — Ambulatory Visit: Payer: Medicare Other | Admitting: Radiation Oncology

## 2015-06-29 DIAGNOSIS — C61 Malignant neoplasm of prostate: Secondary | ICD-10-CM | POA: Diagnosis not present

## 2015-06-29 DIAGNOSIS — Z510 Encounter for antineoplastic radiation therapy: Secondary | ICD-10-CM | POA: Diagnosis not present

## 2015-06-30 ENCOUNTER — Encounter: Payer: Self-pay | Admitting: Medical Oncology

## 2015-06-30 ENCOUNTER — Ambulatory Visit: Payer: Medicare Other

## 2015-06-30 ENCOUNTER — Ambulatory Visit
Admission: RE | Admit: 2015-06-30 | Discharge: 2015-06-30 | Disposition: A | Discharge status: Home / Self Care | Payer: Medicare Other | Source: Ambulatory Visit | Attending: Radiation Oncology | Admitting: Radiation Oncology

## 2015-06-30 DIAGNOSIS — C61 Malignant neoplasm of prostate: Secondary | ICD-10-CM | POA: Diagnosis not present

## 2015-06-30 DIAGNOSIS — Z510 Encounter for antineoplastic radiation therapy: Secondary | ICD-10-CM | POA: Diagnosis not present

## 2015-06-30 NOTE — Progress Notes (Signed)
Oncology Nurse Navigator Documentation  Oncology Nurse Navigator Flowsheets 05/19/2015 06/18/2015 06/30/2015  Navigator Location - - -  Navigator Encounter Type Telephone - Treatment  Telephone Outgoing Call;Appt Confirmation/Clarification - -  Abnormal Finding Date - - -  Confirmed Diagnosis Date - - -  Surgery Date - - -  Patient Visit Type - - -  Treatment Phase - CT SIM First Chemo Tx- [name] started his chemo yesterday 06/29/15. He states it went well. He is excited to finally start radiation. No issues today. I will continue to follow.  Barriers/Navigation Needs Coordination of Care - No barriers at this time  Interventions Coordination of Care - None required  Coordination of Care - - -  Support Groups/Services - - Friends and Family  Acuity - - -  Acuity Level 1 - - -  Acuity Level 2 - - -  Time Spent with Patient 30 - 15

## 2015-07-01 ENCOUNTER — Ambulatory Visit
Admission: RE | Admit: 2015-07-01 | Discharge: 2015-07-01 | Disposition: A | Discharge status: Home / Self Care | Payer: Medicare Other | Source: Ambulatory Visit | Attending: Radiation Oncology | Admitting: Radiation Oncology

## 2015-07-01 ENCOUNTER — Ambulatory Visit: Payer: Medicare Other

## 2015-07-01 DIAGNOSIS — C61 Malignant neoplasm of prostate: Secondary | ICD-10-CM | POA: Diagnosis not present

## 2015-07-01 DIAGNOSIS — Z510 Encounter for antineoplastic radiation therapy: Secondary | ICD-10-CM | POA: Diagnosis not present

## 2015-07-02 ENCOUNTER — Ambulatory Visit: Payer: Medicare Other

## 2015-07-02 ENCOUNTER — Ambulatory Visit
Admission: RE | Admit: 2015-07-02 | Discharge: 2015-07-02 | Disposition: A | Discharge status: Home / Self Care | Payer: Medicare Other | Source: Ambulatory Visit | Attending: Radiation Oncology | Admitting: Radiation Oncology

## 2015-07-02 VITALS — BP 138/52 | HR 63 | Resp 16 | Wt 160.4 lb

## 2015-07-02 DIAGNOSIS — C61 Malignant neoplasm of prostate: Secondary | ICD-10-CM

## 2015-07-02 DIAGNOSIS — Z510 Encounter for antineoplastic radiation therapy: Secondary | ICD-10-CM | POA: Diagnosis not present

## 2015-07-02 NOTE — Progress Notes (Signed)
  Radiation Oncology         [phone]   Name: [name] MRN: [id number]   Date: 07/02/2015  DOB: Aug 26, 1935   Weekly Radiation Therapy Management    ICD-9-CM ICD-10-CM   1. Prostate cancer ([place]) 185 C61     Current Dose: 7.2 Gy  Planned Dose:  75 Gy  Narrative The patient presents for routine under treatment assessment.  The patient denies pain, dysuria, hematuria, leakage, incontinence, difficulty voiding, urinary frequency, urgency, or diarrhea. Reports nocturia x 3.  Set-up films were reviewed. The chart was checked.  Physical Findings  weight is 160 lb 6.4 oz (72.757 kg). His blood pressure is 138/52 and his pulse is 63. His respiration is 16 and oxygen saturation is 100%. . Weight essentially stable.  No significant changes.  Impression The patient is tolerating radiation.  Plan Continue treatment as planned.     Sheral Apley Tammi Klippel, M.D.  This document serves as a record of services personally performed by Tyler Pita, MD. It was created on his behalf by Darcus Austin, a trained medical scribe. The creation of this record is based on the scribe's personal observations and the provider's statements to them. This document has been checked and approved by the attending provider.

## 2015-07-02 NOTE — Progress Notes (Addendum)
Weight and vitals stable. Denies pain. Reports nocturia x 3. Denies dysuria or hematuria. Denies leakage or incontinence. Denies difficulty emptying. Denies frequency or urgency. Denies diarrhea. Oriented patient to staff and routine of the clinic. Educated patient reference potential side effects and management such as fatigue, diarrhea and urinary/bladder changes. Provided patient with my business card and encouraged him to call with needs. Patient verbalized understanding of all reviewed.   BP 138/52 mmHg  Pulse 63  Resp 16  Wt 160 lb 6.4 oz (72.757 kg)  SpO2 100% Wt Readings from Last 3 Encounters:  07/02/15 160 lb 6.4 oz (72.757 kg)  06/17/15 161 lb 9.6 oz (73.301 kg)  06/04/15 158 lb (71.668 kg)

## 2015-07-05 ENCOUNTER — Ambulatory Visit: Payer: Medicare Other

## 2015-07-05 ENCOUNTER — Ambulatory Visit
Admission: RE | Admit: 2015-07-05 | Discharge: 2015-07-05 | Disposition: A | Discharge status: Home / Self Care | Payer: Medicare Other | Source: Ambulatory Visit | Attending: Radiation Oncology | Admitting: Radiation Oncology

## 2015-07-05 DIAGNOSIS — Z510 Encounter for antineoplastic radiation therapy: Secondary | ICD-10-CM | POA: Diagnosis not present

## 2015-07-05 DIAGNOSIS — C61 Malignant neoplasm of prostate: Secondary | ICD-10-CM | POA: Diagnosis not present

## 2015-07-06 ENCOUNTER — Ambulatory Visit: Payer: Medicare Other

## 2015-07-06 ENCOUNTER — Ambulatory Visit
Admission: RE | Admit: 2015-07-06 | Discharge: 2015-07-06 | Disposition: A | Discharge status: Home / Self Care | Payer: Medicare Other | Source: Ambulatory Visit | Attending: Radiation Oncology | Admitting: Radiation Oncology

## 2015-07-06 DIAGNOSIS — C61 Malignant neoplasm of prostate: Secondary | ICD-10-CM | POA: Diagnosis not present

## 2015-07-06 DIAGNOSIS — Z510 Encounter for antineoplastic radiation therapy: Secondary | ICD-10-CM | POA: Diagnosis not present

## 2015-07-06 NOTE — Addendum Note (Signed)
Encounter addended by: Heywood Footman, RN on: 07/06/2015 10:33 AM<BR>     Documentation filed: Notes Section, Chief Complaint Section

## 2015-07-07 ENCOUNTER — Ambulatory Visit
Admission: RE | Admit: 2015-07-07 | Discharge: 2015-07-07 | Disposition: A | Discharge status: Home / Self Care | Payer: Medicare Other | Source: Ambulatory Visit | Attending: Radiation Oncology | Admitting: Radiation Oncology

## 2015-07-07 ENCOUNTER — Encounter: Payer: Self-pay | Admitting: Medical Oncology

## 2015-07-07 ENCOUNTER — Ambulatory Visit: Payer: Medicare Other

## 2015-07-07 DIAGNOSIS — C61 Malignant neoplasm of prostate: Secondary | ICD-10-CM | POA: Diagnosis not present

## 2015-07-07 DIAGNOSIS — Z510 Encounter for antineoplastic radiation therapy: Secondary | ICD-10-CM | POA: Diagnosis not present

## 2015-07-08 ENCOUNTER — Ambulatory Visit
Admission: RE | Admit: 2015-07-08 | Discharge: 2015-07-08 | Disposition: A | Discharge status: Home / Self Care | Payer: Medicare Other | Source: Ambulatory Visit | Attending: Radiation Oncology | Admitting: Radiation Oncology

## 2015-07-08 ENCOUNTER — Ambulatory Visit: Payer: Medicare Other

## 2015-07-08 DIAGNOSIS — C61 Malignant neoplasm of prostate: Secondary | ICD-10-CM | POA: Diagnosis not present

## 2015-07-08 DIAGNOSIS — Z510 Encounter for antineoplastic radiation therapy: Secondary | ICD-10-CM | POA: Diagnosis not present

## 2015-07-09 ENCOUNTER — Ambulatory Visit
Admission: RE | Admit: 2015-07-09 | Discharge: 2015-07-09 | Disposition: A | Discharge status: Home / Self Care | Payer: Medicare Other | Source: Ambulatory Visit | Attending: Radiation Oncology | Admitting: Radiation Oncology

## 2015-07-09 ENCOUNTER — Ambulatory Visit: Payer: Medicare Other

## 2015-07-09 VITALS — BP 157/55 | HR 54 | Resp 16 | Wt 163.4 lb

## 2015-07-09 DIAGNOSIS — C61 Malignant neoplasm of prostate: Secondary | ICD-10-CM

## 2015-07-09 DIAGNOSIS — Z510 Encounter for antineoplastic radiation therapy: Secondary | ICD-10-CM | POA: Diagnosis not present

## 2015-07-09 DIAGNOSIS — L241 Irritant contact dermatitis due to oils and greases: Secondary | ICD-10-CM | POA: Diagnosis not present

## 2015-07-09 NOTE — Progress Notes (Signed)
  Radiation Oncology         [phone]   Name: [name] MRN: [id number]   Date: 07/09/2015  DOB: Feb 01, 1936   Weekly Radiation Therapy Management    ICD-9-CM ICD-10-CM   1. Prostate cancer ([place]) 185 C61     Current Dose: 16.2 Gy  Planned Dose:  75 Gy  Narrative The patient presents for routine under treatment assessment.  Weight and vitals stable. Denies pain. Reports nocturia x 3. Denies dysuria or hematuria. Denies leakage or incontinence. Denies difficulty emptying his bladder. Denies frequency or urgency. Denies diarrhea. Has poison ivey. Received a steroid injection today and begins a prednisone taper tomorrow.  Set-up films were reviewed. The chart was checked.  Physical Findings  weight is 163 lb 6.4 oz (74.118 kg). His blood pressure is 157/55 and his pulse is 54. His respiration is 16 and oxygen saturation is 100%. . Weight essentially stable.  No significant changes. Rash noted on bilateral upper extremities.   Impression The patient is tolerating radiation.  Plan Continue treatment as planned.     Sheral Apley Tammi Klippel, M.D.   This document serves as a record of services personally performed by Tyler Pita, MD. It was created on his behalf by Arlyce Harman, a trained medical scribe. The creation of this record is based on the scribe's personal observations and the provider's statements to them. This document has been checked and approved by the attending provider.

## 2015-07-09 NOTE — Progress Notes (Signed)
Weight and vitals stable. Denies pain. Reports nocturia x 3. Denies dysuria or hematuria. Denies leakage or incontinence. Denies difficulty emptying his bladder. Denies frequency or urgency. Denies diarrhea. Has poison ivey. Received a steroid injection today and begins a prednisone taper tomorrow.   BP 157/55 mmHg  Pulse 54  Resp 16  Wt 163 lb 6.4 oz (74.118 kg)  SpO2 100% Wt Readings from Last 3 Encounters:  07/09/15 163 lb 6.4 oz (74.118 kg)  07/02/15 160 lb 6.4 oz (72.757 kg)  06/17/15 161 lb 9.6 oz (73.301 kg)

## 2015-07-12 ENCOUNTER — Ambulatory Visit: Payer: Medicare Other

## 2015-07-12 ENCOUNTER — Ambulatory Visit
Admission: RE | Admit: 2015-07-12 | Discharge: 2015-07-12 | Disposition: A | Discharge status: Home / Self Care | Payer: Medicare Other | Source: Ambulatory Visit | Attending: Radiation Oncology | Admitting: Radiation Oncology

## 2015-07-12 DIAGNOSIS — C61 Malignant neoplasm of prostate: Secondary | ICD-10-CM | POA: Diagnosis not present

## 2015-07-12 DIAGNOSIS — Z510 Encounter for antineoplastic radiation therapy: Secondary | ICD-10-CM | POA: Diagnosis not present

## 2015-07-13 ENCOUNTER — Ambulatory Visit
Admission: RE | Admit: 2015-07-13 | Discharge: 2015-07-13 | Disposition: A | Discharge status: Home / Self Care | Payer: Medicare Other | Source: Ambulatory Visit | Attending: Radiation Oncology | Admitting: Radiation Oncology

## 2015-07-13 ENCOUNTER — Ambulatory Visit: Payer: Medicare Other

## 2015-07-13 DIAGNOSIS — C61 Malignant neoplasm of prostate: Secondary | ICD-10-CM | POA: Diagnosis not present

## 2015-07-13 DIAGNOSIS — Z510 Encounter for antineoplastic radiation therapy: Secondary | ICD-10-CM | POA: Diagnosis not present

## 2015-07-14 ENCOUNTER — Ambulatory Visit: Payer: Medicare Other

## 2015-07-14 ENCOUNTER — Ambulatory Visit
Admission: RE | Admit: 2015-07-14 | Discharge: 2015-07-14 | Disposition: A | Discharge status: Home / Self Care | Payer: Medicare Other | Source: Ambulatory Visit | Attending: Radiation Oncology | Admitting: Radiation Oncology

## 2015-07-14 DIAGNOSIS — Z510 Encounter for antineoplastic radiation therapy: Secondary | ICD-10-CM | POA: Diagnosis not present

## 2015-07-14 DIAGNOSIS — C61 Malignant neoplasm of prostate: Secondary | ICD-10-CM | POA: Diagnosis not present

## 2015-07-15 ENCOUNTER — Ambulatory Visit: Payer: Medicare Other

## 2015-07-15 ENCOUNTER — Ambulatory Visit
Admission: RE | Admit: 2015-07-15 | Discharge: 2015-07-15 | Disposition: A | Discharge status: Home / Self Care | Payer: Medicare Other | Source: Ambulatory Visit | Attending: Radiation Oncology | Admitting: Radiation Oncology

## 2015-07-15 ENCOUNTER — Ambulatory Visit
Admission: RE | Admit: 2015-07-15 | Discharge: 2015-07-15 | Disposition: A | Discharge status: Home / Self Care | Payer: PRIVATE HEALTH INSURANCE | Source: Ambulatory Visit | Attending: Radiation Oncology | Admitting: Radiation Oncology

## 2015-07-15 ENCOUNTER — Encounter: Payer: Self-pay | Admitting: Medical Oncology

## 2015-07-15 VITALS — BP 147/63 | HR 63 | Resp 16 | Wt 158.2 lb

## 2015-07-15 DIAGNOSIS — C61 Malignant neoplasm of prostate: Secondary | ICD-10-CM | POA: Diagnosis not present

## 2015-07-15 DIAGNOSIS — Z510 Encounter for antineoplastic radiation therapy: Secondary | ICD-10-CM | POA: Diagnosis not present

## 2015-07-15 NOTE — Progress Notes (Signed)
Weight and vitals stable. Denies pain. Reports nocturia x 3. Denies dysuria or hematuria. Denies leakage or incontinence. Denies difficulty emptying his bladder. Reports increased urinary frequency during the day. Denies urgency. Denies diarrhea or fatigue. Finished prednisone taper for poison ivy today.   BP 147/63 mmHg  Pulse 63  Resp 16  Wt 158 lb 3.2 oz (71.759 kg)  SpO2 100% Wt Readings from Last 3 Encounters:  07/15/15 158 lb 3.2 oz (71.759 kg)  07/09/15 163 lb 6.4 oz (74.118 kg)  07/02/15 160 lb 6.4 oz (72.757 kg)

## 2015-07-15 NOTE — Progress Notes (Signed)
  Radiation Oncology         [phone]   Name: [name] MRN: [id number]   Date: 07/15/2015  DOB: Mar 07, 1936   Weekly Radiation Therapy Management    ICD-9-CM ICD-10-CM   1. Prostate cancer ([place]) 185 C61     Current Dose: 23.4 Gy  Planned Dose:  75 Gy  Narrative The patient presents for routine under treatment assessment.  Weight and vitals stable. Denies pain. Reports nocturia x 3. Denies dysuria or hematuria. Denies leakage or incontinence. Denies difficulty emptying his bladder. Reports increased urinary frequency during the day. Denies urgency. Denies diarrhea or fatigue. Finished prednisone taper for poison ivy today.  Set-up films were reviewed. The chart was checked.  Physical Findings  weight is 158 lb 3.2 oz (71.759 kg). His blood pressure is 147/63 and his pulse is 63. His respiration is 16 and oxygen saturation is 100%. . Weight essentially stable.  No significant changes. Rash noted on bilateral upper extremities.   Impression The patient is tolerating radiation.  Plan Continue treatment as planned.     Sheral Apley Tammi Klippel, M.D.    This document serves as a record of services personally performed by Tyler Pita, MD. It was created on his behalf by Lendon Collar, a trained medical scribe. The creation of this record is based on the scribe's personal observations and the provider's statements to them. This document has been checked and approved by the attending provider.

## 2015-07-16 ENCOUNTER — Ambulatory Visit: Payer: Medicare Other

## 2015-07-16 ENCOUNTER — Ambulatory Visit
Admission: RE | Admit: 2015-07-16 | Discharge: 2015-07-16 | Disposition: A | Discharge status: Home / Self Care | Payer: Medicare Other | Source: Ambulatory Visit | Attending: Radiation Oncology | Admitting: Radiation Oncology

## 2015-07-16 DIAGNOSIS — I10 Essential (primary) hypertension: Secondary | ICD-10-CM | POA: Diagnosis not present

## 2015-07-16 DIAGNOSIS — Z0000 Encounter for general adult medical examination without abnormal findings: Secondary | ICD-10-CM | POA: Diagnosis not present

## 2015-07-16 DIAGNOSIS — Z510 Encounter for antineoplastic radiation therapy: Secondary | ICD-10-CM | POA: Diagnosis not present

## 2015-07-16 DIAGNOSIS — N182 Chronic kidney disease, stage 2 (mild): Secondary | ICD-10-CM | POA: Diagnosis not present

## 2015-07-16 DIAGNOSIS — E1122 Type 2 diabetes mellitus with diabetic chronic kidney disease: Secondary | ICD-10-CM | POA: Diagnosis not present

## 2015-07-16 DIAGNOSIS — Z794 Long term (current) use of insulin: Secondary | ICD-10-CM | POA: Diagnosis not present

## 2015-07-16 DIAGNOSIS — D509 Iron deficiency anemia, unspecified: Secondary | ICD-10-CM | POA: Diagnosis not present

## 2015-07-16 DIAGNOSIS — C61 Malignant neoplasm of prostate: Secondary | ICD-10-CM | POA: Diagnosis not present

## 2015-07-16 DIAGNOSIS — Z1389 Encounter for screening for other disorder: Secondary | ICD-10-CM | POA: Diagnosis not present

## 2015-07-16 DIAGNOSIS — E1142 Type 2 diabetes mellitus with diabetic polyneuropathy: Secondary | ICD-10-CM | POA: Diagnosis not present

## 2015-07-16 DIAGNOSIS — K219 Gastro-esophageal reflux disease without esophagitis: Secondary | ICD-10-CM | POA: Diagnosis not present

## 2015-07-16 DIAGNOSIS — E7800 Pure hypercholesterolemia, unspecified: Secondary | ICD-10-CM | POA: Diagnosis not present

## 2015-07-16 NOTE — Progress Notes (Signed)
Oncology Nurse Navigator Documentation  Oncology Nurse Navigator Flowsheets 06/30/2015 07/07/2015 07/15/2015  Navigator Location - - -  Navigator Encounter Type Treatment Treatment Treatment  Telephone - - -  Abnormal Finding Date - - -  Confirmed Diagnosis Date - - -  Surgery Date - - -  Patient Visit Type - - RadOnc;Follow-up  Treatment Phase First Chemo Tx Treatment Treatment  Barriers/Navigation Needs No barriers at this time No barriers at this time No barriers at this time- [name] states he is doing well. He has nocturia x 3 but no other side effects.   Interventions None required None required None required  Coordination of Care - - -  Support Groups/Services Friends and Family Friends and Family Friends and Family  Acuity - - -  Acuity Level 1 - - -  Acuity Level 2 - - -  Time Spent with Patient [id number]

## 2015-07-19 ENCOUNTER — Ambulatory Visit
Admission: RE | Admit: 2015-07-19 | Discharge: 2015-07-19 | Disposition: A | Discharge status: Home / Self Care | Payer: Medicare Other | Source: Ambulatory Visit | Attending: Radiation Oncology | Admitting: Radiation Oncology

## 2015-07-19 ENCOUNTER — Ambulatory Visit: Payer: Medicare Other

## 2015-07-19 DIAGNOSIS — C61 Malignant neoplasm of prostate: Secondary | ICD-10-CM | POA: Diagnosis not present

## 2015-07-19 DIAGNOSIS — Z510 Encounter for antineoplastic radiation therapy: Secondary | ICD-10-CM | POA: Diagnosis not present

## 2015-07-20 ENCOUNTER — Ambulatory Visit
Admission: RE | Admit: 2015-07-20 | Discharge: 2015-07-20 | Disposition: A | Discharge status: Home / Self Care | Payer: Medicare Other | Source: Ambulatory Visit | Attending: Radiation Oncology | Admitting: Radiation Oncology

## 2015-07-20 ENCOUNTER — Ambulatory Visit: Payer: Medicare Other

## 2015-07-20 DIAGNOSIS — C61 Malignant neoplasm of prostate: Secondary | ICD-10-CM | POA: Diagnosis not present

## 2015-07-20 DIAGNOSIS — Z510 Encounter for antineoplastic radiation therapy: Secondary | ICD-10-CM | POA: Diagnosis not present

## 2015-07-21 ENCOUNTER — Ambulatory Visit: Payer: Medicare Other

## 2015-07-22 ENCOUNTER — Ambulatory Visit: Payer: Medicare Other

## 2015-07-22 ENCOUNTER — Ambulatory Visit
Admission: RE | Admit: 2015-07-22 | Discharge: 2015-07-22 | Disposition: A | Discharge status: Home / Self Care | Payer: Medicare Other | Source: Ambulatory Visit | Attending: Radiation Oncology | Admitting: Radiation Oncology

## 2015-07-22 DIAGNOSIS — Z85828 Personal history of other malignant neoplasm of skin: Secondary | ICD-10-CM | POA: Diagnosis not present

## 2015-07-22 DIAGNOSIS — L237 Allergic contact dermatitis due to plants, except food: Secondary | ICD-10-CM | POA: Diagnosis not present

## 2015-07-23 ENCOUNTER — Ambulatory Visit
Admission: RE | Admit: 2015-07-23 | Discharge: 2015-07-23 | Disposition: A | Discharge status: Home / Self Care | Payer: Medicare Other | Source: Ambulatory Visit | Attending: Radiation Oncology | Admitting: Radiation Oncology

## 2015-07-23 ENCOUNTER — Encounter: Payer: Self-pay | Admitting: Radiation Oncology

## 2015-07-23 ENCOUNTER — Ambulatory Visit: Payer: Medicare Other

## 2015-07-23 VITALS — BP 173/73 | HR 54 | Temp 97.7°F | Resp 20 | Wt 158.9 lb

## 2015-07-23 DIAGNOSIS — Z510 Encounter for antineoplastic radiation therapy: Secondary | ICD-10-CM | POA: Diagnosis not present

## 2015-07-23 DIAGNOSIS — C61 Malignant neoplasm of prostate: Secondary | ICD-10-CM | POA: Diagnosis not present

## 2015-07-23 NOTE — Progress Notes (Signed)
wekly rad txs prostate  Has had 16 completed so far,here before tx, frequency.k, and loose stools 3-4x day,  Takes pepto bismul (swig) at night stated, no hematuria,  Nocturia x3, no pain,  Appetite good,  5:36 PM BP 173/73 mmHg  Pulse 54  Temp(Src) 97.7 F (36.5 C) (Oral)  Resp 20  Wt 158 lb 14.4 oz (72.077 kg)  Wt Readings from Last 3 Encounters:  07/23/15 158 lb 14.4 oz (72.077 kg)  07/15/15 158 lb 3.2 oz (71.759 kg)  07/09/15 163 lb 6.4 oz (74.118 kg)

## 2015-07-23 NOTE — Progress Notes (Signed)
  Radiation Oncology         [phone]   Name: NORMAND DOLL MRN: [id number]   Date: 07/23/2015  DOB: Sep 25, 1935   Weekly Radiation Therapy Management    ICD-9-CM ICD-10-CM   1. Prostate cancer ([place]) 185 C61     Current Dose: 28.8 Gy  Planned Dose:  68.4 Gy  Narrative The patient presents for routine under treatment assessment.  Has had 16 completed so far,here before tx, frequency.k, and loose stools 3-4x day, Takes pepto bismul (swig) at night stated, no hematuria, Nocturia x3, no pain, Appetite good,   Set-up films were reviewed. The chart was checked.  Physical Findings  weight is 158 lb 14.4 oz (72.077 kg). His oral temperature is 97.7 F (36.5 C). His blood pressure is 173/73 and his pulse is 54. His respiration is 20. . Weight essentially stable.  No significant changes.   Impression The patient is tolerating radiation.  Plan Continue treatment as planned.     Sheral Apley Tammi Klippel, M.D.    This document serves as a record of services personally performed by Tyler Pita, MD. It was created on his behalf by Arlyce Harman, a trained medical scribe. The creation of this record is based on the scribe's personal observations and the provider's statements to them. This document has been checked and approved by the attending provider.

## 2015-07-24 ENCOUNTER — Ambulatory Visit: Payer: Medicare Other

## 2015-07-26 ENCOUNTER — Ambulatory Visit: Payer: Medicare Other

## 2015-07-26 ENCOUNTER — Ambulatory Visit
Admission: RE | Admit: 2015-07-26 | Discharge: 2015-07-26 | Disposition: A | Discharge status: Home / Self Care | Payer: Medicare Other | Source: Ambulatory Visit | Attending: Radiation Oncology | Admitting: Radiation Oncology

## 2015-07-26 DIAGNOSIS — Z510 Encounter for antineoplastic radiation therapy: Secondary | ICD-10-CM | POA: Diagnosis not present

## 2015-07-26 DIAGNOSIS — C61 Malignant neoplasm of prostate: Secondary | ICD-10-CM | POA: Diagnosis not present

## 2015-07-27 ENCOUNTER — Ambulatory Visit
Admission: RE | Admit: 2015-07-27 | Discharge: 2015-07-27 | Disposition: A | Discharge status: Home / Self Care | Payer: Medicare Other | Source: Ambulatory Visit | Attending: Radiation Oncology | Admitting: Radiation Oncology

## 2015-07-27 ENCOUNTER — Ambulatory Visit: Payer: Medicare Other

## 2015-07-27 DIAGNOSIS — Z510 Encounter for antineoplastic radiation therapy: Secondary | ICD-10-CM | POA: Diagnosis not present

## 2015-07-27 DIAGNOSIS — C61 Malignant neoplasm of prostate: Secondary | ICD-10-CM | POA: Diagnosis not present

## 2015-07-28 ENCOUNTER — Ambulatory Visit: Payer: Medicare Other

## 2015-07-28 ENCOUNTER — Ambulatory Visit
Admission: RE | Admit: 2015-07-28 | Discharge: 2015-07-28 | Disposition: A | Discharge status: Home / Self Care | Payer: Medicare Other | Source: Ambulatory Visit | Attending: Radiation Oncology | Admitting: Radiation Oncology

## 2015-07-28 DIAGNOSIS — Z510 Encounter for antineoplastic radiation therapy: Secondary | ICD-10-CM | POA: Diagnosis not present

## 2015-07-28 DIAGNOSIS — C61 Malignant neoplasm of prostate: Secondary | ICD-10-CM | POA: Diagnosis not present

## 2015-07-29 ENCOUNTER — Ambulatory Visit
Admission: RE | Admit: 2015-07-29 | Discharge: 2015-07-29 | Disposition: A | Discharge status: Home / Self Care | Payer: Medicare Other | Source: Ambulatory Visit | Attending: Radiation Oncology | Admitting: Radiation Oncology

## 2015-07-29 ENCOUNTER — Ambulatory Visit: Payer: Medicare Other

## 2015-07-29 DIAGNOSIS — C61 Malignant neoplasm of prostate: Secondary | ICD-10-CM | POA: Diagnosis not present

## 2015-07-29 DIAGNOSIS — Z510 Encounter for antineoplastic radiation therapy: Secondary | ICD-10-CM | POA: Diagnosis not present

## 2015-07-30 ENCOUNTER — Encounter: Payer: Self-pay | Admitting: Radiation Oncology

## 2015-07-30 ENCOUNTER — Ambulatory Visit
Admission: RE | Admit: 2015-07-30 | Discharge: 2015-07-30 | Disposition: A | Discharge status: Home / Self Care | Payer: Medicare Other | Source: Ambulatory Visit | Attending: Radiation Oncology | Admitting: Radiation Oncology

## 2015-07-30 ENCOUNTER — Ambulatory Visit: Payer: Medicare Other

## 2015-07-30 VITALS — BP 176/64 | HR 59 | Resp 16 | Wt 158.0 lb

## 2015-07-30 DIAGNOSIS — C61 Malignant neoplasm of prostate: Secondary | ICD-10-CM | POA: Diagnosis not present

## 2015-07-30 DIAGNOSIS — Z510 Encounter for antineoplastic radiation therapy: Secondary | ICD-10-CM | POA: Diagnosis not present

## 2015-07-30 NOTE — Progress Notes (Signed)
  Radiation Oncology         [phone]   Name: [name] MRN: [id number]   Date: 07/30/2015  DOB: 1935/12/09   Weekly Radiation Therapy Management    ICD-9-CM ICD-10-CM   1. Prostate cancer ([place]) 185 C61     Current Dose: 39.6 Gy  Planned Dose:  75 Gy  Narrative The patient presents for routine under treatment assessment.  Weight and vitals stable. Denies pain. Reports nocturia x 3-5. Denies hematuria. Reports mild dysuria toward the end of void. Denies leakage or incontinence. Denies difficulty emptying his bladder. Reports increased urinary frequency during the day. Denies urgency. Reports intermittent episodes of diarrhea with increased frequency and urgency.    Set-up films were reviewed. The chart was checked.  Physical Findings  weight is 158 lb (71.668 kg). His blood pressure is 176/64 and his pulse is 59. His respiration is 16 and oxygen saturation is 100%. . Weight essentially stable.  No significant changes.   Impression The patient is tolerating radiation.  Plan Continue treatment as planned.     Sheral Apley Tammi Klippel, M.D.  This document serves as a record of services personally performed by Tyler Pita, MD. It was created on his behalf by Derek Mound, a trained medical scribe. The creation of this record is based on the scribe's personal observations and the provider's statements to them. This document has been checked and approved by the attending provider.

## 2015-07-30 NOTE — Progress Notes (Signed)
Weight and vitals stable. Denies pain. Reports nocturia x 3-5. Denies hematuria. Reports mild dysuria toward the end of void.  Denies leakage or incontinence. Denies difficulty emptying his bladder. Reports increased urinary frequency during the day. Denies urgency. Reports intermittent episodes of diarrhea with increased frequency and urgency.   BP 176/64 mmHg  Pulse 59  Resp 16  Wt 158 lb (71.668 kg)  SpO2 100% Wt Readings from Last 3 Encounters:  07/30/15 158 lb (71.668 kg)  07/23/15 158 lb 14.4 oz (72.077 kg)  07/15/15 158 lb 3.2 oz (71.759 kg)

## 2015-08-02 ENCOUNTER — Ambulatory Visit: Payer: Medicare Other

## 2015-08-02 ENCOUNTER — Ambulatory Visit
Admission: RE | Admit: 2015-08-02 | Discharge: 2015-08-02 | Disposition: A | Discharge status: Home / Self Care | Payer: Medicare Other | Source: Ambulatory Visit | Attending: Radiation Oncology | Admitting: Radiation Oncology

## 2015-08-02 DIAGNOSIS — Z510 Encounter for antineoplastic radiation therapy: Secondary | ICD-10-CM | POA: Diagnosis not present

## 2015-08-02 DIAGNOSIS — C61 Malignant neoplasm of prostate: Secondary | ICD-10-CM | POA: Diagnosis not present

## 2015-08-03 ENCOUNTER — Ambulatory Visit: Payer: Medicare Other

## 2015-08-03 ENCOUNTER — Ambulatory Visit
Admission: RE | Admit: 2015-08-03 | Discharge: 2015-08-03 | Disposition: A | Discharge status: Home / Self Care | Payer: Medicare Other | Source: Ambulatory Visit | Attending: Radiation Oncology | Admitting: Radiation Oncology

## 2015-08-03 DIAGNOSIS — C61 Malignant neoplasm of prostate: Secondary | ICD-10-CM | POA: Diagnosis not present

## 2015-08-03 DIAGNOSIS — Z510 Encounter for antineoplastic radiation therapy: Secondary | ICD-10-CM | POA: Diagnosis not present

## 2015-08-04 ENCOUNTER — Ambulatory Visit: Payer: Medicare Other

## 2015-08-04 DIAGNOSIS — C61 Malignant neoplasm of prostate: Secondary | ICD-10-CM | POA: Diagnosis not present

## 2015-08-04 DIAGNOSIS — Z510 Encounter for antineoplastic radiation therapy: Secondary | ICD-10-CM | POA: Diagnosis not present

## 2015-08-05 ENCOUNTER — Ambulatory Visit: Payer: Medicare Other

## 2015-08-05 DIAGNOSIS — Z510 Encounter for antineoplastic radiation therapy: Secondary | ICD-10-CM | POA: Diagnosis not present

## 2015-08-05 DIAGNOSIS — C61 Malignant neoplasm of prostate: Secondary | ICD-10-CM | POA: Diagnosis not present

## 2015-08-06 ENCOUNTER — Ambulatory Visit: Payer: Medicare Other

## 2015-08-06 ENCOUNTER — Ambulatory Visit
Admission: RE | Admit: 2015-08-06 | Discharge: 2015-08-06 | Disposition: A | Discharge status: Home / Self Care | Payer: Medicare Other | Source: Ambulatory Visit | Attending: Radiation Oncology | Admitting: Radiation Oncology

## 2015-08-06 ENCOUNTER — Encounter: Payer: Self-pay | Admitting: Radiation Oncology

## 2015-08-06 VITALS — BP 158/58 | HR 57 | Resp 16 | Wt 162.4 lb

## 2015-08-06 DIAGNOSIS — Z510 Encounter for antineoplastic radiation therapy: Secondary | ICD-10-CM | POA: Diagnosis not present

## 2015-08-06 DIAGNOSIS — C61 Malignant neoplasm of prostate: Secondary | ICD-10-CM | POA: Diagnosis not present

## 2015-08-06 NOTE — Progress Notes (Signed)
Weight and vitals stable. Denies pain. Reports nocturia x 3-5 continues. Reports urinary frequency. Denies hematuria. Reports mild dysuria. Denies leakage or incontinence. Denies difficulty emptying his bladder. Denies urgency. Reports intermittent episodes of diarrhea with increased frequency and urgency. Denies rectal irritation. Reports increased fatigue. Concerned about bloating.   BP 158/58 mmHg  Pulse 57  Resp 16  Wt 162 lb 6.4 oz (73.664 kg)  SpO2 100%   Wt Readings from Last 3 Encounters:  08/06/15 162 lb 6.4 oz (73.664 kg)  07/30/15 158 lb (71.668 kg)  07/23/15 158 lb 14.4 oz (72.077 kg)

## 2015-08-06 NOTE — Progress Notes (Signed)
  Radiation Oncology         [phone]   Name: [name] MRN: [id number]   Date: 08/06/2015  DOB: 1935/04/19   Weekly Radiation Therapy Management    ICD-9-CM ICD-10-CM   1. Prostate cancer ([place]) 185 C61     Current Dose: 49 Gy  Planned Dose:  75 Gy  Narrative The patient presents for routine under treatment assessment.  Weight and vitals stable. Denies pain. Reports nocturia x 3-5 continues. Reports urinary frequency. Denies hematuria. Reports mild dysuria. Denies leakage or incontinence. Denies difficulty emptying his bladder. Denies urgency. Reports intermittent episodes of diarrhea with increased frequency and urgency. Denies rectal irritation. Reports increased fatigue. Concerned about bloating.   Set-up films were reviewed. The chart was checked.  Physical Findings  weight is 162 lb 6.4 oz (73.664 kg). His blood pressure is 158/58 and his pulse is 57. His respiration is 16 and oxygen saturation is 100%. . Weight essentially stable.  No significant changes.   Impression The patient is tolerating radiation.  Plan Continue treatment as planned.     [name], M.D.  This document serves as a record of services personally performed by Tyler Pita, MD. It was created on his behalf by Derek Mound, a trained medical scribe. The creation of this record is based on the scribe's personal observations and the provider's statements to them. This document has been checked and approved by the attending provider.

## 2015-08-09 ENCOUNTER — Ambulatory Visit
Admission: RE | Admit: 2015-08-09 | Discharge: 2015-08-09 | Disposition: A | Discharge status: Home / Self Care | Payer: Medicare Other | Source: Ambulatory Visit | Attending: Radiation Oncology | Admitting: Radiation Oncology

## 2015-08-09 ENCOUNTER — Ambulatory Visit: Payer: Medicare Other

## 2015-08-09 DIAGNOSIS — Z510 Encounter for antineoplastic radiation therapy: Secondary | ICD-10-CM | POA: Diagnosis not present

## 2015-08-09 DIAGNOSIS — C61 Malignant neoplasm of prostate: Secondary | ICD-10-CM | POA: Diagnosis not present

## 2015-08-10 ENCOUNTER — Ambulatory Visit: Payer: Medicare Other

## 2015-08-10 ENCOUNTER — Telehealth: Payer: Self-pay | Admitting: Radiation Oncology

## 2015-08-10 ENCOUNTER — Ambulatory Visit
Admission: RE | Admit: 2015-08-10 | Discharge: 2015-08-10 | Disposition: A | Discharge status: Home / Self Care | Payer: Medicare Other | Source: Ambulatory Visit | Attending: Radiation Oncology | Admitting: Radiation Oncology

## 2015-08-10 DIAGNOSIS — C61 Malignant neoplasm of prostate: Secondary | ICD-10-CM | POA: Diagnosis not present

## 2015-08-10 DIAGNOSIS — Z510 Encounter for antineoplastic radiation therapy: Secondary | ICD-10-CM | POA: Diagnosis not present

## 2015-08-10 NOTE — Telephone Encounter (Signed)
Phoned patient to confirm [facility] reach out reference next ADT appointment. No answer at home or on his cell. Left message on cell requesting return call.

## 2015-08-11 ENCOUNTER — Ambulatory Visit
Admission: RE | Admit: 2015-08-11 | Discharge: 2015-08-11 | Disposition: A | Discharge status: Home / Self Care | Payer: Medicare Other | Source: Ambulatory Visit | Attending: Radiation Oncology | Admitting: Radiation Oncology

## 2015-08-11 ENCOUNTER — Ambulatory Visit: Payer: Medicare Other

## 2015-08-11 DIAGNOSIS — C61 Malignant neoplasm of prostate: Secondary | ICD-10-CM | POA: Diagnosis not present

## 2015-08-11 DIAGNOSIS — Z510 Encounter for antineoplastic radiation therapy: Secondary | ICD-10-CM | POA: Diagnosis not present

## 2015-08-12 ENCOUNTER — Ambulatory Visit: Payer: Medicare Other

## 2015-08-12 ENCOUNTER — Ambulatory Visit
Admission: RE | Admit: 2015-08-12 | Discharge: 2015-08-12 | Disposition: A | Discharge status: Home / Self Care | Payer: Medicare Other | Source: Ambulatory Visit | Attending: Radiation Oncology | Admitting: Radiation Oncology

## 2015-08-12 DIAGNOSIS — C61 Malignant neoplasm of prostate: Secondary | ICD-10-CM | POA: Diagnosis not present

## 2015-08-12 DIAGNOSIS — Z510 Encounter for antineoplastic radiation therapy: Secondary | ICD-10-CM | POA: Diagnosis not present

## 2015-08-13 ENCOUNTER — Ambulatory Visit: Payer: Medicare Other

## 2015-08-13 ENCOUNTER — Encounter: Payer: Self-pay | Admitting: Radiation Oncology

## 2015-08-13 ENCOUNTER — Ambulatory Visit
Admission: RE | Admit: 2015-08-13 | Discharge: 2015-08-13 | Disposition: A | Discharge status: Home / Self Care | Payer: Medicare Other | Source: Ambulatory Visit | Attending: Radiation Oncology | Admitting: Radiation Oncology

## 2015-08-13 VITALS — BP 125/51 | HR 66 | Resp 16 | Wt 161.8 lb

## 2015-08-13 DIAGNOSIS — C61 Malignant neoplasm of prostate: Secondary | ICD-10-CM

## 2015-08-13 DIAGNOSIS — Z510 Encounter for antineoplastic radiation therapy: Secondary | ICD-10-CM | POA: Diagnosis not present

## 2015-08-13 NOTE — Progress Notes (Signed)
  Radiation Oncology         [phone]   Name: [name] MRN: [id number]   Date: 08/13/2015  DOB: November 14, 1935   Weekly Radiation Therapy Management    ICD-9-CM ICD-10-CM   1. Prostate cancer ([place]) 185 C61     Current Dose: 59 Gy  Planned Dose:  75 Gy  Narrative The patient presents for routine under treatment assessment.  Weight and vitals stable. Denies pain. Reports nocturia x 3-5 continues. Reports urinary frequency. Denies hematuria. Reports mild dysuria. Denies leakage or incontinence. Denies difficulty emptying his bladder. Denies urgency. Reports intermittent episodes of diarrhea with increased frequency and urgency. Denies rectal irritation. Reports increased fatigue. Bloating unchanged.   Set-up films were reviewed. The chart was checked.  Physical Findings  weight is 161 lb 12.8 oz (73.392 kg). His blood pressure is 125/51 and his pulse is 66. His respiration is 16 and oxygen saturation is 100%. . Weight essentially stable.  No significant changes.   Impression The patient is tolerating radiation.  Plan Continue treatment as planned.     Sheral Apley Tammi Klippel, M.D.    This document serves as a record of services personally performed by Tyler Pita, MD. It was created on his behalf by Lendon Collar, a trained medical scribe. The creation of this record is based on the scribe's personal observations and the provider's statements to them. This document has been checked and approved by the attending provider.

## 2015-08-13 NOTE — Progress Notes (Addendum)
Weight and vitals stable. Denies pain. Reports nocturia x 3-5 continues. Reports urinary frequency. Denies hematuria. Reports mild dysuria. Denies leakage or incontinence. Denies difficulty emptying his bladder. Denies urgency. Reports intermittent episodes of diarrhea with increased frequency and urgency. Denies rectal irritation. Reports increased fatigue. Bloating unchanged. Understands his next ADT is scheduled for July.  BP 125/51 mmHg  Pulse 66  Resp 16  Wt 161 lb 12.8 oz (73.392 kg)  SpO2 100% Wt Readings from Last 3 Encounters:  08/13/15 161 lb 12.8 oz (73.392 kg)  08/06/15 162 lb 6.4 oz (73.664 kg)  07/30/15 158 lb (71.668 kg)

## 2015-08-17 ENCOUNTER — Ambulatory Visit: Payer: Medicare Other

## 2015-08-17 ENCOUNTER — Ambulatory Visit
Admission: RE | Admit: 2015-08-17 | Discharge: 2015-08-17 | Disposition: A | Discharge status: Home / Self Care | Payer: Medicare Other | Source: Ambulatory Visit | Attending: Radiation Oncology | Admitting: Radiation Oncology

## 2015-08-17 DIAGNOSIS — Z510 Encounter for antineoplastic radiation therapy: Secondary | ICD-10-CM | POA: Diagnosis not present

## 2015-08-17 DIAGNOSIS — C61 Malignant neoplasm of prostate: Secondary | ICD-10-CM | POA: Diagnosis not present

## 2015-08-18 ENCOUNTER — Ambulatory Visit
Admission: RE | Admit: 2015-08-18 | Discharge: 2015-08-18 | Disposition: A | Discharge status: Home / Self Care | Payer: Medicare Other | Source: Ambulatory Visit | Attending: Radiation Oncology | Admitting: Radiation Oncology

## 2015-08-18 ENCOUNTER — Ambulatory Visit: Payer: Medicare Other

## 2015-08-18 DIAGNOSIS — C61 Malignant neoplasm of prostate: Secondary | ICD-10-CM | POA: Diagnosis not present

## 2015-08-18 DIAGNOSIS — Z510 Encounter for antineoplastic radiation therapy: Secondary | ICD-10-CM | POA: Diagnosis not present

## 2015-08-19 ENCOUNTER — Ambulatory Visit: Payer: Medicare Other

## 2015-08-19 ENCOUNTER — Ambulatory Visit
Admission: RE | Admit: 2015-08-19 | Discharge: 2015-08-19 | Disposition: A | Discharge status: Home / Self Care | Payer: Medicare Other | Source: Ambulatory Visit | Attending: Radiation Oncology | Admitting: Radiation Oncology

## 2015-08-19 DIAGNOSIS — Z510 Encounter for antineoplastic radiation therapy: Secondary | ICD-10-CM | POA: Diagnosis not present

## 2015-08-19 DIAGNOSIS — C61 Malignant neoplasm of prostate: Secondary | ICD-10-CM | POA: Diagnosis not present

## 2015-08-20 ENCOUNTER — Ambulatory Visit: Payer: Medicare Other

## 2015-08-20 ENCOUNTER — Ambulatory Visit
Admission: RE | Admit: 2015-08-20 | Discharge: 2015-08-20 | Disposition: A | Discharge status: Home / Self Care | Payer: Medicare Other | Source: Ambulatory Visit | Attending: Radiation Oncology | Admitting: Radiation Oncology

## 2015-08-20 ENCOUNTER — Encounter: Payer: Self-pay | Admitting: Radiation Oncology

## 2015-08-20 VITALS — BP 139/59 | HR 62 | Resp 16 | Wt 162.5 lb

## 2015-08-20 DIAGNOSIS — Z510 Encounter for antineoplastic radiation therapy: Secondary | ICD-10-CM | POA: Diagnosis not present

## 2015-08-20 DIAGNOSIS — C61 Malignant neoplasm of prostate: Secondary | ICD-10-CM

## 2015-08-20 NOTE — Progress Notes (Signed)
  Radiation Oncology         [phone]   Name: [name] MRN: [id number]   Date: 08/20/2015  DOB: [birth year]   Weekly Radiation Therapy Management    ICD-9-CM ICD-10-CM   1. Prostate cancer ([place]) 185 C61     Current Dose: 65 Gy  Planned Dose:  75 Gy  Narrative The patient presents for routine under treatment assessment.  Weight and vitals stable. Denies pain. Reports increased fatigue. Reports nocturia x 3-5. Reports urinary frequency. Denies hematuria. Reports dysuria is more pronounced. Denies leakage or incontinence. Denies difficulty emptying his bladder. Denies urgency. Reports frequent episodes of diarrhea. He mentions that he has a bowel movement 5 or 6 times a day but has diarrhea about 1 or 2 times a day.  Set-up films were reviewed. The chart was checked.  Physical Findings  weight is 162 lb 8 oz (73.71 kg). His blood pressure is 139/59 and his pulse is 62. His respiration is 16 and oxygen saturation is 100%. . Weight essentially stable.  No significant changes.   Impression The patient is tolerating radiation.  Plan Continue treatment as planned. We discussed that he should start taking the Imodium in the morning to manage his bowels.      Sheral Apley Tammi Klippel, M.D.    This document serves as a record of services personally performed by Tyler Pita, MD. It was created on his behalf by Lendon Collar, a trained medical scribe. The creation of this record is based on the scribe's personal observations and the provider's statements to them. This document has been checked and approved by the attending provider.

## 2015-08-20 NOTE — Progress Notes (Signed)
Weight and vitals stable. Denies pain. Reports increased fatigue. Reports nocturia x 3-5. Reports urinary frequency. Denies hematuria. Reports dysuria is more pronounced. Denies leakage or incontinence. Denies difficulty emptying his bladder. Denies urgency. Reports frequent episodes of diarrhea.   BP 139/59 mmHg  Pulse 62  Resp 16  Wt 162 lb 8 oz (73.71 kg)  SpO2 100% Wt Readings from Last 3 Encounters:  08/20/15 162 lb 8 oz (73.71 kg)  08/13/15 161 lb 12.8 oz (73.392 kg)  08/06/15 162 lb 6.4 oz (73.664 kg)

## 2015-08-21 ENCOUNTER — Ambulatory Visit: Payer: Medicare Other

## 2015-08-23 ENCOUNTER — Ambulatory Visit: Payer: Medicare Other

## 2015-08-23 ENCOUNTER — Ambulatory Visit
Admission: RE | Admit: 2015-08-23 | Discharge: 2015-08-23 | Disposition: A | Discharge status: Home / Self Care | Payer: Medicare Other | Source: Ambulatory Visit | Attending: Radiation Oncology | Admitting: Radiation Oncology

## 2015-08-23 DIAGNOSIS — C61 Malignant neoplasm of prostate: Secondary | ICD-10-CM | POA: Diagnosis not present

## 2015-08-23 DIAGNOSIS — Z510 Encounter for antineoplastic radiation therapy: Secondary | ICD-10-CM | POA: Diagnosis not present

## 2015-08-24 ENCOUNTER — Ambulatory Visit: Payer: Medicare Other

## 2015-08-24 ENCOUNTER — Ambulatory Visit
Admission: RE | Admit: 2015-08-24 | Discharge: 2015-08-24 | Disposition: A | Discharge status: Home / Self Care | Payer: Medicare Other | Source: Ambulatory Visit | Attending: Radiation Oncology | Admitting: Radiation Oncology

## 2015-08-24 ENCOUNTER — Ambulatory Visit: Admission: RE | Admit: 2015-08-24 | Payer: Medicare Other | Source: Ambulatory Visit

## 2015-08-24 DIAGNOSIS — C61 Malignant neoplasm of prostate: Secondary | ICD-10-CM | POA: Diagnosis not present

## 2015-08-24 DIAGNOSIS — Z510 Encounter for antineoplastic radiation therapy: Secondary | ICD-10-CM | POA: Diagnosis not present

## 2015-08-25 ENCOUNTER — Ambulatory Visit: Payer: Medicare Other

## 2015-08-25 ENCOUNTER — Ambulatory Visit
Admission: RE | Admit: 2015-08-25 | Discharge: 2015-08-25 | Disposition: A | Discharge status: Home / Self Care | Payer: Medicare Other | Source: Ambulatory Visit | Attending: Radiation Oncology | Admitting: Radiation Oncology

## 2015-08-25 DIAGNOSIS — C61 Malignant neoplasm of prostate: Secondary | ICD-10-CM | POA: Diagnosis not present

## 2015-08-25 DIAGNOSIS — Z510 Encounter for antineoplastic radiation therapy: Secondary | ICD-10-CM | POA: Diagnosis not present

## 2015-08-26 ENCOUNTER — Ambulatory Visit
Admission: RE | Admit: 2015-08-26 | Discharge: 2015-08-26 | Disposition: A | Discharge status: Home / Self Care | Payer: Medicare Other | Source: Ambulatory Visit | Attending: Radiation Oncology | Admitting: Radiation Oncology

## 2015-08-26 ENCOUNTER — Encounter: Payer: Self-pay | Admitting: Medical Oncology

## 2015-08-26 ENCOUNTER — Encounter: Payer: Self-pay | Admitting: Radiation Oncology

## 2015-08-26 ENCOUNTER — Ambulatory Visit: Payer: Medicare Other

## 2015-08-26 DIAGNOSIS — C61 Malignant neoplasm of prostate: Secondary | ICD-10-CM | POA: Diagnosis not present

## 2015-08-26 DIAGNOSIS — Z510 Encounter for antineoplastic radiation therapy: Secondary | ICD-10-CM | POA: Diagnosis not present

## 2015-08-26 NOTE — Progress Notes (Signed)
Oncology Nurse Navigator Documentation  Oncology Nurse Navigator Flowsheets 07/07/2015 07/15/2015 08/26/2015  Navigator Location - - -  Navigator Encounter Type Treatment Treatment Treatment  Telephone - - -  Abnormal Finding Date - - -  Confirmed Diagnosis Date - - -  Surgery Date - - -  Patient Visit Type - RadOnc;Follow-up RadOnc  Treatment Phase Treatment Treatment Final Radiation Tx- Celebrated with [name] as he rang the bell after the completion of his radiation treatments. He has tolerated the treatments with fatigue being his major side effect. He follows up with Dr. Tammi Klippel 09/30/15. I asked him to call me with any questions or concerns.He voiced understanding.  Barriers/Navigation Needs No barriers at this time No barriers at this time No barriers at this time  Interventions None required None required -  Coordination of Care - - -  Support Groups/Services Friends and Family Friends and Family Friends and Family  Acuity - - -  Acuity Level 1 - - -  Acuity Level 2 - - -  Time Spent with Patient 15 15 30

## 2015-09-06 NOTE — Progress Notes (Signed)
  Radiation Oncology         [phone] ________________________________  Name: [name] MRN: [id number]  Date: 08/26/2015  DOB: 09-16-1935  End of Treatment Note   ICD-9-CM ICD-10-CM    1. Prostate cancer ([place]) 185 C61     DIAGNOSIS: 80 y.o. gentleman with stage T2 adenocarcinoma of the prostate with a Gleason's score of 4+5 and a PSA of 2.21 (on Finasteride)     Indication for treatment:  Curative, Definitive Radiotherapy       Radiation treatment dates:   06/29/2015-08/26/2015  Site/dose:  1. The prostate, seminal vesicles, and pelvic lymph nodes were initially treated to 45 Gy in 25 fractions of 1.8 Gy  2. The prostate only was boosted to 75 Gy with 15 additional fractions of 2.0 Gy   Beams/energy:  1. The prostate, seminal vesicles, and pelvic lymph nodes were initially treated using helical intensity modulated radiotherapy delivering 6 megavolt photons. Image guidance was performed with megavoltage CT studies prior to each fraction. He was immobilized with a body fix lower extremity mold.  2. the prostate only was boosted using helical intensity modulated radiotherapy delivering 6 megavolt photons. Image guidance was performed with megavoltage CT studies prior to each fraction. He was immobilized with a body fix lower extremity mold.  Narrative: The patient tolerated radiation treatment relatively well.   The patient experienced some minor urinary irritation and modest fatigue.    Plan: The patient has completed radiation treatment. He will return to radiation oncology clinic for routine followup in one month. I advised him to call or return sooner if he has any questions or concerns related to his recovery or treatment. ________________________________  Sheral Apley. Tammi Klippel, M.D.

## 2015-09-30 ENCOUNTER — Encounter: Payer: Self-pay | Admitting: Medical Oncology

## 2015-09-30 ENCOUNTER — Ambulatory Visit
Admission: RE | Admit: 2015-09-30 | Discharge: 2015-09-30 | Disposition: A | Discharge status: Home / Self Care | Payer: Medicare Other | Source: Ambulatory Visit | Attending: Radiation Oncology | Admitting: Radiation Oncology

## 2015-09-30 ENCOUNTER — Encounter: Payer: Self-pay | Admitting: Radiation Oncology

## 2015-09-30 VITALS — BP 148/62 | HR 58 | Resp 16 | Wt 165.1 lb

## 2015-09-30 DIAGNOSIS — C61 Malignant neoplasm of prostate: Secondary | ICD-10-CM | POA: Diagnosis not present

## 2015-09-30 NOTE — Progress Notes (Signed)
Radiation Oncology         [phone]) [phone] ________________________________  Name: [name] MRN: [id number]  Date: 09/30/2015  DOB: 1935-07-25  Follow-Up Visit Note  CC: [name], MD  Raynelle Bring, MD  Diagnosis: Stage T2, high risk adenocarcinoma of the prostate with a Gleason's score of 4+5 and a PSA of 2.21 (obtained when the patient had been on Finasteride).  Interval Since Last Radiation:   1 month  06/29/2015-08/26/2015: 1. The prostate, seminal vesicles, and pelvic lymph nodes were initially treated to 45 Gy in 25 fractions of 1.8 Gy  2. The prostate only was boosted to 75 Gy with 15 additional fractions of 2.0 Gy    Narrative:  The patient returns today for routine follow-up. He tolerated radiotherapy but did have some difficulty with diarrhea during treatment. He is scheduled to have Lupron injection next week. Denies having a PSA drawn since completion of treatment. Reports dysuria and pain with urination. Denies hematuria. Denies incontinence or leakage. Reports on rare occasion without warning he will experience a stabbing pain in his rectum. Describes a strong steady urine stream. Denies difficulty emptying his bladder or urinary hesitancy. Reports urinary frequency. He explains he voids approximately every hour. Reports his energy level is gradually improving.                               ALLERGIES:  has No Known Allergies.  Meds: Current Outpatient Prescriptions  Medication Sig Dispense Refill  . amLODipine (NORVASC) 5 MG tablet Take 2.5 mg by mouth every evening.     [name] aspirin 325 MG EC tablet Take 325 mg by mouth at bedtime.    . bismuth subsalicylate (PEPTO-BISMOL) 262 MG/15ML suspension Take 30 mLs by mouth daily. At night    . FLUoxetine (PROZAC) 20 MG capsule Take 20 mg by mouth at bedtime.     . Insulin Glargine (BASAGLAR KWIKPEN) 100 UNIT/ML Solostar Pen Inject 25 Units into the skin every evening.     . Melatonin 3 MG TABS Take 1 tablet by  mouth at bedtime.     . metFORMIN (GLUMETZA) 1000 MG (MOD) 24 hr tablet Take 1 tablet (1,000 mg total) by mouth 2 (two) times daily with a meal.    . metoprolol tartrate (LOPRESSOR) 25 MG tablet Take 12.5-25 mg by mouth 2 (two) times daily. 1/2 tablet in the morning, 1 tablet in the evening    . Multiple Vitamin (MULTIVITAMIN) tablet Take 1 tablet by mouth daily.    . nitroGLYCERIN (NITROSTAT) 0.4 MG SL tablet Place 1 tablet (0.4 mg total) under the tongue every 5 (five) minutes as needed. For chest pain 25 tablet 5  . omeprazole (PRILOSEC) 10 MG capsule Take 10 mg by mouth daily.    . rosuvastatin (CRESTOR) 10 MG tablet Take 10 mg by mouth every morning.     . vitamin C (ASCORBIC ACID) 500 MG tablet Take 500 mg by mouth 2 (two) times daily.      [name] zolpidem (AMBIEN) 10 MG tablet Take 10 mg by mouth at bedtime as needed for sleep.     No current facility-administered medications for this encounter.    Physical Findings:  weight is 165 lb 1.6 oz (74.889 kg). His blood pressure is 148/62 and his pulse is 58. His respiration is 16 and oxygen saturation is 100%.   In general this is a well appearing caucasian male in no acute distress. He's  alert and oriented x4 and appropriate throughout the examination. Cardiopulmonary assessment is negative for acute distress and he exhibits normal effort.   Lab Findings: Lab Results  Component Value Date   WBC 9.3 05/01/2015   HGB 10.2* 05/01/2015   HCT 31.0* 05/01/2015   PLT 305 05/01/2015    Lab Results  Component Value Date   NA 135 05/01/2015   K 4.4 05/01/2015   CO2 26 05/01/2015   GLUCOSE 336* 05/01/2015   BUN 18 05/01/2015   CREATININE 1.04 05/01/2015   BILITOT 0.5 05/09/2012   ALKPHOS 65 05/09/2012   AST 17 05/09/2012   ALT 14 05/09/2012   PROT 7.0 05/09/2012   ALBUMIN 3.7 05/09/2012   CALCIUM 9.2 05/01/2015   ANIONGAP 8 05/01/2015    Radiographic Findings: No results found.  Impression/Plan:   1. Stage T2, high risk  adenocarcinoma of the prostate with a Gleason's score of 4+5 and a PSA of 2.21 (obtained when the patient had been on Finasteride).The patient will follow up with his urologist appropriately for his next Lupron injection. He will continue to follow-up with urology for ongoing PSA determinations.  We will look forward to following his response through their correspondence, and be happy to participate in care if clinically indicated. We spoke with the patient about what to expect in the future, including his risk for erectile dysfunction and rectal bleeding.  We encouraged him to call or return to the office if he has any question about his previous radiation or possible radiation effects.  He was comfortable with this plan. 2. Survivorship. We discussed the role for survivorship in oncology care for patients of prostate  cancer. The patient is interested in a referral and we will coordinate this.      Carola Rhine, [facility] Seen with and dictated for Sheral Apley. Tammi Klippel, MD  This document serves as a record of services personally performed by Tyler Pita, MD. It was created on his behalf by Truddie Hidden, a trained medical scribe. The creation of this record is based on the scribe's personal observations and the provider's statements to them. This document has been checked and approved by the attending provider.

## 2015-09-30 NOTE — Progress Notes (Addendum)
Weight and vitals stable. Denies pain. Scheduled to have Lupron injection next week. Denies having a PSA drawn since completion of treatment. Reports dysuria and pain with urination. Denies hematuria. Denies incontinence or leakage. Reports on rare occasion without warning he will experience a stabbing pain in his rectum. Describes a strong steady urine stream. Denies difficulty emptying his bladder or urinary hesitancy. Reports urinary frequency. He explains he voids approximately every hour. Reports his energy level is gradually improving.   BP 148/62 mmHg  Pulse 58  Resp 16  Wt 165 lb 1.6 oz (74.889 kg)  SpO2 100% Wt Readings from Last 3 Encounters:  09/30/15 165 lb 1.6 oz (74.889 kg)  08/20/15 162 lb 8 oz (73.71 kg)  08/13/15 161 lb 12.8 oz (73.392 kg)

## 2015-10-01 ENCOUNTER — Telehealth: Payer: Self-pay | Admitting: *Deleted

## 2015-10-01 NOTE — Telephone Encounter (Signed)
CALLED PATIENT TO INFORM OF SURVIVORSHIP APPT. ON 10-29-15 @ 11:30 AM WITH GRETCHEN DAWSON, SPOKE WITH PATIENT AND HE IS AWARE OF THIS APPT.

## 2015-10-04 DIAGNOSIS — Z5111 Encounter for antineoplastic chemotherapy: Secondary | ICD-10-CM | POA: Diagnosis not present

## 2015-10-04 DIAGNOSIS — C61 Malignant neoplasm of prostate: Secondary | ICD-10-CM | POA: Diagnosis not present

## 2015-10-05 NOTE — Addendum Note (Signed)
Encounter addended by: Heywood Footman, RN on: 10/05/2015 11:04 AM<BR>     Documentation filed: Charges VN

## 2015-10-29 ENCOUNTER — Encounter: Payer: Medicare Other | Admitting: Adult Health

## 2015-11-18 DIAGNOSIS — I10 Essential (primary) hypertension: Secondary | ICD-10-CM | POA: Diagnosis not present

## 2015-11-18 DIAGNOSIS — R29898 Other symptoms and signs involving the musculoskeletal system: Secondary | ICD-10-CM | POA: Diagnosis not present

## 2015-11-18 DIAGNOSIS — F325 Major depressive disorder, single episode, in full remission: Secondary | ICD-10-CM | POA: Diagnosis not present

## 2015-11-18 DIAGNOSIS — D509 Iron deficiency anemia, unspecified: Secondary | ICD-10-CM | POA: Diagnosis not present

## 2015-11-18 DIAGNOSIS — K219 Gastro-esophageal reflux disease without esophagitis: Secondary | ICD-10-CM | POA: Diagnosis not present

## 2015-11-18 DIAGNOSIS — Z794 Long term (current) use of insulin: Secondary | ICD-10-CM | POA: Diagnosis not present

## 2015-11-18 DIAGNOSIS — E119 Type 2 diabetes mellitus without complications: Secondary | ICD-10-CM | POA: Diagnosis not present

## 2015-11-29 DIAGNOSIS — C61 Malignant neoplasm of prostate: Secondary | ICD-10-CM | POA: Diagnosis not present

## 2015-12-02 ENCOUNTER — Ambulatory Visit (HOSPITAL_BASED_OUTPATIENT_CLINIC_OR_DEPARTMENT_OTHER): Payer: Medicare Other | Admitting: Adult Health

## 2015-12-02 ENCOUNTER — Ambulatory Visit (HOSPITAL_BASED_OUTPATIENT_CLINIC_OR_DEPARTMENT_OTHER): Payer: Medicare Other

## 2015-12-02 ENCOUNTER — Encounter: Payer: Self-pay | Admitting: Adult Health

## 2015-12-02 ENCOUNTER — Telehealth: Payer: Self-pay | Admitting: Adult Health

## 2015-12-02 VITALS — BP 142/58 | HR 62 | Temp 98.4°F | Resp 18 | Ht 66.0 in | Wt 169.5 lb

## 2015-12-02 DIAGNOSIS — R300 Dysuria: Secondary | ICD-10-CM

## 2015-12-02 DIAGNOSIS — C61 Malignant neoplasm of prostate: Secondary | ICD-10-CM

## 2015-12-02 DIAGNOSIS — E291 Testicular hypofunction: Secondary | ICD-10-CM

## 2015-12-02 DIAGNOSIS — R350 Frequency of micturition: Secondary | ICD-10-CM | POA: Diagnosis not present

## 2015-12-02 LAB — URINALYSIS, MICROSCOPIC - CHCC
Bilirubin (Urine): NEGATIVE
GLUCOSE UR CHCC: 1000 mg/dL
Ketones: NEGATIVE mg/dL
LEUKOCYTE ESTERASE: NEGATIVE
Nitrite: NEGATIVE
PROTEIN: NEGATIVE mg/dL
Specific Gravity, Urine: 1.01 (ref 1.003–1.035)
UROBILINOGEN UR: 0.2 mg/dL (ref 0.2–1)
pH: 6.5 (ref 4.6–8.0)

## 2015-12-02 NOTE — Progress Notes (Addendum)
CLINIC:  Survivorship  REASON FOR VISIT:  Survivorship Care Plan visit & to address acute survivorship needs for recent history of prostate cancer.   BRIEF ONCOLOGY HISTORY:  Oncology History   Stage IIB (T2,N0,M0) adenocarcinoma of prostate Gleason 9 (4+5)  Initial PSA: 2.21 (on finasteride)     Prostate cancer ([place])   02/15/2015 Miscellaneous    Seen at  [facility] for urinary retention Louis Meckel) and elevated PSA 6. DRE revealed enlarged prostate with large nodule, approx 1 cm on right lobe. No nodule palpated on left lobe of prostate gland.       02/22/2015 Procedure    Prostate biopsy. 13/13 cores (+) for prostate adenoca. Gleason 9 (4+5) in 7 cores, PNI+ (mostly on left).  WHO Grade 5. Gleason 8 (4+4) in 6 cores (mostly on right).        02/22/2015 Initial Diagnosis    Prostate cancer ([place])      03/03/2015 Imaging    CT pelvis: Mildly prominent prostate gland; seminal vesicles without grossly abnormal soft tissue in periprostatic pelvic floor.  Small pelvic LNs bilat without pelvic lymphadenopathy by CT size criteria.       03/03/2015 Imaging    Bone scan: Punctate areas of mild increased activity in thoracic and lumbar spine, most likely degenerative. MRI spine can be done for further eval. No other focal significant bony abnormalities identified.       03/18/2015 Imaging    MRI lumbar/thoracic spine. No marrow signal abnormality to suggest osseous metastatic disease. Diffuse lumbar spine spondylosis. Interbody fusion at L4-5 without foraminal/central canal stenosis. Mild thoracic spine spondylosis.       04/06/2015 Miscellaneous    ADT with Lupron 45mg  injections started      04/26/2015 Surgery    TURP with Dr. Louis Meckel for bladder outlet obstruction ([facility]); path of prostate chips revealed prostate adenocarcinoma, Gleason 9 (4+5), involved 25% of tissue.       06/29/2015 - 08/26/2015 Radiation Therapy    Radiation therapy Tammi Klippel). The prostate, seminal  vesicles, and pelvic lymph nodes were initially treated to 45 Gy in 25 fractions of 1.8 Gy. The prostate only was boosted to 75 Gy with 15 additional fractions of 2.0 Gy       09/2015 Miscellaneous    ADT with Lupron      11/29/2015 Tumor Marker    Preliminary PSA post-treatment is <0.09        INTERVAL HISTORY:  Mr. Reggio reports to the [facility] for our initial meeting to review his survivorship care plan and recommendations for managing side effects of treatment.    He completed radiation therapy about 3 months ago.  He remains on androgen-deprivation therapy (ADT) with Lupron injections every 6 months; last injection was 09/2015 with Dr. Louis Meckel at [facility].    He has constant burning and pain with urination; also urinary frequency continues and is voiding about 1x/hour.  The pain occurs for more than half of his urinary stream, then it begins to burn at the end of his urinary stream.  Denies frank hematuria.  Denies diarrhea, blood in stools, or melena.  He is tired a lot, but feels like this is improving.  The muscles in his arms and legs feel weaker since starting the Lupron.  He feels like he has also gained some weight, particularly in his abdomen, since starting Lupron.  Endorses some worsening joint pain in his bilateral hips; this is not constant; hurts most with walking and relieved with rest.  He feels like his mood has been good; denies struggling with depression or anxiety.    ADDITIONAL REVIEW OF SYSTEMS:  Review of Systems  Constitutional: Positive for malaise/fatigue. Negative for fever.       Fatigue improving   HENT: Negative.   Eyes: Negative.   Respiratory: Negative.   Cardiovascular: Negative.   Gastrointestinal: Negative.  Negative for blood in stool, diarrhea and melena.  Genitourinary: Positive for dysuria and frequency. Negative for hematuria.  Musculoskeletal: Positive for joint pain.       Bilateral hips   Skin: Negative.    Neurological:       Endorses some peripheral neuropathy (likely unrelated to prostate cancer)   Endo/Heme/Allergies:       (+) hot flashes; manageable and not severe   Psychiatric/Behavioral: Negative.  Negative for depression. The patient is not nervous/anxious.     PAST MEDICAL & SURGICAL HISTORY:  Past Medical History:  Diagnosis Date  . Anemia   . Anginal pain ([place])   . Bladder outlet obstruction   . Carotid artery occlusion   . Coronary artery disease    post stents; prior CABG; s/p cath January 2014 with 2 VD, patent SVG to [facility] and patent SVG to PL patent  . Degenerative disk disease   . Foley catheter in place   . GERD (gastroesophageal reflux disease)   . Heart murmur   . History of esophageal stricture    "has it stretched q once in awhile" (03/26/2012"  . Hyperlipidemia   . Hypertension   . Kidney stones   . Major depression ([place])   . OSA (obstructive sleep apnea)    "have a mask; haven't used one in years" (03/26/2012)  . Prostate cancer ([place])   . Skin cancer of nose 2012   "right side" (03/26/2012)  . Skin cancer of trunk    "left chest" (03/26/2012)  . Spinal stenosis   . Stroke ([place]) 2014   NO RESIDUAL PROBLEMS  . Type II diabetes mellitus ([place])     Past Surgical History:  Procedure Laterality Date  . ADENOIDECTOMY  1956  . ANTERIOR CERVICAL DECOMP/DISCECTOMY FUSION  2000's  . AORTIC VALVE REPLACEMENT  08/29/2010   25 mm CE Magna bioprosthetic - [facility]  . APPENDECTOMY  1951  . CARDIAC CATHETERIZATION  10/20/2003   Est. EF of 65% -- Critical disease in the mid to distal circumflex --  Preserved left ventricular  function --   . CARDIAC VALVE REPLACEMENT    . CAROTID ENDARTERECTOMY  05/05/2003   right carotid endarterectomy  . CERVICAL DISC SURGERY  1980's  . CORONARY ANGIOPLASTY WITH STENT PLACEMENT  10/20/2003   Percutaneous coronary intervention/drug-eluding stent implantation, third marginal branch --  Percutaneous closure, right femoral artery --  Atherosclerotic coronary vascular disease, single vessel / Status post successful percutaneous coronary intervention/tandem drug -- eluding stent implantation proximal third marginal branch -- Typical angina was not reproduced with device insertion or balloon   . CORONARY ARTERY BYPASS GRAFT  08/29/2010   CABG X2; SVG to PDA, SVG to [facility].  [name] HEMATOMA EVACUATION  05/09/2003   Evacuation of hematoma, right neck  . LEFT HEART CATHETERIZATION WITH CORONARY ANGIOGRAM N/A 03/27/2012   Procedure: LEFT HEART CATHETERIZATION WITH CORONARY ANGIOGRAM;  Surgeon: Peter M Martinique, MD;  Location: [facility] CATH LAB;  Service: Cardiovascular;  Laterality: N/A;  . LUMBAR [place]  . MITRAL VALVE REPAIR  08/23/2010   28 mm Simulus ring -[facility]  . POSTERIOR FUSION  LUMBAR SPINE  1997  . PROSTATE BIOPSY    . SHOULDER ARTHROSCOPY  01/07/2007   Left shoulder impingement, labral tear  . SKIN CANCER EXCISION  1990's; 2012   "left chest & right nose" (03/26/2012)  . TEE WITHOUT CARDIOVERSION N/A 05/15/2012   Procedure: TRANSESOPHAGEAL ECHOCARDIOGRAM (TEE);  Surgeon: Thayer Headings, MD;  Location: [facility];  Service: Cardiovascular;  Laterality: N/A;  . [place]  . TRANSURETHRAL RESECTION OF PROSTATE N/A 04/26/2015   Procedure: TRANSURETHRAL RESECTION OF THE PROSTATE WITH GYRUS INSTRUMENTS;  Surgeon: Ardis Hughs, MD;  Location: [facility];  Service: Urology;  Laterality: N/A;  TURP-BIPOLAR     CURRENT MEDICATIONS:  Current Outpatient Prescriptions on File Prior to Visit  Medication Sig Dispense Refill  . amLODipine (NORVASC) 5 MG tablet Take 2.5 mg by mouth every evening.     [name] aspirin 325 MG EC tablet Take 325 mg by mouth at bedtime.    . bismuth subsalicylate (PEPTO-BISMOL) 262 MG/15ML suspension Take 30 mLs by mouth daily. At night    . FLUoxetine (PROZAC) 20 MG capsule Take 20 mg by mouth at bedtime.     . Insulin Glargine (BASAGLAR KWIKPEN) 100 UNIT/ML Solostar Pen  Inject 25 Units into the skin every evening.     . Melatonin 3 MG TABS Take 1 tablet by mouth at bedtime.     . metFORMIN (GLUMETZA) 1000 MG (MOD) 24 hr tablet Take 1 tablet (1,000 mg total) by mouth 2 (two) times daily with a meal.    . metoprolol tartrate (LOPRESSOR) 25 MG tablet Take 12.5-25 mg by mouth 2 (two) times daily. 1/2 tablet in the morning, 1 tablet in the evening    . Multiple Vitamin (MULTIVITAMIN) tablet Take 1 tablet by mouth daily.    . nitroGLYCERIN (NITROSTAT) 0.4 MG SL tablet Place 1 tablet (0.4 mg total) under the tongue every 5 (five) minutes as needed. For chest pain 25 tablet 5  . omeprazole (PRILOSEC) 10 MG capsule Take 10 mg by mouth daily.    . rosuvastatin (CRESTOR) 10 MG tablet Take 10 mg by mouth every morning.     . vitamin C (ASCORBIC ACID) 500 MG tablet Take 500 mg by mouth 2 (two) times daily.      [name] zolpidem (AMBIEN) 10 MG tablet Take 10 mg by mouth at bedtime as needed for sleep.     No current facility-administered medications on file prior to visit.     ALLERGIES:  No Known Allergies  SOCIAL HISTORY:   Mr. Rogalski is married and lives with his wife in [place], [place].  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  He has a significant history of cigarette smoking (115 pack year history); quit in 1980; currently denies smoking.  Does not drink alcohol. Denies illicit drug use.    PHYSICAL EXAM:  Vitals:  Vitals:   12/02/15 1004  BP: (!) 142/58  Pulse: 62  Resp: 18  Temp: 98.4 F (36.9 C)   Filed Weights   12/02/15 1004  Weight: 169 lb 8 oz (76.9 kg)   General: Well-nourished, well-appearing male in no acute distress.  Unaccompanied today.  HEENT: Head is normocephalic.  Pupils equal and reactive to light. Conjunctivae clear without exudate.  Sclerae anicteric. Oral mucosa pink and moist.  Lymph: No cervical, supraclavicular, or infraclavicular lymphadenopathy noted on palpation.   Respiratory: Clear to  auscultation bilaterally. Chest expansion symmetric; breathing non-labored.   GU: Deferred given  continued healing post-radiation therapy.  Neuro: No focal deficits. Steady gait.   Psych: Normal mood and affect for situation. Extremities: No edema. Skin: Warm and dry.   LABORATORY DATA:  None for this visit.   DIAGNOSTIC IMAGING:  None for this visit.    ASSESSMENT & PLAN:  Mr. Schlough is a pleasant 80 y.o. male with history of Stage IIB (T2N0M0) Gleason 9 (4+5) adenocarcinoma of the prostate; initial PSA 2.21 on finasteride; treated with external beam radiation therapy & ADT with initial Lupron injection in 09/2015; completed radiation therapy on 08/26/15.  Patient presents to survivorship clinic today for survivorship care plan visit and to address any acute survivorship concerns since completing treatment.   1. Stage IIB prostate cancer:  Mr. Bowling is continuing to recover from radiation therapy.  He will see Dr. Louis Meckel in 03/2016 for subsequent follow-up, PSA check, and next Lupron injection.  His most recent PSA was collected on 11/29/15 with preliminary results of <0.09; this is encouraging.  Mr. Vanderford would like Dr. Recardo Evangelist, PA to receive a copy of his PSA lab draws at [facility]. He completed a medical release form today to help facilitate that sharing of medical information.  He would like Dr. Tammi Klippel or Bryson Ha to call him after they receive the PSA results to ensure his levels are appropriate post-treatment. Today, he received a copy of his [facility]) document, which was reviewed with him in detail.  The SCP details his cancer treatment history and potential late/long-term side effects of those treatments.  We discussed the follow-up schedule he can anticipate with interval imaging for surveillance of his cancer.  I have also shared a copy of his treatment summary/SCP with his PCP.   2. Dysuria/Urinary frequency: Urinary tract infections after  completion radiation therapy for prostate cancer are certainly possible, but his dysuria is likely secondary to side effects of radiation rather than infection.  However, we will check a urinalysis with urine culture just to be sure.  He has not been using any Pyridium/AZO for the burning symptoms.  We will get the results of the urinalysis back and I will give him a call with recommendations.  He agreed with this plan.     3. Hot flashes secondary to ADT:  We reviewed the role that testosterone plays in males, as well as in prostate carcinogenesis; he understands the purpose of the Lupron injections as it relates to his cancer treatment.  He will likely need ADT for a total of 2 years given his high-risk disease.  Right now, his hot flashes are manageable without intervention. I explained to him that should the symptoms get worse, there are medications/strategies we can put into place to help manage his symptoms.    4. At risk of bone demineralization: Given his androgen-deprivation therapy, he is at risk for bone thinning.  We discussed the purpose of DEXA imaging in order to monitor for osteopenia or osteoporosis. I will defer bone mineral density assessment to the patient's urologist, Dr. Louis Meckel, as he is managing the patient's ADT.  In the meantime, I encouraged him to continue to engage in weight-bearing exercises and increase his consumption of calcium with vitamin D (either in his diet or as a supplement).  We reviewed that the ADT may be worsening some pre-existing arthritis, making some exercises difficult for him.  The [employer] program would be great for him because they would work with his physical limitations and help him modify the workouts as needed.  5. Physical activity/Healthy eating: Getting adequate physical activity and maintaining a healthy diet as a cancer survivor is important for overall wellness and reduces the risk of cancer recurrence. He is concerned about his weight gain  since starting ADT. I encouraged him to continue to eat a healthy diet and exercise.  We discussed the [facility], which is a fitness program that is offered to cancer survivors free of charge.  We also reviewed "The Nutrition Rainbow" handout, as well as the American Cancer Society's booklet with recommendations for reducing cancer risk. He was given a Engineer, civil (consulting) with instructions on how to get enrolled in this program if he chooses to do so.   6. Health promotion/Cancer screening:  Mr. Yoffe was given the national recommendations for colonoscopy, skin screenings, and vaccinations.  I encouraged him to talk with his PCP about arranging appropriate cancer screening tests, as clinically indicated.  7. Support services/Counseling: It is not uncommon for this period of the patient's cancer care trajectory to be one of many emotions and stressors.  Mr. Salata was encouraged to take advantage of our many support services programs, support groups, and/or counseling in coping with her new life as a cancer survivor after completing anti-cancer treatment. I specifically highlighted for him when the Prostate Cancer Support Group is held here at the cancer center.  The fellowship of other survivors/caregivers in group therapy facilitated by our prostate cancer support group leader could be helpful for both the patient and his caregiver.   He was given a calendar of the cancer center's support services events, as well as brochures for our spiritual care and free counseling resources.     Dispo:  -Urinalysis and urine culture today for dysuria; will call patient with results.   -Follow-up with Dr. Louis Meckel at [facility] as directed; continue Lupron injections.  -Return to survivorship clinic as needed; no additional follow-up needed at this time.   -Consider transitioning the patient to long-term survivorship, when clinically appropriate.    A total of 60 minutes was spent in  face-to-face care of this patient, with greater than 50% of that time spent in counseling and care coordination.   Mike Craze, NP [name] [phone]

## 2015-12-02 NOTE — Telephone Encounter (Signed)
I attempted to reach Mr. Zentner at home to share with him the results of his urinalysis collected today after our visit.  I was unable to speak with him directly, but was able to speak with his wife.   I let her know that the UA did not show infection, which is great.  So, I encouraged them to pick some AZO over-the-counter Phenazopyridine, to help with his dysuria.  Likely this is a side effect of radiation that will improve with time.  Encouraged them to call me with any questions or concerns.  I left my direct office number with the patient's wife should they have questions.   She voiced understanding of this plan.   Mike Craze, NP [place] [phone]

## 2015-12-03 ENCOUNTER — Telehealth: Payer: Self-pay | Admitting: *Deleted

## 2015-12-03 NOTE — Telephone Encounter (Signed)
Elzie Rings brought a signed release to me to obtain records from [facility].  Faxed request.

## 2015-12-04 LAB — URINE CULTURE

## 2015-12-06 DIAGNOSIS — N3281 Overactive bladder: Secondary | ICD-10-CM | POA: Diagnosis not present

## 2015-12-06 DIAGNOSIS — Z8546 Personal history of malignant neoplasm of prostate: Secondary | ICD-10-CM | POA: Diagnosis not present

## 2015-12-07 ENCOUNTER — Telehealth: Payer: Self-pay | Admitting: *Deleted

## 2015-12-07 NOTE — Telephone Encounter (Signed)
Received requested records from [facility].  Took records to HIM to scan.

## 2015-12-08 ENCOUNTER — Telehealth: Payer: Self-pay | Admitting: Adult Health

## 2015-12-08 NOTE — Telephone Encounter (Signed)
I called to follow-up with [name] regarding his dysuria symptoms.  He still has burning with urination. He tells me that he was able to see Dr. Louis Meckel in the interim, who gave him some samples of a medication to try.  He is not sure of the name of the medication, but states he will try it and if it doesn't work, then he will try the AZO over-the-counter.  I shared with him that we went off his medical record release of information to [facility] and that I sent a message to Dr. Tammi Klippel and his PA, Bryson Ha, about his request for wanting to be contacted with their recommendations regarding his PSA values when they become available.  Encouraged him to call me with any other questions or concerns.  He voiced understanding and appreciation for the call.   Mike Craze, NP [place] [phone]

## 2015-12-09 ENCOUNTER — Telehealth: Payer: Self-pay | Admitting: Radiation Oncology

## 2015-12-09 NOTE — Telephone Encounter (Signed)
LM for pt to return my call

## 2015-12-15 DIAGNOSIS — Z23 Encounter for immunization: Secondary | ICD-10-CM | POA: Diagnosis not present

## 2016-01-03 ENCOUNTER — Encounter: Payer: Self-pay | Admitting: Medical Oncology

## 2016-01-03 DIAGNOSIS — Z9889 Other specified postprocedural states: Secondary | ICD-10-CM | POA: Diagnosis not present

## 2016-01-03 DIAGNOSIS — I6340 Cerebral infarction due to embolism of unspecified cerebral artery: Secondary | ICD-10-CM | POA: Diagnosis not present

## 2016-01-03 DIAGNOSIS — I2510 Atherosclerotic heart disease of native coronary artery without angina pectoris: Secondary | ICD-10-CM | POA: Diagnosis not present

## 2016-01-03 DIAGNOSIS — I10 Essential (primary) hypertension: Secondary | ICD-10-CM | POA: Diagnosis not present

## 2016-01-03 DIAGNOSIS — I2584 Coronary atherosclerosis due to calcified coronary lesion: Secondary | ICD-10-CM | POA: Diagnosis not present

## 2016-01-03 DIAGNOSIS — Z951 Presence of aortocoronary bypass graft: Secondary | ICD-10-CM | POA: Diagnosis not present

## 2016-01-03 DIAGNOSIS — Z952 Presence of prosthetic heart valve: Secondary | ICD-10-CM | POA: Diagnosis not present

## 2016-01-03 DIAGNOSIS — I371 Nonrheumatic pulmonary valve insufficiency: Secondary | ICD-10-CM | POA: Diagnosis not present

## 2016-01-03 NOTE — Progress Notes (Signed)
Left message as follow up 3 month post radiation treatments. Asked for a return call with update.

## 2016-03-06 DIAGNOSIS — Z8546 Personal history of malignant neoplasm of prostate: Secondary | ICD-10-CM | POA: Diagnosis not present

## 2016-04-03 DIAGNOSIS — M25552 Pain in left hip: Secondary | ICD-10-CM | POA: Diagnosis not present

## 2016-04-03 DIAGNOSIS — R42 Dizziness and giddiness: Secondary | ICD-10-CM | POA: Diagnosis not present

## 2016-04-03 DIAGNOSIS — E1122 Type 2 diabetes mellitus with diabetic chronic kidney disease: Secondary | ICD-10-CM | POA: Diagnosis not present

## 2016-04-03 DIAGNOSIS — E1142 Type 2 diabetes mellitus with diabetic polyneuropathy: Secondary | ICD-10-CM | POA: Diagnosis not present

## 2016-04-03 DIAGNOSIS — Z794 Long term (current) use of insulin: Secondary | ICD-10-CM | POA: Diagnosis not present

## 2016-04-03 DIAGNOSIS — N182 Chronic kidney disease, stage 2 (mild): Secondary | ICD-10-CM | POA: Diagnosis not present

## 2016-04-03 DIAGNOSIS — D509 Iron deficiency anemia, unspecified: Secondary | ICD-10-CM | POA: Diagnosis not present

## 2016-04-03 DIAGNOSIS — M25551 Pain in right hip: Secondary | ICD-10-CM | POA: Diagnosis not present

## 2016-04-03 DIAGNOSIS — I10 Essential (primary) hypertension: Secondary | ICD-10-CM | POA: Diagnosis not present

## 2016-04-04 ENCOUNTER — Other Ambulatory Visit: Payer: Self-pay | Admitting: Internal Medicine

## 2016-04-04 DIAGNOSIS — R42 Dizziness and giddiness: Secondary | ICD-10-CM

## 2016-04-10 DIAGNOSIS — Z5111 Encounter for antineoplastic chemotherapy: Secondary | ICD-10-CM | POA: Diagnosis not present

## 2016-04-10 DIAGNOSIS — C61 Malignant neoplasm of prostate: Secondary | ICD-10-CM | POA: Diagnosis not present

## 2016-04-12 ENCOUNTER — Ambulatory Visit
Admission: RE | Admit: 2016-04-12 | Discharge: 2016-04-12 | Disposition: A | Discharge status: Home / Self Care | Payer: Medicare Other | Source: Ambulatory Visit | Attending: Internal Medicine | Admitting: Internal Medicine

## 2016-04-12 DIAGNOSIS — R42 Dizziness and giddiness: Secondary | ICD-10-CM

## 2016-04-26 DIAGNOSIS — H1859 Other hereditary corneal dystrophies: Secondary | ICD-10-CM | POA: Diagnosis not present

## 2016-04-26 DIAGNOSIS — H35033 Hypertensive retinopathy, bilateral: Secondary | ICD-10-CM | POA: Diagnosis not present

## 2016-04-26 DIAGNOSIS — E119 Type 2 diabetes mellitus without complications: Secondary | ICD-10-CM | POA: Diagnosis not present

## 2016-04-26 DIAGNOSIS — I7090 Unspecified atherosclerosis: Secondary | ICD-10-CM | POA: Diagnosis not present

## 2016-04-28 ENCOUNTER — Ambulatory Visit (INDEPENDENT_AMBULATORY_CARE_PROVIDER_SITE_OTHER): Payer: Medicare Other | Admitting: Cardiovascular Disease

## 2016-04-28 ENCOUNTER — Encounter: Payer: Self-pay | Admitting: Cardiovascular Disease

## 2016-04-28 VITALS — BP 118/52 | HR 68 | Ht 66.0 in | Wt 170.6 lb

## 2016-04-28 DIAGNOSIS — I739 Peripheral vascular disease, unspecified: Secondary | ICD-10-CM

## 2016-04-28 NOTE — Addendum Note (Signed)
Addended by: Therisa Doyne on: 04/28/2016 02:25 PM   Modules accepted: Orders

## 2016-04-28 NOTE — Progress Notes (Addendum)
04/28/2016 [name]   [birth year]  [id number]  Primary Physician [name], MD Primary Cardiologist: Lorretta Harp MD Renae Gloss  HPI:  Mr. 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 He was referred by Dr. Lavone Orn for peripheral vascular evaluation because of symptomatic claudication. He stopped smoking 10/01/78. History of hypertension, diabetes and hyperlipidemia as well as family history. He had coronary artery bypass grafting times 08/20/2010 at [facility] with aortic valve replacement using a bioprosthesis as well as mitral valve repair. He had right carotid endarterectomy 2005. He also has prostate cancer status post radiation therapy. He's noticed bilateral lower extremity claudication which is symmetric over the last year which is progressive and lifestyle limiting.   Current Outpatient Prescriptions  Medication Sig Dispense Refill  . amLODipine (NORVASC) 5 MG tablet Take 2.5 mg by mouth every evening.     [name] aspirin 325 MG EC tablet Take 325 mg by mouth at bedtime.    [name] FLUoxetine (PROZAC) 20 MG capsule Take 20 mg by mouth at bedtime.     . Insulin Glargine (BASAGLAR KWIKPEN) 100 UNIT/ML SOPN Inject 25 Units into the skin every evening.     . Melatonin 3 MG TABS Take 1 tablet by mouth at bedtime.     . metFORMIN (GLUMETZA) 1000 MG (MOD) 24 hr tablet Take 1 tablet (1,000 mg total) by mouth 2 (two) times daily with a meal.    . metoprolol tartrate (LOPRESSOR) 25 MG tablet Take 12.5-25 mg by mouth 2 (two) times daily. 1/2 tablet in the morning, 1 tablet in the evening    . Multiple Vitamin (MULTIVITAMIN) tablet Take 1 tablet by mouth daily.    . nitroGLYCERIN (NITROSTAT) 0.4 MG SL tablet Place 1 tablet (0.4 mg total) under the tongue every 5 (five) minutes as needed. For chest pain 25 tablet 5  . omeprazole (PRILOSEC OTC) 20 MG tablet Take 1 tab daily by  mouth    . rosuvastatin (CRESTOR) 10 MG tablet Take 10 mg by mouth every morning.     [name] UNABLE TO FIND Med Name: Estrogen shot every 3-6 months for 2 years    . vitamin C (ASCORBIC ACID) 500 MG tablet Take 500 mg by mouth 2 (two) times daily.      [name] zolpidem (AMBIEN) 10 MG tablet Take 10 mg by mouth at bedtime as needed for sleep.     No current facility-administered medications for this visit.     No Known Allergies  Social History   Social History  . Marital status: Married    Spouse name: N/A  . Number of children: N/A  . Years of education: N/A   Occupational History  . Not on file.   Social History Main Topics  . Smoking status: Former Smoker    Packs/day: 5.00    Years: 23.00    Types: Cigarettes    Quit date: 10/01/1978  . Smokeless tobacco: Never Used  . Alcohol use No     Comment: 03/26/2012 "last alcohol was in 1970; never had problem w/it"  . Drug use: No  . Sexual activity: No   Other Topics Concern  . Not on file   Social History Narrative  . No narrative on file     Review of Systems: General: negative for chills, fever, night sweats or weight changes.  Cardiovascular: negative for chest pain, dyspnea on exertion, edema,  orthopnea, palpitations, paroxysmal nocturnal dyspnea or shortness of breath Dermatological: negative for rash Respiratory: negative for cough or wheezing Urologic: negative for hematuria Abdominal: negative for nausea, vomiting, diarrhea, bright red blood per rectum, melena, or hematemesis Neurologic: negative for visual changes, syncope, or dizziness All other systems reviewed and are otherwise negative except as noted above.    Blood pressure (!) 118/52, pulse 68, height 5\' 6"  (1.676 m), weight 170 lb 9.6 oz (77.4 kg).  General appearance: alert and no distress Neck: no adenopathy, no JVD, supple, symmetrical, trachea midline, thyroid not enlarged, symmetric, no tenderness/mass/nodules and Soft right carotid bruit Lungs: clear to  auscultation bilaterally Heart: Regular rate and rhythm rhythm with a soft diastolic blow consistent with aortic insufficiency Extremities: extremities normal, atraumatic, no cyanosis or edema and 2+ femoral arteries without bruits, 2+ pedal artery pulses  EKG normal sinus rhythm at 68 without ST or T-wave changes. I personally reviewed this EKG  ASSESSMENT AND PLAN:   Peripheral arterial disease [facility]) Mr. Fingar is a 81 year old gentleman with a history of coronary artery disease status post remote bypass grafting and valvular heart disease as well with positive risk factors referred to me by Dr. Laurann Montana for evaluation of symptomatic PAD. Over the last year he's had claudication there is symmetric bilaterally at less than 1-2 blocks. He was a runner in the past and a walker and now cannot walk very far without being symptomatic. He does have palpable pedal pulses and femoral pulses. Lower extremity arterial Doppler studies to further evaluate his peripheral vascular anatomy. Should these suggest high-grade disease with decided to proceed with angiography and potential intervention.      Lorretta Harp MD FACP,FACC,FAHA, [facility] 04/28/2016 2:02 PM

## 2016-04-28 NOTE — Assessment & Plan Note (Signed)
Mr. Arcidiacono is a 81 year old gentleman with a history of coronary artery disease status post remote bypass grafting and valvular heart disease as well with positive risk factors referred to me by Dr. Laurann Montana for evaluation of symptomatic PAD. Over the last year he's had claudication there is symmetric bilaterally at less than 1-2 blocks. He was a runner in the past and a walker and now cannot walk very far without being symptomatic. He does have palpable pedal pulses and femoral pulses. Lower extremity arterial Doppler studies to further evaluate his peripheral vascular anatomy. Should these suggest high-grade disease with decided to proceed with angiography and potential intervention.

## 2016-04-28 NOTE — Patient Instructions (Signed)
Medication Instructions: Your physician recommends that you continue on your current medications as directed. Please refer to the Current Medication list given to you today.   Testing/Procedures: Your physician has requested that you have a lower extremity arterial duplex. During this test, ultrasound is used to evaluate arterial blood flow in the legs. Allow one hour for this exam. There are no restrictions or special instructions.  Your physician has requested that you have an ankle brachial index (ABI). During this test an ultrasound and blood pressure cuff are used to evaluate the arteries that supply the arms and legs with blood. Allow thirty minutes for this exam. There are no restrictions or special instructions.  Follow-Up: Your physician recommends that you schedule a follow-up appointment as needed with Dr. Berry.  If you need a refill on your cardiac medications before your next appointment, please call your pharmacy.  

## 2016-05-22 ENCOUNTER — Other Ambulatory Visit: Payer: Self-pay | Admitting: Cardiovascular Disease

## 2016-05-22 DIAGNOSIS — I739 Peripheral vascular disease, unspecified: Secondary | ICD-10-CM

## 2016-05-23 ENCOUNTER — Ambulatory Visit (HOSPITAL_COMMUNITY)
Admission: RE | Admit: 2016-05-23 | Discharge: 2016-05-23 | Disposition: A | Discharge status: Home / Self Care | Payer: Medicare Other | Source: Ambulatory Visit | Attending: Cardiovascular Disease | Admitting: Cardiovascular Disease

## 2016-05-23 ENCOUNTER — Encounter (HOSPITAL_COMMUNITY): Payer: Medicare Other

## 2016-05-23 DIAGNOSIS — I739 Peripheral vascular disease, unspecified: Secondary | ICD-10-CM | POA: Insufficient documentation

## 2016-05-26 DIAGNOSIS — C44319 Basal cell carcinoma of skin of other parts of face: Secondary | ICD-10-CM | POA: Diagnosis not present

## 2016-05-26 DIAGNOSIS — D1801 Hemangioma of skin and subcutaneous tissue: Secondary | ICD-10-CM | POA: Diagnosis not present

## 2016-05-26 DIAGNOSIS — C4441 Basal cell carcinoma of skin of scalp and neck: Secondary | ICD-10-CM | POA: Diagnosis not present

## 2016-05-26 DIAGNOSIS — D485 Neoplasm of uncertain behavior of skin: Secondary | ICD-10-CM | POA: Diagnosis not present

## 2016-05-26 DIAGNOSIS — L821 Other seborrheic keratosis: Secondary | ICD-10-CM | POA: Diagnosis not present

## 2016-05-26 DIAGNOSIS — D225 Melanocytic nevi of trunk: Secondary | ICD-10-CM | POA: Diagnosis not present

## 2016-05-26 DIAGNOSIS — Z85828 Personal history of other malignant neoplasm of skin: Secondary | ICD-10-CM | POA: Diagnosis not present

## 2016-05-26 DIAGNOSIS — L570 Actinic keratosis: Secondary | ICD-10-CM | POA: Diagnosis not present

## 2016-05-26 DIAGNOSIS — L814 Other melanin hyperpigmentation: Secondary | ICD-10-CM | POA: Diagnosis not present

## 2016-05-29 DIAGNOSIS — Z8546 Personal history of malignant neoplasm of prostate: Secondary | ICD-10-CM | POA: Diagnosis not present

## 2016-06-05 DIAGNOSIS — Z8546 Personal history of malignant neoplasm of prostate: Secondary | ICD-10-CM | POA: Diagnosis not present

## 2016-07-03 DIAGNOSIS — I081 Rheumatic disorders of both mitral and tricuspid valves: Secondary | ICD-10-CM | POA: Diagnosis not present

## 2016-07-03 DIAGNOSIS — I340 Nonrheumatic mitral (valve) insufficiency: Secondary | ICD-10-CM | POA: Diagnosis not present

## 2016-07-03 DIAGNOSIS — Z87891 Personal history of nicotine dependence: Secondary | ICD-10-CM | POA: Diagnosis not present

## 2016-07-03 DIAGNOSIS — I6340 Cerebral infarction due to embolism of unspecified cerebral artery: Secondary | ICD-10-CM | POA: Diagnosis not present

## 2016-07-03 DIAGNOSIS — I371 Nonrheumatic pulmonary valve insufficiency: Secondary | ICD-10-CM | POA: Diagnosis not present

## 2016-07-03 DIAGNOSIS — I361 Nonrheumatic tricuspid (valve) insufficiency: Secondary | ICD-10-CM | POA: Diagnosis not present

## 2016-07-03 DIAGNOSIS — R931 Abnormal findings on diagnostic imaging of heart and coronary circulation: Secondary | ICD-10-CM | POA: Diagnosis not present

## 2016-07-03 DIAGNOSIS — E119 Type 2 diabetes mellitus without complications: Secondary | ICD-10-CM | POA: Diagnosis not present

## 2016-07-03 DIAGNOSIS — I10 Essential (primary) hypertension: Secondary | ICD-10-CM | POA: Diagnosis not present

## 2016-07-03 DIAGNOSIS — Z794 Long term (current) use of insulin: Secondary | ICD-10-CM | POA: Diagnosis not present

## 2016-07-03 DIAGNOSIS — Z953 Presence of xenogenic heart valve: Secondary | ICD-10-CM | POA: Diagnosis not present

## 2016-07-03 DIAGNOSIS — Z951 Presence of aortocoronary bypass graft: Secondary | ICD-10-CM | POA: Diagnosis not present

## 2016-07-03 DIAGNOSIS — Z8673 Personal history of transient ischemic attack (TIA), and cerebral infarction without residual deficits: Secondary | ICD-10-CM | POA: Diagnosis not present

## 2016-07-03 DIAGNOSIS — Z952 Presence of prosthetic heart valve: Secondary | ICD-10-CM | POA: Diagnosis not present

## 2016-07-03 DIAGNOSIS — I517 Cardiomegaly: Secondary | ICD-10-CM | POA: Diagnosis not present

## 2016-07-03 DIAGNOSIS — I2584 Coronary atherosclerosis due to calcified coronary lesion: Secondary | ICD-10-CM | POA: Diagnosis not present

## 2016-07-03 DIAGNOSIS — Z9889 Other specified postprocedural states: Secondary | ICD-10-CM | POA: Diagnosis not present

## 2016-07-03 DIAGNOSIS — I2510 Atherosclerotic heart disease of native coronary artery without angina pectoris: Secondary | ICD-10-CM | POA: Diagnosis not present

## 2016-07-03 DIAGNOSIS — E785 Hyperlipidemia, unspecified: Secondary | ICD-10-CM | POA: Diagnosis not present

## 2016-07-04 DIAGNOSIS — Z85828 Personal history of other malignant neoplasm of skin: Secondary | ICD-10-CM | POA: Diagnosis not present

## 2016-07-04 DIAGNOSIS — C44319 Basal cell carcinoma of skin of other parts of face: Secondary | ICD-10-CM | POA: Diagnosis not present

## 2016-07-12 DIAGNOSIS — Z85828 Personal history of other malignant neoplasm of skin: Secondary | ICD-10-CM | POA: Diagnosis not present

## 2016-07-12 DIAGNOSIS — C4441 Basal cell carcinoma of skin of scalp and neck: Secondary | ICD-10-CM | POA: Diagnosis not present

## 2016-07-17 DIAGNOSIS — I10 Essential (primary) hypertension: Secondary | ICD-10-CM | POA: Diagnosis not present

## 2016-07-17 DIAGNOSIS — E1165 Type 2 diabetes mellitus with hyperglycemia: Secondary | ICD-10-CM | POA: Diagnosis not present

## 2016-07-17 DIAGNOSIS — Z0000 Encounter for general adult medical examination without abnormal findings: Secondary | ICD-10-CM | POA: Diagnosis not present

## 2016-07-17 DIAGNOSIS — D508 Other iron deficiency anemias: Secondary | ICD-10-CM | POA: Diagnosis not present

## 2016-07-17 DIAGNOSIS — Z794 Long term (current) use of insulin: Secondary | ICD-10-CM | POA: Diagnosis not present

## 2016-07-17 DIAGNOSIS — E1122 Type 2 diabetes mellitus with diabetic chronic kidney disease: Secondary | ICD-10-CM | POA: Diagnosis not present

## 2016-07-17 DIAGNOSIS — E7800 Pure hypercholesterolemia, unspecified: Secondary | ICD-10-CM | POA: Diagnosis not present

## 2016-07-17 DIAGNOSIS — D509 Iron deficiency anemia, unspecified: Secondary | ICD-10-CM | POA: Diagnosis not present

## 2016-07-17 DIAGNOSIS — E1142 Type 2 diabetes mellitus with diabetic polyneuropathy: Secondary | ICD-10-CM | POA: Diagnosis not present

## 2016-07-17 DIAGNOSIS — F325 Major depressive disorder, single episode, in full remission: Secondary | ICD-10-CM | POA: Diagnosis not present

## 2016-07-17 DIAGNOSIS — F5101 Primary insomnia: Secondary | ICD-10-CM | POA: Diagnosis not present

## 2016-07-17 DIAGNOSIS — I779 Disorder of arteries and arterioles, unspecified: Secondary | ICD-10-CM | POA: Diagnosis not present

## 2016-07-17 DIAGNOSIS — I2510 Atherosclerotic heart disease of native coronary artery without angina pectoris: Secondary | ICD-10-CM | POA: Diagnosis not present

## 2016-07-17 DIAGNOSIS — N182 Chronic kidney disease, stage 2 (mild): Secondary | ICD-10-CM | POA: Diagnosis not present

## 2016-10-09 DIAGNOSIS — Z5111 Encounter for antineoplastic chemotherapy: Secondary | ICD-10-CM | POA: Diagnosis not present

## 2016-10-09 DIAGNOSIS — C61 Malignant neoplasm of prostate: Secondary | ICD-10-CM | POA: Diagnosis not present

## 2016-10-23 DIAGNOSIS — H1859 Other hereditary corneal dystrophies: Secondary | ICD-10-CM | POA: Diagnosis not present

## 2016-10-23 DIAGNOSIS — H04123 Dry eye syndrome of bilateral lacrimal glands: Secondary | ICD-10-CM | POA: Diagnosis not present

## 2016-11-06 DIAGNOSIS — I351 Nonrheumatic aortic (valve) insufficiency: Secondary | ICD-10-CM | POA: Diagnosis not present

## 2016-11-06 DIAGNOSIS — J209 Acute bronchitis, unspecified: Secondary | ICD-10-CM | POA: Diagnosis not present

## 2016-11-08 DIAGNOSIS — E1122 Type 2 diabetes mellitus with diabetic chronic kidney disease: Secondary | ICD-10-CM | POA: Diagnosis not present

## 2016-11-08 DIAGNOSIS — N182 Chronic kidney disease, stage 2 (mild): Secondary | ICD-10-CM | POA: Diagnosis not present

## 2016-11-08 DIAGNOSIS — N400 Enlarged prostate without lower urinary tract symptoms: Secondary | ICD-10-CM | POA: Diagnosis not present

## 2016-11-08 DIAGNOSIS — F325 Major depressive disorder, single episode, in full remission: Secondary | ICD-10-CM | POA: Diagnosis not present

## 2016-11-08 DIAGNOSIS — D509 Iron deficiency anemia, unspecified: Secondary | ICD-10-CM | POA: Diagnosis not present

## 2016-11-08 DIAGNOSIS — I2510 Atherosclerotic heart disease of native coronary artery without angina pectoris: Secondary | ICD-10-CM | POA: Diagnosis not present

## 2016-11-08 DIAGNOSIS — C61 Malignant neoplasm of prostate: Secondary | ICD-10-CM | POA: Diagnosis not present

## 2016-11-08 DIAGNOSIS — I10 Essential (primary) hypertension: Secondary | ICD-10-CM | POA: Diagnosis not present

## 2016-11-08 DIAGNOSIS — E1142 Type 2 diabetes mellitus with diabetic polyneuropathy: Secondary | ICD-10-CM | POA: Diagnosis not present

## 2016-11-13 DIAGNOSIS — R079 Chest pain, unspecified: Secondary | ICD-10-CM | POA: Diagnosis not present

## 2016-11-13 DIAGNOSIS — I4892 Unspecified atrial flutter: Secondary | ICD-10-CM | POA: Diagnosis not present

## 2016-11-15 ENCOUNTER — Ambulatory Visit (HOSPITAL_COMMUNITY)
Admission: RE | Admit: 2016-11-15 | Discharge: 2016-11-15 | Disposition: A | Discharge status: Home / Self Care | Payer: Medicare Other | Source: Ambulatory Visit | Attending: Nurse Practitioner | Admitting: Nurse Practitioner

## 2016-11-15 ENCOUNTER — Encounter (HOSPITAL_COMMUNITY): Payer: Self-pay | Admitting: Nurse Practitioner

## 2016-11-15 VITALS — BP 132/84 | HR 114 | Ht 66.0 in | Wt 173.0 lb

## 2016-11-15 DIAGNOSIS — Z85828 Personal history of other malignant neoplasm of skin: Secondary | ICD-10-CM | POA: Insufficient documentation

## 2016-11-15 DIAGNOSIS — Z833 Family history of diabetes mellitus: Secondary | ICD-10-CM | POA: Insufficient documentation

## 2016-11-15 DIAGNOSIS — Z9889 Other specified postprocedural states: Secondary | ICD-10-CM | POA: Insufficient documentation

## 2016-11-15 DIAGNOSIS — I483 Typical atrial flutter: Secondary | ICD-10-CM | POA: Diagnosis not present

## 2016-11-15 DIAGNOSIS — Z8042 Family history of malignant neoplasm of prostate: Secondary | ICD-10-CM | POA: Insufficient documentation

## 2016-11-15 DIAGNOSIS — Z79899 Other long term (current) drug therapy: Secondary | ICD-10-CM | POA: Diagnosis not present

## 2016-11-15 DIAGNOSIS — F329 Major depressive disorder, single episode, unspecified: Secondary | ICD-10-CM | POA: Diagnosis not present

## 2016-11-15 DIAGNOSIS — Z794 Long term (current) use of insulin: Secondary | ICD-10-CM | POA: Diagnosis not present

## 2016-11-15 DIAGNOSIS — Z87891 Personal history of nicotine dependence: Secondary | ICD-10-CM | POA: Diagnosis not present

## 2016-11-15 DIAGNOSIS — Z808 Family history of malignant neoplasm of other organs or systems: Secondary | ICD-10-CM | POA: Insufficient documentation

## 2016-11-15 DIAGNOSIS — E119 Type 2 diabetes mellitus without complications: Secondary | ICD-10-CM | POA: Diagnosis not present

## 2016-11-15 DIAGNOSIS — Z8249 Family history of ischemic heart disease and other diseases of the circulatory system: Secondary | ICD-10-CM | POA: Diagnosis not present

## 2016-11-15 DIAGNOSIS — Z8673 Personal history of transient ischemic attack (TIA), and cerebral infarction without residual deficits: Secondary | ICD-10-CM | POA: Diagnosis not present

## 2016-11-15 MED ORDER — APIXABAN 5 MG PO TABS: 5.0000 mg | ORAL_TABLET | Freq: Two times a day (BID) | ORAL | 0 refills | Status: DC

## 2016-11-15 MED ORDER — METOPROLOL TARTRATE 50 MG PO TABS: 75.0000 mg | ORAL_TABLET | Freq: Two times a day (BID) | ORAL | 2 refills | Status: DC

## 2016-11-15 MED ORDER — METOPROLOL TARTRATE 50 MG PO TABS: 75.0000 mg | ORAL_TABLET | Freq: Two times a day (BID) | ORAL | Status: DC

## 2016-11-15 NOTE — Progress Notes (Addendum)
Primary Care Physician: Lavone Orn, MD Referring Physician: Dr. Felipa Eth Cardiologist: Dr. Tana Felts at [facility]( wanting to change his care to [facility] for convenience issues)   [name] is a 81 y.o. male with past cardiac care at [facility] presenting to his PCP on Monday for fatigue and persistent shortness of breath. He was seen a couple of weeks ago with PCP and was treated for bronchitis. He was found to be in atrial flutter on Monday. He reports that since June he has not felt like his usual self with weakness in his legs with   fatigue and  increase shortness of breath with activities. PCP started  him on eliquis 5 mg bid for chadsvasc score of at least 6. He also increased metoprolol to 50 mg bid from 25 mg am and 50 mg pm.  He is currently in the afib clinic for f/u of new onset typical atrial flutter. EKG shows v rate of 114 bpm. He feels about the same as from the last several weeks. Does not describe any PND, orthopnea. Weight is up a couple of pounds. Has not noted any increase in ankle edema or abdominal fullness. He is s/p prostate ca treatment, receiving Lupron shots. H/o OSA, intolerant of cpap.  Review of Dr. Laroy Apple last note in April of this year(Epic care everywhere)shows h/o coronary disease status post PCI in 2005 to the circumflex then subsequently 2-vessel cabg with a vein graft to the PDA and a vein graft to OM along with a 28 mm mitral ring annuloplasty and a 25 mm CE [place] magna bioprosthetic aortic valve replacement in June 2012, moderate pulmonary valve regurgitation, prior right carotid endarterectomy, diabetes mellitus, hypertension, hyperlipidemia, and an ischemic stroke in 2014. 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 The decision was made with Dr. Sheila Oats and neurologist  to stop his warfarin based on the risk/ benefit analysis favoring potential for increased intracranial  hemorrhage and he was switched to aspirin alone. He was started on warfarin at time of stroke in 2014 as he met guidelines to take warfarin with new stroke with h/o a bioprosthetic valve.  Pt's daughter is having brain surgery in [place] 9/10, he wishes to get established with cardiology in [place] as he is afraid that he may not be able to get to [facility] as easily in the future with his daughter's health unknown.  Today, he denies symptoms of palpitations, chest pain,  orthopnea, PND, lower extremity edema, dizziness, presyncope, syncope, or neurologic sequela. + for fatigue, shortness of breath with activity. The patient is tolerating medications without difficulties and is otherwise without complaint today.   Past Medical History:  Diagnosis Date  . Anemia   . Anginal pain ([place])   . Bladder outlet obstruction   . Carotid artery occlusion   . Coronary artery disease    post stents; prior CABG; s/p cath January 2014 with 2 VD, patent SVG to [facility] and patent SVG to PL patent  . Degenerative disk disease   . Foley catheter in place   . GERD (gastroesophageal reflux disease)   . Heart murmur   . History of esophageal stricture    "has it stretched q once in awhile" (03/26/2012"  . Hyperlipidemia   . Hypertension   . Kidney stones   . Major depression   . OSA (obstructive sleep apnea)    "have a mask; haven't used one in years" (03/26/2012)  .  Prostate cancer ([place])   . Skin cancer of nose 2012   "right side" (03/26/2012)  . Skin cancer of trunk    "left chest" (03/26/2012)  . Spinal stenosis   . Stroke ([place]) 2014   NO RESIDUAL PROBLEMS  . Type II diabetes mellitus ([place])    Past Surgical History:  Procedure Laterality Date  . ADENOIDECTOMY  1956  . ANTERIOR CERVICAL DECOMP/DISCECTOMY FUSION  2000's  . AORTIC VALVE REPLACEMENT  08/29/2010   25 mm CE Magna bioprosthetic - [facility]  . APPENDECTOMY  1951  . CARDIAC CATHETERIZATION  10/20/2003   Est. EF of 65% -- Critical disease in the mid  to distal circumflex --  Preserved left ventricular  function --   . CARDIAC VALVE REPLACEMENT    . CAROTID ENDARTERECTOMY  05/05/2003   right carotid endarterectomy  . CERVICAL DISC SURGERY  1980's  . CORONARY ANGIOPLASTY WITH STENT PLACEMENT  10/20/2003   Percutaneous coronary intervention/drug-eluding stent implantation, third marginal branch --  Percutaneous closure, right femoral artery -- Atherosclerotic coronary vascular disease, single vessel / Status post successful percutaneous coronary intervention/tandem drug -- eluding stent implantation proximal third marginal branch -- Typical angina was not reproduced with device insertion or balloon   . CORONARY ARTERY BYPASS GRAFT  08/29/2010   CABG X2; SVG to PDA, SVG to [place].  [name] HEMATOMA EVACUATION  05/09/2003   Evacuation of hematoma, right neck  . LEFT HEART CATHETERIZATION WITH CORONARY ANGIOGRAM N/A 03/27/2012   Procedure: LEFT HEART CATHETERIZATION WITH CORONARY ANGIOGRAM;  Surgeon: Peter M Martinique, MD;  Location: [facility] CATH LAB;  Service: Cardiovascular;  Laterality: N/A;  . LUMBAR [place]  . MITRAL VALVE REPAIR  08/23/2010   28 mm Simulus ring -[facility]  . POSTERIOR FUSION LUMBAR SPINE  1997  . PROSTATE BIOPSY    . SHOULDER ARTHROSCOPY  01/07/2007   Left shoulder impingement, labral tear  . SKIN CANCER EXCISION  1990's; 2012   "left chest & right nose" (03/26/2012)  . TEE WITHOUT CARDIOVERSION N/A 05/15/2012   Procedure: TRANSESOPHAGEAL ECHOCARDIOGRAM (TEE);  Surgeon: Thayer Headings, MD;  Location: [place];  Service: Cardiovascular;  Laterality: N/A;  . [place]  . TRANSURETHRAL RESECTION OF PROSTATE N/A 04/26/2015   Procedure: TRANSURETHRAL RESECTION OF THE PROSTATE WITH GYRUS INSTRUMENTS;  Surgeon: Ardis Hughs, MD;  Location: [facility] ORS;  Service: Urology;  Laterality: N/A;  TURP-BIPOLAR    Current Outpatient Prescriptions  Medication Sig Dispense Refill  . amLODipine (NORVASC) 5 MG  tablet Take 2.5 mg by mouth every evening.     [name] apixaban (ELIQUIS) 5 MG TABS tablet Take 1 tablet (5 mg total) by mouth 2 (two) times daily. 60 tablet 0  . FLUoxetine (PROZAC) 20 MG capsule Take 20 mg by mouth at bedtime.     . Insulin Glargine (BASAGLAR KWIKPEN) 100 UNIT/ML SOPN Inject 25 Units into the skin every evening.     . Melatonin 3 MG TABS Take 1 tablet by mouth at bedtime.     . metFORMIN (GLUMETZA) 1000 MG (MOD) 24 hr tablet Take 1 tablet (1,000 mg total) by mouth 2 (two) times daily with a meal.    . metoprolol tartrate (LOPRESSOR) 50 MG tablet Take 1.5 tablets (75 mg total) by mouth 2 (two) times daily. 90 tablet 2  . Multiple Vitamin (MULTIVITAMIN) tablet Take 1 tablet by mouth daily.    . nitroGLYCERIN (NITROSTAT) 0.4 MG SL tablet Place 1 tablet (0.4 mg total)  under the tongue every 5 (five) minutes as needed. For chest pain 25 tablet 5  . omeprazole (PRILOSEC OTC) 20 MG tablet Take 1 tab daily by mouth    . rosuvastatin (CRESTOR) 10 MG tablet Take 10 mg by mouth daily.    [name] UNABLE TO FIND Med Name: Estrogen shot every 3-6 months for 2 years    . vitamin C (ASCORBIC ACID) 500 MG tablet Take 500 mg by mouth 2 (two) times daily.      [name] zolpidem (AMBIEN) 10 MG tablet Take 5 mg by mouth at bedtime as needed for sleep.      No current facility-administered medications for this encounter.     No Known Allergies  Social History   Social History  . Marital status: Married    Spouse name: N/A  . Number of children: N/A  . Years of education: N/A   Occupational History  . Not on file.   Social History Main Topics  . Smoking status: Former Smoker    Packs/day: 5.00    Years: 23.00    Types: Cigarettes    Quit date: 10/01/1978  . Smokeless tobacco: Never Used  . Alcohol use No     Comment: 03/26/2012 "last alcohol was in 1970; never had problem w/it"  . Drug use: No  . Sexual activity: No   Other Topics Concern  . Not on file   Social History Narrative  . No narrative  on file    Family History  Problem Relation Age of Onset  . Diabetes Mother   . Coronary artery disease Mother   . Heart disease Mother   . Stroke Father   . Cancer Father        prostate  . Cancer Daughter        lymphoma hodgkins   . Seizures Daughter     ROS- All systems are reviewed and negative except as per the HPI above  Physical Exam: Vitals:   11/15/16 0858  BP: 132/84  Pulse: (!) 114  Weight: 173 lb (78.5 kg)  Height: '5\' 6"'  (1.676 m)   Wt Readings from Last 3 Encounters:  11/15/16 173 lb (78.5 kg)  04/28/16 170 lb 9.6 oz (77.4 kg)  12/02/15 169 lb 8 oz (76.9 kg)    Labs: Lab Results  Component Value Date   NA 135 05/01/2015   K 4.4 05/01/2015   CL 101 05/01/2015   CO2 26 05/01/2015   GLUCOSE 336 (H) 05/01/2015   BUN 18 05/01/2015   CREATININE 1.04 05/01/2015   CALCIUM 9.2 05/01/2015   Lab Results  Component Value Date   INR 0.96 05/10/2012   Lab Results  Component Value Date   CHOL 117 05/10/2012   HDL 40 05/10/2012   LDLCALC 54 05/10/2012   TRIG 114 05/10/2012     GEN- The patient is well appearing, alert and oriented x 3 today.   Head- normocephalic, atraumatic Eyes-  Sclera clear, conjunctiva pink Ears- hearing intact Oropharynx- clear Neck- supple, no JVP Lymph- no cervical lymphadenopathy Lungs- Clear to ausculation bilaterally, normal work of breathing Heart- Regular rate and rhythm, no murmurs, rubs or gallops, PMI not laterally displaced GI- soft, NT, ND, + BS Extremities- no clubbing, cyanosis, or edema MS- no significant deformity or atrophy Skin- no rash or lesion Psych- euthymic mood, full affect Neuro- strength and sensation are intact  EKG- typical atrial flutter at 114 bpm, qrs int 106 ms, qtc 272 ms EKG (from PCP) 8/27- aflutter at  112 bpm Labs from PCP office show chronically anemic with hgb stable at 10.5, hct 31.6, plts 278, creat 0.85, BUN 12, K+5.1  Echo Results (from care everywhere)- 06/2016-EF 50%, Left  atrium 4.5 cm, Mild TR, Mild MR, mitral valve stenosis grade prosthetic MV ring, MV stenosis mean gradient 5 mm hg, aortic valve regurg- grade none,AV stenosis grade bioprosthetic with mean gradient 7 mm Hg, max velocity 1.1ms   Assessment and Plan: 1.New onset symptomatic typical atrial flutter with RVR General education re afib/flutter Increase metoprolol to 75 mg bid for better rate control Watch for signs of increasing weight, PND, Orthopnea, ankle or abdominal swelling, indicating he is not tolerating arrythmia as well, and report if present  2. Chadsvasc score of at least 6 He is at mod to high risk of stroke with current arrhythmia and risk factors For short term, he will continue on eliquis 5 mg bid 30 day free coupon given General bleeding precautions discussed Pt was taken off warfarin in 2014 for neurology's concern of amyloid on brain scan and chance of brain hemorrhage I discussed with Dr. ARayann Hemanand he is in agreement that with current arrhythmia, the benefit of anticoagulation outweighs the risk of hemorrhage IF he continues to show typical atrial flutter, he may be able to have aflutter ablation and only have to use anticoagulation short term  I will request for him to  establish with Dr. ARayann Hemanin the next week and then he can be later referred to general cardiology as [place]   [name], [facility]GP[place] [phone]

## 2016-11-15 NOTE — Addendum Note (Signed)
Encounter addended by: Sherran Needs, NP on: 11/15/2016  3:01 PM<BR>    Actions taken: Sign clinical note

## 2016-11-15 NOTE — Patient Instructions (Signed)
Your physician has recommended you make the following change in your medication:  1)Increase metoprolol to 75mg  twice a day (1 and 1/2 tablets of your 50mg  tablet twice a day)  Appt with Dr. Rayann Heman -- scheduler will be in touch with you.

## 2016-11-22 ENCOUNTER — Institutional Professional Consult (permissible substitution): Payer: Medicare Other | Admitting: Internal Medicine

## 2016-11-23 DIAGNOSIS — D509 Iron deficiency anemia, unspecified: Secondary | ICD-10-CM | POA: Diagnosis not present

## 2016-11-23 DIAGNOSIS — E1142 Type 2 diabetes mellitus with diabetic polyneuropathy: Secondary | ICD-10-CM | POA: Diagnosis not present

## 2016-11-23 DIAGNOSIS — I10 Essential (primary) hypertension: Secondary | ICD-10-CM | POA: Diagnosis not present

## 2016-11-23 DIAGNOSIS — I4892 Unspecified atrial flutter: Secondary | ICD-10-CM | POA: Diagnosis not present

## 2016-11-23 DIAGNOSIS — E1165 Type 2 diabetes mellitus with hyperglycemia: Secondary | ICD-10-CM | POA: Diagnosis not present

## 2016-11-23 DIAGNOSIS — Z794 Long term (current) use of insulin: Secondary | ICD-10-CM | POA: Diagnosis not present

## 2016-11-23 DIAGNOSIS — Z7984 Long term (current) use of oral hypoglycemic drugs: Secondary | ICD-10-CM | POA: Diagnosis not present

## 2016-11-23 DIAGNOSIS — E1122 Type 2 diabetes mellitus with diabetic chronic kidney disease: Secondary | ICD-10-CM | POA: Diagnosis not present

## 2016-11-24 ENCOUNTER — Ambulatory Visit (INDEPENDENT_AMBULATORY_CARE_PROVIDER_SITE_OTHER): Payer: Medicare Other | Admitting: Internal Medicine

## 2016-11-24 VITALS — BP 110/68 | HR 113 | Ht 66.0 in | Wt 172.0 lb

## 2016-11-24 DIAGNOSIS — I38 Endocarditis, valve unspecified: Secondary | ICD-10-CM

## 2016-11-24 DIAGNOSIS — I483 Typical atrial flutter: Secondary | ICD-10-CM

## 2016-11-24 DIAGNOSIS — I10 Essential (primary) hypertension: Secondary | ICD-10-CM

## 2016-11-24 DIAGNOSIS — Z8546 Personal history of malignant neoplasm of prostate: Secondary | ICD-10-CM | POA: Diagnosis not present

## 2016-11-24 MED ORDER — METOPROLOL TARTRATE 50 MG PO TABS: 50.0000 mg | ORAL_TABLET | Freq: Two times a day (BID) | ORAL | 3 refills | Status: DC

## 2016-11-24 NOTE — Patient Instructions (Addendum)
Medication Instructions:  Your physician recommends that you continue on your current medications as directed. Please refer to the Current Medication list given to you today.    Labwork: Your physician recommends that you return for lab work today: BMP/CBC   Testing/Procedures:   Your physician has requested that you have a TEE. During a TEE, sound waves are used to create images of your heart. It provides your doctor with information about the size and shape of your heart and how well your heart's chambers and valves are working. In this test, a transducer is attached to the end of a flexible tube that's guided down your throat and into your esophagus (the tube leading from you mouth to your stomach) to get a more detailed image of your heart. You are not awake for the procedure. Please see the instruction sheet given to you today. For further information please visit [email or web address]    Your physician has recommended that you have an ablation. Catheter ablation is a medical procedure used to treat some cardiac arrhythmias (irregular heartbeats). During catheter ablation, a long, thin, flexible tube is put into a blood vessel in your groin (upper thigh), or neck. This tube is called an ablation catheter. It is then guided to your heart through the blood vessel. Radio frequency waves destroy small areas of heart tissue where abnormal heartbeats may cause an arrhythmia to start. Please see the instruction sheet given to you today.---12/01/16  Please arrive at [facility] at 6:30am Do not eat or drink after midnight the night prior to the procedure Do not take any medications the morning of the test Plan for one night stay Will need someone to drive you home at discharge   Cardiac Ablation Cardiac ablation is a procedure to disable (ablate) a small amount of heart tissue in very specific places. The heart has many electrical connections.  Sometimes these connections are abnormal and can cause the heart to beat very fast or irregularly. Ablating some of the problem areas can improve the heart rhythm or return it to normal. Ablation may be done for people who:  Have Wolff-Parkinson-White syndrome.  Have fast heart rhythms (tachycardia).  Have taken medicines for an abnormal heart rhythm (arrhythmia) that were not effective or caused side effects.  Have a high-risk heartbeat that may be life-threatening.  During the procedure, a small incision is made in the neck or the groin, and a long, thin, flexible tube (catheter) is inserted into the incision and moved to the heart. Small devices (electrodes) on the tip of the catheter will send out electrical currents. A type of X-ray (fluoroscopy) will be used to help guide the catheter and to provide images of the heart. Tell a health care provider about:  Any allergies you have.  All medicines you are taking, including vitamins, herbs, eye drops, creams, and over-the-counter medicines.  Any problems you or family members have had with anesthetic medicines.  Any blood disorders you have.  Any surgeries you have had.  Any medical conditions you have, such as kidney failure.  Whether you are pregnant or may be pregnant. What are the risks? Generally, this is a safe procedure. However, problems may occur, including:  Infection.  Bruising and bleeding at the catheter insertion site.  Bleeding into the chest, especially into the sac that surrounds the heart. This is a serious complication.  Stroke or blood clots.  Damage to other structures or organs.  Allergic reaction to medicines  or dyes.  Need for a permanent pacemaker if the normal electrical system is damaged. A pacemaker is a small computer that sends electrical signals to the heart and helps your heart beat normally.  The procedure not being fully effective. This may not be recognized until months later. Repeat  ablation procedures are sometimes required.  What happens before the procedure?  Follow instructions from your health care provider about eating or drinking restrictions.  Ask your health care provider about: ? Changing or stopping your regular medicines. This is especially important if you are taking diabetes medicines or blood thinners. ? Taking medicines such as aspirin and ibuprofen. These medicines can thin your blood. Do not take these medicines before your procedure if your health care provider instructs you not to.  Plan to have someone take you home from the hospital or clinic.  If you will be going home right after the procedure, plan to have someone with you for 24 hours. What happens during the procedure?  To lower your risk of infection: ? Your health care team will wash or sanitize their hands. ? Your skin will be washed with soap. ? Hair may be removed from the incision area.  An IV tube will be inserted into one of your veins.  You will be given a medicine to help you relax (sedative).  The skin on your neck or groin will be numbed.  An incision will be made in your neck or your groin.  A needle will be inserted through the incision and into a large vein in your neck or groin.  A catheter will be inserted into the needle and moved to your heart.  Dye may be injected through the catheter to help your surgeon see the area of the heart that needs treatment.  Electrical currents will be sent from the catheter to ablate heart tissue in desired areas. There are three types of energy that may be used to ablate heart tissue: ? Heat (radiofrequency energy). ? Laser energy. ? Extreme cold (cryoablation).  When the necessary tissue has been ablated, the catheter will be removed.  Pressure will be held on the catheter insertion area to prevent excessive bleeding.  A bandage (dressing) will be placed over the catheter insertion area. The procedure may vary among health  care providers and hospitals. What happens after the procedure?  Your blood pressure, heart rate, breathing rate, and blood oxygen level will be monitored until the medicines you were given have worn off.  Your catheter insertion area will be monitored for bleeding. You will need to lie still for a few hours to ensure that you do not bleed from the catheter insertion area.  Do not drive for 24 hours or as long as directed by your health care provider. Summary  Cardiac ablation is a procedure to disable (ablate) a small amount of heart tissue in very specific places. Ablating some of the problem areas can improve the heart rhythm or return it to normal.  During the procedure, electrical currents will be sent from the catheter to ablate heart tissue in desired areas. This information is not intended to replace advice given to you by your health care provider. Make sure you discuss any questions you have with your health care provider. Document Released: 07/23/2008 Document Revised: 01/24/2016 Document Reviewed: 01/24/2016 Elsevier Interactive Patient Education  2018 [place]:  Your physician recommends that you schedule a follow-up appointment in: 4 weeks from 12/01/16 with Dr Rayann Heman  Thank you for choosing [facility]!!     Janan Halter, RN [phone]

## 2016-11-24 NOTE — Progress Notes (Signed)
Electrophysiology Office Note   Date:  11/24/2016   ID:  ROWEN WILMER, DOB 02/07/1936, MRN [id number]  PCP:  Lavone Orn, MD  Cardiologist:  [facility] Primary Electrophysiologist: Thompson Grayer, MD    CC: atrial flutter   History of Present Illness: KEIDEN DESKIN is a 81 y.o. male who presents today for electrophysiology evaluation.   He is referred by Roderic Palau NP for further evaluation of atrial flutter.  He has a h/o valvular heart disease for which he has been followed at [facility].  Recently he has developed symptomatic atrial flutter.  His daughter is having brain surgery by Dr Ellene Route next week and the patient does not feel that he can travel to [place] at this time for his EP evaluation.   He reports symptoms of fatigue with his atrial flutter.  He has been placed on eliquis given his prior stroke and chads2vasc score of at least 7.  He has vascular amyloid and is felt to be a poor candidate for long term anticoagulation.  He is therefore referred for treatment options.   Today, he denies symptoms of palpitations, chest pain, shortness of breath, orthopnea, PND, lower extremity edema, claudication, dizziness, presyncope, syncope, bleeding, or neurologic sequela. The patient is tolerating medications without difficulties and is otherwise without complaint today.    Past Medical History:  Diagnosis Date  . Anemia   . Anginal pain ([place])   . Bladder outlet obstruction   . Carotid artery occlusion   . Coronary artery disease    post stents; prior CABG; s/p cath January 2014 with 2 VD, patent SVG to [facility] and patent SVG to PL patent  . Degenerative disk disease   . Foley catheter in place   . GERD (gastroesophageal reflux disease)   . Heart murmur   . History of esophageal stricture    "has it stretched q once in awhile" (03/26/2012"  . Hyperlipidemia   . Hypertension   . Kidney stones   . Major depression   . OSA (obstructive sleep apnea)    "have a mask; haven't used  one in years" (03/26/2012)  . Prostate cancer ([place])   . Skin cancer of nose 2012   "right side" (03/26/2012)  . Skin cancer of trunk    "left chest" (03/26/2012)  . Spinal stenosis   . Stroke ([place]) 2014   NO RESIDUAL PROBLEMS  . Type II diabetes mellitus ([place])    Past Surgical History:  Procedure Laterality Date  . ADENOIDECTOMY  1956  . ANTERIOR CERVICAL DECOMP/DISCECTOMY FUSION  2000's  . AORTIC VALVE REPLACEMENT  08/29/2010   25 mm CE Magna bioprosthetic - [facility]  . APPENDECTOMY  1951  . CARDIAC CATHETERIZATION  10/20/2003   Est. EF of 65% -- Critical disease in the mid to distal circumflex --  Preserved left ventricular  function --   . CARDIAC VALVE REPLACEMENT    . CAROTID ENDARTERECTOMY  05/05/2003   right carotid endarterectomy  . CERVICAL DISC SURGERY  1980's  . CORONARY ANGIOPLASTY WITH STENT PLACEMENT  10/20/2003   Percutaneous coronary intervention/drug-eluding stent implantation, third marginal branch --  Percutaneous closure, right femoral artery -- Atherosclerotic coronary vascular disease, single vessel / Status post successful percutaneous coronary intervention/tandem drug -- eluding stent implantation proximal third marginal branch -- Typical angina was not reproduced with device insertion or balloon   . CORONARY ARTERY BYPASS GRAFT  08/29/2010   CABG X2; SVG to PDA, SVG to [place].  [name] HEMATOMA EVACUATION  05/09/2003   Evacuation of hematoma, right neck  . LEFT HEART CATHETERIZATION WITH CORONARY ANGIOGRAM N/A 03/27/2012   Procedure: LEFT HEART CATHETERIZATION WITH CORONARY ANGIOGRAM;  Surgeon: Peter M Martinique, MD;  Location: [facility] CATH LAB;  Service: Cardiovascular;  Laterality: N/A;  . LUMBAR [place]  . MITRAL VALVE REPAIR  08/23/2010   28 mm Simulus ring -[facility]  . POSTERIOR FUSION LUMBAR SPINE  1997  . PROSTATE BIOPSY    . SHOULDER ARTHROSCOPY  01/07/2007   Left shoulder impingement, labral tear  . SKIN CANCER EXCISION  1990's; 2012   "left chest & right  nose" (03/26/2012)  . TEE WITHOUT CARDIOVERSION N/A 05/15/2012   Procedure: TRANSESOPHAGEAL ECHOCARDIOGRAM (TEE);  Surgeon: Thayer Headings, MD;  Location: [facility];  Service: Cardiovascular;  Laterality: N/A;  . [place]  . TRANSURETHRAL RESECTION OF PROSTATE N/A 04/26/2015   Procedure: TRANSURETHRAL RESECTION OF THE PROSTATE WITH GYRUS INSTRUMENTS;  Surgeon: Ardis Hughs, MD;  Location: [facility] ORS;  Service: Urology;  Laterality: N/A;  TURP-BIPOLAR     Current Outpatient Prescriptions  Medication Sig Dispense Refill  . amLODipine (NORVASC) 5 MG tablet Take 2.5 mg by mouth every evening.     [name] apixaban (ELIQUIS) 5 MG TABS tablet Take 1 tablet (5 mg total) by mouth 2 (two) times daily. 60 tablet 0  . FLUoxetine (PROZAC) 20 MG capsule Take 20 mg by mouth at bedtime.     . Insulin Glargine (BASAGLAR KWIKPEN) 100 UNIT/ML SOPN Inject 36 Units into the skin every evening. 18  in the am and 18 in the pm    . Leuprolide Acetate, 6 Month, (LUPRON DEPOT, 73-MONTH, IM) Inject into the muscle.    . Melatonin 3 MG TABS Take 1 tablet by mouth at bedtime.     . metFORMIN (GLUMETZA) 1000 MG (MOD) 24 hr tablet Take 1 tablet (1,000 mg total) by mouth 2 (two) times daily with a meal.    . metoprolol tartrate (LOPRESSOR) 50 MG tablet Take 1 tablet (50 mg total) by mouth 2 (two) times daily. 180 tablet 3  . Multiple Vitamin (MULTIVITAMIN) tablet Take 1 tablet by mouth daily.    . nitroGLYCERIN (NITROSTAT) 0.4 MG SL tablet Place 1 tablet (0.4 mg total) under the tongue every 5 (five) minutes as needed. For chest pain 25 tablet 5  . omeprazole (PRILOSEC OTC) 20 MG tablet Take 1 tab daily by mouth    . rosuvastatin (CRESTOR) 10 MG tablet Take 10 mg by mouth daily.    [name] UNABLE TO FIND Med Name: Estrogen shot every 3-6 months for 2 years    . vitamin C (ASCORBIC ACID) 500 MG tablet Take 500 mg by mouth 2 (two) times daily.      [name] zolpidem (AMBIEN) 10 MG tablet Take 5 mg by mouth at  bedtime as needed for sleep.      No current facility-administered medications for this visit.     Allergies:   Patient has no known allergies.   Social History:  The patient  reports that he quit smoking about 38 years ago. His smoking use included Cigarettes. He has a 115.00 pack-year smoking history. He has never used smokeless tobacco. He reports that he does not drink alcohol or use drugs.   Family History:  The patient's  family history includes Cancer in his daughter and father; Coronary artery disease in his mother; Diabetes in his mother; Heart disease in his mother; Seizures in his daughter;  Stroke in his father.    ROS:  Please see the history of present illness.   All other systems are personally reviewed and negative.    PHYSICAL EXAM: VS:  BP 110/68   Pulse (!) 113   Ht 5\' 6"  (1.676 m)   Wt 172 lb (78 kg)   BMI 27.76 kg/m  , BMI Body mass index is 27.76 kg/m. GEN: Well nourished, well developed, in no acute distress  HEENT: normal  Neck: no JVD, carotid bruits, or masses Cardiac: iRRR; no murmurs, rubs, or gallops,no edema  Respiratory:  clear to auscultation bilaterally, normal work of breathing GI: soft, nontender, nondistended, + BS MS: no deformity or atrophy  Skin: warm and dry  Neuro:  Strength and sensation are intact Psych: euthymic mood, full affect  EKG:  EKG is ordered today. The ekg ordered today is personally reviewed and shows atrial flutter, possibly typical with V rates 113 bpm, nonspecific ST/T changes   Lipid Panel     Component Value Date/Time   CHOL 117 05/10/2012 0229   TRIG 114 05/10/2012 0229   HDL 40 05/10/2012 0229   CHOLHDL 2.9 05/10/2012 0229   VLDL 23 05/10/2012 0229   LDLCALC 54 05/10/2012 0229   personally reviewed   Wt Readings from Last 3 Encounters:  11/24/16 172 lb (78 kg)  11/15/16 173 lb (78.5 kg)  04/28/16 170 lb 9.6 oz (77.4 kg)      Other studies personally reviewed: Additional studies/ records that were  reviewed today include: AF clinic notes, prior tee from 2014, ekg  Review of the above records today demonstrates: as above   ASSESSMENT AND PLAN:  1.  Atrial flutter Typical appearing by ekg however he has had extensive valve surgery and could have an atypical atrial flutter.  He does not have documented atrial fibrillation.  He is symptomatic.  He has had prior stroke and chads2vasc score of 7.  Given vascular amyloid, he is felt to be a poor candidate for long term anticoagulation due to bleeding risks.  I think the best option may be to proceed with TEE guided atrial flutter ablation with plans to stop discontinuation of anticoagulation 30 days post ablation. Therapeutic strategies for atrial flutter including medicine and ablation were discussed in detail with the patient today. Risk, benefits, and alternatives to EP study and radiofrequency ablation were also discussed in detail today. These risks include but are not limited to stroke, bleeding, vascular damage, tamponade, perforation, damage to the heart and other structures, AV block requiring pacemaker, worsening renal function, and death. The patient understands these risk and wishes to proceed.  We will therefore proceed with catheter ablation at the next available time.  carto and anesthesia are requested for the procedure.  Risks of TEE also discussed today  2. Valvular heart disease Continue current medical therapy  3. HTN Stable No change required today  4. CAD No ischemic symptoms No changes today  Current medicines are reviewed at length with the patient today.   The patient does not have concerns regarding his medicines.  The following changes were made today:  none  Labs/ tests ordered today include:  Orders Placed This Encounter  Procedures  . EKG 12-Lead     Signed, Thompson Grayer, MD  11/24/2016 4:26 PM     [address] [place] [address] [phone]  (office) [phone] (fax)

## 2016-11-26 ENCOUNTER — Encounter: Payer: Self-pay | Admitting: Internal Medicine

## 2016-12-01 ENCOUNTER — Ambulatory Visit (HOSPITAL_COMMUNITY)
Admission: RE | Admit: 2016-12-01 | Discharge: 2016-12-01 | Disposition: A | Discharge status: Home / Self Care | Payer: Medicare Other | Source: Ambulatory Visit | Attending: Internal Medicine | Admitting: Internal Medicine

## 2016-12-01 ENCOUNTER — Encounter (HOSPITAL_COMMUNITY)
Admission: RE | Disposition: A | Discharge status: Home / Self Care | Payer: Self-pay | Source: Ambulatory Visit | Attending: Internal Medicine

## 2016-12-01 ENCOUNTER — Encounter (HOSPITAL_COMMUNITY): Payer: Self-pay | Admitting: *Deleted

## 2016-12-01 ENCOUNTER — Ambulatory Visit (HOSPITAL_COMMUNITY): Payer: Medicare Other | Admitting: Anesthesiology

## 2016-12-01 ENCOUNTER — Ambulatory Visit (HOSPITAL_BASED_OUTPATIENT_CLINIC_OR_DEPARTMENT_OTHER): Payer: Medicare Other

## 2016-12-01 DIAGNOSIS — G4733 Obstructive sleep apnea (adult) (pediatric): Secondary | ICD-10-CM | POA: Diagnosis not present

## 2016-12-01 DIAGNOSIS — Z952 Presence of prosthetic heart valve: Secondary | ICD-10-CM | POA: Insufficient documentation

## 2016-12-01 DIAGNOSIS — E1142 Type 2 diabetes mellitus with diabetic polyneuropathy: Secondary | ICD-10-CM | POA: Diagnosis not present

## 2016-12-01 DIAGNOSIS — F329 Major depressive disorder, single episode, unspecified: Secondary | ICD-10-CM | POA: Diagnosis not present

## 2016-12-01 DIAGNOSIS — Z8546 Personal history of malignant neoplasm of prostate: Secondary | ICD-10-CM | POA: Diagnosis not present

## 2016-12-01 DIAGNOSIS — Z951 Presence of aortocoronary bypass graft: Secondary | ICD-10-CM | POA: Insufficient documentation

## 2016-12-01 DIAGNOSIS — I2510 Atherosclerotic heart disease of native coronary artery without angina pectoris: Secondary | ICD-10-CM | POA: Insufficient documentation

## 2016-12-01 DIAGNOSIS — I483 Typical atrial flutter: Secondary | ICD-10-CM | POA: Diagnosis not present

## 2016-12-01 DIAGNOSIS — I4892 Unspecified atrial flutter: Secondary | ICD-10-CM

## 2016-12-01 DIAGNOSIS — C61 Malignant neoplasm of prostate: Secondary | ICD-10-CM | POA: Diagnosis not present

## 2016-12-01 DIAGNOSIS — Z8673 Personal history of transient ischemic attack (TIA), and cerebral infarction without residual deficits: Secondary | ICD-10-CM | POA: Diagnosis not present

## 2016-12-01 DIAGNOSIS — E785 Hyperlipidemia, unspecified: Secondary | ICD-10-CM | POA: Diagnosis not present

## 2016-12-01 DIAGNOSIS — G4730 Sleep apnea, unspecified: Secondary | ICD-10-CM | POA: Diagnosis not present

## 2016-12-01 DIAGNOSIS — Z87891 Personal history of nicotine dependence: Secondary | ICD-10-CM | POA: Diagnosis not present

## 2016-12-01 DIAGNOSIS — I340 Nonrheumatic mitral (valve) insufficiency: Secondary | ICD-10-CM | POA: Diagnosis not present

## 2016-12-01 DIAGNOSIS — E119 Type 2 diabetes mellitus without complications: Secondary | ICD-10-CM | POA: Diagnosis not present

## 2016-12-01 DIAGNOSIS — Z794 Long term (current) use of insulin: Secondary | ICD-10-CM | POA: Insufficient documentation

## 2016-12-01 DIAGNOSIS — Z85828 Personal history of other malignant neoplasm of skin: Secondary | ICD-10-CM | POA: Diagnosis not present

## 2016-12-01 DIAGNOSIS — I10 Essential (primary) hypertension: Secondary | ICD-10-CM | POA: Insufficient documentation

## 2016-12-01 HISTORY — PX: A-FLUTTER ABLATION: EP1230

## 2016-12-01 HISTORY — PX: TEE WITHOUT CARDIOVERSION: SHX5443

## 2016-12-01 LAB — BASIC METABOLIC PANEL
ANION GAP: 6 (ref 5–15)
BUN: 17 mg/dL (ref 6–20)
CALCIUM: 9 mg/dL (ref 8.9–10.3)
CHLORIDE: 110 mmol/L (ref 101–111)
CO2: 24 mmol/L (ref 22–32)
Creatinine, Ser: 0.84 mg/dL (ref 0.61–1.24)
GFR calc non Af Amer: >60 mL/min (ref >60)
GLUCOSE: 102 mg/dL — AB (ref 65–99)
POTASSIUM: 3.9 mmol/L (ref 3.5–5.1)
Sodium: 140 mmol/L (ref 135–145)

## 2016-12-01 LAB — CBC
HEMATOCRIT: 29.3 % — AB (ref 39.0–52.0)
HEMOGLOBIN: 9.1 g/dL — AB (ref 13.0–17.0)
MCH: 27.7 pg (ref 26.0–34.0)
MCHC: 31.1 g/dL (ref 30.0–36.0)
MCV: 89.3 fL (ref 78.0–100.0)
Platelets: 221 10*3/uL (ref 150–400)
RBC: 3.28 MIL/uL — AB (ref 4.22–5.81)
RDW: 15.4 % (ref 11.5–15.5)
WBC: 7.7 10*3/uL (ref 4.0–10.5)

## 2016-12-01 LAB — GLUCOSE, CAPILLARY: Glucose-Capillary: 134 mg/dL — ABNORMAL HIGH (ref 65–99)

## 2016-12-01 SURGERY — A-FLUTTER ABLATION
Anesthesia: General

## 2016-12-01 SURGERY — ECHOCARDIOGRAM, TRANSESOPHAGEAL
Anesthesia: Moderate Sedation

## 2016-12-01 MED ORDER — SODIUM CHLORIDE 0.9 % IV SOLN: INTRAVENOUS | Status: DC

## 2016-12-01 MED ORDER — MIDAZOLAM HCL 5 MG/ML IJ SOLN
INTRAMUSCULAR | Status: AC
  Filled 2016-12-01: qty 2

## 2016-12-01 MED ORDER — APIXABAN 5 MG PO TABS
5.0000 mg | ORAL_TABLET | ORAL | Status: AC
  Administered 2016-12-01: 5 mg via ORAL
  Filled 2016-12-01: qty 1

## 2016-12-01 MED ORDER — FENTANYL CITRATE (PF) 100 MCG/2ML IJ SOLN
INTRAMUSCULAR | Status: AC
  Filled 2016-12-01: qty 2

## 2016-12-01 MED ORDER — HEPARIN (PORCINE) IN NACL 2-0.9 UNIT/ML-% IJ SOLN
INTRAMUSCULAR | Status: AC | PRN
  Administered 2016-12-01: 500 mL

## 2016-12-01 MED ORDER — LIDOCAINE HCL (PF) 1 % IJ SOLN
INTRAMUSCULAR | Status: DC | PRN
  Administered 2016-12-01: 45 mL

## 2016-12-01 MED ORDER — HYDROCODONE-ACETAMINOPHEN 5-325 MG PO TABS: 1.0000 | ORAL_TABLET | ORAL | Status: DC | PRN

## 2016-12-01 MED ORDER — PROPOFOL 10 MG/ML IV BOLUS
INTRAVENOUS | Status: DC | PRN
  Administered 2016-12-01: 30 mg via INTRAVENOUS
  Administered 2016-12-01: 120 mg via INTRAVENOUS

## 2016-12-01 MED ORDER — MIDAZOLAM HCL 10 MG/2ML IJ SOLN
INTRAMUSCULAR | Status: DC | PRN
  Administered 2016-12-01: 2 mg via INTRAVENOUS
  Administered 2016-12-01 (×2): 1 mg via INTRAVENOUS

## 2016-12-01 MED ORDER — SODIUM CHLORIDE 0.9 % IV SOLN
INTRAVENOUS | Status: DC | PRN
  Administered 2016-12-01: 10:00:00 via INTRAVENOUS

## 2016-12-01 MED ORDER — FENTANYL CITRATE (PF) 100 MCG/2ML IJ SOLN
INTRAMUSCULAR | Status: DC | PRN
  Administered 2016-12-01: 25 ug via INTRAVENOUS
  Administered 2016-12-01 (×2): 12.5 ug via INTRAVENOUS

## 2016-12-01 MED ORDER — LIDOCAINE HCL (PF) 1 % IJ SOLN
INTRAMUSCULAR | Status: AC
  Filled 2016-12-01: qty 30

## 2016-12-01 MED ORDER — SODIUM CHLORIDE 0.9 % IV SOLN
INTRAVENOUS | Status: DC
  Administered 2016-12-01: 500 mL via INTRAVENOUS

## 2016-12-01 MED ORDER — PHENYLEPHRINE HCL 10 MG/ML IJ SOLN
INTRAMUSCULAR | Status: DC | PRN
  Administered 2016-12-01: 40 ug/min via INTRAVENOUS

## 2016-12-01 MED ORDER — ONDANSETRON HCL 4 MG/2ML IJ SOLN
INTRAMUSCULAR | Status: DC | PRN
  Administered 2016-12-01: 4 mg via INTRAVENOUS

## 2016-12-01 MED ORDER — SODIUM CHLORIDE 0.9 % IV SOLN: 250.0000 mL | INTRAVENOUS | Status: DC | PRN

## 2016-12-01 MED ORDER — PHENYLEPHRINE HCL 10 MG/ML IJ SOLN
INTRAMUSCULAR | Status: DC | PRN
  Administered 2016-12-01: 40 ug via INTRAVENOUS

## 2016-12-01 MED ORDER — LIDOCAINE HCL (CARDIAC) 20 MG/ML IV SOLN
INTRAVENOUS | Status: DC | PRN
  Administered 2016-12-01: 100 mg via INTRAVENOUS

## 2016-12-01 MED ORDER — DEXAMETHASONE SODIUM PHOSPHATE 10 MG/ML IJ SOLN
INTRAMUSCULAR | Status: DC | PRN
  Administered 2016-12-01: 4 mg via INTRAVENOUS

## 2016-12-01 MED ORDER — LIDOCAINE VISCOUS 2 % MT SOLN
OROMUCOSAL | Status: DC | PRN
  Administered 2016-12-01: 10 mL via OROMUCOSAL

## 2016-12-01 MED ORDER — LIDOCAINE VISCOUS 2 % MT SOLN
OROMUCOSAL | Status: AC
  Filled 2016-12-01: qty 15

## 2016-12-01 MED ORDER — SODIUM CHLORIDE 0.9% FLUSH: 3.0000 mL | Freq: Two times a day (BID) | INTRAVENOUS | Status: DC

## 2016-12-01 MED ORDER — SODIUM CHLORIDE 0.9% FLUSH: 3.0000 mL | INTRAVENOUS | Status: DC | PRN

## 2016-12-01 SURGICAL SUPPLY — 12 items
BAG SNAP BAND KOVER 36X36 (MISCELLANEOUS) ×3 IMPLANT
BLANKET WARM UNDERBOD FULL ACC (MISCELLANEOUS) ×3 IMPLANT
CATH DUODECA HALO/ISMUS 7FR (CATHETERS) ×2 IMPLANT
CATH EZ STEER NAV 8MM F-J CUR (ABLATOR) ×2 IMPLANT
CATH WEBSTER BI DIR CS D-F CRV (CATHETERS) ×2 IMPLANT
PACK EP LATEX FREE (CUSTOM PROCEDURE TRAY) ×3
PACK EP LF (CUSTOM PROCEDURE TRAY) ×1 IMPLANT
PAD DEFIB LIFELINK (PAD) ×3 IMPLANT
PATCH CARTO3 (PAD) ×2 IMPLANT
SHEATH PINNACLE 6F 10CM (SHEATH) ×2 IMPLANT
SHEATH PINNACLE 7F 10CM (SHEATH) ×4 IMPLANT
SHEATH PINNACLE 8F 10CM (SHEATH) ×2 IMPLANT

## 2016-12-01 NOTE — Anesthesia Procedure Notes (Signed)
Procedure Name: LMA Insertion Date/Time: 12/01/2016 10:42 AM Performed by: Carney Living Pre-anesthesia Checklist: Patient identified, Emergency Drugs available, Suction available, Patient being monitored and Timeout performed Patient Re-evaluated:Patient Re-evaluated prior to induction Oxygen Delivery Method: Circle system utilized Preoxygenation: Pre-oxygenation with 100% oxygen Induction Type: IV induction LMA: LMA inserted LMA Size: 4.0 Number of attempts: 1 Placement Confirmation: positive ETCO2 and breath sounds checked- equal and bilateral Tube secured with: Tape Dental Injury: Teeth and Oropharynx as per pre-operative assessment

## 2016-12-01 NOTE — Progress Notes (Signed)
Per Dr Rayann Heman client may be discharged after 5 hours of bedrest

## 2016-12-01 NOTE — Progress Notes (Signed)
Site Area: RFV x 3 Site Prior to Removal: Level 0 Pressure Applied For:  15   minutes Manual: YES Patient Status During Pull: stable Post Pull Site: Level 0 Post Pull Instructions Given: YES Post Pull Pulses Present: palpable Dressing Applied: gauze and tegaderm Bedrest begins @ 1310 till 1910 x 6 hours Comments:  Removed by  Verta Ellen, RN

## 2016-12-01 NOTE — Progress Notes (Signed)
  Echocardiogram Echocardiogram Transesophageal has been performed.  Johny Chess 12/01/2016, 9:54 AM

## 2016-12-01 NOTE — Op Note (Signed)
Full report to follow in CV section of chart  AV prosthesis opens well  Trivial AI MV repair.  There is mild MR TV is mildly thickend  Mild TR PV is mildly thickened  Mild PI LVEF is severely depressed with diffuse hypokinesis  LA, LAA without masses. NO PFO by color doppler  MIld fixed atherosclerotic plaquing .

## 2016-12-01 NOTE — Interval H&P Note (Signed)
History and Physical Interval Note:  12/01/2016 8:03 AM  [name]  has presented today for surgery, with the diagnosis of a-flutter  The various methods of treatment have been discussed with the patient and family. After consideration of risks, benefits and other options for treatment, the patient has consented to  Procedure(s): A-Flutter Ablation (N/A) as a surgical intervention .  The patient's history has been reviewed, patient examined, no change in status, stable for surgery.  I have reviewed the patient's chart and labs.  Questions were answered to the patient's satisfaction.     Thompson Grayer

## 2016-12-01 NOTE — H&P (View-Only) (Signed)
Electrophysiology Office Note   Date:  11/24/2016   ID:  [name], DOB 1935-08-21, MRN [id number]  PCP:  Lavone Orn, MD  Cardiologist:  [facility] Primary Electrophysiologist: Thompson Grayer, MD    CC: atrial flutter   History of Present Illness: [name] is a 81 y.o. male who presents today for electrophysiology evaluation.   He is referred by Roderic Palau NP for further evaluation of atrial flutter.  He has a h/o valvular heart disease for which he has been followed at [facility].  Recently he has developed symptomatic atrial flutter.  His daughter is having brain surgery by Dr Ellene Route next week and the patient does not feel that he can travel to [place] at this time for his EP evaluation.   He reports symptoms of fatigue with his atrial flutter.  He has been placed on eliquis given his prior stroke and chads2vasc score of at least 7.  He has vascular amyloid and is felt to be a poor candidate for long term anticoagulation.  He is therefore referred for treatment options.   Today, he denies symptoms of palpitations, chest pain, shortness of breath, orthopnea, PND, lower extremity edema, claudication, dizziness, presyncope, syncope, bleeding, or neurologic sequela. The patient is tolerating medications without difficulties and is otherwise without complaint today.    Past Medical History:  Diagnosis Date  . Anemia   . Anginal pain ([place])   . Bladder outlet obstruction   . Carotid artery occlusion   . Coronary artery disease    post stents; prior CABG; s/p cath January 2014 with 2 VD, patent SVG to [facility] and patent SVG to PL patent  . Degenerative disk disease   . Foley catheter in place   . GERD (gastroesophageal reflux disease)   . Heart murmur   . History of esophageal stricture    "has it stretched q once in awhile" (03/26/2012"  . Hyperlipidemia   . Hypertension   . Kidney stones   . Major depression   . OSA (obstructive sleep apnea)    "have a mask; haven't used  one in years" (03/26/2012)  . Prostate cancer ([place])   . Skin cancer of nose 2012   "right side" (03/26/2012)  . Skin cancer of trunk    "left chest" (03/26/2012)  . Spinal stenosis   . Stroke ([place]) 2014   NO RESIDUAL PROBLEMS  . Type II diabetes mellitus ([place])    Past Surgical History:  Procedure Laterality Date  . ADENOIDECTOMY  1956  . ANTERIOR CERVICAL DECOMP/DISCECTOMY FUSION  2000's  . AORTIC VALVE REPLACEMENT  08/29/2010   25 mm CE Magna bioprosthetic - [facility]  . APPENDECTOMY  1951  . CARDIAC CATHETERIZATION  10/20/2003   Est. EF of 65% -- Critical disease in the mid to distal circumflex --  Preserved left ventricular  function --   . CARDIAC VALVE REPLACEMENT    . CAROTID ENDARTERECTOMY  05/05/2003   right carotid endarterectomy  . CERVICAL DISC SURGERY  1980's  . CORONARY ANGIOPLASTY WITH STENT PLACEMENT  10/20/2003   Percutaneous coronary intervention/drug-eluding stent implantation, third marginal branch --  Percutaneous closure, right femoral artery -- Atherosclerotic coronary vascular disease, single vessel / Status post successful percutaneous coronary intervention/tandem drug -- eluding stent implantation proximal third marginal branch -- Typical angina was not reproduced with device insertion or balloon   . CORONARY ARTERY BYPASS GRAFT  08/29/2010   CABG X2; SVG to PDA, SVG to [place].  [name] HEMATOMA EVACUATION  05/09/2003   Evacuation of hematoma, right neck  . LEFT HEART CATHETERIZATION WITH CORONARY ANGIOGRAM N/A 03/27/2012   Procedure: LEFT HEART CATHETERIZATION WITH CORONARY ANGIOGRAM;  Surgeon: Peter M Martinique, MD;  Location: [facility] CATH LAB;  Service: Cardiovascular;  Laterality: N/A;  . LUMBAR [place]  . MITRAL VALVE REPAIR  08/23/2010   28 mm Simulus ring -[facility]  . POSTERIOR FUSION LUMBAR SPINE  1997  . PROSTATE BIOPSY    . SHOULDER ARTHROSCOPY  01/07/2007   Left shoulder impingement, labral tear  . SKIN CANCER EXCISION  1990's; 2012   "left chest & right  nose" (03/26/2012)  . TEE WITHOUT CARDIOVERSION N/A 05/15/2012   Procedure: TRANSESOPHAGEAL ECHOCARDIOGRAM (TEE);  Surgeon: Thayer Headings, MD;  Location: [facility];  Service: Cardiovascular;  Laterality: N/A;  . [place]  . TRANSURETHRAL RESECTION OF PROSTATE N/A 04/26/2015   Procedure: TRANSURETHRAL RESECTION OF THE PROSTATE WITH GYRUS INSTRUMENTS;  Surgeon: Ardis Hughs, MD;  Location: [facility] ORS;  Service: Urology;  Laterality: N/A;  TURP-BIPOLAR     Current Outpatient Prescriptions  Medication Sig Dispense Refill  . amLODipine (NORVASC) 5 MG tablet Take 2.5 mg by mouth every evening.     [name] apixaban (ELIQUIS) 5 MG TABS tablet Take 1 tablet (5 mg total) by mouth 2 (two) times daily. 60 tablet 0  . FLUoxetine (PROZAC) 20 MG capsule Take 20 mg by mouth at bedtime.     . Insulin Glargine (BASAGLAR KWIKPEN) 100 UNIT/ML SOPN Inject 36 Units into the skin every evening. 18  in the am and 18 in the pm    . Leuprolide Acetate, 6 Month, (LUPRON DEPOT, 42-MONTH, IM) Inject into the muscle.    . Melatonin 3 MG TABS Take 1 tablet by mouth at bedtime.     . metFORMIN (GLUMETZA) 1000 MG (MOD) 24 hr tablet Take 1 tablet (1,000 mg total) by mouth 2 (two) times daily with a meal.    . metoprolol tartrate (LOPRESSOR) 50 MG tablet Take 1 tablet (50 mg total) by mouth 2 (two) times daily. 180 tablet 3  . Multiple Vitamin (MULTIVITAMIN) tablet Take 1 tablet by mouth daily.    . nitroGLYCERIN (NITROSTAT) 0.4 MG SL tablet Place 1 tablet (0.4 mg total) under the tongue every 5 (five) minutes as needed. For chest pain 25 tablet 5  . omeprazole (PRILOSEC OTC) 20 MG tablet Take 1 tab daily by mouth    . rosuvastatin (CRESTOR) 10 MG tablet Take 10 mg by mouth daily.    [name] UNABLE TO FIND Med Name: Estrogen shot every 3-6 months for 2 years    . vitamin C (ASCORBIC ACID) 500 MG tablet Take 500 mg by mouth 2 (two) times daily.      [name] zolpidem (AMBIEN) 10 MG tablet Take 5 mg by mouth at  bedtime as needed for sleep.      No current facility-administered medications for this visit.     Allergies:   Patient has no known allergies.   Social History:  The patient  reports that he quit smoking about 38 years ago. His smoking use included Cigarettes. He has a 115.00 pack-year smoking history. He has never used smokeless tobacco. He reports that he does not drink alcohol or use drugs.   Family History:  The patient's  family history includes Cancer in his daughter and father; Coronary artery disease in his mother; Diabetes in his mother; Heart disease in his mother; Seizures in his daughter;  Stroke in his father.    ROS:  Please see the history of present illness.   All other systems are personally reviewed and negative.    PHYSICAL EXAM: VS:  BP 110/68   Pulse (!) 113   Ht 5\' 6"  (1.676 m)   Wt 172 lb (78 kg)   BMI 27.76 kg/m  , BMI Body mass index is 27.76 kg/m. GEN: Well nourished, well developed, in no acute distress  HEENT: normal  Neck: no JVD, carotid bruits, or masses Cardiac: iRRR; no murmurs, rubs, or gallops,no edema  Respiratory:  clear to auscultation bilaterally, normal work of breathing GI: soft, nontender, nondistended, + BS MS: no deformity or atrophy  Skin: warm and dry  Neuro:  Strength and sensation are intact Psych: euthymic mood, full affect  EKG:  EKG is ordered today. The ekg ordered today is personally reviewed and shows atrial flutter, possibly typical with V rates 113 bpm, nonspecific ST/T changes   Lipid Panel     Component Value Date/Time   CHOL 117 05/10/2012 0229   TRIG 114 05/10/2012 0229   HDL 40 05/10/2012 0229   CHOLHDL 2.9 05/10/2012 0229   VLDL 23 05/10/2012 0229   LDLCALC 54 05/10/2012 0229   personally reviewed   Wt Readings from Last 3 Encounters:  11/24/16 172 lb (78 kg)  11/15/16 173 lb (78.5 kg)  04/28/16 170 lb 9.6 oz (77.4 kg)      Other studies personally reviewed: Additional studies/ records that were  reviewed today include: AF clinic notes, prior tee from 2014, ekg  Review of the above records today demonstrates: as above   ASSESSMENT AND PLAN:  1.  Atrial flutter Typical appearing by ekg however he has had extensive valve surgery and could have an atypical atrial flutter.  He does not have documented atrial fibrillation.  He is symptomatic.  He has had prior stroke and chads2vasc score of 7.  Given vascular amyloid, he is felt to be a poor candidate for long term anticoagulation due to bleeding risks.  I think the best option may be to proceed with TEE guided atrial flutter ablation with plans to stop discontinuation of anticoagulation 30 days post ablation. Therapeutic strategies for atrial flutter including medicine and ablation were discussed in detail with the patient today. Risk, benefits, and alternatives to EP study and radiofrequency ablation were also discussed in detail today. These risks include but are not limited to stroke, bleeding, vascular damage, tamponade, perforation, damage to the heart and other structures, AV block requiring pacemaker, worsening renal function, and death. The patient understands these risk and wishes to proceed.  We will therefore proceed with catheter ablation at the next available time.  carto and anesthesia are requested for the procedure.  Risks of TEE also discussed today  2. Valvular heart disease Continue current medical therapy  3. HTN Stable No change required today  4. CAD No ischemic symptoms No changes today  Current medicines are reviewed at length with the patient today.   The patient does not have concerns regarding his medicines.  The following changes were made today:  none  Labs/ tests ordered today include:  Orders Placed This Encounter  Procedures  . EKG 12-Lead     Signed, Thompson Grayer, MD  11/24/2016 4:26 PM     [address] [place] [address] [phone]  (office) [phone] (fax)

## 2016-12-01 NOTE — Transfer of Care (Signed)
Immediate Anesthesia Transfer of Care Note  Patient: [name]  Procedure(s) Performed: Procedure(s): A-Flutter Ablation (N/A)  Patient Location: Cath Lab  Anesthesia Type:General  Level of Consciousness: awake, alert , oriented and patient cooperative  Airway & Oxygen Therapy: Patient Spontanous Breathing and Patient connected to nasal cannula oxygen  Post-op Assessment: Report given to RN, Post -op Vital signs reviewed and stable and Patient moving all extremities X 4  Post vital signs: Reviewed and stable  Last Vitals:  Vitals:   12/01/16 0950 12/01/16 1244  BP: 138/83 120/80  Pulse: (!) 108 73  Resp: (!) 23 (!) 22  Temp:  (!) 36.1 C  SpO2: 98% 97%    Last Pain:  Vitals:   12/01/16 1244  TempSrc: Temporal  PainSc:          Complications: No apparent anesthesia complications

## 2016-12-01 NOTE — Interval H&P Note (Signed)
History and Physical Interval Note:  12/01/2016 8:03 AM  [name]  has presented today for surgery, with the diagnosis of ATRIAL FLUTTER  The various methods of treatment have been discussed with the patient and family. After consideration of risks, benefits and other options for treatment, the patient has consented to  Procedure(s): TRANSESOPHAGEAL ECHOCARDIOGRAM (TEE) (N/A) as a surgical intervention .  The patient's history has been reviewed, patient examined, no change in status, stable for surgery.  I have reviewed the patient's chart and labs.  Questions were answered to the patient's satisfaction.     [name]

## 2016-12-01 NOTE — Anesthesia Preprocedure Evaluation (Addendum)
Anesthesia Evaluation  Patient identified by MRN, date of birth, ID band Patient awake    Reviewed: Allergy & Precautions, H&P , NPO status , Patient's Chart, lab work & pertinent test results, reviewed documented beta blocker date and time   Airway Mallampati: II  TM Distance: >3 FB Neck ROM: full    Dental  (+) Missing, Dental Advisory Given Lower side teeth missing:   Pulmonary sleep apnea , former smoker,    Pulmonary exam normal breath sounds clear to auscultation       Cardiovascular hypertension, Pt. on medications and Pt. on home beta blockers + angina with exertion + CAD, + Cardiac Stents, + CABG and + Peripheral Vascular Disease  Normal cardiovascular exam+ Valvular Problems/Murmurs  Rhythm:regular Rate:Normal  Avr. MVR   Neuro/Psych PSYCHIATRIC DISORDERS Depression cea CVA    GI/Hepatic Neg liver ROS, GERD  Medicated and Controlled,  Endo/Other  diabetes, Well Controlled, Type 2, Oral Hypoglycemic Agents  Renal/GU Renal disease  negative genitourinary   Musculoskeletal  (+) Arthritis , Osteoarthritis,    Abdominal   Peds  Hematology negative hematology ROS (+) anemia ,   Anesthesia Other Findings   Reproductive/Obstetrics negative OB ROS                            Anesthesia Physical  Anesthesia Plan  ASA: III  Anesthesia Plan: General   Post-op Pain Management:    Induction: Intravenous  PONV Risk Score and Plan: 2 and Ondansetron, Dexamethasone and Treatment may vary due to age or medical condition  Airway Management Planned: LMA  Additional Equipment:   Intra-op Plan:   Post-operative Plan: Extubation in OR  Informed Consent: I have reviewed the patients History and Physical, chart, labs and discussed the procedure including the risks, benefits and alternatives for the proposed anesthesia with the patient or authorized representative who has indicated his/her  understanding and acceptance.   Dental Advisory Given and Dental advisory given  Plan Discussed with: CRNA, Anesthesiologist and Surgeon  Anesthesia Plan Comments:        Anesthesia Quick Evaluation

## 2016-12-01 NOTE — Discharge Instructions (Signed)
Femoral Site Care Refer to this sheet in the next few weeks. These instructions provide you with information about caring for yourself after your procedure. Your health care provider may also give you more specific instructions. Your treatment has been planned according to current medical practices, but problems sometimes occur. Call your health care provider if you have any problems or questions after your procedure. What can I expect after the procedure? After your procedure, it is typical to have the following:  Bruising at the site that usually fades within 1-2 weeks.  Blood collecting in the tissue (hematoma) that may be painful to the touch. It should usually decrease in size and tenderness within 1-2 weeks.  Follow these instructions at home:  Take medicines only as directed by your health care provider.  You may shower 24-48 hours after the procedure or as directed by your health care provider. Remove the bandage (dressing) and gently wash the site with plain soap and water. Pat the area dry with a clean towel. Do not rub the site, because this may cause bleeding.  Do not take baths, swim, or use a hot tub until your health care provider approves.  Check your insertion site every day for redness, swelling, or drainage.  Do not apply powder or lotion to the site.  Limit use of stairs to twice a day for the first 2-3 days or as directed by your health care provider.  Do not squat for the first 2-3 days or as directed by your health care provider.  Do not lift over 10 lb (4.5 kg) for 5 days after your procedure or as directed by your health care provider.  Ask your health care provider when it is okay to: ? Return to work or school. ? Resume usual physical activities or sports. ? Resume sexual activity.  Do not drive home if you are discharged the same day as the procedure. Have someone else drive you.  You may drive 24 hours after the procedure unless otherwise instructed by  your health care provider.  Do not operate machinery or power tools for 24 hours after the procedure or as directed by your health care provider.  If your procedure was done as an outpatient procedure, which means that you went home the same day as your procedure, a responsible adult should be with you for the first 24 hours after you arrive home.  Keep all follow-up visits as directed by your health care provider. This is important. Contact a health care provider if:  You have a fever.  You have chills.  You have increased bleeding from the site. Hold pressure on the site. Get help right away if:  You have unusual pain at the site.  You have redness, warmth, or swelling at the site.  You have drainage (other than a small amount of blood on the dressing) from the site.  The site is bleeding, and the bleeding does not stop after 30 minutes of holding steady pressure on the site.  Your leg or foot becomes pale, cool, tingly, or numb. This information is not intended to replace advice given to you by your health care provider. Make sure you discuss any questions you have with your health care provider. Document Released: 11/07/2013 Document Revised: 08/12/2015 Document Reviewed: 09/23/2013 Elsevier Interactive Patient Education  2018 [employer].    No driving for 7 days. No lifting over 5 lbs for 1 week. No sexual activity for 1 week.  Keep procedure site clean &  dry. If you notice increased pain, swelling, bleeding or pus, call/return!  You may shower, but no soaking baths/hot tubs/pools for 1 week.

## 2016-12-02 NOTE — Anesthesia Postprocedure Evaluation (Signed)
Anesthesia Post Note  Patient: [name]  Procedure(s) Performed: Procedure(s) (LRB): A-Flutter Ablation (N/A)     Patient location during evaluation: PACU Anesthesia Type: General Level of consciousness: sedated and patient cooperative Pain management: pain level controlled Vital Signs Assessment: post-procedure vital signs reviewed and stable Respiratory status: spontaneous breathing Cardiovascular status: stable Anesthetic complications: no    Last Vitals:  Vitals:   12/01/16 1600 12/01/16 1702  BP: 133/78 (!) 156/94  Pulse: 90 89  Resp: 19 (!) 25  Temp:    SpO2: 95% 90%    Last Pain:  Vitals:   12/01/16 1348  TempSrc: Oral  PainSc:                  Nolon Nations

## 2016-12-03 ENCOUNTER — Encounter (HOSPITAL_COMMUNITY): Payer: Self-pay | Admitting: Internal Medicine

## 2016-12-04 ENCOUNTER — Encounter (HOSPITAL_COMMUNITY): Payer: Self-pay | Admitting: Internal Medicine

## 2016-12-05 ENCOUNTER — Ambulatory Visit (HOSPITAL_COMMUNITY)
Admission: RE | Admit: 2016-12-05 | Discharge: 2016-12-05 | Disposition: A | Discharge status: Home / Self Care | Payer: Medicare Other | Source: Ambulatory Visit | Attending: Nurse Practitioner | Admitting: Nurse Practitioner

## 2016-12-05 ENCOUNTER — Encounter (HOSPITAL_COMMUNITY): Payer: Self-pay | Admitting: Nurse Practitioner

## 2016-12-05 VITALS — BP 152/74 | HR 68 | Ht 66.0 in | Wt 175.2 lb

## 2016-12-05 DIAGNOSIS — I4891 Unspecified atrial fibrillation: Secondary | ICD-10-CM | POA: Insufficient documentation

## 2016-12-05 DIAGNOSIS — E8770 Fluid overload, unspecified: Secondary | ICD-10-CM | POA: Insufficient documentation

## 2016-12-05 DIAGNOSIS — Z87891 Personal history of nicotine dependence: Secondary | ICD-10-CM | POA: Diagnosis not present

## 2016-12-05 DIAGNOSIS — Z87442 Personal history of urinary calculi: Secondary | ICD-10-CM | POA: Insufficient documentation

## 2016-12-05 DIAGNOSIS — R0602 Shortness of breath: Secondary | ICD-10-CM | POA: Insufficient documentation

## 2016-12-05 DIAGNOSIS — I10 Essential (primary) hypertension: Secondary | ICD-10-CM | POA: Insufficient documentation

## 2016-12-05 DIAGNOSIS — Z8673 Personal history of transient ischemic attack (TIA), and cerebral infarction without residual deficits: Secondary | ICD-10-CM | POA: Diagnosis not present

## 2016-12-05 DIAGNOSIS — Z794 Long term (current) use of insulin: Secondary | ICD-10-CM | POA: Diagnosis not present

## 2016-12-05 DIAGNOSIS — Z7901 Long term (current) use of anticoagulants: Secondary | ICD-10-CM | POA: Insufficient documentation

## 2016-12-05 DIAGNOSIS — I483 Typical atrial flutter: Secondary | ICD-10-CM | POA: Diagnosis not present

## 2016-12-05 DIAGNOSIS — I6529 Occlusion and stenosis of unspecified carotid artery: Secondary | ICD-10-CM | POA: Diagnosis not present

## 2016-12-05 DIAGNOSIS — R0600 Dyspnea, unspecified: Secondary | ICD-10-CM | POA: Diagnosis not present

## 2016-12-05 DIAGNOSIS — Z833 Family history of diabetes mellitus: Secondary | ICD-10-CM | POA: Insufficient documentation

## 2016-12-05 DIAGNOSIS — R079 Chest pain, unspecified: Secondary | ICD-10-CM | POA: Diagnosis not present

## 2016-12-05 DIAGNOSIS — I2510 Atherosclerotic heart disease of native coronary artery without angina pectoris: Secondary | ICD-10-CM | POA: Diagnosis not present

## 2016-12-05 DIAGNOSIS — Z951 Presence of aortocoronary bypass graft: Secondary | ICD-10-CM | POA: Insufficient documentation

## 2016-12-05 DIAGNOSIS — Z823 Family history of stroke: Secondary | ICD-10-CM | POA: Insufficient documentation

## 2016-12-05 DIAGNOSIS — J984 Other disorders of lung: Secondary | ICD-10-CM | POA: Insufficient documentation

## 2016-12-05 DIAGNOSIS — Z952 Presence of prosthetic heart valve: Secondary | ICD-10-CM | POA: Insufficient documentation

## 2016-12-05 DIAGNOSIS — F329 Major depressive disorder, single episode, unspecified: Secondary | ICD-10-CM | POA: Insufficient documentation

## 2016-12-05 DIAGNOSIS — Z9089 Acquired absence of other organs: Secondary | ICD-10-CM | POA: Diagnosis not present

## 2016-12-05 DIAGNOSIS — Z85828 Personal history of other malignant neoplasm of skin: Secondary | ICD-10-CM | POA: Diagnosis not present

## 2016-12-05 DIAGNOSIS — Z8042 Family history of malignant neoplasm of prostate: Secondary | ICD-10-CM | POA: Insufficient documentation

## 2016-12-05 DIAGNOSIS — E785 Hyperlipidemia, unspecified: Secondary | ICD-10-CM | POA: Insufficient documentation

## 2016-12-05 DIAGNOSIS — E119 Type 2 diabetes mellitus without complications: Secondary | ICD-10-CM | POA: Diagnosis not present

## 2016-12-05 DIAGNOSIS — Z981 Arthrodesis status: Secondary | ICD-10-CM | POA: Insufficient documentation

## 2016-12-05 DIAGNOSIS — K219 Gastro-esophageal reflux disease without esophagitis: Secondary | ICD-10-CM | POA: Insufficient documentation

## 2016-12-05 DIAGNOSIS — Z9889 Other specified postprocedural states: Secondary | ICD-10-CM | POA: Diagnosis not present

## 2016-12-05 DIAGNOSIS — G4733 Obstructive sleep apnea (adult) (pediatric): Secondary | ICD-10-CM | POA: Diagnosis not present

## 2016-12-05 DIAGNOSIS — Z820 Family history of epilepsy and other diseases of the nervous system: Secondary | ICD-10-CM | POA: Insufficient documentation

## 2016-12-05 DIAGNOSIS — Z8546 Personal history of malignant neoplasm of prostate: Secondary | ICD-10-CM | POA: Diagnosis not present

## 2016-12-05 DIAGNOSIS — Z8249 Family history of ischemic heart disease and other diseases of the circulatory system: Secondary | ICD-10-CM | POA: Insufficient documentation

## 2016-12-05 DIAGNOSIS — Z807 Family history of other malignant neoplasms of lymphoid, hematopoietic and related tissues: Secondary | ICD-10-CM | POA: Insufficient documentation

## 2016-12-05 DIAGNOSIS — R05 Cough: Secondary | ICD-10-CM | POA: Diagnosis not present

## 2016-12-05 DIAGNOSIS — Z955 Presence of coronary angioplasty implant and graft: Secondary | ICD-10-CM | POA: Diagnosis not present

## 2016-12-05 LAB — CBC
HEMATOCRIT: 29.5 % — AB (ref 39.0–52.0)
HEMOGLOBIN: 9.4 g/dL — AB (ref 13.0–17.0)
MCH: 28.3 pg (ref 26.0–34.0)
MCHC: 31.9 g/dL (ref 30.0–36.0)
MCV: 88.9 fL (ref 78.0–100.0)
PLATELETS: 220 10*3/uL (ref 150–400)
RBC: 3.32 MIL/uL — AB (ref 4.22–5.81)
RDW: 15.5 % (ref 11.5–15.5)
WBC: 8.8 10*3/uL (ref 4.0–10.5)

## 2016-12-05 LAB — BASIC METABOLIC PANEL
ANION GAP: 8 (ref 5–15)
BUN: 10 mg/dL (ref 6–20)
CO2: 23 mmol/L (ref 22–32)
Calcium: 8.7 mg/dL — ABNORMAL LOW (ref 8.9–10.3)
Chloride: 108 mmol/L (ref 101–111)
Creatinine, Ser: 0.77 mg/dL (ref 0.61–1.24)
GFR calc Af Amer: >60 mL/min (ref >60)
GLUCOSE: 123 mg/dL — AB (ref 65–99)
Potassium: 3.7 mmol/L (ref 3.5–5.1)
Sodium: 139 mmol/L (ref 135–145)

## 2016-12-05 MED ORDER — FUROSEMIDE 40 MG PO TABS: 40.0000 mg | ORAL_TABLET | ORAL | 2 refills | Status: DC | PRN

## 2016-12-05 MED ORDER — POTASSIUM CHLORIDE CRYS ER 20 MEQ PO TBCR: 20.0000 meq | EXTENDED_RELEASE_TABLET | ORAL | 1 refills | Status: DC | PRN

## 2016-12-05 NOTE — Patient Instructions (Signed)
Your physician has recommended you make the following change in your medication:  1)Start Lasix 40mg  once a day for the next 3 days 2)Start Potassium 80meq once a day for the next 3 days

## 2016-12-05 NOTE — Progress Notes (Addendum)
Primary Care Physician: Lavone Orn, MD Referring Physician: Dr. Felipa Eth Cardiologist: Dr. Tana Felts at [facility]( wanting to change his care to [facility] for convenience issues)    [name] is a 81 y.o. male with past cardiac care at [facility] presenting to his PCP  Recently for fatigue and persistent shortness of breath. He was found to be in atrial flutter on Monday. He reports that since June he has not felt like his usual self with weakness in his legs with fatigue and  increase shortness of breath with activities. PCP started  him on eliquis 5 mg bid for chadsvasc score of at least 6. He also increased metoprolol to 50 mg bid from 25 mg am and 50 mg pm.  He was seen in  the afib clinic for f/u of new onset typical atrial flutter. EKG showed v rate of 114 bpm. He feels about the same as from the last several weeks. Does not describe any PND, orthopnea. Weight is up a couple of pounds. Has not noted any increase in ankle edema or abdominal fullness. He is s/p prostate ca treatment, receiving Lupron shots. H/o OSA, intolerant of cpap.  Review of Dr. Laroy Apple last note in April of this year(Epic care everywhere)shows h/o coronary disease status post PCI in 2005 to the circumflex then subsequently 2-vessel cabg with a vein graft to the PDA and a vein graft to OM along with a 28 mm mitral ring annuloplasty and a 25 mm CE [place] magna bioprosthetic aortic valve replacement in June 2012, moderate pulmonary valve regurgitation, prior right carotid endarterectomy, diabetes mellitus, hypertension, hyperlipidemia, and an ischemic stroke in 2014. 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 The decision was made with Dr. Sheila Oats and neurologist  to stop his warfarin based on the risk/ benefit analysis favoring potential for increased intracranial hemorrhage and he was switched to aspirin alone. He was started on warfarin at  time of stroke in 2014 as he met guidelines to take warfarin with new stroke with h/o a bioprosthetic valve.  Pt was referred to Dr. Rayann Heman, and pt had a typical atrial flutter ablation, 9/14. He asked to be seen for increased shortness of breath since procedure. His weight is up 3 lbs and he has trace LLE and feels bloated in his abdomen. The plan is get pt off anticoagulation as soon as he meets his s/p ablation requirements.    Today, he denies symptoms of palpitations, chest pain,  orthopnea, PND, lower extremity edema, dizziness, presyncope, syncope, or neurologic sequela. + for fatigue, shortness of breath. The patient is tolerating medications without difficulties and is otherwise without complaint today.   Past Medical History:  Diagnosis Date  . Anemia   . Anginal pain ([place])   . Atrial flutter ([place])   . Bladder outlet obstruction   . Carotid artery occlusion   . Coronary artery disease    post stents; prior CABG; s/p cath January 2014 with 2 VD, patent SVG to [facility] and patent SVG to PL patent  . Degenerative disk disease   . GERD (gastroesophageal reflux disease)   . History of esophageal stricture    "has it stretched q once in awhile" (03/26/2012"  . Hyperlipidemia   . Hypertension   . Kidney stones   . Major depression   . OSA (obstructive sleep apnea)    "have a mask; haven't used one in years" (03/26/2012)  . Prostate cancer (  [place])   . Skin cancer of nose 2012   "right side" (03/26/2012)  . Skin cancer of trunk    "left chest" (03/26/2012)  . Spinal stenosis   . Stroke ([place]) 2014   NO RESIDUAL PROBLEMS  . Type II diabetes mellitus ([place])   . Valvular heart disease    Past Surgical History:  Procedure Laterality Date  . A-FLUTTER ABLATION N/A 12/01/2016   Procedure: A-Flutter Ablation;  Surgeon: Thompson Grayer, MD;  Location: [place] CV LAB;  Service: Cardiovascular;  Laterality: N/A;  . ADENOIDECTOMY  1956  . ANTERIOR CERVICAL DECOMP/DISCECTOMY FUSION  2000's  . AORTIC  VALVE REPLACEMENT  08/29/2010   25 mm CE Magna bioprosthetic - [facility]  . APPENDECTOMY  1951  . CARDIAC CATHETERIZATION  10/20/2003   Est. EF of 65% -- Critical disease in the mid to distal circumflex --  Preserved left ventricular  function --   . CARDIAC VALVE REPLACEMENT    . CAROTID ENDARTERECTOMY  05/05/2003   right carotid endarterectomy  . CERVICAL DISC SURGERY  1980's  . CORONARY ANGIOPLASTY WITH STENT PLACEMENT  10/20/2003   Percutaneous coronary intervention/drug-eluding stent implantation, third marginal branch --  Percutaneous closure, right femoral artery -- Atherosclerotic coronary vascular disease, single vessel / Status post successful percutaneous coronary intervention/tandem drug -- eluding stent implantation proximal third marginal branch -- Typical angina was not reproduced with device insertion or balloon   . CORONARY ARTERY BYPASS GRAFT  08/29/2010   CABG X2; SVG to PDA, SVG to [place].  [name] HEMATOMA EVACUATION  05/09/2003   Evacuation of hematoma, right neck  . LEFT HEART CATHETERIZATION WITH CORONARY ANGIOGRAM N/A 03/27/2012   Procedure: LEFT HEART CATHETERIZATION WITH CORONARY ANGIOGRAM;  Surgeon: Peter M Martinique, MD;  Location: [facility] CATH LAB;  Service: Cardiovascular;  Laterality: N/A;  . LUMBAR [place]  . MITRAL VALVE REPAIR  08/23/2010   28 mm Simulus ring -[facility]  . POSTERIOR FUSION LUMBAR SPINE  1997  . PROSTATE BIOPSY    . SHOULDER ARTHROSCOPY  01/07/2007   Left shoulder impingement, labral tear  . SKIN CANCER EXCISION  1990's; 2012   "left chest & right nose" (03/26/2012)  . TEE WITHOUT CARDIOVERSION N/A 05/15/2012   Procedure: TRANSESOPHAGEAL ECHOCARDIOGRAM (TEE);  Surgeon: Thayer Headings, MD;  Location: [place];  Service: Cardiovascular;  Laterality: N/A;  . TEE WITHOUT CARDIOVERSION N/A 12/01/2016   Procedure: TRANSESOPHAGEAL ECHOCARDIOGRAM (TEE);  Surgeon: Fay Records, MD;  Location: [facility] ENDOSCOPY;  Service: Cardiovascular;  Laterality: N/A;  .  [place]  . TRANSURETHRAL RESECTION OF PROSTATE N/A 04/26/2015   Procedure: TRANSURETHRAL RESECTION OF THE PROSTATE WITH GYRUS INSTRUMENTS;  Surgeon: Ardis Hughs, MD;  Location: [facility] ORS;  Service: Urology;  Laterality: N/A;  TURP-BIPOLAR    Current Outpatient Prescriptions  Medication Sig Dispense Refill  . amLODipine (NORVASC) 5 MG tablet Take 2.5 mg by mouth at bedtime.     [name] apixaban (ELIQUIS) 5 MG TABS tablet Take 1 tablet (5 mg total) by mouth 2 (two) times daily. 60 tablet 0  . FLUoxetine (PROZAC) 20 MG capsule Take 20 mg by mouth at bedtime.     . insulin lispro protamine-lispro (HUMALOG 75/25 MIX) (75-25) 100 UNIT/ML SUSP injection Inject 18 Units into the skin 2 (two) times daily with a meal.    . Leuprolide Acetate, 6 Month, (LUPRON DEPOT, 23-MONTH, IM) Inject into the muscle. 1 injection every 6 months. Last injection July 2018    .  Melatonin 3 MG TABS Take 1 tablet by mouth at bedtime.     . metFORMIN (GLUMETZA) 1000 MG (MOD) 24 hr tablet Take 1 tablet (1,000 mg total) by mouth 2 (two) times daily with a meal.    . metoprolol tartrate (LOPRESSOR) 50 MG tablet Take 1 tablet (50 mg total) by mouth 2 (two) times daily. 180 tablet 3  . Multiple Vitamin (MULTIVITAMIN) tablet Take 1 tablet by mouth daily.    . nitroGLYCERIN (NITROSTAT) 0.4 MG SL tablet Place 1 tablet (0.4 mg total) under the tongue every 5 (five) minutes as needed. For chest pain 25 tablet 5  . omeprazole (PRILOSEC) 20 MG capsule Take 20 mg by mouth every morning.    . rosuvastatin (CRESTOR) 10 MG tablet Take 10 mg by mouth every morning.     . vitamin C (ASCORBIC ACID) 500 MG tablet Take 500 mg by mouth 2 (two) times daily.      [name] zolpidem (AMBIEN) 10 MG tablet Take 5 mg by mouth at bedtime.     . furosemide (LASIX) 40 MG tablet Take 1 tablet (40 mg total) by mouth as needed for fluid. 10 tablet 2  . potassium chloride SA (K-DUR,KLOR-CON) 20 MEQ tablet Take 1 tablet (20 mEq total) by mouth  as needed (with lasix). 10 tablet 1   No current facility-administered medications for this encounter.     No Known Allergies  Social History   Social History  . Marital status: Married    Spouse name: N/A  . Number of children: N/A  . Years of education: N/A   Occupational History  . Not on file.   Social History Main Topics  . Smoking status: Former Smoker    Packs/day: 5.00    Years: 23.00    Types: Cigarettes    Quit date: 10/01/1978  . Smokeless tobacco: Never Used  . Alcohol use No     Comment: 03/26/2012 "last alcohol was in 1970; never had problem w/it"  . Drug use: No  . Sexual activity: No   Other Topics Concern  . Not on file   Social History Narrative  . No narrative on file    Family History  Problem Relation Age of Onset  . Diabetes Mother   . Coronary artery disease Mother   . Heart disease Mother   . Stroke Father   . Cancer Father        prostate  . Cancer Daughter        lymphoma hodgkins   . Seizures Daughter     ROS- All systems are reviewed and negative except as per the HPI above  Physical Exam: Vitals:   12/05/16 1453  BP: (!) 152/74  Pulse: 68  SpO2: 96%  Weight: 175 lb 3.2 oz (79.5 kg)  Height: 5\' 6"  (1.676 m)   Wt Readings from Last 3 Encounters:  12/05/16 175 lb 3.2 oz (79.5 kg)  12/01/16 170 lb (77.1 kg)  11/24/16 172 lb (78 kg)    Labs: Lab Results  Component Value Date   NA 139 12/05/2016   K 3.7 12/05/2016   CL 108 12/05/2016   CO2 23 12/05/2016   GLUCOSE 123 (H) 12/05/2016   BUN 10 12/05/2016   CREATININE 0.77 12/05/2016   CALCIUM 8.7 (L) 12/05/2016   Lab Results  Component Value Date   INR 0.96 05/10/2012   Lab Results  Component Value Date   CHOL 117 05/10/2012   HDL 40 05/10/2012   LDLCALC 54 05/10/2012  TRIG 114 05/10/2012     GEN- The patient is well appearing, alert and oriented x 3 today.   Head- normocephalic, atraumatic Eyes-  Sclera clear, conjunctiva pink Ears- hearing  intact Oropharynx- clear Neck- supple, no JVP Lymph- no cervical lymphadenopathy Lungs- Clear to ausculation bilaterally, normal work of breathing Heart- Regular rate and rhythm, no murmurs, rubs or gallops, PMI not laterally displaced GI- soft, NT, ND, + BS Extremities- no clubbing, cyanosis, or trace LLE MS- no significant deformity or atrophy Skin- no rash or lesion Psych- euthymic mood, full affect Neuro- strength and sensation are intact  EKG- SR at 68 bpm, IRBBB pr int 120 ms, qrs int 104 ms, qtc 552 ms(discussed with Dr. Rayann Heman, no antiarryhmic on board, no change) Labs bmet ok, CBC, pt mildly anemic unchanged CXR-FINDINGS: The initial frontal examination was repeated after removing a pen from the patient's shirt pocket. The heart size and mediastinal contours are stable status post median sternotomy, CABG and dual cardiac valve replacement. Mild atelectasis or scarring is present at both lung bases. There is no edema, confluent airspace opacity, pleural effusion or pneumothorax. The bones appear unchanged status post lower cervical fusion.  IMPRESSION: No acute findings.  Mild bibasilar atelectasis or scarring.  Echo Results (from care everywhere)- 06/2016-EF 50%, Left atrium 4.5 cm, Mild TR, Mild MR, mitral valve stenosis grade prosthetic MV ring, MV stenosis mean gradient 5 mm hg, aortic valve regurg- grade none,AV stenosis grade bioprosthetic with mean gradient 7 mm Hg, max velocity 1.53ms  TEE-12/01/16-Left ventricle:  LVEF is severely depressed with diffuse hypokinesis.  ------------------------------------------------------------------- Aortic valve:  AV prosthesis opens well Trivial AI.  ------------------------------------------------------------------- Aorta:  MIld fixed atherosclerotic plaquing of aorta. Mild fixed atherosclerotic plaquing of aorta.  ------------------------------------------------------------------- Mitral valve:  MV mildly thickened.  Mild MR  ------------------------------------------------------------------- Left atrium:   No evidence of thrombus in the atrial cavity or appendage.  ------------------------------------------------------------------- Atrial septum:  No PFO by color doppler  ------------------------------------------------------------------- Right ventricle:  RVEF is depressed. RVEF is depressed.  ------------------------------------------------------------------- Pulmonic valve:   PV is mldly thickened Mild PI.  ------------------------------------------------------------------- Tricuspid valve:  TV is mildly thickened. Mild TR.   ------------------------------------------------------------------- Post procedure conclusions Ascending Aorta:  - MIld fixed atherosclerotic plaquing of aorta. Mild fixed   atherosclerotic plaquing of aorta.  ------------------------------------------------------------------- Prepared and Electronically Authenticated by  PDorris Carnes M.D. 2018-09-14T16:38:43  Assessment and Plan: 1.New onset symptomatic typical atrial flutter with RVR S/p atrial flutter ablation, in SR today  2. Chadsvasc score of at least 6 He is at mod to high risk of stroke with current arrhythmia and risk factors For short term, he will continue on eliquis 5 mg bid until 4 weeks after ablation Do not plan to use long term as he has had amyloid on previous brain scans and would put him at risk long term for bleeding of the brain  3. Shortness of breath s/p ablation Discussed with Dr. ARayann Heman TEE revealed severely reduced LV function, (may need future med changes related to this), hopefully getting back in SR will also help to restore more normal function Feel that he is slightly fluid overloaded since the procedure and will diuresis Lasix 40 mg x 3 days with 20 meq K+ Will see f/u in 3 days  F/u Friday  Addendum-9/19-1:45 pm, called pt  to see if any relief from diuretic from  his shortness of breath, and he can see improvement. Will continue with plan and see back on Friday.  DGeroge BasemanCKayleen Memos ANP-C  [facility] [address] [place], [place] [address] [phone]

## 2016-12-06 NOTE — Addendum Note (Signed)
Encounter addended by: Sherran Needs, NP on: 12/06/2016  1:43 PM<BR>    Actions taken: Sign clinical note

## 2016-12-08 ENCOUNTER — Encounter (HOSPITAL_COMMUNITY): Payer: Self-pay | Admitting: Nurse Practitioner

## 2016-12-08 ENCOUNTER — Ambulatory Visit (HOSPITAL_COMMUNITY)
Admission: RE | Admit: 2016-12-08 | Discharge: 2016-12-08 | Disposition: A | Discharge status: Home / Self Care | Payer: Medicare Other | Source: Ambulatory Visit | Attending: Nurse Practitioner | Admitting: Nurse Practitioner

## 2016-12-08 VITALS — BP 130/60 | HR 63 | Ht 66.0 in | Wt 162.8 lb

## 2016-12-08 DIAGNOSIS — Z952 Presence of prosthetic heart valve: Secondary | ICD-10-CM | POA: Diagnosis not present

## 2016-12-08 DIAGNOSIS — R918 Other nonspecific abnormal finding of lung field: Secondary | ICD-10-CM | POA: Insufficient documentation

## 2016-12-08 DIAGNOSIS — R0600 Dyspnea, unspecified: Secondary | ICD-10-CM | POA: Diagnosis not present

## 2016-12-08 DIAGNOSIS — Z8546 Personal history of malignant neoplasm of prostate: Secondary | ICD-10-CM | POA: Insufficient documentation

## 2016-12-08 DIAGNOSIS — Z951 Presence of aortocoronary bypass graft: Secondary | ICD-10-CM | POA: Diagnosis not present

## 2016-12-08 DIAGNOSIS — G4733 Obstructive sleep apnea (adult) (pediatric): Secondary | ICD-10-CM | POA: Insufficient documentation

## 2016-12-08 DIAGNOSIS — I10 Essential (primary) hypertension: Secondary | ICD-10-CM | POA: Insufficient documentation

## 2016-12-08 DIAGNOSIS — Z8673 Personal history of transient ischemic attack (TIA), and cerebral infarction without residual deficits: Secondary | ICD-10-CM | POA: Diagnosis not present

## 2016-12-08 DIAGNOSIS — I700 Atherosclerosis of aorta: Secondary | ICD-10-CM | POA: Insufficient documentation

## 2016-12-08 DIAGNOSIS — R0602 Shortness of breath: Secondary | ICD-10-CM | POA: Diagnosis not present

## 2016-12-08 DIAGNOSIS — Z87891 Personal history of nicotine dependence: Secondary | ICD-10-CM | POA: Diagnosis not present

## 2016-12-08 DIAGNOSIS — K219 Gastro-esophageal reflux disease without esophagitis: Secondary | ICD-10-CM | POA: Insufficient documentation

## 2016-12-08 DIAGNOSIS — E785 Hyperlipidemia, unspecified: Secondary | ICD-10-CM | POA: Insufficient documentation

## 2016-12-08 DIAGNOSIS — Z87442 Personal history of urinary calculi: Secondary | ICD-10-CM | POA: Diagnosis not present

## 2016-12-08 DIAGNOSIS — F329 Major depressive disorder, single episode, unspecified: Secondary | ICD-10-CM | POA: Insufficient documentation

## 2016-12-08 DIAGNOSIS — I2510 Atherosclerotic heart disease of native coronary artery without angina pectoris: Secondary | ICD-10-CM | POA: Diagnosis not present

## 2016-12-08 DIAGNOSIS — Z794 Long term (current) use of insulin: Secondary | ICD-10-CM | POA: Insufficient documentation

## 2016-12-08 DIAGNOSIS — I6529 Occlusion and stenosis of unspecified carotid artery: Secondary | ICD-10-CM | POA: Insufficient documentation

## 2016-12-08 DIAGNOSIS — Z79899 Other long term (current) drug therapy: Secondary | ICD-10-CM | POA: Insufficient documentation

## 2016-12-08 DIAGNOSIS — E119 Type 2 diabetes mellitus without complications: Secondary | ICD-10-CM | POA: Diagnosis not present

## 2016-12-08 DIAGNOSIS — I4891 Unspecified atrial fibrillation: Secondary | ICD-10-CM | POA: Insufficient documentation

## 2016-12-08 DIAGNOSIS — I483 Typical atrial flutter: Secondary | ICD-10-CM | POA: Insufficient documentation

## 2016-12-08 DIAGNOSIS — I340 Nonrheumatic mitral (valve) insufficiency: Secondary | ICD-10-CM | POA: Insufficient documentation

## 2016-12-08 DIAGNOSIS — Z85828 Personal history of other malignant neoplasm of skin: Secondary | ICD-10-CM | POA: Diagnosis not present

## 2016-12-08 DIAGNOSIS — I499 Cardiac arrhythmia, unspecified: Secondary | ICD-10-CM | POA: Insufficient documentation

## 2016-12-08 DIAGNOSIS — Z7901 Long term (current) use of anticoagulants: Secondary | ICD-10-CM | POA: Insufficient documentation

## 2016-12-08 LAB — BASIC METABOLIC PANEL
Anion gap: 7 (ref 5–15)
BUN: 10 mg/dL (ref 6–20)
CHLORIDE: 100 mmol/L — AB (ref 101–111)
CO2: 31 mmol/L (ref 22–32)
CREATININE: 0.9 mg/dL (ref 0.61–1.24)
Calcium: 9 mg/dL (ref 8.9–10.3)
GFR calc non Af Amer: >60 mL/min (ref >60)
Glucose, Bld: 205 mg/dL — ABNORMAL HIGH (ref 65–99)
POTASSIUM: 3.3 mmol/L — AB (ref 3.5–5.1)
Sodium: 138 mmol/L (ref 135–145)

## 2016-12-08 LAB — CBC
HEMATOCRIT: 32.6 % — AB (ref 39.0–52.0)
HEMOGLOBIN: 10.2 g/dL — AB (ref 13.0–17.0)
MCH: 27.8 pg (ref 26.0–34.0)
MCHC: 31.3 g/dL (ref 30.0–36.0)
MCV: 88.8 fL (ref 78.0–100.0)
PLATELETS: 251 10*3/uL (ref 150–400)
RBC: 3.67 MIL/uL — AB (ref 4.22–5.81)
RDW: 15.1 % (ref 11.5–15.5)
WBC: 7.3 10*3/uL (ref 4.0–10.5)

## 2016-12-08 MED ORDER — POTASSIUM CHLORIDE CRYS ER 20 MEQ PO TBCR: 20.0000 meq | EXTENDED_RELEASE_TABLET | Freq: Every day | ORAL | 3 refills | Status: DC

## 2016-12-08 MED ORDER — METOPROLOL SUCCINATE ER 50 MG PO TB24: ORAL_TABLET | ORAL | 6 refills | Status: DC

## 2016-12-08 MED ORDER — FUROSEMIDE 20 MG PO TABS: 20.0000 mg | ORAL_TABLET | Freq: Every day | ORAL | 3 refills | Status: DC

## 2016-12-08 NOTE — Patient Instructions (Addendum)
Your physician has recommended you make the following change in your medication:  1)Start Lasix 20mg  once a day 2)Stop metoprolol tartrate  3)Start Metoprolol succinate 75mg  once a day (1 and 1/2 tablets)  Scheduling will be in touch with you to schedule stress test  Instructions for potassium once results of labs known.

## 2016-12-08 NOTE — Progress Notes (Addendum)
Primary Care Physician: Lavone Orn, MD Referring Physician: Dr. Felipa Eth Cardiologist: Dr. Tana Felts at [facility]( wanting to change his care to [facility] for convenience issues)    [name] is a 81 y.o. male with past cardiac care at [facility] presenting to his PCP 4-6 weeks ago, for fatigue and persistent shortness of breath. He was found to be in atrial flutter. He reports that since June he has not felt like his usual self with weakness in his legs with fatigue and  increase shortness of breath with activities. PCP started  him on eliquis 5 mg bid for chadsvasc score of at least 6. He also increased metoprolol to 50 mg bid from 25 mg am and 50 mg pm.  He was seen in  the afib clinic for f/u of new onset typical atrial flutter, 8/29,. EKG showed v rate of 114 bpm. He felt about the same as from the last several weeks. Does not describe any PND, orthopnea. Weight is up a couple of pounds. Has not noted any increase in ankle edema or abdominal fullness. He is s/p prostate ca treatment, receiving Lupron shots. H/o OSA, intolerant of cpap.  Review of Dr. Laroy Apple last note in April of this year(Epic care everywhere)shows h/o coronary disease status post PCI in 2005 to the circumflex then subsequently 2-vessel cabg with a vein graft to the PDA and a vein graft to OM along with a 28 mm mitral ring annuloplasty and a 25 mm CE [place] magna bioprosthetic aortic valve replacement in June 2012, moderate pulmonary valve regurgitation, prior right carotid endarterectomy, diabetes mellitus, hypertension, hyperlipidemia, and an ischemic stroke in 2014. 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 The decision was made with Dr. Sheila Oats and neurologist  to stop his warfarin based on the risk/ benefit analysis favoring potential for increased intracranial hemorrhage and he was switched to aspirin alone. He was started on warfarin  at time of stroke in 2014 as he met guidelines to take warfarin with new stroke while on asa with h/o a bioprosthetic valve.  Pt was referred to Dr. Rayann Heman, and pt had a typical atrial flutter ablation, 9/14. He asked to be seen for increased shortness of breath since procedure on 9/18. His weightwas up 3 lbs and he has trace LLE and feels bloated in his abdomen. The plan is get pt off anticoagulation as soon as he meets his s/p ablation requirements.  He was started on daily lasix  with plans to f/u in 3 days.  F/u  9/21, he feels much better with diuresis of 13 lbs. But he still notices some fatigue and mild shortness of breath. He does have severely reduced LV function by recent TEE. Am concerned re possible shortness of breath being anginal equivalent as pt had bypass 6 years ago and has not had a stress test since that time.He is staying in [place]. He also has a prolonged qtc, taking both Prozac and Ambien, both known to prolong  QT,states that he cannot do without either drug.  Today, he denies symptoms of palpitations, chest pain,  orthopnea, PND, lower extremity edema, dizziness, presyncope, syncope, or neurologic sequela. + for fatigue, shortness of breath. The patient is tolerating medications without difficulties and is otherwise without complaint today.   Past Medical History:  Diagnosis Date  . Anemia   . Anginal pain ([place])   . Atrial flutter ([place])   . Bladder outlet obstruction   .  Carotid artery occlusion   . Coronary artery disease    post stents; prior CABG; s/p cath January 2014 with 2 VD, patent SVG to [facility] and patent SVG to PL patent  . Degenerative disk disease   . GERD (gastroesophageal reflux disease)   . History of esophageal stricture    "has it stretched q once in awhile" (03/26/2012"  . Hyperlipidemia   . Hypertension   . Kidney stones   . Major depression   . OSA (obstructive sleep apnea)    "have a mask; haven't used one in years" (03/26/2012)  . Prostate cancer ([place])     . Skin cancer of nose 2012   "right side" (03/26/2012)  . Skin cancer of trunk    "left chest" (03/26/2012)  . Spinal stenosis   . Stroke ([place]) 2014   NO RESIDUAL PROBLEMS  . Type II diabetes mellitus ([place])   . Valvular heart disease    Past Surgical History:  Procedure Laterality Date  . A-FLUTTER ABLATION N/A 12/01/2016   Procedure: A-Flutter Ablation;  Surgeon: Thompson Grayer, MD;  Location: [place] CV LAB;  Service: Cardiovascular;  Laterality: N/A;  . ADENOIDECTOMY  1956  . ANTERIOR CERVICAL DECOMP/DISCECTOMY FUSION  2000's  . AORTIC VALVE REPLACEMENT  08/29/2010   25 mm CE Magna bioprosthetic - [facility]  . APPENDECTOMY  1951  . CARDIAC CATHETERIZATION  10/20/2003   Est. EF of 65% -- Critical disease in the mid to distal circumflex --  Preserved left ventricular  function --   . CARDIAC VALVE REPLACEMENT    . CAROTID ENDARTERECTOMY  05/05/2003   right carotid endarterectomy  . CERVICAL DISC SURGERY  1980's  . CORONARY ANGIOPLASTY WITH STENT PLACEMENT  10/20/2003   Percutaneous coronary intervention/drug-eluding stent implantation, third marginal branch --  Percutaneous closure, right femoral artery -- Atherosclerotic coronary vascular disease, single vessel / Status post successful percutaneous coronary intervention/tandem drug -- eluding stent implantation proximal third marginal branch -- Typical angina was not reproduced with device insertion or balloon   . CORONARY ARTERY BYPASS GRAFT  08/29/2010   CABG X2; SVG to PDA, SVG to [place].  [name] HEMATOMA EVACUATION  05/09/2003   Evacuation of hematoma, right neck  . LEFT HEART CATHETERIZATION WITH CORONARY ANGIOGRAM N/A 03/27/2012   Procedure: LEFT HEART CATHETERIZATION WITH CORONARY ANGIOGRAM;  Surgeon: Peter M Martinique, MD;  Location: [facility] CATH LAB;  Service: Cardiovascular;  Laterality: N/A;  . LUMBAR [place]  . MITRAL VALVE REPAIR  08/23/2010   28 mm Simulus ring -[facility]  . POSTERIOR FUSION LUMBAR SPINE  1997  . PROSTATE  BIOPSY    . SHOULDER ARTHROSCOPY  01/07/2007   Left shoulder impingement, labral tear  . SKIN CANCER EXCISION  1990's; 2012   "left chest & right nose" (03/26/2012)  . TEE WITHOUT CARDIOVERSION N/A 05/15/2012   Procedure: TRANSESOPHAGEAL ECHOCARDIOGRAM (TEE);  Surgeon: Thayer Headings, MD;  Location: [place];  Service: Cardiovascular;  Laterality: N/A;  . TEE WITHOUT CARDIOVERSION N/A 12/01/2016   Procedure: TRANSESOPHAGEAL ECHOCARDIOGRAM (TEE);  Surgeon: Fay Records, MD;  Location: [facility] ENDOSCOPY;  Service: Cardiovascular;  Laterality: N/A;  . [place]  . TRANSURETHRAL RESECTION OF PROSTATE N/A 04/26/2015   Procedure: TRANSURETHRAL RESECTION OF THE PROSTATE WITH GYRUS INSTRUMENTS;  Surgeon: Ardis Hughs, MD;  Location: [facility];  Service: Urology;  Laterality: N/A;  TURP-BIPOLAR    Current Outpatient Prescriptions  Medication Sig Dispense Refill  . amLODipine (NORVASC) 5 MG  tablet Take 2.5 mg by mouth at bedtime.     [name] apixaban (ELIQUIS) 5 MG TABS tablet Take 1 tablet (5 mg total) by mouth 2 (two) times daily. 60 tablet 0  . FLUoxetine (PROZAC) 20 MG capsule Take 20 mg by mouth at bedtime.     . furosemide (LASIX) 20 MG tablet Take 1 tablet (20 mg total) by mouth daily. 30 tablet 3  . insulin lispro protamine-lispro (HUMALOG 75/25 MIX) (75-25) 100 UNIT/ML SUSP injection Inject 18 Units into the skin 2 (two) times daily with a meal.    . Leuprolide Acetate, 6 Month, (LUPRON DEPOT, 19-MONTH, IM) Inject into the muscle. 1 injection every 6 months. Last injection July 2018    . Melatonin 3 MG TABS Take 1 tablet by mouth at bedtime.     . metFORMIN (GLUMETZA) 1000 MG (MOD) 24 hr tablet Take 1 tablet (1,000 mg total) by mouth 2 (two) times daily with a meal.    . Multiple Vitamin (MULTIVITAMIN) tablet Take 1 tablet by mouth daily.    . nitroGLYCERIN (NITROSTAT) 0.4 MG SL tablet Place 1 tablet (0.4 mg total) under the tongue every 5 (five) minutes as needed. For  chest pain 25 tablet 5  . omeprazole (PRILOSEC) 20 MG capsule Take 20 mg by mouth every morning.    . potassium chloride SA (K-DUR,KLOR-CON) 20 MEQ tablet Take 1 tablet (20 mEq total) by mouth daily. 30 tablet 3  . rosuvastatin (CRESTOR) 10 MG tablet Take 10 mg by mouth every morning.     . vitamin C (ASCORBIC ACID) 500 MG tablet Take 500 mg by mouth 2 (two) times daily.      [name] zolpidem (AMBIEN) 10 MG tablet Take 5 mg by mouth at bedtime.     . metoprolol succinate (TOPROL XL) 50 MG 24 hr tablet Take 1 and 1/2 tablet (20m) by mouth once a day 45 tablet 6   No current facility-administered medications for this encounter.     No Known Allergies  Social History   Social History  . Marital status: Married    Spouse name: N/A  . Number of children: N/A  . Years of education: N/A   Occupational History  . Not on file.   Social History Main Topics  . Smoking status: Former Smoker    Packs/day: 5.00    Years: 23.00    Types: Cigarettes    Quit date: 10/01/1978  . Smokeless tobacco: Never Used  . Alcohol use No     Comment: 03/26/2012 "last alcohol was in 1970; never had problem w/it"  . Drug use: No  . Sexual activity: No   Other Topics Concern  . Not on file   Social History Narrative  . No narrative on file    Family History  Problem Relation Age of Onset  . Diabetes Mother   . Coronary artery disease Mother   . Heart disease Mother   . Stroke Father   . Cancer Father        prostate  . Cancer Daughter        lymphoma hodgkins   . Seizures Daughter     ROS- All systems are reviewed and negative except as per the HPI above  Physical Exam: Vitals:   12/08/16 1137  BP: 130/60  Pulse: 63  Weight: 162 lb 12.8 oz (73.8 kg)  Height: '5\' 6"'  (1.676 m)   Wt Readings from Last 3 Encounters:  12/08/16 162 lb 12.8 oz (73.8 kg)  12/05/16  175 lb 3.2 oz (79.5 kg)  12/01/16 170 lb (77.1 kg)    Labs: Lab Results  Component Value Date   NA 138 12/08/2016   K 3.3 (L)  12/08/2016   CL 100 (L) 12/08/2016   CO2 31 12/08/2016   GLUCOSE 205 (H) 12/08/2016   BUN 10 12/08/2016   CREATININE 0.90 12/08/2016   CALCIUM 9.0 12/08/2016   Lab Results  Component Value Date   INR 0.96 05/10/2012   Lab Results  Component Value Date   CHOL 117 05/10/2012   HDL 40 05/10/2012   LDLCALC 54 05/10/2012   TRIG 114 05/10/2012     GEN- The patient is well appearing, alert and oriented x 3 today.   Head- normocephalic, atraumatic Eyes-  Sclera clear, conjunctiva pink Ears- hearing intact Oropharynx- clear Neck- supple, no JVP Lymph- no cervical lymphadenopathy Lungs- Clear to ausculation bilaterally, normal work of breathing Heart- Regular rate and rhythm, + 2/6 sys murmur murmurs, rubs or gallops, PMI not laterally displaced GI- soft, NT, ND, + BS Extremities- no clubbing, cyanosis  MS- no significant deformity or atrophy Skin- no rash or lesion Psych- euthymic mood, full affect Neuro- strength and sensation are intact  EKG- SR at 63 bpm, IRBBB pr int 152 ms, qrs int 98 ms, qtc 548 ms(discussed with Dr. Rayann Heman, no antiarryhmic on board,  known qtc prolonging drugs)    CXR-FINDINGS: The initial frontal examination was repeated after removing a pen from the patient's shirt pocket. The heart size and mediastinal contours are stable status post median sternotomy, CABG and dual cardiac valve replacement. Mild atelectasis or scarring is present at both lung bases. There is no edema, confluent airspace opacity, pleural effusion or pneumothorax. The bones appear unchanged status post lower cervical fusion.  IMPRESSION: No acute findings.  Mild bibasilar atelectasis or scarring.  Echo Results (from care everywhere)- 06/2016-EF 50%, Left atrium 4.5 cm, Mild TR, Mild MR, mitral valve stenosis grade prosthetic MV ring, MV stenosis mean gradient 5 mm hg, aortic valve regurg- grade none,AV stenosis grade bioprosthetic with mean gradient 7 mm Hg, max velocity  1.47ms  TEE-12/01/16-Left ventricle:  LVEF is severely depressed with diffuse hypokinesis.  ------------------------------------------------------------------- Aortic valve:  AV prosthesis opens well Trivial AI.  ------------------------------------------------------------------- Aorta:  MIld fixed atherosclerotic plaquing of aorta. Mild fixed atherosclerotic plaquing of aorta.  ------------------------------------------------------------------- Mitral valve:  MV mildly thickened. Mild MR  ------------------------------------------------------------------- Left atrium:   No evidence of thrombus in the atrial cavity or appendage.  ------------------------------------------------------------------- Atrial septum:  No PFO by color doppler  ------------------------------------------------------------------- Right ventricle:  RVEF is depressed. RVEF is depressed.  ------------------------------------------------------------------- Pulmonic valve:   PV is mldly thickened Mild PI.  ------------------------------------------------------------------- Tricuspid valve:  TV is mildly thickened. Mild TR.   ------------------------------------------------------------------- Post procedure conclusions Ascending Aorta:  - MIld fixed atherosclerotic plaquing of aorta. Mild fixed   atherosclerotic plaquing of aorta.  ------------------------------------------------------------------- Prepared and Electronically Authenticated by  PDorris Carnes M.D. 2018-09-14T16:38:43  Assessment and Plan: 1.New onset symptomatic typical atrial flutter with RVR S/p atrial flutter ablation, in SR today  2. Chadsvasc score of at least 6 He is at mod to high risk of stroke with current arrhythmia and risk factors For short term, he will continue on eliquis 5 mg bid until 4 weeks after ablation Do not plan to use long term as he has had amyloid on previous brain scans and would put him at risk   for bleeding of the brain, if used long term  3. Shortness  of breath/LV dysfunction TEE revealed severely reduced LV function, hopefully getting back in SR will also help to restore more normal function Diuresis has helped shortness of breath but sill present Continue lasix at 20 mg a day, K+ at 20 meq a day Will change metoprolol tartrate 50 mg bid to 75 mg succinate to help with LV dysfunction Will need ace in future and other meds for LV dysfunction when able Will schedule for lexi Myoview as I am concerned his shortness of breath on exertion may be anginal equivalent as he has new T wave changes and CABG was 6 years ago with no f/u functional studies  4. Prolonged qtc Advised that Ambien and Prozac both prolong the qtc and pt states he can not do without either drug, he was advised to talk to PCP to see if he could use an alternative for either or both drugs to help with prolonged qtc.   F/u with Dr. Rayann Heman 10/8 and hopefully will have stress test in next week Afib clinic as needed  [place]. Jodell Weitman, [facility] [address] [place], [place] [address] [phone]

## 2016-12-11 ENCOUNTER — Telehealth (HOSPITAL_COMMUNITY): Payer: Self-pay | Admitting: *Deleted

## 2016-12-11 NOTE — Telephone Encounter (Signed)
Patient given detailed instructions per Myocardial Perfusion Study Information Sheet for the test on 12/13/16. Patient notified to arrive 15 minutes early and that it is imperative to arrive on time for appointment to keep from having the test rescheduled.  If you need to cancel or reschedule your appointment, please call the office within 24 hours of your appointment. . Patient verbalized understanding. Kirstie Peri

## 2016-12-12 NOTE — Addendum Note (Signed)
Encounter addended by: Sherran Needs, NP on: 12/12/2016  2:58 PM<BR>    Actions taken: Sign clinical note

## 2016-12-13 ENCOUNTER — Ambulatory Visit (HOSPITAL_COMMUNITY): Payer: Medicare Other | Attending: Internal Medicine

## 2016-12-13 DIAGNOSIS — R0600 Dyspnea, unspecified: Secondary | ICD-10-CM | POA: Insufficient documentation

## 2016-12-13 LAB — MYOCARDIAL PERFUSION IMAGING
CSEPPHR: 76 {beats}/min
LV sys vol: 68 mL
LVDIAVOL: 133 mL (ref 62–150)
NUC STRESS TID: 1.02
RATE: 0.28
Rest HR: 66 {beats}/min
SDS: 2
SRS: 1
SSS: 3

## 2016-12-13 MED ORDER — TECHNETIUM TC 99M TETROFOSMIN IV KIT
10.9000 | PACK | Freq: Once | INTRAVENOUS | Status: AC | PRN
  Administered 2016-12-13: 10.9 via INTRAVENOUS
  Filled 2016-12-13: qty 11

## 2016-12-13 MED ORDER — REGADENOSON 0.4 MG/5ML IV SOLN
0.4000 mg | Freq: Once | INTRAVENOUS | Status: AC
  Administered 2016-12-13: 0.4 mg via INTRAVENOUS

## 2016-12-13 MED ORDER — TECHNETIUM TC 99M TETROFOSMIN IV KIT
32.4000 | PACK | Freq: Once | INTRAVENOUS | Status: AC | PRN
  Administered 2016-12-13: 32.4 via INTRAVENOUS
  Filled 2016-12-13: qty 33

## 2016-12-15 NOTE — Addendum Note (Signed)
Encounter addended by: Sherran Needs, NP on: 12/15/2016  3:14 PM<BR>    Actions taken: LOS modified, Visit diagnoses modified

## 2016-12-20 ENCOUNTER — Other Ambulatory Visit (HOSPITAL_COMMUNITY): Payer: Self-pay | Admitting: *Deleted

## 2016-12-20 MED ORDER — APIXABAN 5 MG PO TABS: 5.0000 mg | ORAL_TABLET | Freq: Two times a day (BID) | ORAL | 3 refills | Status: DC

## 2016-12-20 MED ORDER — POTASSIUM CHLORIDE CRYS ER 20 MEQ PO TBCR
20.0000 meq | EXTENDED_RELEASE_TABLET | Freq: Every day | ORAL | 3 refills | Status: DC
Start: 2016-12-20 — End: 2022-09-19

## 2016-12-25 ENCOUNTER — Encounter: Payer: Self-pay | Admitting: Internal Medicine

## 2016-12-25 ENCOUNTER — Ambulatory Visit (INDEPENDENT_AMBULATORY_CARE_PROVIDER_SITE_OTHER): Payer: Medicare Other | Admitting: Internal Medicine

## 2016-12-25 VITALS — BP 102/58 | HR 76 | Ht 66.0 in | Wt 164.2 lb

## 2016-12-25 DIAGNOSIS — I428 Other cardiomyopathies: Secondary | ICD-10-CM

## 2016-12-25 DIAGNOSIS — R0609 Other forms of dyspnea: Secondary | ICD-10-CM | POA: Diagnosis not present

## 2016-12-25 DIAGNOSIS — I38 Endocarditis, valve unspecified: Secondary | ICD-10-CM

## 2016-12-25 DIAGNOSIS — I5023 Acute on chronic systolic (congestive) heart failure: Secondary | ICD-10-CM | POA: Diagnosis not present

## 2016-12-25 DIAGNOSIS — I483 Typical atrial flutter: Secondary | ICD-10-CM | POA: Diagnosis not present

## 2016-12-25 MED ORDER — METOPROLOL SUCCINATE ER 25 MG PO TB24: 25.0000 mg | ORAL_TABLET | Freq: Every day | ORAL | 6 refills | Status: DC

## 2016-12-25 MED ORDER — METOPROLOL SUCCINATE ER 50 MG PO TB24: 50.0000 mg | ORAL_TABLET | Freq: Every day | ORAL | 3 refills | Status: DC

## 2016-12-25 MED ORDER — LOSARTAN POTASSIUM 25 MG PO TABS: 25.0000 mg | ORAL_TABLET | Freq: Every day | ORAL | 3 refills | Status: DC

## 2016-12-25 NOTE — Progress Notes (Signed)
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  Since his recent atrial flutter ablation, the patient reports doing reasonably well. He denies procedure related complications.  He had CHF post ablatio which has slowly improved.  Exercise tolerance is "much better" in sinus than in atrial flutter, though he continues to have mild SOB.   Today, he denies symptoms of palpitations, chest pain, lower extremity edema, presyncope, or syncope.  + vertiginous dizziness--> I have advised that he see primary care for this.   BP has also been lower lately though he denies postural dizziness. The patient is otherwise without complaint today.   Past Medical History:  Diagnosis Date  . Anemia   . Anginal pain ([place])   . Atrial flutter ([place])   . Bladder outlet obstruction   . Carotid artery occlusion   . Coronary artery disease    post stents; prior CABG; s/p cath January 2014 with 2 VD, patent SVG to [facility] and patent SVG to PL patent  . Degenerative disk disease   . GERD (gastroesophageal reflux disease)   . History of esophageal stricture    "has it stretched q once in awhile" (03/26/2012"  . Hyperlipidemia   . Hypertension   . Kidney stones   . Major depression   . OSA (obstructive sleep apnea)    "have a mask; haven't used one in years" (03/26/2012)  . Prostate cancer ([place])   . Skin cancer of nose 2012   "right side" (03/26/2012)  . Skin cancer of trunk    "left chest" (03/26/2012)  . Spinal stenosis   . Stroke ([place]) 2014   NO RESIDUAL PROBLEMS  . Type II diabetes mellitus ([place])   . Valvular heart disease    Past Surgical History:  Procedure Laterality Date  . A-FLUTTER ABLATION N/A 12/01/2016   Procedure: A-Flutter Ablation;  Surgeon: Thompson Grayer, MD;  Location: [place] CV LAB;  Service: Cardiovascular;  Laterality: N/A;  . ADENOIDECTOMY  1956  . ANTERIOR  CERVICAL DECOMP/DISCECTOMY FUSION  2000's  . AORTIC VALVE REPLACEMENT  08/29/2010   25 mm CE Magna bioprosthetic - [facility]  . APPENDECTOMY  1951  . CARDIAC CATHETERIZATION  10/20/2003   Est. EF of 65% -- Critical disease in the mid to distal circumflex --  Preserved left ventricular  function --   . CARDIAC VALVE REPLACEMENT    . CAROTID ENDARTERECTOMY  05/05/2003   right carotid endarterectomy  . CERVICAL DISC SURGERY  1980's  . CORONARY ANGIOPLASTY WITH STENT PLACEMENT  10/20/2003   Percutaneous coronary intervention/drug-eluding stent implantation, third marginal branch --  Percutaneous closure, right femoral artery -- Atherosclerotic coronary vascular disease, single vessel / Status post successful percutaneous coronary intervention/tandem drug -- eluding stent implantation proximal third marginal branch -- Typical angina was not reproduced with device insertion or balloon   . CORONARY ARTERY BYPASS GRAFT  08/29/2010   CABG X2; SVG to PDA, SVG to [place].  [name] HEMATOMA EVACUATION  05/09/2003   Evacuation of hematoma, right neck  . LEFT HEART CATHETERIZATION WITH CORONARY ANGIOGRAM N/A 03/27/2012   Procedure: LEFT HEART CATHETERIZATION WITH CORONARY ANGIOGRAM;  Surgeon: Peter M Martinique, MD;  Location: [facility] CATH LAB;  Service: Cardiovascular;  Laterality: N/A;  . LUMBAR [place]  . MITRAL VALVE REPAIR  08/23/2010   28 mm Simulus ring -[facility]  . POSTERIOR FUSION LUMBAR SPINE  [place]    . SHOULDER ARTHROSCOPY  01/07/2007   Left shoulder impingement, labral tear  . SKIN CANCER EXCISION  1990's; 2012   "left chest & right nose" (03/26/2012)  . TEE WITHOUT CARDIOVERSION N/A 05/15/2012   Procedure: TRANSESOPHAGEAL ECHOCARDIOGRAM (TEE);  Surgeon: [place] Headings, MD;  Location: [place];  Service: Cardiovascular;  Laterality: N/A;  . TEE WITHOUT CARDIOVERSION N/A 12/01/2016   Procedure: TRANSESOPHAGEAL ECHOCARDIOGRAM (TEE);  Surgeon: Fay Records, MD;  Location: [facility]  ENDOSCOPY;  Service: Cardiovascular;  Laterality: N/A;  . [place]  . TRANSURETHRAL RESECTION OF PROSTATE N/A 04/26/2015   Procedure: TRANSURETHRAL RESECTION OF THE PROSTATE WITH GYRUS INSTRUMENTS;  Surgeon: Ardis Hughs, MD;  [facility];  Service: Urology;  Laterality: N/A;  TURP-BIPOLAR    ROS- all systems are reviewed and negatives except as per HPI above  Current Outpatient Prescriptions  Medication Sig Dispense Refill  . amLODipine (NORVASC) 5 MG tablet Take 2.5 mg by mouth at bedtime.     [name] apixaban (ELIQUIS) 5 MG TABS tablet Take 1 tablet (5 mg total) by mouth 2 (two) times daily. 60 tablet 3  . FLUoxetine (PROZAC) 20 MG capsule Take 20 mg by mouth at bedtime.     . furosemide (LASIX) 20 MG tablet Take 1 tablet (20 mg total) by mouth daily. 30 tablet 3  . insulin lispro protamine-lispro (HUMALOG 75/25 MIX) (75-25) 100 UNIT/ML SUSP injection Inject 18 Units into the skin 2 (two) times daily with a meal.    . Leuprolide Acetate, 6 Month, (LUPRON DEPOT, 59-MONTH, IM) Inject into the muscle as directed. 1 injection every 6 months. Last injection July 2018    . Melatonin 3 MG TABS Take 1 tablet by mouth at bedtime.     . metFORMIN (GLUMETZA) 1000 MG (MOD) 24 hr tablet Take 1 tablet (1,000 mg total) by mouth 2 (two) times daily with a meal.    . metoprolol succinate (TOPROL XL) 50 MG 24 hr tablet Take 1 and 1/2 tablet (75mg ) by mouth once a day 45 tablet 6  . Multiple Vitamin (MULTIVITAMIN) tablet Take 1 tablet by mouth daily.    . nitroGLYCERIN (NITROSTAT) 0.4 MG SL tablet Place 1 tablet (0.4 mg total) under the tongue every 5 (five) minutes as needed. For chest pain 25 tablet 5  . omeprazole (PRILOSEC) 20 MG capsule Take 20 mg by mouth every morning.    . potassium chloride SA (K-DUR,KLOR-CON) 20 MEQ tablet Take 1 tablet (20 mEq total) by mouth daily. 30 tablet 3  . rosuvastatin (CRESTOR) 10 MG tablet Take 10 mg by mouth every morning.     . vitamin C  (ASCORBIC ACID) 500 MG tablet Take 500 mg by mouth 2 (two) times daily.      [name] zolpidem (AMBIEN) 10 MG tablet Take 5 mg by mouth at bedtime.      No current facility-administered medications for this visit.     Physical Exam: Vitals:   12/25/16 1409  BP: (!) 102/58  Pulse: 76  SpO2: 96%  Weight: 164 lb 3.2 oz (74.5 kg)  Height: 5\' 6"  (1.676 m)    GEN- The patient is well appearing, alert and oriented x 3 today.   Head- normocephalic, atraumatic Eyes-  Sclera clear, conjunctiva pink Ears- hearing intact Oropharynx- clear Lungs- Clear to ausculation bilaterally, normal work of breathing Heart- Regular rate and rhythm  GI- soft, NT, ND, + BS Extremities- no clubbing, cyanosis,trace edema  EKG tracing ordered today is personally reviewed and shows sinus rhythm 76 bpm, PR 180 msec, QRS 102 msec, QTc 461 msec, otherwise normal ekg  Assessment and Plan:  1. Isthmus dependant right atrial flutter Resolved s/p ablation Stop eliquis 12/31/16 Given amyloid angiopathy, he is at risks with long term anticoagulation  2. Nonischemic CM Severe LV dysfunction by TEE 12/01/16 Hopefully will resolve s/p ablation Would advise close follow-up with general cardiology Repeat echo after 3 months of optimization and post ablation. Stop norvasc and reduce toprol to 50mg  daily to allow for addition of losartan for CHF remodelling He has scheduled follow-up with [facility] in a few weeks Will need repeat echo in 3 months or so  2. Valvular heart disease Continue medical therapy  3. HTN Low today Stop norvasc Reduce metoprolol to 50mg  daily Add losartan as above for CHF  4. QT prolongation Improved Avoid QT prolonging medicines in the future  5. CAD No ischemic symptoms If EF does not recover with above, may require further CV testing  Follow-up with [facility] I will see as needed going forward  Thompson Grayer MD, [facility] 12/25/2016 2:31 PM

## 2016-12-25 NOTE — Patient Instructions (Signed)
Medication Instructions:  Your physician has recommended you make the following change in your medication:   1.)  Stop Eliquis on 12/31/16  2.) Decrease Toprol to 50mg  daily  3.) Stop Norvasc  4.) Start Losartan 25mg  daily    -- If you need a refill on your cardiac medications before your next appointment, please call your pharmacy. --  Labwork: Your physician recommends that you return for lab work today:  BMP/CBC   Testing/Procedures: None ordered  Follow-Up: Your physician wants you to follow-up as needed.  Thank you for choosing [facility]!!   Frederik Schmidt, RN [phone]  Any Other Special Instructions Will Be Listed Below (If Applicable).

## 2016-12-26 LAB — CBC WITH DIFFERENTIAL/PLATELET
Basophils Absolute: 0 10*3/uL (ref 0.0–0.2)
Basos: 0 %
EOS (ABSOLUTE): 0.3 10*3/uL (ref 0.0–0.4)
Eos: 3 %
Hematocrit: 34 % — ABNORMAL LOW (ref 37.5–51.0)
Hemoglobin: 10.6 g/dL — ABNORMAL LOW (ref 13.0–17.7)
Immature Grans (Abs): 0.1 10*3/uL (ref 0.0–0.1)
Immature Granulocytes: 1 %
Lymphocytes Absolute: 1.1 10*3/uL (ref 0.7–3.1)
Lymphs: 11 %
MCH: 28.3 pg (ref 26.6–33.0)
MCHC: 31.2 g/dL — ABNORMAL LOW (ref 31.5–35.7)
MCV: 91 fL (ref 79–97)
Monocytes Absolute: 1 10*3/uL — ABNORMAL HIGH (ref 0.1–0.9)
Monocytes: 10 %
Neutrophils Absolute: 7.1 10*3/uL — ABNORMAL HIGH (ref 1.4–7.0)
Neutrophils: 75 %
Platelets: 310 10*3/uL (ref 150–379)
RBC: 3.74 x10E6/uL — ABNORMAL LOW (ref 4.14–5.80)
RDW: 15.2 % (ref 12.3–15.4)
WBC: 9.6 10*3/uL (ref 3.4–10.8)

## 2016-12-26 LAB — BASIC METABOLIC PANEL
BUN / CREAT RATIO: 15 (ref 10–24)
BUN: 23 mg/dL (ref 8–27)
CALCIUM: 9.7 mg/dL (ref 8.6–10.2)
CHLORIDE: 103 mmol/L (ref 96–106)
CO2: 24 mmol/L (ref 20–29)
CREATININE: 1.5 mg/dL — AB (ref 0.76–1.27)
GFR calc non Af Amer: 44 mL/min/{1.73_m2} — ABNORMAL LOW (ref >59)
GFR, EST AFRICAN AMERICAN: 51 mL/min/{1.73_m2} — AB (ref >59)
Glucose: 143 mg/dL — ABNORMAL HIGH (ref 65–99)
Potassium: 5 mmol/L (ref 3.5–5.2)
Sodium: 140 mmol/L (ref 134–144)

## 2016-12-28 ENCOUNTER — Telehealth: Payer: Self-pay | Admitting: *Deleted

## 2016-12-28 ENCOUNTER — Other Ambulatory Visit: Payer: Self-pay | Admitting: *Deleted

## 2016-12-28 MED ORDER — FUROSEMIDE 20 MG PO TABS: 20.0000 mg | ORAL_TABLET | Freq: Every day | ORAL | 3 refills | Status: DC | PRN

## 2016-12-28 NOTE — Telephone Encounter (Signed)
-----   Message from Thompson Grayer, MD sent at 12/27/2016 10:15 AM EDT ----- Results reviewed.  [name], please inform pt of result.  Elevation of creatinine suggests we probably need to stop lasix.  Please instruct on daily weights, salt restriction, and change lasix to 20mg  daily PRN. I will route to primary care also.

## 2016-12-28 NOTE — Telephone Encounter (Signed)
PT IS AWARE OF RESULTS ALONG WITH STOPPING LASIX DAILY  AND TAKING AS NEEDED.  PT  WAS TOLD TO WATCH WEIGHT AND MONITOR IF 3 LBS IN 24 HOURS  OR 5 LBS IN  A WEEK TO KNOW IF YOU NEED TO TAKE SOME LASIX.   ALONG WITH MONITORING  SALT INTAKE IN DIET EATING WITH MODERATIONS.  PT WANT DR ALLRED TO LET HIM KNOW IF HE CAN STOP HIS K+ SINCE HIS LEVEL ARE  WNL/.  PT WAS TOLD THAT DR ALLRED  NURSE WILL BE CONTACTED TO SEE ABOUT THAT.

## 2017-01-04 DIAGNOSIS — Z23 Encounter for immunization: Secondary | ICD-10-CM | POA: Diagnosis not present

## 2017-01-08 DIAGNOSIS — I2584 Coronary atherosclerosis due to calcified coronary lesion: Secondary | ICD-10-CM | POA: Diagnosis not present

## 2017-01-08 DIAGNOSIS — Z9889 Other specified postprocedural states: Secondary | ICD-10-CM | POA: Diagnosis not present

## 2017-01-08 DIAGNOSIS — Z953 Presence of xenogenic heart valve: Secondary | ICD-10-CM | POA: Diagnosis not present

## 2017-01-08 DIAGNOSIS — Z8679 Personal history of other diseases of the circulatory system: Secondary | ICD-10-CM | POA: Diagnosis not present

## 2017-01-08 DIAGNOSIS — I2510 Atherosclerotic heart disease of native coronary artery without angina pectoris: Secondary | ICD-10-CM | POA: Diagnosis not present

## 2017-01-08 DIAGNOSIS — Z951 Presence of aortocoronary bypass graft: Secondary | ICD-10-CM | POA: Diagnosis not present

## 2017-01-08 DIAGNOSIS — I10 Essential (primary) hypertension: Secondary | ICD-10-CM | POA: Diagnosis not present

## 2017-01-08 DIAGNOSIS — Z952 Presence of prosthetic heart valve: Secondary | ICD-10-CM | POA: Diagnosis not present

## 2017-01-09 DIAGNOSIS — Z8546 Personal history of malignant neoplasm of prostate: Secondary | ICD-10-CM | POA: Diagnosis not present

## 2017-01-11 DIAGNOSIS — B029 Zoster without complications: Secondary | ICD-10-CM | POA: Diagnosis not present

## 2017-01-11 DIAGNOSIS — L814 Other melanin hyperpigmentation: Secondary | ICD-10-CM | POA: Diagnosis not present

## 2017-01-11 DIAGNOSIS — Z85828 Personal history of other malignant neoplasm of skin: Secondary | ICD-10-CM | POA: Diagnosis not present

## 2017-02-15 DIAGNOSIS — L0390 Cellulitis, unspecified: Secondary | ICD-10-CM | POA: Diagnosis not present

## 2017-03-26 DIAGNOSIS — I371 Nonrheumatic pulmonary valve insufficiency: Secondary | ICD-10-CM | POA: Diagnosis not present

## 2017-03-26 DIAGNOSIS — Z79899 Other long term (current) drug therapy: Secondary | ICD-10-CM | POA: Diagnosis not present

## 2017-03-26 DIAGNOSIS — Z8679 Personal history of other diseases of the circulatory system: Secondary | ICD-10-CM | POA: Diagnosis not present

## 2017-03-26 DIAGNOSIS — I088 Other rheumatic multiple valve diseases: Secondary | ICD-10-CM | POA: Diagnosis not present

## 2017-03-26 DIAGNOSIS — I2584 Coronary atherosclerosis due to calcified coronary lesion: Secondary | ICD-10-CM | POA: Diagnosis not present

## 2017-03-26 DIAGNOSIS — I10 Essential (primary) hypertension: Secondary | ICD-10-CM | POA: Diagnosis not present

## 2017-03-26 DIAGNOSIS — Z952 Presence of prosthetic heart valve: Secondary | ICD-10-CM | POA: Diagnosis not present

## 2017-03-26 DIAGNOSIS — I517 Cardiomegaly: Secondary | ICD-10-CM | POA: Diagnosis not present

## 2017-03-26 DIAGNOSIS — R931 Abnormal findings on diagnostic imaging of heart and coronary circulation: Secondary | ICD-10-CM | POA: Diagnosis not present

## 2017-03-26 DIAGNOSIS — Z951 Presence of aortocoronary bypass graft: Secondary | ICD-10-CM | POA: Diagnosis not present

## 2017-03-26 DIAGNOSIS — I2510 Atherosclerotic heart disease of native coronary artery without angina pectoris: Secondary | ICD-10-CM | POA: Diagnosis not present

## 2017-03-26 DIAGNOSIS — Z9889 Other specified postprocedural states: Secondary | ICD-10-CM | POA: Diagnosis not present

## 2017-03-26 DIAGNOSIS — Z953 Presence of xenogenic heart valve: Secondary | ICD-10-CM | POA: Diagnosis not present

## 2017-03-30 DIAGNOSIS — E1122 Type 2 diabetes mellitus with diabetic chronic kidney disease: Secondary | ICD-10-CM | POA: Diagnosis not present

## 2017-03-30 DIAGNOSIS — M79671 Pain in right foot: Secondary | ICD-10-CM | POA: Diagnosis not present

## 2017-03-30 DIAGNOSIS — Z794 Long term (current) use of insulin: Secondary | ICD-10-CM | POA: Diagnosis not present

## 2017-03-30 DIAGNOSIS — Z7984 Long term (current) use of oral hypoglycemic drugs: Secondary | ICD-10-CM | POA: Diagnosis not present

## 2017-03-30 DIAGNOSIS — M79672 Pain in left foot: Secondary | ICD-10-CM | POA: Diagnosis not present

## 2017-03-30 DIAGNOSIS — K219 Gastro-esophageal reflux disease without esophagitis: Secondary | ICD-10-CM | POA: Diagnosis not present

## 2017-03-30 DIAGNOSIS — I10 Essential (primary) hypertension: Secondary | ICD-10-CM | POA: Diagnosis not present

## 2017-04-04 DIAGNOSIS — H25013 Cortical age-related cataract, bilateral: Secondary | ICD-10-CM | POA: Diagnosis not present

## 2017-04-04 DIAGNOSIS — H25012 Cortical age-related cataract, left eye: Secondary | ICD-10-CM | POA: Diagnosis not present

## 2017-04-04 DIAGNOSIS — H2512 Age-related nuclear cataract, left eye: Secondary | ICD-10-CM | POA: Diagnosis not present

## 2017-04-04 DIAGNOSIS — E119 Type 2 diabetes mellitus without complications: Secondary | ICD-10-CM | POA: Diagnosis not present

## 2017-04-04 DIAGNOSIS — H2513 Age-related nuclear cataract, bilateral: Secondary | ICD-10-CM | POA: Diagnosis not present

## 2017-04-04 DIAGNOSIS — H35033 Hypertensive retinopathy, bilateral: Secondary | ICD-10-CM | POA: Diagnosis not present

## 2017-04-30 DIAGNOSIS — J020 Streptococcal pharyngitis: Secondary | ICD-10-CM | POA: Diagnosis not present

## 2017-05-22 DIAGNOSIS — H25812 Combined forms of age-related cataract, left eye: Secondary | ICD-10-CM | POA: Diagnosis not present

## 2017-05-22 DIAGNOSIS — H2512 Age-related nuclear cataract, left eye: Secondary | ICD-10-CM | POA: Diagnosis not present

## 2017-06-08 ENCOUNTER — Ambulatory Visit (INDEPENDENT_AMBULATORY_CARE_PROVIDER_SITE_OTHER): Payer: Medicare Other | Admitting: Family

## 2017-06-08 ENCOUNTER — Encounter: Payer: Self-pay | Admitting: Family

## 2017-06-08 ENCOUNTER — Other Ambulatory Visit: Payer: Self-pay

## 2017-06-08 ENCOUNTER — Ambulatory Visit (HOSPITAL_COMMUNITY)
Admission: RE | Admit: 2017-06-08 | Discharge: 2017-06-08 | Disposition: A | Discharge status: Home / Self Care | Payer: Medicare Other | Source: Ambulatory Visit | Attending: Family | Admitting: Family

## 2017-06-08 VITALS — BP 128/74 | HR 72 | Temp 98.7°F | Resp 16 | Ht 66.0 in | Wt 166.4 lb

## 2017-06-08 DIAGNOSIS — Z48812 Encounter for surgical aftercare following surgery on the circulatory system: Secondary | ICD-10-CM | POA: Insufficient documentation

## 2017-06-08 DIAGNOSIS — I6521 Occlusion and stenosis of right carotid artery: Secondary | ICD-10-CM | POA: Diagnosis not present

## 2017-06-08 DIAGNOSIS — I6522 Occlusion and stenosis of left carotid artery: Secondary | ICD-10-CM | POA: Insufficient documentation

## 2017-06-08 DIAGNOSIS — Z9889 Other specified postprocedural states: Secondary | ICD-10-CM

## 2017-06-08 DIAGNOSIS — I6523 Occlusion and stenosis of bilateral carotid arteries: Secondary | ICD-10-CM

## 2017-06-08 NOTE — Patient Instructions (Signed)

## 2017-06-08 NOTE — Progress Notes (Signed)
Chief Complaint: Follow up Extracranial Carotid Artery Stenosis   History of Present Illness  [name] is a 82 y.o. male who is sp R CEA 2005 (Dr. Amedeo Plenty); Dr. Bridgett Larsson has been monitoring pt since Dr. Amedeo Plenty departure.  Previous carotid studies demonstrated: RICA: widely patent CEA, LICA <35% stenosis.   He had a R posterior frontal CVA about 2015 which left him with some word finding. Due his CVA episode, the patient has been placed on coumadin (pt also has a bioprosthetic aortic valve replacement) and aspirin.   His coumadin was stopped by his neurologist in consultation with pt's cardiologist due to the findings of microhemorrhages on MRI of his brain, suggesting possible amyloid angiopathy. Ideally, pt should not be on any blood thinner, but since he has had a prior ischemic stroke, it was decided to discontinue the anticoagulation but remain on ASA.   Pt's coumadin was stopped in December 2015 as indicated above, but he remains on ASA.   He has prostate cancer, had radiation tx. He received what sounds like Lupron injections.   He has neuropathy in his feet (was evaluated by a neurologist), his walking is also limited by bilateral hip pain, states this has been evaluated. He has had c-spine and L-spine surgery in the remote past, but his lumbar spine has not been evaluated recently per pt.    He had a CABG in June 2012. He had a cardiac ablation in September 2018 for atrial flutter/fib  Pt Diabetic: yes, states his last A1C was 6.6, states his blood sugars are higher due to prostate cancer treatment Pt smoker: former smoker, quit in 1980  Pt meds include: Statin : yes ASA: yes Other anticoagulants/antiplatelets: no    Past Medical History:  Diagnosis Date  . Anemia   . Anginal pain ([place])   . Atrial flutter ([place])   . Bladder outlet obstruction   . Carotid artery occlusion   . Coronary artery disease    post stents; prior CABG; s/p cath January 2014 with 2 VD,  patent SVG to [facility] and patent SVG to PL patent  . Degenerative disk disease   . GERD (gastroesophageal reflux disease)   . History of esophageal stricture    "has it stretched q once in awhile" (03/26/2012"  . Hyperlipidemia   . Hypertension   . Kidney stones   . Major depression   . OSA (obstructive sleep apnea)    "have a mask; haven't used one in years" (03/26/2012)  . Prostate cancer ([place])   . Skin cancer of nose 2012   "right side" (03/26/2012)  . Skin cancer of trunk    "left chest" (03/26/2012)  . Spinal stenosis   . Stroke ([place]) 2014   NO RESIDUAL PROBLEMS  . Type II diabetes mellitus ([place])   . Valvular heart disease     Social History Social History   Tobacco Use  . Smoking status: Former Smoker    Packs/day: 5.00    Years: 23.00    Pack years: 115.00    Types: Cigarettes    Last attempt to quit: 10/01/1978    Years since quitting: 38.7  . Smokeless tobacco: Never Used  Substance Use Topics  . Alcohol use: No    Comment: 03/26/2012 "last alcohol was in 1970; never had problem w/it"  . Drug use: No    Family History Family History  Problem Relation Age of Onset  . Diabetes Mother   . Coronary artery disease Mother   .  Heart disease Mother   . Stroke Father   . Cancer Father        prostate  . Cancer Daughter        lymphoma hodgkins   . Seizures Daughter     Surgical History Past Surgical History:  Procedure Laterality Date  . A-FLUTTER ABLATION N/A 12/01/2016   Procedure: A-Flutter Ablation;  Surgeon: Thompson Grayer, MD;  Location: [facility];  Service: Cardiovascular;  Laterality: N/A;  . ADENOIDECTOMY  1956  . ANTERIOR CERVICAL DECOMP/DISCECTOMY FUSION  2000's  . AORTIC VALVE REPLACEMENT  08/29/2010   25 mm CE Magna bioprosthetic - [facility]  . APPENDECTOMY  1951  . CARDIAC CATHETERIZATION  10/20/2003   Est. EF of 65% -- Critical disease in the mid to distal circumflex --  Preserved left ventricular  function --   . CARDIAC VALVE REPLACEMENT    .  CAROTID ENDARTERECTOMY  05/05/2003   right carotid endarterectomy  . CERVICAL DISC SURGERY  1980's  . CORONARY ANGIOPLASTY WITH STENT PLACEMENT  10/20/2003   Percutaneous coronary intervention/drug-eluding stent implantation, third marginal branch --  Percutaneous closure, right femoral artery -- Atherosclerotic coronary vascular disease, single vessel / Status post successful percutaneous coronary intervention/tandem drug -- eluding stent implantation proximal third marginal branch -- Typical angina was not reproduced with device insertion or balloon   . CORONARY ARTERY BYPASS GRAFT  08/29/2010   CABG X2; SVG to PDA, SVG to [place].  [name] HEMATOMA EVACUATION  05/09/2003   Evacuation of hematoma, right neck  . LEFT HEART CATHETERIZATION WITH CORONARY ANGIOGRAM N/A 03/27/2012   Procedure: LEFT HEART CATHETERIZATION WITH CORONARY ANGIOGRAM;  Surgeon: Peter M Martinique, MD;  Location: [facility] CATH LAB;  Service: Cardiovascular;  Laterality: N/A;  . LUMBAR [place]  . MITRAL VALVE REPAIR  08/23/2010   28 mm Simulus ring -[facility]  . POSTERIOR FUSION LUMBAR SPINE  1997  . PROSTATE BIOPSY    . SHOULDER ARTHROSCOPY  01/07/2007   Left shoulder impingement, labral tear  . SKIN CANCER EXCISION  1990's; 2012   "left chest & right nose" (03/26/2012)  . TEE WITHOUT CARDIOVERSION N/A 05/15/2012   Procedure: TRANSESOPHAGEAL ECHOCARDIOGRAM (TEE);  Surgeon: Thayer Headings, MD;  Location: [place];  Service: Cardiovascular;  Laterality: N/A;  . TEE WITHOUT CARDIOVERSION N/A 12/01/2016   Procedure: TRANSESOPHAGEAL ECHOCARDIOGRAM (TEE);  Surgeon: Fay Records, MD;  [facility] ENDOSCOPY;  Service: Cardiovascular;  Laterality: N/A;  . [place]  . TRANSURETHRAL RESECTION OF PROSTATE N/A 04/26/2015   Procedure: TRANSURETHRAL RESECTION OF THE PROSTATE WITH GYRUS INSTRUMENTS;  Surgeon: Ardis Hughs, MD;  Location: [facility];  Service: Urology;  Laterality: N/A;  TURP-BIPOLAR    No  Known Allergies  Current Outpatient Medications  Medication Sig Dispense Refill  . FLUoxetine (PROZAC) 20 MG capsule Take 20 mg by mouth at bedtime.     . furosemide (LASIX) 20 MG tablet Take 1 tablet (20 mg total) by mouth daily as needed for fluid. 30 tablet 3  . insulin lispro protamine-lispro (HUMALOG 75/25 MIX) (75-25) 100 UNIT/ML SUSP injection Inject 18 Units into the skin 2 (two) times daily with a meal.    . Melatonin 3 MG TABS Take 1 tablet by mouth at bedtime.     . metFORMIN (GLUMETZA) 1000 MG (MOD) 24 hr tablet Take 1 tablet (1,000 mg total) by mouth 2 (two) times daily with a meal.    . metoprolol succinate (TOPROL-XL) 50 MG  24 hr tablet Take 1 tablet (50 mg total) by mouth daily. 90 tablet 3  . Multiple Vitamin (MULTIVITAMIN) tablet Take 1 tablet by mouth daily.    . nitroGLYCERIN (NITROSTAT) 0.4 MG SL tablet Place 1 tablet (0.4 mg total) under the tongue every 5 (five) minutes as needed. For chest pain 25 tablet 5  . omeprazole (PRILOSEC) 20 MG capsule Take 20 mg by mouth every morning.    . potassium chloride SA (K-DUR,KLOR-CON) 20 MEQ tablet Take 1 tablet (20 mEq total) by mouth daily. 30 tablet 3  . rosuvastatin (CRESTOR) 10 MG tablet Take 10 mg by mouth every morning.     . vitamin C (ASCORBIC ACID) 500 MG tablet Take 500 mg by mouth 2 (two) times daily.      [name] zolpidem (AMBIEN) 10 MG tablet Take 5 mg by mouth at bedtime.     [name] losartan (COZAAR) 25 MG tablet Take 1 tablet (25 mg total) by mouth daily. 90 tablet 3   No current facility-administered medications for this visit.     Review of Systems : See HPI for pertinent positives and negatives.  Physical Examination  Vitals:   06/08/17 1353 06/08/17 1355  BP: (!) 116/53 128/74  Pulse: 72   Resp: 16   Temp: 98.7 F (37.1 C)   TempSrc: Oral   SpO2: 97%   Weight: 166 lb 6.4 oz (75.5 kg)   Height: 5\' 6"  (1.676 m)    Body mass index is 26.86 kg/m.   General: WDWN male in NAD GAIT: normal Eyes: PERRLA HENT:  No gross abnormalities.  Pulmonary:  Respirations are non-labored, good air movement, CTAB, no rales, rhonchi, or wheezing. Cardiac: regular rhythm, no detected murmur.  VASCULAR EXAM Carotid Bruits Right Left   positive Negative     Abdominal aortic pulse is not palpable. Radial pulses are 2+ palpable and equal.                                                                                                                            LE Pulses Right Left       POPLITEAL  not palpable   not palpable       POSTERIOR TIBIAL  not palpable   not palpable        DORSALIS PEDIS      ANTERIOR TIBIAL 2+ palpable  2+ palpable    Gastrointestinal: soft, nontender, BS WNL, no r/g, no palpable masses. Musculoskeletal: No muscle atrophy/wasting. M/S 5/5 throughout, extremities without ischemic changes. Skin: No rashes, no ulcers, no cellulitis.   Neurologic:  A&O X 3; appropriate affect, sensation is normal; speech is normal, CN 2-12 intact, pain and light touch intact in extremities, motor exam as listed above. Psychiatric: Normal thought content, mood appropriate to clinical situation.    Assessment: JAYSON WATERHOUSE is a 82 y.o. male who is sp R CEA in 2005. The pt had a R posterior frontal CVA in 2014 which left  him with some word finding.  His coumadin was stopped by his neurologist in consultation with pt's cardiologist (pt also has a bioprosthetic aortic valve replacement) due to the findings of microhemorrhages on MRI of his brain, suggesting possible amyloid angiopathy. Ideally, pt should not be on any blood thinner, but since he has had a prior ischemic stroke, it was decided to discontinue the anticoagulation but remain on ASA.  I offered pt referral to a podiatrist for thick toenail trimming, regular DM foot exams, and help with his feet feeling unstable with walking due to neuropathy in feet. He said he will call and let me know if he wants a referral.     DATA Carotid Duplex  (06/08/17): Right ICA: CEA site with no restenosis Left ICA: 1-39% stenosis  Bilateral vertebral artery flow is antegrade.  Bilateral subclavian artery waveforms are normal.  No significant change compared to the exams on 05-30-13 and 06-04-15.    Plan: Follow-up in 2 years with Carotid Duplex scan.   I discussed in depth with the patient the nature of atherosclerosis, and emphasized the importance of maximal medical management including strict control of blood pressure, blood glucose, and lipid levels, obtaining regular exercise, and continued cessation of smoking.  The patient is aware that without maximal medical management the underlying atherosclerotic disease process will progress, limiting the benefit of any interventions. The patient was given information about stroke prevention and what symptoms should prompt the patient to seek immediate medical care. Thank you for allowing [place] to participate in this patient's care.  Clemon Chambers, RN, MSN, FNP-C [facility] Office: [phone]  Clinic Physician: Donzetta Matters  06/08/17 2:00 PM

## 2017-06-14 DIAGNOSIS — H25011 Cortical age-related cataract, right eye: Secondary | ICD-10-CM | POA: Diagnosis not present

## 2017-06-14 DIAGNOSIS — H2511 Age-related nuclear cataract, right eye: Secondary | ICD-10-CM | POA: Diagnosis not present

## 2017-06-19 DIAGNOSIS — H25811 Combined forms of age-related cataract, right eye: Secondary | ICD-10-CM | POA: Diagnosis not present

## 2017-06-19 DIAGNOSIS — H2511 Age-related nuclear cataract, right eye: Secondary | ICD-10-CM | POA: Diagnosis not present

## 2017-06-25 DIAGNOSIS — G252 Other specified forms of tremor: Secondary | ICD-10-CM | POA: Diagnosis not present

## 2017-06-25 DIAGNOSIS — Z953 Presence of xenogenic heart valve: Secondary | ICD-10-CM | POA: Diagnosis not present

## 2017-06-25 DIAGNOSIS — I371 Nonrheumatic pulmonary valve insufficiency: Secondary | ICD-10-CM | POA: Diagnosis not present

## 2017-06-25 DIAGNOSIS — I6340 Cerebral infarction due to embolism of unspecified cerebral artery: Secondary | ICD-10-CM | POA: Diagnosis not present

## 2017-06-25 DIAGNOSIS — Z951 Presence of aortocoronary bypass graft: Secondary | ICD-10-CM | POA: Diagnosis not present

## 2017-06-25 DIAGNOSIS — Z9889 Other specified postprocedural states: Secondary | ICD-10-CM | POA: Diagnosis not present

## 2017-06-25 DIAGNOSIS — I10 Essential (primary) hypertension: Secondary | ICD-10-CM | POA: Diagnosis not present

## 2017-07-03 DIAGNOSIS — Z8546 Personal history of malignant neoplasm of prostate: Secondary | ICD-10-CM | POA: Diagnosis not present

## 2017-07-10 DIAGNOSIS — Z8546 Personal history of malignant neoplasm of prostate: Secondary | ICD-10-CM | POA: Diagnosis not present

## 2017-07-20 DIAGNOSIS — I10 Essential (primary) hypertension: Secondary | ICD-10-CM | POA: Diagnosis not present

## 2017-07-20 DIAGNOSIS — Z1389 Encounter for screening for other disorder: Secondary | ICD-10-CM | POA: Diagnosis not present

## 2017-07-20 DIAGNOSIS — E1122 Type 2 diabetes mellitus with diabetic chronic kidney disease: Secondary | ICD-10-CM | POA: Diagnosis not present

## 2017-07-20 DIAGNOSIS — Z794 Long term (current) use of insulin: Secondary | ICD-10-CM | POA: Diagnosis not present

## 2017-07-20 DIAGNOSIS — K219 Gastro-esophageal reflux disease without esophagitis: Secondary | ICD-10-CM | POA: Diagnosis not present

## 2017-07-20 DIAGNOSIS — G250 Essential tremor: Secondary | ICD-10-CM | POA: Diagnosis not present

## 2017-07-20 DIAGNOSIS — N182 Chronic kidney disease, stage 2 (mild): Secondary | ICD-10-CM | POA: Diagnosis not present

## 2017-07-20 DIAGNOSIS — Z0000 Encounter for general adult medical examination without abnormal findings: Secondary | ICD-10-CM | POA: Diagnosis not present

## 2017-07-20 DIAGNOSIS — F325 Major depressive disorder, single episode, in full remission: Secondary | ICD-10-CM | POA: Diagnosis not present

## 2017-07-20 DIAGNOSIS — I4892 Unspecified atrial flutter: Secondary | ICD-10-CM | POA: Diagnosis not present

## 2017-07-20 DIAGNOSIS — E1142 Type 2 diabetes mellitus with diabetic polyneuropathy: Secondary | ICD-10-CM | POA: Diagnosis not present

## 2017-07-20 DIAGNOSIS — I2510 Atherosclerotic heart disease of native coronary artery without angina pectoris: Secondary | ICD-10-CM | POA: Diagnosis not present

## 2017-07-20 DIAGNOSIS — D509 Iron deficiency anemia, unspecified: Secondary | ICD-10-CM | POA: Diagnosis not present

## 2017-07-20 DIAGNOSIS — E7800 Pure hypercholesterolemia, unspecified: Secondary | ICD-10-CM | POA: Diagnosis not present

## 2017-08-23 DIAGNOSIS — Z794 Long term (current) use of insulin: Secondary | ICD-10-CM | POA: Diagnosis not present

## 2017-08-23 DIAGNOSIS — G250 Essential tremor: Secondary | ICD-10-CM | POA: Diagnosis not present

## 2017-08-23 DIAGNOSIS — E1142 Type 2 diabetes mellitus with diabetic polyneuropathy: Secondary | ICD-10-CM | POA: Diagnosis not present

## 2017-12-20 ENCOUNTER — Other Ambulatory Visit: Payer: Self-pay | Admitting: Internal Medicine

## 2017-12-25 DIAGNOSIS — E1142 Type 2 diabetes mellitus with diabetic polyneuropathy: Secondary | ICD-10-CM | POA: Diagnosis not present

## 2017-12-25 DIAGNOSIS — I10 Essential (primary) hypertension: Secondary | ICD-10-CM | POA: Diagnosis not present

## 2017-12-25 DIAGNOSIS — E1122 Type 2 diabetes mellitus with diabetic chronic kidney disease: Secondary | ICD-10-CM | POA: Diagnosis not present

## 2017-12-25 DIAGNOSIS — I4892 Unspecified atrial flutter: Secondary | ICD-10-CM | POA: Diagnosis not present

## 2017-12-25 DIAGNOSIS — I2510 Atherosclerotic heart disease of native coronary artery without angina pectoris: Secondary | ICD-10-CM | POA: Diagnosis not present

## 2017-12-25 DIAGNOSIS — N182 Chronic kidney disease, stage 2 (mild): Secondary | ICD-10-CM | POA: Diagnosis not present

## 2017-12-25 DIAGNOSIS — K219 Gastro-esophageal reflux disease without esophagitis: Secondary | ICD-10-CM | POA: Diagnosis not present

## 2017-12-25 DIAGNOSIS — R29898 Other symptoms and signs involving the musculoskeletal system: Secondary | ICD-10-CM | POA: Diagnosis not present

## 2017-12-25 DIAGNOSIS — Z794 Long term (current) use of insulin: Secondary | ICD-10-CM | POA: Diagnosis not present

## 2017-12-25 DIAGNOSIS — G629 Polyneuropathy, unspecified: Secondary | ICD-10-CM | POA: Diagnosis not present

## 2017-12-25 DIAGNOSIS — D509 Iron deficiency anemia, unspecified: Secondary | ICD-10-CM | POA: Diagnosis not present

## 2018-01-16 DIAGNOSIS — Z23 Encounter for immunization: Secondary | ICD-10-CM | POA: Diagnosis not present

## 2018-02-28 DIAGNOSIS — D1801 Hemangioma of skin and subcutaneous tissue: Secondary | ICD-10-CM | POA: Diagnosis not present

## 2018-02-28 DIAGNOSIS — L821 Other seborrheic keratosis: Secondary | ICD-10-CM | POA: Diagnosis not present

## 2018-02-28 DIAGNOSIS — D485 Neoplasm of uncertain behavior of skin: Secondary | ICD-10-CM | POA: Diagnosis not present

## 2018-02-28 DIAGNOSIS — Z85828 Personal history of other malignant neoplasm of skin: Secondary | ICD-10-CM | POA: Diagnosis not present

## 2018-02-28 DIAGNOSIS — L814 Other melanin hyperpigmentation: Secondary | ICD-10-CM | POA: Diagnosis not present

## 2018-02-28 DIAGNOSIS — C44519 Basal cell carcinoma of skin of other part of trunk: Secondary | ICD-10-CM | POA: Diagnosis not present

## 2018-02-28 DIAGNOSIS — D225 Melanocytic nevi of trunk: Secondary | ICD-10-CM | POA: Diagnosis not present

## 2018-03-06 DIAGNOSIS — H35362 Drusen (degenerative) of macula, left eye: Secondary | ICD-10-CM | POA: Diagnosis not present

## 2018-03-06 DIAGNOSIS — E119 Type 2 diabetes mellitus without complications: Secondary | ICD-10-CM | POA: Diagnosis not present

## 2018-03-06 DIAGNOSIS — H35033 Hypertensive retinopathy, bilateral: Secondary | ICD-10-CM | POA: Diagnosis not present

## 2018-03-06 DIAGNOSIS — I7090 Unspecified atherosclerosis: Secondary | ICD-10-CM | POA: Diagnosis not present

## 2018-03-11 DIAGNOSIS — I680 Cerebral amyloid angiopathy: Secondary | ICD-10-CM | POA: Diagnosis not present

## 2018-03-11 DIAGNOSIS — W101XXA Fall (on)(from) sidewalk curb, initial encounter: Secondary | ICD-10-CM | POA: Diagnosis not present

## 2018-03-11 DIAGNOSIS — M4313 Spondylolisthesis, cervicothoracic region: Secondary | ICD-10-CM | POA: Diagnosis not present

## 2018-03-11 DIAGNOSIS — Z951 Presence of aortocoronary bypass graft: Secondary | ICD-10-CM | POA: Diagnosis not present

## 2018-03-11 DIAGNOSIS — S0083XA Contusion of other part of head, initial encounter: Secondary | ICD-10-CM | POA: Diagnosis not present

## 2018-03-11 DIAGNOSIS — W19XXXA Unspecified fall, initial encounter: Secondary | ICD-10-CM | POA: Diagnosis not present

## 2018-03-11 DIAGNOSIS — I119 Hypertensive heart disease without heart failure: Secondary | ICD-10-CM | POA: Diagnosis not present

## 2018-03-11 DIAGNOSIS — M4802 Spinal stenosis, cervical region: Secondary | ICD-10-CM | POA: Diagnosis not present

## 2018-03-11 DIAGNOSIS — Z87891 Personal history of nicotine dependence: Secondary | ICD-10-CM | POA: Diagnosis not present

## 2018-03-11 DIAGNOSIS — I10 Essential (primary) hypertension: Secondary | ICD-10-CM | POA: Diagnosis not present

## 2018-03-11 DIAGNOSIS — S0993XA Unspecified injury of face, initial encounter: Secondary | ICD-10-CM | POA: Diagnosis not present

## 2018-03-11 DIAGNOSIS — I483 Typical atrial flutter: Secondary | ICD-10-CM | POA: Diagnosis not present

## 2018-03-11 DIAGNOSIS — R0600 Dyspnea, unspecified: Secondary | ICD-10-CM | POA: Diagnosis not present

## 2018-03-11 DIAGNOSIS — R531 Weakness: Secondary | ICD-10-CM | POA: Diagnosis not present

## 2018-03-11 DIAGNOSIS — M542 Cervicalgia: Secondary | ICD-10-CM | POA: Diagnosis not present

## 2018-03-11 DIAGNOSIS — S022XXA Fracture of nasal bones, initial encounter for closed fracture: Secondary | ICD-10-CM | POA: Diagnosis not present

## 2018-03-11 DIAGNOSIS — Z953 Presence of xenogenic heart valve: Secondary | ICD-10-CM | POA: Diagnosis not present

## 2018-03-11 DIAGNOSIS — S0990XA Unspecified injury of head, initial encounter: Secondary | ICD-10-CM | POA: Diagnosis not present

## 2018-03-11 DIAGNOSIS — J3489 Other specified disorders of nose and nasal sinuses: Secondary | ICD-10-CM | POA: Diagnosis not present

## 2018-03-11 DIAGNOSIS — I6340 Cerebral infarction due to embolism of unspecified cerebral artery: Secondary | ICD-10-CM | POA: Diagnosis not present

## 2018-03-11 DIAGNOSIS — I371 Nonrheumatic pulmonary valve insufficiency: Secondary | ICD-10-CM | POA: Diagnosis not present

## 2018-03-11 DIAGNOSIS — M5031 Other cervical disc degeneration,  high cervical region: Secondary | ICD-10-CM | POA: Diagnosis not present

## 2018-03-11 DIAGNOSIS — S0121XA Laceration without foreign body of nose, initial encounter: Secondary | ICD-10-CM | POA: Diagnosis not present

## 2018-03-11 DIAGNOSIS — Z9889 Other specified postprocedural states: Secondary | ICD-10-CM | POA: Diagnosis not present

## 2018-03-11 DIAGNOSIS — Z8673 Personal history of transient ischemic attack (TIA), and cerebral infarction without residual deficits: Secondary | ICD-10-CM | POA: Diagnosis not present

## 2018-03-11 DIAGNOSIS — E785 Hyperlipidemia, unspecified: Secondary | ICD-10-CM | POA: Diagnosis not present

## 2018-03-11 DIAGNOSIS — E119 Type 2 diabetes mellitus without complications: Secondary | ICD-10-CM | POA: Diagnosis not present

## 2018-03-11 DIAGNOSIS — E854 Organ-limited amyloidosis: Secondary | ICD-10-CM | POA: Diagnosis not present

## 2018-03-11 DIAGNOSIS — Z8679 Personal history of other diseases of the circulatory system: Secondary | ICD-10-CM | POA: Diagnosis not present

## 2018-03-19 DIAGNOSIS — Z79899 Other long term (current) drug therapy: Secondary | ICD-10-CM | POA: Diagnosis not present

## 2018-03-22 DIAGNOSIS — S022XXA Fracture of nasal bones, initial encounter for closed fracture: Secondary | ICD-10-CM | POA: Diagnosis not present

## 2018-03-22 DIAGNOSIS — W010XXA Fall on same level from slipping, tripping and stumbling without subsequent striking against object, initial encounter: Secondary | ICD-10-CM | POA: Diagnosis not present

## 2018-04-10 DIAGNOSIS — Z85828 Personal history of other malignant neoplasm of skin: Secondary | ICD-10-CM | POA: Diagnosis not present

## 2018-04-10 DIAGNOSIS — C44519 Basal cell carcinoma of skin of other part of trunk: Secondary | ICD-10-CM | POA: Diagnosis not present

## 2018-04-29 DIAGNOSIS — E1122 Type 2 diabetes mellitus with diabetic chronic kidney disease: Secondary | ICD-10-CM | POA: Diagnosis not present

## 2018-04-29 DIAGNOSIS — I10 Essential (primary) hypertension: Secondary | ICD-10-CM | POA: Diagnosis not present

## 2018-04-29 DIAGNOSIS — K219 Gastro-esophageal reflux disease without esophagitis: Secondary | ICD-10-CM | POA: Diagnosis not present

## 2018-04-29 DIAGNOSIS — D509 Iron deficiency anemia, unspecified: Secondary | ICD-10-CM | POA: Diagnosis not present

## 2018-04-29 DIAGNOSIS — E1142 Type 2 diabetes mellitus with diabetic polyneuropathy: Secondary | ICD-10-CM | POA: Diagnosis not present

## 2018-06-28 ENCOUNTER — Other Ambulatory Visit: Payer: Self-pay | Admitting: Internal Medicine

## 2018-07-08 DIAGNOSIS — I371 Nonrheumatic pulmonary valve insufficiency: Secondary | ICD-10-CM | POA: Diagnosis not present

## 2018-07-08 DIAGNOSIS — I10 Essential (primary) hypertension: Secondary | ICD-10-CM | POA: Diagnosis not present

## 2018-07-08 DIAGNOSIS — Z953 Presence of xenogenic heart valve: Secondary | ICD-10-CM | POA: Diagnosis not present

## 2018-07-08 DIAGNOSIS — Z9889 Other specified postprocedural states: Secondary | ICD-10-CM | POA: Diagnosis not present

## 2018-07-25 DIAGNOSIS — E7800 Pure hypercholesterolemia, unspecified: Secondary | ICD-10-CM | POA: Diagnosis not present

## 2018-07-25 DIAGNOSIS — E1122 Type 2 diabetes mellitus with diabetic chronic kidney disease: Secondary | ICD-10-CM | POA: Diagnosis not present

## 2018-07-25 DIAGNOSIS — I10 Essential (primary) hypertension: Secondary | ICD-10-CM | POA: Diagnosis not present

## 2018-07-25 DIAGNOSIS — F325 Major depressive disorder, single episode, in full remission: Secondary | ICD-10-CM | POA: Diagnosis not present

## 2018-07-25 DIAGNOSIS — N182 Chronic kidney disease, stage 2 (mild): Secondary | ICD-10-CM | POA: Diagnosis not present

## 2018-07-25 DIAGNOSIS — Z0000 Encounter for general adult medical examination without abnormal findings: Secondary | ICD-10-CM | POA: Diagnosis not present

## 2018-07-25 DIAGNOSIS — I2510 Atherosclerotic heart disease of native coronary artery without angina pectoris: Secondary | ICD-10-CM | POA: Diagnosis not present

## 2018-07-25 DIAGNOSIS — E1142 Type 2 diabetes mellitus with diabetic polyneuropathy: Secondary | ICD-10-CM | POA: Diagnosis not present

## 2018-07-25 DIAGNOSIS — I4892 Unspecified atrial flutter: Secondary | ICD-10-CM | POA: Diagnosis not present

## 2018-07-25 DIAGNOSIS — Z1389 Encounter for screening for other disorder: Secondary | ICD-10-CM | POA: Diagnosis not present

## 2018-07-25 DIAGNOSIS — Z794 Long term (current) use of insulin: Secondary | ICD-10-CM | POA: Diagnosis not present

## 2018-07-25 DIAGNOSIS — G629 Polyneuropathy, unspecified: Secondary | ICD-10-CM | POA: Diagnosis not present

## 2018-11-08 DIAGNOSIS — Z23 Encounter for immunization: Secondary | ICD-10-CM | POA: Diagnosis not present

## 2018-11-28 DIAGNOSIS — Z794 Long term (current) use of insulin: Secondary | ICD-10-CM | POA: Diagnosis not present

## 2018-11-28 DIAGNOSIS — G629 Polyneuropathy, unspecified: Secondary | ICD-10-CM | POA: Diagnosis not present

## 2018-11-28 DIAGNOSIS — I10 Essential (primary) hypertension: Secondary | ICD-10-CM | POA: Diagnosis not present

## 2018-11-28 DIAGNOSIS — E1142 Type 2 diabetes mellitus with diabetic polyneuropathy: Secondary | ICD-10-CM | POA: Diagnosis not present

## 2018-11-28 DIAGNOSIS — R140 Abdominal distension (gaseous): Secondary | ICD-10-CM | POA: Diagnosis not present

## 2018-11-28 DIAGNOSIS — K219 Gastro-esophageal reflux disease without esophagitis: Secondary | ICD-10-CM | POA: Diagnosis not present

## 2018-11-28 DIAGNOSIS — E1122 Type 2 diabetes mellitus with diabetic chronic kidney disease: Secondary | ICD-10-CM | POA: Diagnosis not present

## 2018-11-28 DIAGNOSIS — D509 Iron deficiency anemia, unspecified: Secondary | ICD-10-CM | POA: Diagnosis not present

## 2018-12-04 ENCOUNTER — Other Ambulatory Visit: Payer: Self-pay | Admitting: Internal Medicine

## 2018-12-04 DIAGNOSIS — E7800 Pure hypercholesterolemia, unspecified: Secondary | ICD-10-CM | POA: Diagnosis not present

## 2018-12-04 DIAGNOSIS — Z794 Long term (current) use of insulin: Secondary | ICD-10-CM | POA: Diagnosis not present

## 2018-12-04 DIAGNOSIS — R140 Abdominal distension (gaseous): Secondary | ICD-10-CM

## 2018-12-04 DIAGNOSIS — E1122 Type 2 diabetes mellitus with diabetic chronic kidney disease: Secondary | ICD-10-CM | POA: Diagnosis not present

## 2018-12-04 DIAGNOSIS — E1142 Type 2 diabetes mellitus with diabetic polyneuropathy: Secondary | ICD-10-CM | POA: Diagnosis not present

## 2018-12-09 ENCOUNTER — Other Ambulatory Visit: Payer: Medicare Other

## 2018-12-09 DIAGNOSIS — Z953 Presence of xenogenic heart valve: Secondary | ICD-10-CM | POA: Diagnosis not present

## 2018-12-09 DIAGNOSIS — Z951 Presence of aortocoronary bypass graft: Secondary | ICD-10-CM | POA: Diagnosis not present

## 2018-12-09 DIAGNOSIS — Z9889 Other specified postprocedural states: Secondary | ICD-10-CM | POA: Diagnosis not present

## 2018-12-09 DIAGNOSIS — I10 Essential (primary) hypertension: Secondary | ICD-10-CM | POA: Diagnosis not present

## 2018-12-09 DIAGNOSIS — I6340 Cerebral infarction due to embolism of unspecified cerebral artery: Secondary | ICD-10-CM | POA: Diagnosis not present

## 2018-12-09 DIAGNOSIS — Z8679 Personal history of other diseases of the circulatory system: Secondary | ICD-10-CM | POA: Diagnosis not present

## 2018-12-09 DIAGNOSIS — I371 Nonrheumatic pulmonary valve insufficiency: Secondary | ICD-10-CM | POA: Diagnosis not present

## 2018-12-11 ENCOUNTER — Ambulatory Visit
Admission: RE | Admit: 2018-12-11 | Discharge: 2018-12-11 | Disposition: A | Discharge status: Home / Self Care | Payer: Medicare Other | Source: Ambulatory Visit | Attending: Internal Medicine | Admitting: Internal Medicine

## 2018-12-11 DIAGNOSIS — R140 Abdominal distension (gaseous): Secondary | ICD-10-CM | POA: Diagnosis not present

## 2019-04-05 ENCOUNTER — Ambulatory Visit: Payer: Medicare Other | Attending: Internal Medicine

## 2019-04-05 DIAGNOSIS — Z23 Encounter for immunization: Secondary | ICD-10-CM | POA: Diagnosis not present

## 2019-04-05 NOTE — Progress Notes (Signed)
   Covid-19 Vaccination Clinic  Name:  [name]    MRN: [id number] DOB: [birth year]  04/05/2019  [name] was observed post Covid-19 immunization for 15 minutes without incidence. He was provided with Vaccine Information Sheet and instruction to access the V-Safe system.   [name] was instructed to call 911 with any severe reactions post vaccine: [name] Difficulty breathing  . Swelling of your face and throat  . A fast heartbeat  . A bad rash all over your body  . Dizziness and weakness    Immunizations Administered    Name Date Dose VIS Date Route   Pfizer COVID-19 Vaccine 04/05/2019  1:41 PM 0.3 mL 02/28/2019 Intramuscular   Manufacturer: [employer], [employer]   Lot: [id number]   [place]: [id number]

## 2019-04-10 DIAGNOSIS — E1142 Type 2 diabetes mellitus with diabetic polyneuropathy: Secondary | ICD-10-CM | POA: Diagnosis not present

## 2019-04-10 DIAGNOSIS — I10 Essential (primary) hypertension: Secondary | ICD-10-CM | POA: Diagnosis not present

## 2019-04-10 DIAGNOSIS — G629 Polyneuropathy, unspecified: Secondary | ICD-10-CM | POA: Diagnosis not present

## 2019-04-10 DIAGNOSIS — Z794 Long term (current) use of insulin: Secondary | ICD-10-CM | POA: Diagnosis not present

## 2019-04-10 DIAGNOSIS — K219 Gastro-esophageal reflux disease without esophagitis: Secondary | ICD-10-CM | POA: Diagnosis not present

## 2019-04-10 DIAGNOSIS — E1122 Type 2 diabetes mellitus with diabetic chronic kidney disease: Secondary | ICD-10-CM | POA: Diagnosis not present

## 2019-04-11 DIAGNOSIS — E1122 Type 2 diabetes mellitus with diabetic chronic kidney disease: Secondary | ICD-10-CM | POA: Diagnosis not present

## 2019-04-21 DIAGNOSIS — H35362 Drusen (degenerative) of macula, left eye: Secondary | ICD-10-CM | POA: Diagnosis not present

## 2019-04-21 DIAGNOSIS — H35013 Changes in retinal vascular appearance, bilateral: Secondary | ICD-10-CM | POA: Diagnosis not present

## 2019-04-21 DIAGNOSIS — E113293 Type 2 diabetes mellitus with mild nonproliferative diabetic retinopathy without macular edema, bilateral: Secondary | ICD-10-CM | POA: Diagnosis not present

## 2019-04-21 DIAGNOSIS — H35033 Hypertensive retinopathy, bilateral: Secondary | ICD-10-CM | POA: Diagnosis not present

## 2019-04-23 DIAGNOSIS — I088 Other rheumatic multiple valve diseases: Secondary | ICD-10-CM | POA: Diagnosis not present

## 2019-04-23 DIAGNOSIS — Z48812 Encounter for surgical aftercare following surgery on the circulatory system: Secondary | ICD-10-CM | POA: Diagnosis not present

## 2019-04-23 DIAGNOSIS — Z953 Presence of xenogenic heart valve: Secondary | ICD-10-CM | POA: Diagnosis not present

## 2019-04-23 DIAGNOSIS — Z87891 Personal history of nicotine dependence: Secondary | ICD-10-CM | POA: Diagnosis not present

## 2019-04-23 DIAGNOSIS — Z79899 Other long term (current) drug therapy: Secondary | ICD-10-CM | POA: Diagnosis not present

## 2019-04-23 DIAGNOSIS — I5032 Chronic diastolic (congestive) heart failure: Secondary | ICD-10-CM | POA: Diagnosis not present

## 2019-04-23 DIAGNOSIS — Z9889 Other specified postprocedural states: Secondary | ICD-10-CM | POA: Diagnosis not present

## 2019-04-23 DIAGNOSIS — I2510 Atherosclerotic heart disease of native coronary artery without angina pectoris: Secondary | ICD-10-CM | POA: Diagnosis not present

## 2019-04-23 DIAGNOSIS — R9431 Abnormal electrocardiogram [ECG] [EKG]: Secondary | ICD-10-CM | POA: Diagnosis not present

## 2019-04-23 DIAGNOSIS — I498 Other specified cardiac arrhythmias: Secondary | ICD-10-CM | POA: Diagnosis not present

## 2019-04-23 DIAGNOSIS — I110 Hypertensive heart disease with heart failure: Secondary | ICD-10-CM | POA: Diagnosis not present

## 2019-04-23 DIAGNOSIS — E785 Hyperlipidemia, unspecified: Secondary | ICD-10-CM | POA: Diagnosis not present

## 2019-04-23 DIAGNOSIS — Z951 Presence of aortocoronary bypass graft: Secondary | ICD-10-CM | POA: Diagnosis not present

## 2019-04-23 DIAGNOSIS — Z7982 Long term (current) use of aspirin: Secondary | ICD-10-CM | POA: Diagnosis not present

## 2019-04-23 DIAGNOSIS — Z954 Presence of other heart-valve replacement: Secondary | ICD-10-CM | POA: Diagnosis not present

## 2019-04-23 DIAGNOSIS — Z952 Presence of prosthetic heart valve: Secondary | ICD-10-CM | POA: Diagnosis not present

## 2019-04-25 ENCOUNTER — Ambulatory Visit: Payer: Medicare Other | Attending: Internal Medicine

## 2019-04-25 DIAGNOSIS — Z23 Encounter for immunization: Secondary | ICD-10-CM

## 2019-04-25 NOTE — Progress Notes (Signed)
   Covid-19 Vaccination Clinic  Name:  [name]    MRN: [id number] DOB: 09-14-1935  04/25/2019  [name] was observed post Covid-19 immunization for 15 minutes without incidence. He was provided with Vaccine Information Sheet and instruction to access the V-Safe system.   [name] was instructed to call 911 with any severe reactions post vaccine: [name] Difficulty breathing  . Swelling of your face and throat  . A fast heartbeat  . A bad rash all over your body  . Dizziness and weakness    Immunizations Administered    Name Date Dose VIS Date Route   Pfizer COVID-19 Vaccine 04/25/2019  2:35 PM 0.3 mL 02/28/2019 Intramuscular   Manufacturer: [place]   Lot: [id number]   [place]: [id number]

## 2019-05-06 DIAGNOSIS — D225 Melanocytic nevi of trunk: Secondary | ICD-10-CM | POA: Diagnosis not present

## 2019-05-06 DIAGNOSIS — L814 Other melanin hyperpigmentation: Secondary | ICD-10-CM | POA: Diagnosis not present

## 2019-05-06 DIAGNOSIS — Z85828 Personal history of other malignant neoplasm of skin: Secondary | ICD-10-CM | POA: Diagnosis not present

## 2019-05-06 DIAGNOSIS — L821 Other seborrheic keratosis: Secondary | ICD-10-CM | POA: Diagnosis not present

## 2019-05-06 DIAGNOSIS — D1801 Hemangioma of skin and subcutaneous tissue: Secondary | ICD-10-CM | POA: Diagnosis not present

## 2019-05-15 DIAGNOSIS — F325 Major depressive disorder, single episode, in full remission: Secondary | ICD-10-CM | POA: Diagnosis not present

## 2019-05-15 DIAGNOSIS — I2510 Atherosclerotic heart disease of native coronary artery without angina pectoris: Secondary | ICD-10-CM | POA: Diagnosis not present

## 2019-05-15 DIAGNOSIS — E1142 Type 2 diabetes mellitus with diabetic polyneuropathy: Secondary | ICD-10-CM | POA: Diagnosis not present

## 2019-05-15 DIAGNOSIS — E1122 Type 2 diabetes mellitus with diabetic chronic kidney disease: Secondary | ICD-10-CM | POA: Diagnosis not present

## 2019-05-15 DIAGNOSIS — E7800 Pure hypercholesterolemia, unspecified: Secondary | ICD-10-CM | POA: Diagnosis not present

## 2019-05-15 DIAGNOSIS — C61 Malignant neoplasm of prostate: Secondary | ICD-10-CM | POA: Diagnosis not present

## 2019-05-15 DIAGNOSIS — N400 Enlarged prostate without lower urinary tract symptoms: Secondary | ICD-10-CM | POA: Diagnosis not present

## 2019-05-15 DIAGNOSIS — N182 Chronic kidney disease, stage 2 (mild): Secondary | ICD-10-CM | POA: Diagnosis not present

## 2019-05-15 DIAGNOSIS — D509 Iron deficiency anemia, unspecified: Secondary | ICD-10-CM | POA: Diagnosis not present

## 2019-05-15 DIAGNOSIS — I10 Essential (primary) hypertension: Secondary | ICD-10-CM | POA: Diagnosis not present

## 2019-06-12 DIAGNOSIS — F325 Major depressive disorder, single episode, in full remission: Secondary | ICD-10-CM | POA: Diagnosis not present

## 2019-06-12 DIAGNOSIS — I10 Essential (primary) hypertension: Secondary | ICD-10-CM | POA: Diagnosis not present

## 2019-06-12 DIAGNOSIS — I2510 Atherosclerotic heart disease of native coronary artery without angina pectoris: Secondary | ICD-10-CM | POA: Diagnosis not present

## 2019-06-12 DIAGNOSIS — E7800 Pure hypercholesterolemia, unspecified: Secondary | ICD-10-CM | POA: Diagnosis not present

## 2019-06-12 DIAGNOSIS — D509 Iron deficiency anemia, unspecified: Secondary | ICD-10-CM | POA: Diagnosis not present

## 2019-06-12 DIAGNOSIS — G4700 Insomnia, unspecified: Secondary | ICD-10-CM | POA: Diagnosis not present

## 2019-06-12 DIAGNOSIS — E1122 Type 2 diabetes mellitus with diabetic chronic kidney disease: Secondary | ICD-10-CM | POA: Diagnosis not present

## 2019-06-12 DIAGNOSIS — N400 Enlarged prostate without lower urinary tract symptoms: Secondary | ICD-10-CM | POA: Diagnosis not present

## 2019-06-12 DIAGNOSIS — N182 Chronic kidney disease, stage 2 (mild): Secondary | ICD-10-CM | POA: Diagnosis not present

## 2019-06-12 DIAGNOSIS — C61 Malignant neoplasm of prostate: Secondary | ICD-10-CM | POA: Diagnosis not present

## 2019-06-12 DIAGNOSIS — E1142 Type 2 diabetes mellitus with diabetic polyneuropathy: Secondary | ICD-10-CM | POA: Diagnosis not present

## 2019-06-23 ENCOUNTER — Other Ambulatory Visit: Payer: Self-pay | Admitting: *Deleted

## 2019-06-23 DIAGNOSIS — I6529 Occlusion and stenosis of unspecified carotid artery: Secondary | ICD-10-CM

## 2019-06-24 ENCOUNTER — Other Ambulatory Visit: Payer: Self-pay

## 2019-06-24 ENCOUNTER — Ambulatory Visit (INDEPENDENT_AMBULATORY_CARE_PROVIDER_SITE_OTHER): Payer: Medicare Other | Admitting: Physician Assistant

## 2019-06-24 ENCOUNTER — Ambulatory Visit (HOSPITAL_COMMUNITY)
Admission: RE | Admit: 2019-06-24 | Discharge: 2019-06-24 | Disposition: A | Discharge status: Home / Self Care | Payer: Medicare Other | Source: Ambulatory Visit | Attending: Surgery | Admitting: Surgery

## 2019-06-24 VITALS — BP 130/80 | Ht 66.0 in | Wt 175.7 lb

## 2019-06-24 DIAGNOSIS — I6529 Occlusion and stenosis of unspecified carotid artery: Secondary | ICD-10-CM | POA: Insufficient documentation

## 2019-06-24 DIAGNOSIS — I6523 Occlusion and stenosis of bilateral carotid arteries: Secondary | ICD-10-CM

## 2019-06-24 NOTE — Progress Notes (Signed)
Office Note     CC:  follow up Requesting Provider:  Lavone Orn, MD  HPI: [name] is a 84 y.o. (24-Aug-1935) male who presents carotid artery stenosis.  He is s/p right carotid endarterectomy in the remote past.  the patient's past medical history is significant for right posterior frontal CVA in 2015. This resulted in expressive aphasia. He also has a bioprosthetic aortic valve and was placed on Coumadin and aspirin.  His cardiologist and neurologist concluded that, due to findings of microhemorrhages on MRI of his brain, he should not be on anticoagulation.  Due to his prior ischemic stroke however it was recommended that he continue on aspirin.  Status post CABG June 2012, cardiac ablation September 2018  The pt is on a statin for cholesterol management.  The pt is on a daily aspirin.   Other AC: None The pt is on furosemide, ARB, BB for hypertension.   The pt does diabetic.  Metformin and insulin Tobacco hx: Former smoker, quit in 1980  Past Medical History:  Diagnosis Date  . Anemia   . Anginal pain ([place])   . Atrial flutter ([place])   . Bladder outlet obstruction   . Carotid artery occlusion   . Coronary artery disease    post stents; prior CABG; s/p cath January 2014 with 2 VD, patent SVG to [facility] and patent SVG to PL patent  . Degenerative disk disease   . GERD (gastroesophageal reflux disease)   . History of esophageal stricture    "has it stretched q once in awhile" (03/26/2012"  . Hyperlipidemia   . Hypertension   . Kidney stones   . Major depression   . OSA (obstructive sleep apnea)    "have a mask; haven't used one in years" (03/26/2012)  . Prostate cancer ([place])   . Skin cancer of nose 2012   "right side" (03/26/2012)  . Skin cancer of trunk    "left chest" (03/26/2012)  . Spinal stenosis   . Stroke ([place]) 2014   NO RESIDUAL PROBLEMS  . Type II diabetes mellitus ([place])   . Valvular heart disease     Past Surgical History:  Procedure Laterality Date  .  A-FLUTTER ABLATION N/A 12/01/2016   Procedure: A-Flutter Ablation;  Surgeon: Thompson Grayer, MD;  Location: [place] [facility];  Service: Cardiovascular;  Laterality: N/A;  . ADENOIDECTOMY  1956  . ANTERIOR CERVICAL DECOMP/DISCECTOMY FUSION  2000's  . AORTIC VALVE REPLACEMENT  08/29/2010   25 mm CE Magna bioprosthetic - [facility]  . APPENDECTOMY  1951  . CARDIAC CATHETERIZATION  10/20/2003   Est. EF of 65% -- Critical disease in the mid to distal circumflex --  Preserved left ventricular  function --   . CARDIAC VALVE REPLACEMENT    . CAROTID ENDARTERECTOMY  05/05/2003   right carotid endarterectomy  . CERVICAL DISC SURGERY  1980's  . CORONARY ANGIOPLASTY WITH STENT PLACEMENT  10/20/2003   Percutaneous coronary intervention/drug-eluding stent implantation, third marginal branch --  Percutaneous closure, right femoral artery -- Atherosclerotic coronary vascular disease, single vessel / Status post successful percutaneous coronary intervention/tandem drug -- eluding stent implantation proximal third marginal branch -- Typical angina was not reproduced with device insertion or balloon   . CORONARY ARTERY BYPASS GRAFT  08/29/2010   CABG X2; SVG to PDA, SVG to [place].  [name] HEMATOMA EVACUATION  05/09/2003   Evacuation of hematoma, right neck  . LEFT HEART CATHETERIZATION WITH CORONARY ANGIOGRAM N/A 03/27/2012   Procedure: LEFT  HEART CATHETERIZATION WITH CORONARY ANGIOGRAM;  Surgeon: Peter M Martinique, MD;  Location: [facility] CATH LAB;  Service: Cardiovascular;  Laterality: N/A;  . LUMBAR [place]  . MITRAL VALVE REPAIR  08/23/2010   28 mm Simulus ring -[facility]  . POSTERIOR FUSION LUMBAR SPINE  1997  . PROSTATE BIOPSY    . SHOULDER ARTHROSCOPY  01/07/2007   Left shoulder impingement, labral tear  . SKIN CANCER EXCISION  1990's; 2012   "left chest & right nose" (03/26/2012)  . TEE WITHOUT CARDIOVERSION N/A 05/15/2012   Procedure: TRANSESOPHAGEAL ECHOCARDIOGRAM (TEE);  Surgeon: Thayer Headings, MD;   Location: [place];  Service: Cardiovascular;  Laterality: N/A;  . TEE WITHOUT CARDIOVERSION N/A 12/01/2016   Procedure: TRANSESOPHAGEAL ECHOCARDIOGRAM (TEE);  Surgeon: Fay Records, MD;  Location: [facility] ENDOSCOPY;  Service: Cardiovascular;  Laterality: N/A;  . [place]  . TRANSURETHRAL RESECTION OF PROSTATE N/A 04/26/2015   Procedure: TRANSURETHRAL RESECTION OF THE PROSTATE WITH GYRUS INSTRUMENTS;  Surgeon: Ardis Hughs, MD;  Location: [facility] ORS;  Service: Urology;  Laterality: N/A;  TURP-BIPOLAR    Social History   Socioeconomic History  . Marital status: Married    Spouse name: Not on file  . Number of children: Not on file  . Years of education: Not on file  . Highest education level: Not on file  Occupational History  . Not on file  Tobacco Use  . Smoking status: Former Smoker    Packs/day: 5.00    Years: 23.00    Pack years: 115.00    Types: Cigarettes    Quit date: 10/01/1978    Years since quitting: 40.7  . Smokeless tobacco: Never Used  Substance and Sexual Activity  . Alcohol use: No    Comment: 03/26/2012 "last alcohol was in 1970; never had problem w/it"  . Drug use: No  . Sexual activity: Never  Other Topics Concern  . Not on file  Social History Narrative  . Not on file   Social Determinants of Health   Financial Resource Strain:   . Difficulty of Paying Living Expenses:   Food Insecurity:   . Worried About Charity fundraiser in the Last Year:   . Arboriculturist in the Last Year:   Transportation Needs:   . Film/video editor (Medical):   [name] Lack of Transportation (Non-Medical):   Physical Activity:   . Days of Exercise per Week:   . Minutes of Exercise per Session:   Stress:   . Feeling of Stress :   Social Connections:   . Frequency of Communication with Friends and Family:   . Frequency of Social Gatherings with Friends and Family:   . Attends Religious Services:   . Active Member of Clubs or Organizations:   .  Attends Archivist Meetings:   [name] Marital Status:   Intimate Partner Violence:   . Fear of Current or Ex-Partner:   . Emotionally Abused:   [name] Physically Abused:   . Sexually Abused:     Family History  Problem Relation Age of Onset  . Diabetes Mother   . Coronary artery disease Mother   . Heart disease Mother   . Stroke Father   . Cancer Father        prostate  . Cancer Daughter        lymphoma hodgkins   . Seizures Daughter     Current Outpatient Medications  Medication Sig Dispense Refill  .  FLUoxetine (PROZAC) 20 MG capsule Take 20 mg by mouth at bedtime.     . furosemide (LASIX) 20 MG tablet Take 1 tablet (20 mg total) by mouth daily as needed for fluid. 30 tablet 3  . insulin lispro protamine-lispro (HUMALOG 75/25 MIX) (75-25) 100 UNIT/ML SUSP injection Inject 18 Units into the skin 2 (two) times daily with a meal.    . losartan (COZAAR) 25 MG tablet Take 1 tablet by mouth once daily 90 tablet 3  . Melatonin 3 MG TABS Take 1 tablet by mouth at bedtime.     . metFORMIN (GLUMETZA) 1000 MG (MOD) 24 hr tablet Take 1 tablet (1,000 mg total) by mouth 2 (two) times daily with a meal.    . metoprolol succinate (TOPROL-XL) 50 MG 24 hr tablet Take 1 tablet (50 mg total) by mouth daily. 90 tablet 3  . Multiple Vitamin (MULTIVITAMIN) tablet Take 1 tablet by mouth daily.    . nitroGLYCERIN (NITROSTAT) 0.4 MG SL tablet Place 1 tablet (0.4 mg total) under the tongue every 5 (five) minutes as needed. For chest pain 25 tablet 5  . omeprazole (PRILOSEC) 20 MG capsule Take 20 mg by mouth every morning.    . potassium chloride SA (K-DUR,KLOR-CON) 20 MEQ tablet Take 1 tablet (20 mEq total) by mouth daily. 30 tablet 3  . rosuvastatin (CRESTOR) 10 MG tablet Take 10 mg by mouth every morning.     . vitamin C (ASCORBIC ACID) 500 MG tablet Take 500 mg by mouth 2 (two) times daily.      [name] zolpidem (AMBIEN) 10 MG tablet Take 5 mg by mouth at bedtime.      No current facility-administered  medications for this visit.    No Known Allergies   REVIEW OF SYSTEMS:  [X]  denotes positive finding, [ ]  denotes negative finding Cardiac  Comments:  Chest pain or chest pressure:    Shortness of breath upon exertion:    Short of breath when lying flat:    Irregular heart rhythm:        Vascular    Pain in calf, thigh, or hip brought on by ambulation:    Pain in feet at night that wakes you up from your sleep:     Blood clot in your veins:    Leg swelling:         Pulmonary    Oxygen at home:    Productive cough:     Wheezing:         Neurologic    Sudden weakness in arms or legs:     Sudden numbness in arms or legs:     Sudden onset of difficulty speaking or slurred speech:    Temporary loss of vision in one eye:     Problems with dizziness:         Gastrointestinal    Blood in stool:     Vomited blood:         Genitourinary    Burning when urinating:     Blood in urine:        Psychiatric    Major depression:         Hematologic    Bleeding problems:    Problems with blood clotting too easily:        Skin    Rashes or ulcers:        Constitutional    Fever or chills:      PHYSICAL EXAMINATION:  Vitals:   06/24/19 1422  Weight: 175 lb 11.2 oz (79.7 kg)  Height: 5\' 6"  (1.676 m)   General:  WDWN in NAD; vital signs documented above Gait: No ataxia. Walks unaided HENT: WNL, normocephalic Pulmonary: normal non-labored breathing , without Rales, rhonchi,  wheezing Cardiac: regular rate and rhythm  without carotid bruits Abdomen: soft, NT, no masses Skin: without rashes Vascular Exam/Pulses: Extremities: without ischemic changes, without Gangrene , without cellulitis; without open wounds; 5/5 grip strength Musculoskeletal: no muscle wasting or atrophy  Neurologic: A&O X 3;  No focal weakness or paresthesias are detected. Tongue midline. Face symmetrical Psychiatric:  The pt has Normal affect.  Pulse exam:2+ bilateral radial and brachial pulses   Non-Invasive Vascular Imaging:   In March 2019, the patient's duplex carotid ultrasound study revealed no evidence of restenosis of the right carotid and velocities in the left ICA consistent with 1 to 39% stenosis.  There were bilateral vertebral antegrade flow and normal flow of the subclavian arteries bilaterally   TODAY (06/24/2019): Right Carotid: Velocities in the right ICA are consistent with a 1-39%  stenosis.   Left Carotid: Velocities in the left ICA are consistent with a 1-39%  stenosis.   Vertebrals: Bilateral vertebral arteries demonstrate antegrade flow.   Subclavians: Normal flow hemodynamics were seen in bilateral subclavian  arteries.   ASSESSMENT/PLAN:: 84 y.o. male here for follow up for carotid artery stenosis.  Status post right carotid endarterectomy.  The patient has no signs or symptoms of new stroke/TIA.  Carotid artery duplex today stable showing 1 to 39% stenosis bilaterally. Maintained on aspirin and statin.  Reviewed signs and symptoms of stroke/mini stroke. Return in 2 years for surveillance duplex carotid sonography and exam    Barbie Banner, PA-C [facility] [phone]  Clinic MD:  Carlis Abbott

## 2019-06-26 ENCOUNTER — Other Ambulatory Visit: Payer: Self-pay | Admitting: *Deleted

## 2019-06-26 DIAGNOSIS — I6523 Occlusion and stenosis of bilateral carotid arteries: Secondary | ICD-10-CM

## 2019-07-29 DIAGNOSIS — Z23 Encounter for immunization: Secondary | ICD-10-CM | POA: Diagnosis not present

## 2019-07-29 DIAGNOSIS — N182 Chronic kidney disease, stage 2 (mild): Secondary | ICD-10-CM | POA: Diagnosis not present

## 2019-07-29 DIAGNOSIS — E1122 Type 2 diabetes mellitus with diabetic chronic kidney disease: Secondary | ICD-10-CM | POA: Diagnosis not present

## 2019-07-29 DIAGNOSIS — F325 Major depressive disorder, single episode, in full remission: Secondary | ICD-10-CM | POA: Diagnosis not present

## 2019-07-29 DIAGNOSIS — I10 Essential (primary) hypertension: Secondary | ICD-10-CM | POA: Diagnosis not present

## 2019-07-29 DIAGNOSIS — G629 Polyneuropathy, unspecified: Secondary | ICD-10-CM | POA: Diagnosis not present

## 2019-07-29 DIAGNOSIS — E7800 Pure hypercholesterolemia, unspecified: Secondary | ICD-10-CM | POA: Diagnosis not present

## 2019-07-29 DIAGNOSIS — I2510 Atherosclerotic heart disease of native coronary artery without angina pectoris: Secondary | ICD-10-CM | POA: Diagnosis not present

## 2019-07-29 DIAGNOSIS — E1142 Type 2 diabetes mellitus with diabetic polyneuropathy: Secondary | ICD-10-CM | POA: Diagnosis not present

## 2019-07-29 DIAGNOSIS — D509 Iron deficiency anemia, unspecified: Secondary | ICD-10-CM | POA: Diagnosis not present

## 2019-07-29 DIAGNOSIS — K219 Gastro-esophageal reflux disease without esophagitis: Secondary | ICD-10-CM | POA: Diagnosis not present

## 2019-07-29 DIAGNOSIS — Z0000 Encounter for general adult medical examination without abnormal findings: Secondary | ICD-10-CM | POA: Diagnosis not present

## 2019-07-29 DIAGNOSIS — Z1389 Encounter for screening for other disorder: Secondary | ICD-10-CM | POA: Diagnosis not present

## 2019-07-29 DIAGNOSIS — Z794 Long term (current) use of insulin: Secondary | ICD-10-CM | POA: Diagnosis not present

## 2019-07-29 DIAGNOSIS — F5101 Primary insomnia: Secondary | ICD-10-CM | POA: Diagnosis not present

## 2019-08-13 DIAGNOSIS — E1142 Type 2 diabetes mellitus with diabetic polyneuropathy: Secondary | ICD-10-CM | POA: Diagnosis not present

## 2019-08-13 DIAGNOSIS — I10 Essential (primary) hypertension: Secondary | ICD-10-CM | POA: Diagnosis not present

## 2019-08-13 DIAGNOSIS — C61 Malignant neoplasm of prostate: Secondary | ICD-10-CM | POA: Diagnosis not present

## 2019-08-13 DIAGNOSIS — F325 Major depressive disorder, single episode, in full remission: Secondary | ICD-10-CM | POA: Diagnosis not present

## 2019-08-13 DIAGNOSIS — D509 Iron deficiency anemia, unspecified: Secondary | ICD-10-CM | POA: Diagnosis not present

## 2019-08-13 DIAGNOSIS — G4700 Insomnia, unspecified: Secondary | ICD-10-CM | POA: Diagnosis not present

## 2019-08-13 DIAGNOSIS — E7800 Pure hypercholesterolemia, unspecified: Secondary | ICD-10-CM | POA: Diagnosis not present

## 2019-08-13 DIAGNOSIS — N182 Chronic kidney disease, stage 2 (mild): Secondary | ICD-10-CM | POA: Diagnosis not present

## 2019-08-13 DIAGNOSIS — I2510 Atherosclerotic heart disease of native coronary artery without angina pectoris: Secondary | ICD-10-CM | POA: Diagnosis not present

## 2019-08-13 DIAGNOSIS — E1122 Type 2 diabetes mellitus with diabetic chronic kidney disease: Secondary | ICD-10-CM | POA: Diagnosis not present

## 2019-08-13 DIAGNOSIS — N400 Enlarged prostate without lower urinary tract symptoms: Secondary | ICD-10-CM | POA: Diagnosis not present

## 2019-09-17 DIAGNOSIS — C61 Malignant neoplasm of prostate: Secondary | ICD-10-CM | POA: Diagnosis not present

## 2019-09-17 DIAGNOSIS — N400 Enlarged prostate without lower urinary tract symptoms: Secondary | ICD-10-CM | POA: Diagnosis not present

## 2019-09-17 DIAGNOSIS — G4700 Insomnia, unspecified: Secondary | ICD-10-CM | POA: Diagnosis not present

## 2019-09-17 DIAGNOSIS — D509 Iron deficiency anemia, unspecified: Secondary | ICD-10-CM | POA: Diagnosis not present

## 2019-09-17 DIAGNOSIS — E7800 Pure hypercholesterolemia, unspecified: Secondary | ICD-10-CM | POA: Diagnosis not present

## 2019-09-17 DIAGNOSIS — N182 Chronic kidney disease, stage 2 (mild): Secondary | ICD-10-CM | POA: Diagnosis not present

## 2019-09-17 DIAGNOSIS — E1142 Type 2 diabetes mellitus with diabetic polyneuropathy: Secondary | ICD-10-CM | POA: Diagnosis not present

## 2019-09-17 DIAGNOSIS — F325 Major depressive disorder, single episode, in full remission: Secondary | ICD-10-CM | POA: Diagnosis not present

## 2019-09-17 DIAGNOSIS — I2510 Atherosclerotic heart disease of native coronary artery without angina pectoris: Secondary | ICD-10-CM | POA: Diagnosis not present

## 2019-09-17 DIAGNOSIS — E1122 Type 2 diabetes mellitus with diabetic chronic kidney disease: Secondary | ICD-10-CM | POA: Diagnosis not present

## 2019-09-17 DIAGNOSIS — I10 Essential (primary) hypertension: Secondary | ICD-10-CM | POA: Diagnosis not present

## 2019-10-06 DIAGNOSIS — I10 Essential (primary) hypertension: Secondary | ICD-10-CM | POA: Diagnosis not present

## 2019-10-06 DIAGNOSIS — I6340 Cerebral infarction due to embolism of unspecified cerebral artery: Secondary | ICD-10-CM | POA: Diagnosis not present

## 2019-10-06 DIAGNOSIS — I371 Nonrheumatic pulmonary valve insufficiency: Secondary | ICD-10-CM | POA: Diagnosis not present

## 2019-10-06 DIAGNOSIS — Z953 Presence of xenogenic heart valve: Secondary | ICD-10-CM | POA: Diagnosis not present

## 2019-10-06 DIAGNOSIS — Z9889 Other specified postprocedural states: Secondary | ICD-10-CM | POA: Diagnosis not present

## 2019-10-06 DIAGNOSIS — E785 Hyperlipidemia, unspecified: Secondary | ICD-10-CM | POA: Diagnosis not present

## 2019-10-06 DIAGNOSIS — Z8679 Personal history of other diseases of the circulatory system: Secondary | ICD-10-CM | POA: Diagnosis not present

## 2019-10-06 DIAGNOSIS — Z951 Presence of aortocoronary bypass graft: Secondary | ICD-10-CM | POA: Diagnosis not present

## 2019-10-06 DIAGNOSIS — I2510 Atherosclerotic heart disease of native coronary artery without angina pectoris: Secondary | ICD-10-CM | POA: Diagnosis not present

## 2019-10-06 DIAGNOSIS — I5032 Chronic diastolic (congestive) heart failure: Secondary | ICD-10-CM | POA: Diagnosis not present

## 2019-10-24 DIAGNOSIS — M5441 Lumbago with sciatica, right side: Secondary | ICD-10-CM | POA: Diagnosis not present

## 2019-10-24 DIAGNOSIS — G8929 Other chronic pain: Secondary | ICD-10-CM | POA: Diagnosis not present

## 2019-11-27 DIAGNOSIS — C61 Malignant neoplasm of prostate: Secondary | ICD-10-CM | POA: Diagnosis not present

## 2019-11-27 DIAGNOSIS — I10 Essential (primary) hypertension: Secondary | ICD-10-CM | POA: Diagnosis not present

## 2019-11-27 DIAGNOSIS — M5431 Sciatica, right side: Secondary | ICD-10-CM | POA: Diagnosis not present

## 2019-11-27 DIAGNOSIS — D509 Iron deficiency anemia, unspecified: Secondary | ICD-10-CM | POA: Diagnosis not present

## 2019-11-27 DIAGNOSIS — E1142 Type 2 diabetes mellitus with diabetic polyneuropathy: Secondary | ICD-10-CM | POA: Diagnosis not present

## 2019-12-02 DIAGNOSIS — Z8546 Personal history of malignant neoplasm of prostate: Secondary | ICD-10-CM | POA: Diagnosis not present

## 2019-12-02 DIAGNOSIS — M48061 Spinal stenosis, lumbar region without neurogenic claudication: Secondary | ICD-10-CM | POA: Diagnosis not present

## 2019-12-02 DIAGNOSIS — M5441 Lumbago with sciatica, right side: Secondary | ICD-10-CM | POA: Diagnosis not present

## 2019-12-03 DIAGNOSIS — M5441 Lumbago with sciatica, right side: Secondary | ICD-10-CM | POA: Diagnosis not present

## 2019-12-05 DIAGNOSIS — E1142 Type 2 diabetes mellitus with diabetic polyneuropathy: Secondary | ICD-10-CM | POA: Diagnosis not present

## 2019-12-05 DIAGNOSIS — E7800 Pure hypercholesterolemia, unspecified: Secondary | ICD-10-CM | POA: Diagnosis not present

## 2019-12-05 DIAGNOSIS — F325 Major depressive disorder, single episode, in full remission: Secondary | ICD-10-CM | POA: Diagnosis not present

## 2019-12-05 DIAGNOSIS — E1122 Type 2 diabetes mellitus with diabetic chronic kidney disease: Secondary | ICD-10-CM | POA: Diagnosis not present

## 2019-12-05 DIAGNOSIS — N182 Chronic kidney disease, stage 2 (mild): Secondary | ICD-10-CM | POA: Diagnosis not present

## 2019-12-05 DIAGNOSIS — I10 Essential (primary) hypertension: Secondary | ICD-10-CM | POA: Diagnosis not present

## 2019-12-05 DIAGNOSIS — I2510 Atherosclerotic heart disease of native coronary artery without angina pectoris: Secondary | ICD-10-CM | POA: Diagnosis not present

## 2019-12-05 DIAGNOSIS — D509 Iron deficiency anemia, unspecified: Secondary | ICD-10-CM | POA: Diagnosis not present

## 2019-12-05 DIAGNOSIS — G4700 Insomnia, unspecified: Secondary | ICD-10-CM | POA: Diagnosis not present

## 2019-12-05 DIAGNOSIS — C61 Malignant neoplasm of prostate: Secondary | ICD-10-CM | POA: Diagnosis not present

## 2019-12-05 DIAGNOSIS — N400 Enlarged prostate without lower urinary tract symptoms: Secondary | ICD-10-CM | POA: Diagnosis not present

## 2019-12-08 DIAGNOSIS — Z981 Arthrodesis status: Secondary | ICD-10-CM | POA: Diagnosis not present

## 2019-12-08 DIAGNOSIS — M5136 Other intervertebral disc degeneration, lumbar region: Secondary | ICD-10-CM | POA: Diagnosis not present

## 2019-12-08 DIAGNOSIS — M5137 Other intervertebral disc degeneration, lumbosacral region: Secondary | ICD-10-CM | POA: Diagnosis not present

## 2019-12-12 DIAGNOSIS — Z23 Encounter for immunization: Secondary | ICD-10-CM | POA: Diagnosis not present

## 2019-12-16 DIAGNOSIS — E291 Testicular hypofunction: Secondary | ICD-10-CM | POA: Diagnosis not present

## 2019-12-16 DIAGNOSIS — Z8546 Personal history of malignant neoplasm of prostate: Secondary | ICD-10-CM | POA: Diagnosis not present

## 2020-01-02 DIAGNOSIS — M5441 Lumbago with sciatica, right side: Secondary | ICD-10-CM | POA: Diagnosis not present

## 2020-01-02 DIAGNOSIS — Z6828 Body mass index (BMI) 28.0-28.9, adult: Secondary | ICD-10-CM | POA: Diagnosis not present

## 2020-01-14 DIAGNOSIS — R269 Unspecified abnormalities of gait and mobility: Secondary | ICD-10-CM | POA: Diagnosis not present

## 2020-01-14 DIAGNOSIS — M79604 Pain in right leg: Secondary | ICD-10-CM | POA: Diagnosis not present

## 2020-01-14 DIAGNOSIS — M5441 Lumbago with sciatica, right side: Secondary | ICD-10-CM | POA: Diagnosis not present

## 2020-01-14 DIAGNOSIS — M545 Low back pain, unspecified: Secondary | ICD-10-CM | POA: Diagnosis not present

## 2020-01-22 DIAGNOSIS — M5441 Lumbago with sciatica, right side: Secondary | ICD-10-CM | POA: Diagnosis not present

## 2020-01-22 DIAGNOSIS — R269 Unspecified abnormalities of gait and mobility: Secondary | ICD-10-CM | POA: Diagnosis not present

## 2020-01-22 DIAGNOSIS — M79604 Pain in right leg: Secondary | ICD-10-CM | POA: Diagnosis not present

## 2020-01-22 DIAGNOSIS — M545 Low back pain, unspecified: Secondary | ICD-10-CM | POA: Diagnosis not present

## 2020-02-17 DIAGNOSIS — K219 Gastro-esophageal reflux disease without esophagitis: Secondary | ICD-10-CM | POA: Diagnosis not present

## 2020-02-17 DIAGNOSIS — E1122 Type 2 diabetes mellitus with diabetic chronic kidney disease: Secondary | ICD-10-CM | POA: Diagnosis not present

## 2020-02-17 DIAGNOSIS — E7800 Pure hypercholesterolemia, unspecified: Secondary | ICD-10-CM | POA: Diagnosis not present

## 2020-02-17 DIAGNOSIS — D509 Iron deficiency anemia, unspecified: Secondary | ICD-10-CM | POA: Diagnosis not present

## 2020-02-17 DIAGNOSIS — N400 Enlarged prostate without lower urinary tract symptoms: Secondary | ICD-10-CM | POA: Diagnosis not present

## 2020-02-17 DIAGNOSIS — G4700 Insomnia, unspecified: Secondary | ICD-10-CM | POA: Diagnosis not present

## 2020-02-17 DIAGNOSIS — I2510 Atherosclerotic heart disease of native coronary artery without angina pectoris: Secondary | ICD-10-CM | POA: Diagnosis not present

## 2020-02-17 DIAGNOSIS — E1142 Type 2 diabetes mellitus with diabetic polyneuropathy: Secondary | ICD-10-CM | POA: Diagnosis not present

## 2020-02-17 DIAGNOSIS — C61 Malignant neoplasm of prostate: Secondary | ICD-10-CM | POA: Diagnosis not present

## 2020-02-17 DIAGNOSIS — F325 Major depressive disorder, single episode, in full remission: Secondary | ICD-10-CM | POA: Diagnosis not present

## 2020-02-17 DIAGNOSIS — N182 Chronic kidney disease, stage 2 (mild): Secondary | ICD-10-CM | POA: Diagnosis not present

## 2020-02-17 DIAGNOSIS — I10 Essential (primary) hypertension: Secondary | ICD-10-CM | POA: Diagnosis not present

## 2020-03-30 DIAGNOSIS — I10 Essential (primary) hypertension: Secondary | ICD-10-CM | POA: Diagnosis not present

## 2020-03-30 DIAGNOSIS — D509 Iron deficiency anemia, unspecified: Secondary | ICD-10-CM | POA: Diagnosis not present

## 2020-03-30 DIAGNOSIS — E1122 Type 2 diabetes mellitus with diabetic chronic kidney disease: Secondary | ICD-10-CM | POA: Diagnosis not present

## 2020-03-30 DIAGNOSIS — K219 Gastro-esophageal reflux disease without esophagitis: Secondary | ICD-10-CM | POA: Diagnosis not present

## 2020-04-19 DIAGNOSIS — I5032 Chronic diastolic (congestive) heart failure: Secondary | ICD-10-CM | POA: Diagnosis not present

## 2020-04-19 DIAGNOSIS — Z8679 Personal history of other diseases of the circulatory system: Secondary | ICD-10-CM | POA: Diagnosis not present

## 2020-04-19 DIAGNOSIS — I10 Essential (primary) hypertension: Secondary | ICD-10-CM | POA: Diagnosis not present

## 2020-04-19 DIAGNOSIS — Z953 Presence of xenogenic heart valve: Secondary | ICD-10-CM | POA: Diagnosis not present

## 2020-04-19 DIAGNOSIS — I6340 Cerebral infarction due to embolism of unspecified cerebral artery: Secondary | ICD-10-CM | POA: Diagnosis not present

## 2020-04-19 DIAGNOSIS — I371 Nonrheumatic pulmonary valve insufficiency: Secondary | ICD-10-CM | POA: Diagnosis not present

## 2020-04-19 DIAGNOSIS — Z951 Presence of aortocoronary bypass graft: Secondary | ICD-10-CM | POA: Diagnosis not present

## 2020-04-19 DIAGNOSIS — I2510 Atherosclerotic heart disease of native coronary artery without angina pectoris: Secondary | ICD-10-CM | POA: Diagnosis not present

## 2020-04-19 DIAGNOSIS — Z9889 Other specified postprocedural states: Secondary | ICD-10-CM | POA: Diagnosis not present

## 2020-04-23 DIAGNOSIS — H26491 Other secondary cataract, right eye: Secondary | ICD-10-CM | POA: Diagnosis not present

## 2020-04-23 DIAGNOSIS — E113293 Type 2 diabetes mellitus with mild nonproliferative diabetic retinopathy without macular edema, bilateral: Secondary | ICD-10-CM | POA: Diagnosis not present

## 2020-04-23 DIAGNOSIS — H04123 Dry eye syndrome of bilateral lacrimal glands: Secondary | ICD-10-CM | POA: Diagnosis not present

## 2020-04-23 DIAGNOSIS — H35362 Drusen (degenerative) of macula, left eye: Secondary | ICD-10-CM | POA: Diagnosis not present

## 2020-06-16 DIAGNOSIS — K219 Gastro-esophageal reflux disease without esophagitis: Secondary | ICD-10-CM | POA: Diagnosis not present

## 2020-06-16 DIAGNOSIS — F325 Major depressive disorder, single episode, in full remission: Secondary | ICD-10-CM | POA: Diagnosis not present

## 2020-06-16 DIAGNOSIS — I2510 Atherosclerotic heart disease of native coronary artery without angina pectoris: Secondary | ICD-10-CM | POA: Diagnosis not present

## 2020-06-16 DIAGNOSIS — C61 Malignant neoplasm of prostate: Secondary | ICD-10-CM | POA: Diagnosis not present

## 2020-06-16 DIAGNOSIS — G4700 Insomnia, unspecified: Secondary | ICD-10-CM | POA: Diagnosis not present

## 2020-06-16 DIAGNOSIS — I10 Essential (primary) hypertension: Secondary | ICD-10-CM | POA: Diagnosis not present

## 2020-06-16 DIAGNOSIS — N182 Chronic kidney disease, stage 2 (mild): Secondary | ICD-10-CM | POA: Diagnosis not present

## 2020-06-16 DIAGNOSIS — D509 Iron deficiency anemia, unspecified: Secondary | ICD-10-CM | POA: Diagnosis not present

## 2020-06-16 DIAGNOSIS — N400 Enlarged prostate without lower urinary tract symptoms: Secondary | ICD-10-CM | POA: Diagnosis not present

## 2020-06-16 DIAGNOSIS — E7800 Pure hypercholesterolemia, unspecified: Secondary | ICD-10-CM | POA: Diagnosis not present

## 2020-06-16 DIAGNOSIS — E1142 Type 2 diabetes mellitus with diabetic polyneuropathy: Secondary | ICD-10-CM | POA: Diagnosis not present

## 2020-06-16 DIAGNOSIS — E1122 Type 2 diabetes mellitus with diabetic chronic kidney disease: Secondary | ICD-10-CM | POA: Diagnosis not present

## 2020-06-30 DIAGNOSIS — Z85828 Personal history of other malignant neoplasm of skin: Secondary | ICD-10-CM | POA: Diagnosis not present

## 2020-06-30 DIAGNOSIS — D692 Other nonthrombocytopenic purpura: Secondary | ICD-10-CM | POA: Diagnosis not present

## 2020-06-30 DIAGNOSIS — L814 Other melanin hyperpigmentation: Secondary | ICD-10-CM | POA: Diagnosis not present

## 2020-06-30 DIAGNOSIS — L821 Other seborrheic keratosis: Secondary | ICD-10-CM | POA: Diagnosis not present

## 2020-06-30 DIAGNOSIS — D1801 Hemangioma of skin and subcutaneous tissue: Secondary | ICD-10-CM | POA: Diagnosis not present

## 2020-06-30 DIAGNOSIS — L570 Actinic keratosis: Secondary | ICD-10-CM | POA: Diagnosis not present

## 2020-06-30 DIAGNOSIS — D225 Melanocytic nevi of trunk: Secondary | ICD-10-CM | POA: Diagnosis not present

## 2020-06-30 DIAGNOSIS — L720 Epidermal cyst: Secondary | ICD-10-CM | POA: Diagnosis not present

## 2020-06-30 DIAGNOSIS — L0889 Other specified local infections of the skin and subcutaneous tissue: Secondary | ICD-10-CM | POA: Diagnosis not present

## 2020-07-07 DIAGNOSIS — Z23 Encounter for immunization: Secondary | ICD-10-CM | POA: Diagnosis not present

## 2020-07-13 DIAGNOSIS — F325 Major depressive disorder, single episode, in full remission: Secondary | ICD-10-CM | POA: Diagnosis not present

## 2020-07-13 DIAGNOSIS — I10 Essential (primary) hypertension: Secondary | ICD-10-CM | POA: Diagnosis not present

## 2020-07-13 DIAGNOSIS — N400 Enlarged prostate without lower urinary tract symptoms: Secondary | ICD-10-CM | POA: Diagnosis not present

## 2020-07-13 DIAGNOSIS — E7800 Pure hypercholesterolemia, unspecified: Secondary | ICD-10-CM | POA: Diagnosis not present

## 2020-07-13 DIAGNOSIS — N182 Chronic kidney disease, stage 2 (mild): Secondary | ICD-10-CM | POA: Diagnosis not present

## 2020-07-13 DIAGNOSIS — G4700 Insomnia, unspecified: Secondary | ICD-10-CM | POA: Diagnosis not present

## 2020-07-13 DIAGNOSIS — E1122 Type 2 diabetes mellitus with diabetic chronic kidney disease: Secondary | ICD-10-CM | POA: Diagnosis not present

## 2020-07-13 DIAGNOSIS — G8929 Other chronic pain: Secondary | ICD-10-CM | POA: Diagnosis not present

## 2020-07-13 DIAGNOSIS — K219 Gastro-esophageal reflux disease without esophagitis: Secondary | ICD-10-CM | POA: Diagnosis not present

## 2020-07-13 DIAGNOSIS — D509 Iron deficiency anemia, unspecified: Secondary | ICD-10-CM | POA: Diagnosis not present

## 2020-07-13 DIAGNOSIS — E1142 Type 2 diabetes mellitus with diabetic polyneuropathy: Secondary | ICD-10-CM | POA: Diagnosis not present

## 2020-08-02 DIAGNOSIS — I10 Essential (primary) hypertension: Secondary | ICD-10-CM | POA: Diagnosis not present

## 2020-08-02 DIAGNOSIS — Z794 Long term (current) use of insulin: Secondary | ICD-10-CM | POA: Diagnosis not present

## 2020-08-02 DIAGNOSIS — I2510 Atherosclerotic heart disease of native coronary artery without angina pectoris: Secondary | ICD-10-CM | POA: Diagnosis not present

## 2020-08-02 DIAGNOSIS — K219 Gastro-esophageal reflux disease without esophagitis: Secondary | ICD-10-CM | POA: Diagnosis not present

## 2020-08-02 DIAGNOSIS — E1142 Type 2 diabetes mellitus with diabetic polyneuropathy: Secondary | ICD-10-CM | POA: Diagnosis not present

## 2020-08-02 DIAGNOSIS — E1122 Type 2 diabetes mellitus with diabetic chronic kidney disease: Secondary | ICD-10-CM | POA: Diagnosis not present

## 2020-08-02 DIAGNOSIS — G629 Polyneuropathy, unspecified: Secondary | ICD-10-CM | POA: Diagnosis not present

## 2020-08-02 DIAGNOSIS — Z0000 Encounter for general adult medical examination without abnormal findings: Secondary | ICD-10-CM | POA: Diagnosis not present

## 2020-08-02 DIAGNOSIS — Z1389 Encounter for screening for other disorder: Secondary | ICD-10-CM | POA: Diagnosis not present

## 2020-08-02 DIAGNOSIS — G4733 Obstructive sleep apnea (adult) (pediatric): Secondary | ICD-10-CM | POA: Diagnosis not present

## 2020-08-02 DIAGNOSIS — N182 Chronic kidney disease, stage 2 (mild): Secondary | ICD-10-CM | POA: Diagnosis not present

## 2020-08-02 DIAGNOSIS — E7800 Pure hypercholesterolemia, unspecified: Secondary | ICD-10-CM | POA: Diagnosis not present

## 2020-08-10 DIAGNOSIS — E875 Hyperkalemia: Secondary | ICD-10-CM | POA: Diagnosis not present

## 2020-08-10 DIAGNOSIS — D649 Anemia, unspecified: Secondary | ICD-10-CM | POA: Diagnosis not present

## 2020-08-17 DIAGNOSIS — E1142 Type 2 diabetes mellitus with diabetic polyneuropathy: Secondary | ICD-10-CM | POA: Diagnosis not present

## 2020-08-17 DIAGNOSIS — C61 Malignant neoplasm of prostate: Secondary | ICD-10-CM | POA: Diagnosis not present

## 2020-08-17 DIAGNOSIS — K219 Gastro-esophageal reflux disease without esophagitis: Secondary | ICD-10-CM | POA: Diagnosis not present

## 2020-08-17 DIAGNOSIS — I10 Essential (primary) hypertension: Secondary | ICD-10-CM | POA: Diagnosis not present

## 2020-08-17 DIAGNOSIS — N400 Enlarged prostate without lower urinary tract symptoms: Secondary | ICD-10-CM | POA: Diagnosis not present

## 2020-08-17 DIAGNOSIS — N182 Chronic kidney disease, stage 2 (mild): Secondary | ICD-10-CM | POA: Diagnosis not present

## 2020-08-17 DIAGNOSIS — F325 Major depressive disorder, single episode, in full remission: Secondary | ICD-10-CM | POA: Diagnosis not present

## 2020-08-17 DIAGNOSIS — E1122 Type 2 diabetes mellitus with diabetic chronic kidney disease: Secondary | ICD-10-CM | POA: Diagnosis not present

## 2020-08-17 DIAGNOSIS — I2510 Atherosclerotic heart disease of native coronary artery without angina pectoris: Secondary | ICD-10-CM | POA: Diagnosis not present

## 2020-08-17 DIAGNOSIS — D509 Iron deficiency anemia, unspecified: Secondary | ICD-10-CM | POA: Diagnosis not present

## 2020-08-17 DIAGNOSIS — G4700 Insomnia, unspecified: Secondary | ICD-10-CM | POA: Diagnosis not present

## 2020-08-17 DIAGNOSIS — E7800 Pure hypercholesterolemia, unspecified: Secondary | ICD-10-CM | POA: Diagnosis not present

## 2020-10-11 DIAGNOSIS — G4700 Insomnia, unspecified: Secondary | ICD-10-CM | POA: Diagnosis not present

## 2020-10-11 DIAGNOSIS — D509 Iron deficiency anemia, unspecified: Secondary | ICD-10-CM | POA: Diagnosis not present

## 2020-10-11 DIAGNOSIS — N182 Chronic kidney disease, stage 2 (mild): Secondary | ICD-10-CM | POA: Diagnosis not present

## 2020-10-11 DIAGNOSIS — C61 Malignant neoplasm of prostate: Secondary | ICD-10-CM | POA: Diagnosis not present

## 2020-10-11 DIAGNOSIS — F325 Major depressive disorder, single episode, in full remission: Secondary | ICD-10-CM | POA: Diagnosis not present

## 2020-10-11 DIAGNOSIS — N400 Enlarged prostate without lower urinary tract symptoms: Secondary | ICD-10-CM | POA: Diagnosis not present

## 2020-10-11 DIAGNOSIS — I10 Essential (primary) hypertension: Secondary | ICD-10-CM | POA: Diagnosis not present

## 2020-10-11 DIAGNOSIS — I2510 Atherosclerotic heart disease of native coronary artery without angina pectoris: Secondary | ICD-10-CM | POA: Diagnosis not present

## 2020-10-11 DIAGNOSIS — E7800 Pure hypercholesterolemia, unspecified: Secondary | ICD-10-CM | POA: Diagnosis not present

## 2020-10-11 DIAGNOSIS — E1142 Type 2 diabetes mellitus with diabetic polyneuropathy: Secondary | ICD-10-CM | POA: Diagnosis not present

## 2020-10-11 DIAGNOSIS — K219 Gastro-esophageal reflux disease without esophagitis: Secondary | ICD-10-CM | POA: Diagnosis not present

## 2020-10-11 DIAGNOSIS — E1122 Type 2 diabetes mellitus with diabetic chronic kidney disease: Secondary | ICD-10-CM | POA: Diagnosis not present

## 2020-10-21 DIAGNOSIS — H04123 Dry eye syndrome of bilateral lacrimal glands: Secondary | ICD-10-CM | POA: Diagnosis not present

## 2020-10-21 DIAGNOSIS — E113293 Type 2 diabetes mellitus with mild nonproliferative diabetic retinopathy without macular edema, bilateral: Secondary | ICD-10-CM | POA: Diagnosis not present

## 2020-10-21 DIAGNOSIS — H35013 Changes in retinal vascular appearance, bilateral: Secondary | ICD-10-CM | POA: Diagnosis not present

## 2020-10-21 DIAGNOSIS — H18593 Other hereditary corneal dystrophies, bilateral: Secondary | ICD-10-CM | POA: Diagnosis not present

## 2020-10-21 DIAGNOSIS — H52213 Irregular astigmatism, bilateral: Secondary | ICD-10-CM | POA: Diagnosis not present

## 2020-10-21 DIAGNOSIS — H35033 Hypertensive retinopathy, bilateral: Secondary | ICD-10-CM | POA: Diagnosis not present

## 2020-10-25 DIAGNOSIS — I371 Nonrheumatic pulmonary valve insufficiency: Secondary | ICD-10-CM | POA: Diagnosis not present

## 2020-10-25 DIAGNOSIS — Z951 Presence of aortocoronary bypass graft: Secondary | ICD-10-CM | POA: Diagnosis not present

## 2020-10-25 DIAGNOSIS — Z8679 Personal history of other diseases of the circulatory system: Secondary | ICD-10-CM | POA: Diagnosis not present

## 2020-10-25 DIAGNOSIS — Z9889 Other specified postprocedural states: Secondary | ICD-10-CM | POA: Diagnosis not present

## 2020-10-25 DIAGNOSIS — E785 Hyperlipidemia, unspecified: Secondary | ICD-10-CM | POA: Diagnosis not present

## 2020-10-25 DIAGNOSIS — I2510 Atherosclerotic heart disease of native coronary artery without angina pectoris: Secondary | ICD-10-CM | POA: Diagnosis not present

## 2020-10-25 DIAGNOSIS — I10 Essential (primary) hypertension: Secondary | ICD-10-CM | POA: Diagnosis not present

## 2020-10-25 DIAGNOSIS — Z953 Presence of xenogenic heart valve: Secondary | ICD-10-CM | POA: Diagnosis not present

## 2020-11-29 DIAGNOSIS — Z23 Encounter for immunization: Secondary | ICD-10-CM | POA: Diagnosis not present

## 2020-12-03 DIAGNOSIS — Z794 Long term (current) use of insulin: Secondary | ICD-10-CM | POA: Diagnosis not present

## 2020-12-03 DIAGNOSIS — E1122 Type 2 diabetes mellitus with diabetic chronic kidney disease: Secondary | ICD-10-CM | POA: Diagnosis not present

## 2020-12-03 DIAGNOSIS — D509 Iron deficiency anemia, unspecified: Secondary | ICD-10-CM | POA: Diagnosis not present

## 2020-12-03 DIAGNOSIS — G629 Polyneuropathy, unspecified: Secondary | ICD-10-CM | POA: Diagnosis not present

## 2020-12-03 DIAGNOSIS — G8929 Other chronic pain: Secondary | ICD-10-CM | POA: Diagnosis not present

## 2020-12-03 DIAGNOSIS — I10 Essential (primary) hypertension: Secondary | ICD-10-CM | POA: Diagnosis not present

## 2020-12-03 DIAGNOSIS — I2510 Atherosclerotic heart disease of native coronary artery without angina pectoris: Secondary | ICD-10-CM | POA: Diagnosis not present

## 2020-12-12 DIAGNOSIS — F325 Major depressive disorder, single episode, in full remission: Secondary | ICD-10-CM | POA: Diagnosis not present

## 2020-12-12 DIAGNOSIS — I10 Essential (primary) hypertension: Secondary | ICD-10-CM | POA: Diagnosis not present

## 2020-12-12 DIAGNOSIS — G4700 Insomnia, unspecified: Secondary | ICD-10-CM | POA: Diagnosis not present

## 2020-12-12 DIAGNOSIS — E1142 Type 2 diabetes mellitus with diabetic polyneuropathy: Secondary | ICD-10-CM | POA: Diagnosis not present

## 2020-12-12 DIAGNOSIS — E7800 Pure hypercholesterolemia, unspecified: Secondary | ICD-10-CM | POA: Diagnosis not present

## 2020-12-12 DIAGNOSIS — N400 Enlarged prostate without lower urinary tract symptoms: Secondary | ICD-10-CM | POA: Diagnosis not present

## 2020-12-12 DIAGNOSIS — E1122 Type 2 diabetes mellitus with diabetic chronic kidney disease: Secondary | ICD-10-CM | POA: Diagnosis not present

## 2020-12-12 DIAGNOSIS — K219 Gastro-esophageal reflux disease without esophagitis: Secondary | ICD-10-CM | POA: Diagnosis not present

## 2020-12-12 DIAGNOSIS — G8929 Other chronic pain: Secondary | ICD-10-CM | POA: Diagnosis not present

## 2020-12-12 DIAGNOSIS — H35033 Hypertensive retinopathy, bilateral: Secondary | ICD-10-CM | POA: Diagnosis not present

## 2020-12-12 DIAGNOSIS — N182 Chronic kidney disease, stage 2 (mild): Secondary | ICD-10-CM | POA: Diagnosis not present

## 2020-12-12 DIAGNOSIS — I2510 Atherosclerotic heart disease of native coronary artery without angina pectoris: Secondary | ICD-10-CM | POA: Diagnosis not present

## 2021-01-20 DIAGNOSIS — N400 Enlarged prostate without lower urinary tract symptoms: Secondary | ICD-10-CM | POA: Diagnosis not present

## 2021-01-20 DIAGNOSIS — I10 Essential (primary) hypertension: Secondary | ICD-10-CM | POA: Diagnosis not present

## 2021-01-20 DIAGNOSIS — E7800 Pure hypercholesterolemia, unspecified: Secondary | ICD-10-CM | POA: Diagnosis not present

## 2021-01-20 DIAGNOSIS — H35033 Hypertensive retinopathy, bilateral: Secondary | ICD-10-CM | POA: Diagnosis not present

## 2021-01-20 DIAGNOSIS — I2510 Atherosclerotic heart disease of native coronary artery without angina pectoris: Secondary | ICD-10-CM | POA: Diagnosis not present

## 2021-01-20 DIAGNOSIS — G4700 Insomnia, unspecified: Secondary | ICD-10-CM | POA: Diagnosis not present

## 2021-01-20 DIAGNOSIS — E1122 Type 2 diabetes mellitus with diabetic chronic kidney disease: Secondary | ICD-10-CM | POA: Diagnosis not present

## 2021-01-20 DIAGNOSIS — K219 Gastro-esophageal reflux disease without esophagitis: Secondary | ICD-10-CM | POA: Diagnosis not present

## 2021-01-20 DIAGNOSIS — N182 Chronic kidney disease, stage 2 (mild): Secondary | ICD-10-CM | POA: Diagnosis not present

## 2021-01-20 DIAGNOSIS — G8929 Other chronic pain: Secondary | ICD-10-CM | POA: Diagnosis not present

## 2021-01-20 DIAGNOSIS — E1142 Type 2 diabetes mellitus with diabetic polyneuropathy: Secondary | ICD-10-CM | POA: Diagnosis not present

## 2021-04-06 DIAGNOSIS — D509 Iron deficiency anemia, unspecified: Secondary | ICD-10-CM | POA: Diagnosis not present

## 2021-04-06 DIAGNOSIS — E1122 Type 2 diabetes mellitus with diabetic chronic kidney disease: Secondary | ICD-10-CM | POA: Diagnosis not present

## 2021-05-23 DIAGNOSIS — Z953 Presence of xenogenic heart valve: Secondary | ICD-10-CM | POA: Diagnosis not present

## 2021-05-23 DIAGNOSIS — I371 Nonrheumatic pulmonary valve insufficiency: Secondary | ICD-10-CM | POA: Diagnosis not present

## 2021-05-23 DIAGNOSIS — I10 Essential (primary) hypertension: Secondary | ICD-10-CM | POA: Diagnosis not present

## 2021-05-23 DIAGNOSIS — Z23 Encounter for immunization: Secondary | ICD-10-CM | POA: Diagnosis not present

## 2021-05-23 DIAGNOSIS — Z8679 Personal history of other diseases of the circulatory system: Secondary | ICD-10-CM | POA: Diagnosis not present

## 2021-05-23 DIAGNOSIS — I2510 Atherosclerotic heart disease of native coronary artery without angina pectoris: Secondary | ICD-10-CM | POA: Diagnosis not present

## 2021-05-23 DIAGNOSIS — E782 Mixed hyperlipidemia: Secondary | ICD-10-CM | POA: Diagnosis not present

## 2021-05-23 DIAGNOSIS — I739 Peripheral vascular disease, unspecified: Secondary | ICD-10-CM | POA: Diagnosis not present

## 2021-05-23 DIAGNOSIS — Z951 Presence of aortocoronary bypass graft: Secondary | ICD-10-CM | POA: Diagnosis not present

## 2021-05-23 DIAGNOSIS — Z9889 Other specified postprocedural states: Secondary | ICD-10-CM | POA: Diagnosis not present

## 2021-05-23 DIAGNOSIS — I5032 Chronic diastolic (congestive) heart failure: Secondary | ICD-10-CM | POA: Diagnosis not present

## 2021-06-17 DIAGNOSIS — I10 Essential (primary) hypertension: Secondary | ICD-10-CM | POA: Diagnosis not present

## 2021-06-17 DIAGNOSIS — E1122 Type 2 diabetes mellitus with diabetic chronic kidney disease: Secondary | ICD-10-CM | POA: Diagnosis not present

## 2021-06-17 DIAGNOSIS — G4700 Insomnia, unspecified: Secondary | ICD-10-CM | POA: Diagnosis not present

## 2021-06-17 DIAGNOSIS — N182 Chronic kidney disease, stage 2 (mild): Secondary | ICD-10-CM | POA: Diagnosis not present

## 2021-06-23 DIAGNOSIS — I2510 Atherosclerotic heart disease of native coronary artery without angina pectoris: Secondary | ICD-10-CM | POA: Diagnosis not present

## 2021-06-23 DIAGNOSIS — E1122 Type 2 diabetes mellitus with diabetic chronic kidney disease: Secondary | ICD-10-CM | POA: Diagnosis not present

## 2021-06-23 DIAGNOSIS — N182 Chronic kidney disease, stage 2 (mild): Secondary | ICD-10-CM | POA: Diagnosis not present

## 2021-06-23 DIAGNOSIS — G8929 Other chronic pain: Secondary | ICD-10-CM | POA: Diagnosis not present

## 2021-06-23 DIAGNOSIS — E7800 Pure hypercholesterolemia, unspecified: Secondary | ICD-10-CM | POA: Diagnosis not present

## 2021-06-23 DIAGNOSIS — I10 Essential (primary) hypertension: Secondary | ICD-10-CM | POA: Diagnosis not present

## 2021-06-30 DIAGNOSIS — D485 Neoplasm of uncertain behavior of skin: Secondary | ICD-10-CM | POA: Diagnosis not present

## 2021-06-30 DIAGNOSIS — B351 Tinea unguium: Secondary | ICD-10-CM | POA: Diagnosis not present

## 2021-06-30 DIAGNOSIS — B079 Viral wart, unspecified: Secondary | ICD-10-CM | POA: Diagnosis not present

## 2021-06-30 DIAGNOSIS — L821 Other seborrheic keratosis: Secondary | ICD-10-CM | POA: Diagnosis not present

## 2021-06-30 DIAGNOSIS — Z85828 Personal history of other malignant neoplasm of skin: Secondary | ICD-10-CM | POA: Diagnosis not present

## 2021-06-30 DIAGNOSIS — B353 Tinea pedis: Secondary | ICD-10-CM | POA: Diagnosis not present

## 2021-06-30 DIAGNOSIS — D2339 Other benign neoplasm of skin of other parts of face: Secondary | ICD-10-CM | POA: Diagnosis not present

## 2021-06-30 DIAGNOSIS — D225 Melanocytic nevi of trunk: Secondary | ICD-10-CM | POA: Diagnosis not present

## 2021-06-30 DIAGNOSIS — D1801 Hemangioma of skin and subcutaneous tissue: Secondary | ICD-10-CM | POA: Diagnosis not present

## 2021-06-30 DIAGNOSIS — C4359 Malignant melanoma of other part of trunk: Secondary | ICD-10-CM | POA: Diagnosis not present

## 2021-07-20 DIAGNOSIS — Z85820 Personal history of malignant melanoma of skin: Secondary | ICD-10-CM | POA: Diagnosis not present

## 2021-07-20 DIAGNOSIS — Z85828 Personal history of other malignant neoplasm of skin: Secondary | ICD-10-CM | POA: Diagnosis not present

## 2021-07-20 DIAGNOSIS — C4359 Malignant melanoma of other part of trunk: Secondary | ICD-10-CM | POA: Diagnosis not present

## 2021-07-20 DIAGNOSIS — L905 Scar conditions and fibrosis of skin: Secondary | ICD-10-CM | POA: Diagnosis not present

## 2021-08-08 DIAGNOSIS — I10 Essential (primary) hypertension: Secondary | ICD-10-CM | POA: Diagnosis not present

## 2021-08-08 DIAGNOSIS — N182 Chronic kidney disease, stage 2 (mild): Secondary | ICD-10-CM | POA: Diagnosis not present

## 2021-08-08 DIAGNOSIS — I4892 Unspecified atrial flutter: Secondary | ICD-10-CM | POA: Diagnosis not present

## 2021-08-08 DIAGNOSIS — Z1331 Encounter for screening for depression: Secondary | ICD-10-CM | POA: Diagnosis not present

## 2021-08-08 DIAGNOSIS — Z794 Long term (current) use of insulin: Secondary | ICD-10-CM | POA: Diagnosis not present

## 2021-08-08 DIAGNOSIS — D509 Iron deficiency anemia, unspecified: Secondary | ICD-10-CM | POA: Diagnosis not present

## 2021-08-08 DIAGNOSIS — Z0000 Encounter for general adult medical examination without abnormal findings: Secondary | ICD-10-CM | POA: Diagnosis not present

## 2021-08-08 DIAGNOSIS — K219 Gastro-esophageal reflux disease without esophagitis: Secondary | ICD-10-CM | POA: Diagnosis not present

## 2021-08-08 DIAGNOSIS — E7800 Pure hypercholesterolemia, unspecified: Secondary | ICD-10-CM | POA: Diagnosis not present

## 2021-08-08 DIAGNOSIS — Z8546 Personal history of malignant neoplasm of prostate: Secondary | ICD-10-CM | POA: Diagnosis not present

## 2021-08-08 DIAGNOSIS — E1142 Type 2 diabetes mellitus with diabetic polyneuropathy: Secondary | ICD-10-CM | POA: Diagnosis not present

## 2021-08-08 DIAGNOSIS — E1122 Type 2 diabetes mellitus with diabetic chronic kidney disease: Secondary | ICD-10-CM | POA: Diagnosis not present

## 2021-08-08 DIAGNOSIS — G629 Polyneuropathy, unspecified: Secondary | ICD-10-CM | POA: Diagnosis not present

## 2021-08-08 DIAGNOSIS — F325 Major depressive disorder, single episode, in full remission: Secondary | ICD-10-CM | POA: Diagnosis not present

## 2021-08-17 DIAGNOSIS — N289 Disorder of kidney and ureter, unspecified: Secondary | ICD-10-CM | POA: Diagnosis not present

## 2021-08-17 DIAGNOSIS — Z125 Encounter for screening for malignant neoplasm of prostate: Secondary | ICD-10-CM | POA: Diagnosis not present

## 2021-09-06 DIAGNOSIS — I2510 Atherosclerotic heart disease of native coronary artery without angina pectoris: Secondary | ICD-10-CM | POA: Diagnosis not present

## 2021-09-06 DIAGNOSIS — I10 Essential (primary) hypertension: Secondary | ICD-10-CM | POA: Diagnosis not present

## 2021-09-06 DIAGNOSIS — E1122 Type 2 diabetes mellitus with diabetic chronic kidney disease: Secondary | ICD-10-CM | POA: Diagnosis not present

## 2021-09-06 DIAGNOSIS — E7800 Pure hypercholesterolemia, unspecified: Secondary | ICD-10-CM | POA: Diagnosis not present

## 2021-09-06 DIAGNOSIS — F325 Major depressive disorder, single episode, in full remission: Secondary | ICD-10-CM | POA: Diagnosis not present

## 2021-09-09 DIAGNOSIS — Z5181 Encounter for therapeutic drug level monitoring: Secondary | ICD-10-CM | POA: Diagnosis not present

## 2021-10-26 DIAGNOSIS — Z85820 Personal history of malignant melanoma of skin: Secondary | ICD-10-CM | POA: Diagnosis not present

## 2021-10-26 DIAGNOSIS — D1801 Hemangioma of skin and subcutaneous tissue: Secondary | ICD-10-CM | POA: Diagnosis not present

## 2021-10-26 DIAGNOSIS — C44319 Basal cell carcinoma of skin of other parts of face: Secondary | ICD-10-CM | POA: Diagnosis not present

## 2021-10-26 DIAGNOSIS — L570 Actinic keratosis: Secondary | ICD-10-CM | POA: Diagnosis not present

## 2021-10-26 DIAGNOSIS — Z85828 Personal history of other malignant neoplasm of skin: Secondary | ICD-10-CM | POA: Diagnosis not present

## 2021-10-26 DIAGNOSIS — D485 Neoplasm of uncertain behavior of skin: Secondary | ICD-10-CM | POA: Diagnosis not present

## 2021-10-26 DIAGNOSIS — L821 Other seborrheic keratosis: Secondary | ICD-10-CM | POA: Diagnosis not present

## 2021-11-24 DIAGNOSIS — H16223 Keratoconjunctivitis sicca, not specified as Sjogren's, bilateral: Secondary | ICD-10-CM | POA: Diagnosis not present

## 2021-11-24 DIAGNOSIS — H18593 Other hereditary corneal dystrophies, bilateral: Secondary | ICD-10-CM | POA: Diagnosis not present

## 2021-11-24 DIAGNOSIS — H0288A Meibomian gland dysfunction right eye, upper and lower eyelids: Secondary | ICD-10-CM | POA: Diagnosis not present

## 2021-11-24 DIAGNOSIS — H04123 Dry eye syndrome of bilateral lacrimal glands: Secondary | ICD-10-CM | POA: Diagnosis not present

## 2021-11-24 DIAGNOSIS — H35033 Hypertensive retinopathy, bilateral: Secondary | ICD-10-CM | POA: Diagnosis not present

## 2021-12-12 DIAGNOSIS — E1122 Type 2 diabetes mellitus with diabetic chronic kidney disease: Secondary | ICD-10-CM | POA: Diagnosis not present

## 2021-12-12 DIAGNOSIS — R159 Full incontinence of feces: Secondary | ICD-10-CM | POA: Diagnosis not present

## 2021-12-12 DIAGNOSIS — G629 Polyneuropathy, unspecified: Secondary | ICD-10-CM | POA: Diagnosis not present

## 2021-12-12 DIAGNOSIS — I10 Essential (primary) hypertension: Secondary | ICD-10-CM | POA: Diagnosis not present

## 2021-12-20 DIAGNOSIS — Z23 Encounter for immunization: Secondary | ICD-10-CM | POA: Diagnosis not present

## 2022-02-27 DIAGNOSIS — L821 Other seborrheic keratosis: Secondary | ICD-10-CM | POA: Diagnosis not present

## 2022-02-27 DIAGNOSIS — D225 Melanocytic nevi of trunk: Secondary | ICD-10-CM | POA: Diagnosis not present

## 2022-02-27 DIAGNOSIS — Z85820 Personal history of malignant melanoma of skin: Secondary | ICD-10-CM | POA: Diagnosis not present

## 2022-02-27 DIAGNOSIS — Z85828 Personal history of other malignant neoplasm of skin: Secondary | ICD-10-CM | POA: Diagnosis not present

## 2022-02-27 DIAGNOSIS — D2362 Other benign neoplasm of skin of left upper limb, including shoulder: Secondary | ICD-10-CM | POA: Diagnosis not present

## 2022-02-27 DIAGNOSIS — D2262 Melanocytic nevi of left upper limb, including shoulder: Secondary | ICD-10-CM | POA: Diagnosis not present

## 2022-04-10 DIAGNOSIS — F325 Major depressive disorder, single episode, in full remission: Secondary | ICD-10-CM | POA: Diagnosis not present

## 2022-04-10 DIAGNOSIS — E1122 Type 2 diabetes mellitus with diabetic chronic kidney disease: Secondary | ICD-10-CM | POA: Diagnosis not present

## 2022-04-10 DIAGNOSIS — Z8546 Personal history of malignant neoplasm of prostate: Secondary | ICD-10-CM | POA: Diagnosis not present

## 2022-04-10 DIAGNOSIS — Z952 Presence of prosthetic heart valve: Secondary | ICD-10-CM | POA: Diagnosis not present

## 2022-04-10 DIAGNOSIS — D509 Iron deficiency anemia, unspecified: Secondary | ICD-10-CM | POA: Diagnosis not present

## 2022-04-10 DIAGNOSIS — E1142 Type 2 diabetes mellitus with diabetic polyneuropathy: Secondary | ICD-10-CM | POA: Diagnosis not present

## 2022-06-05 DIAGNOSIS — Z9889 Other specified postprocedural states: Secondary | ICD-10-CM | POA: Diagnosis not present

## 2022-06-05 DIAGNOSIS — Z953 Presence of xenogenic heart valve: Secondary | ICD-10-CM | POA: Diagnosis not present

## 2022-06-05 DIAGNOSIS — Z951 Presence of aortocoronary bypass graft: Secondary | ICD-10-CM | POA: Diagnosis not present

## 2022-06-05 DIAGNOSIS — I371 Nonrheumatic pulmonary valve insufficiency: Secondary | ICD-10-CM | POA: Diagnosis not present

## 2022-06-05 DIAGNOSIS — I739 Peripheral vascular disease, unspecified: Secondary | ICD-10-CM | POA: Diagnosis not present

## 2022-06-05 DIAGNOSIS — Z8679 Personal history of other diseases of the circulatory system: Secondary | ICD-10-CM | POA: Diagnosis not present

## 2022-06-05 DIAGNOSIS — G6289 Other specified polyneuropathies: Secondary | ICD-10-CM | POA: Diagnosis not present

## 2022-06-05 DIAGNOSIS — I5032 Chronic diastolic (congestive) heart failure: Secondary | ICD-10-CM | POA: Diagnosis not present

## 2022-06-05 DIAGNOSIS — I2510 Atherosclerotic heart disease of native coronary artery without angina pectoris: Secondary | ICD-10-CM | POA: Diagnosis not present

## 2022-06-05 DIAGNOSIS — I10 Essential (primary) hypertension: Secondary | ICD-10-CM | POA: Diagnosis not present

## 2022-06-05 DIAGNOSIS — E782 Mixed hyperlipidemia: Secondary | ICD-10-CM | POA: Diagnosis not present

## 2022-07-24 ENCOUNTER — Emergency Department (HOSPITAL_COMMUNITY): Payer: Medicare Other

## 2022-07-24 ENCOUNTER — Encounter (HOSPITAL_COMMUNITY): Payer: Self-pay

## 2022-07-24 ENCOUNTER — Inpatient Hospital Stay (HOSPITAL_COMMUNITY)
Admission: EM | Admit: 2022-07-24 | Discharge: 2022-07-27 | DRG: 871 | Disposition: A | Discharge status: Home Health | Payer: Medicare Other | Attending: Internal Medicine | Admitting: Internal Medicine

## 2022-07-24 ENCOUNTER — Other Ambulatory Visit: Payer: Self-pay

## 2022-07-24 DIAGNOSIS — G4733 Obstructive sleep apnea (adult) (pediatric): Secondary | ICD-10-CM | POA: Diagnosis present

## 2022-07-24 DIAGNOSIS — K3189 Other diseases of stomach and duodenum: Secondary | ICD-10-CM | POA: Diagnosis not present

## 2022-07-24 DIAGNOSIS — R739 Hyperglycemia, unspecified: Secondary | ICD-10-CM | POA: Diagnosis not present

## 2022-07-24 DIAGNOSIS — Z8546 Personal history of malignant neoplasm of prostate: Secondary | ICD-10-CM

## 2022-07-24 DIAGNOSIS — Z7984 Long term (current) use of oral hypoglycemic drugs: Secondary | ICD-10-CM | POA: Diagnosis not present

## 2022-07-24 DIAGNOSIS — Z823 Family history of stroke: Secondary | ICD-10-CM

## 2022-07-24 DIAGNOSIS — A419 Sepsis, unspecified organism: Principal | ICD-10-CM

## 2022-07-24 DIAGNOSIS — I119 Hypertensive heart disease without heart failure: Secondary | ICD-10-CM | POA: Diagnosis present

## 2022-07-24 DIAGNOSIS — D638 Anemia in other chronic diseases classified elsewhere: Secondary | ICD-10-CM | POA: Diagnosis present

## 2022-07-24 DIAGNOSIS — W19XXXA Unspecified fall, initial encounter: Secondary | ICD-10-CM | POA: Diagnosis present

## 2022-07-24 DIAGNOSIS — Z87891 Personal history of nicotine dependence: Secondary | ICD-10-CM

## 2022-07-24 DIAGNOSIS — E1152 Type 2 diabetes mellitus with diabetic peripheral angiopathy with gangrene: Secondary | ICD-10-CM | POA: Diagnosis present

## 2022-07-24 DIAGNOSIS — E785 Hyperlipidemia, unspecified: Secondary | ICD-10-CM | POA: Diagnosis present

## 2022-07-24 DIAGNOSIS — Z955 Presence of coronary angioplasty implant and graft: Secondary | ICD-10-CM | POA: Diagnosis not present

## 2022-07-24 DIAGNOSIS — T5494XA Toxic effect of unspecified corrosive substance, undetermined, initial encounter: Secondary | ICD-10-CM | POA: Diagnosis present

## 2022-07-24 DIAGNOSIS — R0902 Hypoxemia: Secondary | ICD-10-CM

## 2022-07-24 DIAGNOSIS — I2489 Other forms of acute ischemic heart disease: Secondary | ICD-10-CM | POA: Diagnosis present

## 2022-07-24 DIAGNOSIS — Z951 Presence of aortocoronary bypass graft: Secondary | ICD-10-CM | POA: Diagnosis not present

## 2022-07-24 DIAGNOSIS — Z807 Family history of other malignant neoplasms of lymphoid, hematopoietic and related tissues: Secondary | ICD-10-CM

## 2022-07-24 DIAGNOSIS — I2510 Atherosclerotic heart disease of native coronary artery without angina pectoris: Secondary | ICD-10-CM | POA: Diagnosis present

## 2022-07-24 DIAGNOSIS — K219 Gastro-esophageal reflux disease without esophagitis: Secondary | ICD-10-CM | POA: Diagnosis present

## 2022-07-24 DIAGNOSIS — E1165 Type 2 diabetes mellitus with hyperglycemia: Secondary | ICD-10-CM

## 2022-07-24 DIAGNOSIS — Z043 Encounter for examination and observation following other accident: Secondary | ICD-10-CM | POA: Diagnosis not present

## 2022-07-24 DIAGNOSIS — Z8249 Family history of ischemic heart disease and other diseases of the circulatory system: Secondary | ICD-10-CM

## 2022-07-24 DIAGNOSIS — E119 Type 2 diabetes mellitus without complications: Secondary | ICD-10-CM

## 2022-07-24 DIAGNOSIS — R296 Repeated falls: Secondary | ICD-10-CM | POA: Diagnosis present

## 2022-07-24 DIAGNOSIS — K449 Diaphragmatic hernia without obstruction or gangrene: Secondary | ICD-10-CM | POA: Diagnosis not present

## 2022-07-24 DIAGNOSIS — J209 Acute bronchitis, unspecified: Secondary | ICD-10-CM

## 2022-07-24 DIAGNOSIS — Z833 Family history of diabetes mellitus: Secondary | ICD-10-CM

## 2022-07-24 DIAGNOSIS — R4182 Altered mental status, unspecified: Secondary | ICD-10-CM | POA: Diagnosis not present

## 2022-07-24 DIAGNOSIS — K2970 Gastritis, unspecified, without bleeding: Secondary | ICD-10-CM | POA: Diagnosis not present

## 2022-07-24 DIAGNOSIS — D500 Iron deficiency anemia secondary to blood loss (chronic): Secondary | ICD-10-CM | POA: Diagnosis present

## 2022-07-24 DIAGNOSIS — R509 Fever, unspecified: Secondary | ICD-10-CM | POA: Diagnosis not present

## 2022-07-24 DIAGNOSIS — R531 Weakness: Secondary | ICD-10-CM | POA: Diagnosis not present

## 2022-07-24 DIAGNOSIS — R6520 Severe sepsis without septic shock: Secondary | ICD-10-CM

## 2022-07-24 DIAGNOSIS — J9601 Acute respiratory failure with hypoxia: Secondary | ICD-10-CM | POA: Diagnosis present

## 2022-07-24 DIAGNOSIS — Z953 Presence of xenogenic heart valve: Secondary | ICD-10-CM

## 2022-07-24 DIAGNOSIS — Z85828 Personal history of other malignant neoplasm of skin: Secondary | ICD-10-CM | POA: Diagnosis not present

## 2022-07-24 DIAGNOSIS — R7989 Other specified abnormal findings of blood chemistry: Secondary | ICD-10-CM | POA: Diagnosis not present

## 2022-07-24 DIAGNOSIS — T39015A Adverse effect of aspirin, initial encounter: Secondary | ICD-10-CM | POA: Diagnosis present

## 2022-07-24 DIAGNOSIS — Z1152 Encounter for screening for COVID-19: Secondary | ICD-10-CM | POA: Diagnosis not present

## 2022-07-24 DIAGNOSIS — I10 Essential (primary) hypertension: Secondary | ICD-10-CM | POA: Diagnosis not present

## 2022-07-24 DIAGNOSIS — R059 Cough, unspecified: Secondary | ICD-10-CM

## 2022-07-24 DIAGNOSIS — Z87442 Personal history of urinary calculi: Secondary | ICD-10-CM

## 2022-07-24 DIAGNOSIS — Z794 Long term (current) use of insulin: Secondary | ICD-10-CM | POA: Diagnosis not present

## 2022-07-24 DIAGNOSIS — Z8673 Personal history of transient ischemic attack (TIA), and cerebral infarction without residual deficits: Secondary | ICD-10-CM

## 2022-07-24 DIAGNOSIS — D649 Anemia, unspecified: Secondary | ICD-10-CM | POA: Diagnosis not present

## 2022-07-24 DIAGNOSIS — Z79899 Other long term (current) drug therapy: Secondary | ICD-10-CM | POA: Diagnosis not present

## 2022-07-24 DIAGNOSIS — T287XXA Corrosion of other parts of alimentary tract, initial encounter: Secondary | ICD-10-CM | POA: Diagnosis present

## 2022-07-24 DIAGNOSIS — R1310 Dysphagia, unspecified: Secondary | ICD-10-CM | POA: Diagnosis not present

## 2022-07-24 DIAGNOSIS — F32A Depression, unspecified: Secondary | ICD-10-CM | POA: Diagnosis not present

## 2022-07-24 DIAGNOSIS — K921 Melena: Secondary | ICD-10-CM | POA: Diagnosis not present

## 2022-07-24 DIAGNOSIS — E782 Mixed hyperlipidemia: Secondary | ICD-10-CM

## 2022-07-24 DIAGNOSIS — Z9079 Acquired absence of other genital organ(s): Secondary | ICD-10-CM

## 2022-07-24 DIAGNOSIS — R000 Tachycardia, unspecified: Secondary | ICD-10-CM | POA: Diagnosis not present

## 2022-07-24 LAB — CBC WITH DIFFERENTIAL/PLATELET
Abs Immature Granulocytes: 0.06 10*3/uL (ref 0.00–0.07)
Basophils Absolute: 0 10*3/uL (ref 0.0–0.1)
Basophils Relative: 0 %
Eosinophils Absolute: 0.1 10*3/uL (ref 0.0–0.5)
Eosinophils Relative: 0 %
HCT: 25.7 % — ABNORMAL LOW (ref 39.0–52.0)
Hemoglobin: 8.3 g/dL — ABNORMAL LOW (ref 13.0–17.0)
Immature Granulocytes: 1 %
Lymphocytes Relative: 6 %
Lymphs Abs: 0.8 10*3/uL (ref 0.7–4.0)
MCH: 30.1 pg (ref 26.0–34.0)
MCHC: 32.3 g/dL (ref 30.0–36.0)
MCV: 93.1 fL (ref 80.0–100.0)
Monocytes Absolute: 0.9 10*3/uL (ref 0.1–1.0)
Monocytes Relative: 8 %
Neutro Abs: 9.9 10*3/uL — ABNORMAL HIGH (ref 1.7–7.7)
Neutrophils Relative %: 85 %
Platelets: 245 10*3/uL (ref 150–400)
RBC: 2.76 MIL/uL — ABNORMAL LOW (ref 4.22–5.81)
RDW: 15.1 % (ref 11.5–15.5)
WBC: 11.6 10*3/uL — ABNORMAL HIGH (ref 4.0–10.5)
nRBC: 0 % (ref 0.0–0.2)

## 2022-07-24 LAB — TROPONIN I (HIGH SENSITIVITY)
Troponin I (High Sensitivity): 25 ng/L — ABNORMAL HIGH (ref <18)
Troponin I (High Sensitivity): 62 ng/L — ABNORMAL HIGH (ref <18)

## 2022-07-24 LAB — PROTIME-INR
INR: 1.2 (ref 0.8–1.2)
Prothrombin Time: 15.2 seconds (ref 11.4–15.2)

## 2022-07-24 LAB — RESP PANEL BY RT-PCR (RSV, FLU A&B, COVID)  RVPGX2
Influenza A by PCR: NEGATIVE
Influenza B by PCR: NEGATIVE
Resp Syncytial Virus by PCR: NEGATIVE
SARS Coronavirus 2 by RT PCR: NEGATIVE

## 2022-07-24 LAB — URINALYSIS, W/ REFLEX TO CULTURE (INFECTION SUSPECTED)
Bacteria, UA: NONE SEEN
Bilirubin Urine: NEGATIVE
Glucose, UA: 50 mg/dL — AB
Ketones, ur: 5 mg/dL — AB
Leukocytes,Ua: NEGATIVE
Nitrite: NEGATIVE
Protein, ur: 100 mg/dL — AB
Specific Gravity, Urine: 1.012 (ref 1.005–1.030)
pH: 5 (ref 5.0–8.0)

## 2022-07-24 LAB — COMPREHENSIVE METABOLIC PANEL
ALT: 41 U/L (ref 0–44)
AST: 65 U/L — ABNORMAL HIGH (ref 15–41)
Albumin: 3.9 g/dL (ref 3.5–5.0)
Alkaline Phosphatase: 64 U/L (ref 38–126)
Anion gap: 11 (ref 5–15)
BUN: 23 mg/dL (ref 8–23)
CO2: 21 mmol/L — ABNORMAL LOW (ref 22–32)
Calcium: 8.9 mg/dL (ref 8.9–10.3)
Chloride: 103 mmol/L (ref 98–111)
Creatinine, Ser: 1.25 mg/dL — ABNORMAL HIGH (ref 0.61–1.24)
GFR, Estimated: 57 mL/min — ABNORMAL LOW (ref >60)
Glucose, Bld: 237 mg/dL — ABNORMAL HIGH (ref 70–99)
Potassium: 4.6 mmol/L (ref 3.5–5.1)
Sodium: 135 mmol/L (ref 135–145)
Total Bilirubin: 0.8 mg/dL (ref 0.3–1.2)
Total Protein: 7.8 g/dL (ref 6.5–8.1)

## 2022-07-24 LAB — BRAIN NATRIURETIC PEPTIDE: B Natriuretic Peptide: 282.6 pg/mL — ABNORMAL HIGH (ref 0.0–100.0)

## 2022-07-24 LAB — MAGNESIUM: Magnesium: 2.2 mg/dL (ref 1.7–2.4)

## 2022-07-24 LAB — LACTIC ACID, PLASMA
Lactic Acid, Venous: 2.1 mmol/L (ref 0.5–1.9)
Lactic Acid, Venous: 0.9 mmol/L (ref 0.5–1.9)

## 2022-07-24 MED ORDER — SODIUM CHLORIDE 0.9 % IV SOLN
2.0000 g | Freq: Once | INTRAVENOUS | Status: AC
  Administered 2022-07-24: 2 g via INTRAVENOUS
  Filled 2022-07-24: qty 12.5

## 2022-07-24 MED ORDER — VANCOMYCIN HCL 1500 MG/300ML IV SOLN
1500.0000 mg | Freq: Once | INTRAVENOUS | Status: AC
  Administered 2022-07-24: 1500 mg via INTRAVENOUS
  Filled 2022-07-24: qty 300

## 2022-07-24 MED ORDER — SODIUM CHLORIDE 0.9 % IV SOLN
500.0000 mg | INTRAVENOUS | Status: DC
  Administered 2022-07-24 – 2022-07-26 (×3): 500 mg via INTRAVENOUS
  Filled 2022-07-24 (×3): qty 5

## 2022-07-24 MED ORDER — ONDANSETRON HCL 4 MG/2ML IJ SOLN: 4.0000 mg | Freq: Four times a day (QID) | INTRAMUSCULAR | Status: DC | PRN

## 2022-07-24 MED ORDER — ALBUTEROL SULFATE (2.5 MG/3ML) 0.083% IN NEBU: 2.5000 mg | INHALATION_SOLUTION | RESPIRATORY_TRACT | Status: DC | PRN

## 2022-07-24 MED ORDER — IPRATROPIUM-ALBUTEROL 0.5-2.5 (3) MG/3ML IN SOLN
3.0000 mL | Freq: Four times a day (QID) | RESPIRATORY_TRACT | Status: DC
  Administered 2022-07-25: 3 mL via RESPIRATORY_TRACT
  Filled 2022-07-24: qty 3

## 2022-07-24 MED ORDER — ACETAMINOPHEN 650 MG RE SUPP: 650.0000 mg | Freq: Four times a day (QID) | RECTAL | Status: DC | PRN

## 2022-07-24 MED ORDER — MELATONIN 3 MG PO TABS
3.0000 mg | ORAL_TABLET | Freq: Every evening | ORAL | Status: DC | PRN
  Administered 2022-07-24 – 2022-07-26 (×3): 3 mg via ORAL
  Filled 2022-07-24 (×3): qty 1

## 2022-07-24 MED ORDER — SODIUM CHLORIDE 0.9 % IV BOLUS
1000.0000 mL | Freq: Once | INTRAVENOUS | Status: AC
  Administered 2022-07-24: 1000 mL via INTRAVENOUS

## 2022-07-24 MED ORDER — SODIUM CHLORIDE 0.9 % IV SOLN: 1.0000 g | INTRAVENOUS | Status: DC

## 2022-07-24 MED ORDER — LACTATED RINGERS IV SOLN: INTRAVENOUS | Status: AC

## 2022-07-24 MED ORDER — ACETAMINOPHEN 325 MG PO TABS
650.0000 mg | ORAL_TABLET | Freq: Four times a day (QID) | ORAL | Status: DC | PRN
  Administered 2022-07-25: 650 mg via ORAL
  Filled 2022-07-24: qty 2

## 2022-07-24 MED ORDER — ACETAMINOPHEN 500 MG PO TABS
1000.0000 mg | ORAL_TABLET | Freq: Once | ORAL | Status: AC
  Administered 2022-07-24: 1000 mg via ORAL
  Filled 2022-07-24: qty 2

## 2022-07-24 NOTE — H&P (Signed)
History and Physical      JAKEEL YOU ZOX:[id number] DOB: 19-Dec-1935 DOA: 07/24/2022; DOS: 07/24/2022  PCP: Pcp, No *** Patient coming from: home ***  I have personally briefly reviewed patient's old medical records in [facility]  Chief Complaint: ***  HPI: KAMARR ARTS is a 87 y.o. male with medical history significant for *** who is admitted to [facility] on 07/24/2022 with *** after presenting from home*** to [facility] ED complaining of ***.   ***        ***  ED Course:  Vital signs in the ED were notable for the following: ***  Labs were notable for the following: ***  Per my interpretation, EKG in ED demonstrated the following:  ***  Imaging and additional notable ED work-up: ***  While in the ED, the following were administered: ***  Subsequently, the patient was admitted  ***  ***red   Review of Systems: As per HPI otherwise 10 point review of systems negative.   Past Medical History:  Diagnosis Date   Anemia    Anginal pain (HCC)    Atrial flutter (H[facility])    Bladder outlet obstruction    Carotid artery occlusion    Coronary artery disease    post stents; prior CABG; s/p cath January 2014 with 2 VD, patent SVG to OM3 and patent SVG to PL patent   Degenerative disk disease    GERD (gastroesophageal reflux disease)    History of esophageal stricture    "has it stretched q once in awhile" (03/26/2012"   Hyperlipidemia    Hypertension    Kidney stones    Major depression    OSA (obstructive sleep apnea)    "have a mask; haven't used one in years" (03/26/2012)   Prostate cancer (HCC)    Skin cancer of nose 2012   "right side" (03/26/2012)   Skin cancer of trunk    "left chest" (03/26/2012)   Spinal stenosis    Stroke (HCC) 2014   NO RESIDUAL PROBLEMS   Type II diabetes mellitus (HCC)    Valvular heart disease     Past Surgical History:  Procedure Laterality Date   A-FLUTTER ABLATION N/A 12/01/2016   Procedure: A-Flutter Ablation;   Surgeon: Hillis Range, MD;  Location: [facility] INVAS[facility];  Service: Cardiovascular;  Laterality: N/A;   ADENOIDECTOMY  1956   ANTERIOR CERVICAL DECOMP/DISCECTOMY FUSION  2000's   AORTIC VALVE REPLACEMENT  08/29/2010   25 mm CE Magna bioprosthetic - [facility]   APPENDECTOMY  1951   CARDIAC CATHETERIZATION  10/20/2003   Est. EF of 65% -- Critical disease in the mid to distal circumflex --  Preserved left ventricular  function --    CARDIAC VALVE REPLACEMENT     CAROTID ENDARTERECTOMY  05/05/2003   right carotid endarterectomy   CERVICAL DISC SURGERY  1980's   CORONARY ANGIOPLASTY WITH STENT PLACEMENT  10/20/2003   Percutaneous coronary intervention/drug-eluding stent implantation, third marginal branch --  Percutaneous closure, right femoral artery -- Atherosclerotic coronary vascular disease, single vessel / Status post successful percutaneous coronary intervention/tandem drug -- eluding stent implantation proximal third marginal branch -- Typical angina was not reproduced with device insertion or balloon    CORONARY ARTERY BYPASS GRAFT  08/29/2010   CABG X2; SVG to PDA, SVG to OM2- [facility]   HEMATOMA EVACUATION  05/09/2003   Evacuation of hematoma, right neck   LEFT HEART CATHETERIZATION WITH CORONARY ANGIOGRAM N/A 03/27/2012   Procedure: LEFT HEART CATHETERIZATION  WITH CORONARY ANGIOGRAM;  Surgeon: Peter M Swaziland, MD;  Location: [facility] CATH LAB;  Service: Cardiovascular;  Laterality: N/A;   LUMBAR DISC SURGERY  1996   MITRAL VALVE REPAIR  08/23/2010   28 mm Simulus ring -[facility]   POSTERIOR FUSION LUMBAR SPINE  1997   PROSTATE BIOPSY     SHOULDER ARTHROSCOPY  01/07/2007   Left shoulder impingement, labral tear   SKIN CANCER EXCISION  1990's; 2012   "left chest & right nose" (03/26/2012)   TEE WITHOUT CARDIOVERSION N/A 05/15/2012   Procedure: TRANSESOPHAGEAL ECHOCARDIOGRAM (TEE);  Surgeon: Vesta Mixer, MD;  Location: [facility] ENDOSCOPY;  Service: Cardiovascular;  Laterality: N/A;   TEE WITHOUT  CARDIOVERSION N/A 12/01/2016   Procedure: TRANSESOPHAGEAL ECHOCARDIOGRAM (TEE);  Surgeon: Pricilla Riffle, MD;  Location: [facility] ENDOSCOPY;  Service: Cardiovascular;  Laterality: N/A;   TONSILLECTOMY AND ADENOIDECTOMY  1956   TRANSURETHRAL RESECTION OF PROSTATE N/A 04/26/2015   Procedure: TRANSURETHRAL RESECTION OF THE PROSTATE WITH GYRUS INSTRUMENTS;  Surgeon: Crist Fat, MD;  Location: [facility];  Service: Urology;  Laterality: N/A;  TURP-BIPOLAR    Social History:  reports that he quit smoking about 43 years ago. His smoking use included cigarettes. He has a 115.00 pack-year smoking history. He has never used smokeless tobacco. He reports that he does not drink alcohol and does not use drugs.   No Known Allergies  Family History  Problem Relation Age of Onset   Diabetes Mother    Coronary artery disease Mother    Heart disease Mother    Stroke Father    Cancer Father        prostate   Cancer Daughter        lymphoma hodgkins    Seizures Daughter     Family history reviewed and not pertinent ***   Prior to Admission medications   Medication Sig Start Date End Date Taking? Authorizing Provider  FLUoxetine (PROZAC) 20 MG capsule Take 20 mg by mouth at bedtime.     [provider]  furosemide (LASIX) 20 MG tablet Take 1 tablet (20 mg total) by mouth daily as needed for fluid. 12/28/16   Allred, Fayrene Fearing, MD  insulin lispro protamine-lispro (HUMALOG 75/25 MIX) (75-25) 100 UNIT/ML SUSP injection Inject 18 Units into the skin 2 (two) times daily with a meal.    [provider]  losartan (COZAAR) 25 MG tablet Take 1 tablet by mouth once daily 06/28/18   Allred, Fayrene Fearing, MD  Melatonin 3 MG TABS Take 1 tablet by mouth at bedtime.     [provider]  metFORMIN (GLUMETZA) 1000 MG (MOD) 24 hr tablet Take 1 tablet (1,000 mg total) by mouth 2 (two) times daily with a meal. 03/29/12   Arguello, Roger A, PA-C  metoprolol succinate (TOPROL-XL) 50 MG 24 hr tablet Take 1 tablet  (50 mg total) by mouth daily. 12/25/16   Allred, Fayrene Fearing, MD  Multiple Vitamin (MULTIVITAMIN) tablet Take 1 tablet by mouth daily.    [provider]  nitroGLYCERIN (NITROSTAT) 0.4 MG SL tablet Place 1 tablet (0.4 mg total) under the tongue every 5 (five) minutes as needed. For chest pain 03/28/12   [name], Deloris Ping, MD  omeprazole (PRILOSEC) 20 MG capsule Take 20 mg by mouth every morning.    [provider]  potassium chloride SA (K-DUR,KLOR-CON) 20 MEQ tablet Take 1 tablet (20 mEq total) by mouth daily. 12/20/16   Newman Nip, NP  rosuvastatin (CRESTOR) 10 MG tablet Take 10 mg by  mouth every morning.     [provider]  vitamin C (ASCORBIC ACID) 500 MG tablet Take 500 mg by mouth 2 (two) times daily.      [provider]  zolpidem (AMBIEN) 10 MG tablet Take 5 mg by mouth at bedtime.     [provider]     Objective    Physical Exam: Vitals:   07/24/22 2130 07/24/22 2145 07/24/22 2200 07/24/22 2215  BP: 116/70 116/62 (!) 132/57 (!) 123/55  Pulse: 88 85 80 84  Resp: (!) 22 (!) 28 (!) 28 (!) 29  Temp:      TempSrc:      SpO2: 95% 95% 95% 96%  Weight:      Height:        General: appears to be stated age; alert, oriented Skin: warm, dry, no rash Head:  AT/[place] Mouth:  Oral mucosa membranes appear moist, normal dentition Neck: supple; trachea midline Heart:  RRR; did not appreciate any M/R/G Lungs: CTAB, did not appreciate any wheezes, rales, or rhonchi Abdomen: + BS; soft, ND, NT Vascular: 2+ pedal pulses b/l; 2+ radial pulses b/l Extremities: no peripheral edema, no muscle wasting Neuro: strength and sensation intact in upper and lower extremities b/l    *** Neuro: 5/5 strength of the proximal and distal flexors and extensors of the upper and lower extremities bilaterally; sensation intact in upper and lower extremities b/l; cranial nerves II through XII grossly intact; no pronator drift; no evidence suggestive of slurred speech,  dysarthria, or facial droop; Normal muscle tone. No tremors. *** Neuro: In the setting of the patient's current mental status and associated inability to follow instructions, unable to perform full neurologic exam at this time.  As such, assessment of strength, sensation, and cranial nerves is limited at this time. Patient noted to spontaneously move all 4 extremities. No tremors.  ***    Labs on Admission: I have personally reviewed following labs and imaging studies  CBC: Recent Labs  Lab 07/24/22 2016  WBC 11.6*  NEUTROABS 9.9*  HGB 8.3*  HCT 25.7*  MCV 93.1  PLT 245   Basic Metabolic Panel: Recent Labs  Lab 07/24/22 2016  NA 135  K 4.6  CL 103  CO2 21*  GLUCOSE 237*  BUN 23  CREATININE 1.25*  CALCIUM 8.9   GFR: Estimated Creatinine Clearance: 39.7 mL/min (A) (by C-G formula based on SCr of 1.25 mg/dL (H)). Liver Function Tests: Recent Labs  Lab 07/24/22 2016  AST 65*  ALT 41  ALKPHOS 64  BILITOT 0.8  PROT 7.8  ALBUMIN 3.9   No results for input(s): "LIPASE", "AMYLASE" in the last 168 hours. No results for input(s): "AMMONIA" in the last 168 hours. Coagulation Profile: Recent Labs  Lab 07/24/22 2016  INR 1.2   Cardiac Enzymes: No results for input(s): "CKTOTAL", "CKMB", "CKMBINDEX", "TROPONINI" in the last 168 hours. BNP (last 3 results) No results for input(s): "PROBNP" in the last 8760 hours. HbA1C: No results for input(s): "HGBA1C" in the last 72 hours. CBG: No results for input(s): "GLUCAP" in the last 168 hours. Lipid Profile: No results for input(s): "CHOL", "HDL", "LDLCALC", "TRIG", "CHOLHDL", "LDLDIRECT" in the last 72 hours. Thyroid Function Tests: No results for input(s): "TSH", "T4TOTAL", "FREET4", "T3FREE", "THYROIDAB" in the last 72 hours. Anemia Panel: No results for input(s): "VITAMINB12", "FOLATE", "FERRITIN", "TIBC", "IRON", "RETICCTPCT" in the last 72 hours. Urine analysis:    Component Value Date/Time   COLORURINE YELLOW  07/24/2022 2011   APPEARANCEUR  CLEAR 07/24/2022 2011   LABSPEC 1.012 07/24/2022 2011   LABSPEC 1.010 12/02/2015 1119   PHURINE 5.0 07/24/2022 2011   GLUCOSEU 50 (A) 07/24/2022 2011   GLUCOSEU 1,000 12/02/2015 1119   HGBUR SMALL (A) 07/24/2022 2011   BILIRUBINUR NEGATIVE 07/24/2022 2011   BILIRUBINUR Negative 12/02/2015 1119   KETONESUR 5 (A) 07/24/2022 2011   PROTEINUR 100 (A) 07/24/2022 2011   UROBILINOGEN 0.2 12/02/2015 1119   NITRITE NEGATIVE 07/24/2022 2011   LEUKOCYTESUR NEGATIVE 07/24/2022 2011   LEUKOCYTESUR Negative 12/02/2015 1119    Radiological Exams on Admission: CT Head Wo Contrast  Result Date: 07/24/2022 CLINICAL DATA:  Mental status change, fall. EXAM: CT HEAD WITHOUT CONTRAST TECHNIQUE: Contiguous axial images were obtained from the base of the skull through the vertex without intravenous contrast. RADIATION DOSE REDUCTION: This exam was performed according to the departmental dose-optimization program which includes automated exposure control, adjustment of the mA and/or kV according to patient size and/or use of iterative reconstruction technique. COMPARISON:  Head CT 06/16/2003.  MRI brain 04/12/2016. FINDINGS: Brain: No evidence of acute infarction, hemorrhage, hydrocephalus, extra-axial collection or mass lesion/mass effect. 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 Mild diffuse atrophy is unchanged. Vascular: Atherosclerotic calcifications are present within the cavernous internal carotid arteries. Skull: Normal. Negative for fracture or focal lesion. Sinuses/Orbits: No acute finding. Other: None. IMPRESSION: 1. No acute intracranial abnormality. 2. 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 3. Mild diffuse atrophy is unchanged. Electronically Signed   By: Darliss Cheney M.D.   On: 07/24/2022 21:15   DG Chest 2 View  Result Date: 07/24/2022 CLINICAL DATA:  Suspected sepsis, fall. EXAM: CHEST - 2 VIEW COMPARISON:   12/05/2016. FINDINGS: Heart is enlarged and the mediastinal contour is within normal limits. 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 There is atherosclerotic calcification of the aorta. Lung volumes are low and mild interstitial prominence is noted bilaterally. No effusion or pneumothorax. Sternotomy wires and cervical spinal fusion hardware are noted. Degenerative changes are present in the thoracic spine. IMPRESSION: 1. Cardiomegaly. 2. Interstitial prominence bilaterally, which may be accentuated due to low lung volumes. Electronically Signed   By: Thornell Sartorius M.D.   On: 07/24/2022 20:43      Assessment/Plan    Principal Problem:   Acute bronchitis  ***      ***          ***           ***          ***          ***          ***          ***          ***          ***          ***          ***          ***          ***     DVT prophylaxis: SCD's ***  Code Status: Full code*** Family Communication: none*** Disposition Plan: Per Rounding Team Consults called: none***;  Admission status: ***    I SPENT GREATER THAN 75 *** MINUTES IN CLINICAL CARE TIME/MEDICAL DECISION-MAKING IN COMPLETING THIS ADMISSION.     Chaney Born Sylvie Mifsud DO [facility] From 7PM - 7AM   07/24/2022, 10:26 PM   ***

## 2022-07-24 NOTE — Progress Notes (Signed)
A consult was received from an ED physician for cefepime and vancomycin per pharmacy dosing.  The patient's profile has been reviewed for ht/wt/allergies/indication/available labs.    A one time order has been placed for cefepime 2 g IV + vancomycin 1500 mg IV.    Further antibiotics/pharmacy consults should be ordered by admitting physician if indicated.                       Thank you, Cindi Carbon, PharmD 07/24/2022  8:29 PM

## 2022-07-24 NOTE — ED Provider Notes (Signed)
[place] EMERGENCY DEPARTMENT AT [facility] Provider Note   CSN: [id number] Arrival date & time: 07/24/22  2001     History  Chief Complaint  Patient presents with   [name] is a 87 y.o. male.  87 year old male with prior medical history as detailed below presents for evaluation.  Patient arrives from home after developing fever and generalized weakness earlier today.  Patient reports that he "has been feeling rotten since yesterday".  Patient was unable to walk this afternoon which caused his wife to notify EMS for transport.  Patient is pleasantly confused.  He complains of feeling "rotten".  Patient noted to be febrile to 102.9 on arrival.  He complains of generalized weakness and fatigue.  He complains of mild cough.  The history is provided by the patient and medical records.       Home Medications Prior to Admission medications   Medication Sig Start Date End Date Taking? Authorizing Provider  FLUoxetine (PROZAC) 20 MG capsule Take 20 mg by mouth at bedtime.     [provider]  furosemide (LASIX) 20 MG tablet Take 1 tablet (20 mg total) by mouth daily as needed for fluid. 12/28/16   Allred, Fayrene Fearing, MD  insulin lispro protamine-lispro (HUMALOG 75/25 MIX) (75-25) 100 UNIT/ML SUSP injection Inject 18 Units into the skin 2 (two) times daily with a meal.    [provider]  losartan (COZAAR) 25 MG tablet Take 1 tablet by mouth once daily 06/28/18   Allred, Fayrene Fearing, MD  Melatonin 3 MG TABS Take 1 tablet by mouth at bedtime.     [provider]  metFORMIN (GLUMETZA) 1000 MG (MOD) 24 hr tablet Take 1 tablet (1,000 mg total) by mouth 2 (two) times daily with a meal. 03/29/12   Arguello, Roger A, PA-C  metoprolol succinate (TOPROL-XL) 50 MG 24 hr tablet Take 1 tablet (50 mg total) by mouth daily. 12/25/16   Allred, Fayrene Fearing, MD  Multiple Vitamin (MULTIVITAMIN) tablet Take 1 tablet by mouth daily.    [provider]   nitroGLYCERIN (NITROSTAT) 0.4 MG SL tablet Place 1 tablet (0.4 mg total) under the tongue every 5 (five) minutes as needed. For chest pain 03/28/12   [name], Deloris Ping, MD  omeprazole (PRILOSEC) 20 MG capsule Take 20 mg by mouth every morning.    [provider]  potassium chloride SA (K-DUR,KLOR-CON) 20 MEQ tablet Take 1 tablet (20 mEq total) by mouth daily. 12/20/16   Newman Nip, NP  rosuvastatin (CRESTOR) 10 MG tablet Take 10 mg by mouth every morning.     [provider]  vitamin C (ASCORBIC ACID) 500 MG tablet Take 500 mg by mouth 2 (two) times daily.      [provider]  zolpidem (AMBIEN) 10 MG tablet Take 5 mg by mouth at bedtime.     [provider]      Allergies    Patient has no known allergies.    Review of Systems   Review of Systems  All other systems reviewed and are negative.   Physical Exam Updated Vital Signs BP (!) 146/68   Pulse 92   Temp (!) 102.9 F (39.4 C) (Oral)   Resp (!) 25   Ht 5\' 6"  (1.676 m)   Wt 72.6 kg   SpO2 96%   BMI 25.82 kg/m  Physical Exam Vitals and nursing note reviewed.  Constitutional:      General: He is not in acute  distress.    Appearance: He is well-developed.  HENT:     Head: Normocephalic and atraumatic.  Eyes:     Conjunctiva/sclera: Conjunctivae normal.     Pupils: Pupils are equal, round, and reactive to light.  Cardiovascular:     Rate and Rhythm: Regular rhythm. Tachycardia present.     Heart sounds: Normal heart sounds.  Pulmonary:     Effort: Pulmonary effort is normal. No respiratory distress.     Breath sounds: Normal breath sounds.  Abdominal:     General: There is no distension.     Palpations: Abdomen is soft.     Tenderness: There is no abdominal tenderness.  Musculoskeletal:        General: No deformity. Normal range of motion.     Cervical back: Normal range of motion and neck supple.  Skin:    General: Skin is warm and dry.  Neurological:     General: No focal  deficit present.     Mental Status: He is alert and oriented to person, place, and time. Mental status is at baseline.     ED Results / Procedures / Treatments   Labs (all labs ordered are listed, but only abnormal results are displayed) Labs Reviewed  COMPREHENSIVE METABOLIC PANEL - Abnormal; Notable for the following components:      Result Value   CO2 21 (*)    Glucose, Bld 237 (*)    Creatinine, Ser 1.25 (*)    AST 65 (*)    GFR, Estimated 57 (*)    All other components within normal limits  LACTIC ACID, PLASMA - Abnormal; Notable for the following components:   Lactic Acid, Venous 2.1 (*)    All other components within normal limits  CBC WITH DIFFERENTIAL/PLATELET - Abnormal; Notable for the following components:   WBC 11.6 (*)    RBC 2.76 (*)    Hemoglobin 8.3 (*)    HCT 25.7 (*)    Neutro Abs 9.9 (*)    All other components within normal limits  URINALYSIS, W/ REFLEX TO CULTURE (INFECTION SUSPECTED) - Abnormal; Notable for the following components:   Glucose, UA 50 (*)    Hgb urine dipstick SMALL (*)    Ketones, ur 5 (*)    Protein, ur 100 (*)    All other components within normal limits  TROPONIN I (HIGH SENSITIVITY) - Abnormal; Notable for the following components:   Troponin I (High Sensitivity) 25 (*)    All other components within normal limits  RESP PANEL BY RT-PCR (RSV, FLU A&B, COVID)  RVPGX2  CULTURE, BLOOD (ROUTINE X 2)  CULTURE, BLOOD (ROUTINE X 2)  PROTIME-INR  LACTIC ACID, PLASMA  BRAIN NATRIURETIC PEPTIDE    EKG EKG Interpretation  Date/Time:  Monday Jul 24 2022 20:14:25 EDT Ventricular Rate:  97 PR Interval:  174 QRS Duration: 104 QT Interval:  370 QTC Calculation: 470 R Axis:   77 Text Interpretation: Sinus rhythm Confirmed by Kristine Royal [phone]) on 07/24/2022 8:16:50 PM  Radiology CT Head Wo Contrast  Result Date: 07/24/2022 CLINICAL DATA:  Mental status change, fall. EXAM: CT HEAD WITHOUT CONTRAST TECHNIQUE: Contiguous axial images  were obtained from the base of the skull through the vertex without intravenous contrast. RADIATION DOSE REDUCTION: This exam was performed according to the departmental dose-optimization program which includes automated exposure control, adjustment of the mA and/or kV according to patient size and/or use of iterative reconstruction technique. COMPARISON:  Head CT 06/16/2003.  MRI brain 04/12/2016. FINDINGS: Brain:  No evidence of acute infarction, hemorrhage, hydrocephalus, extra-axial collection or mass lesion/mass effect. 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 Mild diffuse atrophy is unchanged. Vascular: Atherosclerotic calcifications are present within the cavernous internal carotid arteries. Skull: Normal. Negative for fracture or focal lesion. Sinuses/Orbits: No acute finding. Other: None. IMPRESSION: 1. No acute intracranial abnormality. 2. 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 3. Mild diffuse atrophy is unchanged. Electronically Signed   By: Darliss Cheney M.D.   On: 07/24/2022 21:15   DG Chest 2 View  Result Date: 07/24/2022 CLINICAL DATA:  Suspected sepsis, fall. EXAM: CHEST - 2 VIEW COMPARISON:  12/05/2016. FINDINGS: Heart is enlarged and the mediastinal contour is within normal limits. 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 There is atherosclerotic calcification of the aorta. Lung volumes are low and mild interstitial prominence is noted bilaterally. No effusion or pneumothorax. Sternotomy wires and cervical spinal fusion hardware are noted. Degenerative changes are present in the thoracic spine. IMPRESSION: 1. Cardiomegaly. 2. Interstitial prominence bilaterally, which may be accentuated due to low lung volumes. Electronically Signed   By: Thornell Sartorius M.D.   On: 07/24/2022 20:43    Procedures Procedures    Medications Ordered in ED Medications  vancomycin (VANCOREADY) IVPB 1500 mg/300 mL (1,500 mg Intravenous New Bag/Given 07/24/22  2105)  ceFEPIme (MAXIPIME) 2 g in sodium chloride 0.9 % 100 mL IVPB (2 g Intravenous New Bag/Given 07/24/22 2104)  sodium chloride 0.9 % bolus 1,000 mL (has no administration in time range)  acetaminophen (TYLENOL) tablet 1,000 mg (1,000 mg Oral Given 07/24/22 2101)    ED Course/ Medical Decision Making/ A&P                             Medical Decision Making Amount and/or Complexity of Data Reviewed Labs: ordered. Radiology: ordered.  Risk OTC drugs. Prescription drug management.    Medical Screen Complete  This patient presented to the ED with complaint of fever, weakness, cough.  This complaint involves an extensive number of treatment options. The initial differential diagnosis includes, but is not limited to, bacterial versus viral infection, metabolic abnormality, sepsis, etc.  This presentation is: Acute, Self-Limited, Previously Undiagnosed, Uncertain Prognosis, Complicated, Systemic Symptoms, and Threat to Life/Bodily Function  Patient with approximately 48 hours of malaise and fatigue presents with complaint of weakness and fever.  Patient noted to be hypoxic on room air.    Initial temperature of 102.9.  Patient noted to be tachycardic.  Broad-spectrum antibiotics initiated on arrival for possible early sepsis.  History and exam concerning for possible pneumonia.  However, chest x-ray is without clear infiltrate.  Initial COVID, flu, RSV are also negative.  Patient would benefit from admission for further workup and treatment.  Patient's initial lactic acid is 2.1.  IV fluids initiated on arrival.  Hospitalist service is aware of case and will evaluate for admission.    Additional history obtained:  External records from outside sources obtained and reviewed including prior ED visits and prior Inpatient records.    Lab Tests:  I ordered and personally interpreted labs.  The pertinent results include: CBC, CMP, lactic acid, COVID, flu, UA,  INR   Imaging Studies ordered:  I ordered imaging studies including CT head, chest x-ray I independently visualized and interpreted obtained imaging which showed NAD I agree with the radiologist interpretation.   Cardiac Monitoring:  The patient was maintained on a cardiac monitor.  I personally viewed and interpreted the cardiac monitor which showed an underlying rhythm of: Sinus tach then NSR   Medicines ordered:  I ordered medication including IV fluids, antibiotics for suspected infection Reevaluation of the patient after these medicines showed that the patient: improved    Problem List / ED Course:  Fever, weakness, hypoxia   Reevaluation:  After the interventions noted above, I reevaluated the patient and found that they have: improved  Disposition:  After consideration of the diagnostic results and the patients response to treatment, I feel that the patent would benefit from admission.   CRITICAL CARE Performed by: Wynetta Fines   Total critical care time: 30 minutes  Critical care time was exclusive of separately billable procedures and treating other patients.  Critical care was necessary to treat or prevent imminent or life-threatening deterioration.  Critical care was time spent personally by me on the following activities: development of treatment plan with patient and/or surrogate as well as nursing, discussions with consultants, evaluation of patient's response to treatment, examination of patient, obtaining history from patient or surrogate, ordering and performing treatments and interventions, ordering and review of laboratory studies, ordering and review of radiographic studies, pulse oximetry and re-evaluation of patient's condition.          Final Clinical Impression(s) / ED Diagnoses Final diagnoses:  Fever, unspecified fever cause  Cough, unspecified type  Hypoxia    Rx / DC Orders ED Discharge Orders     None          Wynetta Fines, MD 07/24/22 2248

## 2022-07-24 NOTE — ED Triage Notes (Signed)
Patient arrives from home via EMS for a fall. Patient fell around 5pm today. Wife reports no LOC, denies hitting his head. No blood thinners. Wife also reports patient has had a "cold" and appears more confused today. Normally the patient is able to get himself up after falling but was unable to do so today. Patient arrives with HR 110 and Fever of 102.9.

## 2022-07-25 ENCOUNTER — Encounter (HOSPITAL_COMMUNITY): Payer: Self-pay | Admitting: Internal Medicine

## 2022-07-25 ENCOUNTER — Inpatient Hospital Stay (HOSPITAL_COMMUNITY): Payer: Medicare Other

## 2022-07-25 DIAGNOSIS — R531 Weakness: Secondary | ICD-10-CM

## 2022-07-25 DIAGNOSIS — J9601 Acute respiratory failure with hypoxia: Secondary | ICD-10-CM

## 2022-07-25 DIAGNOSIS — F32A Depression, unspecified: Secondary | ICD-10-CM | POA: Diagnosis present

## 2022-07-25 DIAGNOSIS — R7989 Other specified abnormal findings of blood chemistry: Secondary | ICD-10-CM | POA: Diagnosis not present

## 2022-07-25 DIAGNOSIS — E785 Hyperlipidemia, unspecified: Secondary | ICD-10-CM | POA: Diagnosis present

## 2022-07-25 DIAGNOSIS — A419 Sepsis, unspecified organism: Secondary | ICD-10-CM | POA: Diagnosis present

## 2022-07-25 DIAGNOSIS — D649 Anemia, unspecified: Secondary | ICD-10-CM | POA: Diagnosis present

## 2022-07-25 DIAGNOSIS — J209 Acute bronchitis, unspecified: Secondary | ICD-10-CM | POA: Diagnosis not present

## 2022-07-25 LAB — HEMOGLOBIN A1C
Hgb A1c MFr Bld: 7 % — ABNORMAL HIGH (ref 4.8–5.6)
Mean Plasma Glucose: 154.2 mg/dL

## 2022-07-25 LAB — CBC WITH DIFFERENTIAL/PLATELET
Abs Immature Granulocytes: 0.07 10*3/uL (ref 0.00–0.07)
Basophils Absolute: 0 10*3/uL (ref 0.0–0.1)
Basophils Relative: 0 %
Eosinophils Absolute: 0.3 10*3/uL (ref 0.0–0.5)
Eosinophils Relative: 3 %
HCT: 23.4 % — ABNORMAL LOW (ref 39.0–52.0)
Hemoglobin: 7.1 g/dL — ABNORMAL LOW (ref 13.0–17.0)
Immature Granulocytes: 1 %
Lymphocytes Relative: 9 %
Lymphs Abs: 0.9 10*3/uL (ref 0.7–4.0)
MCH: 29.2 pg (ref 26.0–34.0)
MCHC: 30.3 g/dL (ref 30.0–36.0)
MCV: 96.3 fL (ref 80.0–100.0)
Monocytes Absolute: 1.1 10*3/uL — ABNORMAL HIGH (ref 0.1–1.0)
Monocytes Relative: 11 %
Neutro Abs: 7.3 10*3/uL (ref 1.7–7.7)
Neutrophils Relative %: 76 %
Platelets: 208 10*3/uL (ref 150–400)
RBC: 2.43 MIL/uL — ABNORMAL LOW (ref 4.22–5.81)
RDW: 15.2 % (ref 11.5–15.5)
WBC: 9.7 10*3/uL (ref 4.0–10.5)
nRBC: 0 % (ref 0.0–0.2)

## 2022-07-25 LAB — ECHOCARDIOGRAM COMPLETE
AR max vel: 1.31 cm2
AV Area VTI: 1.56 cm2
AV Area mean vel: 1.33 cm2
AV Mean grad: 17 mmHg
AV Peak grad: 27.2 mmHg
Ao pk vel: 2.61 m/s
Area-P 1/2: 2.62 cm2
Calc EF: 58.2 %
Height: 66 in
MV VTI: 1.15 cm2
S' Lateral: 4 cm
Single Plane A2C EF: 61.1 %
Single Plane A4C EF: 55.4 %
Weight: 2709.01 oz

## 2022-07-25 LAB — GLUCOSE, CAPILLARY
Glucose-Capillary: 206 mg/dL — ABNORMAL HIGH (ref 70–99)
Glucose-Capillary: 189 mg/dL — ABNORMAL HIGH (ref 70–99)
Glucose-Capillary: 231 mg/dL — ABNORMAL HIGH (ref 70–99)
Glucose-Capillary: 206 mg/dL — ABNORMAL HIGH (ref 70–99)

## 2022-07-25 LAB — COMPREHENSIVE METABOLIC PANEL
ALT: 32 U/L (ref 0–44)
AST: 38 U/L (ref 15–41)
Albumin: 3.1 g/dL — ABNORMAL LOW (ref 3.5–5.0)
Alkaline Phosphatase: 51 U/L (ref 38–126)
Anion gap: 10 (ref 5–15)
BUN: 21 mg/dL (ref 8–23)
CO2: 19 mmol/L — ABNORMAL LOW (ref 22–32)
Calcium: 8.1 mg/dL — ABNORMAL LOW (ref 8.9–10.3)
Chloride: 106 mmol/L (ref 98–111)
Creatinine, Ser: 1.08 mg/dL (ref 0.61–1.24)
GFR, Estimated: >60 mL/min (ref >60)
Glucose, Bld: 214 mg/dL — ABNORMAL HIGH (ref 70–99)
Potassium: 4.1 mmol/L (ref 3.5–5.1)
Sodium: 135 mmol/L (ref 135–145)
Total Bilirubin: 0.7 mg/dL (ref 0.3–1.2)
Total Protein: 6.5 g/dL (ref 6.5–8.1)

## 2022-07-25 LAB — TROPONIN I (HIGH SENSITIVITY): Troponin I (High Sensitivity): 38 ng/L — ABNORMAL HIGH (ref <18)

## 2022-07-25 LAB — ABO/RH: ABO/RH(D): A POS

## 2022-07-25 LAB — PROCALCITONIN: Procalcitonin: 1.81 ng/mL

## 2022-07-25 LAB — BLOOD GAS, VENOUS
Acid-base deficit: 4.9 mmol/L — ABNORMAL HIGH (ref 0.0–2.0)
Bicarbonate: 20.6 mmol/L (ref 20.0–28.0)
O2 Saturation: 23.1 %
Patient temperature: 37.1
pCO2, Ven: 39 mmHg — ABNORMAL LOW (ref 44–60)
pH, Ven: 7.33 (ref 7.25–7.43)
pO2, Ven: <31 mmHg — CL (ref 32–45)

## 2022-07-25 LAB — TYPE AND SCREEN

## 2022-07-25 LAB — IRON AND TIBC
Iron: 16 ug/dL — ABNORMAL LOW (ref 45–182)
Saturation Ratios: 5 % — ABNORMAL LOW (ref 17.9–39.5)
TIBC: 354 ug/dL (ref 250–450)
UIBC: 338 ug/dL

## 2022-07-25 LAB — FERRITIN: Ferritin: 101 ng/mL (ref 24–336)

## 2022-07-25 LAB — RETICULOCYTES
Immature Retic Fract: 18.2 % — ABNORMAL HIGH (ref 2.3–15.9)
RBC.: 2.37 MIL/uL — ABNORMAL LOW (ref 4.22–5.81)
Retic Count, Absolute: 41.5 10*3/uL (ref 19.0–186.0)
Retic Ct Pct: 1.8 % (ref 0.4–3.1)

## 2022-07-25 LAB — VITAMIN B12: Vitamin B-12: 201 pg/mL (ref 180–914)

## 2022-07-25 LAB — TSH: TSH: 2.206 u[IU]/mL (ref 0.350–4.500)

## 2022-07-25 LAB — PHOSPHORUS: Phosphorus: 3.5 mg/dL (ref 2.5–4.6)

## 2022-07-25 LAB — MAGNESIUM: Magnesium: 1.8 mg/dL (ref 1.7–2.4)

## 2022-07-25 LAB — CULTURE, BLOOD (ROUTINE X 2)

## 2022-07-25 LAB — FOLATE: Folate: 26.9 ng/mL (ref >5.9)

## 2022-07-25 MED ORDER — VITAMIN B-12 1000 MCG PO TABS
1000.0000 ug | ORAL_TABLET | Freq: Every day | ORAL | Status: DC
  Administered 2022-07-25 – 2022-07-27 (×3): 1000 ug via ORAL
  Filled 2022-07-25 (×3): qty 1

## 2022-07-25 MED ORDER — IPRATROPIUM-ALBUTEROL 0.5-2.5 (3) MG/3ML IN SOLN
3.0000 mL | Freq: Three times a day (TID) | RESPIRATORY_TRACT | Status: DC
  Administered 2022-07-25 – 2022-07-26 (×4): 3 mL via RESPIRATORY_TRACT
  Filled 2022-07-25 (×4): qty 3

## 2022-07-25 MED ORDER — INSULIN ASPART 100 UNIT/ML IJ SOLN
0.0000 [IU] | Freq: Three times a day (TID) | INTRAMUSCULAR | Status: DC
  Administered 2022-07-25: 3 [IU] via SUBCUTANEOUS
  Administered 2022-07-25: 2 [IU] via SUBCUTANEOUS
  Administered 2022-07-25: 3 [IU] via SUBCUTANEOUS
  Administered 2022-07-26: 2 [IU] via SUBCUTANEOUS
  Administered 2022-07-26: 3 [IU] via SUBCUTANEOUS
  Administered 2022-07-26: 2 [IU] via SUBCUTANEOUS
  Administered 2022-07-27: 3 [IU] via SUBCUTANEOUS

## 2022-07-25 MED ORDER — SODIUM CHLORIDE 0.9 % IV SOLN
2.0000 g | Freq: Every day | INTRAVENOUS | Status: DC
  Administered 2022-07-25 – 2022-07-26 (×2): 2 g via INTRAVENOUS
  Filled 2022-07-25 (×5): qty 20

## 2022-07-25 MED ORDER — PANTOPRAZOLE SODIUM 40 MG PO TBEC
40.0000 mg | DELAYED_RELEASE_TABLET | Freq: Every day | ORAL | Status: DC
  Administered 2022-07-25 – 2022-07-26 (×2): 40 mg via ORAL
  Filled 2022-07-25 (×2): qty 1

## 2022-07-25 MED ORDER — SODIUM CHLORIDE 0.9 % IV SOLN
250.0000 mg | Freq: Every day | INTRAVENOUS | Status: AC
  Administered 2022-07-25 – 2022-07-26 (×2): 250 mg via INTRAVENOUS
  Filled 2022-07-25 (×2): qty 20

## 2022-07-25 MED ORDER — FUROSEMIDE 20 MG PO TABS
20.0000 mg | ORAL_TABLET | Freq: Every day | ORAL | Status: DC
  Administered 2022-07-25 – 2022-07-27 (×3): 20 mg via ORAL
  Filled 2022-07-25 (×3): qty 1

## 2022-07-25 MED ORDER — METOPROLOL SUCCINATE ER 50 MG PO TB24
50.0000 mg | ORAL_TABLET | Freq: Every day | ORAL | Status: DC
  Administered 2022-07-25 – 2022-07-27 (×3): 50 mg via ORAL
  Filled 2022-07-25 (×3): qty 1

## 2022-07-25 MED ORDER — OXYCODONE HCL 5 MG PO TABS: 5.0000 mg | ORAL_TABLET | Freq: Four times a day (QID) | ORAL | Status: DC | PRN

## 2022-07-25 MED ORDER — SPIRONOLACTONE 25 MG PO TABS
25.0000 mg | ORAL_TABLET | Freq: Every day | ORAL | Status: DC
  Administered 2022-07-25 – 2022-07-27 (×3): 25 mg via ORAL
  Filled 2022-07-25 (×3): qty 1

## 2022-07-25 MED ORDER — ROSUVASTATIN CALCIUM 10 MG PO TABS
10.0000 mg | ORAL_TABLET | Freq: Every day | ORAL | Status: DC
  Administered 2022-07-25 – 2022-07-26 (×2): 10 mg via ORAL
  Filled 2022-07-25 (×3): qty 1

## 2022-07-25 MED ORDER — FLUOXETINE HCL 20 MG PO CAPS
20.0000 mg | ORAL_CAPSULE | Freq: Every day | ORAL | Status: DC
  Administered 2022-07-25 – 2022-07-26 (×2): 20 mg via ORAL
  Filled 2022-07-25 (×2): qty 1

## 2022-07-25 MED ORDER — FERROUS SULFATE 325 (65 FE) MG PO TABS
325.0000 mg | ORAL_TABLET | Freq: Every day | ORAL | Status: DC
  Administered 2022-07-27: 325 mg via ORAL
  Filled 2022-07-25 (×2): qty 1

## 2022-07-25 MED ORDER — BENZONATATE 100 MG PO CAPS: 200.0000 mg | ORAL_CAPSULE | Freq: Three times a day (TID) | ORAL | Status: DC | PRN

## 2022-07-25 MED ORDER — FUROSEMIDE 20 MG PO TABS: 20.0000 mg | ORAL_TABLET | Freq: Every day | ORAL | Status: DC | PRN

## 2022-07-25 NOTE — Evaluation (Signed)
Occupational Therapy Evaluation Patient Details Name: [name] MRN: [id number] DOB: 22-Oct-1935 Today's Date: 07/25/2022   History of Present Illness Patient is a 87 year old male who presented on 5/6 with generalized weakness and fall in ED pt was found to be tin tachycardia, hypoxic with elevated tempeture. Patient was admitted with sepsis secondary to acute bronchitis. Pt head CT negative for acute findings and HgB of 7.1. PMH including but not limited to:  DM II, depression, L shoulder surgery, prostate cancer, spinal stenosis, stroke, a flutter abliation, cervical disc surgery.   Clinical Impression   Patient is a 87 year old male who was admitted for above. Patient was independent at home lives with wife. Patient was min guard for transfers with cues for proper positioning with RW. Patient endorsed having multiple falls from unknown reasoning at home.  Patient endorsed pain in upper trap area but no impact on movements during session. Patient plans to d/c back home with wife at time of d/c. Patient was noted to have decreased functional activity tolerance, decreased endurance, decreased standing balance, decreased safety awareness, and decreased knowledge of AD/AE impacting participation in ADLs. Patient would continue to benefit from skilled OT services at this time while admitted and after d/c to address noted deficits in order to improve overall safety and independence in ADLs.       Recommendations for follow up therapy are one component of a multi-disciplinary discharge planning process, led by the attending physician.  Recommendations may be updated based on patient status, additional functional criteria and insurance authorization.   Assistance Recommended at Discharge PRN  Patient can return home with the following Assistance with cooking/housework    Functional Status Assessment  Patient has had a recent decline in their functional status and demonstrates the ability to make  significant improvements in function in a reasonable and predictable amount of time.  Equipment Recommendations  None recommended by OT       Precautions / Restrictions Precautions Precautions: Fall Restrictions Weight Bearing Restrictions: No      Mobility Bed Mobility Overal bed mobility: Needs Assistance Bed Mobility: Supine to Sit     Supine to sit: Supervision, HOB elevated     General bed mobility comments: min cues    Transfers Overall transfer level: Needs assistance Equipment used: Rolling walker (2 wheels) Transfers: Sit to/from Stand Sit to Stand: Min guard           General transfer comment: cues for proper UE and AD placement      Balance Overall balance assessment: History of Falls, Needs assistance Sitting-balance support: Feet supported Sitting balance-Leahy Scale: Good     Standing balance support: Bilateral upper extremity supported, During functional activity, Reliant on assistive device for balance Standing balance-Leahy Scale: Poor             ADL either performed or assessed with clinical judgement   ADL Overall ADL's : Needs assistance/impaired Eating/Feeding: Modified independent;Sitting   Grooming: Oral care;Sitting;Min guard;Wash/dry face Grooming Details (indicate cue type and reason): patient noted to have spillage of toothpaste from mouth with minimal awareness. Upper Body Bathing: Set up;Sitting   Lower Body Bathing: Minimal assistance;Sitting/lateral leans;Sit to/from stand   Upper Body Dressing : Min guard;Sitting   Lower Body Dressing: Minimal assistance;Sitting/lateral leans;Sit to/from stand   Toilet Transfer: Minimal assistance;Ambulation;Rolling walker (2 wheels) Toilet Transfer Details (indicate cue type and reason): patient was noted to push walker out in front of him with cues to keep it closer. patient  simulated toilet transfer but needed cues on proper way to approach it. Toileting- Clothing Manipulation and  Hygiene: Minimal assistance;Sit to/from stand               Vision   Additional Comments: patient reported that he has blurred vision with glasses in place. patient reported having a new prescription that he has not picked up yet. "slight blurriness" is how patient described it.            Pertinent Vitals/Pain Pain Assessment Pain Assessment: 0-10 Pain Score: 5  Pain Location: head and neck Pain Descriptors / Indicators: Aching, Constant, Grimacing, Headache Pain Intervention(s): Limited activity within patient's tolerance, Monitored during session     Hand Dominance     Extremity/Trunk Assessment Upper Extremity Assessment Upper Extremity Assessment: Overall WFL for tasks assessed   Lower Extremity Assessment Lower Extremity Assessment: Defer to PT evaluation RLE Sensation: decreased light touch   Cervical / Trunk Assessment Cervical / Trunk Assessment:  (wfl)      Cognition Arousal/Alertness: Awake/alert Behavior During Therapy: WFL for tasks assessed/performed Overall Cognitive Status: Within Functional Limits for tasks assessed       General Comments  pt reports his O2 should not go below 95% and expressed concern            Home Living Family/patient expects to be discharged to:: Private residence Living Arrangements: Spouse/significant other Available Help at Discharge: Family Type of Home: House Home Access: Stairs to enter Secretary/administrator of Steps: 3 Entrance Stairs-Rails: Right;Left Home Layout: Multi-level;Able to live on main level with bedroom/bathroom     Bathroom Shower/Tub: Walk-in shower     Bathroom Accessibility: Yes   Home Equipment: Agricultural consultant (2 wheels);Cane - single point;Grab bars - tub/shower   Additional Comments: working part time as Doctor, general practice Prior Level of Function : Independent/Modified Independent;Driving             Mobility Comments: IND with all ADLs, self care  tasks, IADLs, driving          OT Problem List: Decreased activity tolerance;Impaired balance (sitting and/or standing);Decreased coordination;Decreased safety awareness;Decreased knowledge of precautions      OT Treatment/Interventions: Self-care/ADL training;Energy conservation;Therapeutic exercise;DME and/or AE instruction;Therapeutic activities;Patient/family education;Balance training    OT Goals(Current goals can be found in the care plan section) Acute Rehab OT Goals Patient Stated Goal: to go home OT Goal Formulation: With patient/family Time For Goal Achievement: 08/08/22 Potential to Achieve Goals: Fair  OT Frequency: Min 2X/week    Co-evaluation PT/OT/SLP Co-Evaluation/Treatment: Yes Reason for Co-Treatment: For patient/therapist safety;Complexity of the patient's impairments (multi-system involvement) PT goals addressed during session: Mobility/safety with mobility;Balance OT goals addressed during session: ADL's and self-care      AM-PAC OT "6 Clicks" Daily Activity     Outcome Measure Help from another person eating meals?: None Help from another person taking care of personal grooming?: A Little Help from another person toileting, which includes using toliet, bedpan, or urinal?: A Little Help from another person bathing (including washing, rinsing, drying)?: A Little Help from another person to put on and taking off regular upper body clothing?: A Little Help from another person to put on and taking off regular lower body clothing?: A Little 6 Click Score: 19   End of Session Equipment Utilized During Treatment: Gait belt;Rolling walker (2 wheels) Nurse Communication: Other (comment) (ok to participate in session)  Activity Tolerance: Patient tolerated treatment well Patient left: in chair;with call bell/phone  within reach;with chair alarm set;with family/visitor present  OT Visit Diagnosis: Unsteadiness on feet (R26.81);Other abnormalities of gait and mobility  (R26.89);Muscle weakness (generalized) (M62.81)                Time: 1610-9604 OT Time Calculation (min): 33 min Charges:  OT General Charges $OT Visit: 1 Visit OT Evaluation $OT Eval Moderate Complexity: 1 Mod  Gurneet Matarese OTR/L, MS Acute Rehabilitation Department Office# [phone]   Selinda Flavin 07/25/2022, 1:06 PM

## 2022-07-25 NOTE — Progress Notes (Signed)
Chaplain engaged in an initial visit with [name]. [name] would like to assign his wife as his healthcare agent. Because they are legally married, she has the ability to make medical decisions if he loses the capacity to do so for himself. [name] did not want to add anyone else to paperwork.   Chaplain offered supportive presence and education. When Chaplain checked in with [name], he noted he is having "lots of little pains." Nurse is aware.   Chaplain Kashvi Prevette, MDiv  07/25/22 1200  Spiritual Encounters  Type of Visit Initial  Care provided to: Patient  Reason for visit Advance directives  Spiritual Framework  Presenting Themes Goals in life/care  Community/Connection Significant other  Interventions  Spiritual Care Interventions Made Decision-making support/facilitation;Established relationship of care and support

## 2022-07-25 NOTE — Inpatient Diabetes Management (Signed)
Inpatient Diabetes Program Recommendations  AACE/ADA: New Consensus Statement on Inpatient Glycemic Control (2015)  Target Ranges:  Prepandial:   less than 140 mg/dL      Peak postprandial:   less than 180 mg/dL (1-2 hours)      Critically ill patients:  140 - 180 mg/dL   Lab Results  Component Value Date   GLUCAP 189 (H) 07/25/2022   HGBA1C 7.0 (H) 07/25/2022    Review of Glycemic Control  Latest Reference Range & Units 07/25/22 07:30 07/25/22 12:00  Glucose-Capillary 70 - 99 mg/dL 161 (H) 096 (H)  (H): Data is abnormally high Diabetes history: Type 2 DM Outpatient Diabetes medications: Humalog 75/25 18 units BID, Metformin 1000 mg BID Current orders for Inpatient glycemic control:Novolog 0-9 units TID  Inpatient Diabetes Program Recommendations:    Consider adding: -Semglee 8 units Qd -changing diet to carb modified   Thanks, Lujean Rave, MSN, RNC-OB Diabetes Coordinator [phone] (8a-5p)

## 2022-07-25 NOTE — Progress Notes (Addendum)
PROGRESS NOTE  [name]  ZOX:[id number] DOB: [birth year] DOA: 07/24/2022 PCP: Pcp, No   Brief Narrative: Patient is a 87 year old male with history of coronary artery  disease status post CABG, hypertension, hyperlipidemia, type 2 diabetes, anemia of chronic disease with a baseline hemoglobin in the range of 9-10 who presented to the emergency department with complaint of generalized weakness, nonproductive cough, mild shortness of breath.  On presentation, he was febrile, blood pressure stable, saturating 89% on room air and was put on supplemental oxygen at 2 L/min.  Lab work  showed creatinine of 1.2, BNP of 282, mildly elevated troponin with flat trend, white cell count of 11.6, hemoglobin of 8.3.  Blood cultures was collected in the ED.  Chest x-ray showed bilateral interstitial prominence, cardiomegaly.  Patient was suspected to have acute bronchitis and was admitted, started on antibiotics  Assessment & Plan:  Principal Problem:   Acute bronchitis Active Problems:   Essential hypertension   DM2 (diabetes mellitus, type 2) (HCC)   Severe sepsis (HCC)   Generalized weakness   Acute hypoxic respiratory failure (HCC)   Elevated troponin   Acute on chronic anemia   Hyperlipidemia   Depression   Severe sepsis secondary to bronchitis: Presented  with 2 days history of cough, shortness of breath, fever.  CXR did not show  any clear pneumonia.  Procalcitonin elevated at 1.8 COVID/flu/influenza negative.  Elevated lactate level, leukocytes on presentation which have resolved.    Given a dose of vancomycin and cefepime in the emergency room.  Currently on azithromycin, ceftriaxone.  Continue bronchodilators  Acute hypoxic respiratory failure: Cud be from  pneumonia versus bronchitis.  Chest x-ray did not show any clear pneumonia.  COVID/flu/RSV negative.  Try to monitor on room air.   Elevated BNP/troponin: Denies any chest pain.  Troponin is elevated with flat trend likely from demand  ischemia.  Echo showed EF of 55 to 60%, no wall motion abnormality, left ventricular diastolic function could not be evaluated.  Diabetes management, as needed Lasix at home  Acute on chronic normocytic anemia: Baseline hemoglobin ranges from 9-11.  Hemoglobin of 7.1 this morning.  No report of hematochezia or melena.  Will check FOBT.  Iron panel showed severe iron deficiency.  Given  IV iron.  Relatively low vitamin B12 of 201.  Will supplement.  Folic acid level normal.  Hypertension: Takes losartan, metoprolol at home.  Currently blood stable.  Home metoprolol resumed  Hyperlipidemia: On Crestor  Type 2 diabetes: Takes insulin, metformin at home.  A1c of 7 Continue sliding-scale insulin  Depression: On Prozac.  Generalized weakness/deconditioning: 2 days history of onset of weakness, no focal neurological deficits.  PT/OT consulted.  Ambulates with the help of walker at home, has fallen off on recently.  CT head did not show any acute findings.         DVT prophylaxis:SCDs Start: 07/24/22 2209     Code Status: Full Code  Family Communication: Wife at bedside  Patient status:Inpatient  Patient is from :Home  Anticipated discharge WJ:XBJY  Estimated DC date:1-2 days   Consultants: None  Procedures:None  Antimicrobials:  Anti-infectives (From admission, onward)    Start     Dose/Rate Route Frequency Ordered Stop   07/25/22 0800  cefTRIAXone (ROCEPHIN) 1 g in sodium chloride 0.9 % 100 mL IVPB        1 g 200 mL/hr over 30 Minutes Intravenous Every 24 hours 07/24/22 2223     07/24/22 2300  azithromycin (ZITHROMAX)  500 mg in sodium chloride 0.9 % 250 mL IVPB        500 mg 250 mL/hr over 60 Minutes Intravenous Every 24 hours 07/24/22 2223     07/24/22 2030  vancomycin (VANCOREADY) IVPB 1500 mg/300 mL        1,500 mg 150 mL/hr over 120 Minutes Intravenous  Once 07/24/22 2028 07/24/22 2305   07/24/22 2030  ceFEPIme (MAXIPIME) 2 g in sodium chloride 0.9 % 100 mL IVPB         2 g 200 mL/hr over 30 Minutes Intravenous  Once 07/24/22 2028 07/24/22 2150       Subjective: Patient seen and examined at bedside today.  Hemodynamically stable.  On 2 L of oxygen per minute.  Denies any worsening shortness of breath or cough or chest pai.  Complains of some headache, neck pain. appears weak, deconditioned.  Wife at bedside.  Objective: Vitals:   07/25/22 0000 07/25/22 0243 07/25/22 0400 07/25/22 0500  BP: (!) 114/57  118/68   Pulse: 77  77   Resp: 16  16   Temp: (!) 97.4 F (36.3 C)  98 F (36.7 C)   TempSrc: Oral  Oral   SpO2: 96% 95%    Weight:    76.8 kg  Height:        Intake/Output Summary (Last 24 hours) at 07/25/2022 0805 Last data filed at 07/25/2022 0500 Gross per 24 hour  Intake 846.79 ml  Output --  Net 846.79 ml   Filed Weights   07/24/22 2008 07/25/22 0500  Weight: 72.6 kg 76.8 kg    Examination:  General exam: Weak, deconditioned, not in distress HEENT: PERRL Respiratory system: Diminished bilaterally, few crackles in the right base Cardiovascular system: S1 & S2 heard, RRR.  Gastrointestinal system: Abdomen is nondistended, soft and nontender. Central nervous system: Alert and oriented Extremities: No edema, no clubbing ,no cyanosis Skin: No rashes, no ulcers,no icterus     Data Reviewed: I have personally reviewed following labs and imaging studies  CBC: Recent Labs  Lab 07/24/22 2016 07/25/22 0609  WBC 11.6* 9.7  NEUTROABS 9.9* 7.3  HGB 8.3* 7.1*  HCT 25.7* 23.4*  MCV 93.1 96.3  PLT 245 208   Basic Metabolic Panel: Recent Labs  Lab 07/24/22 2016 07/25/22 0609  NA 135 135  K 4.6 4.1  CL 103 106  CO2 21* 19*  GLUCOSE 237* 214*  BUN 23 21  CREATININE 1.25* 1.08  CALCIUM 8.9 8.1*  MG 2.2 1.8  PHOS  --  3.5     Recent Results (from the past 240 hour(s))  Resp panel by RT-PCR (RSV, Flu A&B, Covid) Anterior Nasal Swab     Status: None   Collection Time: 07/24/22  8:16 PM   Specimen: Anterior Nasal Swab   Result Value Ref Range Status   SARS Coronavirus 2 by RT PCR NEGATIVE NEGATIVE Final    Comment: (NOTE) SARS-CoV-2 target nucleic acids are NOT DETECTED.  The SARS-CoV-2 RNA is generally detectable in upper respiratory specimens during the acute phase of infection. The lowest concentration of SARS-CoV-2 viral copies this assay can detect is 138 copies/mL. A negative result does not preclude SARS-Cov-2 infection and should not be used as the sole basis for treatment or other patient management decisions. A negative result may occur with  improper specimen collection/handling, submission of specimen other than nasopharyngeal swab, presence of viral mutation(s) within the areas targeted by this assay, and inadequate number of viral copies(<138 copies/mL). A negative  result must be combined with clinical observations, patient history, and epidemiological information. The expected result is Negative.  Fact Sheet for Patients:  BloggerCourse.com  Fact Sheet for Healthcare Providers:  SeriousBroker.it  This test is no t yet approved or cleared by the [place] FDA and  has been authorized for detection and/or diagnosis of SARS-CoV-2 by FDA under an Emergency Use Authorization (EUA). This EUA will remain  in effect (meaning this test can be used) for the duration of the COVID-19 declaration under Section 564(b)(1) of the Act, 21 U.S.C.section 360bbb-3(b)(1), unless the authorization is terminated  or revoked sooner.       Influenza A by PCR NEGATIVE NEGATIVE Final   Influenza B by PCR NEGATIVE NEGATIVE Final    Comment: (NOTE) The Xpert Xpress SARS-CoV-2/FLU/RSV plus assay is intended as an aid in the diagnosis of influenza from Nasopharyngeal swab specimens and should not be used as a sole basis for treatment. Nasal washings and aspirates are unacceptable for Xpert Xpress SARS-CoV-2/FLU/RSV testing.  Fact Sheet for  Patients: BloggerCourse.com  Fact Sheet for Healthcare Providers: SeriousBroker.it  This test is not yet approved or cleared by the [place] FDA and has been authorized for detection and/or diagnosis of SARS-CoV-2 by FDA under an Emergency Use Authorization (EUA). This EUA will remain in effect (meaning this test can be used) for the duration of the COVID-19 declaration under Section 564(b)(1) of the Act, 21 U.S.C. section 360bbb-3(b)(1), unless the authorization is terminated or revoked.     Resp Syncytial Virus by PCR NEGATIVE NEGATIVE Final    Comment: (NOTE) Fact Sheet for Patients: BloggerCourse.com  Fact Sheet for Healthcare Providers: SeriousBroker.it  This test is not yet approved or cleared by the [place] FDA and has been authorized for detection and/or diagnosis of SARS-CoV-2 by FDA under an Emergency Use Authorization (EUA). This EUA will remain in effect (meaning this test can be used) for the duration of the COVID-19 declaration under Section 564(b)(1) of the Act, 21 U.S.C. section 360bbb-3(b)(1), unless the authorization is terminated or revoked.  Performed at [facility], [address], [place], [place] [address]      Radiology Studies: CT Head Wo Contrast  Result Date: 07/24/2022 CLINICAL DATA:  Mental status change, fall. EXAM: CT HEAD WITHOUT CONTRAST TECHNIQUE: Contiguous axial images were obtained from the base of the skull through the vertex without intravenous contrast. RADIATION DOSE REDUCTION: This exam was performed according to the departmental dose-optimization program which includes automated exposure control, adjustment of the mA and/or kV according to patient size and/or use of iterative reconstruction technique. COMPARISON:  Head CT 06/16/2003.  MRI brain 04/12/2016. FINDINGS: Brain: No evidence of acute infarction,  hemorrhage, hydrocephalus, extra-axial collection or mass lesion/mass effect. 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 Mild diffuse atrophy is unchanged. Vascular: Atherosclerotic calcifications are present within the cavernous internal carotid arteries. Skull: Normal. Negative for fracture or focal lesion. Sinuses/Orbits: No acute finding. Other: None. IMPRESSION: 1. No acute intracranial abnormality. 2. 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 3. Mild diffuse atrophy is unchanged. Electronically Signed   By: Darliss Cheney M.D.   On: 07/24/2022 21:15   DG Chest 2 View  Result Date: 07/24/2022 CLINICAL DATA:  Suspected sepsis, fall. EXAM: CHEST - 2 VIEW COMPARISON:  12/05/2016. FINDINGS: Heart is enlarged and the mediastinal contour is within normal limits. 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 There is atherosclerotic calcification of the aorta. Lung volumes are  low and mild interstitial prominence is noted bilaterally. No effusion or pneumothorax. Sternotomy wires and cervical spinal fusion hardware are noted. Degenerative changes are present in the thoracic spine. IMPRESSION: 1. Cardiomegaly. 2. Interstitial prominence bilaterally, which may be accentuated due to low lung volumes. Electronically Signed   By: Thornell Sartorius M.D.   On: 07/24/2022 20:43    Scheduled Meds:  FLUoxetine  20 mg Oral QHS   insulin aspart  0-9 Units Subcutaneous TID WC   ipratropium-albuterol  3 mL Nebulization TID   metoprolol succinate  50 mg Oral Daily   pantoprazole  40 mg Oral Daily   rosuvastatin  10 mg Oral Daily   Continuous Infusions:  azithromycin 500 mg (07/24/22 2254)   cefTRIAXone (ROCEPHIN)  IV     lactated ringers 50 mL/hr at 07/25/22 0051     LOS: 1 day   Burnadette Pop, MD [facility] P5/09/2022, 8:05 AM

## 2022-07-25 NOTE — Progress Notes (Signed)
Fecal occult test to be done with patient's next bowel movement. He has not been able to provide a sample yet this morning.

## 2022-07-25 NOTE — Evaluation (Signed)
Physical Therapy Evaluation Patient Details Name: [name] MRN: [id number] DOB: 04-07-1935 Today's Date: 07/25/2022  History of Present Illness  Patient is a 87 year old male who presented on 5/6 with generalized weakness and fall in ED pt was found to be tin tachycardia, hypoxic with elevated tempeture. Patient was admitted with sepsis secondary to acute bronchitis. Pt head CT negative for acute findings and HgB of 7.1. PMH including but not limited to:  DM II, depression, L shoulder surgery, prostate cancer, spinal stenosis, stroke, a flutter abliation, cervical disc surgery.  Clinical Impression   Pt admitted with above diagnosis.  Pt currently with functional limitations due to the deficits listed below (see PT Problem List). Pt in bed when therapist arrived, wife and nurse present, pt agreeable to therapy intervention,. Pt required increased time and S with cues for supine to sit, S and cues for functional transfer tasks at RW level including recliner, bed and commode. Gait tasks with RW, min guard and cues with trunk flexion and noted S and S of SOB with cues for pursed lip breathing for  150 feet with pt declining to take therapeutic rest break. O2 saturation decreased to 89% on RA and quickly recovered to 90%  once seated. Pt left seated in recliner, all needs in place and wife present. No indication of increased pain t/o intervention and pt denies dizziness. Pt has significant hx of falls with > 6 in the past 5 months. Pt will benefit from acute skilled PT to increase their independence and safety with mobility to allow discharge.      Recommendations for follow up therapy are one component of a multi-disciplinary discharge planning process, led by the attending physician.  Recommendations may be updated based on patient status, additional functional criteria and insurance authorization.  Follow Up Recommendations       Assistance Recommended at Discharge Intermittent  Supervision/Assistance  Patient can return home with the following  A little help with walking and/or transfers;A little help with bathing/dressing/bathroom;Assistance with cooking/housework;Assist for transportation;Help with stairs or ramp for entrance    Equipment Recommendations None recommended by PT (pt reports DME in home setting, wife is inquiring per use of rollator)  Recommendations for Other Services       Functional Status Assessment Patient has had a recent decline in their functional status and demonstrates the ability to make significant improvements in function in a reasonable and predictable amount of time.     Precautions / Restrictions Precautions Precautions: Fall Restrictions Weight Bearing Restrictions: No      Mobility  Bed Mobility Overal bed mobility: Needs Assistance Bed Mobility: Supine to Sit     Supine to sit: Supervision, HOB elevated     General bed mobility comments: min cues    Transfers Overall transfer level: Needs assistance Equipment used: Rolling walker (2 wheels) Transfers: Sit to/from Stand Sit to Stand: Min guard           General transfer comment: cues for proper UE and AD placement    Ambulation/Gait Ambulation/Gait assistance: Min guard Gait Distance (Feet): 150 Feet Assistive device: Rolling walker (2 wheels) Gait Pattern/deviations: Step-through pattern, Trunk flexed Gait velocity: decreased     General Gait Details: cues for posture and proper distnace from Rw  Stairs            Wheelchair Mobility    Modified Rankin (Stroke Patients Only)       Balance Overall balance assessment: History of Falls, Needs assistance Sitting-balance support:  Feet supported Sitting balance-Leahy Scale: Good     Standing balance support: Bilateral upper extremity supported, During functional activity, Reliant on assistive device for balance Standing balance-Leahy Scale: Poor                                Pertinent Vitals/Pain Pain Assessment Pain Assessment: 0-10 Pain Score: 5  Pain Location: head and neck Pain Descriptors / Indicators: Aching, Constant, Grimacing Pain Intervention(s): Limited activity within patient's tolerance, Monitored during session    Home Living Family/patient expects to be discharged to:: Private residence Living Arrangements: Spouse/significant other Available Help at Discharge: Family Type of Home: House Home Access: Stairs to enter Entrance Stairs-Rails: Doctor, general practice of Steps: 3 (one additional step to enter home without handrail)   Home Layout: Multi-level;Able to live on main level with bedroom/bathroom Home Equipment: Rolling Walker (2 wheels);Cane - single point;Grab bars - tub/shower 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 Prior Level of Function : Independent/Modified Independent;Driving             Mobility Comments: IND with all ADLs, self care tasks, IADLs, driving       Hand Dominance        Extremity/Trunk Assessment        Lower Extremity Assessment Lower Extremity Assessment: Generalized weakness;RLE deficits/detail RLE Sensation: decreased light touch    Cervical / Trunk Assessment Cervical / Trunk Assessment:  (wfl)  Communication      Cognition Arousal/Alertness: Awake/alert Behavior During Therapy: WFL for tasks assessed/performed Overall Cognitive Status: Within Functional Limits for tasks assessed                                          General Comments General comments (skin integrity, edema, etc.): pt reports his O2 should not go below 95% and expressed concern    Exercises     Assessment/Plan    PT Assessment Patient needs continued PT services  PT Problem List Decreased strength;Decreased activity tolerance;Decreased balance;Decreased mobility;Decreased coordination;Pain       PT Treatment Interventions DME instruction;Gait  training;Stair training;Functional mobility training;Therapeutic activities;Therapeutic exercise;Balance training;Neuromuscular re-education;Patient/family education    PT Goals (Current goals can be found in the Care Plan section)  Acute Rehab PT Goals Patient Stated Goal: decrease falls, retire PT Goal Formulation: With patient/family Time For Goal Achievement: 08/08/22 Potential to Achieve Goals: Good    Frequency Min 1X/week     Co-evaluation PT/OT/SLP Co-Evaluation/Treatment: Yes Reason for Co-Treatment: For patient/therapist safety;Complexity of the patient's impairments (multi-system involvement) PT goals addressed during session: Mobility/safety with mobility;Balance OT goals addressed during session: ADL's and self-care       AM-PAC PT "6 Clicks" Mobility  Outcome Measure Help needed turning from your back to your side while in a flat bed without using bedrails?: None Help needed moving from lying on your back to sitting on the side of a flat bed without using bedrails?: A Little Help needed moving to and from a bed to a chair (including a wheelchair)?: A Little Help needed standing up from a chair using your arms (e.g., wheelchair or bedside chair)?: A Little Help needed to walk in hospital room?: A Little Help needed climbing 3-5 steps with a railing? : A Lot 6 Click Score: 18    End of Session Equipment Utilized During Treatment:  Gait belt       PT Visit Diagnosis: Unsteadiness on feet (R26.81);Other abnormalities of gait and mobility (R26.89);Repeated falls (R29.6);Muscle weakness (generalized) (M62.81);Pain Pain - part of body:  (neck and head secondary to fall)    Time: 1610-9604 PT Time Calculation (min) (ACUTE ONLY): 33 min   Charges:   PT Evaluation $PT Eval Low Complexity: 1 Low          Rica Mote, PT   Jacqualyn Posey 07/25/2022, 12:08 PM

## 2022-07-26 ENCOUNTER — Encounter (HOSPITAL_COMMUNITY): Payer: Self-pay | Admitting: Internal Medicine

## 2022-07-26 DIAGNOSIS — J209 Acute bronchitis, unspecified: Secondary | ICD-10-CM | POA: Diagnosis not present

## 2022-07-26 LAB — TYPE AND SCREEN: Antibody Screen: NEGATIVE

## 2022-07-26 LAB — CBC
HCT: 21.1 % — ABNORMAL LOW (ref 39.0–52.0)
Hemoglobin: 6.8 g/dL — CL (ref 13.0–17.0)
MCH: 30.4 pg (ref 26.0–34.0)
MCHC: 32.2 g/dL (ref 30.0–36.0)
MCV: 94.2 fL (ref 80.0–100.0)
Platelets: 218 10*3/uL (ref 150–400)
RBC: 2.24 MIL/uL — ABNORMAL LOW (ref 4.22–5.81)
RDW: 15.3 % (ref 11.5–15.5)
WBC: 9.9 10*3/uL (ref 4.0–10.5)
nRBC: 0 % (ref 0.0–0.2)

## 2022-07-26 LAB — BASIC METABOLIC PANEL
Anion gap: 10 (ref 5–15)
BUN: 17 mg/dL (ref 8–23)
CO2: 20 mmol/L — ABNORMAL LOW (ref 22–32)
Calcium: 8.3 mg/dL — ABNORMAL LOW (ref 8.9–10.3)
Chloride: 103 mmol/L (ref 98–111)
Creatinine, Ser: 1.12 mg/dL (ref 0.61–1.24)
GFR, Estimated: >60 mL/min (ref >60)
Glucose, Bld: 200 mg/dL — ABNORMAL HIGH (ref 70–99)
Potassium: 3.5 mmol/L (ref 3.5–5.1)
Sodium: 133 mmol/L — ABNORMAL LOW (ref 135–145)

## 2022-07-26 LAB — CULTURE, BLOOD (ROUTINE X 2): Culture: NO GROWTH

## 2022-07-26 LAB — GLUCOSE, CAPILLARY
Glucose-Capillary: 190 mg/dL — ABNORMAL HIGH (ref 70–99)
Glucose-Capillary: 213 mg/dL — ABNORMAL HIGH (ref 70–99)
Glucose-Capillary: 182 mg/dL — ABNORMAL HIGH (ref 70–99)
Glucose-Capillary: 245 mg/dL — ABNORMAL HIGH (ref 70–99)

## 2022-07-26 LAB — BPAM RBC: Unit Type and Rh: 600

## 2022-07-26 LAB — PREPARE RBC (CROSSMATCH)

## 2022-07-26 LAB — OCCULT BLOOD X 1 CARD TO LAB, STOOL: Fecal Occult Bld: POSITIVE — AB

## 2022-07-26 MED ORDER — SODIUM CHLORIDE 0.9 % IV SOLN: INTRAVENOUS | Status: DC

## 2022-07-26 MED ORDER — POLYETHYLENE GLYCOL 3350 17 G PO PACK
17.0000 g | PACK | Freq: Every day | ORAL | Status: DC
  Administered 2022-07-26: 17 g via ORAL
  Filled 2022-07-26 (×2): qty 1

## 2022-07-26 MED ORDER — PANTOPRAZOLE SODIUM 40 MG IV SOLR
40.0000 mg | Freq: Two times a day (BID) | INTRAVENOUS | Status: DC
  Administered 2022-07-26: 40 mg via INTRAVENOUS
  Filled 2022-07-26 (×2): qty 10

## 2022-07-26 MED ORDER — IPRATROPIUM-ALBUTEROL 0.5-2.5 (3) MG/3ML IN SOLN
3.0000 mL | Freq: Two times a day (BID) | RESPIRATORY_TRACT | Status: DC
  Administered 2022-07-26 – 2022-07-27 (×2): 3 mL via RESPIRATORY_TRACT
  Filled 2022-07-26 (×2): qty 3

## 2022-07-26 MED ORDER — SODIUM CHLORIDE 0.9% IV SOLUTION: Freq: Once | INTRAVENOUS | Status: AC

## 2022-07-26 MED ORDER — ZOLPIDEM TARTRATE 5 MG PO TABS
5.0000 mg | ORAL_TABLET | Freq: Every evening | ORAL | Status: DC | PRN
  Administered 2022-07-26: 5 mg via ORAL
  Filled 2022-07-26: qty 1

## 2022-07-26 MED ORDER — SENNOSIDES-DOCUSATE SODIUM 8.6-50 MG PO TABS
1.0000 | ORAL_TABLET | Freq: Two times a day (BID) | ORAL | Status: DC
  Administered 2022-07-26 – 2022-07-27 (×3): 1 via ORAL
  Filled 2022-07-26 (×3): qty 1

## 2022-07-26 MED ORDER — BISACODYL 10 MG RE SUPP
10.0000 mg | Freq: Once | RECTAL | Status: AC
  Administered 2022-07-26: 10 mg via RECTAL
  Filled 2022-07-26: qty 1

## 2022-07-26 NOTE — Consult Note (Signed)
Reason for Consult: Anemia guaiac positivity Referring Physician: Hospital  team  [name] is an 87 y.o. male.  HPI: Patient seen and examined in his hospital computer chart reviewed and his case discussed with his wife as well and he was admitted for falling more frequently and he does have trouble walking and walks with a walker and he really has no GI issues although his wife says lately he has been holding liquids in his mouth and not swallowing it and needs a reminder although has no trouble swallowing food and he has had endoscopies and colonoscopies in the past but none in the last 10 years and is on chronic omeprazole but also takes an aspirin a day and despite being on iron he does not see any blood in his bowels or any black bowel movements and has no constipation his family history is negative from a GI standpoint he has no other complaints  Past Medical History:  Diagnosis Date   Anemia    Anginal pain (HCC)    Atrial flutter (HCC)    Bladder outlet obstruction    Carotid artery occlusion    Coronary artery disease    post stents; prior CABG; s/p cath January 2014 with 2 VD, patent SVG to OM3 and patent SVG to PL patent   Degenerative disk disease    GERD (gastroesophageal reflux disease)    History of esophageal stricture    "has it stretched q once in awhile" (03/26/2012"   Hyperlipidemia    Hypertension    Kidney stones    Major depression    OSA (obstructive sleep apnea)    "have a mask; haven't used one in years" (03/26/2012)   Prostate cancer (HCC)    S/P mitral valve replacement with bioprosthetic valve 02/20/2014   Skin cancer of nose 2012   "right side" (03/26/2012)   Skin cancer of trunk    "left chest" (03/26/2012)   Spinal stenosis    Stroke (HCC) 2014   NO RESIDUAL PROBLEMS   Type II diabetes mellitus (HCC)    Valvular heart disease     Past Surgical History:  Procedure Laterality Date   A-FLUTTER ABLATION N/A 12/01/2016   Procedure: A-Flutter  Ablation;  Surgeon: Hillis Range, MD;  Location: [facility];  Service: Cardiovascular;  Laterality: N/A;   ADENOIDECTOMY  1956   ANTERIOR CERVICAL DECOMP/DISCECTOMY FUSION  2000's   AORTIC VALVE REPLACEMENT  08/29/2010   25 mm CE Magna bioprosthetic - [facility]   APPENDECTOMY  1951   CARDIAC CATHETERIZATION  10/20/2003   Est. EF of 65% -- Critical disease in the mid to distal circumflex --  Preserved left ventricular  function --    CARDIAC VALVE REPLACEMENT     CAROTID ENDARTERECTOMY  05/05/2003   right carotid endarterectomy   CERVICAL DISC SURGERY  1980's   CORONARY ANGIOPLASTY WITH STENT PLACEMENT  10/20/2003   Percutaneous coronary intervention/drug-eluding stent implantation, third marginal branch --  Percutaneous closure, right femoral artery -- Atherosclerotic coronary vascular disease, single vessel / Status post successful percutaneous coronary intervention/tandem drug -- eluding stent implantation proximal third marginal branch -- Typical angina was not reproduced with device insertion or balloon    CORONARY ARTERY BYPASS GRAFT  08/29/2010   CABG X2; SVG to PDA, SVG to OM2- [facility]   HEMATOMA EVACUATION  05/09/2003   Evacuation of hematoma, right neck   LEFT HEART CATHETERIZATION WITH CORONARY ANGIOGRAM N/A 03/27/2012   Procedure: LEFT HEART CATHETERIZATION WITH CORONARY ANGIOGRAM;  Surgeon: Peter M Swaziland, MD;  Location: [facility] CATH LAB;  Service: Cardiovascular;  Laterality: N/A;   LUMBAR DISC SURGERY  1996   MITRAL VALVE REPAIR  08/23/2010   28 mm Simulus ring -[facility]   POSTERIOR FUSION LUMBAR SPINE  1997   PROSTATE BIOPSY     SHOULDER ARTHROSCOPY  01/07/2007   Left shoulder impingement, labral tear   SKIN CANCER EXCISION  1990's; 2012   "left chest & right nose" (03/26/2012)   TEE WITHOUT CARDIOVERSION N/A 05/15/2012   Procedure: TRANSESOPHAGEAL ECHOCARDIOGRAM (TEE);  Surgeon: Vesta Mixer, MD;  Location: [facility] ENDOSCOPY;  Service: Cardiovascular;  Laterality: N/A;   TEE WITHOUT  CARDIOVERSION N/A 12/01/2016   Procedure: TRANSESOPHAGEAL ECHOCARDIOGRAM (TEE);  Surgeon: Pricilla Riffle, MD;  Location: [facility] ENDOSCOPY;  Service: Cardiovascular;  Laterality: N/A;   TONSILLECTOMY AND ADENOIDECTOMY  1956   TRANSURETHRAL RESECTION OF PROSTATE N/A 04/26/2015   Procedure: TRANSURETHRAL RESECTION OF THE PROSTATE WITH GYRUS INSTRUMENTS;  Surgeon: Crist Fat, MD;  Location: [facility] ORS;  Service: Urology;  Laterality: N/A;  TURP-BIPOLAR    Family History  Problem Relation Age of Onset   Diabetes Mother    Coronary artery disease Mother    Heart disease Mother    Stroke Father    Cancer Father        prostate   Cancer Daughter        lymphoma hodgkins    Seizures Daughter     Social History:  reports that he quit smoking about 43 years ago. His smoking use included cigarettes. He has a 115.00 pack-year smoking history. He has never used smokeless tobacco. He reports that he does not drink alcohol and does not use drugs.  Allergies: No Known Allergies  Medications: I have reviewed the patient's current medications.  Results for orders placed or performed during the hospital encounter of 07/24/22 (from the past 48 hour(s))  Culture, blood (Routine x 2)     Status: None (Preliminary result)   Collection Time: 07/24/22  8:11 PM   Specimen: BLOOD  Result Value Ref Range   Specimen Description      BLOOD BLOOD LEFT FOREARM Performed at [facility], [address], [place], [place] [address]    Special Requests      BOTTLES DRAWN AEROBIC AND ANAEROBIC Blood Culture results may not be optimal due to an excessive volume of blood received in culture bottles Performed at [facility], [address] [place], [place] [address]    Culture      NO GROWTH 2 DAYS Performed at [facility], [address] [place], [place] [address]    Report Status PENDING   Urinalysis, w/ Reflex to Culture (Infection Suspected) -Urine, Clean Catch      Status: Abnormal   Collection Time: 07/24/22  8:11 PM  Result Value Ref Range   Specimen Source URINE, CATHETERIZED    Color, Urine YELLOW YELLOW   APPearance CLEAR CLEAR   Specific Gravity, Urine 1.012 1.005 - 1.030   pH 5.0 5.0 - 8.0   Glucose, UA 50 (A) NEGATIVE mg/dL   Hgb urine dipstick SMALL (A) NEGATIVE   Bilirubin Urine NEGATIVE NEGATIVE   Ketones, ur 5 (A) NEGATIVE mg/dL   Protein, ur 034 (A) NEGATIVE mg/dL   Nitrite NEGATIVE NEGATIVE   Leukocytes,Ua NEGATIVE NEGATIVE   RBC / HPF 6-10 0 - 5 RBC/hpf   WBC, UA 0-5 0 - 5 WBC/hpf    Comment:  Reflex urine culture not performed if WBC <=10, OR if Squamous epithelial cells >5. If Squamous epithelial cells >5 suggest recollection.    Bacteria, UA NONE SEEN NONE SEEN   Squamous Epithelial / HPF 0-5 0 - 5 /HPF    Comment: Performed at [facility], [address] [place], [place] [address]  Resp panel by RT-PCR (RSV, Flu A&B, Covid) Anterior Nasal Swab     Status: None   Collection Time: 07/24/22  8:16 PM   Specimen: Anterior Nasal Swab  Result Value Ref Range   SARS Coronavirus 2 by RT PCR NEGATIVE NEGATIVE    Comment: (NOTE) SARS-CoV-2 target nucleic acids are NOT DETECTED.  The SARS-CoV-2 RNA is generally detectable in upper respiratory specimens during the acute phase of infection. The lowest concentration of SARS-CoV-2 viral copies this assay can detect is 138 copies/mL. A negative result does not preclude SARS-Cov-2 infection and should not be used as the sole basis for treatment or other patient management decisions. A negative result may occur with  improper specimen collection/handling, submission of specimen other than nasopharyngeal swab, presence of viral mutation(s) within the areas targeted by this assay, and inadequate number of viral copies(<138 copies/mL). A negative result must be combined with clinical observations, patient history, and epidemiological information. The expected  result is Negative.  Fact Sheet for Patients:  BloggerCourse.com  Fact Sheet for Healthcare Providers:  SeriousBroker.it  This test is no t yet approved or cleared by the [place] FDA and  has been authorized for detection and/or diagnosis of SARS-CoV-2 by FDA under an Emergency Use Authorization (EUA). This EUA will remain  in effect (meaning this test can be used) for the duration of the COVID-19 declaration under Section 564(b)(1) of the Act, 21 U.S.C.section 360bbb-3(b)(1), unless the authorization is terminated  or revoked sooner.       Influenza A by PCR NEGATIVE NEGATIVE   Influenza B by PCR NEGATIVE NEGATIVE    Comment: (NOTE) The Xpert Xpress SARS-CoV-2/FLU/RSV plus assay is intended as an aid in the diagnosis of influenza from Nasopharyngeal swab specimens and should not be used as a sole basis for treatment. Nasal washings and aspirates are unacceptable for Xpert Xpress SARS-CoV-2/FLU/RSV testing.  Fact Sheet for Patients: BloggerCourse.com  Fact Sheet for Healthcare Providers: SeriousBroker.it  This test is not yet approved or cleared by the [place] FDA and has been authorized for detection and/or diagnosis of SARS-CoV-2 by FDA under an Emergency Use Authorization (EUA). This EUA will remain in effect (meaning this test can be used) for the duration of the COVID-19 declaration under Section 564(b)(1) of the Act, 21 U.S.C. section 360bbb-3(b)(1), unless the authorization is terminated or revoked.     Resp Syncytial Virus by PCR NEGATIVE NEGATIVE    Comment: (NOTE) Fact Sheet for Patients: BloggerCourse.com  Fact Sheet for Healthcare Providers: SeriousBroker.it  This test is not yet approved or cleared by the [place] FDA and has been authorized for detection and/or diagnosis of SARS-CoV-2  by FDA under an Emergency Use Authorization (EUA). This EUA will remain in effect (meaning this test can be used) for the duration of the COVID-19 declaration under Section 564(b)(1) of the Act, 21 U.S.C. section 360bbb-3(b)(1), unless the authorization is terminated or revoked.  Performed at [facility], [address] [place], [place] [address]   Comprehensive metabolic panel     Status: Abnormal   Collection Time: 07/24/22  8:16 PM  Result Value Ref Range   Sodium  135 135 - 145 mmol/L   Potassium 4.6 3.5 - 5.1 mmol/L   Chloride 103 98 - 111 mmol/L   CO2 21 (L) 22 - 32 mmol/L   Glucose, Bld 237 (H) 70 - 99 mg/dL    Comment: Glucose reference range applies only to samples taken after fasting for at least 8 hours.   BUN 23 8 - 23 mg/dL   Creatinine, Ser 6.96 (H) 0.61 - 1.24 mg/dL   Calcium 8.9 8.9 - 29.5 mg/dL   Total Protein 7.8 6.5 - 8.1 g/dL   Albumin 3.9 3.5 - 5.0 g/dL   AST 65 (H) 15 - 41 U/L   ALT 41 0 - 44 U/L   Alkaline Phosphatase 64 38 - 126 U/L   Total Bilirubin 0.8 0.3 - 1.2 mg/dL   GFR, Estimated 57 (L) >60 mL/min    Comment: (NOTE) Calculated using the CKD-EPI Creatinine Equation (2021)    Anion gap 11 5 - 15    Comment: Performed at [facility], [address], [place], [place] [address]  Lactic acid, plasma     Status: Abnormal   Collection Time: 07/24/22  8:16 PM  Result Value Ref Range   Lactic Acid, Venous 2.1 (HH) 0.5 - 1.9 mmol/L    Comment: CRITICAL RESULT CALLED TO, READ BACK BY AND VERIFIED WITH BONIS,G RN @2115  07/24/22 BY CHIOLDRESS,E Performed at [facility], [address], [place], [place] [address]   CBC with Differential     Status: Abnormal   Collection Time: 07/24/22  8:16 PM  Result Value Ref Range   WBC 11.6 (H) 4.0 - 10.5 K/uL   RBC 2.76 (L) 4.22 - 5.81 MIL/uL   Hemoglobin 8.3 (L) 13.0 - 17.0 g/dL   HCT 02.7 (L) 25.3 - 66.4 %   MCV 93.1 80.0 - 100.0 fL   MCH 30.1 26.0 -  34.0 pg   MCHC 32.3 30.0 - 36.0 g/dL   RDW 40.3 47.4 - 25.9 %   Platelets 245 150 - 400 K/uL   nRBC 0.0 0.0 - 0.2 %   Neutrophils Relative % 85 %   Neutro Abs 9.9 (H) 1.7 - 7.7 K/uL   Lymphocytes Relative 6 %   Lymphs Abs 0.8 0.7 - 4.0 K/uL   Monocytes Relative 8 %   Monocytes Absolute 0.9 0.1 - 1.0 K/uL   Eosinophils Relative 0 %   Eosinophils Absolute 0.1 0.0 - 0.5 K/uL   Basophils Relative 0 %   Basophils Absolute 0.0 0.0 - 0.1 K/uL   Immature Granulocytes 1 %   Abs Immature Granulocytes 0.06 0.00 - 0.07 K/uL    Comment: Performed at [facility], [address], [place], [place] [address]  Protime-INR     Status: None   Collection Time: 07/24/22  8:16 PM  Result Value Ref Range   Prothrombin Time 15.2 11.4 - 15.2 seconds   INR 1.2 0.8 - 1.2    Comment: (NOTE) INR goal varies based on device and disease states. Performed at [facility], [address] [place], [place] [address]   Culture, blood (Routine x 2)     Status: None (Preliminary result)   Collection Time: 07/24/22  8:16 PM   Specimen: BLOOD  Result Value Ref Range   Specimen Description      BLOOD LEFT ANTECUBITAL Performed at [facility], [address] [place], [place] [address]    Special Requests  BOTTLES DRAWN AEROBIC AND ANAEROBIC Blood Culture adequate volume Performed at [facility], [address] [place], [place] [address]    Culture      NO GROWTH 2 DAYS Performed at [facility], [address] [place] [address], [place], [place] [address]    Report Status PENDING   Troponin I (High Sensitivity)     Status: Abnormal   Collection Time: 07/24/22  8:16 PM  Result Value Ref Range   Troponin I (High Sensitivity) 25 (H) <18 ng/L    Comment: (NOTE) Elevated high sensitivity troponin I (hsTnI) values and significant  changes across serial measurements may suggest ACS but many other  chronic and acute conditions are known to  elevate hsTnI results.  Refer to the "Links" section for chest pain algorithms and additional  guidance. Performed at [facility], [address] [place], [place] [address]   Brain natriuretic peptide     Status: Abnormal   Collection Time: 07/24/22  8:16 PM  Result Value Ref Range   B Natriuretic Peptide 282.6 (H) 0.0 - 100.0 pg/mL    Comment: Performed at [facility], [address], [place], [place] [address]  Magnesium     Status: None   Collection Time: 07/24/22  8:16 PM  Result Value Ref Range   Magnesium 2.2 1.7 - 2.4 mg/dL    Comment: Performed at [facility], [address], [place], [place] [address]  Lactic acid, plasma     Status: None   Collection Time: 07/24/22 10:45 PM  Result Value Ref Range   Lactic Acid, Venous 0.9 0.5 - 1.9 mmol/L    Comment: Performed at [facility], [address] [place], [place] [address]  Troponin I (High Sensitivity)     Status: Abnormal   Collection Time: 07/24/22 10:45 PM  Result Value Ref Range   Troponin I (High Sensitivity) 62 (H) <18 ng/L    Comment: READ BACK AND VERIFIED WITH BONIS,G RN @2348  07/24/22 BY CHILDRESS,E (NOTE) Elevated high sensitivity troponin I (hsTnI) values and significant  changes across serial measurements may suggest ACS but many other  chronic and acute conditions are known to elevate hsTnI results.  Refer to the "Links" section for chest pain algorithms and additional  guidance. Performed at [facility], [address] [place], [place] [address]   Type and screen [facility]     Status: None (Preliminary result)   Collection Time: 07/25/22  6:05 AM  Result Value Ref Range   ABO/RH(D) A POS    Antibody Screen NEG    Sample Expiration 07/28/2022,2359    Unit Number [id number]    Blood Component Type RED CELLS,LR    Unit division 00    Status of Unit ISSUED    Transfusion Status OK  TO TRANSFUSE    Crossmatch Result      Compatible Performed at [facility], [address] [place], [place] [address]   CBC with Differential/Platelet     Status: Abnormal   Collection Time: 07/25/22  6:09 AM  Result Value Ref Range   WBC 9.7 4.0 - 10.5 K/uL   RBC 2.43 (L) 4.22 - 5.81 MIL/uL   Hemoglobin 7.1 (L) 13.0 - 17.0 g/dL   HCT 44.0 (L) 34.7 - 42.5 %   MCV 96.3 80.0 - 100.0 fL   MCH 29.2 26.0 - 34.0 pg   MCHC 30.3 30.0 - 36.0 g/dL   RDW  15.2 11.5 - 15.5 %   Platelets 208 150 - 400 K/uL   nRBC 0.0 0.0 - 0.2 %   Neutrophils Relative % 76 %   Neutro Abs 7.3 1.7 - 7.7 K/uL   Lymphocytes Relative 9 %   Lymphs Abs 0.9 0.7 - 4.0 K/uL   Monocytes Relative 11 %   Monocytes Absolute 1.1 (H) 0.1 - 1.0 K/uL   Eosinophils Relative 3 %   Eosinophils Absolute 0.3 0.0 - 0.5 K/uL   Basophils Relative 0 %   Basophils Absolute 0.0 0.0 - 0.1 K/uL   Immature Granulocytes 1 %   Abs Immature Granulocytes 0.07 0.00 - 0.07 K/uL    Comment: Performed at [facility], [address] [place], [place] [address]  Comprehensive metabolic panel     Status: Abnormal   Collection Time: 07/25/22  6:09 AM  Result Value Ref Range   Sodium 135 135 - 145 mmol/L   Potassium 4.1 3.5 - 5.1 mmol/L   Chloride 106 98 - 111 mmol/L   CO2 19 (L) 22 - 32 mmol/L   Glucose, Bld 214 (H) 70 - 99 mg/dL    Comment: Glucose reference range applies only to samples taken after fasting for at least 8 hours.   BUN 21 8 - 23 mg/dL   Creatinine, Ser 6.04 0.61 - 1.24 mg/dL   Calcium 8.1 (L) 8.9 - 10.3 mg/dL   Total Protein 6.5 6.5 - 8.1 g/dL   Albumin 3.1 (L) 3.5 - 5.0 g/dL   AST 38 15 - 41 U/L   ALT 32 0 - 44 U/L   Alkaline Phosphatase 51 38 - 126 U/L   Total Bilirubin 0.7 0.3 - 1.2 mg/dL   GFR, Estimated >54 >09 mL/min    Comment: (NOTE) Calculated using the CKD-EPI Creatinine Equation (2021)    Anion gap 10 5 - 15    Comment: Performed at [facility], [address], [place], [place] [address]  Magnesium     Status: None   Collection Time: 07/25/22  6:09 AM  Result Value Ref Range   Magnesium 1.8 1.7 - 2.4 mg/dL    Comment: Performed at [facility], [address] [place], [place] [address]  Phosphorus     Status: None   Collection Time: 07/25/22  6:09 AM  Result Value Ref Range   Phosphorus 3.5 2.5 - 4.6 mg/dL    Comment: Performed at [facility], [address] [place], [place] [address]  Troponin I (High Sensitivity)     Status: Abnormal   Collection Time: 07/25/22  6:09 AM  Result Value Ref Range   Troponin I (High Sensitivity) 38 (H) <18 ng/L    Comment: DELTA CHECK NOTED (NOTE) Elevated high sensitivity troponin I (hsTnI) values and significant  changes across serial measurements may suggest ACS but many other  chronic and acute conditions are known to elevate hsTnI results.  Refer to the "Links" section for chest pain algorithms and additional  guidance. Performed at [facility], [address] [place], [place] [address]   Blood gas, venous     Status: Abnormal   Collection Time: 07/25/22  6:09 AM  Result Value Ref Range   pH, Ven 7.33 7.25 - 7.43   pCO2, Ven 39 (L) 44 - 60 mmHg   pO2, Ven <31 (LL) 32 - 45 mmHg    Comment: CRITICAL RESULT CALLED TO, READ BACK BY AND VERIFIED WITH: Ernst Spell. RN  AT 1610 07/25/22 MULLINS,T    Bicarbonate 20.6 20.0 - 28.0 mmol/L   Acid-base deficit 4.9 (H) 0.0 - 2.0 mmol/L   O2 Saturation 23.1 %   Patient temperature 37.1     Comment: Performed at [facility], [address] [place], [place] [address]  Procalcitonin     Status: None   Collection Time: 07/25/22  6:09 AM  Result Value Ref Range   Procalcitonin 1.81 ng/mL    Comment:        Interpretation: PCT > 0.5 ng/mL and <= 2 ng/mL: Systemic infection (sepsis) is possible, but other conditions are known to elevate PCT as well. (NOTE)       Sepsis  PCT Algorithm           Lower Respiratory Tract                                      Infection PCT Algorithm    ----------------------------     ----------------------------         PCT < 0.25 ng/mL                PCT < 0.10 ng/mL          Strongly encourage             Strongly discourage   discontinuation of antibiotics    initiation of antibiotics    ----------------------------     -----------------------------       PCT 0.25 - 0.50 ng/mL            PCT 0.10 - 0.25 ng/mL               OR       >80% decrease in PCT            Discourage initiation of                                            antibiotics      Encourage discontinuation           of antibiotics    ----------------------------     -----------------------------         PCT >= 0.50 ng/mL              PCT 0.26 - 0.50 ng/mL                AND       <80% decrease in PCT             Encourage initiation of                                             antibiotics       Encourage continuation           of antibiotics    ----------------------------     -----------------------------        PCT >= 0.50 ng/mL                  PCT > 0.50 ng/mL               AND  increase in PCT                  Strongly encourage                                      initiation of antibiotics    Strongly encourage escalation           of antibiotics                                     -----------------------------                                           PCT <= 0.25 ng/mL                                                 OR                                        > 80% decrease in PCT                                      Discontinue / Do not initiate                                             antibiotics  Performed at [facility], [address], [place], [place] [address]   Vitamin B12     Status: None   Collection Time: 07/25/22  6:09 AM  Result Value Ref Range   Vitamin B-12 201 180 - 914 pg/mL    Comment:  (NOTE) This assay is not validated for testing neonatal or myeloproliferative syndrome specimens for Vitamin B12 levels. Performed at [facility], [address] [place], [place] [address]   TSH     Status: None   Collection Time: 07/25/22  6:09 AM  Result Value Ref Range   TSH 2.206 0.350 - 4.500 uIU/mL    Comment: Performed by a 3rd Generation assay with a functional sensitivity of <=0.01 uIU/mL. Performed at [facility], [address] [place], [place] [address]   Reticulocytes     Status: Abnormal   Collection Time: 07/25/22  6:09 AM  Result Value Ref Range   Retic Ct Pct 1.8 0.4 - 3.1 %   RBC. 2.37 (L) 4.22 - 5.81 MIL/uL   Retic Count, Absolute 41.5 19.0 - 186.0 K/uL   Immature Retic Fract 18.2 (H) 2.3 - 15.9 %    Comment: Performed at [facility], [address] [place], [place] [address]  Iron and TIBC     Status: Abnormal   Collection Time: 07/25/22  6:09 AM  Result Value Ref Range   Iron 16 (L) 45 - 182 ug/dL   TIBC 657 846 -  450 ug/dL   Saturation Ratios 5 (L) 17.9 - 39.5 %   UIBC 338 ug/dL    Comment: Performed at [facility], [address] [place], [place] [address]  Ferritin     Status: None   Collection Time: 07/25/22  6:09 AM  Result Value Ref Range   Ferritin 101 24 - 336 ng/mL    Comment: Performed at [facility], [address] [place], [place] [address]  Folate     Status: None   Collection Time: 07/25/22  6:09 AM  Result Value Ref Range   Folate 26.9 >5.9 ng/mL    Comment: RESULT CONFIRMED BY MANUAL DILUTION Performed at [facility], [address] [place], [place] [address]   Hemoglobin A1c     Status: Abnormal   Collection Time: 07/25/22  6:09 AM  Result Value Ref Range   Hgb A1c MFr Bld 7.0 (H) 4.8 - 5.6 %    Comment: (NOTE) Pre diabetes:          5.7%-6.4%  Diabetes:              >6.4%  Glycemic control for    <7.0% adults with diabetes    Mean Plasma Glucose 154.2 mg/dL    Comment: Performed at [facility], [address], [place], [place] [address]  Glucose, capillary     Status: Abnormal   Collection Time: 07/25/22  7:30 AM  Result Value Ref Range   Glucose-Capillary 206 (H) 70 - 99 mg/dL    Comment: Glucose reference range applies only to samples taken after fasting for at least 8 hours.  ABO/Rh     Status: None   Collection Time: 07/25/22  9:20 AM  Result Value Ref Range   ABO/RH(D)      A POS Performed at [facility], [address] [place], [place] [address]   Glucose, capillary     Status: Abnormal   Collection Time: 07/25/22 12:00 PM  Result Value Ref Range   Glucose-Capillary 189 (H) 70 - 99 mg/dL    Comment: Glucose reference range applies only to samples taken after fasting for at least 8 hours.  Glucose, capillary     Status: Abnormal   Collection Time: 07/25/22  5:43 PM  Result Value Ref Range   Glucose-Capillary 231 (H) 70 - 99 mg/dL    Comment: Glucose reference range applies only to samples taken after fasting for at least 8 hours.  Glucose, capillary     Status: Abnormal   Collection Time: 07/25/22  9:17 PM  Result Value Ref Range   Glucose-Capillary 206 (H) 70 - 99 mg/dL    Comment: Glucose reference range applies only to samples taken after fasting for at least 8 hours.  CBC     Status: Abnormal   Collection Time: 07/26/22  5:53 AM  Result Value Ref Range   WBC 9.9 4.0 - 10.5 K/uL   RBC 2.24 (L) 4.22 - 5.81 MIL/uL   Hemoglobin 6.8 (LL) 13.0 - 17.0 g/dL    Comment: REPEATED TO VERIFY THIS CRITICAL RESULT HAS VERIFIED AND BEEN CALLED TO Leitha Schuller RN BY NICOLE MCCOY ON 05 08 2024 AT 0659, AND HAS BEEN READ BACK.     HCT 21.1 (L) 39.0 - 52.0 %   MCV 94.2 80.0 - 100.0 fL   MCH 30.4 26.0 - 34.0 pg   MCHC 32.2 30.0 - 36.0 g/dL   RDW 13.0 86.5 - 78.4 %  Platelets 218 150 - 400 K/uL   nRBC 0.0 0.0 - 0.2 %    Comment: Performed at  [facility], [address] [place], [place] [address]  Basic metabolic panel     Status: Abnormal   Collection Time: 07/26/22  5:53 AM  Result Value Ref Range   Sodium 133 (L) 135 - 145 mmol/L   Potassium 3.5 3.5 - 5.1 mmol/L   Chloride 103 98 - 111 mmol/L   CO2 20 (L) 22 - 32 mmol/L   Glucose, Bld 200 (H) 70 - 99 mg/dL    Comment: Glucose reference range applies only to samples taken after fasting for at least 8 hours.   BUN 17 8 - 23 mg/dL   Creatinine, Ser 6.04 0.61 - 1.24 mg/dL   Calcium 8.3 (L) 8.9 - 10.3 mg/dL   GFR, Estimated >54 >09 mL/min    Comment: (NOTE) Calculated using the CKD-EPI Creatinine Equation (2021)    Anion gap 10 5 - 15    Comment: Performed at [facility], [address], [place], [place] [address]  Glucose, capillary     Status: Abnormal   Collection Time: 07/26/22  7:29 AM  Result Value Ref Range   Glucose-Capillary 190 (H) 70 - 99 mg/dL    Comment: Glucose reference range applies only to samples taken after fasting for at least 8 hours.  Occult blood card to lab, stool     Status: Abnormal   Collection Time: 07/26/22  8:14 AM  Result Value Ref Range   Fecal Occult Bld POSITIVE (A) NEGATIVE    Comment: Performed at [facility], [address] [place], [place] [address]  Prepare RBC (crossmatch)     Status: None   Collection Time: 07/26/22  8:17 AM  Result Value Ref Range   Order Confirmation      ORDER PROCESSED BY BLOOD BANK Performed at [facility], [address], [place], [place] [address]   Glucose, capillary     Status: Abnormal   Collection Time: 07/26/22 12:11 PM  Result Value Ref Range   Glucose-Capillary 213 (H) 70 - 99 mg/dL    Comment: Glucose reference range applies only to samples taken after fasting for at least 8 hours.    ECHOCARDIOGRAM COMPLETE  Result Date: 07/25/2022    ECHOCARDIOGRAM REPORT   Patient Name:   [name] Date of Exam:  07/25/2022 Medical Rec #:  [id number]        Height:       66.0 in Accession #:    [id number]       Weight:       169.3 lb Date of Birth:  [birth year]        BSA:          1.863 m Patient Age:    86 years         BP:           118/68 mmHg Patient Gender: M                HR:           75 bpm. Exam Location:  Inpatient Procedure: 2D Echo, Color Doppler and Cardiac Doppler Indications:    Elevated Troponin  History:        Patient has prior history of Echocardiogram examinations, most                 recent 12/01/2016. CAD, Prior CABG, Aortic  Valve Disease, Mitral                 Valve Disease and s/p AVR w/ 25mm CE Magna and MV Repair w/ 28mm                 Simulus Ring 08/2010; Risk Factors:Hypertension, Diabetes and                 Dyslipidemia. Sepsis, Acute Hypoxic Respiratory Failure,                 Elevated Troponin.                  Mitral Valve: 28 mm prosthetic annuloplasty ring valve is                 present in the mitral position. Procedure Date: 08/2010.  Sonographer:    Milbert Coulter Referring Phys: [id number] JUSTIN B HOWERTER IMPRESSIONS  1. Left ventricular ejection fraction, by estimation, is 55 to 60%. The left ventricle has normal function. The left ventricle has no regional wall motion abnormalities. Left ventricular diastolic function could not be evaluated.  2. Right ventricular systolic function is mildly reduced. The right ventricular size is moderately enlarged. There is moderately elevated pulmonary artery systolic pressure. The estimated right ventricular systolic pressure is 54.3 mmHg.  3. Left atrial size was mildly dilated.  4. Right atrial size was mildly dilated.  5. 26 mm annuloplasty ring. MG 9 mm HG @ 77 bpm. Higher than reported at [facility] 06/05/2022 (4 mmHG @ 69 bpm). Suspect a mild element of stenosis with the small annuloplasty ring with higher gradient on this study explained by higher heart rate/sepsis/anemia. Would recommend repeat echo after acute illness. The mitral valve has been  repaired/replaced. No evidence of mitral valve regurgitation. Mild mitral stenosis. The mean mitral valve gradient is 9.0 mmHg with average heart rate of 77 bpm. There is a 28 mm prosthetic annuloplasty ring present in the mitral position. Procedure Date: 08/2010.  6. Tricuspid valve regurgitation is mild to moderate.  7. 25 mm CE bioprosthetic aortic valve replacement. Vmax 2.6 m/s, MG 17 mmHG, EOA 1.56 cm2. MG 8 mmHG at [facility] (06/05/2022). Gradients could be higher due to sepsis/anemia. Would recommend repeat echo after acute illness. The aortic valve has been repaired/replaced. Aortic valve regurgitation is not visualized. Procedure Date: 08/2010. Aortic valve area, by VTI measures 1.56 cm. Aortic valve mean gradient measures 17.0 mmHg. Aortic valve Vmax measures 2.61 m/s.  8. The inferior vena cava is normal in size with greater than 50% respiratory variability, suggesting right atrial pressure of 3 mmHg. Comparison(s): Prior images unable to be directly viewed, comparison made by report only. Changes from prior study are noted. Gradients higher across Aoritc prosthesis and MV repair compared with [facility] study 06/05/2022. LV function stable. RV function reduced and moderately dilated RV chamber compared to what was reported at [facility]. FINDINGS  Left Ventricle: Left ventricular ejection fraction, by estimation, is 55 to 60%. The left ventricle has normal function. The left ventricle has no regional wall motion abnormalities. The left ventricular internal cavity size was normal in size. There is  no left ventricular hypertrophy. Abnormal (paradoxical) septal motion consistent with post-operative status. Left ventricular diastolic function could not be evaluated due to mitral valve repair. Left ventricular diastolic function could not be evaluated. Right Ventricle: The right ventricular size is moderately enlarged. No increase in right ventricular wall thickness. Right ventricular systolic function is mildly reduced. There  is moderately elevated pulmonary artery systolic pressure. The tricuspid regurgitant velocity is 3.58 m/s, and with an assumed right atrial pressure of 3 mmHg, the estimated right ventricular systolic pressure is 54.3 mmHg. Left Atrium: Left atrial size was mildly dilated. Right Atrium: Right atrial size was mildly dilated. Pericardium: There is no evidence of pericardial effusion. Mitral Valve: 26 mm annuloplasty ring. MG 9 mm HG @ 77 bpm. Higher than reported at [facility] 06/05/2022 (4 mmHG @ 69 bpm). Suspect a mild element of stenosis with the small annuloplasty ring with higher gradient on this study explained by higher heart rate/sepsis/anemia. Would recommend repeat echo after acute illness. The mitral valve has been repaired/replaced. No evidence of mitral valve regurgitation. There is a 28 mm prosthetic annuloplasty ring present in the mitral position. Procedure Date: 08/2010. Mild mitral valve stenosis. MV peak gradient, 22.5 mmHg. The mean mitral valve gradient is 9.0 mmHg with average heart rate of 77 bpm. Tricuspid Valve: The tricuspid valve is grossly normal. Tricuspid valve regurgitation is mild to moderate. No evidence of tricuspid stenosis. Aortic Valve: 25 mm CE bioprosthetic aortic valve replacement. Vmax 2.6 m/s, MG 17 mmHG, EOA 1.56 cm2. MG 8 mmHG at [facility] (06/05/2022). Gradients could be higher due to sepsis/anemia. Would recommend repeat echo after acute illness. The aortic valve has been repaired/replaced. Aortic valve regurgitation is not visualized. Aortic valve mean gradient measures 17.0 mmHg. Aortic valve peak gradient measures 27.2 mmHg. Aortic valve area, by VTI measures 1.56 cm. There is a 25 mm valve present in the aortic position. Pulmonic Valve: The pulmonic valve was grossly normal. Pulmonic valve regurgitation is mild to moderate. No evidence of pulmonic stenosis. Aorta: The aortic root and ascending aorta are structurally normal, with no evidence of dilitation. Venous: The inferior vena  cava is normal in size with greater than 50% respiratory variability, suggesting right atrial pressure of 3 mmHg. IAS/Shunts: The atrial septum is grossly normal.  LEFT VENTRICLE PLAX 2D LVIDd:         5.20 cm     Diastology LVIDs:         4.00 cm     LV e' medial:    5.33 cm/s LV PW:         0.90 cm     LV E/e' medial:  31.9 LV IVS:        1.10 cm     LV e' lateral:   11.20 cm/s LVOT diam:     2.24 cm     LV E/e' lateral: 15.2 LV SV:         83 LV SV Index:   45 LVOT Area:     3.94 cm  LV Volumes (MOD) LV vol d, MOD A2C: 78.2 ml LV vol d, MOD A4C: 74.6 ml LV vol s, MOD A2C: 30.4 ml LV vol s, MOD A4C: 33.3 ml LV SV MOD A2C:     47.8 ml LV SV MOD A4C:     74.6 ml LV SV MOD BP:      44.6 ml RIGHT VENTRICLE RV Basal diam:  3.60 cm RV Mid diam:    2.80 cm RV S prime:     10.90 cm/s TAPSE (M-mode): 1.4 cm LEFT ATRIUM             Index        RIGHT ATRIUM           Index LA diam:        4.60 cm 2.47 cm/m  RA Area:     19.60 cm LA Vol (A2C):   71.0 ml 38.11 ml/m  RA Volume:   59.50 ml  31.94 ml/m LA Vol (A4C):   57.6 ml 30.92 ml/m LA Biplane Vol: 65.7 ml 35.26 ml/m  AORTIC VALVE AV Area (Vmax):    1.31 cm AV Area (Vmean):   1.33 cm AV Area (VTI):     1.56 cm AV Vmax:           261.00 cm/s AV Vmean:          192.000 cm/s AV VTI:            0.532 m AV Peak Grad:      27.2 mmHg AV Mean Grad:      17.0 mmHg LVOT Vmax:         86.50 cm/s LVOT Vmean:        64.700 cm/s LVOT VTI:          0.211 m LVOT/AV VTI ratio: 0.40 MITRAL VALVE                TRICUSPID VALVE MV Area (PHT): 2.62 cm     TR Peak grad:   51.3 mmHg MV Area VTI:   1.15 cm     TR Vmax:        358.00 cm/s MV Peak grad:  22.5 mmHg MV Mean grad:  9.0 mmHg     SHUNTS MV Vmax:       2.37 m/s     Systemic VTI:  0.21 m MV Vmean:      141.0 cm/s   Systemic Diam: 2.24 cm MV VTI:        0.72 m MV Decel Time: 290 msec MV E velocity: 170.00 cm/s MV A velocity: 98.50 cm/s MV E/A ratio:  1.73 Lennie Odor MD Electronically signed by Lennie Odor MD Signature  Date/Time: 07/25/2022/11:00:12 AM    Final    CT Head Wo Contrast  Result Date: 07/24/2022 CLINICAL DATA:  Mental status change, fall. EXAM: CT HEAD WITHOUT CONTRAST TECHNIQUE: Contiguous axial images were obtained from the base of the skull through the vertex without intravenous contrast. RADIATION DOSE REDUCTION: This exam was performed according to the departmental dose-optimization program which includes automated exposure control, adjustment of the mA and/or kV according to patient size and/or use of iterative reconstruction technique. COMPARISON:  Head CT 06/16/2003.  MRI brain 04/12/2016. FINDINGS: Brain: No evidence of acute infarction, hemorrhage, hydrocephalus, extra-axial collection or mass lesion/mass effect. 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 Mild diffuse atrophy is unchanged. Vascular: Atherosclerotic calcifications are present within the cavernous internal carotid arteries. Skull: Normal. Negative for fracture or focal lesion. Sinuses/Orbits: No acute finding. Other: None. IMPRESSION: 1. No acute intracranial abnormality. 2. 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 3. Mild diffuse atrophy is unchanged. Electronically Signed   By: Darliss Cheney M.D.   On: 07/24/2022 21:15   DG Chest 2 View  Result Date: 07/24/2022 CLINICAL DATA:  Suspected sepsis, fall. EXAM: CHEST - 2 VIEW COMPARISON:  12/05/2016. FINDINGS: Heart is enlarged and the mediastinal contour is within normal limits. 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 There is atherosclerotic calcification of the aorta. Lung volumes are low and mild interstitial prominence is noted bilaterally. No effusion or pneumothorax. Sternotomy wires and cervical spinal fusion hardware are noted. Degenerative changes are present in the thoracic spine. IMPRESSION: 1. Cardiomegaly. 2. Interstitial prominence bilaterally, which may be accentuated due to  low lung volumes. Electronically Signed   By: Thornell Sartorius M.D.   On: 07/24/2022 20:43    ROS negative except above Blood pressure (!) 116/49, pulse 72, temperature 98 F (36.7 C), temperature source Oral, resp. rate 15, height 5\' 6"  (1.676 m), weight 76.8 kg, SpO2 96 %. Physical Exam vital signs stable afebrile no acute distress patient lying comfortably in the bed abdomen is soft nontender iron indices more of anemia of chronic disease plan is slight drop in hemoglobin with hydration without signs of obvious bleeding BUN and creatinine normal  Assessment/Plan: Chronic anemia with slight drop in patient on an aspirin a day Plan: We discussed endoscopy and we will proceed tomorrow but doubt I would put him through a colonoscopy based on his current status and he might need to see a neurologist or hematologist for his anemia particularly if chronic anemia is playing a role with his falling and might consider a one-time abdominal pelvic CT if endoscopy is not revealing just to rule out anything more significant as well  Shamicka Inga E 07/26/2022, 2:26 PM

## 2022-07-26 NOTE — Progress Notes (Signed)
PROGRESS NOTE  [name]  ZOX:[id number] DOB: [birth year] DOA: 07/24/2022 PCP: Renford Dills, MD   Brief Narrative: Patient is a 87 year old male with history of coronary artery  disease status post CABG, hypertension, hyperlipidemia, type 2 diabetes, anemia of chronic disease with a baseline hemoglobin in the range of 9-10 who presented to the emergency department with complaint of generalized weakness, nonproductive cough, mild shortness of breath.  On presentation, he was febrile, blood pressure stable, saturating 89% on room air and was put on supplemental oxygen at 2 L/min.  Lab work  showed creatinine of 1.2, BNP of 282, mildly elevated troponin with flat trend, white cell count of 11.6, hemoglobin of 8.3.  Blood cultures was collected in the ED.  Chest x-ray showed bilateral interstitial prominence, cardiomegaly.  Patient was suspected to have acute bronchitis and was admitted, started on antibiotics.  Hospital course remarkable for anemia, hemoglobin dropped to the range of 6, FOBT positive.  GI consulted.  Anemia, hemoglobin dropped to the range of 6, FOBT positive.  GI consulted.  Assessment & Plan:  Principal Problem:   Acute bronchitis Active Problems:   Essential hypertension   DM2 (diabetes mellitus, type 2) (HCC)   Severe sepsis (HCC)   Generalized weakness   Acute hypoxic respiratory failure (HCC)   Elevated troponin   Acute on chronic anemia   Hyperlipidemia   Depression   Severe sepsis secondary to bronchitis/acute hypoxic respiratory failure: Presented  with 2 days history of cough, shortness of breath, fever.  CXR did not show  any clear pneumonia.  Procalcitonin elevated at 1.8 COVID/flu/influenza negative.  Elevated lactate level, leukocytes on presentation which have resolved.    Given a dose of vancomycin and cefepime in the emergency room.  Currently on azithromycin, ceftriaxone.  Continue bronchodilators. Respiratory status significantly improved.  This  morning he is on room air.  Acute on chronic normocytic anemia: Baseline hemoglobin ranges from 9-11.   No report of hematochezia or melena. Iron panel showed severe iron deficiency.  Given  IV iron.  Relatively low vitamin B12 of 201.  Started supplementation.  Folic acid level normal. Hemoglobin at the end of 6.  FOBT positive.  Started on Protonix IV twice daily.  GI consulted for consideration of EGD/coloscopy.  He had colonoscopy  several years ago.  Needs to R/O bowel malignancy  Elevated BNP/troponin: Denies any chest pain.  Troponin is elevated with flat trend likely from demand ischemia.  Elevated BNP on presentation.  Echo showed EF of 55 to 60%, no wall motion abnormality, left ventricular diastolic function could not be evaluated.  He is on as needed Lasix at home.  Appears euvolemic this morning.  Restarted home Lasix at the scheduled 20 mg, also spironolactone. He has history of mitral valve/atrial valve replacement.  Follows with [facility]  Hypertension: Takes losartan, metoprolol at home.  Currently blood stable.  Home metoprolol resumed  Hyperlipidemia: On Crestor  Type 2 diabetes: Takes insulin, metformin at home.  A1c of 7 Continue sliding-scale insulin  Depression: On Prozac.  Generalized weakness/deconditioning: 2 days history of onset of weakness, no focal neurological deficits.  PT/OT consulted.  Recommend home health .ambulates with the help of walker at home, has fallen off on recently.  CT head did not show any acute findings.         DVT prophylaxis:SCDs Start: 07/24/22 2209     Code Status: Full Code  Family Communication: Wife at bedside  Patient status:Inpatient  Patient is from :Home  Anticipated discharge ZO:XWRU  Estimated DC date: After full GI workup   Consultants: GI  Procedures:None  Antimicrobials:  Anti-infectives (From admission, onward)    Start     Dose/Rate Route Frequency Ordered Stop   07/25/22 0830  cefTRIAXone (ROCEPHIN) 2 g in  sodium chloride 0.9 % 100 mL IVPB        2 g 200 mL/hr over 30 Minutes Intravenous Daily 07/25/22 0814     07/25/22 0800  cefTRIAXone (ROCEPHIN) 1 g in sodium chloride 0.9 % 100 mL IVPB  Status:  Discontinued        1 g 200 mL/hr over 30 Minutes Intravenous Every 24 hours 07/24/22 2223 07/25/22 0814   07/24/22 2300  azithromycin (ZITHROMAX) 500 mg in sodium chloride 0.9 % 250 mL IVPB        500 mg 250 mL/hr over 60 Minutes Intravenous Every 24 hours 07/24/22 2223     07/24/22 2030  vancomycin (VANCOREADY) IVPB 1500 mg/300 mL        1,500 mg 150 mL/hr over 120 Minutes Intravenous  Once 07/24/22 2028 07/24/22 2305   07/24/22 2030  ceFEPIme (MAXIPIME) 2 g in sodium chloride 0.9 % 100 mL IVPB        2 g 200 mL/hr over 30 Minutes Intravenous  Once 07/24/22 2028 07/24/22 2150       Subjective: Patient seen and examined at bedside today.  He looks much better today.  His weakness has improved.  He is not complain of any shortness of breath or cough today. Hemoglobin dropped to the range of 6.  He denies any hematochezia or melena.  Denies any abdominal pain  Objective: Vitals:   07/25/22 2115 07/26/22 0432 07/26/22 0840 07/26/22 1115  BP: (!) 125/107 (!) 125/58  122/67  Pulse: 85 68  72  Resp: 16 16    Temp: 98.8 F (37.1 C) 97.8 F (36.6 C)    TempSrc: Oral Oral    SpO2: 93% 90% 92%   Weight:      Height:        Intake/Output Summary (Last 24 hours) at 07/26/2022 1355 Last data filed at 07/26/2022 0929 Gross per 24 hour  Intake 873.03 ml  Output 1400 ml  Net -526.97 ml   Filed Weights   07/24/22 2008 07/25/22 0500  Weight: 72.6 kg 76.8 kg    Examination:   General exam: Overall comfortable, not in distress HEENT: PERRL Respiratory system:  no wheezes or crackles , mildly diminished sounds bilaterally Cardiovascular system: S1 & S2 heard, RRR.  Gastrointestinal system: Abdomen is nondistended, soft and nontender. Central nervous system: Alert and oriented Extremities:  No edema, no clubbing ,no cyanosis Skin: No rashes, no ulcers,no icterus     Data Reviewed: I have personally reviewed following labs and imaging studies  CBC: Recent Labs  Lab 07/24/22 2016 07/25/22 0609 07/26/22 0553  WBC 11.6* 9.7 9.9  NEUTROABS 9.9* 7.3  --   HGB 8.3* 7.1* 6.8*  HCT 25.7* 23.4* 21.1*  MCV 93.1 96.3 94.2  PLT 245 208 218   Basic Metabolic Panel: Recent Labs  Lab 07/24/22 2016 07/25/22 0609 07/26/22 0553  NA 135 135 133*  K 4.6 4.1 3.5  CL 103 106 103  CO2 21* 19* 20*  GLUCOSE 237* 214* 200*  BUN 23 21 17   CREATININE 1.25* 1.08 1.12  CALCIUM 8.9 8.1* 8.3*  MG 2.2 1.8  --   PHOS  --  3.5  --      Recent  Results (from the past 240 hour(s))  Culture, blood (Routine x 2)     Status: None (Preliminary result)   Collection Time: 07/24/22  8:11 PM   Specimen: BLOOD  Result Value Ref Range Status   Specimen Description   Final    BLOOD BLOOD LEFT FOREARM Performed at [facility], [address], [place], [place] [address]    Special Requests   Final    BOTTLES DRAWN AEROBIC AND ANAEROBIC Blood Culture results may not be optimal due to an excessive volume of blood received in culture bottles Performed at [facility], [address], [place], [place] [address]    Culture   Final    NO GROWTH 2 DAYS Performed at [facility], [address], [place], [place] [address]    Report Status PENDING  Incomplete  Resp panel by RT-PCR (RSV, Flu A&B, Covid) Anterior Nasal Swab     Status: None   Collection Time: 07/24/22  8:16 PM   Specimen: Anterior Nasal Swab  Result Value Ref Range Status   SARS Coronavirus 2 by RT PCR NEGATIVE NEGATIVE Final    Comment: (NOTE) SARS-CoV-2 target nucleic acids are NOT DETECTED.  The SARS-CoV-2 RNA is generally detectable in upper respiratory specimens during the acute phase of infection. The lowest concentration of SARS-CoV-2 viral copies this assay can detect is 138  copies/mL. A negative result does not preclude SARS-Cov-2 infection and should not be used as the sole basis for treatment or other patient management decisions. A negative result may occur with  improper specimen collection/handling, submission of specimen other than nasopharyngeal swab, presence of viral mutation(s) within the areas targeted by this assay, and inadequate number of viral copies(<138 copies/mL). A negative result must be combined with clinical observations, patient history, and epidemiological information. The expected result is Negative.  Fact Sheet for Patients:  BloggerCourse.com  Fact Sheet for Healthcare Providers:  SeriousBroker.it  This test is no t yet approved or cleared by the [place] FDA and  has been authorized for detection and/or diagnosis of SARS-CoV-2 by FDA under an Emergency Use Authorization (EUA). This EUA will remain  in effect (meaning this test can be used) for the duration of the COVID-19 declaration under Section 564(b)(1) of the Act, 21 U.S.C.section 360bbb-3(b)(1), unless the authorization is terminated  or revoked sooner.       Influenza A by PCR NEGATIVE NEGATIVE Final   Influenza B by PCR NEGATIVE NEGATIVE Final    Comment: (NOTE) The Xpert Xpress SARS-CoV-2/FLU/RSV plus assay is intended as an aid in the diagnosis of influenza from Nasopharyngeal swab specimens and should not be used as a sole basis for treatment. Nasal washings and aspirates are unacceptable for Xpert Xpress SARS-CoV-2/FLU/RSV testing.  Fact Sheet for Patients: BloggerCourse.com  Fact Sheet for Healthcare Providers: SeriousBroker.it  This test is not yet approved or cleared by the [place] FDA and has been authorized for detection and/or diagnosis of SARS-CoV-2 by FDA under an Emergency Use Authorization (EUA). This EUA will remain in effect (meaning  this test can be used) for the duration of the COVID-19 declaration under Section 564(b)(1) of the Act, 21 U.S.C. section 360bbb-3(b)(1), unless the authorization is terminated or revoked.     Resp Syncytial Virus by PCR NEGATIVE NEGATIVE Final    Comment: (NOTE) Fact Sheet for Patients: BloggerCourse.com  Fact Sheet for Healthcare Providers: SeriousBroker.it  This test is not yet approved or cleared by the [place] and  has been authorized for detection and/or diagnosis of SARS-CoV-2 by FDA under an Emergency Use Authorization (EUA). This EUA will remain in effect (meaning this test can be used) for the duration of the COVID-19 declaration under Section 564(b)(1) of the Act, 21 U.S.C. section 360bbb-3(b)(1), unless the authorization is terminated or revoked.  Performed at [facility], [address], [place], [place] [address]   Culture, blood (Routine x 2)     Status: None (Preliminary result)   Collection Time: 07/24/22  8:16 PM   Specimen: BLOOD  Result Value Ref Range Status   Specimen Description   Final    BLOOD LEFT ANTECUBITAL Performed at [facility], [address] [place], [place] [address]    Special Requests   Final    BOTTLES DRAWN AEROBIC AND ANAEROBIC Blood Culture adequate volume Performed at [facility], [address], [place], [place] [address]    Culture   Final    NO GROWTH 2 DAYS Performed at [facility], [address], [place], [place] [address]    Report Status PENDING  Incomplete     Radiology Studies: ECHOCARDIOGRAM COMPLETE  Result Date: 07/25/2022    ECHOCARDIOGRAM REPORT   Patient Name:   ERICKA ANTOSH Date of Exam: 07/25/2022 Medical Rec #:  [id number]        Height:       66.0 in Accession #:    [id number]       Weight:       169.3 lb Date of Birth:  Oct 16, 1935        BSA:          1.863 m Patient Age:    86 years          BP:           118/68 mmHg Patient Gender: M                HR:           75 bpm. Exam Location:  Inpatient Procedure: 2D Echo, Color Doppler and Cardiac Doppler Indications:    Elevated Troponin  History:        Patient has prior history of Echocardiogram examinations, most                 recent 12/01/2016. CAD, Prior CABG, Aortic Valve Disease, Mitral                 Valve Disease and s/p AVR w/ 25mm CE Magna and MV Repair w/ 28mm                 Simulus Ring 08/2010; Risk Factors:Hypertension, Diabetes and                 Dyslipidemia. Sepsis, Acute Hypoxic Respiratory Failure,                 Elevated Troponin.                  Mitral Valve: 28 mm prosthetic annuloplasty ring valve is                 present in the mitral position. Procedure Date: 08/2010.  Sonographer:    Milbert Coulter Referring Phys: [id number] JUSTIN B HOWERTER IMPRESSIONS  1. Left ventricular ejection fraction, by estimation, is 55 to 60%. The left ventricle has normal function. The left ventricle has no regional wall motion abnormalities. Left ventricular diastolic function could not  be evaluated.  2. Right ventricular systolic function is mildly reduced. The right ventricular size is moderately enlarged. There is moderately elevated pulmonary artery systolic pressure. The estimated right ventricular systolic pressure is 54.3 mmHg.  3. Left atrial size was mildly dilated.  4. Right atrial size was mildly dilated.  5. 26 mm annuloplasty ring. MG 9 mm HG @ 77 bpm. Higher than reported at [facility] 06/05/2022 (4 mmHG @ 69 bpm). Suspect a mild element of stenosis with the small annuloplasty ring with higher gradient on this study explained by higher heart rate/sepsis/anemia. Would recommend repeat echo after acute illness. The mitral valve has been repaired/replaced. No evidence of mitral valve regurgitation. Mild mitral stenosis. The mean mitral valve gradient is 9.0 mmHg with average heart rate of 77 bpm. There is a 28 mm prosthetic annuloplasty  ring present in the mitral position. Procedure Date: 08/2010.  6. Tricuspid valve regurgitation is mild to moderate.  7. 25 mm CE bioprosthetic aortic valve replacement. Vmax 2.6 m/s, MG 17 mmHG, EOA 1.56 cm2. MG 8 mmHG at [facility] (06/05/2022). Gradients could be higher due to sepsis/anemia. Would recommend repeat echo after acute illness. The aortic valve has been repaired/replaced. Aortic valve regurgitation is not visualized. Procedure Date: 08/2010. Aortic valve area, by VTI measures 1.56 cm. Aortic valve mean gradient measures 17.0 mmHg. Aortic valve Vmax measures 2.61 m/s.  8. The inferior vena cava is normal in size with greater than 50% respiratory variability, suggesting right atrial pressure of 3 mmHg. Comparison(s): Prior images unable to be directly viewed, comparison made by report only. Changes from prior study are noted. Gradients higher across Aoritc prosthesis and MV repair compared with [facility] study 06/05/2022. LV function stable. RV function reduced and moderately dilated RV chamber compared to what was reported at [facility]. FINDINGS  Left Ventricle: Left ventricular ejection fraction, by estimation, is 55 to 60%. The left ventricle has normal function. The left ventricle has no regional wall motion abnormalities. The left ventricular internal cavity size was normal in size. There is  no left ventricular hypertrophy. Abnormal (paradoxical) septal motion consistent with post-operative status. Left ventricular diastolic function could not be evaluated due to mitral valve repair. Left ventricular diastolic function could not be evaluated. Right Ventricle: The right ventricular size is moderately enlarged. No increase in right ventricular wall thickness. Right ventricular systolic function is mildly reduced. There is moderately elevated pulmonary artery systolic pressure. The tricuspid regurgitant velocity is 3.58 m/s, and with an assumed right atrial pressure of 3 mmHg, the estimated right ventricular systolic  pressure is 54.3 mmHg. Left Atrium: Left atrial size was mildly dilated. Right Atrium: Right atrial size was mildly dilated. Pericardium: There is no evidence of pericardial effusion. Mitral Valve: 26 mm annuloplasty ring. MG 9 mm HG @ 77 bpm. Higher than reported at [facility] 06/05/2022 (4 mmHG @ 69 bpm). Suspect a mild element of stenosis with the small annuloplasty ring with higher gradient on this study explained by higher heart rate/sepsis/anemia. Would recommend repeat echo after acute illness. The mitral valve has been repaired/replaced. No evidence of mitral valve regurgitation. There is a 28 mm prosthetic annuloplasty ring present in the mitral position. Procedure Date: 08/2010. Mild mitral valve stenosis. MV peak gradient, 22.5 mmHg. The mean mitral valve gradient is 9.0 mmHg with average heart rate of 77 bpm. Tricuspid Valve: The tricuspid valve is grossly normal. Tricuspid valve regurgitation is mild to moderate. No evidence of tricuspid stenosis. Aortic Valve: 25 mm CE bioprosthetic aortic valve replacement. Vmax 2.6  m/s, MG 17 mmHG, EOA 1.56 cm2. MG 8 mmHG at [facility] (06/05/2022). Gradients could be higher due to sepsis/anemia. Would recommend repeat echo after acute illness. The aortic valve has been repaired/replaced. Aortic valve regurgitation is not visualized. Aortic valve mean gradient measures 17.0 mmHg. Aortic valve peak gradient measures 27.2 mmHg. Aortic valve area, by VTI measures 1.56 cm. There is a 25 mm valve present in the aortic position. Pulmonic Valve: The pulmonic valve was grossly normal. Pulmonic valve regurgitation is mild to moderate. No evidence of pulmonic stenosis. Aorta: The aortic root and ascending aorta are structurally normal, with no evidence of dilitation. Venous: The inferior vena cava is normal in size with greater than 50% respiratory variability, suggesting right atrial pressure of 3 mmHg. IAS/Shunts: The atrial septum is grossly normal.  LEFT VENTRICLE PLAX 2D LVIDd:          5.20 cm     Diastology LVIDs:         4.00 cm     LV e' medial:    5.33 cm/s LV PW:         0.90 cm     LV E/e' medial:  31.9 LV IVS:        1.10 cm     LV e' lateral:   11.20 cm/s LVOT diam:     2.24 cm     LV E/e' lateral: 15.2 LV SV:         83 LV SV Index:   45 LVOT Area:     3.94 cm  LV Volumes (MOD) LV vol d, MOD A2C: 78.2 ml LV vol d, MOD A4C: 74.6 ml LV vol s, MOD A2C: 30.4 ml LV vol s, MOD A4C: 33.3 ml LV SV MOD A2C:     47.8 ml LV SV MOD A4C:     74.6 ml LV SV MOD BP:      44.6 ml RIGHT VENTRICLE RV Basal diam:  3.60 cm RV Mid diam:    2.80 cm RV S prime:     10.90 cm/s TAPSE (M-mode): 1.4 cm LEFT ATRIUM             Index        RIGHT ATRIUM           Index LA diam:        4.60 cm 2.47 cm/m   RA Area:     19.60 cm LA Vol (A2C):   71.0 ml 38.11 ml/m  RA Volume:   59.50 ml  31.94 ml/m LA Vol (A4C):   57.6 ml 30.92 ml/m LA Biplane Vol: 65.7 ml 35.26 ml/m  AORTIC VALVE AV Area (Vmax):    1.31 cm AV Area (Vmean):   1.33 cm AV Area (VTI):     1.56 cm AV Vmax:           261.00 cm/s AV Vmean:          192.000 cm/s AV VTI:            0.532 m AV Peak Grad:      27.2 mmHg AV Mean Grad:      17.0 mmHg LVOT Vmax:         86.50 cm/s LVOT Vmean:        64.700 cm/s LVOT VTI:          0.211 m LVOT/AV VTI ratio: 0.40 MITRAL VALVE                TRICUSPID VALVE MV Area (  PHT): 2.62 cm     TR Peak grad:   51.3 mmHg MV Area VTI:   1.15 cm     TR Vmax:        358.00 cm/s MV Peak grad:  22.5 mmHg MV Mean grad:  9.0 mmHg     SHUNTS MV Vmax:       2.37 m/s     Systemic VTI:  0.21 m MV Vmean:      141.0 cm/s   Systemic Diam: 2.24 cm MV VTI:        0.72 m MV Decel Time: 290 msec MV E velocity: 170.00 cm/s MV A velocity: 98.50 cm/s MV E/A ratio:  1.73 Lennie Odor MD Electronically signed by Lennie Odor MD Signature Date/Time: 07/25/2022/11:00:12 AM    Final    CT Head Wo Contrast  Result Date: 07/24/2022 CLINICAL DATA:  Mental status change, fall. EXAM: CT HEAD WITHOUT CONTRAST TECHNIQUE: Contiguous axial images were  obtained from the base of the skull through the vertex without intravenous contrast. RADIATION DOSE REDUCTION: This exam was performed according to the departmental dose-optimization program which includes automated exposure control, adjustment of the mA and/or kV according to patient size and/or use of iterative reconstruction technique. COMPARISON:  Head CT 06/16/2003.  MRI brain 04/12/2016. FINDINGS: Brain: No evidence of acute infarction, hemorrhage, hydrocephalus, extra-axial collection or mass lesion/mass effect. 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 Mild diffuse atrophy is unchanged. Vascular: Atherosclerotic calcifications are present within the cavernous internal carotid arteries. Skull: Normal. Negative for fracture or focal lesion. Sinuses/Orbits: No acute finding. Other: None. IMPRESSION: 1. No acute intracranial abnormality. 2. 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 3. Mild diffuse atrophy is unchanged. Electronically Signed   By: Darliss Cheney M.D.   On: 07/24/2022 21:15   DG Chest 2 View  Result Date: 07/24/2022 CLINICAL DATA:  Suspected sepsis, fall. EXAM: CHEST - 2 VIEW COMPARISON:  12/05/2016. FINDINGS: Heart is enlarged and the mediastinal contour is within normal limits. 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 There is atherosclerotic calcification of the aorta. Lung volumes are low and mild interstitial prominence is noted bilaterally. No effusion or pneumothorax. Sternotomy wires and cervical spinal fusion hardware are noted. Degenerative changes are present in the thoracic spine. IMPRESSION: 1. Cardiomegaly. 2. Interstitial prominence bilaterally, which may be accentuated due to low lung volumes. Electronically Signed   By: Thornell Sartorius M.D.   On: 07/24/2022 20:43    Scheduled Meds:  sodium chloride   Intravenous Once   vitamin B-12  1,000 mcg Oral Daily   [START ON 07/27/2022] ferrous sulfate  325 mg Oral Q breakfast    FLUoxetine  20 mg Oral QHS   furosemide  20 mg Oral Daily   insulin aspart  0-9 Units Subcutaneous TID WC   ipratropium-albuterol  3 mL Nebulization BID   metoprolol succinate  50 mg Oral Daily   pantoprazole (PROTONIX) IV  40 mg Intravenous Q12H   polyethylene glycol  17 g Oral Daily   rosuvastatin  10 mg Oral Daily   senna-docusate  1 tablet Oral BID   spironolactone  25 mg Oral Daily   Continuous Infusions:  azithromycin 500 mg (07/25/22 2319)   cefTRIAXone (ROCEPHIN)  IV 2 g (07/26/22 1032)     LOS: 2 days   Burnadette Pop, MD [facility] P5/10/2022, 1:55 PM

## 2022-07-27 ENCOUNTER — Encounter (HOSPITAL_COMMUNITY)
Admission: EM | Disposition: A | Discharge status: Home Health | Payer: Self-pay | Source: Home / Self Care | Attending: Internal Medicine

## 2022-07-27 ENCOUNTER — Inpatient Hospital Stay (HOSPITAL_COMMUNITY): Payer: Medicare Other | Admitting: Anesthesiology

## 2022-07-27 ENCOUNTER — Encounter (HOSPITAL_COMMUNITY): Payer: Self-pay | Admitting: Internal Medicine

## 2022-07-27 DIAGNOSIS — J209 Acute bronchitis, unspecified: Secondary | ICD-10-CM | POA: Diagnosis not present

## 2022-07-27 DIAGNOSIS — K3189 Other diseases of stomach and duodenum: Secondary | ICD-10-CM

## 2022-07-27 DIAGNOSIS — T287XXA Corrosion of other parts of alimentary tract, initial encounter: Secondary | ICD-10-CM

## 2022-07-27 DIAGNOSIS — D500 Iron deficiency anemia secondary to blood loss (chronic): Secondary | ICD-10-CM

## 2022-07-27 DIAGNOSIS — E119 Type 2 diabetes mellitus without complications: Secondary | ICD-10-CM

## 2022-07-27 DIAGNOSIS — T5494XA Toxic effect of unspecified corrosive substance, undetermined, initial encounter: Secondary | ICD-10-CM

## 2022-07-27 DIAGNOSIS — Z7984 Long term (current) use of oral hypoglycemic drugs: Secondary | ICD-10-CM

## 2022-07-27 DIAGNOSIS — K449 Diaphragmatic hernia without obstruction or gangrene: Secondary | ICD-10-CM

## 2022-07-27 DIAGNOSIS — Z794 Long term (current) use of insulin: Secondary | ICD-10-CM

## 2022-07-27 HISTORY — PX: ESOPHAGOGASTRODUODENOSCOPY (EGD) WITH PROPOFOL: SHX5813

## 2022-07-27 LAB — GLUCOSE, CAPILLARY
Glucose-Capillary: 224 mg/dL — ABNORMAL HIGH (ref 70–99)
Glucose-Capillary: 240 mg/dL — ABNORMAL HIGH (ref 70–99)

## 2022-07-27 LAB — BPAM RBC
Blood Product Expiration Date: 202405192359
ISSUE DATE / TIME: 202405081416

## 2022-07-27 LAB — CBC
HCT: 24.2 % — ABNORMAL LOW (ref 39.0–52.0)
Hemoglobin: 7.9 g/dL — ABNORMAL LOW (ref 13.0–17.0)
MCH: 30 pg (ref 26.0–34.0)
MCHC: 32.6 g/dL (ref 30.0–36.0)
MCV: 92 fL (ref 80.0–100.0)
Platelets: 250 10*3/uL (ref 150–400)
RBC: 2.63 MIL/uL — ABNORMAL LOW (ref 4.22–5.81)
RDW: 15.6 % — ABNORMAL HIGH (ref 11.5–15.5)
WBC: 12.1 10*3/uL — ABNORMAL HIGH (ref 4.0–10.5)
nRBC: 0.3 % — ABNORMAL HIGH (ref 0.0–0.2)

## 2022-07-27 LAB — BASIC METABOLIC PANEL
Anion gap: 9 (ref 5–15)
BUN: 14 mg/dL (ref 8–23)
CO2: 21 mmol/L — ABNORMAL LOW (ref 22–32)
Calcium: 8.6 mg/dL — ABNORMAL LOW (ref 8.9–10.3)
Chloride: 105 mmol/L (ref 98–111)
Creatinine, Ser: 1.2 mg/dL (ref 0.61–1.24)
GFR, Estimated: 60 mL/min — ABNORMAL LOW (ref >60)
Glucose, Bld: 225 mg/dL — ABNORMAL HIGH (ref 70–99)
Potassium: 3.5 mmol/L (ref 3.5–5.1)
Sodium: 135 mmol/L (ref 135–145)

## 2022-07-27 LAB — TYPE AND SCREEN
ABO/RH(D): A POS
Unit division: 0

## 2022-07-27 LAB — CULTURE, BLOOD (ROUTINE X 2): Special Requests: ADEQUATE

## 2022-07-27 SURGERY — ESOPHAGOGASTRODUODENOSCOPY (EGD) WITH PROPOFOL
Anesthesia: Monitor Anesthesia Care

## 2022-07-27 MED ORDER — LACTATED RINGERS IV SOLN
INTRAVENOUS | Status: DC
  Administered 2022-07-27: 1000 mL via INTRAVENOUS

## 2022-07-27 MED ORDER — AMOXICILLIN-POT CLAVULANATE 875-125 MG PO TABS: 1.0000 | ORAL_TABLET | Freq: Two times a day (BID) | ORAL | Status: DC

## 2022-07-27 MED ORDER — FERROUS SULFATE 325 (65 FE) MG PO TABS: 325.0000 mg | ORAL_TABLET | Freq: Every day | ORAL | 3 refills | Status: DC

## 2022-07-27 MED ORDER — PROPOFOL 500 MG/50ML IV EMUL
INTRAVENOUS | Status: DC | PRN
  Administered 2022-07-27: 110 ug/kg/min via INTRAVENOUS

## 2022-07-27 MED ORDER — CYANOCOBALAMIN 1000 MCG PO TABS: 1000.0000 ug | ORAL_TABLET | Freq: Every day | ORAL | 1 refills | Status: DC

## 2022-07-27 MED ORDER — LIDOCAINE HCL 1 % IJ SOLN
INTRAMUSCULAR | Status: DC | PRN
  Administered 2022-07-27: 50 mg via INTRADERMAL

## 2022-07-27 MED ORDER — PROPOFOL 10 MG/ML IV BOLUS
INTRAVENOUS | Status: DC | PRN
  Administered 2022-07-27: 10 mg via INTRAVENOUS

## 2022-07-27 MED ORDER — AMOXICILLIN-POT CLAVULANATE 875-125 MG PO TABS: 1.0000 | ORAL_TABLET | Freq: Two times a day (BID) | ORAL | 0 refills | Status: AC

## 2022-07-27 MED ORDER — PANTOPRAZOLE SODIUM 40 MG PO TBEC: 40.0000 mg | DELAYED_RELEASE_TABLET | Freq: Every day | ORAL | 1 refills | Status: DC

## 2022-07-27 SURGICAL SUPPLY — 15 items

## 2022-07-27 NOTE — Anesthesia Preprocedure Evaluation (Addendum)
Anesthesia Evaluation  Patient identified by MRN, date of birth, ID band Patient awake    Reviewed: Allergy & Precautions, NPO status , Patient's Chart, lab work & pertinent test results  Airway Mallampati: II  TM Distance: >3 FB Neck ROM: Full    Dental  (+) Chipped, Missing   Pulmonary sleep apnea , former smoker   Pulmonary exam normal        Cardiovascular hypertension, Pt. on medications and Pt. on home beta blockers + CAD, + Cardiac Stents, + CABG and + Peripheral Vascular Disease  Normal cardiovascular exam     Neuro/Psych  PSYCHIATRIC DISORDERS  Depression     Neuromuscular disease CVA    GI/Hepatic Neg liver ROS,GERD  Medicated and Controlled,,  Endo/Other  diabetes, Insulin Dependent, Oral Hypoglycemic Agents    Renal/GU Renal disease     Musculoskeletal  (+) Arthritis ,  Ambulates with cane   Abdominal   Peds  Hematology  (+) Blood dyscrasia, anemia   Anesthesia Other Findings Anemia guaiac positivity  Reproductive/Obstetrics                             Anesthesia Physical Anesthesia Plan  ASA: 3  Anesthesia Plan: MAC   Post-op Pain Management:    Induction: Intravenous  PONV Risk Score and Plan: 1 and Propofol infusion and Treatment may vary due to age or medical condition  Airway Management Planned: Nasal Cannula  Additional Equipment:   Intra-op Plan:   Post-operative Plan:   Informed Consent: I have reviewed the patients History and Physical, chart, labs and discussed the procedure including the risks, benefits and alternatives for the proposed anesthesia with the patient or authorized representative who has indicated his/her understanding and acceptance.     Dental advisory given  Plan Discussed with: CRNA  Anesthesia Plan Comments:        Anesthesia Quick Evaluation

## 2022-07-27 NOTE — Inpatient Diabetes Management (Signed)
Inpatient Diabetes Program Recommendations  AACE/ADA: New Consensus Statement on Inpatient Glycemic Control (2015)  Target Ranges:  Prepandial:   less than 140 mg/dL      Peak postprandial:   less than 180 mg/dL (1-2 hours)      Critically ill patients:  140 - 180 mg/dL   Lab Results  Component Value Date   GLUCAP 224 (H) 07/27/2022   HGBA1C 7.0 (H) 07/25/2022    Review of Glycemic Control  Latest Reference Range & Units 07/26/22 12:11 07/26/22 17:04 07/26/22 20:31 07/27/22 07:40  Glucose-Capillary 70 - 99 mg/dL 161 (H) 096 (H) 045 (H) 224 (H)  (H): Data is abnormally high Diabetes history: Type 2 DM Outpatient Diabetes medications: Humalog 75/25 18 units BID, Metformin 1000 mg BID Current orders for Inpatient glycemic control:Novolog 0-9 units TID   Inpatient Diabetes Program Recommendations:     Consider adding: -Semglee 8 units Qd -changing diet to carb modified when diet advances  Thanks, Lujean Rave, MSN, RNC-OB Diabetes Coordinator [phone] (8a-5p)

## 2022-07-27 NOTE — Progress Notes (Signed)
Physical Therapy Treatment Patient Details Name: [name] MRN: [id number] DOB: July 16, 1935 Today's Date: 07/27/2022   History of Present Illness Patient is a 87 year old male who presented on 5/6 with generalized weakness and fall in ED pt was found to be tin tachycardia, hypoxic with elevated tempeture. Patient was admitted with sepsis secondary to acute bronchitis. Pt head CT negative for acute findings and HgB of 7.1. PMH including but not limited to:  DM II, depression, L shoulder surgery, prostate cancer, spinal stenosis, stroke, a flutter abliation, cervical disc surgery.    PT Comments    Patient resting in bed at start of session, and agreeable to mobilize. Supervision for bed mobility and min guard for safety with sit<>stand using RW. Pt reporting urge to void on standing and ambulated to bathroom with min guard and cues for walker position. Pt completed toileting tasks with supervision, no LOB. Ambulated to hallway and transport arrived to take pt for procedure. Pt completed ambulatory transfer to transport [facility], will continue to progress as able.   Recommendations for follow up therapy are one component of a multi-disciplinary discharge planning process, led by the attending physician.  Recommendations may be updated based on patient status, additional functional criteria and insurance authorization.  Follow Up Recommendations       Assistance Recommended at Discharge Intermittent Supervision/Assistance  Patient can return home with the following A little help with walking and/or transfers;A little help with bathing/dressing/bathroom;Assistance with cooking/housework;Assist for transportation;Help with stairs or ramp for entrance   Equipment Recommendations  None recommended by PT (pt reports DME in home setting, wife is inquiring per use of rollator)    Recommendations for Other Services       Precautions / Restrictions Precautions Precautions: Fall Restrictions Weight  Bearing Restrictions: No     Mobility  Bed Mobility Overal bed mobility: Needs Assistance Bed Mobility: Supine to Sit     Supine to sit: Supervision, HOB elevated     General bed mobility comments: sup for supine>sit, pt using bed features    Transfers Overall transfer level: Needs assistance Equipment used: Rolling walker (2 wheels) Transfers: Sit to/from Stand Sit to Stand: Min guard           General transfer comment: guard for safety with power up from EOB and toilet    Ambulation/Gait Ambulation/Gait assistance: Min guard Gait Distance (Feet): 30 Feet Assistive device: Rolling walker (2 wheels) Gait Pattern/deviations: Step-through pattern, Trunk flexed Gait velocity: decreased     General Gait Details: cues for proximity to RW, no LOB or buckling noted. pt amb to bathroom and out to hallway with min guard. in hallway transport arrived for pt to go to procedure, pt sequenced turn to sit in [facility]   Stairs             Wheelchair Mobility    Modified Rankin (Stroke Patients Only)       Balance Overall balance assessment: History of Falls, Needs assistance Sitting-balance support: Feet supported Sitting balance-Leahy Scale: Good     Standing balance support: Bilateral upper extremity supported, During functional activity, Reliant on assistive device for balance Standing balance-Leahy Scale: Poor                              Cognition Arousal/Alertness: Awake/alert Behavior During Therapy: WFL for tasks assessed/performed Overall Cognitive Status: Within Functional Limits for tasks assessed  Exercises      General Comments        Pertinent Vitals/Pain Pain Assessment Pain Assessment: 0-10 Pain Location: head and neck Pain Descriptors / Indicators: Aching, Constant, Headache Pain Intervention(s): Limited activity within patient's tolerance, Monitored during session,  Repositioned    Home Living                          Prior Function            PT Goals (current goals can now be found in the care plan section) Acute Rehab PT Goals Patient Stated Goal: decrease falls, retire PT Goal Formulation: With patient/family Time For Goal Achievement: 08/08/22 Potential to Achieve Goals: Good Progress towards PT goals: Progressing toward goals    Frequency    Min 1X/week      PT Plan Current plan remains appropriate    Co-evaluation              AM-PAC PT "6 Clicks" Mobility   Outcome Measure  Help needed turning from your back to your side while in a flat bed without using bedrails?: None Help needed moving from lying on your back to sitting on the side of a flat bed without using bedrails?: A Little Help needed moving to and from a bed to a chair (including a wheelchair)?: A Little Help needed standing up from a chair using your arms (e.g., wheelchair or bedside chair)?: A Little Help needed to walk in hospital room?: A Little Help needed climbing 3-5 steps with a railing? : A Lot 6 Click Score: 18    End of Session Equipment Utilized During Treatment: Gait belt Activity Tolerance: Patient tolerated treatment well Patient left: Other (comment) (transport for EGD/procedure) Nurse Communication: Mobility status PT Visit Diagnosis: Unsteadiness on feet (R26.81);Other abnormalities of gait and mobility (R26.89);Repeated falls (R29.6);Muscle weakness (generalized) (M62.81);Pain Pain - part of body:  (neck and head secondary to fall)     Time: 1478-2956 PT Time Calculation (min) (ACUTE ONLY): 14 min  Charges:  $Therapeutic Activity: 8-22 mins                     Wynn Maudlin, DPT Acute Rehabilitation Services Office [phone]  07/27/22 12:57 PM

## 2022-07-27 NOTE — Transfer of Care (Signed)
Immediate Anesthesia Transfer of Care Note  Patient: [name]  Procedure(s) Performed: ESOPHAGOGASTRODUODENOSCOPY (EGD) WITH PROPOFOL  Patient Location: PACU and Endoscopy Unit  Anesthesia Type:MAC  Level of Consciousness: oriented, drowsy, and patient cooperative  Airway & Oxygen Therapy: Patient Spontanous Breathing and Patient connected to face mask oxygen  Post-op Assessment: Report given to RN and Post -op Vital signs reviewed and stable  Post vital signs: Reviewed and stable  Last Vitals:  Vitals Value Taken Time  BP    Temp    Pulse 69 07/27/22 1137  Resp 22 07/27/22 1137  SpO2 100 % 07/27/22 1137  Vitals shown include unvalidated device data.  Last Pain:  Vitals:   07/27/22 1015  TempSrc: Temporal  PainSc: 0-No pain      Patients Stated Pain Goal: 0 (07/25/22 0925)  Complications: No notable events documented.

## 2022-07-27 NOTE — Op Note (Signed)
[facility] Patient Name: [name] Procedure Date: 07/27/2022 MRN: [id number] Attending MD: Vida Rigger , MD, [id number] Date of Birth: [birth year] CSN: [id number] Age: 87 Admit Type: Inpatient Procedure:                Upper GI endoscopy Indications:              Iron deficiency anemia secondary to chronic blood                            loss Providers:                Vida Rigger, MD, Stephens Shire RN, RN, Kandice Robinsons, Technician Referring MD:              Medicines:                Monitored Anesthesia Care Complications:            No immediate complications. Estimated Blood Loss:     Estimated blood loss: none. Procedure:                Pre-Anesthesia Assessment:                           - Prior to the procedure, a History and Physical                            was performed, and patient medications and                            allergies were reviewed. The patient's tolerance of                            previous anesthesia was also reviewed. The risks                            and benefits of the procedure and the sedation                            options and risks were discussed with the patient.                            All questions were answered, and informed consent                            was obtained. Prior Anticoagulants: The patient has                            taken no anticoagulant or antiplatelet agents                            except for aspirin. ASA Grade Assessment: III - A  patient with severe systemic disease. After                            reviewing the risks and benefits, the patient was                            deemed in satisfactory condition to undergo the                            procedure.                           After obtaining informed consent, the endoscope was                            passed under direct vision. Throughout the                             procedure, the patient's blood pressure, pulse, and                            oxygen saturations were monitored continuously. The                            [id number]-[id number] ([id number] Olympus endoscope was introduced                            through the mouth, and advanced to the third part                            of duodenum. The upper GI endoscopy was                            accomplished without difficulty. The patient                            tolerated the procedure well. Scope In: Scope Out: Findings:      A Tiny hiatal hernia was present.      A few localized small erosions with no bleeding and no stigmata of       recent bleeding were found in the gastric antrum.      Localized moderate inflammation characterized by congestion (edema),       erythema, granularity and mucus was found on the greater curvature of       the stomach.      The duodenal bulb, first portion of the duodenum, second portion of the       duodenum and third portion of the duodenum were normal.      The exam was otherwise without abnormality. Impression:               - Tiny hiatal hernia.                           - Erosive gastropathy with no bleeding and no  stigmata of recent bleeding.                           - Caustic gastritis.                           - Normal duodenal bulb, first portion of the                            duodenum, second portion of the duodenum and third                            portion of the duodenum.                           - The examination was otherwise normal.                           - No specimens collected. Moderate Sedation:      Not Applicable - Patient had care per Anesthesia. Recommendation:           - Soft diet today.                           - Continue present medications. Reevaluate aspirin                            needs possibly decrease to a baby every day and use                            40 mg of Protonix long-term to  see if that makes a                            difference with his anemia                           - Return to GI clinic PRN. Consider a abdominal                            pelvic CT with contrast just to rule out anything                            significant but okay with me to hold colonoscopy                            for now based on other medical issues                           - Telephone GI clinic if symptomatic PRN. To see                            back in office if further evaluation is needed or  GI question or problem and please call us if we                            could be of any further assistance with this                            hospital stay Procedure Code(s):        --- Professional ---                           [phone], Esophagogastroduodenoscopy, flexible,                            transoral; diagnostic, including collection of                            specimen(s) by brushing or washing, when performed                            (separate procedure) Diagnosis Code(s):        --- Professional ---                           K44.9, Diaphragmatic hernia without obstruction or                            gangrene                           K31.89, Other diseases of stomach and duodenum                           T54.94XA, Toxic effect of unspecified corrosive                            substance, undetermined, initial encounter                           T28.7XXA, Corrosion of other parts of alimentary                            tract, initial encounter                           D50.0, Iron deficiency anemia secondary to blood                            loss (chronic) CPT copyright 2022 [employer]. All rights reserved. The codes documented in this report are preliminary and upon coder review may  be revised to meet current compliance requirements. Vida Rigger, MD 07/27/2022 11:48:57 AM This report has been signed  electronically. Number of Addenda: 0

## 2022-07-27 NOTE — Care Management Important Message (Signed)
Important Message  Patient Details IM Letter given Name: [name] MRN: [id number] Date of Birth: 03/04/1936   Medicare Important Message Given:  Yes     Caren Macadam 07/27/2022, 8:53 AM

## 2022-07-27 NOTE — Discharge Summary (Signed)
Physician Discharge Summary  [name] GNF:[id number] DOB: March 23, 1935 DOA: 07/24/2022  PCP: Renford Dills, MD  Admit date: 07/24/2022 Discharge date: 07/27/2022  Admitted From: Home Disposition:  Home  Discharge Condition:Stable CODE STATUS:FULL Diet recommendation: soft diet for next 1-2 days  Brief/Interim Summary: Patient is a 87 year old male with history of coronary artery  disease status post CABG, hypertension, hyperlipidemia, type 2 diabetes, anemia of chronic disease with a baseline hemoglobin in the range of 9-10 who presented to the emergency department with complaint of generalized weakness, nonproductive cough, mild shortness of breath.  On presentation, he was febrile, blood pressure stable, saturating 89% on room air and was put on supplemental oxygen at 2 L/min.  Lab work  showed creatinine of 1.2, BNP of 282, mildly elevated troponin with flat trend, white cell count of 11.6, hemoglobin of 8.3.  Blood cultures was collected in the ED.  Chest x-ray showed bilateral interstitial prominence, cardiomegaly.  Patient was suspected to have acute bronchitis and was admitted, started on antibiotics.  Hospital course remarkable for anemia, hemoglobin dropped to the range of 6, FOBT positive. GI consulted.  Underwent EGD today with finding of erosive gastropathy, gastritis, no active bleeding.  Medically stable for discharge home today with Protonix.  We recommend to follow-up with PCP and GI as an outpatient  Following problems were addressed during the hospitalization:  Severe sepsis secondary to bronchitis/acute hypoxic respiratory failure: Presented  with 2 days history of cough, shortness of breath, fever.  CXR did not show  any clear pneumonia.  Procalcitonin elevated at 1.8 COVID/flu/influenza negative.  Elevated lactate level, leukocytes on presentation which have resolved.    Given a dose of vancomycin and cefepime in the emergency room.  Changed  to azithromycin, ceftriaxone then  subsequently to Augmentin. Respiratory status significantly improved.  This morning he is on room air.   Acute on chronic normocytic anemia: Baseline hemoglobin ranges from 9-11.   No report of hematochezia or melena. Iron panel showed severe iron deficiency.  Given  IV iron.  Relatively low vitamin B12 of 201.  Started supplementation.  Folic acid level normal. Hemoglobin dropped to 6.  FOBT positive.  Started on Protonix IV twice daily.  GI consulted for consideration of EGD/coloscopy.  Underwent EGD with finding of erosive gastropathy, gastritis.  GI recommended daily Protonix,outpatient follow-up.  He may need colonoscopy as an outpatient  Elevated BNP/troponin: Denies any chest pain.  Troponin is elevated with flat trend likely from demand ischemia.  Elevated BNP on presentation.  Echo showed EF of 55 to 60%, no wall motion abnormality, left ventricular diastolic function could not be evaluated.  He is on as needed Lasix at home.  Appears euvolemic this morning.  He has history of mitral valve/atrial valve replacement.  Follows with [facility]   Hypertension: Takes losartan, metoprolol at home.  Currently blood stable.    Hyperlipidemia: On Crestor   Type 2 diabetes: Takes insulin, metformin at home.  A1c of 7    Depression: On Prozac.   Generalized weakness/deconditioning: 2 days history of onset of weakness, no focal neurological deficits.  PT/OT consulted.  Recommend home health .ambulates with the help of walker at home, has fallen off on recently.  CT head did not show any acute findings.     Discharge Diagnoses:  Principal Problem:   Acute bronchitis Active Problems:   Essential hypertension   DM2 (diabetes mellitus, type 2) (HCC)   Severe sepsis (HCC)   Generalized weakness   Acute hypoxic  respiratory failure (HCC)   Elevated troponin   Acute on chronic anemia   Hyperlipidemia   Depression    Discharge Instructions  Discharge Instructions     Diet - low sodium heart  healthy   Complete by: As directed    Take soft diet for next 1-2 days   Discharge instructions   Complete by: As directed    1)Please take prescribed medications as instructed 2)Follow up with your PCP in a week.  Do a CBC test to check your hemoglobin during the follow-up. 3)Follow up with gastroenterology as an outpatient in 2 weeks. Number and number of the provider has been attached.  Call for appointment   Increase activity slowly   Complete by: As directed       Allergies as of 07/27/2022   No Known Allergies      Medication List     STOP taking these medications    omeprazole 20 MG capsule Commonly known as: PRILOSEC       TAKE these medications    amoxicillin-clavulanate 875-125 MG tablet Commonly known as: AUGMENTIN Take 1 tablet by mouth every 12 (twelve) hours for 3 days.   cyanocobalamin 1000 MCG tablet Take 1 tablet (1,000 mcg total) by mouth daily. Start taking on: Jul 28, 2022   ferrous sulfate 325 (65 FE) MG tablet Take 1 tablet (325 mg total) by mouth daily with breakfast. Start taking on: Jul 28, 2022   FLUoxetine 20 MG capsule Commonly known as: PROZAC Take 20 mg by mouth at bedtime.   furosemide 20 MG tablet Commonly known as: LASIX Take 1 tablet (20 mg total) by mouth daily as needed for fluid.   gabapentin 600 MG tablet Commonly known as: NEURONTIN Take 600 mg by mouth 3 (three) times daily.   insulin lispro protamine-lispro (75-25) 100 UNIT/ML Susp injection Commonly known as: HUMALOG 75/25 MIX Inject 18 Units into the skin 2 (two) times daily with a meal.   losartan 25 MG tablet Commonly known as: COZAAR Take 1 tablet by mouth once daily   melatonin 3 MG Tabs tablet Take 1 tablet by mouth at bedtime.   metFORMIN 1000 MG (MOD) 24 hr tablet Commonly known as: GLUMETZA Take 1 tablet (1,000 mg total) by mouth 2 (two) times daily with a meal.   metoprolol succinate 50 MG 24 hr tablet Commonly known as: TOPROL-XL Take 1 tablet  (50 mg total) by mouth daily.   multivitamin tablet Take 1 tablet by mouth daily.   nitroGLYCERIN 0.4 MG SL tablet Commonly known as: NITROSTAT Place 1 tablet (0.4 mg total) under the tongue every 5 (five) minutes as needed. For chest pain   pantoprazole 40 MG tablet Commonly known as: Protonix Take 1 tablet (40 mg total) by mouth daily.   potassium chloride SA 20 MEQ tablet Commonly known as: KLOR-CON M Take 1 tablet (20 mEq total) by mouth daily.   rosuvastatin 10 MG tablet Commonly known as: CRESTOR Take 10 mg by mouth every morning.   spironolactone 25 MG tablet Commonly known as: ALDACTONE Take 25 mg by mouth daily.   zolpidem 10 MG tablet Commonly known as: AMBIEN Take 5 mg by mouth at bedtime.        Follow-up Information     Renford Dills, MD. Schedule an appointment as soon as possible for a visit in 1 week(s).   Specialty: Internal Medicine Contact information: [address] [place] [address] [phone]  Vida Rigger, MD. Schedule an appointment as soon as possible for a visit in 2 week(s).   Specialty: Gastroenterology Contact information: [address] [place] [address] [phone]                No Known Allergies  Consultations: GI   Procedures/Studies: ECHOCARDIOGRAM COMPLETE  Result Date: 07/25/2022    ECHOCARDIOGRAM REPORT   Patient Name:   [name] Date of Exam: 07/25/2022 Medical Rec #:  [id number]        Height:       66.0 in Accession #:    [id number]       Weight:       169.3 lb Date of Birth:  [birth year]        BSA:          1.863 m Patient Age:    86 years         BP:           118/68 mmHg Patient Gender: M                HR:           75 bpm. Exam Location:  Inpatient Procedure: 2D Echo, Color Doppler and Cardiac Doppler Indications:    Elevated Troponin  History:        Patient has prior history of Echocardiogram examinations, most                 recent 12/01/2016. CAD,  Prior CABG, Aortic Valve Disease, Mitral                 Valve Disease and s/p AVR w/ 25mm CE Magna and MV Repair w/ 28mm                 Simulus Ring 08/2010; Risk Factors:Hypertension, Diabetes and                 Dyslipidemia. Sepsis, Acute Hypoxic Respiratory Failure,                 Elevated Troponin.                  Mitral Valve: 28 mm prosthetic annuloplasty ring valve is                 present in the mitral position. Procedure Date: 08/2010.  Sonographer:    Milbert Coulter Referring Phys: [id number] JUSTIN B HOWERTER IMPRESSIONS  1. Left ventricular ejection fraction, by estimation, is 55 to 60%. The left ventricle has normal function. The left ventricle has no regional wall motion abnormalities. Left ventricular diastolic function could not be evaluated.  2. Right ventricular systolic function is mildly reduced. The right ventricular size is moderately enlarged. There is moderately elevated pulmonary artery systolic pressure. The estimated right ventricular systolic pressure is 54.3 mmHg.  3. Left atrial size was mildly dilated.  4. Right atrial size was mildly dilated.  5. 26 mm annuloplasty ring. MG 9 mm HG @ 77 bpm. Higher than reported at [facility] 06/05/2022 (4 mmHG @ 69 bpm). Suspect a mild element of stenosis with the small annuloplasty ring with higher gradient on this study explained by higher heart rate/sepsis/anemia. Would recommend repeat echo after acute illness. The mitral valve has been repaired/replaced. No evidence of mitral valve regurgitation. Mild mitral stenosis. The mean mitral valve gradient is 9.0 mmHg with average heart rate of 77 bpm. There is a 28 mm prosthetic annuloplasty ring present in  the mitral position. Procedure Date: 08/2010.  6. Tricuspid valve regurgitation is mild to moderate.  7. 25 mm CE bioprosthetic aortic valve replacement. Vmax 2.6 m/s, MG 17 mmHG, EOA 1.56 cm2. MG 8 mmHG at [facility] (06/05/2022). Gradients could be higher due to sepsis/anemia. Would recommend repeat echo after  acute illness. The aortic valve has been repaired/replaced. Aortic valve regurgitation is not visualized. Procedure Date: 08/2010. Aortic valve area, by VTI measures 1.56 cm. Aortic valve mean gradient measures 17.0 mmHg. Aortic valve Vmax measures 2.61 m/s.  8. The inferior vena cava is normal in size with greater than 50% respiratory variability, suggesting right atrial pressure of 3 mmHg. Comparison(s): Prior images unable to be directly viewed, comparison made by report only. Changes from prior study are noted. Gradients higher across Aoritc prosthesis and MV repair compared with [facility] study 06/05/2022. LV function stable. RV function reduced and moderately dilated RV chamber compared to what was reported at [facility]. FINDINGS  Left Ventricle: Left ventricular ejection fraction, by estimation, is 55 to 60%. The left ventricle has normal function. The left ventricle has no regional wall motion abnormalities. The left ventricular internal cavity size was normal in size. There is  no left ventricular hypertrophy. Abnormal (paradoxical) septal motion consistent with post-operative status. Left ventricular diastolic function could not be evaluated due to mitral valve repair. Left ventricular diastolic function could not be evaluated. Right Ventricle: The right ventricular size is moderately enlarged. No increase in right ventricular wall thickness. Right ventricular systolic function is mildly reduced. There is moderately elevated pulmonary artery systolic pressure. The tricuspid regurgitant velocity is 3.58 m/s, and with an assumed right atrial pressure of 3 mmHg, the estimated right ventricular systolic pressure is 54.3 mmHg. Left Atrium: Left atrial size was mildly dilated. Right Atrium: Right atrial size was mildly dilated. Pericardium: There is no evidence of pericardial effusion. Mitral Valve: 26 mm annuloplasty ring. MG 9 mm HG @ 77 bpm. Higher than reported at [facility] 06/05/2022 (4 mmHG @ 69 bpm). Suspect a mild  element of stenosis with the small annuloplasty ring with higher gradient on this study explained by higher heart rate/sepsis/anemia. Would recommend repeat echo after acute illness. The mitral valve has been repaired/replaced. No evidence of mitral valve regurgitation. There is a 28 mm prosthetic annuloplasty ring present in the mitral position. Procedure Date: 08/2010. Mild mitral valve stenosis. MV peak gradient, 22.5 mmHg. The mean mitral valve gradient is 9.0 mmHg with average heart rate of 77 bpm. Tricuspid Valve: The tricuspid valve is grossly normal. Tricuspid valve regurgitation is mild to moderate. No evidence of tricuspid stenosis. Aortic Valve: 25 mm CE bioprosthetic aortic valve replacement. Vmax 2.6 m/s, MG 17 mmHG, EOA 1.56 cm2. MG 8 mmHG at [facility] (06/05/2022). Gradients could be higher due to sepsis/anemia. Would recommend repeat echo after acute illness. The aortic valve has been repaired/replaced. Aortic valve regurgitation is not visualized. Aortic valve mean gradient measures 17.0 mmHg. Aortic valve peak gradient measures 27.2 mmHg. Aortic valve area, by VTI measures 1.56 cm. There is a 25 mm valve present in the aortic position. Pulmonic Valve: The pulmonic valve was grossly normal. Pulmonic valve regurgitation is mild to moderate. No evidence of pulmonic stenosis. Aorta: The aortic root and ascending aorta are structurally normal, with no evidence of dilitation. Venous: The inferior vena cava is normal in size with greater than 50% respiratory variability, suggesting right atrial pressure of 3 mmHg. IAS/Shunts: The atrial septum is grossly normal.  LEFT VENTRICLE PLAX 2D LVIDd:  5.20 cm     Diastology LVIDs:         4.00 cm     LV e' medial:    5.33 cm/s LV PW:         0.90 cm     LV E/e' medial:  31.9 LV IVS:        1.10 cm     LV e' lateral:   11.20 cm/s LVOT diam:     2.24 cm     LV E/e' lateral: 15.2 LV SV:         83 LV SV Index:   45 LVOT Area:     3.94 cm  LV Volumes (MOD) LV vol  d, MOD A2C: 78.2 ml LV vol d, MOD A4C: 74.6 ml LV vol s, MOD A2C: 30.4 ml LV vol s, MOD A4C: 33.3 ml LV SV MOD A2C:     47.8 ml LV SV MOD A4C:     74.6 ml LV SV MOD BP:      44.6 ml RIGHT VENTRICLE RV Basal diam:  3.60 cm RV Mid diam:    2.80 cm RV S prime:     10.90 cm/s TAPSE (M-mode): 1.4 cm LEFT ATRIUM             Index        RIGHT ATRIUM           Index LA diam:        4.60 cm 2.47 cm/m   RA Area:     19.60 cm LA Vol (A2C):   71.0 ml 38.11 ml/m  RA Volume:   59.50 ml  31.94 ml/m LA Vol (A4C):   57.6 ml 30.92 ml/m LA Biplane Vol: 65.7 ml 35.26 ml/m  AORTIC VALVE AV Area (Vmax):    1.31 cm AV Area (Vmean):   1.33 cm AV Area (VTI):     1.56 cm AV Vmax:           261.00 cm/s AV Vmean:          192.000 cm/s AV VTI:            0.532 m AV Peak Grad:      27.2 mmHg AV Mean Grad:      17.0 mmHg LVOT Vmax:         86.50 cm/s LVOT Vmean:        64.700 cm/s LVOT VTI:          0.211 m LVOT/AV VTI ratio: 0.40 MITRAL VALVE                TRICUSPID VALVE MV Area (PHT): 2.62 cm     TR Peak grad:   51.3 mmHg MV Area VTI:   1.15 cm     TR Vmax:        358.00 cm/s MV Peak grad:  22.5 mmHg MV Mean grad:  9.0 mmHg     SHUNTS MV Vmax:       2.37 m/s     Systemic VTI:  0.21 m MV Vmean:      141.0 cm/s   Systemic Diam: 2.24 cm MV VTI:        0.72 m MV Decel Time: 290 msec MV E velocity: 170.00 cm/s MV A velocity: 98.50 cm/s MV E/A ratio:  1.73 Lennie Odor MD Electronically signed by Lennie Odor MD Signature Date/Time: 07/25/2022/11:00:12 AM    Final    CT Head Wo Contrast  Result Date: 07/24/2022 CLINICAL DATA:  Mental  status change, fall. EXAM: CT HEAD WITHOUT CONTRAST TECHNIQUE: Contiguous axial images were obtained from the base of the skull through the vertex without intravenous contrast. RADIATION DOSE REDUCTION: This exam was performed according to the departmental dose-optimization program which includes automated exposure control, adjustment of the mA and/or kV according to patient size and/or use of iterative  reconstruction technique. COMPARISON:  Head CT 06/16/2003.  MRI brain 04/12/2016. FINDINGS: Brain: No evidence of acute infarction, hemorrhage, hydrocephalus, extra-axial collection or mass lesion/mass effect. 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 Mild diffuse atrophy is unchanged. Vascular: Atherosclerotic calcifications are present within the cavernous internal carotid arteries. Skull: Normal. Negative for fracture or focal lesion. Sinuses/Orbits: No acute finding. Other: None. IMPRESSION: 1. No acute intracranial abnormality. 2. 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 3. Mild diffuse atrophy is unchanged. Electronically Signed   By: Darliss Cheney M.D.   On: 07/24/2022 21:15   DG Chest 2 View  Result Date: 07/24/2022 CLINICAL DATA:  Suspected sepsis, fall. EXAM: CHEST - 2 VIEW COMPARISON:  12/05/2016. FINDINGS: Heart is enlarged and the mediastinal contour is within normal limits. 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 There is atherosclerotic calcification of the aorta. Lung volumes are low and mild interstitial prominence is noted bilaterally. No effusion or pneumothorax. Sternotomy wires and cervical spinal fusion hardware are noted. Degenerative changes are present in the thoracic spine. IMPRESSION: 1. Cardiomegaly. 2. Interstitial prominence bilaterally, which may be accentuated due to low lung volumes. Electronically Signed   By: Thornell Sartorius M.D.   On: 07/24/2022 20:43      Subjective: Patient seen and examined at bedside today.  Hemodynamically stable comfortable.  Lying in bed.  Denies shortness of breath or cough or abdominal pain.  Waiting for EGD.  Discharge Exam: Vitals:   07/27/22 1146 07/27/22 1156  BP: (!) 124/58 (!) 113/99  Pulse: 72 70  Resp: 16 20  Temp:    SpO2: 96% 95%   Vitals:   07/27/22 1015 07/27/22 1136 07/27/22 1146 07/27/22 1156  BP: (!) 163/49 (!) 107/29 (!) 124/58 (!) 113/99  Pulse: 76 67 72 70  Resp:  (!) 22 20 16 20   Temp: 97.7 F (36.5 C) 97.6 F (36.4 C)    TempSrc: Temporal Tympanic    SpO2: 95% 100% 96% 95%  Weight: 76.8 kg     Height: 5\' 6"  (1.676 m)       General: Pt is alert, awake, not in acute distress Cardiovascular: RRR, S1/S2 +, no rubs, no gallops Respiratory: CTA bilaterally, no wheezing, no rhonchi Abdominal: Soft, NT, ND, bowel sounds + Extremities: no edema, no cyanosis    The results of significant diagnostics from this hospitalization (including imaging, microbiology, ancillary and laboratory) are listed below for reference.     Microbiology: Recent Results (from the past 240 hour(s))  Culture, blood (Routine x 2)     Status: None (Preliminary result)   Collection Time: 07/24/22  8:11 PM   Specimen: BLOOD  Result Value Ref Range Status   Specimen Description   Final    BLOOD BLOOD LEFT FOREARM Performed at [facility], [address] [place], [place] [address]    Special Requests   Final    BOTTLES DRAWN AEROBIC AND ANAEROBIC Blood Culture results may not be optimal due to an excessive volume of blood received in culture bottles Performed at [facility], [address] [place], [place] [address]  Culture   Final    NO GROWTH 3 DAYS Performed at [facility], [address], [place], [place] [address]    Report Status PENDING  Incomplete  Resp panel by RT-PCR (RSV, Flu A&B, Covid) Anterior Nasal Swab     Status: None   Collection Time: 07/24/22  8:16 PM   Specimen: Anterior Nasal Swab  Result Value Ref Range Status   SARS Coronavirus 2 by RT PCR NEGATIVE NEGATIVE Final    Comment: (NOTE) SARS-CoV-2 target nucleic acids are NOT DETECTED.  The SARS-CoV-2 RNA is generally detectable in upper respiratory specimens during the acute phase of infection. The lowest concentration of SARS-CoV-2 viral copies this assay can detect is 138 copies/mL. A negative result does not preclude SARS-Cov-2 infection  and should not be used as the sole basis for treatment or other patient management decisions. A negative result may occur with  improper specimen collection/handling, submission of specimen other than nasopharyngeal swab, presence of viral mutation(s) within the areas targeted by this assay, and inadequate number of viral copies(<138 copies/mL). A negative result must be combined with clinical observations, patient history, and epidemiological information. The expected result is Negative.  Fact Sheet for Patients:  BloggerCourse.com  Fact Sheet for Healthcare Providers:  SeriousBroker.it  This test is no t yet approved or cleared by the [place] FDA and  has been authorized for detection and/or diagnosis of SARS-CoV-2 by FDA under an Emergency Use Authorization (EUA). This EUA will remain  in effect (meaning this test can be used) for the duration of the COVID-19 declaration under Section 564(b)(1) of the Act, 21 U.S.C.section 360bbb-3(b)(1), unless the authorization is terminated  or revoked sooner.       Influenza A by PCR NEGATIVE NEGATIVE Final   Influenza B by PCR NEGATIVE NEGATIVE Final    Comment: (NOTE) The Xpert Xpress SARS-CoV-2/FLU/RSV plus assay is intended as an aid in the diagnosis of influenza from Nasopharyngeal swab specimens and should not be used as a sole basis for treatment. Nasal washings and aspirates are unacceptable for Xpert Xpress SARS-CoV-2/FLU/RSV testing.  Fact Sheet for Patients: BloggerCourse.com  Fact Sheet for Healthcare Providers: SeriousBroker.it  This test is not yet approved or cleared by the [place] FDA and has been authorized for detection and/or diagnosis of SARS-CoV-2 by FDA under an Emergency Use Authorization (EUA). This EUA will remain in effect (meaning this test can be used) for the duration of the COVID-19 declaration  under Section 564(b)(1) of the Act, 21 U.S.C. section 360bbb-3(b)(1), unless the authorization is terminated or revoked.     Resp Syncytial Virus by PCR NEGATIVE NEGATIVE Final    Comment: (NOTE) Fact Sheet for Patients: BloggerCourse.com  Fact Sheet for Healthcare Providers: SeriousBroker.it  This test is not yet approved or cleared by the [place] FDA and has been authorized for detection and/or diagnosis of SARS-CoV-2 by FDA under an Emergency Use Authorization (EUA). This EUA will remain in effect (meaning this test can be used) for the duration of the COVID-19 declaration under Section 564(b)(1) of the Act, 21 U.S.C. section 360bbb-3(b)(1), unless the authorization is terminated or revoked.  Performed at [facility], [address] [place], [place] [address]   Culture, blood (Routine x 2)     Status: None (Preliminary result)   Collection Time: 07/24/22  8:16 PM   Specimen: BLOOD  Result Value Ref Range Status   Specimen Description   Final    BLOOD LEFT ANTECUBITAL Performed at  [facility], [address] [place], [place] [address]    Special Requests   Final    BOTTLES DRAWN AEROBIC AND ANAEROBIC Blood Culture adequate volume Performed at [facility], [address] [place], [place] [address]    Culture   Final    NO GROWTH 3 DAYS Performed at [facility], [address] [place], [place] [address]    Report Status PENDING  Incomplete     Labs: BNP (last 3 results) Recent Labs    07/24/22 2016  BNP 282.6*   Basic Metabolic Panel: Recent Labs  Lab 07/24/22 2016 07/25/22 0609 07/26/22 0553 07/27/22 0600  NA 135 135 133* 135  K 4.6 4.1 3.5 3.5  CL 103 106 103 105  CO2 21* 19* 20* 21*  GLUCOSE 237* 214* 200* 225*  BUN 23 21 17 14   CREATININE 1.25* 1.08 1.12 1.20  CALCIUM 8.9 8.1* 8.3* 8.6*  MG 2.2 1.8  --   --   PHOS  --  3.5   --   --    Liver Function Tests: Recent Labs  Lab 07/24/22 2016 07/25/22 0609  AST 65* 38  ALT 41 32  ALKPHOS 64 51  BILITOT 0.8 0.7  PROT 7.8 6.5  ALBUMIN 3.9 3.1*   No results for input(s): "LIPASE", "AMYLASE" in the last 168 hours. No results for input(s): "AMMONIA" in the last 168 hours. CBC: Recent Labs  Lab 07/24/22 2016 07/25/22 0609 07/26/22 0553 07/27/22 0600  WBC 11.6* 9.7 9.9 12.1*  NEUTROABS 9.9* 7.3  --   --   HGB 8.3* 7.1* 6.8* 7.9*  HCT 25.7* 23.4* 21.1* 24.2*  MCV 93.1 96.3 94.2 92.0  PLT 245 208 218 250   Cardiac Enzymes: No results for input(s): "CKTOTAL", "CKMB", "CKMBINDEX", "TROPONINI" in the last 168 hours. BNP: Invalid input(s): "POCBNP" CBG: Recent Labs  Lab 07/26/22 1211 07/26/22 1704 07/26/22 2031 07/27/22 0740 07/27/22 1303  GLUCAP 213* 182* 245* 224* 240*   D-Dimer No results for input(s): "DDIMER" in the last 72 hours. Hgb A1c Recent Labs    07/25/22 0609  HGBA1C 7.0*   Lipid Profile No results for input(s): "CHOL", "HDL", "LDLCALC", "TRIG", "CHOLHDL", "LDLDIRECT" in the last 72 hours. Thyroid function studies Recent Labs    07/25/22 0609  TSH 2.206   Anemia work up Recent Labs    07/25/22 0609  VITAMINB12 201  FOLATE 26.9  FERRITIN 101  TIBC 354  IRON 16*  RETICCTPCT 1.8   Urinalysis    Component Value Date/Time   COLORURINE YELLOW 07/24/2022 2011   APPEARANCEUR CLEAR 07/24/2022 2011   LABSPEC 1.012 07/24/2022 2011   LABSPEC 1.010 12/02/2015 1119   PHURINE 5.0 07/24/2022 2011   GLUCOSEU 50 (A) 07/24/2022 2011   GLUCOSEU 1,000 12/02/2015 1119   HGBUR SMALL (A) 07/24/2022 2011   BILIRUBINUR NEGATIVE 07/24/2022 2011   BILIRUBINUR Negative 12/02/2015 1119   KETONESUR 5 (A) 07/24/2022 2011   PROTEINUR 100 (A) 07/24/2022 2011   UROBILINOGEN 0.2 12/02/2015 1119   NITRITE NEGATIVE 07/24/2022 2011   LEUKOCYTESUR NEGATIVE 07/24/2022 2011   LEUKOCYTESUR Negative 12/02/2015 1119   Sepsis Labs Recent Labs   Lab 07/24/22 2016 07/25/22 0609 07/26/22 0553 07/27/22 0600  WBC 11.6* 9.7 9.9 12.1*   Microbiology Recent Results (from the past 240 hour(s))  Culture, blood (Routine x 2)     Status: None (Preliminary result)   Collection Time: 07/24/22  8:11 PM   Specimen: BLOOD  Result Value  Ref Range Status   Specimen Description   Final    BLOOD BLOOD LEFT FOREARM Performed at [facility], [address], [place], [place] [address]    Special Requests   Final    BOTTLES DRAWN AEROBIC AND ANAEROBIC Blood Culture results may not be optimal due to an excessive volume of blood received in culture bottles Performed at [facility], [address], [place], [place] [address]    Culture   Final    NO GROWTH 3 DAYS Performed at [facility], [address], [place], [place] [address]    Report Status PENDING  Incomplete  Resp panel by RT-PCR (RSV, Flu A&B, Covid) Anterior Nasal Swab     Status: None   Collection Time: 07/24/22  8:16 PM   Specimen: Anterior Nasal Swab  Result Value Ref Range Status   SARS Coronavirus 2 by RT PCR NEGATIVE NEGATIVE Final    Comment: (NOTE) SARS-CoV-2 target nucleic acids are NOT DETECTED.  The SARS-CoV-2 RNA is generally detectable in upper respiratory specimens during the acute phase of infection. The lowest concentration of SARS-CoV-2 viral copies this assay can detect is 138 copies/mL. A negative result does not preclude SARS-Cov-2 infection and should not be used as the sole basis for treatment or other patient management decisions. A negative result may occur with  improper specimen collection/handling, submission of specimen other than nasopharyngeal swab, presence of viral mutation(s) within the areas targeted by this assay, and inadequate number of viral copies(<138 copies/mL). A negative result must be combined with clinical observations, patient history, and epidemiological information. The expected  result is Negative.  Fact Sheet for Patients:  BloggerCourse.com  Fact Sheet for Healthcare Providers:  SeriousBroker.it  This test is no t yet approved or cleared by the [place] FDA and  has been authorized for detection and/or diagnosis of SARS-CoV-2 by FDA under an Emergency Use Authorization (EUA). This EUA will remain  in effect (meaning this test can be used) for the duration of the COVID-19 declaration under Section 564(b)(1) of the Act, 21 U.S.C.section 360bbb-3(b)(1), unless the authorization is terminated  or revoked sooner.       Influenza A by PCR NEGATIVE NEGATIVE Final   Influenza B by PCR NEGATIVE NEGATIVE Final    Comment: (NOTE) The Xpert Xpress SARS-CoV-2/FLU/RSV plus assay is intended as an aid in the diagnosis of influenza from Nasopharyngeal swab specimens and should not be used as a sole basis for treatment. Nasal washings and aspirates are unacceptable for Xpert Xpress SARS-CoV-2/FLU/RSV testing.  Fact Sheet for Patients: BloggerCourse.com  Fact Sheet for Healthcare Providers: SeriousBroker.it  This test is not yet approved or cleared by the [place] FDA and has been authorized for detection and/or diagnosis of SARS-CoV-2 by FDA under an Emergency Use Authorization (EUA). This EUA will remain in effect (meaning this test can be used) for the duration of the COVID-19 declaration under Section 564(b)(1) of the Act, 21 U.S.C. section 360bbb-3(b)(1), unless the authorization is terminated or revoked.     Resp Syncytial Virus by PCR NEGATIVE NEGATIVE Final    Comment: (NOTE) Fact Sheet for Patients: BloggerCourse.com  Fact Sheet for Healthcare Providers: SeriousBroker.it  This test is not yet approved or cleared by the [place] FDA and has been authorized for detection and/or diagnosis of  SARS-CoV-2 by FDA under an Emergency Use Authorization (EUA). This EUA will remain in effect (meaning this test can be used) for the duration of the COVID-19  declaration under Section 564(b)(1) of the Act, 21 U.S.C. section 360bbb-3(b)(1), unless the authorization is terminated or revoked.  Performed at [facility], [address] [place], [place] [address]   Culture, blood (Routine x 2)     Status: None (Preliminary result)   Collection Time: 07/24/22  8:16 PM   Specimen: BLOOD  Result Value Ref Range Status   Specimen Description   Final    BLOOD LEFT ANTECUBITAL Performed at [facility], [address], [place], [place] [address]    Special Requests   Final    BOTTLES DRAWN AEROBIC AND ANAEROBIC Blood Culture adequate volume Performed at [facility], [address] [place], [place] [address]    Culture   Final    NO GROWTH 3 DAYS Performed at [facility], [address] [place], [place] [address]    Report Status PENDING  Incomplete    Please note: You were cared for by a hospitalist during your hospital stay. Once you are discharged, your primary care physician will handle any further medical issues. Please note that NO REFILLS for any discharge medications will be authorized once you are discharged, as it is imperative that you return to your primary care physician (or establish a relationship with a primary care physician if you do not have one) for your post hospital discharge needs so that they can reassess your need for medications and monitor your lab values.    Time coordinating discharge: 40 minutes  SIGNED:   Burnadette Pop, MD  [facility] 07/27/2022, 1:22 PM Pager [phone]  If 7PM-7AM, please contact night-coverage [email or web address] Password [email or web address]

## 2022-07-27 NOTE — Progress Notes (Signed)
[name] Garrels 11:12 AM  Subjective: Patient doing well without signs of bleeding and no new complaints and case discussed with his wife  Objective: Vital signs stable afebrile no acute distress exam please see preassessment evaluation BUN and creatinine okay globin increased with transfusion  Assessment: Back positive anemia in patient on an aspirin a day  Plan: Okay to proceed with endoscopy with anesthesia assistance with further workup and plans pending those findings  [facility]  office [phone] After 5PM or if no answer call [phone]

## 2022-07-27 NOTE — Anesthesia Postprocedure Evaluation (Signed)
Anesthesia Post Note  Patient: [name]  Procedure(s) Performed: ESOPHAGOGASTRODUODENOSCOPY (EGD) WITH PROPOFOL     Patient location during evaluation: Endoscopy Anesthesia Type: MAC Level of consciousness: awake Pain management: pain level controlled Vital Signs Assessment: post-procedure vital signs reviewed and stable Respiratory status: spontaneous breathing, nonlabored ventilation and respiratory function stable Cardiovascular status: blood pressure returned to baseline and stable Postop Assessment: no apparent nausea or vomiting Anesthetic complications: no   No notable events documented.  Last Vitals:  Vitals:   07/27/22 1146 07/27/22 1156  BP: (!) 124/58 (!) 113/99  Pulse: 72 70  Resp: 16 20  Temp:    SpO2: 96% 95%    Last Pain:  Vitals:   07/27/22 1156  TempSrc:   PainSc: 0-No pain                 Elizabeth Haff P Amiri Tritch

## 2022-07-27 NOTE — TOC Transition Note (Signed)
Transition of Care [facility]) - CM/SW Discharge Note   Patient Details  Name: [name] MRN: [id number] Date of Birth: [birth year]  [facility] CM/SW Contact:  Beckie Busing, RN Phone Number:[phone]  07/27/2022, 3:06 PM   Clinical Narrative:    Patient with discharge orders.TOC consulted for [facility] needs. CM at bedside introduces self and explained consult for home health. Patient and wife at bedside agreeable to home health PT. CM presented spouse with Medicare .gov list for choice. Wife does not have a preference. Home health referral called to [facility]. Cory with [facility] has accepted the referral. AVS updated. No other needs noted at this time. TOC will sign off.    Final next level of care: Home w Home Health Services Barriers to Discharge: No Barriers Identified   Patient Goals and CMS Choice CMS Medicare.gov Compare Post Acute Care list provided to:: Patient Represenative (must comment) (spouse at bedside) Choice offered to / list presented to : Spouse  Discharge Placement                         Discharge Plan and Services Additional resources added to the After Visit Summary for   In-house Referral: NA Discharge Planning Services: CM Consult Post Acute Care Choice: Home Health          DME Arranged: N/A (patient reports walker at home and no other needs) DME Agency: NA       HH Arranged: PT HH Agency: [facility] Date [facility] Agency Contacted: 07/27/22 Time HH Agency Contacted: 1505 Representative spoke with at [facility] Agency: Kandee Keen  Social Determinants of Health (SDOH) Interventions SDOH Screenings   Food Insecurity: No Food Insecurity (07/25/2022)  Housing: Low Risk  (07/25/2022)  Transportation Needs: No Transportation Needs (07/25/2022)  Utilities: Not At Risk (07/25/2022)  Tobacco Use: Medium Risk (07/27/2022)     Readmission Risk Interventions    07/27/2022    2:50 PM  Readmission Risk Prevention Plan  Transportation Screening Complete   PCP or Specialist Appt within 5-7 Days Complete  Home Care Screening Complete  Medication Review (RN CM) Complete

## 2022-07-28 LAB — CULTURE, BLOOD (ROUTINE X 2): Culture: NO GROWTH

## 2022-07-29 LAB — CULTURE, BLOOD (ROUTINE X 2)

## 2022-07-31 ENCOUNTER — Encounter (HOSPITAL_COMMUNITY): Payer: Self-pay | Admitting: Gastroenterology

## 2022-08-01 DIAGNOSIS — J9691 Respiratory failure, unspecified with hypoxia: Secondary | ICD-10-CM | POA: Diagnosis not present

## 2022-08-01 DIAGNOSIS — J40 Bronchitis, not specified as acute or chronic: Secondary | ICD-10-CM | POA: Diagnosis not present

## 2022-08-01 DIAGNOSIS — D509 Iron deficiency anemia, unspecified: Secondary | ICD-10-CM | POA: Diagnosis not present

## 2022-08-01 DIAGNOSIS — D500 Iron deficiency anemia secondary to blood loss (chronic): Secondary | ICD-10-CM | POA: Diagnosis not present

## 2022-08-11 ENCOUNTER — Other Ambulatory Visit: Payer: Self-pay | Admitting: Gastroenterology

## 2022-08-11 DIAGNOSIS — D649 Anemia, unspecified: Secondary | ICD-10-CM

## 2022-08-11 DIAGNOSIS — K2900 Acute gastritis without bleeding: Secondary | ICD-10-CM | POA: Diagnosis not present

## 2022-08-11 DIAGNOSIS — D508 Other iron deficiency anemias: Secondary | ICD-10-CM | POA: Diagnosis not present

## 2022-08-16 DIAGNOSIS — L609 Nail disorder, unspecified: Secondary | ICD-10-CM | POA: Diagnosis not present

## 2022-08-16 DIAGNOSIS — Z8546 Personal history of malignant neoplasm of prostate: Secondary | ICD-10-CM | POA: Diagnosis not present

## 2022-08-16 DIAGNOSIS — E1122 Type 2 diabetes mellitus with diabetic chronic kidney disease: Secondary | ICD-10-CM | POA: Diagnosis not present

## 2022-08-16 DIAGNOSIS — G4733 Obstructive sleep apnea (adult) (pediatric): Secondary | ICD-10-CM | POA: Diagnosis not present

## 2022-08-16 DIAGNOSIS — Z794 Long term (current) use of insulin: Secondary | ICD-10-CM | POA: Diagnosis not present

## 2022-08-16 DIAGNOSIS — E1142 Type 2 diabetes mellitus with diabetic polyneuropathy: Secondary | ICD-10-CM | POA: Diagnosis not present

## 2022-08-16 DIAGNOSIS — I2510 Atherosclerotic heart disease of native coronary artery without angina pectoris: Secondary | ICD-10-CM | POA: Diagnosis not present

## 2022-08-16 DIAGNOSIS — Z952 Presence of prosthetic heart valve: Secondary | ICD-10-CM | POA: Diagnosis not present

## 2022-08-16 DIAGNOSIS — Z0000 Encounter for general adult medical examination without abnormal findings: Secondary | ICD-10-CM | POA: Diagnosis not present

## 2022-08-16 DIAGNOSIS — I10 Essential (primary) hypertension: Secondary | ICD-10-CM | POA: Diagnosis not present

## 2022-08-16 DIAGNOSIS — N400 Enlarged prostate without lower urinary tract symptoms: Secondary | ICD-10-CM | POA: Diagnosis not present

## 2022-08-18 ENCOUNTER — Ambulatory Visit
Admission: RE | Admit: 2022-08-18 | Discharge: 2022-08-18 | Disposition: A | Discharge status: Home / Self Care | Payer: Medicare Other | Source: Ambulatory Visit | Attending: Gastroenterology | Admitting: Gastroenterology

## 2022-08-18 DIAGNOSIS — K6289 Other specified diseases of anus and rectum: Secondary | ICD-10-CM | POA: Diagnosis not present

## 2022-08-18 DIAGNOSIS — D649 Anemia, unspecified: Secondary | ICD-10-CM

## 2022-08-18 DIAGNOSIS — I700 Atherosclerosis of aorta: Secondary | ICD-10-CM | POA: Diagnosis not present

## 2022-08-18 DIAGNOSIS — N329 Bladder disorder, unspecified: Secondary | ICD-10-CM | POA: Diagnosis not present

## 2022-08-29 DIAGNOSIS — D225 Melanocytic nevi of trunk: Secondary | ICD-10-CM | POA: Diagnosis not present

## 2022-08-29 DIAGNOSIS — D1801 Hemangioma of skin and subcutaneous tissue: Secondary | ICD-10-CM | POA: Diagnosis not present

## 2022-08-29 DIAGNOSIS — L821 Other seborrheic keratosis: Secondary | ICD-10-CM | POA: Diagnosis not present

## 2022-08-29 DIAGNOSIS — Z85820 Personal history of malignant melanoma of skin: Secondary | ICD-10-CM | POA: Diagnosis not present

## 2022-08-29 DIAGNOSIS — L570 Actinic keratosis: Secondary | ICD-10-CM | POA: Diagnosis not present

## 2022-08-29 DIAGNOSIS — Z85828 Personal history of other malignant neoplasm of skin: Secondary | ICD-10-CM | POA: Diagnosis not present

## 2022-08-29 DIAGNOSIS — L905 Scar conditions and fibrosis of skin: Secondary | ICD-10-CM | POA: Diagnosis not present

## 2022-08-29 DIAGNOSIS — C44519 Basal cell carcinoma of skin of other part of trunk: Secondary | ICD-10-CM | POA: Diagnosis not present

## 2022-08-29 DIAGNOSIS — D485 Neoplasm of uncertain behavior of skin: Secondary | ICD-10-CM | POA: Diagnosis not present

## 2022-09-04 ENCOUNTER — Ambulatory Visit (INDEPENDENT_AMBULATORY_CARE_PROVIDER_SITE_OTHER): Payer: Medicare Other | Admitting: Podiatry

## 2022-09-04 DIAGNOSIS — Z794 Long term (current) use of insulin: Secondary | ICD-10-CM

## 2022-09-04 DIAGNOSIS — B351 Tinea unguium: Secondary | ICD-10-CM | POA: Diagnosis not present

## 2022-09-04 DIAGNOSIS — E1139 Type 2 diabetes mellitus with other diabetic ophthalmic complication: Secondary | ICD-10-CM | POA: Diagnosis not present

## 2022-09-04 DIAGNOSIS — M79674 Pain in right toe(s): Secondary | ICD-10-CM | POA: Diagnosis not present

## 2022-09-04 DIAGNOSIS — M79675 Pain in left toe(s): Secondary | ICD-10-CM

## 2022-09-04 NOTE — Progress Notes (Unsigned)
       Subjective:  Patient ID: [name], male    DOB: 1935/04/24,  MRN: [id number]   [name] presents to clinic today for:  Chief Complaint  Patient presents with   Nail Problem    Diabetic foot care   . Patient notes nails are thick, discolored, elongated and painful in shoegear when trying to ambulate.  He uses a cane for assistance with ambulation.  PCP is Renford Dills, MD.  No Known Allergies  Review of Systems: Negative except as noted in the HPI.  Objective:  There were no vitals filed for this visit.  [name] is a pleasant 87 y.o. male in NAD. AAO x 3.  Vascular Examination: Capillary refill time is 3-5 seconds to toes bilateral. Palpable pedal pulses b/l LE. Digital hair present b/l. No pedal edema b/l. Skin temperature gradient WNL b/l. No varicosities b/l. No cyanosis or clubbing noted b/l.   Dermatological Examination: Pedal skin with normal turgor, texture and tone b/l. No open wounds. No interdigital macerations b/l. Toenails x10 are 3mm thick, discolored, dystrophic with subungual debris. There is pain with compression of the nail plates.  They are elongated x10  Neurological Examination: Protective sensation intact bilateral LE. Vibratory sensation intact bilateral LE.  Musculoskeletal Examination: Muscle strength 5/5 to all LE muscle groups b/l.      Latest Ref Rng & Units 07/25/2022    6:09 AM  Hemoglobin A1C  Hemoglobin-A1c 4.8 - 5.6 % 7.0    Assessment/Plan: 1. Pain due to onychomycosis of toenails of both feet   2. Type 2 diabetes mellitus with other ophthalmic complication, with long-term current use of insulin (HCC)     The mycotic toenails were sharply debrided x10 with sterile nail nippers and a power debriding burr to decrease bulk/thickness and length.  Discussed treatment options for the onychomycosis.  Patient will opt not to treat at this time.  As long as he follows up routinely at our office, the nail  Return in  about 3 months (around 12/05/2022) for RFC.   Clerance Lav, DPM, FACFAS [facility]     [address] [place], [place] [address]                Office [phone]  Fax [phone]

## 2022-09-15 ENCOUNTER — Other Ambulatory Visit: Payer: Self-pay

## 2022-09-15 ENCOUNTER — Emergency Department (HOSPITAL_COMMUNITY)
Admission: EM | Admit: 2022-09-15 | Discharge: 2022-09-16 | Disposition: A | Discharge status: Home / Self Care | Payer: Medicare Other | Source: Home / Self Care | Attending: Emergency Medicine | Admitting: Emergency Medicine

## 2022-09-15 ENCOUNTER — Encounter (HOSPITAL_COMMUNITY): Payer: Self-pay | Admitting: Emergency Medicine

## 2022-09-15 DIAGNOSIS — I2510 Atherosclerotic heart disease of native coronary artery without angina pectoris: Secondary | ICD-10-CM | POA: Diagnosis not present

## 2022-09-15 DIAGNOSIS — I10 Essential (primary) hypertension: Secondary | ICD-10-CM | POA: Insufficient documentation

## 2022-09-15 DIAGNOSIS — N182 Chronic kidney disease, stage 2 (mild): Secondary | ICD-10-CM | POA: Diagnosis present

## 2022-09-15 DIAGNOSIS — E1140 Type 2 diabetes mellitus with diabetic neuropathy, unspecified: Secondary | ICD-10-CM | POA: Diagnosis present

## 2022-09-15 DIAGNOSIS — C61 Malignant neoplasm of prostate: Secondary | ICD-10-CM | POA: Diagnosis not present

## 2022-09-15 DIAGNOSIS — Z85828 Personal history of other malignant neoplasm of skin: Secondary | ICD-10-CM | POA: Diagnosis not present

## 2022-09-15 DIAGNOSIS — Z923 Personal history of irradiation: Secondary | ICD-10-CM | POA: Diagnosis not present

## 2022-09-15 DIAGNOSIS — Z794 Long term (current) use of insulin: Secondary | ICD-10-CM | POA: Insufficient documentation

## 2022-09-15 DIAGNOSIS — E119 Type 2 diabetes mellitus without complications: Secondary | ICD-10-CM | POA: Insufficient documentation

## 2022-09-15 DIAGNOSIS — Z8673 Personal history of transient ischemic attack (TIA), and cerebral infarction without residual deficits: Secondary | ICD-10-CM | POA: Diagnosis not present

## 2022-09-15 DIAGNOSIS — F32A Depression, unspecified: Secondary | ICD-10-CM | POA: Diagnosis present

## 2022-09-15 DIAGNOSIS — E785 Hyperlipidemia, unspecified: Secondary | ICD-10-CM | POA: Diagnosis present

## 2022-09-15 DIAGNOSIS — Z87442 Personal history of urinary calculi: Secondary | ICD-10-CM | POA: Diagnosis not present

## 2022-09-15 DIAGNOSIS — R339 Retention of urine, unspecified: Secondary | ICD-10-CM | POA: Diagnosis not present

## 2022-09-15 DIAGNOSIS — Z8249 Family history of ischemic heart disease and other diseases of the circulatory system: Secondary | ICD-10-CM | POA: Diagnosis not present

## 2022-09-15 DIAGNOSIS — Z951 Presence of aortocoronary bypass graft: Secondary | ICD-10-CM | POA: Diagnosis not present

## 2022-09-15 DIAGNOSIS — Z79899 Other long term (current) drug therapy: Secondary | ICD-10-CM | POA: Insufficient documentation

## 2022-09-15 DIAGNOSIS — I959 Hypotension, unspecified: Secondary | ICD-10-CM | POA: Diagnosis present

## 2022-09-15 DIAGNOSIS — R310 Gross hematuria: Secondary | ICD-10-CM | POA: Diagnosis not present

## 2022-09-15 DIAGNOSIS — Z8546 Personal history of malignant neoplasm of prostate: Secondary | ICD-10-CM | POA: Insufficient documentation

## 2022-09-15 DIAGNOSIS — I110 Hypertensive heart disease with heart failure: Secondary | ICD-10-CM | POA: Diagnosis not present

## 2022-09-15 DIAGNOSIS — N3040 Irradiation cystitis without hematuria: Secondary | ICD-10-CM | POA: Diagnosis not present

## 2022-09-15 DIAGNOSIS — Z955 Presence of coronary angioplasty implant and graft: Secondary | ICD-10-CM | POA: Diagnosis not present

## 2022-09-15 DIAGNOSIS — Z7984 Long term (current) use of oral hypoglycemic drugs: Secondary | ICD-10-CM | POA: Insufficient documentation

## 2022-09-15 DIAGNOSIS — N3041 Irradiation cystitis with hematuria: Secondary | ICD-10-CM | POA: Diagnosis not present

## 2022-09-15 DIAGNOSIS — I5032 Chronic diastolic (congestive) heart failure: Secondary | ICD-10-CM | POA: Diagnosis not present

## 2022-09-15 DIAGNOSIS — Z953 Presence of xenogenic heart valve: Secondary | ICD-10-CM | POA: Diagnosis not present

## 2022-09-15 DIAGNOSIS — E1165 Type 2 diabetes mellitus with hyperglycemia: Secondary | ICD-10-CM | POA: Diagnosis present

## 2022-09-15 DIAGNOSIS — G4733 Obstructive sleep apnea (adult) (pediatric): Secondary | ICD-10-CM | POA: Diagnosis present

## 2022-09-15 DIAGNOSIS — N179 Acute kidney failure, unspecified: Secondary | ICD-10-CM | POA: Diagnosis present

## 2022-09-15 DIAGNOSIS — Z87891 Personal history of nicotine dependence: Secondary | ICD-10-CM | POA: Diagnosis not present

## 2022-09-15 DIAGNOSIS — I130 Hypertensive heart and chronic kidney disease with heart failure and stage 1 through stage 4 chronic kidney disease, or unspecified chronic kidney disease: Secondary | ICD-10-CM | POA: Diagnosis present

## 2022-09-15 DIAGNOSIS — I509 Heart failure, unspecified: Secondary | ICD-10-CM | POA: Diagnosis not present

## 2022-09-15 DIAGNOSIS — E1122 Type 2 diabetes mellitus with diabetic chronic kidney disease: Secondary | ICD-10-CM | POA: Diagnosis present

## 2022-09-15 LAB — CBC
HCT: 30.1 % — ABNORMAL LOW (ref 39.0–52.0)
Hemoglobin: 9.6 g/dL — ABNORMAL LOW (ref 13.0–17.0)
MCH: 30.3 pg (ref 26.0–34.0)
MCHC: 31.9 g/dL (ref 30.0–36.0)
MCV: 95 fL (ref 80.0–100.0)
Platelets: 224 10*3/uL (ref 150–400)
RBC: 3.17 MIL/uL — ABNORMAL LOW (ref 4.22–5.81)
RDW: 15.3 % (ref 11.5–15.5)
WBC: 9.2 10*3/uL (ref 4.0–10.5)
nRBC: 0 % (ref 0.0–0.2)

## 2022-09-15 LAB — BASIC METABOLIC PANEL
Anion gap: 8 (ref 5–15)
BUN: 28 mg/dL — ABNORMAL HIGH (ref 8–23)
CO2: 24 mmol/L (ref 22–32)
Calcium: 9.1 mg/dL (ref 8.9–10.3)
Chloride: 108 mmol/L (ref 98–111)
Creatinine, Ser: 1.61 mg/dL — ABNORMAL HIGH (ref 0.61–1.24)
GFR, Estimated: 42 mL/min — ABNORMAL LOW (ref >60)
Glucose, Bld: 141 mg/dL — ABNORMAL HIGH (ref 70–99)
Potassium: 4.2 mmol/L (ref 3.5–5.1)
Sodium: 140 mmol/L (ref 135–145)

## 2022-09-15 LAB — URINALYSIS, ROUTINE W REFLEX MICROSCOPIC
Bacteria, UA: NONE SEEN
Bilirubin Urine: NEGATIVE
Glucose, UA: NEGATIVE mg/dL
Ketones, ur: NEGATIVE mg/dL
Leukocytes,Ua: NEGATIVE
Nitrite: NEGATIVE
Protein, ur: 100 mg/dL — AB
RBC / HPF: >50 RBC/hpf (ref 0–5)
Specific Gravity, Urine: 1.008 (ref 1.005–1.030)
pH: 7 (ref 5.0–8.0)

## 2022-09-15 NOTE — Discharge Instructions (Addendum)
Call urology on Monday to schedule appt. Return here for any new/acute changes-- fever, catheter not draining, abdominal pain, etc.

## 2022-09-15 NOTE — ED Triage Notes (Signed)
Pt to ED from home c/o hematuria and urinary retention.  States had large bright red bleeding earlier today, bladder discomfort d/t retention.  Hx of prostate cancer.

## 2022-09-15 NOTE — ED Provider Notes (Signed)
[place] [facility] AT [facility] Provider Note   CSN: [id number] Arrival date & time: 09/15/22  2054     History  Chief Complaint  Patient presents with   Hematuria   Urinary Retention    [name] is a 87 y.o. male.  The history is provided by the patient and medical records.  Hematuria   86 year old male with history of coronary artery disease, prior stroke, diabetes, hypertension, hyperlipidemia, history of prostate cancer, presenting to the ED with urinary retention.  This began around 530 this evening.  He states he attempted to urinate and just had some bloody drainage but not really able to pass any urine.  States he felt a lot of fullness in his bladder.  Foley catheter was placed in triage upon arrival and he reports complete resolution of his symptoms.  He denies any fever, flank pain, nausea, or vomiting.  Was previously followed by [facility] but has not seen anyone since around 2018.  He has required catheter in the past due to prostate issues.  Home Medications Prior to Admission medications   Medication Sig Start Date End Date Taking? Authorizing Provider  cyanocobalamin 1000 MCG tablet Take 1 tablet (1,000 mcg total) by mouth daily. 07/28/22   Burnadette Pop, MD  ferrous sulfate 325 (65 FE) MG tablet Take 1 tablet (325 mg total) by mouth daily with breakfast. 07/28/22   Burnadette Pop, MD  FLUoxetine (PROZAC) 20 MG capsule Take 20 mg by mouth at bedtime.     [provider]  furosemide (LASIX) 20 MG tablet Take 1 tablet (20 mg total) by mouth daily as needed for fluid. 12/28/16   Allred, Fayrene Fearing, MD  gabapentin (NEURONTIN) 600 MG tablet Take 600 mg by mouth 3 (three) times daily. 07/17/22   [provider]  insulin lispro protamine-lispro (HUMALOG 75/25 MIX) (75-25) 100 UNIT/ML SUSP injection Inject 18 Units into the skin 2 (two) times daily with a meal.    [provider]  losartan (COZAAR) 25 MG tablet  Take 1 tablet by mouth once daily 06/28/18   Allred, Fayrene Fearing, MD  Melatonin 3 MG TABS Take 1 tablet by mouth at bedtime.     [provider]  metFORMIN (GLUMETZA) 1000 MG (MOD) 24 hr tablet Take 1 tablet (1,000 mg total) by mouth 2 (two) times daily with a meal. 03/29/12   Arguello, Roger A, PA-C  metoprolol succinate (TOPROL-XL) 50 MG 24 hr tablet Take 1 tablet (50 mg total) by mouth daily. 12/25/16   Allred, Fayrene Fearing, MD  Multiple Vitamin (MULTIVITAMIN) tablet Take 1 tablet by mouth daily.    [provider]  nitroGLYCERIN (NITROSTAT) 0.4 MG SL tablet Place 1 tablet (0.4 mg total) under the tongue every 5 (five) minutes as needed. For chest pain 03/28/12   [name], Deloris Ping, MD  pantoprazole (PROTONIX) 40 MG tablet Take 1 tablet (40 mg total) by mouth daily. 07/27/22 07/27/23  Burnadette Pop, MD  potassium chloride SA (K-DUR,KLOR-CON) 20 MEQ tablet Take 1 tablet (20 mEq total) by mouth daily. Patient not taking: Reported on 07/25/2022 12/20/16   Newman Nip, NP  rosuvastatin (CRESTOR) 10 MG tablet Take 10 mg by mouth every morning.     [provider]  spironolactone (ALDACTONE) 25 MG tablet Take 25 mg by mouth daily.    [provider]  zolpidem (AMBIEN) 10 MG tablet Take 5 mg by mouth at bedtime.    [provider]      Allergies  Patient has no known allergies.    Review of Systems   Review of Systems  Genitourinary:  Positive for hematuria.  All other systems reviewed and are negative.   Physical Exam Updated Vital Signs BP (!) 142/81 (BP Location: Right Arm)   Pulse 71   Temp 98 F (36.7 C) (Oral)   Resp 18   Ht 5\' 6"  (1.676 m)   Wt 71.7 kg   SpO2 97%   BMI 25.50 kg/m   Physical Exam Vitals and nursing note reviewed.  Constitutional:      Appearance: He is well-developed.  HENT:     Head: Normocephalic and atraumatic.  Eyes:     Conjunctiva/sclera: Conjunctivae normal.     Pupils: Pupils are equal, round, and reactive to light.   Cardiovascular:     Rate and Rhythm: Normal rate and regular rhythm.     Heart sounds: Normal heart sounds.  Pulmonary:     Effort: Pulmonary effort is normal.     Breath sounds: Normal breath sounds.  Abdominal:     General: Bowel sounds are normal.     Palpations: Abdomen is soft.  Genitourinary:    Comments: Foley in place, bloody urine present in collection bag, no clots Musculoskeletal:        General: Normal range of motion.     Cervical back: Normal range of motion.  Skin:    General: Skin is warm and dry.  Neurological:     Mental Status: He is alert and oriented to person, place, and time.     ED Results / Procedures / Treatments   Labs (all labs ordered are listed, but only abnormal results are displayed) Labs Reviewed  URINALYSIS, ROUTINE W REFLEX MICROSCOPIC - Abnormal; Notable for the following components:      Result Value   Color, Urine RED (*)    APPearance HAZY (*)    Hgb urine dipstick LARGE (*)    Protein, ur 100 (*)    All other components within normal limits  BASIC METABOLIC PANEL - Abnormal; Notable for the following components:   Glucose, Bld 141 (*)    BUN 28 (*)    Creatinine, Ser 1.61 (*)    GFR, Estimated 42 (*)    All other components within normal limits  CBC - Abnormal; Notable for the following components:   RBC 3.17 (*)    Hemoglobin 9.6 (*)    HCT 30.1 (*)    All other components within normal limits  URINE CULTURE    EKG None  Radiology No results found.  Procedures Procedures    Medications Ordered in ED Medications - No data to display  ED Course/ Medical Decision Making/ A&P                             Medical Decision Making Amount and/or Complexity of Data Reviewed Labs: ordered. ECG/medicine tests: ordered and independent interpretation performed.   87 y.o. M here with urinary retention.  Does have hx prostate cancer, no urology follow-up since 2018 but has required catheter in the past.  Afebrile,  non-toxic.  Appears comfortable with foley in place, draining some bloody urine.  Labs as above-- hemoglobin 9.6 which is improved from prior (has baseline Fe+ deficiency anemia).  SrCr slightly elevated at 1.61, suspect this is likely from retention.  UA with blood but no bacteria seen, 21-50 WBC. Will send for culture.  Will leave foley in place,  have him follow-up with urology.  Return precautions given for any new/acute changes.  Final Clinical Impression(s) / ED Diagnoses Final diagnoses:  Urinary retention    Rx / DC Orders ED Discharge Orders     None         Garlon Hatchet, PA-C 09/16/22 0003    Nira Conn, MD 09/16/22 [phone]

## 2022-09-16 ENCOUNTER — Other Ambulatory Visit: Payer: Self-pay

## 2022-09-16 ENCOUNTER — Inpatient Hospital Stay (HOSPITAL_COMMUNITY)
Admission: EM | Admit: 2022-09-16 | Discharge: 2022-09-19 | DRG: 663 | Disposition: A | Discharge status: Home / Self Care | Payer: Medicare Other | Attending: Internal Medicine | Admitting: Internal Medicine

## 2022-09-16 ENCOUNTER — Emergency Department (HOSPITAL_COMMUNITY): Admission: EM | Admit: 2022-09-16 | Payer: Medicare Other

## 2022-09-16 DIAGNOSIS — Z8673 Personal history of transient ischemic attack (TIA), and cerebral infarction without residual deficits: Secondary | ICD-10-CM

## 2022-09-16 DIAGNOSIS — I10 Essential (primary) hypertension: Secondary | ICD-10-CM | POA: Diagnosis present

## 2022-09-16 DIAGNOSIS — Z955 Presence of coronary angioplasty implant and graft: Secondary | ICD-10-CM

## 2022-09-16 DIAGNOSIS — F32A Depression, unspecified: Secondary | ICD-10-CM | POA: Diagnosis present

## 2022-09-16 DIAGNOSIS — R310 Gross hematuria: Secondary | ICD-10-CM | POA: Diagnosis present

## 2022-09-16 DIAGNOSIS — R339 Retention of urine, unspecified: Principal | ICD-10-CM

## 2022-09-16 DIAGNOSIS — I959 Hypotension, unspecified: Secondary | ICD-10-CM | POA: Diagnosis present

## 2022-09-16 DIAGNOSIS — Z79899 Other long term (current) drug therapy: Secondary | ICD-10-CM

## 2022-09-16 DIAGNOSIS — Z7982 Long term (current) use of aspirin: Secondary | ICD-10-CM

## 2022-09-16 DIAGNOSIS — E119 Type 2 diabetes mellitus without complications: Secondary | ICD-10-CM

## 2022-09-16 DIAGNOSIS — Z951 Presence of aortocoronary bypass graft: Secondary | ICD-10-CM

## 2022-09-16 DIAGNOSIS — I2510 Atherosclerotic heart disease of native coronary artery without angina pectoris: Secondary | ICD-10-CM | POA: Diagnosis present

## 2022-09-16 DIAGNOSIS — R823 Hemoglobinuria: Secondary | ICD-10-CM | POA: Diagnosis not present

## 2022-09-16 DIAGNOSIS — E1122 Type 2 diabetes mellitus with diabetic chronic kidney disease: Secondary | ICD-10-CM | POA: Diagnosis present

## 2022-09-16 DIAGNOSIS — G4733 Obstructive sleep apnea (adult) (pediatric): Secondary | ICD-10-CM | POA: Diagnosis present

## 2022-09-16 DIAGNOSIS — E1140 Type 2 diabetes mellitus with diabetic neuropathy, unspecified: Secondary | ICD-10-CM | POA: Diagnosis present

## 2022-09-16 DIAGNOSIS — Z7984 Long term (current) use of oral hypoglycemic drugs: Secondary | ICD-10-CM

## 2022-09-16 DIAGNOSIS — I5032 Chronic diastolic (congestive) heart failure: Secondary | ICD-10-CM | POA: Diagnosis present

## 2022-09-16 DIAGNOSIS — E1165 Type 2 diabetes mellitus with hyperglycemia: Secondary | ICD-10-CM | POA: Diagnosis present

## 2022-09-16 DIAGNOSIS — I130 Hypertensive heart and chronic kidney disease with heart failure and stage 1 through stage 4 chronic kidney disease, or unspecified chronic kidney disease: Secondary | ICD-10-CM | POA: Diagnosis present

## 2022-09-16 DIAGNOSIS — Z953 Presence of xenogenic heart valve: Secondary | ICD-10-CM

## 2022-09-16 DIAGNOSIS — N179 Acute kidney failure, unspecified: Secondary | ICD-10-CM | POA: Diagnosis present

## 2022-09-16 DIAGNOSIS — Z87442 Personal history of urinary calculi: Secondary | ICD-10-CM

## 2022-09-16 DIAGNOSIS — N182 Chronic kidney disease, stage 2 (mild): Secondary | ICD-10-CM | POA: Diagnosis present

## 2022-09-16 DIAGNOSIS — E785 Hyperlipidemia, unspecified: Secondary | ICD-10-CM | POA: Diagnosis present

## 2022-09-16 DIAGNOSIS — Z85828 Personal history of other malignant neoplasm of skin: Secondary | ICD-10-CM

## 2022-09-16 DIAGNOSIS — Z87891 Personal history of nicotine dependence: Secondary | ICD-10-CM

## 2022-09-16 DIAGNOSIS — Z8249 Family history of ischemic heart disease and other diseases of the circulatory system: Secondary | ICD-10-CM

## 2022-09-16 DIAGNOSIS — N3041 Irradiation cystitis with hematuria: Principal | ICD-10-CM | POA: Diagnosis present

## 2022-09-16 DIAGNOSIS — Z794 Long term (current) use of insulin: Secondary | ICD-10-CM

## 2022-09-16 DIAGNOSIS — Z923 Personal history of irradiation: Secondary | ICD-10-CM

## 2022-09-16 LAB — BASIC METABOLIC PANEL
Anion gap: 12 (ref 5–15)
BUN: 25 mg/dL — ABNORMAL HIGH (ref 8–23)
CO2: 23 mmol/L (ref 22–32)
Calcium: 9.6 mg/dL (ref 8.9–10.3)
Chloride: 102 mmol/L (ref 98–111)
Creatinine, Ser: 1.4 mg/dL — ABNORMAL HIGH (ref 0.61–1.24)
GFR, Estimated: 50 mL/min — ABNORMAL LOW (ref >60)
Glucose, Bld: 155 mg/dL — ABNORMAL HIGH (ref 70–99)
Potassium: 4.4 mmol/L (ref 3.5–5.1)
Sodium: 137 mmol/L (ref 135–145)

## 2022-09-16 LAB — CBC WITH DIFFERENTIAL/PLATELET
Abs Immature Granulocytes: 0.08 10*3/uL — ABNORMAL HIGH (ref 0.00–0.07)
Basophils Absolute: 0 10*3/uL (ref 0.0–0.1)
Basophils Relative: 0 %
Eosinophils Absolute: 0.1 10*3/uL (ref 0.0–0.5)
Eosinophils Relative: 1 %
HCT: 30.4 % — ABNORMAL LOW (ref 39.0–52.0)
Hemoglobin: 9.9 g/dL — ABNORMAL LOW (ref 13.0–17.0)
Immature Granulocytes: 1 %
Lymphocytes Relative: 11 %
Lymphs Abs: 1.5 10*3/uL (ref 0.7–4.0)
MCH: 30.7 pg (ref 26.0–34.0)
MCHC: 32.6 g/dL (ref 30.0–36.0)
MCV: 94.1 fL (ref 80.0–100.0)
Monocytes Absolute: 0.9 10*3/uL (ref 0.1–1.0)
Monocytes Relative: 7 %
Neutro Abs: 11 10*3/uL — ABNORMAL HIGH (ref 1.7–7.7)
Neutrophils Relative %: 80 %
Platelets: 249 10*3/uL (ref 150–400)
RBC: 3.23 MIL/uL — ABNORMAL LOW (ref 4.22–5.81)
RDW: 15.1 % (ref 11.5–15.5)
WBC: 13.6 10*3/uL — ABNORMAL HIGH (ref 4.0–10.5)
nRBC: 0 % (ref 0.0–0.2)

## 2022-09-16 MED ORDER — SODIUM CHLORIDE 0.9 % IR SOLN
3000.0000 mL | Status: DC
  Administered 2022-09-16: 3000 mL

## 2022-09-16 NOTE — ED Notes (Signed)
Patient complained of discomfort and RN noted a lack of drainage to the foley bag. Hand irrigation needed. Numerous clots removed. Post irrigation a pink stream returned to the catheter tubing. Patient reported relief.

## 2022-09-16 NOTE — ED Triage Notes (Signed)
Patient coming to ED for evaluation of problems with foley catheter placed last night for urinary retention.  Reports he is currently having "a lot of leaking from it."  C/o bladder discomfort, pain, and hematuria.  No reports of fevers.

## 2022-09-16 NOTE — ED Provider Notes (Addendum)
[place] EMERGENCY DEPARTMENT AT [facility] Provider Note   CSN: [id number] Arrival date & time: 09/16/22  1830     History  Chief Complaint  Patient presents with   Urinary Retention    Catheter Complications    FLOR RICARTE is a 87 y.o. male.  HPI   87 year old male with medical history significant for CAD, prior stroke, DM Beatties, HTN, HLD, history of prostate cancer who presented to the emergency department last night with a chief complaint of urinary retention, had a Foley catheter placed.  He states that over the course of today he has had a significant amount of bloody output from his catheter.  He has noticed decreased output at to this point where now he is leaking around the catheter.  He is endorsing intermittent suprapubic discomfort.  Home Medications Prior to Admission medications   Medication Sig Start Date End Date Taking? Authorizing Provider  cyanocobalamin 1000 MCG tablet Take 1 tablet (1,000 mcg total) by mouth daily. 07/28/22   Burnadette Pop, MD  ferrous sulfate 325 (65 FE) MG tablet Take 1 tablet (325 mg total) by mouth daily with breakfast. 07/28/22   Burnadette Pop, MD  FLUoxetine (PROZAC) 20 MG capsule Take 20 mg by mouth at bedtime.     [provider]  furosemide (LASIX) 20 MG tablet Take 1 tablet (20 mg total) by mouth daily as needed for fluid. 12/28/16   Allred, Fayrene Fearing, MD  gabapentin (NEURONTIN) 600 MG tablet Take 600 mg by mouth 3 (three) times daily. 07/17/22   [provider]  insulin lispro protamine-lispro (HUMALOG 75/25 MIX) (75-25) 100 UNIT/ML SUSP injection Inject 18 Units into the skin 2 (two) times daily with a meal.    [provider]  losartan (COZAAR) 25 MG tablet Take 1 tablet by mouth once daily 06/28/18   Allred, Fayrene Fearing, MD  Melatonin 3 MG TABS Take 1 tablet by mouth at bedtime.     [provider]  metFORMIN (GLUMETZA) 1000 MG (MOD) 24 hr tablet Take 1 tablet (1,000 mg total) by mouth  2 (two) times daily with a meal. 03/29/12   Arguello, Roger A, PA-C  metoprolol succinate (TOPROL-XL) 50 MG 24 hr tablet Take 1 tablet (50 mg total) by mouth daily. 12/25/16   Allred, Fayrene Fearing, MD  Multiple Vitamin (MULTIVITAMIN) tablet Take 1 tablet by mouth daily.    [provider]  nitroGLYCERIN (NITROSTAT) 0.4 MG SL tablet Place 1 tablet (0.4 mg total) under the tongue every 5 (five) minutes as needed. For chest pain 03/28/12   [name], Deloris Ping, MD  pantoprazole (PROTONIX) 40 MG tablet Take 1 tablet (40 mg total) by mouth daily. 07/27/22 07/27/23  Burnadette Pop, MD  potassium chloride SA (K-DUR,KLOR-CON) 20 MEQ tablet Take 1 tablet (20 mEq total) by mouth daily. Patient not taking: Reported on 07/25/2022 12/20/16   Newman Nip, NP  rosuvastatin (CRESTOR) 10 MG tablet Take 10 mg by mouth every morning.     [provider]  spironolactone (ALDACTONE) 25 MG tablet Take 25 mg by mouth daily.    [provider]  zolpidem (AMBIEN) 10 MG tablet Take 5 mg by mouth at bedtime.    [provider]      Allergies    Patient has no known allergies.    Review of Systems   Review of Systems  All other systems reviewed and are negative.   Physical Exam Updated Vital Signs BP (!) 112/50 (BP Location: Left Arm)  Pulse (!) 58   Temp 98.7 F (37.1 C) (Oral)   Resp 18   SpO2 97%  Physical Exam Vitals and nursing note reviewed.  Constitutional:      General: He is not in acute distress. HENT:     Head: Normocephalic and atraumatic.  Eyes:     Conjunctiva/sclera: Conjunctivae normal.     Pupils: Pupils are equal, round, and reactive to light.  Cardiovascular:     Rate and Rhythm: Normal rate and regular rhythm.  Pulmonary:     Effort: Pulmonary effort is normal. No respiratory distress.  Abdominal:     General: There is no distension.     Tenderness: There is no guarding.  Genitourinary:    Comments: Bright red hematuria present draining from the Foley,  suprapubic tenderness noted, urine and blood draining around the Foley Musculoskeletal:        General: No deformity or signs of injury.     Cervical back: Neck supple.  Skin:    Findings: No lesion or rash.  Neurological:     General: No focal deficit present.     Mental Status: He is alert. Mental status is at baseline.     ED Results / Procedures / Treatments   Labs (all labs ordered are listed, but only abnormal results are displayed) Labs Reviewed  URINALYSIS, W/ REFLEX TO CULTURE (INFECTION SUSPECTED)  CBC WITH DIFFERENTIAL/PLATELET  BASIC METABOLIC PANEL    EKG None  Radiology No results found.  Procedures Procedures    Medications Ordered in ED Medications  sodium chloride irrigation 0.9 % 3,000 mL (3,000 mLs Irrigation New Bag/Given 09/16/22 2211)    ED Course/ Medical Decision Making/ A&P                             Medical Decision Making Amount and/or Complexity of Data Reviewed Labs: ordered.  Risk Prescription drug management.    87 year old male with medical history significant for CAD, prior stroke, DM Beatties, HTN, HLD, history of prostate cancer who presented to the emergency department last night with a chief complaint of urinary retention, had a Foley catheter placed.  He states that over the course of today he has had a significant amount of bloody output from his catheter.  He has noticed decreased output at to this point where now he is leaking around the catheter.  He is endorsing intermittent suprapubic discomfort.  On arrival, the patient was afebrile, vitally stable.  Sinus rhythm noted on cardiac telemetry.  Physical exam concerning for an obstructed Foley catheter.  Nursing was instructed to irrigate the patient's Foley catheter.  Given the concern for obstruction in the setting of hematuria, the patient's Foley was replaced with a three-way catheter and continuous bladder irrigation was initiated.  Urology was consulted.  Discussed  with Dr. Berneice Heinrich, he agreed with a three-way catheter, continuous bladder irrigation until urine is light pink.  If able to irrigate out clots and patient is symptomatically improved, can be discharged with three-way catheter for follow-up with urology.  Labs: Pending at time of signout.  Plan to reassess the patient following labs and CBI, dispo likely home with a 3-way catheter and outpatient Urology follow-up. Signout given to Dr. Blinda Leatherwood at 2330.   Final Clinical Impression(s) / ED Diagnoses Final diagnoses:  Urinary retention  Gross hematuria    Rx / DC Orders ED Discharge Orders     None  Ernie Avena, MD 09/16/22 2310    Ernie Avena, MD 09/16/22 2312

## 2022-09-16 NOTE — ED Notes (Signed)
Bladder scan result: 207 ml

## 2022-09-17 ENCOUNTER — Observation Stay (HOSPITAL_BASED_OUTPATIENT_CLINIC_OR_DEPARTMENT_OTHER): Payer: Medicare Other | Admitting: Certified Registered"

## 2022-09-17 ENCOUNTER — Encounter (HOSPITAL_COMMUNITY): Payer: Self-pay | Admitting: Internal Medicine

## 2022-09-17 ENCOUNTER — Observation Stay (HOSPITAL_COMMUNITY): Payer: Medicare Other | Admitting: Certified Registered"

## 2022-09-17 ENCOUNTER — Encounter (HOSPITAL_COMMUNITY)
Admission: EM | Disposition: A | Discharge status: Home / Self Care | Payer: Self-pay | Source: Home / Self Care | Attending: Internal Medicine

## 2022-09-17 DIAGNOSIS — E785 Hyperlipidemia, unspecified: Secondary | ICD-10-CM | POA: Diagnosis present

## 2022-09-17 DIAGNOSIS — I509 Heart failure, unspecified: Secondary | ICD-10-CM

## 2022-09-17 DIAGNOSIS — Z8673 Personal history of transient ischemic attack (TIA), and cerebral infarction without residual deficits: Secondary | ICD-10-CM | POA: Diagnosis not present

## 2022-09-17 DIAGNOSIS — R310 Gross hematuria: Secondary | ICD-10-CM | POA: Diagnosis present

## 2022-09-17 DIAGNOSIS — Z955 Presence of coronary angioplasty implant and graft: Secondary | ICD-10-CM | POA: Diagnosis not present

## 2022-09-17 DIAGNOSIS — I959 Hypotension, unspecified: Secondary | ICD-10-CM | POA: Diagnosis present

## 2022-09-17 DIAGNOSIS — Z951 Presence of aortocoronary bypass graft: Secondary | ICD-10-CM | POA: Diagnosis not present

## 2022-09-17 DIAGNOSIS — I2510 Atherosclerotic heart disease of native coronary artery without angina pectoris: Secondary | ICD-10-CM | POA: Diagnosis present

## 2022-09-17 DIAGNOSIS — Z85828 Personal history of other malignant neoplasm of skin: Secondary | ICD-10-CM | POA: Diagnosis not present

## 2022-09-17 DIAGNOSIS — I130 Hypertensive heart and chronic kidney disease with heart failure and stage 1 through stage 4 chronic kidney disease, or unspecified chronic kidney disease: Secondary | ICD-10-CM | POA: Diagnosis present

## 2022-09-17 DIAGNOSIS — I110 Hypertensive heart disease with heart failure: Secondary | ICD-10-CM

## 2022-09-17 DIAGNOSIS — E1165 Type 2 diabetes mellitus with hyperglycemia: Secondary | ICD-10-CM | POA: Diagnosis present

## 2022-09-17 DIAGNOSIS — R339 Retention of urine, unspecified: Secondary | ICD-10-CM | POA: Diagnosis present

## 2022-09-17 DIAGNOSIS — Z87891 Personal history of nicotine dependence: Secondary | ICD-10-CM | POA: Diagnosis not present

## 2022-09-17 DIAGNOSIS — N3041 Irradiation cystitis with hematuria: Secondary | ICD-10-CM | POA: Diagnosis present

## 2022-09-17 DIAGNOSIS — E1122 Type 2 diabetes mellitus with diabetic chronic kidney disease: Secondary | ICD-10-CM | POA: Diagnosis present

## 2022-09-17 DIAGNOSIS — N179 Acute kidney failure, unspecified: Secondary | ICD-10-CM | POA: Diagnosis present

## 2022-09-17 DIAGNOSIS — Z8249 Family history of ischemic heart disease and other diseases of the circulatory system: Secondary | ICD-10-CM | POA: Diagnosis not present

## 2022-09-17 DIAGNOSIS — G4733 Obstructive sleep apnea (adult) (pediatric): Secondary | ICD-10-CM | POA: Diagnosis present

## 2022-09-17 DIAGNOSIS — Z87442 Personal history of urinary calculi: Secondary | ICD-10-CM | POA: Diagnosis not present

## 2022-09-17 DIAGNOSIS — Z953 Presence of xenogenic heart valve: Secondary | ICD-10-CM | POA: Diagnosis not present

## 2022-09-17 DIAGNOSIS — Z7984 Long term (current) use of oral hypoglycemic drugs: Secondary | ICD-10-CM | POA: Diagnosis not present

## 2022-09-17 DIAGNOSIS — Z923 Personal history of irradiation: Secondary | ICD-10-CM | POA: Diagnosis not present

## 2022-09-17 DIAGNOSIS — C61 Malignant neoplasm of prostate: Secondary | ICD-10-CM | POA: Diagnosis not present

## 2022-09-17 DIAGNOSIS — N3040 Irradiation cystitis without hematuria: Secondary | ICD-10-CM | POA: Diagnosis not present

## 2022-09-17 DIAGNOSIS — I5032 Chronic diastolic (congestive) heart failure: Secondary | ICD-10-CM | POA: Diagnosis not present

## 2022-09-17 DIAGNOSIS — F32A Depression, unspecified: Secondary | ICD-10-CM | POA: Diagnosis present

## 2022-09-17 DIAGNOSIS — Z794 Long term (current) use of insulin: Secondary | ICD-10-CM | POA: Diagnosis not present

## 2022-09-17 DIAGNOSIS — N182 Chronic kidney disease, stage 2 (mild): Secondary | ICD-10-CM | POA: Diagnosis present

## 2022-09-17 DIAGNOSIS — Z79899 Other long term (current) drug therapy: Secondary | ICD-10-CM | POA: Diagnosis not present

## 2022-09-17 DIAGNOSIS — Z8546 Personal history of malignant neoplasm of prostate: Secondary | ICD-10-CM | POA: Diagnosis not present

## 2022-09-17 DIAGNOSIS — E1140 Type 2 diabetes mellitus with diabetic neuropathy, unspecified: Secondary | ICD-10-CM | POA: Diagnosis present

## 2022-09-17 HISTORY — PX: CYSTOSCOPY WITH FULGERATION: SHX6638

## 2022-09-17 LAB — URINE CULTURE: Culture: NO GROWTH

## 2022-09-17 LAB — HEMOGLOBIN AND HEMATOCRIT, BLOOD
HCT: 28.6 % — ABNORMAL LOW (ref 39.0–52.0)
HCT: 29.4 % — ABNORMAL LOW (ref 39.0–52.0)
HCT: 27.9 % — ABNORMAL LOW (ref 39.0–52.0)
Hemoglobin: 9.1 g/dL — ABNORMAL LOW (ref 13.0–17.0)
Hemoglobin: 9.6 g/dL — ABNORMAL LOW (ref 13.0–17.0)
Hemoglobin: 9.1 g/dL — ABNORMAL LOW (ref 13.0–17.0)

## 2022-09-17 LAB — URINALYSIS, W/ REFLEX TO CULTURE (INFECTION SUSPECTED)
Bilirubin Urine: NEGATIVE
Glucose, UA: NEGATIVE mg/dL
Ketones, ur: NEGATIVE mg/dL
Nitrite: NEGATIVE
Protein, ur: 100 mg/dL — AB
RBC / HPF: >50 RBC/hpf (ref 0–5)
Specific Gravity, Urine: 1.008 (ref 1.005–1.030)
WBC, UA: >50 WBC/hpf (ref 0–5)
pH: 6 (ref 5.0–8.0)

## 2022-09-17 LAB — GLUCOSE, CAPILLARY
Glucose-Capillary: 195 mg/dL — ABNORMAL HIGH (ref 70–99)
Glucose-Capillary: 211 mg/dL — ABNORMAL HIGH (ref 70–99)
Glucose-Capillary: 328 mg/dL — ABNORMAL HIGH (ref 70–99)
Glucose-Capillary: 234 mg/dL — ABNORMAL HIGH (ref 70–99)

## 2022-09-17 LAB — TYPE AND SCREEN
ABO/RH(D): A POS
Antibody Screen: NEGATIVE

## 2022-09-17 LAB — PSA: Prostatic Specific Antigen: <0.01 ng/mL (ref 0.00–4.00)

## 2022-09-17 SURGERY — CYSTOSCOPY, WITH BLADDER FULGURATION
Anesthesia: General | Site: Prostate

## 2022-09-17 MED ORDER — CHLORHEXIDINE GLUCONATE CLOTH 2 % EX PADS
6.0000 | MEDICATED_PAD | Freq: Every day | CUTANEOUS | Status: DC
  Administered 2022-09-18: 6 via TOPICAL

## 2022-09-17 MED ORDER — FLUOXETINE HCL 20 MG PO CAPS
20.0000 mg | ORAL_CAPSULE | Freq: Every day | ORAL | Status: DC
  Administered 2022-09-17 – 2022-09-18 (×2): 20 mg via ORAL
  Filled 2022-09-17 (×2): qty 1

## 2022-09-17 MED ORDER — INSULIN ASPART 100 UNIT/ML IJ SOLN: 0.0000 [IU] | Freq: Three times a day (TID) | INTRAMUSCULAR | Status: DC

## 2022-09-17 MED ORDER — FENTANYL CITRATE (PF) 100 MCG/2ML IJ SOLN
INTRAMUSCULAR | Status: DC | PRN
  Administered 2022-09-17 (×2): 25 ug via INTRAVENOUS
  Administered 2022-09-17: 50 ug via INTRAVENOUS

## 2022-09-17 MED ORDER — DEXAMETHASONE SODIUM PHOSPHATE 10 MG/ML IJ SOLN
INTRAMUSCULAR | Status: DC | PRN
  Administered 2022-09-17: 10 mg via INTRAVENOUS

## 2022-09-17 MED ORDER — ONDANSETRON HCL 4 MG/2ML IJ SOLN
INTRAMUSCULAR | Status: AC
  Filled 2022-09-17: qty 2

## 2022-09-17 MED ORDER — NITROGLYCERIN 0.4 MG SL SUBL: 0.4000 mg | SUBLINGUAL_TABLET | SUBLINGUAL | Status: DC | PRN

## 2022-09-17 MED ORDER — ONDANSETRON HCL 4 MG/2ML IJ SOLN
INTRAMUSCULAR | Status: DC | PRN
  Administered 2022-09-17: 4 mg via INTRAVENOUS

## 2022-09-17 MED ORDER — CEFAZOLIN SODIUM-DEXTROSE 2-4 GM/100ML-% IV SOLN
2.0000 g | Freq: Once | INTRAVENOUS | Status: AC
  Administered 2022-09-17: 2 g via INTRAVENOUS

## 2022-09-17 MED ORDER — PROPOFOL 10 MG/ML IV BOLUS
INTRAVENOUS | Status: AC
  Filled 2022-09-17: qty 20

## 2022-09-17 MED ORDER — ROSUVASTATIN CALCIUM 10 MG PO TABS
10.0000 mg | ORAL_TABLET | Freq: Every day | ORAL | Status: DC
  Administered 2022-09-18 – 2022-09-19 (×2): 10 mg via ORAL
  Filled 2022-09-17 (×2): qty 1

## 2022-09-17 MED ORDER — STERILE WATER FOR IRRIGATION IR SOLN
Status: DC | PRN
  Administered 2022-09-17: 30 mL

## 2022-09-17 MED ORDER — SODIUM CHLORIDE 0.9 % IR SOLN
Status: DC | PRN
  Administered 2022-09-17: 12000 mL

## 2022-09-17 MED ORDER — METFORMIN HCL 500 MG PO TABS: 500.0000 mg | ORAL_TABLET | Freq: Two times a day (BID) | ORAL | Status: DC

## 2022-09-17 MED ORDER — EPHEDRINE SULFATE-NACL 50-0.9 MG/10ML-% IV SOSY
PREFILLED_SYRINGE | INTRAVENOUS | Status: DC | PRN
  Administered 2022-09-17 (×2): 5 mg via INTRAVENOUS

## 2022-09-17 MED ORDER — DEXAMETHASONE SODIUM PHOSPHATE 10 MG/ML IJ SOLN
INTRAMUSCULAR | Status: AC
  Filled 2022-09-17: qty 1

## 2022-09-17 MED ORDER — ACETAMINOPHEN 325 MG PO TABS
650.0000 mg | ORAL_TABLET | Freq: Four times a day (QID) | ORAL | Status: DC | PRN
  Administered 2022-09-17 – 2022-09-19 (×4): 650 mg via ORAL
  Filled 2022-09-17 (×5): qty 2

## 2022-09-17 MED ORDER — ZOLPIDEM TARTRATE 5 MG PO TABS
5.0000 mg | ORAL_TABLET | Freq: Every evening | ORAL | Status: DC | PRN
  Administered 2022-09-17 – 2022-09-19 (×2): 5 mg via ORAL
  Filled 2022-09-17 (×2): qty 1

## 2022-09-17 MED ORDER — ZOLPIDEM TARTRATE 5 MG PO TABS: 5.0000 mg | ORAL_TABLET | Freq: Every day | ORAL | Status: DC

## 2022-09-17 MED ORDER — LACTATED RINGERS IV SOLN: INTRAVENOUS | Status: DC | PRN

## 2022-09-17 MED ORDER — CEFAZOLIN SODIUM-DEXTROSE 2-4 GM/100ML-% IV SOLN
INTRAVENOUS | Status: AC
  Filled 2022-09-17: qty 100

## 2022-09-17 MED ORDER — LIDOCAINE HCL (PF) 2 % IJ SOLN
INTRAMUSCULAR | Status: AC
  Filled 2022-09-17: qty 5

## 2022-09-17 MED ORDER — SPIRONOLACTONE 25 MG PO TABS: 25.0000 mg | ORAL_TABLET | Freq: Every day | ORAL | Status: DC

## 2022-09-17 MED ORDER — SODIUM CHLORIDE 0.9 % IR SOLN
Status: DC | PRN
  Administered 2022-09-17: 1000 mL

## 2022-09-17 MED ORDER — METOPROLOL SUCCINATE ER 100 MG PO TB24
100.0000 mg | ORAL_TABLET | Freq: Every day | ORAL | Status: DC
  Administered 2022-09-17 – 2022-09-18 (×2): 100 mg via ORAL
  Filled 2022-09-17 (×2): qty 1

## 2022-09-17 MED ORDER — OXYCODONE HCL 5 MG/5ML PO SOLN: 5.0000 mg | Freq: Once | ORAL | Status: DC | PRN

## 2022-09-17 MED ORDER — CEFAZOLIN (ANCEF) 1 G IV SOLR
2.0000 g | INTRAVENOUS | Status: DC
  Filled 2022-09-17: qty 2

## 2022-09-17 MED ORDER — FENTANYL CITRATE (PF) 100 MCG/2ML IJ SOLN
INTRAMUSCULAR | Status: AC
  Filled 2022-09-17: qty 2

## 2022-09-17 MED ORDER — PANTOPRAZOLE SODIUM 40 MG PO TBEC
40.0000 mg | DELAYED_RELEASE_TABLET | Freq: Every day | ORAL | Status: DC
  Administered 2022-09-18 – 2022-09-19 (×2): 40 mg via ORAL
  Filled 2022-09-17 (×2): qty 1

## 2022-09-17 MED ORDER — FERROUS SULFATE 325 (65 FE) MG PO TABS
325.0000 mg | ORAL_TABLET | Freq: Every day | ORAL | Status: DC
  Administered 2022-09-18 – 2022-09-19 (×2): 325 mg via ORAL
  Filled 2022-09-17 (×2): qty 1

## 2022-09-17 MED ORDER — FENTANYL CITRATE PF 50 MCG/ML IJ SOSY: 25.0000 ug | PREFILLED_SYRINGE | INTRAMUSCULAR | Status: DC | PRN

## 2022-09-17 MED ORDER — FUROSEMIDE 20 MG PO TABS: 20.0000 mg | ORAL_TABLET | Freq: Every day | ORAL | Status: DC | PRN

## 2022-09-17 MED ORDER — AMISULPRIDE (ANTIEMETIC) 5 MG/2ML IV SOLN: 10.0000 mg | Freq: Once | INTRAVENOUS | Status: DC | PRN

## 2022-09-17 MED ORDER — INSULIN ASPART 100 UNIT/ML IJ SOLN
0.0000 [IU] | Freq: Three times a day (TID) | INTRAMUSCULAR | Status: DC
  Administered 2022-09-17: 11 [IU] via SUBCUTANEOUS

## 2022-09-17 MED ORDER — LIDOCAINE 2% (20 MG/ML) 5 ML SYRINGE
INTRAMUSCULAR | Status: DC | PRN
  Administered 2022-09-17: 100 mg via INTRAVENOUS

## 2022-09-17 MED ORDER — PROPOFOL 10 MG/ML IV BOLUS
INTRAVENOUS | Status: DC | PRN
  Administered 2022-09-17: 110 mg via INTRAVENOUS

## 2022-09-17 MED ORDER — ACETAMINOPHEN 650 MG RE SUPP: 650.0000 mg | Freq: Four times a day (QID) | RECTAL | Status: DC | PRN

## 2022-09-17 MED ORDER — EPHEDRINE 5 MG/ML INJ
INTRAVENOUS | Status: AC
  Filled 2022-09-17: qty 5

## 2022-09-17 MED ORDER — OXYCODONE HCL 5 MG PO TABS: 5.0000 mg | ORAL_TABLET | Freq: Once | ORAL | Status: DC | PRN

## 2022-09-17 SURGICAL SUPPLY — 23 items
BAG DRN RND TRDRP ANRFLXCHMBR (UROLOGICAL SUPPLIES) ×1
BAG URINE DRAIN 2000ML AR STRL (UROLOGICAL SUPPLIES) IMPLANT
BAG URO CATCHER STRL LF (MISCELLANEOUS) ×1 IMPLANT
CATH FOLEY 3WAY 30CC 24FR (CATHETERS) ×1
CATH URTH STD 24FR FL 3W 2 (CATHETERS) IMPLANT
DRAPE FOOT SWITCH (DRAPES) ×1 IMPLANT
ELECT REM PT RETURN 15FT ADLT (MISCELLANEOUS) ×1 IMPLANT
EVACUATOR MICROVAS BLADDER (UROLOGICAL SUPPLIES) IMPLANT
GLOVE BIO SURGEON STRL SZ8 (GLOVE) IMPLANT
GLOVE BIOGEL PI IND STRL 7.5 (GLOVE) IMPLANT
GLOVE BIOGEL PI IND STRL 8 (GLOVE) IMPLANT
GLOVE SURG LX STRL 7.5 STRW (GLOVE) ×1 IMPLANT
GOWN STRL REUS W/ TWL XL LVL3 (GOWN DISPOSABLE) ×1 IMPLANT
GOWN STRL REUS W/TWL XL LVL3 (GOWN DISPOSABLE) ×2
KIT TURNOVER KIT A (KITS) IMPLANT
LOOP CUT BIPOLAR 24F LRG (ELECTROSURGICAL) IMPLANT
MANIFOLD NEPTUNE II (INSTRUMENTS) ×1 IMPLANT
PACK CYSTO (CUSTOM PROCEDURE TRAY) ×1 IMPLANT
SET IRRIG Y TYPE TUR BLADDER L (SET/KITS/TRAYS/PACK) IMPLANT
SYR TOOMEY IRRIG 70ML (MISCELLANEOUS) ×1
SYRINGE TOOMEY IRRIG 70ML (MISCELLANEOUS) IMPLANT
TUBING CONNECTING 10 (TUBING) ×1 IMPLANT
TUBING UROLOGY SET (TUBING) ×1 IMPLANT

## 2022-09-17 NOTE — Progress Notes (Signed)
  Carryover admission to the Day Admitter.  I discussed this case with the EDP, Dr. Blinda Leatherwood.  Per these discussions:   This is a 87 year old male who is being admitted with gross hematuria.  The patient was seen in the ED a day ago with urinary retention and a Foley catheter was placed at this time.  In the interval he is noted persistent gross hematuria prompting him to present back to the emergency department this evening.  Patient was started on continuous bladder irrigation, but persistence of the gross hematuria has been noted in spite of this.  He has a three-way Foley catheter at this point.  Vital signs reported to be stable.  Overnight hemoglobin decreased by less than 1 point.  Unclear if the patient is on any blood thinners.  EDP has discussed case with on-call urology, Dr. Berneice Heinrich, Who will formally consult and see the patient.   I have placed an order for observation to med/tele for further evaluation and management of the above.  I have placed some additional preliminary admit orders via the adult multi-morbid admission order set. I have also ordered n.p.o. in case the patient requires cystoscopy.  Also ordered a repeat H&H to be checked at 9 AM and ordered type and screen as well.    Newton Pigg, DO Hospitalist

## 2022-09-17 NOTE — ED Notes (Addendum)
Patient complain of bladder pain again. RN irrigated bladder. Clots still coming out. Patient relieve after irrigation. Will continue CBI.

## 2022-09-17 NOTE — Anesthesia Postprocedure Evaluation (Signed)
Anesthesia Post Note  Patient: [name]  Procedure(s) Performed: CYSTOSCOPY WITH FULGERATION AND CLOT EVACUATION (Prostate)     Patient location during evaluation: PACU Anesthesia Type: General Level of consciousness: awake Pain management: pain level controlled Vital Signs Assessment: post-procedure vital signs reviewed and stable Respiratory status: spontaneous breathing, nonlabored ventilation and respiratory function stable Cardiovascular status: blood pressure returned to baseline and stable Postop Assessment: no apparent nausea or vomiting Anesthetic complications: no   No notable events documented.  Last Vitals:  Vitals:   09/17/22 1100 09/17/22 1130  BP: 136/65 (!) 145/59  Pulse: 70 66  Resp: 14 18  Temp:    SpO2: 100% 95%    Last Pain:  Vitals:   09/17/22 1130  TempSrc:   PainSc: 0-No pain                 Linton Rump

## 2022-09-17 NOTE — Anesthesia Preprocedure Evaluation (Addendum)
Anesthesia Evaluation  Patient identified by MRN, date of birth, ID band Patient awake    Reviewed: Allergy & Precautions, NPO status , Patient's Chart, lab work & pertinent test results  History of Anesthesia Complications Negative for: history of anesthetic complications  Airway Mallampati: III  TM Distance: >3 FB Neck ROM: Full    Dental  (+) Dental Advisory Given Missing some teeth. Denies loose teeth.:   Pulmonary neg shortness of breath, sleep apnea (does not use CPAP) , neg COPD, neg recent URI, former smoker   Pulmonary exam normal breath sounds clear to auscultation       Cardiovascular hypertension (losartan, metoprolol, spironolactone), Pt. on medications and Pt. on home beta blockers (-) angina + CAD, + Cardiac Stents, + CABG, + Peripheral Vascular Disease and +CHF (EF 55-60%)  + dysrhythmias Atrial Fibrillation + Valvular Problems/Murmurs (s/p MVR, s/p AVR with bioprosthetic valve; mild MS)  Rhythm:Regular Rate:Normal  HLD, carotid artery occlusion s/p endarterectomy 2005  TTE 07/25/2022: IMPRESSIONS    1. Left ventricular ejection fraction, by estimation, is 55 to 60%. The  left ventricle has normal function. The left ventricle has no regional  wall motion abnormalities. Left ventricular diastolic function could not  be evaluated.   2. Right ventricular systolic function is mildly reduced. The right  ventricular size is moderately enlarged. There is moderately elevated  pulmonary artery systolic pressure. The estimated right ventricular  systolic pressure is 54.3 mmHg.   3. Left atrial size was mildly dilated.   4. Right atrial size was mildly dilated.   5. 26 mm annuloplasty ring. MG 9 mm HG @ 77 bpm. Higher than reported at  [facility] 06/05/2022 (4 mmHG @ 69 bpm). Suspect a mild element of stenosis with  the small annuloplasty ring with higher gradient on this study explained  by higher heart  rate/sepsis/anemia.  Would recommend repeat echo after acute illness. The  mitral valve has been repaired/replaced. No evidence of mitral valve  regurgitation. Mild mitral stenosis. The mean mitral valve gradient is 9.0  mmHg with average heart rate of 77  bpm. There is a 28 mm prosthetic annuloplasty ring present in the mitral  position. Procedure Date: 08/2010.   6. Tricuspid valve regurgitation is mild to moderate.   7. 25 mm CE bioprosthetic aortic valve replacement. Vmax 2.6 m/s, MG 17  mmHG, EOA 1.56 cm2. MG 8 mmHG at [facility] (06/05/2022). Gradients could be  higher due to sepsis/anemia. Would recommend repeat echo after acute  illness. The aortic valve has been  repaired/replaced. Aortic valve regurgitation is not visualized. Procedure  Date: 08/2010. Aortic valve area, by VTI measures 1.56 cm. Aortic valve  mean gradient measures 17.0 mmHg. Aortic valve Vmax measures 2.61 m/s.   8. The inferior vena cava is normal in size with greater than 50%  respiratory variability, suggesting right atrial pressure of 3 mmHg.     Neuro/Psych neg Seizures PSYCHIATRIC DISORDERS  Depression     Neuromuscular disease (spinal stenosis) CVA (2014), No Residual Symptoms    GI/Hepatic Neg liver ROS,GERD  Medicated,,  Endo/Other  diabetes, Type 2, Insulin Dependent, Oral Hypoglycemic Agents    Renal/GU Renal disease (stones)   Prostate cancer    Musculoskeletal  (+) Arthritis ,    Abdominal   Peds  Hematology  (+) Blood dyscrasia, anemia   Anesthesia Other Findings Recently admitted 5/6-5/9 for Severe sepsis secondary to bronchitis/acute hypoxic respiratory failure  Reproductive/Obstetrics  Anesthesia Physical Anesthesia Plan  ASA: 3  Anesthesia Plan: General   Post-op Pain Management:    Induction: Intravenous  PONV Risk Score and Plan: 2 and Ondansetron, Dexamethasone and Treatment may vary due to age or medical condition  Airway Management Planned:  LMA  Additional Equipment:   Intra-op Plan:   Post-operative Plan: Extubation in OR  Informed Consent: I have reviewed the patients History and Physical, chart, labs and discussed the procedure including the risks, benefits and alternatives for the proposed anesthesia with the patient or authorized representative who has indicated his/her understanding and acceptance.     Dental advisory given  Plan Discussed with: CRNA and Anesthesiologist  Anesthesia Plan Comments: (Risks of general anesthesia discussed including, but not limited to, sore throat, hoarse voice, chipped/damaged teeth, injury to vocal cords, nausea and vomiting, allergic reactions, lung infection, heart attack, stroke, and death. All questions answered. )        Anesthesia Quick Evaluation

## 2022-09-17 NOTE — ED Notes (Signed)
Patient complain of bladder pain. RN irrigated bladder. Clots noted. Patient relieve after irrigation. Will continue CBI.

## 2022-09-17 NOTE — Brief Op Note (Signed)
09/17/2022  10:41 AM  PATIENT:  [name]  87 y.o. male  PRE-OPERATIVE DIAGNOSIS:  HEMATURIA AND PROSTATE CANCER  POST-OPERATIVE DIAGNOSIS:  * No post-op diagnosis entered *  PROCEDURE:  Procedure(s): CYSTOSCOPY  WITH FULGERATION; CLOT EVACUATION; POSSIBLE TURP (N/A)  SURGEON:  Surgeon(s) and Role:    * Veer Elamin, Delbert Phenix., MD - Primary  PHYSICIAN ASSISTANT:   ASSISTANTS: none   ANESTHESIA:   general  EBL:  minimal   BLOOD ADMINISTERED:none  DRAINS:  3 way foley to NS irrigation    LOCAL MEDICATIONS USED:  NONE  SPECIMEN:  No Specimen  DISPOSITION OF SPECIMEN:  N/A  COUNTS:  YES  TOURNIQUET:  * No tourniquets in log *  DICTATION: .Other Dictation: Dictation Number [id number]  PLAN OF CARE: Admit to inpatient   PATIENT DISPOSITION:  PACU - hemodynamically stable.   Delay start of Pharmacological VTE agent (>24hrs) due to surgical blood loss or risk of bleeding: yes

## 2022-09-17 NOTE — Transfer of Care (Signed)
Immediate Anesthesia Transfer of Care Note  Patient: [name]  Procedure(s) Performed: CYSTOSCOPY  WITH FULGERATION; CLOT EVACUATION; POSSIBLE TURP  Patient Location: PACU  Anesthesia Type:General  Level of Consciousness: awake, alert , and oriented  Airway & Oxygen Therapy: Patient Spontanous Breathing and Patient connected to face mask oxygen  Post-op Assessment: Report given to RN and Post -op Vital signs reviewed and stable  Post vital signs: Reviewed and stable  Last Vitals:  Vitals Value Taken Time  BP 94/70 09/17/22 1049  Temp    Pulse 71 09/17/22 1051  Resp 16 09/17/22 1051  SpO2 100 % 09/17/22 1051  Vitals shown include unvalidated device data.  Last Pain:  Vitals:   09/17/22 0645  TempSrc: Oral  PainSc:          Complications: No notable events documented.

## 2022-09-17 NOTE — ED Notes (Signed)
Patient complain of bladder pain and pressure. RN irrigated bladder. A lot of Clots noted. Will continue CBI.

## 2022-09-17 NOTE — Op Note (Signed)
NAME: [name], [name] MEDICAL RECORD NO: [id number] ACCOUNT NO: [id number] DATE OF BIRTH: [birth year] FACILITY: [facility] LOCATION: [id number] PHYSICIAN: Sebastian Ache, MD  Operative Report   DATE OF PROCEDURE: 09/17/2022  PREOPERATIVE DIAGNOSIS:  History of prostate cancer, gross hematuria with clot retention.  PROCEDURE:  Cystoscopy with clot evacuation and fulguration of bleeders.  ESTIMATED BLOOD LOSS:  Approximately 250 mL of old blood and minimal new blood.  FINDINGS:   1.  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 2.  Approximately 250 mL of old clot. 3.  Excellent hemostasis following clot evacuation and fulguration. 4.  Wide open prostate fossa, evidence of continued good TURP defect.  INDICATIONS:  The patient is a pleasant 87 year old man with significant cardiovascular comorbidity and remote history of prostate cancer treated with radiation 2017, who presented with a very large hematuria and clots with frank retention.  ER staff did  a very commendable job at attempt at bladder irrigation alone.  However, his clots remain problematic overnight on irrigation.  He was evaluated this morning and counseled towards path of operative cystoscopy with clot evacuation and fulguration for  diagnostic and therapeutic intent.  He does have a recent CT, which rules out any upper tract lesions or stones.  He wished to proceed.  Informed consent was obtained and placed in medical record.  PROCEDURE IN DETAIL:  The patient being identified and verified. Procedure being cystoscopy with clot evacuation, fulguration was confirmed.  Procedure timeout was performed.  Intravenous antibiotics were administered.  General anesthesia was induced.   The patient was placed into a low lithotomy position.  Sterile field was created, prepped and draped the patient's penis, perineum, and proximal thighs using iodine after his in situ Foley catheter  was removed.  Cystourethroscopy was performed using  26-French resectoscope sheath with visual obturator.  Inspection of the anterior and posterior urethra revealed some fixation of the area of the prostate consistent with known radiation.  There was some friability of the prostatic fossa, but the caliber  was quite open estimated to be approximately 28-French.  There was minimal bleeding from the prostate fossa.  Inspection of the urinary bladder did reveal significant amount of old formed clot.  This was irrigated with a Toomey syringe approximately 250  mL of old formed clot was removed.  Following this, a better visualization of the bladder this did revealed multifocal radiation cystitis changes, mostly at the dome and at the trigone area.  There was a dominant focus that was actively bleeding lateral  to the left ureteral orifice.  All these foci were fulgurated using coagulation current and the resectoscope loop resulted in excellent hemostasis.  The prostate fossa was also fulgurated to minimize bleeding from there.  Cystoscope was exchanged for a  24-French 3-way Foley catheter 20 mL of water in the balloon.  Normal saline irrigation with efflux of urine was clear.  Procedure was terminated.  The patient tolerated the procedure well, no immediate perioperative complications.  The patient was taken  to postanesthesia care unit in stable condition.  Plan for inpatient admission.  Hopefully, we can wean him from his bladder irrigation and perform trial of void in-house.   PUS D: 09/17/2022 10:46:01 am T: 09/17/2022 2:42:00 pm  JOB: [id number]/ [id number]

## 2022-09-17 NOTE — H&P (Signed)
History and Physical    Patient: [name] WUJ:[id number] DOB: May 23, 1935 DOA: 09/16/2022 DOS: the patient was seen and examined on 09/17/2022 PCP: Renford Dills, MD  Patient coming from: Home  Chief Complaint:  Chief Complaint  Patient presents with   Urinary Retention    Catheter Complications   HPI: [name] is a 87 y.o. male with medical history significant of normocytic anemia, atrial flutter, bladder outlet obstruction, carotid artery obstruction, CAD, degenerative disc disease, GERD, hyperlipidemia, hypertension, kidney stones, major depression, OSA not on CPAP, prostate cancer, mitral valve replacement with bioprosthetic valve, skin cancer of the nose, skin cancer of the trunk, spinal stenosis, history of nonhemorrhagic CVA with no residual deficits, type 2 diabetes mellitus who presented to the emergency department for the second day in a row with with complaints of urinary retention and gross hematuria. He denied fever, chills, rhinorrhea, sore throat, wheezing or hemoptysis.  No chest pain, palpitations, diaphoresis, PND, orthopnea or pitting edema of the lower extremities.  No abdominal pain, nausea, emesis, diarrhea, constipation, melena or hematochezia.  No flank pain, dysuria or frequency.  No polyuria, polydipsia, polyphagia or blurred vision.   Lab work: CBC showed a white count of 13.6, Monnin 9.9 g deciliter platelets 249.  Urinalysis was red, cloudy, moderate hemoglobinuria, protein of 100 mg/dL and trace leukocyte esterase.  Microscopic examination showed rare bacteria, more than 50 RBC and more than 50 WBC.  BMP showed normal electrolytes.  Glucose 155, BUN 25 and creatinine 1.40 mg/dL.  ED course: Initial vital signs were temperature 98.7 F, pulse 58, respiration 18, BP 112/50 mmHg O2 sat 97% on room air.  The patient was taken to the OR by Dr. Berneice Heinrich for cystoscopy this morning.  Review of Systems: As mentioned in the history of present illness. All other  systems reviewed and are negative.  Past Medical History:  Diagnosis Date   Anemia    Anginal pain (HCC)    Atrial flutter (HCC)    Bladder outlet obstruction    Carotid artery occlusion    Coronary artery disease    post stents; prior CABG; s/p cath January 2014 with 2 VD, patent SVG to OM3 and patent SVG to PL patent   Degenerative disk disease    GERD (gastroesophageal reflux disease)    History of esophageal stricture    "has it stretched q once in awhile" (03/26/2012"   Hyperlipidemia    Hypertension    Kidney stones    Major depression    OSA (obstructive sleep apnea)    "have a mask; haven't used one in years" (03/26/2012)   Prostate cancer (HCC)    S/P mitral valve replacement with bioprosthetic valve 02/20/2014   Skin cancer of nose 2012   "right side" (03/26/2012)   Skin cancer of trunk    "left chest" (03/26/2012)   Spinal stenosis    Stroke (HCC) 2014   NO RESIDUAL PROBLEMS   Type II diabetes mellitus (HCC)    Valvular heart disease    Past Surgical History:  Procedure Laterality Date   A-FLUTTER ABLATION N/A 12/01/2016   Procedure: A-Flutter Ablation;  Surgeon: Hillis Range, MD;  Location: [facility];  Service: Cardiovascular;  Laterality: N/A;   ADENOIDECTOMY  1956   ANTERIOR CERVICAL DECOMP/DISCECTOMY FUSION  2000's   AORTIC VALVE REPLACEMENT  08/29/2010   25 mm CE Magna bioprosthetic - [facility]   APPENDECTOMY  1951   CARDIAC CATHETERIZATION  10/20/2003   Est. EF of 65% --  Critical disease in the mid to distal circumflex --  Preserved left ventricular  function --    CARDIAC VALVE REPLACEMENT     CAROTID ENDARTERECTOMY  05/05/2003   right carotid endarterectomy   CERVICAL DISC SURGERY  1980's   CORONARY ANGIOPLASTY WITH STENT PLACEMENT  10/20/2003   Percutaneous coronary intervention/drug-eluding stent implantation, third marginal branch --  Percutaneous closure, right femoral artery -- Atherosclerotic coronary vascular disease, single vessel / Status post  successful percutaneous coronary intervention/tandem drug -- eluding stent implantation proximal third marginal branch -- Typical angina was not reproduced with device insertion or balloon    CORONARY ARTERY BYPASS GRAFT  08/29/2010   CABG X2; SVG to PDA, SVG to OM2- [facility]   ESOPHAGOGASTRODUODENOSCOPY (EGD) WITH PROPOFOL N/A 07/27/2022   Procedure: ESOPHAGOGASTRODUODENOSCOPY (EGD) WITH PROPOFOL;  Surgeon: Vida Rigger, MD;  Location: [facility] ENDOSCOPY;  Service: Gastroenterology;  Laterality: N/A;   HEMATOMA EVACUATION  05/09/2003   Evacuation of hematoma, right neck   LEFT HEART CATHETERIZATION WITH CORONARY ANGIOGRAM N/A 03/27/2012   Procedure: LEFT HEART CATHETERIZATION WITH CORONARY ANGIOGRAM;  Surgeon: Peter M Swaziland, MD;  Location: [facility] CATH LAB;  Service: Cardiovascular;  Laterality: N/A;   LUMBAR DISC SURGERY  1996   MITRAL VALVE REPAIR  08/23/2010   28 mm Simulus ring -[facility]   POSTERIOR FUSION LUMBAR SPINE  1997   PROSTATE BIOPSY     SHOULDER ARTHROSCOPY  01/07/2007   Left shoulder impingement, labral tear   SKIN CANCER EXCISION  1990's; 2012   "left chest & right nose" (03/26/2012)   TEE WITHOUT CARDIOVERSION N/A 05/15/2012   Procedure: TRANSESOPHAGEAL ECHOCARDIOGRAM (TEE);  Surgeon: Vesta Mixer, MD;  Location: [facility] ENDOSCOPY;  Service: Cardiovascular;  Laterality: N/A;   TEE WITHOUT CARDIOVERSION N/A 12/01/2016   Procedure: TRANSESOPHAGEAL ECHOCARDIOGRAM (TEE);  Surgeon: Pricilla Riffle, MD;  Location: [facility] ENDOSCOPY;  Service: Cardiovascular;  Laterality: N/A;   TONSILLECTOMY AND ADENOIDECTOMY  1956   TRANSURETHRAL RESECTION OF PROSTATE N/A 04/26/2015   Procedure: TRANSURETHRAL RESECTION OF THE PROSTATE WITH GYRUS INSTRUMENTS;  Surgeon: Crist Fat, MD;  Location: [facility] ORS;  Service: Urology;  Laterality: N/A;  TURP-BIPOLAR   Social History:  reports that he quit smoking about 43 years ago. His smoking use included cigarettes. He has a 115.00 pack-year smoking history. He has never used  smokeless tobacco. He reports that he does not drink alcohol and does not use drugs.  No Known Allergies  Family History  Problem Relation Age of Onset   Diabetes Mother    Coronary artery disease Mother    Heart disease Mother    Stroke Father    Cancer Father        prostate   Cancer Daughter        lymphoma hodgkins    Seizures Daughter     Prior to Admission medications   Medication Sig Start Date End Date Taking? Authorizing Provider  aspirin EC 325 MG tablet Take 325 mg by mouth daily.   Yes [provider]  ferrous sulfate 325 (65 FE) MG tablet Take 1 tablet (325 mg total) by mouth daily with breakfast. 07/28/22  Yes Adhikari, Amrit, MD  FLUoxetine (PROZAC) 20 MG capsule Take 20 mg by mouth at bedtime.    Yes [provider]  furosemide (LASIX) 20 MG tablet Take 1 tablet (20 mg total) by mouth daily as needed for fluid. 12/28/16  Yes Allred, Fayrene Fearing, MD  gabapentin (NEURONTIN) 600 MG tablet Take 600 mg by mouth daily.  07/17/22  Yes [provider]  insulin lispro protamine-lispro (HUMALOG 75/25 MIX) (75-25) 100 UNIT/ML SUSP injection Inject 18 Units into the skin 2 (two) times daily with a meal.   Yes [provider]  losartan (COZAAR) 50 MG tablet Take 50 mg by mouth daily.   Yes [provider]  Melatonin 3 MG TABS Take 1 tablet by mouth at bedtime.    Yes [provider]  metFORMIN (GLUCOPHAGE) 500 MG tablet Take 500 mg by mouth 2 (two) times daily with a meal.   Yes [provider]  metoprolol succinate (TOPROL-XL) 100 MG 24 hr tablet Take 100 mg by mouth at bedtime.   Yes [provider]  Multiple Vitamin (MULTIVITAMIN) tablet Take 1 tablet by mouth daily.   Yes [provider]  nitroGLYCERIN (NITROSTAT) 0.4 MG SL tablet Place 1 tablet (0.4 mg total) under the tongue every 5 (five) minutes as needed. For chest pain 03/28/12  Yes [name], Deloris Ping, MD  rosuvastatin (CRESTOR) 10 MG tablet Take 10 mg by  mouth every morning.    Yes [provider]  spironolactone (ALDACTONE) 25 MG tablet Take 25 mg by mouth daily.   Yes [provider]  zolpidem (AMBIEN) 10 MG tablet Take 5 mg by mouth at bedtime.   Yes [provider]  cyanocobalamin 1000 MCG tablet Take 1 tablet (1,000 mcg total) by mouth daily. Patient not taking: Reported on 09/17/2022 07/28/22   Burnadette Pop, MD  losartan (COZAAR) 25 MG tablet Take 1 tablet by mouth once daily Patient not taking: Reported on 09/17/2022 06/28/18   Allred, Fayrene Fearing, MD  metFORMIN (GLUMETZA) 1000 MG (MOD) 24 hr tablet Take 1 tablet (1,000 mg total) by mouth 2 (two) times daily with a meal. Patient not taking: Reported on 09/17/2022 03/29/12   Odella Aquas A, PA-C  metoprolol succinate (TOPROL-XL) 50 MG 24 hr tablet Take 1 tablet (50 mg total) by mouth daily. Patient not taking: Reported on 09/17/2022 12/25/16   Hillis Range, MD  pantoprazole (PROTONIX) 40 MG tablet Take 1 tablet (40 mg total) by mouth daily. Patient not taking: Reported on 09/17/2022 07/27/22 07/27/23  Burnadette Pop, MD  potassium chloride SA (K-DUR,KLOR-CON) 20 MEQ tablet Take 1 tablet (20 mEq total) by mouth daily. Patient not taking: Reported on 07/25/2022 12/20/16   Newman Nip, NP    Physical Exam: Vitals:   09/16/22 1904 09/17/22 0010 09/17/22 0200 09/17/22 0230  BP: (!) 112/50 (!) 147/57 (!) 140/63 (!) 141/59  Pulse: (!) 58 (!) 57 (!) 55 (!) 59  Resp: 18 18  16   Temp: 98.7 F (37.1 C) 98.1 F (36.7 C)  98 F (36.7 C)  TempSrc: Oral Oral    SpO2: 97% 99% 97% 98%   Physical Exam Vitals and nursing note reviewed.  Constitutional:      General: He is awake. He is not in acute distress.    Appearance: Normal appearance.  HENT:     Head: Normocephalic.     Nose: No rhinorrhea.     Mouth/Throat:     Mouth: Mucous membranes are moist.  Eyes:     General: No scleral icterus.    Pupils: Pupils are equal, round, and reactive to light.  Neck:      Vascular: No JVD.  Cardiovascular:     Rate and Rhythm: Regular rhythm. Bradycardia present.     Heart sounds: S1 normal and S2 normal.  Pulmonary:     Effort: Pulmonary effort is normal.  Breath sounds: Normal breath sounds.  Abdominal:     General: Bowel sounds are normal. There is no distension.     Palpations: Abdomen is soft.     Tenderness: There is no abdominal tenderness.  Genitourinary:    Comments: Foley catheter in place. Musculoskeletal:     Cervical back: Neck supple.     Right lower leg: No edema.     Left lower leg: No edema.  Skin:    General: Skin is warm and dry.  Neurological:     General: No focal deficit present.     Mental Status: He is alert and oriented to person, place, and time.  Psychiatric:        Mood and Affect: Mood normal.        Behavior: Behavior normal. Behavior is cooperative.     Data Reviewed:  There are no new results to review at this time.  07/25/2022 IMPRESSION:    1. Left ventricular ejection fraction, by estimation, is 55 to 60%. The  left ventricle has normal function. The left ventricle has no regional  wall motion abnormalities. Left ventricular diastolic function could not  be evaluated.   2. Right ventricular systolic function is mildly reduced. The right  ventricular size is moderately enlarged. There is moderately elevated  pulmonary artery systolic pressure. The estimated right ventricular  systolic pressure is 54.3 mmHg.   3. Left atrial size was mildly dilated.   4. Right atrial size was mildly dilated.   5. 26 mm annuloplasty ring. MG 9 mm HG @ 77 bpm. Higher than reported at  [facility] 06/05/2022 (4 mmHG @ 69 bpm). Suspect a mild element of stenosis with  the small annuloplasty ring with higher gradient on this study explained  by higher heart  rate/sepsis/anemia. Would recommend repeat echo after acute illness. The  mitral valve has been repaired/replaced. No evidence of mitral valve  regurgitation. Mild mitral  stenosis. The mean mitral valve gradient is 9.0  mmHg with average heart rate of 77  bpm. There is a 28 mm prosthetic annuloplasty ring present in the mitral  position. Procedure Date: 08/2010.   6. Tricuspid valve regurgitation is mild to moderate.   7. 25 mm CE bioprosthetic aortic valve replacement. Vmax 2.6 m/s, MG 17  mmHG, EOA 1.56 cm2. MG 8 mmHG at [facility] (06/05/2022). Gradients could be  higher due to sepsis/anemia. Would recommend repeat echo after acute  illness. The aortic valve has been  repaired/replaced. Aortic valve regurgitation is not visualized. Procedure  Date: 08/2010. Aortic valve area, by VTI measures 1.56 cm. Aortic valve  mean gradient measures 17.0 mmHg. Aortic valve Vmax measures 2.61 m/s.   8. The inferior vena cava is normal in size with greater than 50%  respiratory variability, suggesting right atrial pressure of 3 mmHg.   Comparison(s): Prior images unable to be directly viewed, comparison made  by report only. Changes from prior study are noted. Gradients higher  across Aoritc prosthesis and MV repair compared with [facility] study 06/05/2022.   Assessment and Plan: Principal Problem:   Gross hematuria Status post cystoscopy. Telemetry/inpatient. Continue CBI. Analgesics as needed. Antiemetics as needed. Monitor hematocrit and hemoglobin. Transfuse as needed. Urology consult and procedure appreciated.  Active Problems:   CAD (coronary artery disease) Continue metoprolol and statin. Will hold aspirin for today. Resume aspirin in a.m. if urology agrees.    Chronic heart failure with preserved ejection fraction (HCC) No signs of volume overload. Continue metoprolol 100 mg p.o. bedtime. Continue  spironolactone 25 mg p.o. bedtime. Continue furosemide 20 mg p.o. daily as needed.    Essential hypertension Continue metoprolol and diuretics as above. Monitor BP, HR, renal function electrolytes.    DM2 (diabetes mellitus, type 2) (HCC) Carbohydrate modified  diet. CBG monitoring before meals and bedtime. Continue metformin 500 mg p.o. twice daily.    Hyperlipidemia Continue rosuvastatin 10 mg p.o. daily.    Depression Continue fluoxetine 20 mg p.o. bedtime. Continuous albumin 5 mg p.o. bedtime as needed.      Advance Care Planning:   Code Status: Full Code   Consults: Urology Sebastian Ache, MD).  Family Communication:   Severity of Illness: The appropriate patient status for this patient is OBSERVATION. Observation status is judged to be reasonable and necessary in order to provide the required intensity of service to ensure the patient's safety. The patient's presenting symptoms, physical exam findings, and initial radiographic and laboratory data in the context of their medical condition is felt to place them at decreased risk for further clinical deterioration. Furthermore, it is anticipated that the patient will be medically stable for discharge from the hospital within 2 midnights of admission.   Author: Bobette Mo, MD 09/17/2022 6:28 AM  For on call review [email or web address].   This document was prepared using Dragon voice recognition software and may contain some unintended transcription errors.

## 2022-09-17 NOTE — ED Provider Notes (Signed)
Patient signed out to me by Dr. Karene Fry.  Patient had initially presented to the emergency department 1 day ago with urinary retention.  He had a Foley catheter placed.  He returns tonight because the catheter was blocked with clots.  Patient has been here through the night.  He has been on continuous bladder irrigation.  Urine has become pink with full irrigation, however intermittently it becomes blocked with clots.  Additionally if the bladder irrigation is slowed down, urine becomes grossly blood.  As patient has not cleared with irrigation, will admit to the hospital.  Discussed with Dr. Berneice Heinrich, he will see him in consultation.  Will admit to hospitalist.   Gilda Crease, MD 09/17/22 [phone]

## 2022-09-17 NOTE — Anesthesia Procedure Notes (Signed)
Procedure Name: LMA Insertion Date/Time: 09/17/2022 10:08 AM  Performed by: Myrtie Leuthold D, CRNAPre-anesthesia Checklist: Patient identified, Emergency Drugs available, Suction available and Patient being monitored Patient Re-evaluated:Patient Re-evaluated prior to induction Oxygen Delivery Method: Circle system utilized Preoxygenation: Pre-oxygenation with 100% oxygen Induction Type: IV induction Ventilation: Mask ventilation without difficulty LMA: LMA inserted LMA Size: 4.0 Tube type: Oral Number of attempts: 1 Placement Confirmation: positive ETCO2 and breath sounds checked- equal and bilateral Tube secured with: Tape Dental Injury: Teeth and Oropharynx as per pre-operative assessment

## 2022-09-17 NOTE — Consult Note (Signed)
Reason for Consult: Gross Hematuria / Clot Retention, High Risk Prostate Cancer  Referring Physician: Sanda Klein MD  [name] is an 87 y.o. male.   HPI:   1 - Gross Hematuria / Clot Retention - new gross hematuria with clots. H/o prostate radiation and TURP. Catheter placed in ER on irrigation, but clots still problematic. Hgb 12 to 9 over night. CT 08/2022 w/o stones or overt GU masses.   2 - High Risk Prostate Cancer - s/p external beam radiation + 15mos androgen deprivation 2017 for grade 5 prostate cancer. 2021 - PSA <.01 2024 - PSA (drawn and pending)  PMH sig for CAD/CABG/AVR (follows Dr. Cecille Rubin cards, ASA only), Carotic endarderectomy, ortho surgery. His PCP is Trula Slade MD.  Today "[name]" is seen in consultation for above. Gross hematuria with clots that remain problematic and some Hgb drop overnight.   Past Medical History:  Diagnosis Date   Anemia    Anginal pain (HCC)    Atrial flutter (HCC)    Bladder outlet obstruction    Carotid artery occlusion    Coronary artery disease    post stents; prior CABG; s/p cath January 2014 with 2 VD, patent SVG to OM3 and patent SVG to PL patent   Degenerative disk disease    GERD (gastroesophageal reflux disease)    History of esophageal stricture    "has it stretched q once in awhile" (03/26/2012"   Hyperlipidemia    Hypertension    Kidney stones    Major depression    OSA (obstructive sleep apnea)    "have a mask; haven't used one in years" (03/26/2012)   Prostate cancer (HCC)    S/P mitral valve replacement with bioprosthetic valve 02/20/2014   Skin cancer of nose 2012   "right side" (03/26/2012)   Skin cancer of trunk    "left chest" (03/26/2012)   Spinal stenosis    Stroke (HCC) 2014   NO RESIDUAL PROBLEMS   Type II diabetes mellitus (HCC)    Valvular heart disease     Past Surgical History:  Procedure Laterality Date   A-FLUTTER ABLATION N/A 12/01/2016   Procedure: A-Flutter Ablation;  Surgeon: Hillis Range, MD;  Location: [facility] INVASIVE CV LAB;  Service: Cardiovascular;  Laterality: N/A;   ADENOIDECTOMY  1956   ANTERIOR CERVICAL DECOMP/DISCECTOMY FUSION  2000's   AORTIC VALVE REPLACEMENT  08/29/2010   25 mm CE Magna bioprosthetic - [facility]   APPENDECTOMY  1951   CARDIAC CATHETERIZATION  10/20/2003   Est. EF of 65% -- Critical disease in the mid to distal circumflex --  Preserved left ventricular  function --    CARDIAC VALVE REPLACEMENT     CAROTID ENDARTERECTOMY  05/05/2003   right carotid endarterectomy   CERVICAL DISC SURGERY  1980's   CORONARY ANGIOPLASTY WITH STENT PLACEMENT  10/20/2003   Percutaneous coronary intervention/drug-eluding stent implantation, third marginal branch --  Percutaneous closure, right femoral artery -- Atherosclerotic coronary vascular disease, single vessel / Status post successful percutaneous coronary intervention/tandem drug -- eluding stent implantation proximal third marginal branch -- Typical angina was not reproduced with device insertion or balloon    CORONARY ARTERY BYPASS GRAFT  08/29/2010   CABG X2; SVG to PDA, SVG to OM2- [facility]   ESOPHAGOGASTRODUODENOSCOPY (EGD) WITH PROPOFOL N/A 07/27/2022   Procedure: ESOPHAGOGASTRODUODENOSCOPY (EGD) WITH PROPOFOL;  Surgeon: Vida Rigger, MD;  Location: [facility] ENDOSCOPY;  Service: Gastroenterology;  Laterality: N/A;   HEMATOMA EVACUATION  05/09/2003   Evacuation of hematoma,  right neck   LEFT HEART CATHETERIZATION WITH CORONARY ANGIOGRAM N/A 03/27/2012   Procedure: LEFT HEART CATHETERIZATION WITH CORONARY ANGIOGRAM;  Surgeon: Peter M Swaziland, MD;  Location: [facility] CATH LAB;  Service: Cardiovascular;  Laterality: N/A;   LUMBAR DISC SURGERY  1996   MITRAL VALVE REPAIR  08/23/2010   28 mm Simulus ring -[facility]   POSTERIOR FUSION LUMBAR SPINE  1997   PROSTATE BIOPSY     SHOULDER ARTHROSCOPY  01/07/2007   Left shoulder impingement, labral tear   SKIN CANCER EXCISION  1990's; 2012   "left chest & right nose" (03/26/2012)   TEE WITHOUT  CARDIOVERSION N/A 05/15/2012   Procedure: TRANSESOPHAGEAL ECHOCARDIOGRAM (TEE);  Surgeon: Vesta Mixer, MD;  Location: [facility] ENDOSCOPY;  Service: Cardiovascular;  Laterality: N/A;   TEE WITHOUT CARDIOVERSION N/A 12/01/2016   Procedure: TRANSESOPHAGEAL ECHOCARDIOGRAM (TEE);  Surgeon: Pricilla Riffle, MD;  Location: [facility] ENDOSCOPY;  Service: Cardiovascular;  Laterality: N/A;   TONSILLECTOMY AND ADENOIDECTOMY  1956   TRANSURETHRAL RESECTION OF PROSTATE N/A 04/26/2015   Procedure: TRANSURETHRAL RESECTION OF THE PROSTATE WITH GYRUS INSTRUMENTS;  Surgeon: Crist Fat, MD;  Location: [facility] ORS;  Service: Urology;  Laterality: N/A;  TURP-BIPOLAR    Family History  Problem Relation Age of Onset   Diabetes Mother    Coronary artery disease Mother    Heart disease Mother    Stroke Father    Cancer Father        prostate   Cancer Daughter        lymphoma hodgkins    Seizures Daughter     Social History:  reports that he quit smoking about 43 years ago. His smoking use included cigarettes. He has a 115.00 pack-year smoking history. He has never used smokeless tobacco. He reports that he does not drink alcohol and does not use drugs.  Allergies: No Known Allergies  Medications: I have reviewed the patient's current medications.  Results for orders placed or performed during the hospital encounter of 09/16/22 (from the past 48 hour(s))  CBC with Differential     Status: Abnormal   Collection Time: 09/16/22  8:23 PM  Result Value Ref Range   WBC 13.6 (H) 4.0 - 10.5 K/uL   RBC 3.23 (L) 4.22 - 5.81 MIL/uL   Hemoglobin 9.9 (L) 13.0 - 17.0 g/dL   HCT 16.1 (L) 09.6 - 04.5 %   MCV 94.1 80.0 - 100.0 fL   MCH 30.7 26.0 - 34.0 pg   MCHC 32.6 30.0 - 36.0 g/dL   RDW 40.9 81.1 - 91.4 %   Platelets 249 150 - 400 K/uL   nRBC 0.0 0.0 - 0.2 %   Neutrophils Relative % 80 %   Neutro Abs 11.0 (H) 1.7 - 7.7 K/uL   Lymphocytes Relative 11 %   Lymphs Abs 1.5 0.7 - 4.0 K/uL   Monocytes Relative 7 %   Monocytes  Absolute 0.9 0.1 - 1.0 K/uL   Eosinophils Relative 1 %   Eosinophils Absolute 0.1 0.0 - 0.5 K/uL   Basophils Relative 0 %   Basophils Absolute 0.0 0.0 - 0.1 K/uL   Immature Granulocytes 1 %   Abs Immature Granulocytes 0.08 (H) 0.00 - 0.07 K/uL    Comment: Performed at [facility], [address] [place], [place] [address]  Basic metabolic panel     Status: Abnormal   Collection Time: 09/16/22  8:23 PM  Result Value Ref Range   Sodium 137 135 - 145 mmol/L  Potassium 4.4 3.5 - 5.1 mmol/L   Chloride 102 98 - 111 mmol/L   CO2 23 22 - 32 mmol/L   Glucose, Bld 155 (H) 70 - 99 mg/dL    Comment: Glucose reference range applies only to samples taken after fasting for at least 8 hours.   BUN 25 (H) 8 - 23 mg/dL   Creatinine, Ser 1.19 (H) 0.61 - 1.24 mg/dL   Calcium 9.6 8.9 - 14.7 mg/dL   GFR, Estimated 50 (L) >60 mL/min    Comment: (NOTE) Calculated using the CKD-EPI Creatinine Equation (2021)    Anion gap 12 5 - 15    Comment: Performed at [facility], [address], [place], [place] [address]  Urinalysis, w/ Reflex to Culture (Infection Suspected) -Urine, Clean Catch     Status: Abnormal   Collection Time: 09/16/22 10:59 PM  Result Value Ref Range   Specimen Source URINE, CLEAN CATCH    Color, Urine RED (A) YELLOW   APPearance CLOUDY (A) CLEAR   Specific Gravity, Urine 1.008 1.005 - 1.030   pH 6.0 5.0 - 8.0   Glucose, UA NEGATIVE NEGATIVE mg/dL   Hgb urine dipstick MODERATE (A) NEGATIVE   Bilirubin Urine NEGATIVE NEGATIVE   Ketones, ur NEGATIVE NEGATIVE mg/dL   Protein, ur 213 (A) NEGATIVE mg/dL   Nitrite NEGATIVE NEGATIVE   Leukocytes,Ua TRACE (A) NEGATIVE   RBC / HPF >50 0 - 5 RBC/hpf   WBC, UA >50 0 - 5 WBC/hpf    Comment:        Reflex urine culture not performed if WBC <=10, OR if Squamous epithelial cells >5. If Squamous epithelial cells >5 suggest recollection.    Bacteria, UA RARE (A) NONE SEEN   Squamous Epithelial / HPF  0-5 0 - 5 /HPF    Comment: Performed at [facility], [address] [place], [place] [address]  Hemoglobin and hematocrit, blood     Status: Abnormal   Collection Time: 09/17/22  3:40 AM  Result Value Ref Range   Hemoglobin 9.1 (L) 13.0 - 17.0 g/dL   HCT 84.6 (L) 96.2 - 95.2 %    Comment: Performed at [facility], [address], [place], [place] [address]    No results found.  Review of Systems  Constitutional:  Negative for chills and fever.  Genitourinary:  Positive for difficulty urinating and hematuria.  All other systems reviewed and are negative.  Blood pressure (!) 166/61, pulse 62, temperature 99 F (37.2 C), temperature source Oral, resp. rate 16, SpO2 97 %. Physical Exam Vitals reviewed.  Constitutional:      Comments: Very mentally spry but some physical frailty in ER.  HENT:     Head: Normocephalic.     Mouth/Throat:     Mouth: Mucous membranes are moist.  Eyes:     Pupils: Pupils are equal, round, and reactive to light.  Cardiovascular:     Rate and Rhythm: Normal rate.  Abdominal:     General: Abdomen is flat.  Genitourinary:    Comments: Large bore hematuria cathter appropirately set up on NS irrigation, efflux farily bloody (light cherry). No clots noted at present. No palpable bladder distension.  Musculoskeletal:        General: Normal range of motion.     Cervical back: Normal range of motion.  Skin:    General: Skin is warm.  Neurological:     General: No focal deficit present.     Mental Status:  He is alert.  Psychiatric:        Mood and Affect: Mood normal.     Assessment/Plan:  Most likely prostate or bladder (radiation cystitis) source of bleeding. Given volume and Hgb drop, rec OR for cysto / clot evacuation / fulgeration / possible re-TURP for diagnostic ant theraputic intent. PSA now. We will consider fast-acting androgen deprivation (firmagon) if prostate source confirmed. Risks ,benefits,  alternatives, expected peri-op course discussed.   Greatly appreciate ER staff and hospitalist comanagement.   Loletta Parish. 09/17/2022, 7:34 AM

## 2022-09-18 ENCOUNTER — Encounter (HOSPITAL_COMMUNITY): Payer: Self-pay | Admitting: Urology

## 2022-09-18 DIAGNOSIS — R310 Gross hematuria: Secondary | ICD-10-CM | POA: Diagnosis not present

## 2022-09-18 LAB — URINE CULTURE: Culture: NO GROWTH

## 2022-09-18 LAB — GLUCOSE, CAPILLARY
Glucose-Capillary: 180 mg/dL — ABNORMAL HIGH (ref 70–99)
Glucose-Capillary: 247 mg/dL — ABNORMAL HIGH (ref 70–99)
Glucose-Capillary: 141 mg/dL — ABNORMAL HIGH (ref 70–99)
Glucose-Capillary: 141 mg/dL — ABNORMAL HIGH (ref 70–99)

## 2022-09-18 MED ORDER — MELATONIN 3 MG PO TABS
1.5000 mg | ORAL_TABLET | Freq: Every evening | ORAL | Status: DC | PRN
  Administered 2022-09-19: 1.5 mg via ORAL
  Filled 2022-09-18: qty 1

## 2022-09-18 MED ORDER — ZOLPIDEM TARTRATE 5 MG PO TABS
2.5000 mg | ORAL_TABLET | Freq: Every day | ORAL | Status: DC
  Administered 2022-09-18: 2.5 mg via ORAL
  Filled 2022-09-18: qty 1

## 2022-09-18 MED ORDER — INSULIN ASPART 100 UNIT/ML IJ SOLN: 0.0000 [IU] | Freq: Every day | INTRAMUSCULAR | Status: DC

## 2022-09-18 MED ORDER — SODIUM CHLORIDE 0.9 % IV SOLN: INTRAVENOUS | Status: DC

## 2022-09-18 MED ORDER — INSULIN ASPART 100 UNIT/ML IJ SOLN: 0.0000 [IU] | Freq: Three times a day (TID) | INTRAMUSCULAR | Status: DC

## 2022-09-18 MED ORDER — INSULIN ASPART PROT & ASPART (70-30 MIX) 100 UNIT/ML ~~LOC~~ SUSP
10.0000 [IU] | Freq: Two times a day (BID) | SUBCUTANEOUS | Status: DC
  Administered 2022-09-18 – 2022-09-19 (×3): 10 [IU] via SUBCUTANEOUS
  Filled 2022-09-18: qty 10

## 2022-09-18 NOTE — Progress Notes (Addendum)
PROGRESS NOTE  [name]  DOB: [birth year]  PCP: Renford Dills, MD ZOX:[id number]  DOA: 09/16/2022  LOS: 1 day  Hospital Day: 3  Brief narrative: [name] is a 87 y.o. male with PMH significant for DM2, HTN, HLD, CHF, CAD/CABG/stent, carotid artery disease s/p CEA, bioprosthetic mitral valve, A-flutter on aspirin only,, stroke, GERD, esophageal stricture, depression, chronic anemia, OSA, spinal stenosis, nephrolithiasis, prostate cancer s/p radiation and TURP 2017  6/28, patient presented to ED with complaining hematuria, urinary retention.  Foley catheter was placed after which patient was comfortable.  Hemoglobin was noted to be stable.  Patient was discharged to follow-up with urology. Next a 6/29, patient returned back to ED with complaint of persistent hematuria and a lot of leaking from around the Foley catheter.  Patient is afebrile, hemodynamically stable. Foley catheter was noted to be obstructed.  Foley was replaced with a three-way catheter and CBI was initiated. Labs with WC count 13.6, hemoglobin 9.9, creatinine 1.4 Urinalysis with moderate hemoglobinuria, trace leukocyte esterase  Admitted to [facility] Urology was consulted 6/30, patient was taken to the OR by Dr. Berneice Heinrich for cystoscopy  Subjective: Patient was seen and examined this morning.  Pleasant elderly Caucasian male.  Propped up in bed.  Not in distress.  No new symptoms.  Weakness improving.  Hematuria has improved.  CBI on hold.   Chart reviewed Remains afebrile, hemodynamically stable Repeat hemoglobin from last night showed 9.1, blood sugar level is 180.   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  No evidence of urinary tract lesion or stone on recent CT. Foley catheter inserted 6/28.  Replaced with three-way catheter 6/29 Urology consult  appreciated. 6/30, underwent cystoscopy with fulguration, clot evacuation by Dr. Berneice Heinrich Hematuria improved.  CBI on hold for now  Chronic anemia History of GERD, esophagitis Baseline hemoglobin between 9 and 10.  Hemoglobin stable despite gross hematuria.  Continue to monitor.  Given significant baseline cardiac issues, transfuse if less than 8 Continue iron supplement, PPI  Recent Labs    07/25/22 0609 07/26/22 0553 09/15/22 2151 09/16/22 2023 09/17/22 0340 09/17/22 1352 09/17/22 1940  HGB 7.1*   < > 9.6* 9.9* 9.1* 9.6* 9.1*  MCV 96.3   < > 95.0 94.1  --   --   --   VITAMINB12 201  --   --   --   --   --   --   FOLATE 26.9  --   --   --   --   --   --   FERRITIN 101  --   --   --   --   --   --   TIBC 354  --   --   --   --   --   --   IRON 16*  --   --   --   --   --   --   RETICCTPCT 1.8  --   --   --   --   --   --    < > = values in this interval not displayed.    AKI on CKD2 Baseline creatinine less than 1.3.  Presented with creatinine of 1.61, likely because of retention.  Gradually improving creatinine.  Continue to monitor.  Recent Labs    07/24/22 2016 07/25/22 0609 07/26/22 0553 07/27/22 0600 09/15/22 2151 09/16/22 2023  BUN 23 21  17 14 28* 25*  CREATININE 1.25* 1.08 1.12 1.20 1.61* 1.40*  CO2 21* 19* 20* 21* 24 23   Type 2 diabetes mellitus with hyperglycemia A1c 7 on 07/25/2022 PTA meds-75/25 insulin 18 units twice daily, metformin 500 mg twice daily Hold metformin because of AKI.  Currently on SSI/Accu-Cheks only. Blood sugar is elevated.  Will start 70/25 insulin at a low dose of 10 units twice daily. Recent Labs  Lab 09/17/22 1302 09/17/22 1610 09/17/22 2141 09/18/22 0723 09/18/22 1201  GLUCAP 211* 328* 234* 180* 247*   Chronic diastolic CHF S/p bioprosthetic mitral valve HTN Most recent echo 07/25/2022 with EF 55 to 60%, moderately elevated pulm artery systolic pressure and mild stenosis of bioprosthetic mitral valve PTA on Toprol 100 mg  daily, losartan 50 mg daily Aldactone 25 mg daily, Lasix 20 daily as needed Continue Toprol.  Hypotension hold because of AKI.   CAD/CABG/stent,  carotid artery disease s/p CEA, History of stroke HLD PTA on aspirin and statin.  Aspirin on hold currently because of hematuria. Continue Crestor  H/o A-flutter Continue metoprolol.   Was only on aspirin 325 mg daily.  Not on anticoagulant presumably because of chronic anemia  OSA    Depression Continue fluoxetine 20 mg p.o. bedtime, Ambien nightly  Spinal stenosis Peripheral neuropathy PTA on Neurontin 600 mg daily    Mobility: Uses walker at home.  To ambulate with mobility tech today.  Goals of care   Code Status: Full Code     DVT prophylaxis:  SCDs Start: 09/17/22 0630   Antimicrobials: None currently Fluid: NS at 50 mill per hour for next 24 hours Consultants: Urology Family Communication: Wife at bedside  Status: Inpatient Level of care:  Telemetry   Patient from: Home Anticipated d/c to: Pending clinical course Needs to continue in-hospital care:  Monitor for hematuria renal function recovery for next 24 hours     Diet:  Diet Order             Diet Carb Modified Fluid consistency: Thin; Room service appropriate? Yes  Diet effective now                   Scheduled Meds:  Chlorhexidine Gluconate Cloth  6 each Topical Daily   ferrous sulfate  325 mg Oral Q breakfast   FLUoxetine  20 mg Oral QHS   insulin aspart  0-5 Units Subcutaneous QHS   insulin aspart  0-9 Units Subcutaneous TID WC   insulin aspart protamine- aspart  10 Units Subcutaneous BID WC   metoprolol succinate  100 mg Oral QHS   pantoprazole  40 mg Oral Daily   rosuvastatin  10 mg Oral Daily    PRN meds: acetaminophen **OR** acetaminophen, nitroGLYCERIN, zolpidem   Infusions:   sodium chloride     sodium chloride irrigation Stopped (09/17/22 0659)    Antimicrobials: Anti-infectives (From admission, onward)    Start      Dose/Rate Route Frequency Ordered Stop   09/17/22 1000  ceFAZolin (ANCEF) IVPB 2g/100 mL premix        2 g 200 mL/hr over 30 Minutes Intravenous  Once 09/17/22 0953 09/17/22 1009   09/17/22 0958  ceFAZolin (ANCEF) 2-4 GM/100ML-% IVPB       Note to Pharmacy: Enriqueta Shutter D: cabinet override      09/17/22 0958 09/17/22 1015   09/17/22 0945  ceFAZolin (ANCEF) powder 2 g  Status:  Discontinued        2 g Other To Surgery  09/17/22 0944 09/17/22 0953       Nutritional status:  Body mass index is 24.74 kg/m.          Objective: Vitals:   09/18/22 0916 09/18/22 1246  BP: (!) 134/59 (!) 122/54  Pulse: (!) 57 60  Resp: 16 20  Temp:    SpO2: 99% 100%    Intake/Output Summary (Last 24 hours) at 09/18/2022 1258 Last data filed at 09/18/2022 1117 Gross per 24 hour  Intake 1320 ml  Output 2300 ml  Net -980 ml   Filed Weights   09/18/22 0011 09/18/22 0500  Weight: 69.4 kg 69.5 kg   Weight change:  Body mass index is 24.74 kg/m.   Physical Exam: General exam: Pleasant, elderly Caucasian male.  Propped up in bed.  Not in distress Skin: No rashes, lesions or ulcers. HEENT: Atraumatic, normocephalic, no obvious bleeding Lungs: Clear to auscultation bilaterally CVS: Regular rate and rhythm, no murmur GI/Abd soft, nontender, nondistended, bowel sound present CNS: Alert, awake, oriented x 3 Psychiatry: Mood appropriate Extremities: No pedal edema, no calf tenderness  Data Review: I have personally reviewed the laboratory data and studies available.  F/u labs ordered Unresulted Labs (From admission, onward)     Start     Ordered   09/19/22 0500  Basic metabolic panel  Tomorrow morning,   R       Question:  Specimen collection method  Answer:  Lab=Lab collect   09/18/22 1258   09/19/22 0500  CBC with Differential/Platelet  Tomorrow morning,   R       Question:  Specimen collection method  Answer:  Lab=Lab collect   09/18/22 1258            Total time spent in  review of labs and imaging, patient evaluation, formulation of plan, documentation and communication with family: 55 minutes  Signed, Lorin Glass, MD [facility] 09/18/2022

## 2022-09-18 NOTE — Progress Notes (Signed)
Mobility Specialist - Progress Note   09/18/22 1117  Mobility  Activity Ambulated with assistance in hallway  Level of Assistance Standby assist, set-up cues, supervision of patient - no hands on  Assistive Device Front wheel walker  Distance Ambulated (ft) 450 ft  Range of Motion/Exercises Active  Activity Response Tolerated well  Mobility Referral Yes  $Mobility charge 1 Mobility  Mobility Specialist Start Time (ACUTE ONLY) 1107  Mobility Specialist Stop Time (ACUTE ONLY) 1117  Mobility Specialist Time Calculation (min) (ACUTE ONLY) 10 min   Pt was found in bed and agreeable to ambulate. Had no complaints and at EOS returned to bed with all needs met. Call bell in reach and family in room.  Billey Chang Mobility Specialist

## 2022-09-18 NOTE — Progress Notes (Signed)
1 Day Post-Op Subjective: 7/1: NAEON. Met with pt and family today.   Objective: Vital signs in last 24 hours: Temp:  [97.6 F (36.4 C)-98.2 F (36.8 C)] 98.2 F (36.8 C) (07/01 0117) Pulse Rate:  [57-72] 60 (07/01 1246) Resp:  [16-20] 20 (07/01 1246) BP: (111-135)/(52-66) 122/54 (07/01 1246) SpO2:  [95 %-100 %] 100 % (07/01 1246) Weight:  [69.4 kg-69.5 kg] 69.5 kg (07/01 0500)  Intake/Output from previous day: 06/30 0701 - 07/01 0700 In: 2570 [P.O.:120; I.V.:650] Out: 5750 [Urine:5750]  Intake/Output this shift: Total I/O In: 840 [P.O.:240; Other:600] Out: -   Physical Exam:  General: Alert and oriented CV: No cyanosis Lungs: equal chest rise Abdomen: Soft, NTND, no rebound or guarding Gu: Foley cath draining clear yellow urine  Lab Results: Recent Labs    09/17/22 0340 09/17/22 1352 09/17/22 1940  HGB 9.1* 9.6* 9.1*  HCT 28.6* 29.4* 27.9*   BMET Recent Labs    09/15/22 2151 09/16/22 2023  NA 140 137  K 4.2 4.4  CL 108 102  CO2 24 23  GLUCOSE 141* 155*  BUN 28* 25*  CREATININE 1.61* 1.40*  CALCIUM 9.1 9.6     Studies/Results: No results found.  Assessment/Plan: #Hematuria S/p cysto with clot evac and fulgeration w/ Dr Berneice Heinrich 6/30 Clear urine this am and on afternoon rounds. Voiding trial in the am after viewing updated labs.    LOS: 1 day   Elmon Kirschner, NP [facility] Pager: [phone]  09/18/2022, 2:10 PM

## 2022-09-18 NOTE — TOC CM/SW Note (Signed)
Transition of Care [facility]) - Inpatient Brief Assessment   Patient Details  Name: [name] MRN: [id number] Date of Birth: 23-Feb-1936  [facility]) CM/SW Contact:    Howell Rucks, RN Phone Number: 09/18/2022, 2:41 PM   Clinical Narrative: Met with pt at bedside to introduce role of TOC/NCM and review for dc planning. Pt reports he has a PCP and pharmacy in place, uses a walker as needed for functional mobility, no in home care services, reports [facility] will provide transportation at discharge. TOC Brief Assessment completed. No TOC needs identified.     Transition of Care Asessment: Insurance and Status: Insurance coverage has been reviewed Patient has primary care physician: Yes Home environment has been reviewed: private residence with spouse Prior level of function:: Independent with walker Prior/Current Home Services: No current home services Social Determinants of Health Reivew: SDOH reviewed no interventions necessary Readmission risk has been reviewed: Yes Transition of care needs: no transition of care needs at this time

## 2022-09-19 LAB — CBC WITH DIFFERENTIAL/PLATELET
Abs Immature Granulocytes: 0.11 10*3/uL — ABNORMAL HIGH (ref 0.00–0.07)
Basophils Absolute: 0.1 10*3/uL (ref 0.0–0.1)
Basophils Relative: 0 %
Eosinophils Absolute: 0.8 10*3/uL — ABNORMAL HIGH (ref 0.0–0.5)
Eosinophils Relative: 5 %
HCT: 29 % — ABNORMAL LOW (ref 39.0–52.0)
Hemoglobin: 9.3 g/dL — ABNORMAL LOW (ref 13.0–17.0)
Immature Granulocytes: 1 %
Lymphocytes Relative: 11 %
Lymphs Abs: 1.8 10*3/uL (ref 0.7–4.0)
MCH: 30.8 pg (ref 26.0–34.0)
MCHC: 32.1 g/dL (ref 30.0–36.0)
MCV: 96 fL (ref 80.0–100.0)
Monocytes Absolute: 1.3 10*3/uL — ABNORMAL HIGH (ref 0.1–1.0)
Monocytes Relative: 9 %
Neutro Abs: 11.3 10*3/uL — ABNORMAL HIGH (ref 1.7–7.7)
Neutrophils Relative %: 74 %
Platelets: 244 10*3/uL (ref 150–400)
RBC: 3.02 MIL/uL — ABNORMAL LOW (ref 4.22–5.81)
RDW: 15.3 % (ref 11.5–15.5)
WBC: 15.4 10*3/uL — ABNORMAL HIGH (ref 4.0–10.5)
nRBC: 0 % (ref 0.0–0.2)

## 2022-09-19 LAB — BASIC METABOLIC PANEL
Anion gap: 8 (ref 5–15)
BUN: 32 mg/dL — ABNORMAL HIGH (ref 8–23)
CO2: 24 mmol/L (ref 22–32)
Calcium: 8.9 mg/dL (ref 8.9–10.3)
Chloride: 104 mmol/L (ref 98–111)
Creatinine, Ser: 1.36 mg/dL — ABNORMAL HIGH (ref 0.61–1.24)
GFR, Estimated: 51 mL/min — ABNORMAL LOW (ref >60)
Glucose, Bld: 141 mg/dL — ABNORMAL HIGH (ref 70–99)
Potassium: 4.4 mmol/L (ref 3.5–5.1)
Sodium: 136 mmol/L (ref 135–145)

## 2022-09-19 LAB — GLUCOSE, CAPILLARY
Glucose-Capillary: 130 mg/dL — ABNORMAL HIGH (ref 70–99)
Glucose-Capillary: 162 mg/dL — ABNORMAL HIGH (ref 70–99)

## 2022-09-19 MED ORDER — CEPHALEXIN 500 MG PO CAPS
500.0000 mg | ORAL_CAPSULE | Freq: Three times a day (TID) | ORAL | Status: DC
  Administered 2022-09-19: 500 mg via ORAL
  Filled 2022-09-19: qty 1

## 2022-09-19 MED ORDER — CEPHALEXIN 500 MG PO CAPS: 500.0000 mg | ORAL_CAPSULE | Freq: Three times a day (TID) | ORAL | 0 refills | Status: AC

## 2022-09-19 MED ORDER — ASPIRIN 325 MG PO TBEC: 325.0000 mg | DELAYED_RELEASE_TABLET | Freq: Every day | ORAL | Status: DC

## 2022-09-19 NOTE — Progress Notes (Signed)
   2 Days Post-Op Subjective: 7/2: NAEON. Pt sleeping on arrival but easily awoken. He was accompanied by his wife. He maintains he has had no retention issues prior to this hospitalization and would like to proceed with voiding trial.   Objective: Vital signs in last 24 hours: Temp:  [98 F (36.7 C)-98.7 F (37.1 C)] 98.2 F (36.8 C) (07/02 0606) Pulse Rate:  [57-67] 58 (07/02 0606) Resp:  [16-20] 20 (07/02 0606) BP: (117-153)/(51-60) 153/60 (07/02 0606) SpO2:  [96 %-100 %] 96 % (07/02 0606) Weight:  [66.5 kg] 66.5 kg (07/02 0500)  Intake/Output from previous day: 07/01 0701 - 07/02 0700 In: 1445.4 [P.O.:1400; I.V.:45.4] Out: 1600 [Urine:1600]  Intake/Output this shift: No intake/output data recorded.  Physical Exam:  General: Alert and oriented CV: No cyanosis Lungs: equal chest rise Abdomen: Soft, NTND, no rebound or guarding Gu: Foley cath in place draining clear yellow urine  Lab Results: Recent Labs    09/17/22 1352 09/17/22 1940 09/19/22 0533  HGB 9.6* 9.1* 9.3*  HCT 29.4* 27.9* 29.0*   BMET Recent Labs    09/16/22 2023 09/19/22 0533  NA 137 136  K 4.4 4.4  CL 102 104  CO2 23 24  GLUCOSE 155* 141*  BUN 25* 32*  CREATININE 1.40* 1.36*  CALCIUM 9.6 8.9     Studies/Results: No results found.  Assessment/Plan: #Hematuria #Radiation Cystitis #prostate cancer, s/p XRT 2017 S/p cysto with clot evac and fulgeration w/ Dr Berneice Heinrich 6/30 Clear urine this am. Remove foley catheter. Nursing to bladder scan Q3 and call with results.  AM labs show stable CKD.  If pt voids without difficulty, ok to discharge from urologic perspective.    LOS: 2 days   Elmon Kirschner, NP [facility] Pager: [phone]  09/19/2022, 9:02 AM

## 2022-09-19 NOTE — Progress Notes (Signed)
Educated pt and spouse on DC instructions including but not limited to medications, s/s to monitor for, appointments. Printed instructions given to spouse per pt request. All questions answered. Both verbalized understanding and in agreement. IV access removed. Pt taken off floor to meet spouse a the front of the hospital by [facility].

## 2022-09-19 NOTE — Discharge Summary (Signed)
Physician Discharge Summary  [name] ZOX:[id number] DOB: May 02, 1935 DOA: 09/16/2022  PCP: Renford Dills, MD  Admit date: 09/16/2022 Discharge date: 09/19/2022  Admitted From: home Discharge disposition: home  Recommendations at discharge:  Complete course of antibiotic with Keflex for 3 days Resume aspirin in 3 days Stop metformin Continue Toprol, losartan.  Hold Aldactone for now.  Continue to monitor blood pressure at home.  Reassess blood pressure medications with your primary care provider. Follow-up with PCP in a week for labs. Follow-up at [facility] urology for any urological concerns.  Brief narrative: [name] is a 87 y.o. male with PMH significant for DM2, HTN, HLD, CHF, CAD/CABG/stent, carotid artery disease s/p CEA, bioprosthetic mitral valve, A-flutter on aspirin only,, stroke, GERD, esophageal stricture, depression, chronic anemia, OSA, spinal stenosis, nephrolithiasis, prostate cancer s/p radiation and TURP 2017  6/28, patient presented to ED with complaining hematuria, urinary retention.  Foley catheter was placed after which patient was comfortable.  Hemoglobin was noted to be stable.  Patient was discharged to follow-up with urology. Next a 6/29, patient returned back to ED with complaint of persistent hematuria and a lot of leaking from around the Foley catheter.  Patient is afebrile, hemodynamically stable. Foley catheter was noted to be obstructed.  Foley was replaced with a three-way catheter and CBI was initiated. Labs with WC count 13.6, hemoglobin 9.9, creatinine 1.4 Urinalysis with moderate hemoglobinuria, trace leukocyte esterase  Admitted to [facility]rology was consulted 6/30, patient was taken to the OR by Dr. Berneice Heinrich for cystoscopy  Subjective: Patient was seen and examined this morning.  Pleasant elderly Caucasian male.   Sitting up in recliner.  Not in distress.  Foley catheter was removed this morning.  Able to void.   No recurrence of of  hematuria.   Feels stable for discharge today.  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  No evidence of urinary tract lesion or stone on recent CT. Foley catheter was inserted 6/28.  Replaced with three-way catheter 6/29 Urology consult was obtained. 6/30, underwent cystoscopy with fulguration, clot evacuation by Dr. Berneice Heinrich Hematuria improved.   7/2, Foley catheter was removed.  Voiding trial successful.  UTI Leukocytosis Urinalysis 6/28 showed cloudy urine with trace leukocytes and rare bacteria. WBC count is running elevated. Patient probably had urinary tract infection precipitating hematuria. Discussed with urology NP Sheria Lang.  Will presumptively start the patient on Keflex for 3 days Patient to follow-up with PCP as an outpatient for blood work  Chronic anemia History of GERD, esophagitis Baseline hemoglobin between 9 and 10.  Hemoglobin stable despite gross hematuria.  Continue to monitor.  Given significant baseline cardiac issues, transfuse if less than 8 Continue iron supplement, Recent Labs    07/25/22 0609 07/26/22 0553 09/16/22 2023 09/17/22 0340 09/17/22 1352 09/17/22 1940 09/19/22 0533  HGB 7.1*   < > 9.9* 9.1* 9.6* 9.1* 9.3*  MCV 96.3   < > 94.1  --   --   --  96.0  VITAMINB12 201  --   --   --   --   --   --   FOLATE 26.9  --   --   --   --   --   --   FERRITIN 101  --   --   --   --   --   --   TIBC 354  --   --   --   --   --   --  IRON 16*  --   --   --   --   --   --   RETICCTPCT 1.8  --   --   --   --   --   --    < > = values in this interval not displayed.   AKI on CKD2 Baseline creatinine less than 1.3.  Presented with creatinine of 1.61, likely because of retention.  Gradually improving creatinine.  Adequately hydrated.  Recent Labs    07/24/22 2016 07/25/22 0609 07/26/22 0553 07/27/22 0600  09/15/22 2151 09/16/22 2023 09/19/22 0533  BUN 23 21 17 14  28* 25* 32*  CREATININE 1.25* 1.08 1.12 1.20 1.61* 1.40* 1.36*  CO2 21* 19* 20* 21* 24 23 24    Type 2 diabetes mellitus with hyperglycemia A1c 7 on 07/25/2022 PTA meds-75/25 insulin 18 units twice daily, metformin 500 mg twice daily Post discharge, okay to resume home regimen of insulin.  I would not resume metformin given borderline elevated creatinine. Recent Labs  Lab 09/18/22 1201 09/18/22 1611 09/18/22 2132 09/19/22 0717 09/19/22 1119  GLUCAP 247* 141* 141* 130* 162*   Chronic diastolic CHF S/p bioprosthetic mitral valve HTN Most recent echo 07/25/2022 with EF 55 to 60%, moderately elevated pulm artery systolic pressure and mild stenosis of bioprosthetic mitral valve PTA on Toprol 100 mg daily, losartan 50 mg daily Aldactone 25 mg daily, Lasix 20 daily as needed Post discharge, continue Toprol and losartan.  Keep Aldactone on hold.  Can resume Lasix as needed. Continue to monitor blood pressures at home.  Reassess blood pressure medications with your PCP  CAD/CABG/stent,  carotid artery disease s/p CEA, History of stroke HLD PTA on aspirin and statin.  Aspirin on hold currently because of hematuria. Continue Crestor.  Okay to resume aspirin in 3 days.  H/o A-flutter Continue metoprolol.   Was only on aspirin 325 mg daily.  Not on anticoagulant presumably because of chronic anemia  Depression Continue fluoxetine 20 mg p.o. bedtime, Ambien nightly  Spinal stenosis Peripheral neuropathy Continue Neurontin 600 mg daily  Goals of care   Code Status: Full Code   Wounds:  -    Discharge Exam:   Vitals:   09/18/22 2200 09/19/22 0500 09/19/22 0606 09/19/22 1222  BP:   (!) 153/60 (!) 137/55  Pulse: 67  (!) 58 (!) 51  Resp:   20 16  Temp:   98.2 F (36.8 C) 98 F (36.7 C)  TempSrc:    Oral  SpO2:   96% 97%  Weight:  66.5 kg    Height:        Body mass index is 23.66 kg/m.   General exam: Pleasant,  elderly Caucasian male.  Propped up in bed.  Not in distress Skin: No rashes, lesions or ulcers. HEENT: Atraumatic, normocephalic, no obvious bleeding Lungs: Clear to auscultation bilaterally CVS: Regular rate and rhythm, no murmur GI/Abd soft, nontender, nondistended, bowel sound present CNS: Alert, awake, oriented x 3 Psychiatry: Mood appropriate Extremities: No pedal edema, no calf tenderness  Follow ups:    Follow-up Information     Renford Dills, MD Follow up.   Specialty: Internal Medicine Contact information: [address] [place] [address] [phone]                 Discharge Instructions:   Discharge Instructions     Call MD for:  difficulty breathing, headache or visual disturbances   Complete by: As directed    Call MD for:  extreme fatigue   Complete by: As directed    Call MD for:  hives   Complete by: As directed    Call MD for:  persistant dizziness or light-headedness   Complete by: As directed    Call MD for:  persistant nausea and vomiting   Complete by: As directed    Call MD for:  severe uncontrolled pain   Complete by: As directed    Call MD for:  temperature >100.4   Complete by: As directed    Diet - low sodium heart healthy   Complete by: As directed    Diet Carb Modified   Complete by: As directed    Discharge instructions   Complete by: As directed    Recommendations at discharge:   Complete course of antibiotic with Keflex for 3 days  Resume aspirin in 3 days  Stop metformin  Continue Toprol, losartan.  Hold Aldactone for now.  Continue to monitor blood pressure at home.  Reassess blood pressure medications with your primary care provider.  Follow-up with PCP in a week for labs.  Follow-up at [facility] urology for any urological concerns.  General discharge instructions: Follow with Primary MD Renford Dills, MD in 7 days  Please request your PCP  to go over your hospital tests, procedures,  radiology results at the follow up. Please get your medicines reviewed and adjusted.  Your PCP may decide to repeat certain labs or tests as needed. Do not drive, operate heavy machinery, perform activities at heights, swimming or participation in water activities or provide baby sitting services if your were admitted for syncope or siezures until you have seen by Primary MD or a Neurologist and advised to do so again. [place] Controlled Substance Reporting System database was reviewed. Do not drive, operate heavy machinery, perform activities at heights, swim, participate in water activities or provide baby-sitting services while on medications for pain, sleep and mood until your outpatient physician has reevaluated you and advised to do so again.  You are strongly recommended to comply with the dose, frequency and duration of prescribed medications. Activity: As tolerated with Full fall precautions use walker/cane & assistance as needed Avoid using any recreational substances like cigarette, tobacco, alcohol, or non-prescribed drug. If you experience worsening of your admission symptoms, develop shortness of breath, life threatening emergency, suicidal or homicidal thoughts you must seek medical attention immediately by calling 911 or calling your MD immediately  if symptoms less severe. You must read complete instructions/literature along with all the possible adverse reactions/side effects for all the medicines you take and that have been prescribed to you. Take any new medicine only after you have completely understood and accepted all the possible adverse reactions/side effects.  Wear Seat belts while driving. You were cared for by a hospitalist during your hospital stay. If you have any questions about your discharge medications or the care you received while you were in the hospital after you are discharged, you can call the unit and ask to speak with the hospitalist or the covering physician.  Once you are discharged, your primary care physician will handle any further medical issues. Please note that NO REFILLS for any discharge medications will be authorized once you are discharged, as it is imperative that you return to your primary care physician (or establish a relationship with a primary care physician if you do not have one).   Increase activity slowly   Complete by: As directed  Discharge Medications:   Allergies as of 09/19/2022   No Known Allergies      Medication List     STOP taking these medications    cyanocobalamin 1000 MCG tablet   metFORMIN 1000 MG (MOD) 24 hr tablet Commonly known as: GLUMETZA   metFORMIN 500 MG tablet Commonly known as: GLUCOPHAGE   pantoprazole 40 MG tablet Commonly known as: Protonix   potassium chloride SA 20 MEQ tablet Commonly known as: KLOR-CON M   spironolactone 25 MG tablet Commonly known as: ALDACTONE       TAKE these medications    aspirin EC 325 MG tablet Take 1 tablet (325 mg total) by mouth daily. Start taking on: September 22, 2022 What changed: These instructions start on September 22, 2022. If you are unsure what to do until then, ask your doctor or other care provider.   cephALEXin 500 MG capsule Commonly known as: KEFLEX Take 1 capsule (500 mg total) by mouth every 8 (eight) hours for 3 days.   ferrous sulfate 325 (65 FE) MG tablet Take 1 tablet (325 mg total) by mouth daily with breakfast.   FLUoxetine 20 MG capsule Commonly known as: PROZAC Take 20 mg by mouth at bedtime.   furosemide 20 MG tablet Commonly known as: LASIX Take 1 tablet (20 mg total) by mouth daily as needed for fluid.   gabapentin 600 MG tablet Commonly known as: NEURONTIN Take 600 mg by mouth daily.   insulin lispro protamine-lispro (75-25) 100 UNIT/ML Susp injection Commonly known as: HUMALOG 75/25 MIX Inject 18 Units into the skin 2 (two) times daily with a meal.   losartan 50 MG tablet Commonly known as: COZAAR Take  50 mg by mouth daily. What changed: Another medication with the same name was removed. Continue taking this medication, and follow the directions you see here.   melatonin 3 MG Tabs tablet Take 1 tablet by mouth at bedtime.   metoprolol succinate 100 MG 24 hr tablet Commonly known as: TOPROL-XL Take 100 mg by mouth at bedtime. What changed: Another medication with the same name was removed. Continue taking this medication, and follow the directions you see here.   multivitamin tablet Take 1 tablet by mouth daily.   nitroGLYCERIN 0.4 MG SL tablet Commonly known as: NITROSTAT Place 1 tablet (0.4 mg total) under the tongue every 5 (five) minutes as needed. For chest pain   rosuvastatin 10 MG tablet Commonly known as: CRESTOR Take 10 mg by mouth every morning.   zolpidem 10 MG tablet Commonly known as: AMBIEN Take 5 mg by mouth at bedtime.         The results of significant diagnostics from this hospitalization (including imaging, microbiology, ancillary and laboratory) are listed below for reference.    Procedures and Diagnostic Studies:   No results found.   Labs:   Basic Metabolic Panel: Recent Labs  Lab 09/15/22 2151 09/16/22 2023 09/19/22 0533  NA 140 137 136  K 4.2 4.4 4.4  CL 108 102 104  CO2 24 23 24   GLUCOSE 141* 155* 141*  BUN 28* 25* 32*  CREATININE 1.61* 1.40* 1.36*  CALCIUM 9.1 9.6 8.9   GFR Estimated Creatinine Clearance: 36.5 mL/min (A) (by C-G formula based on SCr of 1.36 mg/dL (H)). Liver Function Tests: No results for input(s): "AST", "ALT", "ALKPHOS", "BILITOT", "PROT", "ALBUMIN" in the last 168 hours. No results for input(s): "LIPASE", "AMYLASE" in the last 168 hours. No results for input(s): "AMMONIA" in the last 168 hours. Coagulation  profile No results for input(s): "INR", "PROTIME" in the last 168 hours.  CBC: Recent Labs  Lab 09/15/22 2151 09/16/22 2023 09/17/22 0340 09/17/22 1352 09/17/22 1940 09/19/22 0533  WBC 9.2 13.6*   --   --   --  15.4*  NEUTROABS  --  11.0*  --   --   --  11.3*  HGB 9.6* 9.9* 9.1* 9.6* 9.1* 9.3*  HCT 30.1* 30.4* 28.6* 29.4* 27.9* 29.0*  MCV 95.0 94.1  --   --   --  96.0  PLT 224 249  --   --   --  244   Cardiac Enzymes: No results for input(s): "CKTOTAL", "CKMB", "CKMBINDEX", "TROPONINI" in the last 168 hours. BNP: Invalid input(s): "POCBNP" CBG: Recent Labs  Lab 09/18/22 1201 09/18/22 1611 09/18/22 2132 09/19/22 0717 09/19/22 1119  GLUCAP 247* 141* 141* 130* 162*   D-Dimer No results for input(s): "DDIMER" in the last 72 hours. Hgb A1c No results for input(s): "HGBA1C" in the last 72 hours. Lipid Profile No results for input(s): "CHOL", "HDL", "LDLCALC", "TRIG", "CHOLHDL", "LDLDIRECT" in the last 72 hours. Thyroid function studies No results for input(s): "TSH", "T4TOTAL", "T3FREE", "THYROIDAB" in the last 72 hours.  Invalid input(s): "FREET3" Anemia work up No results for input(s): "VITAMINB12", "FOLATE", "FERRITIN", "TIBC", "IRON", "RETICCTPCT" in the last 72 hours. Microbiology Recent Results (from the past 240 hour(s))  Urine Culture     Status: None   Collection Time: 09/15/22  9:27 PM   Specimen: Urine, Clean Catch  Result Value Ref Range Status   Specimen Description   Final    URINE, CLEAN CATCH Performed at [facility], [address], [place], [place] [address]    Special Requests   Final    NONE Performed at [facility], [address] [place], [place] [address]    Culture   Final    NO GROWTH Performed at [facility], [address], [place], [place] [address]    Report Status 09/17/2022 FINAL  Final  Urine Culture     Status: None   Collection Time: 09/16/22 10:59 PM   Specimen: Urine, Random  Result Value Ref Range Status   Specimen Description   Final    URINE, RANDOM Performed at [facility], [address], [place], [place] [address]    Special Requests URINE, CLEAN  CATCH  Final   Culture   Final    NO GROWTH Performed at [facility], [address], [place], [place] [address]    Report Status 09/18/2022 FINAL  Final    Time coordinating discharge: 45 minutes  Signed: Yazeed Pryer  [facility] 09/19/2022, 12:29 PM

## 2022-09-19 NOTE — Progress Notes (Signed)
Elmon Kirschner NP with [facility] notified of both bladder scans and of pink tinged urine with dried bright red blood to inner thighs. Per [place], ok to DC home.

## 2022-09-19 NOTE — Plan of Care (Signed)

## 2022-10-03 ENCOUNTER — Emergency Department (HOSPITAL_COMMUNITY)
Admission: EM | Admit: 2022-10-03 | Discharge: 2022-10-03 | Disposition: A | Discharge status: Home / Self Care | Payer: Medicare Other | Attending: Emergency Medicine | Admitting: Emergency Medicine

## 2022-10-03 ENCOUNTER — Other Ambulatory Visit: Payer: Self-pay

## 2022-10-03 DIAGNOSIS — R32 Unspecified urinary incontinence: Secondary | ICD-10-CM | POA: Diagnosis not present

## 2022-10-03 DIAGNOSIS — R339 Retention of urine, unspecified: Secondary | ICD-10-CM | POA: Insufficient documentation

## 2022-10-03 DIAGNOSIS — Z794 Long term (current) use of insulin: Secondary | ICD-10-CM | POA: Diagnosis not present

## 2022-10-03 DIAGNOSIS — N1832 Chronic kidney disease, stage 3b: Secondary | ICD-10-CM | POA: Diagnosis not present

## 2022-10-03 DIAGNOSIS — D649 Anemia, unspecified: Secondary | ICD-10-CM | POA: Diagnosis not present

## 2022-10-03 DIAGNOSIS — Z7982 Long term (current) use of aspirin: Secondary | ICD-10-CM | POA: Insufficient documentation

## 2022-10-03 DIAGNOSIS — Z79899 Other long term (current) drug therapy: Secondary | ICD-10-CM | POA: Insufficient documentation

## 2022-10-03 DIAGNOSIS — Z8546 Personal history of malignant neoplasm of prostate: Secondary | ICD-10-CM | POA: Diagnosis not present

## 2022-10-03 DIAGNOSIS — R319 Hematuria, unspecified: Secondary | ICD-10-CM | POA: Diagnosis not present

## 2022-10-03 DIAGNOSIS — I10 Essential (primary) hypertension: Secondary | ICD-10-CM | POA: Diagnosis not present

## 2022-10-03 LAB — URINALYSIS, ROUTINE W REFLEX MICROSCOPIC
Bilirubin Urine: NEGATIVE
Glucose, UA: NEGATIVE mg/dL
Ketones, ur: NEGATIVE mg/dL
Nitrite: NEGATIVE
Protein, ur: 100 mg/dL — AB
RBC / HPF: >50 RBC/hpf (ref 0–5)
Specific Gravity, Urine: 1.006 (ref 1.005–1.030)
pH: 6 (ref 5.0–8.0)

## 2022-10-03 NOTE — ED Provider Notes (Signed)
[facility] AT [facility] Provider Note   CSN: [id number] Arrival date & time: 10/03/22  1133     History  Chief Complaint  Patient presents with   Urinary Retention    [name] is a 87 y.o. male.  87 year old male presents with urinary retention times several hours.  History of prostate cancer and has had this for the past.  States that he is only able to urinate small amounts.  Denies any flank pain.  Some suprapubic pressure.  No fever or chills.       Home Medications Prior to Admission medications   Medication Sig Start Date End Date Taking? Authorizing Provider  aspirin EC 325 MG tablet Take 1 tablet (325 mg total) by mouth daily. 09/22/22   Lorin Glass, MD  ferrous sulfate 325 (65 FE) MG tablet Take 1 tablet (325 mg total) by mouth daily with breakfast. 07/28/22   Burnadette Pop, MD  FLUoxetine (PROZAC) 20 MG capsule Take 20 mg by mouth at bedtime.     [provider]  furosemide (LASIX) 20 MG tablet Take 1 tablet (20 mg total) by mouth daily as needed for fluid. 12/28/16   Allred, Fayrene Fearing, MD  gabapentin (NEURONTIN) 600 MG tablet Take 600 mg by mouth daily. 07/17/22   [provider]  insulin lispro protamine-lispro (HUMALOG 75/25 MIX) (75-25) 100 UNIT/ML SUSP injection Inject 18 Units into the skin 2 (two) times daily with a meal.    [provider]  losartan (COZAAR) 50 MG tablet Take 50 mg by mouth daily.    [provider]  Melatonin 3 MG TABS Take 1 tablet by mouth at bedtime.     [provider]  metoprolol succinate (TOPROL-XL) 100 MG 24 hr tablet Take 100 mg by mouth at bedtime.    [provider]  Multiple Vitamin (MULTIVITAMIN) tablet Take 1 tablet by mouth daily.    [provider]  nitroGLYCERIN (NITROSTAT) 0.4 MG SL tablet Place 1 tablet (0.4 mg total) under the tongue every 5 (five) minutes as needed. For chest pain 03/28/12   [name], Deloris Ping, MD  rosuvastatin  (CRESTOR) 10 MG tablet Take 10 mg by mouth every morning.     [provider]  zolpidem (AMBIEN) 10 MG tablet Take 5 mg by mouth at bedtime.    [provider]      Allergies    Patient has no known allergies.    Review of Systems   Review of Systems  All other systems reviewed and are negative.   Physical Exam Updated Vital Signs BP 130/63 (BP Location: Left Arm)   Pulse 62   Temp 98.1 F (36.7 C) (Oral)   Resp 16   Ht 1.676 m (5\' 6" )   Wt 66.5 kg   SpO2 94%   BMI 23.66 kg/m  Physical Exam Vitals and nursing note reviewed.  Constitutional:      General: He is not in acute distress.    Appearance: Normal appearance. He is well-developed. He is not toxic-appearing.  HENT:     Head: Normocephalic and atraumatic.  Eyes:     General: Lids are normal.     Conjunctiva/sclera: Conjunctivae normal.     Pupils: Pupils are equal, round, and reactive to light.  Neck:     Thyroid: No thyroid mass.     Trachea: No tracheal deviation.  Cardiovascular:     Rate and Rhythm: Normal rate and regular rhythm.  Heart sounds: Normal heart sounds. No murmur heard.    No gallop.  Pulmonary:     Effort: Pulmonary effort is normal. No respiratory distress.     Breath sounds: Normal breath sounds. No stridor. No decreased breath sounds, wheezing, rhonchi or rales.  Abdominal:     General: There is no distension.     Palpations: Abdomen is soft.     Tenderness: There is no abdominal tenderness. There is no rebound.  Musculoskeletal:        General: No tenderness. Normal range of motion.     Cervical back: Normal range of motion and neck supple.  Skin:    General: Skin is warm and dry.     Findings: No abrasion or rash.  Neurological:     Mental Status: He is alert and oriented to person, place, and time. Mental status is at baseline.     GCS: GCS eye subscore is 4. GCS verbal subscore is 5. GCS motor subscore is 6.     Cranial Nerves: Cranial nerves are intact. No  cranial nerve deficit.     Sensory: No sensory deficit.     Motor: Motor function is intact.  Psychiatric:        Attention and Perception: Attention normal.        Speech: Speech normal.        Behavior: Behavior normal.     ED Results / Procedures / Treatments   Labs (all labs ordered are listed, but only abnormal results are displayed) Labs Reviewed  URINALYSIS, ROUTINE W REFLEX MICROSCOPIC    EKG None  Radiology No results found.  Procedures Procedures    Medications Ordered in ED Medications - No data to display  ED Course/ Medical Decision Making/ A&P                             Medical Decision Making Amount and/or Complexity of Data Reviewed Labs: ordered.   Patient is bladder irrigated by nursing after Foley catheter placed by them.  Has good urine flow at this time.  Urinalysis without evidence of infection.  Will be sent home with Foley catheter with follow-up with his urologist        Final Clinical Impression(s) / ED Diagnoses Final diagnoses:  None    Rx / DC Orders ED Discharge Orders     None         Lorre Nick, MD 10/03/22 1449

## 2022-10-03 NOTE — ED Triage Notes (Signed)
Pt c/o urinary retention states was able to urinate this morning but "was not a good flow"

## 2022-10-03 NOTE — Discharge Instructions (Signed)
Call your urologist today to schedule a follow-up visit

## 2022-10-05 DIAGNOSIS — R310 Gross hematuria: Secondary | ICD-10-CM | POA: Diagnosis not present

## 2022-10-05 DIAGNOSIS — R338 Other retention of urine: Secondary | ICD-10-CM | POA: Diagnosis not present

## 2022-10-09 DIAGNOSIS — R310 Gross hematuria: Secondary | ICD-10-CM | POA: Diagnosis not present

## 2022-11-07 DIAGNOSIS — C61 Malignant neoplasm of prostate: Secondary | ICD-10-CM | POA: Diagnosis not present

## 2022-11-07 DIAGNOSIS — R310 Gross hematuria: Secondary | ICD-10-CM | POA: Diagnosis not present

## 2022-11-08 DIAGNOSIS — R933 Abnormal findings on diagnostic imaging of other parts of digestive tract: Secondary | ICD-10-CM | POA: Diagnosis not present

## 2022-11-08 DIAGNOSIS — D509 Iron deficiency anemia, unspecified: Secondary | ICD-10-CM | POA: Diagnosis not present

## 2022-11-08 DIAGNOSIS — R195 Other fecal abnormalities: Secondary | ICD-10-CM | POA: Diagnosis not present

## 2022-11-08 DIAGNOSIS — K5520 Angiodysplasia of colon without hemorrhage: Secondary | ICD-10-CM | POA: Diagnosis not present

## 2022-11-08 DIAGNOSIS — K635 Polyp of colon: Secondary | ICD-10-CM | POA: Diagnosis not present

## 2022-11-10 DIAGNOSIS — K635 Polyp of colon: Secondary | ICD-10-CM | POA: Diagnosis not present

## 2022-11-14 ENCOUNTER — Encounter (HOSPITAL_COMMUNITY): Payer: Self-pay

## 2022-11-14 ENCOUNTER — Emergency Department (HOSPITAL_COMMUNITY)
Admission: EM | Admit: 2022-11-14 | Discharge: 2022-11-14 | Disposition: A | Discharge status: Home / Self Care | Payer: Medicare Other | Attending: Emergency Medicine | Admitting: Emergency Medicine

## 2022-11-14 ENCOUNTER — Other Ambulatory Visit: Payer: Self-pay

## 2022-11-14 DIAGNOSIS — Z7982 Long term (current) use of aspirin: Secondary | ICD-10-CM | POA: Insufficient documentation

## 2022-11-14 DIAGNOSIS — Z79899 Other long term (current) drug therapy: Secondary | ICD-10-CM | POA: Diagnosis not present

## 2022-11-14 DIAGNOSIS — E119 Type 2 diabetes mellitus without complications: Secondary | ICD-10-CM | POA: Insufficient documentation

## 2022-11-14 DIAGNOSIS — Z794 Long term (current) use of insulin: Secondary | ICD-10-CM | POA: Diagnosis not present

## 2022-11-14 DIAGNOSIS — Z8546 Personal history of malignant neoplasm of prostate: Secondary | ICD-10-CM | POA: Insufficient documentation

## 2022-11-14 DIAGNOSIS — I10 Essential (primary) hypertension: Secondary | ICD-10-CM | POA: Diagnosis not present

## 2022-11-14 DIAGNOSIS — N390 Urinary tract infection, site not specified: Secondary | ICD-10-CM | POA: Diagnosis not present

## 2022-11-14 DIAGNOSIS — I2510 Atherosclerotic heart disease of native coronary artery without angina pectoris: Secondary | ICD-10-CM | POA: Insufficient documentation

## 2022-11-14 DIAGNOSIS — R339 Retention of urine, unspecified: Secondary | ICD-10-CM | POA: Insufficient documentation

## 2022-11-14 LAB — URINALYSIS, W/ REFLEX TO CULTURE (INFECTION SUSPECTED)
Bilirubin Urine: NEGATIVE
Glucose, UA: NEGATIVE mg/dL
Ketones, ur: NEGATIVE mg/dL
Nitrite: NEGATIVE
Protein, ur: 30 mg/dL — AB
RBC / HPF: >50 RBC/hpf (ref 0–5)
Specific Gravity, Urine: 1.009 (ref 1.005–1.030)
pH: 6 (ref 5.0–8.0)

## 2022-11-14 MED ORDER — CEPHALEXIN 500 MG PO CAPS
500.0000 mg | ORAL_CAPSULE | Freq: Once | ORAL | Status: AC
  Administered 2022-11-14: 500 mg via ORAL
  Filled 2022-11-14: qty 1

## 2022-11-14 MED ORDER — CEPHALEXIN 500 MG PO CAPS: 500.0000 mg | ORAL_CAPSULE | Freq: Four times a day (QID) | ORAL | 0 refills | Status: AC

## 2022-11-14 NOTE — ED Triage Notes (Signed)
Pt arrived POV. States urinary retention since this morning. Feels a lot of pressure.States this has happened before. No other symptoms

## 2022-11-14 NOTE — ED Provider Triage Note (Signed)
Emergency Medicine Provider Triage Evaluation Note  [name] , a 87 y.o. male  was evaluated in triage.  Pt complains of urinary retention since waking this AM. Associated abdominal discomfort. Hx of same.   Review of Systems  Positive: As above Negative: Fever, chills  Physical Exam  BP (!) 133/56 (BP Location: Left Arm)   Pulse 64   Temp 98.3 F (36.8 C) (Oral)   Resp 16   SpO2 97%  Gen:   Awake, no distress   Resp:  Normal effort  MSK:   Moves extremities without difficulty  Other:    Medical Decision Making  Medically screening exam initiated at 6:46 PM.  Appropriate orders placed.  MEYSON ROYE was informed that the remainder of the evaluation will be completed by another provider, this initial triage assessment does not replace that evaluation, and the importance of remaining in the ED until their evaluation is complete.  Bladder scan ordered   Su Monks, PA-C 11/14/22 1846

## 2022-11-14 NOTE — Discharge Instructions (Signed)
Your history, exam, workup today are consistent with urinary tract infection likely causing recurrent urinary retention.  The Foley catheter was placed and your symptoms were relieved.  The urine did show some leukocytes and bacteria and given the dysuria or painful urination you have for began, I do want to treat you for UTI.  A culture was sent.  If the infection is resistant to the antibiotic we prescribed, they should call you.  Please follow-up with your urology team and keep the catheter in place.  Please rest and stay hydrated.  If any symptoms change or worsen acutely, return to the emergency department.

## 2022-11-14 NOTE — ED Provider Notes (Signed)
[place] EMERGENCY DEPARTMENT AT [facility] Provider Note   CSN: [id number] Arrival date & time: 11/14/22  1809     History  Chief Complaint  Patient presents with   Urinary Retention    [name] is a 87 y.o. male.  The history is provided by the patient and medical records. No language interpreter was used.  Abdominal Pain Pain location:  Suprapubic Pain quality: aching   Pain radiates to:  Does not radiate Pain severity:  Severe Onset quality:  Gradual Duration:  1 day Timing:  Constant Progression:  Unchanged Chronicity:  Recurrent Relieved by:  Nothing Worsened by:  Nothing Ineffective treatments:  None tried Associated symptoms: dysuria   Associated symptoms: no chest pain, no chills, no constipation, no cough, no diarrhea, no fatigue, no nausea, no shortness of breath and no vomiting        Home Medications Prior to Admission medications   Medication Sig Start Date End Date Taking? Authorizing Provider  aspirin EC 325 MG tablet Take 1 tablet (325 mg total) by mouth daily. 09/22/22   Lorin Glass, MD  ferrous sulfate 325 (65 FE) MG tablet Take 1 tablet (325 mg total) by mouth daily with breakfast. 07/28/22   Burnadette Pop, MD  FLUoxetine (PROZAC) 20 MG capsule Take 20 mg by mouth at bedtime.     [provider]  furosemide (LASIX) 20 MG tablet Take 1 tablet (20 mg total) by mouth daily as needed for fluid. 12/28/16   Allred, Fayrene Fearing, MD  gabapentin (NEURONTIN) 600 MG tablet Take 600 mg by mouth daily. 07/17/22   [provider]  insulin lispro protamine-lispro (HUMALOG 75/25 MIX) (75-25) 100 UNIT/ML SUSP injection Inject 18 Units into the skin 2 (two) times daily with a meal.    [provider]  losartan (COZAAR) 50 MG tablet Take 50 mg by mouth daily.    [provider]  Melatonin 3 MG TABS Take 1 tablet by mouth at bedtime.     [provider]  metoprolol succinate (TOPROL-XL) 100 MG 24 hr tablet  Take 100 mg by mouth at bedtime.    [provider]  Multiple Vitamin (MULTIVITAMIN) tablet Take 1 tablet by mouth daily.    [provider]  nitroGLYCERIN (NITROSTAT) 0.4 MG SL tablet Place 1 tablet (0.4 mg total) under the tongue every 5 (five) minutes as needed. For chest pain 03/28/12   [name], Deloris Ping, MD  rosuvastatin (CRESTOR) 10 MG tablet Take 10 mg by mouth every morning.     [provider]  zolpidem (AMBIEN) 10 MG tablet Take 5 mg by mouth at bedtime.    [provider]      Allergies    Patient has no known allergies.    Review of Systems   Review of Systems  Constitutional:  Negative for chills and fatigue.  HENT:  Negative for congestion.   Respiratory:  Negative for cough, chest tightness and shortness of breath.   Cardiovascular:  Negative for chest pain.  Gastrointestinal:  Positive for abdominal pain. Negative for constipation, diarrhea, nausea and vomiting.  Genitourinary:  Positive for dysuria. Negative for flank pain and frequency.  Musculoskeletal:  Negative for back pain, neck pain and neck stiffness.  Skin:  Negative for rash and wound.  Neurological:  Negative for dizziness, light-headedness, numbness and headaches.  Psychiatric/Behavioral:  Negative for agitation and confusion.   All other systems reviewed and are negative.   Physical Exam Updated Vital Signs BP [name])  133/56 (BP Location: Left Arm)   Pulse 64   Temp 98.3 F (36.8 C) (Oral)   Resp 16   Ht 5\' 6"  (1.676 m)   Wt 66.5 kg   SpO2 97%   BMI 23.66 kg/m  Physical Exam Vitals and nursing note reviewed.  Constitutional:      General: He is not in acute distress.    Appearance: He is well-developed. He is not ill-appearing, toxic-appearing or diaphoretic.  HENT:     Head: Normocephalic and atraumatic.     Nose: Nose normal.     Mouth/Throat:     Mouth: Mucous membranes are moist.  Eyes:     Extraocular Movements: Extraocular movements intact.      Conjunctiva/sclera: Conjunctivae normal.     Pupils: Pupils are equal, round, and reactive to light.  Cardiovascular:     Rate and Rhythm: Normal rate and regular rhythm.     Heart sounds: No murmur heard. Pulmonary:     Effort: Pulmonary effort is normal. No respiratory distress.     Breath sounds: Normal breath sounds. No wheezing, rhonchi or rales.  Chest:     Chest wall: No tenderness.  Abdominal:     General: Abdomen is flat.     Palpations: Abdomen is soft.     Tenderness: There is abdominal tenderness.  Musculoskeletal:        General: No swelling or tenderness.     Cervical back: Neck supple.     Right lower leg: No edema.     Left lower leg: No edema.  Skin:    General: Skin is warm and dry.     Capillary Refill: Capillary refill takes less than 2 seconds.     Findings: No erythema.  Neurological:     General: No focal deficit present.     Mental Status: He is alert.  Psychiatric:        Mood and Affect: Mood normal.     ED Results / Procedures / Treatments   Labs (all labs ordered are listed, but only abnormal results are displayed) Labs Reviewed  URINALYSIS, W/ REFLEX TO CULTURE (INFECTION SUSPECTED) - Abnormal; Notable for the following components:      Result Value   APPearance HAZY (*)    Hgb urine dipstick LARGE (*)    Protein, ur 30 (*)    Leukocytes,Ua TRACE (*)    Bacteria, UA RARE (*)    All other components within normal limits  URINE CULTURE    EKG None  Radiology No results found.  Procedures BLADDER CATHETERIZATION  Date/Time: 11/14/2022 9:12 PM  Performed by: Heide Scales, MD Authorized by: Heide Scales, MD   Consent:    Consent obtained:  Verbal   Consent given by:  Patient   Risks, benefits, and alternatives were discussed: yes     Alternatives discussed:  No treatment Universal protocol:    Patient identity confirmed:  Verbally with patient Pre-procedure details:    Procedure purpose:  Therapeutic    Preparation: Patient was prepped and draped in usual sterile fashion   Anesthesia:    Anesthesia method:  Topical application   Topical anesthetic:  Lidocaine gel Procedure details:    Provider performed due to:  Nurse unable to complete   Catheter insertion:  Indwelling   Catheter type:  Coude   Number of attempts:  2   Urine characteristics:  Blood-tinged Post-procedure details:    Procedure completion:  Tolerated     Medications Ordered in  ED Medications - No data to display  ED Course/ Medical Decision Making/ A&P                                 Medical Decision Making Amount and/or Complexity of Data Reviewed Labs: ordered.  Risk Prescription drug management.    SHALE CLEAVENGER is a 87 y.o. male with a past medical history significant for CAD, previous mitral valve repair, previous aortic valve replacement, previous stroke, hypertension, diabetes, hyperlipidemia, and previous bladder outlet obstruction from prostate cancer who presents with dysuria yesterday followed by retention today.  According to patient, he has been having abdominal pain since yesterday and was only having dribbling dysuria yesterday.  He thinks he is obstructed again and needs a Foley catheter placed.  This happened over a month ago and symptoms improved with a Foley.  He thinks he needs one now.  He reports no pain before yesterday and overnight.  His last urination was yesterday.  He denies constipation and has had some loose stools.  Denies any other abdominal pains and denies any nausea, vomiting, fevers, or chills.  On exam, lungs clear.  Chest nontender.  Abdomen is tender in the suprapubic area and his flanks are nontender.  Patient had a bladder scan in triage and it was 900.  He denies any pain in his testicles or penis and deferred a GU exam initially.  We had a shared decision conversation and agreed for Foley catheter placement.  Nursing was unable to place it and I helped to place it  successfully.  Some blood-tinged urine initially came out but then urinalysis was obtained and it does show concern for UTI given the dysuria with leukocytes and many bacteria and greater than 50 white blood cells.  Patient given a dose of antibiotics and will get prescription for antibiotics and will go home with Foley catheter in place.  We discussed waiting for him to get other labs but given his otherwise resolution of symptoms and well appearance before this began, he did not want further workup.  Will follow-up with urology and patient was discharged in good condition.                  Final Clinical Impression(s) / ED Diagnoses Final diagnoses:  Urinary retention  Lower urinary tract infectious disease    Rx / DC Orders ED Discharge Orders          Ordered    cephALEXin (KEFLEX) 500 MG capsule  4 times daily        11/14/22 2146           Clinical Impression: 1. Urinary retention   2. Lower urinary tract infectious disease     Disposition: Discharge  Condition: Good  I have discussed the results, Dx and Tx plan with the pt(& family if present). He/she/they expressed understanding and agree(s) with the plan. Discharge instructions discussed at great length. Strict return precautions discussed and pt &/or family have verbalized understanding of the instructions. No further questions at time of discharge.    Discharge Medication List as of 11/14/2022  9:47 PM     START taking these medications   Details  cephALEXin (KEFLEX) 500 MG capsule Take 1 capsule (500 mg total) by mouth 4 (four) times daily for 10 days., Starting Tue 11/14/2022, Until Fri 11/24/2022, Print        Follow Up: Renford Dills, MD [address]  [address] [place] [address] [phone]     [facility] [address] [place] [address] [place] [address] [phone]    [facility] Emergency Department at [facility] [address] [place] [address] [phone]        Bionca Mckey, Canary Brim, MD 11/14/22 [phone]

## 2022-11-15 DIAGNOSIS — D649 Anemia, unspecified: Secondary | ICD-10-CM | POA: Diagnosis not present

## 2022-11-15 LAB — URINE CULTURE: Culture: NO GROWTH

## 2022-11-21 DIAGNOSIS — R338 Other retention of urine: Secondary | ICD-10-CM | POA: Diagnosis not present

## 2022-11-21 DIAGNOSIS — N401 Enlarged prostate with lower urinary tract symptoms: Secondary | ICD-10-CM | POA: Diagnosis not present

## 2022-11-22 DIAGNOSIS — Z23 Encounter for immunization: Secondary | ICD-10-CM | POA: Diagnosis not present

## 2022-11-29 DIAGNOSIS — H18593 Other hereditary corneal dystrophies, bilateral: Secondary | ICD-10-CM | POA: Diagnosis not present

## 2022-11-29 DIAGNOSIS — E119 Type 2 diabetes mellitus without complications: Secondary | ICD-10-CM | POA: Diagnosis not present

## 2022-11-29 DIAGNOSIS — H524 Presbyopia: Secondary | ICD-10-CM | POA: Diagnosis not present

## 2022-12-11 ENCOUNTER — Encounter: Payer: Self-pay | Admitting: Podiatry

## 2022-12-11 ENCOUNTER — Ambulatory Visit (INDEPENDENT_AMBULATORY_CARE_PROVIDER_SITE_OTHER): Payer: Medicare Other | Admitting: Podiatry

## 2022-12-11 DIAGNOSIS — M79675 Pain in left toe(s): Secondary | ICD-10-CM | POA: Diagnosis not present

## 2022-12-11 DIAGNOSIS — B351 Tinea unguium: Secondary | ICD-10-CM | POA: Diagnosis not present

## 2022-12-11 DIAGNOSIS — N3946 Mixed incontinence: Secondary | ICD-10-CM | POA: Diagnosis not present

## 2022-12-11 DIAGNOSIS — M79674 Pain in right toe(s): Secondary | ICD-10-CM

## 2022-12-11 DIAGNOSIS — R338 Other retention of urine: Secondary | ICD-10-CM | POA: Diagnosis not present

## 2022-12-11 NOTE — Progress Notes (Signed)
Subjective:  Patient ID: [name], male    DOB: Nov 29, 1935,  MRN: [id number]  [name] presents to clinic today for:  Chief Complaint  Patient presents with   Nail Problem    Nail trim   . Patient notes nails are thick, discolored, elongated and painful in shoegear when trying to ambulate.  He uses a cane for assistance with ambulation.  PCP is Renford Dills, MD.  No Known Allergies  Review of Systems: Negative except as noted in the HPI.  Objective:  [name] is a pleasant 87 y.o. male in NAD. AAO x 3.  Vascular Examination: Capillary refill time is 3-5 seconds to toes bilateral. Palpable pedal pulses b/l LE. Digital hair present b/l. No pedal edema b/l. Skin temperature gradient WNL b/l. No varicosities b/l. No cyanosis or clubbing noted b/l.   Dermatological Examination: Pedal skin with normal turgor, texture and tone b/l. No open wounds. No interdigital macerations b/l. Toenails x10 are 3mm thick, discolored, dystrophic with subungual debris. There is pain with compression of the nail plates.  They are elongated x10     Latest Ref Rng & Units 07/25/2022    6:09 AM  Hemoglobin A1C  Hemoglobin-A1c 4.8 - 5.6 % 7.0    Assessment/Plan: 1. Pain due to onychomycosis of toenails of both feet     The mycotic toenails were sharply debrided x10 with sterile nail nippers and a power debriding burr to decrease bulk/thickness and length.    Return in about 3 months (around 03/12/2023) for RFC.   Clerance Lav, DPM, FACFAS [facility]     [address] [place], [place] [address]                Office [phone]  Fax [phone]

## 2023-01-07 ENCOUNTER — Inpatient Hospital Stay (HOSPITAL_COMMUNITY)
Admission: EM | Admit: 2023-01-07 | Discharge: 2023-01-11 | DRG: 663 | Disposition: A | Discharge status: Home / Self Care | Payer: Medicare Other | Attending: Internal Medicine | Admitting: Internal Medicine

## 2023-01-07 ENCOUNTER — Emergency Department (HOSPITAL_COMMUNITY): Payer: Medicare Other

## 2023-01-07 ENCOUNTER — Other Ambulatory Visit: Payer: Self-pay

## 2023-01-07 ENCOUNTER — Inpatient Hospital Stay (HOSPITAL_COMMUNITY): Payer: Medicare Other

## 2023-01-07 ENCOUNTER — Encounter (HOSPITAL_COMMUNITY): Payer: Self-pay

## 2023-01-07 DIAGNOSIS — Z955 Presence of coronary angioplasty implant and graft: Secondary | ICD-10-CM

## 2023-01-07 DIAGNOSIS — M47811 Spondylosis without myelopathy or radiculopathy, occipito-atlanto-axial region: Secondary | ICD-10-CM | POA: Diagnosis not present

## 2023-01-07 DIAGNOSIS — N3289 Other specified disorders of bladder: Secondary | ICD-10-CM | POA: Diagnosis not present

## 2023-01-07 DIAGNOSIS — Y842 Radiological procedure and radiotherapy as the cause of abnormal reaction of the patient, or of later complication, without mention of misadventure at the time of the procedure: Secondary | ICD-10-CM | POA: Diagnosis present

## 2023-01-07 DIAGNOSIS — Z8546 Personal history of malignant neoplasm of prostate: Secondary | ICD-10-CM

## 2023-01-07 DIAGNOSIS — E1165 Type 2 diabetes mellitus with hyperglycemia: Secondary | ICD-10-CM | POA: Diagnosis not present

## 2023-01-07 DIAGNOSIS — Z807 Family history of other malignant neoplasms of lymphoid, hematopoietic and related tissues: Secondary | ICD-10-CM

## 2023-01-07 DIAGNOSIS — M4802 Spinal stenosis, cervical region: Secondary | ICD-10-CM | POA: Diagnosis not present

## 2023-01-07 DIAGNOSIS — N3000 Acute cystitis without hematuria: Secondary | ICD-10-CM

## 2023-01-07 DIAGNOSIS — Z8673 Personal history of transient ischemic attack (TIA), and cerebral infarction without residual deficits: Secondary | ICD-10-CM

## 2023-01-07 DIAGNOSIS — G9389 Other specified disorders of brain: Secondary | ICD-10-CM | POA: Diagnosis not present

## 2023-01-07 DIAGNOSIS — N3041 Irradiation cystitis with hematuria: Secondary | ICD-10-CM | POA: Diagnosis present

## 2023-01-07 DIAGNOSIS — N179 Acute kidney failure, unspecified: Secondary | ICD-10-CM | POA: Diagnosis not present

## 2023-01-07 DIAGNOSIS — I110 Hypertensive heart disease with heart failure: Secondary | ICD-10-CM | POA: Diagnosis present

## 2023-01-07 DIAGNOSIS — Z794 Long term (current) use of insulin: Secondary | ICD-10-CM

## 2023-01-07 DIAGNOSIS — Z953 Presence of xenogenic heart valve: Secondary | ICD-10-CM

## 2023-01-07 DIAGNOSIS — I6782 Cerebral ischemia: Secondary | ICD-10-CM | POA: Diagnosis not present

## 2023-01-07 DIAGNOSIS — B9620 Unspecified Escherichia coli [E. coli] as the cause of diseases classified elsewhere: Secondary | ICD-10-CM | POA: Diagnosis present

## 2023-01-07 DIAGNOSIS — K449 Diaphragmatic hernia without obstruction or gangrene: Secondary | ICD-10-CM | POA: Diagnosis not present

## 2023-01-07 DIAGNOSIS — Z951 Presence of aortocoronary bypass graft: Secondary | ICD-10-CM

## 2023-01-07 DIAGNOSIS — G250 Essential tremor: Secondary | ICD-10-CM | POA: Diagnosis present

## 2023-01-07 DIAGNOSIS — Z923 Personal history of irradiation: Secondary | ICD-10-CM

## 2023-01-07 DIAGNOSIS — G4733 Obstructive sleep apnea (adult) (pediatric): Secondary | ICD-10-CM | POA: Diagnosis present

## 2023-01-07 DIAGNOSIS — D62 Acute posthemorrhagic anemia: Secondary | ICD-10-CM

## 2023-01-07 DIAGNOSIS — D500 Iron deficiency anemia secondary to blood loss (chronic): Secondary | ICD-10-CM | POA: Diagnosis not present

## 2023-01-07 DIAGNOSIS — Z9079 Acquired absence of other genital organ(s): Secondary | ICD-10-CM

## 2023-01-07 DIAGNOSIS — N3001 Acute cystitis with hematuria: Secondary | ICD-10-CM

## 2023-01-07 DIAGNOSIS — R310 Gross hematuria: Secondary | ICD-10-CM | POA: Diagnosis present

## 2023-01-07 DIAGNOSIS — N3040 Irradiation cystitis without hematuria: Secondary | ICD-10-CM | POA: Diagnosis not present

## 2023-01-07 DIAGNOSIS — Z87891 Personal history of nicotine dependence: Secondary | ICD-10-CM

## 2023-01-07 DIAGNOSIS — Z8042 Family history of malignant neoplasm of prostate: Secondary | ICD-10-CM

## 2023-01-07 DIAGNOSIS — S0990XA Unspecified injury of head, initial encounter: Secondary | ICD-10-CM | POA: Diagnosis not present

## 2023-01-07 DIAGNOSIS — E785 Hyperlipidemia, unspecified: Secondary | ICD-10-CM | POA: Diagnosis present

## 2023-01-07 DIAGNOSIS — M47812 Spondylosis without myelopathy or radiculopathy, cervical region: Secondary | ICD-10-CM | POA: Diagnosis not present

## 2023-01-07 DIAGNOSIS — C61 Malignant neoplasm of prostate: Secondary | ICD-10-CM | POA: Diagnosis present

## 2023-01-07 DIAGNOSIS — E119 Type 2 diabetes mellitus without complications: Secondary | ICD-10-CM

## 2023-01-07 DIAGNOSIS — N3091 Cystitis, unspecified with hematuria: Secondary | ICD-10-CM

## 2023-01-07 DIAGNOSIS — Z823 Family history of stroke: Secondary | ICD-10-CM

## 2023-01-07 DIAGNOSIS — Z85828 Personal history of other malignant neoplasm of skin: Secondary | ICD-10-CM | POA: Diagnosis not present

## 2023-01-07 DIAGNOSIS — I5032 Chronic diastolic (congestive) heart failure: Secondary | ICD-10-CM | POA: Diagnosis present

## 2023-01-07 DIAGNOSIS — B961 Klebsiella pneumoniae [K. pneumoniae] as the cause of diseases classified elsewhere: Secondary | ICD-10-CM | POA: Diagnosis present

## 2023-01-07 DIAGNOSIS — I10 Essential (primary) hypertension: Secondary | ICD-10-CM | POA: Diagnosis not present

## 2023-01-07 DIAGNOSIS — R109 Unspecified abdominal pain: Secondary | ICD-10-CM | POA: Diagnosis not present

## 2023-01-07 DIAGNOSIS — E1151 Type 2 diabetes mellitus with diabetic peripheral angiopathy without gangrene: Secondary | ICD-10-CM | POA: Diagnosis not present

## 2023-01-07 DIAGNOSIS — Z7984 Long term (current) use of oral hypoglycemic drugs: Secondary | ICD-10-CM

## 2023-01-07 DIAGNOSIS — N401 Enlarged prostate with lower urinary tract symptoms: Secondary | ICD-10-CM | POA: Diagnosis present

## 2023-01-07 DIAGNOSIS — Z8249 Family history of ischemic heart disease and other diseases of the circulatory system: Secondary | ICD-10-CM | POA: Diagnosis not present

## 2023-01-07 DIAGNOSIS — Z7982 Long term (current) use of aspirin: Secondary | ICD-10-CM

## 2023-01-07 DIAGNOSIS — R296 Repeated falls: Secondary | ICD-10-CM | POA: Diagnosis present

## 2023-01-07 DIAGNOSIS — M1990 Unspecified osteoarthritis, unspecified site: Secondary | ICD-10-CM | POA: Diagnosis present

## 2023-01-07 DIAGNOSIS — I2510 Atherosclerotic heart disease of native coronary artery without angina pectoris: Secondary | ICD-10-CM | POA: Diagnosis not present

## 2023-01-07 DIAGNOSIS — D509 Iron deficiency anemia, unspecified: Secondary | ICD-10-CM | POA: Insufficient documentation

## 2023-01-07 DIAGNOSIS — Z820 Family history of epilepsy and other diseases of the nervous system: Secondary | ICD-10-CM

## 2023-01-07 DIAGNOSIS — Z1152 Encounter for screening for COVID-19: Secondary | ICD-10-CM | POA: Diagnosis not present

## 2023-01-07 DIAGNOSIS — Z981 Arthrodesis status: Secondary | ICD-10-CM

## 2023-01-07 DIAGNOSIS — Z79899 Other long term (current) drug therapy: Secondary | ICD-10-CM | POA: Diagnosis not present

## 2023-01-07 DIAGNOSIS — Z87442 Personal history of urinary calculi: Secondary | ICD-10-CM

## 2023-01-07 DIAGNOSIS — Z833 Family history of diabetes mellitus: Secondary | ICD-10-CM

## 2023-01-07 DIAGNOSIS — R319 Hematuria, unspecified: Secondary | ICD-10-CM | POA: Insufficient documentation

## 2023-01-07 DIAGNOSIS — S199XXA Unspecified injury of neck, initial encounter: Secondary | ICD-10-CM | POA: Diagnosis not present

## 2023-01-07 DIAGNOSIS — N411 Chronic prostatitis: Secondary | ICD-10-CM | POA: Diagnosis not present

## 2023-01-07 LAB — URINALYSIS, ROUTINE W REFLEX MICROSCOPIC
Bilirubin Urine: NEGATIVE
Glucose, UA: 50 mg/dL — AB
Ketones, ur: NEGATIVE mg/dL
Nitrite: NEGATIVE
Protein, ur: 100 mg/dL — AB
RBC / HPF: >50 RBC/hpf (ref 0–5)
Specific Gravity, Urine: 1.015 (ref 1.005–1.030)
WBC, UA: >50 WBC/hpf (ref 0–5)
pH: 6 (ref 5.0–8.0)

## 2023-01-07 LAB — CBC WITH DIFFERENTIAL/PLATELET
Abs Immature Granulocytes: 0.15 10*3/uL — ABNORMAL HIGH (ref 0.00–0.07)
Basophils Absolute: 0 10*3/uL (ref 0.0–0.1)
Basophils Relative: 0 %
Eosinophils Absolute: 0.1 10*3/uL (ref 0.0–0.5)
Eosinophils Relative: 0 %
HCT: 19.9 % — ABNORMAL LOW (ref 39.0–52.0)
Hemoglobin: 6.4 g/dL — CL (ref 13.0–17.0)
Immature Granulocytes: 1 %
Lymphocytes Relative: 9 %
Lymphs Abs: 1.2 10*3/uL (ref 0.7–4.0)
MCH: 29.8 pg (ref 26.0–34.0)
MCHC: 32.2 g/dL (ref 30.0–36.0)
MCV: 92.6 fL (ref 80.0–100.0)
Monocytes Absolute: 1.7 10*3/uL — ABNORMAL HIGH (ref 0.1–1.0)
Monocytes Relative: 13 %
Neutro Abs: 10.1 10*3/uL — ABNORMAL HIGH (ref 1.7–7.7)
Neutrophils Relative %: 77 %
Platelets: 248 10*3/uL (ref 150–400)
RBC: 2.15 MIL/uL — ABNORMAL LOW (ref 4.22–5.81)
RDW: 15.2 % (ref 11.5–15.5)
WBC: 13.2 10*3/uL — ABNORMAL HIGH (ref 4.0–10.5)
nRBC: 0 % (ref 0.0–0.2)

## 2023-01-07 LAB — RETICULOCYTES
Immature Retic Fract: 25.6 % — ABNORMAL HIGH (ref 2.3–15.9)
RBC.: 2.37 MIL/uL — ABNORMAL LOW (ref 4.22–5.81)
Retic Count, Absolute: 58.1 10*3/uL (ref 19.0–186.0)
Retic Ct Pct: 2.5 % (ref 0.4–3.1)

## 2023-01-07 LAB — COMPREHENSIVE METABOLIC PANEL
ALT: 25 U/L (ref 0–44)
AST: 32 U/L (ref 15–41)
Albumin: 3.3 g/dL — ABNORMAL LOW (ref 3.5–5.0)
Alkaline Phosphatase: 57 U/L (ref 38–126)
Anion gap: 9 (ref 5–15)
BUN: 29 mg/dL — ABNORMAL HIGH (ref 8–23)
CO2: 19 mmol/L — ABNORMAL LOW (ref 22–32)
Calcium: 8.3 mg/dL — ABNORMAL LOW (ref 8.9–10.3)
Chloride: 104 mmol/L (ref 98–111)
Creatinine, Ser: 1.65 mg/dL — ABNORMAL HIGH (ref 0.61–1.24)
GFR, Estimated: 41 mL/min — ABNORMAL LOW (ref >60)
Glucose, Bld: 216 mg/dL — ABNORMAL HIGH (ref 70–99)
Potassium: 3.9 mmol/L (ref 3.5–5.1)
Sodium: 132 mmol/L — ABNORMAL LOW (ref 135–145)
Total Bilirubin: 0.6 mg/dL (ref 0.3–1.2)
Total Protein: 6.9 g/dL (ref 6.5–8.1)

## 2023-01-07 LAB — PROTIME-INR
INR: 1.2 (ref 0.8–1.2)
INR: 1.3 — ABNORMAL HIGH (ref 0.8–1.2)
Prothrombin Time: 15.4 s — ABNORMAL HIGH (ref 11.4–15.2)
Prothrombin Time: 16 s — ABNORMAL HIGH (ref 11.4–15.2)

## 2023-01-07 LAB — PREPARE RBC (CROSSMATCH)

## 2023-01-07 LAB — HEMOGLOBIN AND HEMATOCRIT, BLOOD
HCT: 21.9 % — ABNORMAL LOW (ref 39.0–52.0)
Hemoglobin: 7 g/dL — ABNORMAL LOW (ref 13.0–17.0)

## 2023-01-07 LAB — SARS CORONAVIRUS 2 BY RT PCR: SARS Coronavirus 2 by RT PCR: NEGATIVE

## 2023-01-07 LAB — GLUCOSE, CAPILLARY: Glucose-Capillary: 184 mg/dL — ABNORMAL HIGH (ref 70–99)

## 2023-01-07 MED ORDER — ROSUVASTATIN CALCIUM 10 MG PO TABS
10.0000 mg | ORAL_TABLET | ORAL | Status: DC
  Administered 2023-01-08 – 2023-01-11 (×3): 10 mg via ORAL
  Filled 2023-01-07 (×3): qty 1

## 2023-01-07 MED ORDER — MELATONIN 5 MG PO TABS
5.0000 mg | ORAL_TABLET | Freq: Every day | ORAL | Status: DC
  Administered 2023-01-07 – 2023-01-10 (×4): 5 mg via ORAL
  Filled 2023-01-07 (×4): qty 1

## 2023-01-07 MED ORDER — INSULIN ASPART PROT & ASPART (70-30 MIX) 100 UNIT/ML ~~LOC~~ SUSP
14.0000 [IU] | Freq: Two times a day (BID) | SUBCUTANEOUS | Status: DC
  Administered 2023-01-08 – 2023-01-11 (×5): 14 [IU] via SUBCUTANEOUS
  Filled 2023-01-07 (×2): qty 10

## 2023-01-07 MED ORDER — ACETAMINOPHEN 325 MG PO TABS
650.0000 mg | ORAL_TABLET | Freq: Four times a day (QID) | ORAL | Status: DC | PRN
  Administered 2023-01-07 – 2023-01-11 (×5): 650 mg via ORAL
  Filled 2023-01-07 (×6): qty 2

## 2023-01-07 MED ORDER — ZOLPIDEM TARTRATE 5 MG PO TABS
2.5000 mg | ORAL_TABLET | Freq: Every evening | ORAL | Status: DC | PRN
  Administered 2023-01-07 – 2023-01-10 (×4): 2.5 mg via ORAL
  Filled 2023-01-07 (×4): qty 1

## 2023-01-07 MED ORDER — SODIUM CHLORIDE 0.9 % IV SOLN
2.0000 g | INTRAVENOUS | Status: DC
  Administered 2023-01-08 – 2023-01-10 (×3): 2 g via INTRAVENOUS
  Filled 2023-01-07 (×3): qty 20

## 2023-01-07 MED ORDER — CEFTRIAXONE SODIUM 2 G IJ SOLR
2.0000 g | Freq: Once | INTRAMUSCULAR | Status: AC
  Administered 2023-01-07: 2 g via INTRAVENOUS
  Filled 2023-01-07: qty 20

## 2023-01-07 MED ORDER — ONDANSETRON HCL 4 MG/2ML IJ SOLN: 4.0000 mg | Freq: Four times a day (QID) | INTRAMUSCULAR | Status: DC | PRN

## 2023-01-07 MED ORDER — SODIUM CHLORIDE 0.9% IV SOLUTION: Freq: Once | INTRAVENOUS | Status: AC

## 2023-01-07 MED ORDER — ONDANSETRON HCL 4 MG PO TABS: 4.0000 mg | ORAL_TABLET | Freq: Four times a day (QID) | ORAL | Status: DC | PRN

## 2023-01-07 MED ORDER — GUAIFENESIN 100 MG/5ML PO LIQD
5.0000 mL | ORAL | Status: DC | PRN
  Administered 2023-01-07 – 2023-01-08 (×2): 5 mL via ORAL
  Filled 2023-01-07 (×2): qty 10

## 2023-01-07 MED ORDER — FLUOXETINE HCL 20 MG PO CAPS
20.0000 mg | ORAL_CAPSULE | Freq: Every day | ORAL | Status: DC
  Administered 2023-01-07 – 2023-01-10 (×4): 20 mg via ORAL
  Filled 2023-01-07 (×4): qty 1

## 2023-01-07 MED ORDER — ACETAMINOPHEN 650 MG RE SUPP
650.0000 mg | Freq: Four times a day (QID) | RECTAL | Status: DC | PRN
  Filled 2023-01-07: qty 1

## 2023-01-07 MED ORDER — SODIUM CHLORIDE 0.9 % IV BOLUS
1000.0000 mL | Freq: Once | INTRAVENOUS | Status: AC
Start: 2023-01-07 — End: 2023-01-07
  Administered 2023-01-07: 1000 mL via INTRAVENOUS

## 2023-01-07 MED ORDER — METOPROLOL TARTRATE 5 MG/5ML IV SOLN: 5.0000 mg | INTRAVENOUS | Status: DC | PRN

## 2023-01-07 MED ORDER — SENNOSIDES-DOCUSATE SODIUM 8.6-50 MG PO TABS
1.0000 | ORAL_TABLET | Freq: Every evening | ORAL | Status: DC | PRN
  Administered 2023-01-10: 1 via ORAL
  Filled 2023-01-07: qty 1

## 2023-01-07 MED ORDER — TAMSULOSIN HCL 0.4 MG PO CAPS
0.4000 mg | ORAL_CAPSULE | Freq: Every day | ORAL | Status: DC
  Administered 2023-01-07 – 2023-01-11 (×4): 0.4 mg via ORAL
  Filled 2023-01-07 (×4): qty 1

## 2023-01-07 MED ORDER — INSULIN ASPART 100 UNIT/ML IJ SOLN
0.0000 [IU] | Freq: Three times a day (TID) | INTRAMUSCULAR | Status: DC
  Administered 2023-01-08 (×3): 3 [IU] via SUBCUTANEOUS
  Administered 2023-01-09: 2 [IU] via SUBCUTANEOUS
  Administered 2023-01-09 – 2023-01-10 (×2): 3 [IU] via SUBCUTANEOUS
  Administered 2023-01-10 – 2023-01-11 (×2): 2 [IU] via SUBCUTANEOUS
  Filled 2023-01-07: qty 0.15

## 2023-01-07 MED ORDER — INSULIN ASPART 100 UNIT/ML IJ SOLN
0.0000 [IU] | Freq: Every day | INTRAMUSCULAR | Status: DC
  Administered 2023-01-07: 3 [IU] via SUBCUTANEOUS
  Filled 2023-01-07: qty 0.05

## 2023-01-07 NOTE — ED Provider Triage Note (Signed)
Emergency Medicine Provider Triage Evaluation Note  [name] , a 87 y.o. male  was evaluated in triage.  Pt complains of hematuria . Self cath TID for incomplete bladder emptying. 2 weeks of hematuria. + generalized fatigue and weakness and 2 fall with head injury x 1 week. Feeling sig pain with cath.no blood thinners. Feeling cold. Hx of the same   Review of Systems  Positive: hematuria Negative: fever  Physical Exam  BP (!) 106/51 (BP Location: Right Arm)   Pulse 66   Temp 98.2 F (36.8 C) (Oral)   Resp 18   Ht 5\' 6"  (1.676 m)   Wt 65.8 kg   SpO2 100%   BMI 23.40 kg/m  Gen:   Awake, no distress  = Resp:  Normal effort  MSK:   Moves extremities without difficulty  Other:  Pale  Medical Decision Making  Medically screening exam initiated at 4:12 PM.  Appropriate orders placed.  THAWNG MARCHANT was informed that the remainder of the evaluation will be completed by another provider, this initial triage assessment does not replace that evaluation, and the importance of remaining in the ED until their evaluation is complete.     Arthor Captain, PA-C 01/07/23 1614

## 2023-01-07 NOTE — ED Triage Notes (Signed)
Patient is here for evaluation of hematuria X 2 weeks. Family reports patient has fallen twice, once on Tuesday and once on Thursday. Denies LOC but hit his head. Denies being on blood thinners, only takes 2 baby aspirin's per day. Pt self cath's at home.

## 2023-01-07 NOTE — ED Notes (Addendum)
RN has received report on pt, pt  not on cardiac monitor.    As this RN receiving report,  previous nurse reports pt temp has increased from 98.8 to 99.3 during the 15 vital sign recheck after blood. MD was not aware at the time, this RN went to MD Zammit to inform him, and he states okay that fine, and to continue the blood transfusion.   This RN has placed pt on cardiac monitor, pt only had 1 IV , and receiving blood products. This RN has placed a second IV.  Fluids that were previous ordered, were started but not running, this RN has now started those fluidsPt denies any other needs at this time, bed low and locked , rails x2 , call light in reach, pt on now on cardiac monitor, bp cuff and pulse ox.

## 2023-01-07 NOTE — Hospital Course (Signed)
Atrial flutter, bladder outlet obstruction,  HLD, HTN, T2DM, CAD, DJD, GERD, kidney stones, major depression, prostate cancer, MVR w/ bioprosthetic valve, skin cancer of the nose and trunk, spinal stenosis, h/o CVA, OSA not on CPAP   Admitted 6/29-09/19/2022   DM2, HTN, HLD, CHF, CAD/CABG/stent, carotid artery disease s/p CEA, bioprosthetic mitral valve, A-flutter on aspirin only,, stroke, GERD, esophageal stricture, depression, chronic anemia, OSA, spinal stenosis, nephrolithiasis, prostate cancer s/p radiation and TURP 2017

## 2023-01-07 NOTE — H&P (View-Only) (Signed)
I have been asked to see the patient by Dr. Bethann Berkshire for evaluation and management of gross hematuria.  History of present illness: 87 year old man with a history of prostate cancer s/p XRT and ADT in 2017 for high risk prostate cancer also with history of TURP and currently CIC 3x day comes in with several weeks of gross hematuria.  Hemoglobin 6.4 and he received 1 unit of pRBCs in the ED.  He is also been experiencing a cough over the last several weeks.  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 Berneice Heinrich.  He takes 2 baby aspirin's daily.  Most recent PSA in June 2024 undetectable. He developed urinary retention in August 2024 and was taught CIC in the office.  He was last seen at [facility] on 12/12/22.    Review of systems: +cough  Patient Active Problem List   Diagnosis Date Noted   Hematuria 01/07/2023   Acute cystitis 01/07/2023   Acute blood loss anemia (ABLA) 01/07/2023   Gross hematuria 09/17/2022   Severe sepsis (HCC) 07/25/2022   Generalized weakness 07/25/2022   Acute hypoxic respiratory failure (HCC) 07/25/2022   Elevated troponin 07/25/2022   Acute on chronic anemia 07/25/2022   Hyperlipidemia 07/25/2022   Depression 07/25/2022   Acute bronchitis 07/24/2022   Chronic heart failure with preserved ejection fraction (HCC) 04/23/2019   Essential tremor 08/23/2017   Peripheral arterial disease (HCC) 04/28/2016   Acute urinary retention 04/26/2015   Prostate cancer (HCC)    DM2 (diabetes mellitus, type 2) (HCC) 08/21/2014   Cerebral amyloid angiopathy (HCC) 08/21/2014   Hyperlipidemia with target low density lipoprotein (LDL) cholesterol less than 70 mg/dL 13/10/6576   History of mitral valve repair 05/18/2012   S/P aortic valve replacement with bioprosthetic valve 05/18/2012   Coronary artery disease 05/18/2012   Stroke (HCC) 05/18/2012   Essential hypertension 05/10/2012   Cerebral infarction (HCC)  05/09/2012   Corneal epithelial basement membrane dystrophy 03/06/2012   Diabetes mellitus (HCC) 03/06/2012   Occlusion and stenosis of carotid artery without mention of cerebral infarction 05/19/2011   Nuclear cataract, nonsenile 03/03/2011   CAD (coronary artery disease) 11/08/2010   Hx of aortic valve replacement (25mm CE Magna) 11/08/2010   Hx of mitral valve repair (28mm Simulus Ring) 11/08/2010    No current facility-administered medications on file prior to encounter.   Current Outpatient Medications on File Prior to Encounter  Medication Sig Dispense Refill   aspirin EC 81 MG tablet Take 162 mg by mouth daily. Swallow whole.     ferrous sulfate 325 (65 FE) MG tablet Take 1 tablet (325 mg total) by mouth daily with breakfast. 30 tablet 3   FLUoxetine (PROZAC) 20 MG capsule Take 20 mg by mouth at bedtime.      furosemide (LASIX) 40 MG tablet Take 40 mg by mouth daily.     gabapentin (NEURONTIN) 600 MG tablet Take 600 mg by mouth 2 (two) times daily.     insulin lispro protamine-lispro (HUMALOG 75/25 MIX) (75-25) 100 UNIT/ML SUSP injection Inject 16 Units into the skin 2 (two) times daily with a meal.     losartan (COZAAR) 50 MG tablet Take 50 mg by mouth daily.     Melatonin 3 MG TABS Take 1 tablet by mouth at bedtime.      metFORMIN (GLUCOPHAGE-XR) 500 MG 24 hr tablet Take 500 mg by mouth 2 (two) times daily with a meal.     metoprolol succinate (  TOPROL-XL) 100 MG 24 hr tablet Take 100 mg by mouth at bedtime.     Multiple Vitamin (MULTIVITAMIN) tablet Take 1 tablet by mouth daily.     nitroGLYCERIN (NITROSTAT) 0.4 MG SL tablet Place 1 tablet (0.4 mg total) under the tongue every 5 (five) minutes as needed. For chest pain (Patient taking differently: Place 0.4 mg under the tongue every 5 (five) minutes as needed for chest pain. For chest pain) 25 tablet 5   rosuvastatin (CRESTOR) 10 MG tablet Take 10 mg by mouth every morning.      tamsulosin (FLOMAX) 0.4 MG CAPS capsule Take 0.4 mg  by mouth daily.     zolpidem (AMBIEN) 5 MG tablet Take 2.5 mg by mouth at bedtime as needed for sleep.     aspirin EC 325 MG tablet Take 1 tablet (325 mg total) by mouth daily. (Patient not taking: Reported on 01/07/2023)     furosemide (LASIX) 20 MG tablet Take 1 tablet (20 mg total) by mouth daily as needed for fluid. (Patient not taking: Reported on 01/07/2023) 30 tablet 3    Past Medical History:  Diagnosis Date   Anemia    Anginal pain (HCC)    Atrial flutter (HCC)    Bladder outlet obstruction    Carotid artery occlusion    Coronary artery disease    post stents; prior CABG; s/p cath January 2014 with 2 VD, patent SVG to OM3 and patent SVG to PL patent   Degenerative disk disease    GERD (gastroesophageal reflux disease)    History of esophageal stricture    "has it stretched q once in awhile" (03/26/2012"   Hyperlipidemia    Hypertension    Kidney stones    Major depression    OSA (obstructive sleep apnea)    "have a mask; haven't used one in years" (03/26/2012)   Prostate cancer (HCC)    S/P mitral valve replacement with bioprosthetic valve 02/20/2014   Skin cancer of nose 2012   "right side" (03/26/2012)   Skin cancer of trunk    "left chest" (03/26/2012)   Spinal stenosis    Stroke (HCC) 2014   NO RESIDUAL PROBLEMS   Type II diabetes mellitus (HCC)    Valvular heart disease     Past Surgical History:  Procedure Laterality Date   A-FLUTTER ABLATION N/A 12/01/2016   Procedure: A-Flutter Ablation;  Surgeon: Hillis Range, MD;  Location: [facility];  Service: Cardiovascular;  Laterality: N/A;   ADENOIDECTOMY  1956   ANTERIOR CERVICAL DECOMP/DISCECTOMY FUSION  2000's   AORTIC VALVE REPLACEMENT  08/29/2010   25 mm CE Magna bioprosthetic - [facility]   APPENDECTOMY  1951   CARDIAC CATHETERIZATION  10/20/2003   Est. EF of 65% -- Critical disease in the mid to distal circumflex --  Preserved left ventricular  function --    CARDIAC VALVE REPLACEMENT     CAROTID  ENDARTERECTOMY  05/05/2003   right carotid endarterectomy   CERVICAL DISC SURGERY  1980's   CORONARY ANGIOPLASTY WITH STENT PLACEMENT  10/20/2003   Percutaneous coronary intervention/drug-eluding stent implantation, third marginal branch --  Percutaneous closure, right femoral artery -- Atherosclerotic coronary vascular disease, single vessel / Status post successful percutaneous coronary intervention/tandem drug -- eluding stent implantation proximal third marginal branch -- Typical angina was not reproduced with device insertion or balloon    CORONARY ARTERY BYPASS GRAFT  08/29/2010   CABG X2; SVG to PDA, SVG to OM2- [facility]   CYSTOSCOPY WITH  FULGERATION N/A 09/17/2022   Procedure: CYSTOSCOPY WITH FULGERATION AND CLOT EVACUATION;  Surgeon: Loletta Parish., MD;  Location: [facility];  Service: Urology;  Laterality: N/A;   ESOPHAGOGASTRODUODENOSCOPY (EGD) WITH PROPOFOL N/A 07/27/2022   Procedure: ESOPHAGOGASTRODUODENOSCOPY (EGD) WITH PROPOFOL;  Surgeon: Vida Rigger, MD;  Location: [facility] ENDOSCOPY;  Service: Gastroenterology;  Laterality: N/A;   HEMATOMA EVACUATION  05/09/2003   Evacuation of hematoma, right neck   LEFT HEART CATHETERIZATION WITH CORONARY ANGIOGRAM N/A 03/27/2012   Procedure: LEFT HEART CATHETERIZATION WITH CORONARY ANGIOGRAM;  Surgeon: Peter M Swaziland, MD;  Location: [facility] CATH LAB;  Service: Cardiovascular;  Laterality: N/A;   LUMBAR DISC SURGERY  1996   MITRAL VALVE REPAIR  08/23/2010   28 mm Simulus ring -[facility]   POSTERIOR FUSION LUMBAR SPINE  1997   PROSTATE BIOPSY     SHOULDER ARTHROSCOPY  01/07/2007   Left shoulder impingement, labral tear   SKIN CANCER EXCISION  1990's; 2012   "left chest & right nose" (03/26/2012)   TEE WITHOUT CARDIOVERSION N/A 05/15/2012   Procedure: TRANSESOPHAGEAL ECHOCARDIOGRAM (TEE);  Surgeon: Vesta Mixer, MD;  Location[facility] ENDOSCOPY;  Service: Cardiovascular;  Laterality: N/A;   TEE WITHOUT CARDIOVERSION N/A 12/01/2016   Procedure: TRANSESOPHAGEAL  ECHOCARDIOGRAM (TEE);  Surgeon: Pricilla Riffle, MD;  Location: [facility] ENDOSCOPY;  Service: Cardiovascular;  Laterality: N/A;   TONSILLECTOMY AND ADENOIDECTOMY  1956   TRANSURETHRAL RESECTION OF PROSTATE N/A 04/26/2015   Procedure: TRANSURETHRAL RESECTION OF THE PROSTATE WITH GYRUS INSTRUMENTS;  Surgeon: Crist Fat, MD;  Location: [facility];  Service: Urology;  Laterality: N/A;  TURP-BIPOLAR    Social History   Tobacco Use   Smoking status: Former    Current packs/day: 0.00    Average packs/day: 5.0 packs/day for 23.0 years (115.0 ttl pk-yrs)    Types: Cigarettes    Start date: 10/01/1955    Quit date: 10/01/1978    Years since quitting: 44.2   Smokeless tobacco: Never  Substance Use Topics   Alcohol use: No    Comment: 03/26/2012 "last alcohol was in 1970; never had problem w/it"   Drug use: No    Family History  Problem Relation Age of Onset   Diabetes Mother    Coronary artery disease Mother    Heart disease Mother    Stroke Father    Cancer Father        prostate   Cancer Daughter        lymphoma hodgkins    Seizures Daughter     PE: Vitals:   01/07/23 2046 01/07/23 2100 01/07/23 2221 01/07/23 2341  BP:  (!) 160/89 (!) 151/75 120/66  Pulse:  79 89 76  Resp:  20 14 16   Temp: 99.4 F (37.4 C)  (!) 102.9 F (39.4 C) 99 F (37.2 C)  TempSrc: Oral  Oral Oral  SpO2:  100% 94% 93%  Weight:   76.5 kg   Height:       Patient appears to be in pain and is holding his suprapubic area patient is alert and oriented x3 Atraumatic normocephalic head No cervical or supraclavicular lymphadenopathy appreciated No increased work of breathing, no audible wheezes/rhonchi Regular sinus rhythm/rate Abdomen: Suprapubic area tender to palpation GU: Circumflex phallus, 16 French indwelling Foley catheter with dark red urine in the tubing, will not irrigate Lower extremities are symmetric without appreciable edema Grossly neurologically intact No identifiable skin lesions  Recent Labs     01/07/23 1700  WBC 13.2*  HGB 6.4*  HCT 19.9*   Recent Labs    01/07/23 1700  NA 132*  K 3.9  CL 104  CO2 19*  GLUCOSE 216*  BUN 29*  CREATININE 1.65*  CALCIUM 8.3*   Recent Labs    01/07/23 1700  INR 1.2   No results for input(s): "LABURIN" in the last 72 hours. Results for orders placed or performed during the hospital encounter of 01/07/23  SARS Coronavirus 2 by RT PCR (hospital order, performed in [facility] hospital lab) *cepheid single result test* Anterior Nasal Swab     Status: None   Collection Time: 01/07/23  9:42 PM   Specimen: Anterior Nasal Swab  Result Value Ref Range Status   SARS Coronavirus 2 by RT PCR NEGATIVE NEGATIVE Final    Comment: (NOTE) SARS-CoV-2 target nucleic acids are NOT DETECTED.  The SARS-CoV-2 RNA is generally detectable in upper and lower respiratory specimens during the acute phase of infection. The lowest concentration of SARS-CoV-2 viral copies this assay can detect is 250 copies / mL. A negative result does not preclude SARS-CoV-2 infection and should not be used as the sole basis for treatment or other patient management decisions.  A negative result may occur with improper specimen collection / handling, submission of specimen other than nasopharyngeal swab, presence of viral mutation(s) within the areas targeted by this assay, and inadequate number of viral copies (<250 copies / mL). A negative result must be combined with clinical observations, patient history, and epidemiological information.  Fact Sheet for Patients:   RoadLapTop.co.za  Fact Sheet for Healthcare Providers: http://kim-miller.com/  This test is not yet approved or  cleared by the [place] FDA and has been authorized for detection and/or diagnosis of SARS-CoV-2 by FDA under an Emergency Use Authorization (EUA).  This EUA will remain in effect (meaning this test can be used) for the duration of  the COVID-19 declaration under Section 564(b)(1) of the Act, 21 U.S.C. section 360bbb-3(b)(1), unless the authorization is terminated or revoked sooner.  Performed at [facility], [address], [place], [place] [address]     Imaging: CT Abd/Pelvis 01/07/23 IMPRESSION: 1. Interval placement of a Foley catheter with its retaining balloon seen within the bladder lumen. Small amount of high density material is seen dependently within the bladder lumen surrounding the Foley catheter balloon likely representing a small blood clot. 2. Progressive mild peri-renal stranding, nonspecific, but suggesting an underlying inflammatory process involving the kidneys bilaterally. Correlation with renal function tests and possibly urinalysis and urine culture may be helpful. 3. Stable circumferential thickening of the rectum with mild presacral and perirectal edema suggesting changes of a chronic infectious or inflammatory proctitis. 4. Mild cardiomegaly.   Aortic Atherosclerosis (ICD10-I70.0).     Electronically Signed   By: Helyn Numbers M.D.   On: 01/07/2023 22:01  Procedure: Existing 16 French Foley catheter removed.  Patient prepped with Betadine.  A 20 French three-way Foley catheter was placed sterilely and inflated with 20 cc sterile water.  Patient's catheter was then irrigated with 500 cc of sterile water and minimal clot burden returned.  Continuous bladder irrigation was initiated with normal saline.  At the conclusion of irrigation and initiation of CBI urine was clear and very lightly pink-tinged.  It was draining well to gravity drainage.  Assessment/Plan: Gross hematuria: -Most likely radiation cystitis -Patient was transfused and will follow hemoglobin -CBI was initiated and will continue overnight -N.p.o. at midnight in the event patient needs intervention tomorrow    [facility]  D Male Iafrate

## 2023-01-07 NOTE — ED Notes (Signed)
.ED TO INPATIENT HANDOFF REPORT  Name/Age/Gender [name] 87 y.o. male  Code Status    Code Status Orders  (From admission, onward)           Start     Ordered   01/07/23 2047  Full code  Continuous       Question:  By:  Answer:  Consent: discussion documented in EHR   01/07/23 2047           Code Status History     Date Active Date Inactive Code Status Order ID Comments User Context   09/17/2022 0630 09/19/2022 2123 Full Code [id number]  Angie Fava, DO ED   07/24/2022 2209 07/27/2022 2033 Full Code [id number]  Angie Fava, DO ED   12/01/2016 1351 12/01/2016 2101 Full Code [id number]  Hillis Range, MD Inpatient   05/09/2012 1242 05/10/2012 2148 Full Code [id number]  Leroy Sea, MD ED      Advance Directive Documentation    Flowsheet Row Most Recent Value  Type of Advance Directive Healthcare Power of Attorney, Living will  Pre-existing out of facility DNR order (yellow form or pink MOST form) --  "MOST" Form in Place? --       Home/SNF/Other Home  Chief Complaint Acute blood loss anemia (ABLA) [D62]  Level of Care/Admitting Diagnosis ED Disposition     ED Disposition  Admit   Condition  --   Comment  Hospital Area: [facility] [[id number]]  Level of Care: Telemetry [5]  Admit to tele based on following criteria: Other see comments  Comments: severe anemia  May admit patient to [facility] or [facility] if equivalent level of care is available:: No  Covid Evaluation: Asymptomatic - no recent exposure (last 10 days) testing not required  Diagnosis: Acute blood loss anemia Janit Bern) [[id number]]  Admitting Physician: Gery Pray [[id number]]  Attending Physician: Gery Pray [[id number]]  Certification:: I certify this patient will need inpatient services for at least 2 midnights  Expected Medical Readiness: 01/09/2023          Medical History Past Medical History:  Diagnosis Date   Anemia    Anginal pain (HCC)     Atrial flutter (HCC)    Bladder outlet obstruction    Carotid artery occlusion    Coronary artery disease    post stents; prior CABG; s/p cath January 2014 with 2 VD, patent SVG to OM3 and patent SVG to PL patent   Degenerative disk disease    GERD (gastroesophageal reflux disease)    History of esophageal stricture    "has it stretched q once in awhile" (03/26/2012"   Hyperlipidemia    Hypertension    Kidney stones    Major depression    OSA (obstructive sleep apnea)    "have a mask; haven't used one in years" (03/26/2012)   Prostate cancer (HCC)    S/P mitral valve replacement with bioprosthetic valve 02/20/2014   Skin cancer of nose 2012   "right side" (03/26/2012)   Skin cancer of trunk    "left chest" (03/26/2012)   Spinal stenosis    Stroke (HCC) 2014   NO RESIDUAL PROBLEMS   Type II diabetes mellitus (HCC)    Valvular heart disease     Allergies No Known Allergies  IV Location/Drains/Wounds Patient Lines/Drains/Airways Status     Active Line/Drains/Airways     Name Placement date Placement time Site Days   Peripheral IV 01/07/23 20 G Left Antecubital 01/07/23  1707  Antecubital  less than 1   Urethral Catheter A Moore Coude 16 Fr. 01/07/23  1822  Coude  less than 1            Labs/Imaging Results for orders placed or performed during the hospital encounter of 01/07/23 (from the past 48 hour(s))  Comprehensive metabolic panel     Status: Abnormal   Collection Time: 01/07/23  5:00 PM  Result Value Ref Range   Sodium 132 (L) 135 - 145 mmol/L   Potassium 3.9 3.5 - 5.1 mmol/L   Chloride 104 98 - 111 mmol/L   CO2 19 (L) 22 - 32 mmol/L   Glucose, Bld 216 (H) 70 - 99 mg/dL    Comment: Glucose reference range applies only to samples taken after fasting for at least 8 hours.   BUN 29 (H) 8 - 23 mg/dL   Creatinine, Ser 4.09 (H) 0.61 - 1.24 mg/dL   Calcium 8.3 (L) 8.9 - 10.3 mg/dL   Total Protein 6.9 6.5 - 8.1 g/dL   Albumin 3.3 (L) 3.5 - 5.0 g/dL   AST 32 15 - 41 U/L    ALT 25 0 - 44 U/L   Alkaline Phosphatase 57 38 - 126 U/L   Total Bilirubin 0.6 0.3 - 1.2 mg/dL   GFR, Estimated 41 (L) >60 mL/min    Comment: (NOTE) Calculated using the CKD-EPI Creatinine Equation (2021)    Anion gap 9 5 - 15    Comment: Performed at [facility], [address] [place], [place] [address]  CBC with Differential     Status: Abnormal   Collection Time: 01/07/23  5:00 PM  Result Value Ref Range   WBC 13.2 (H) 4.0 - 10.5 K/uL   RBC 2.15 (L) 4.22 - 5.81 MIL/uL   Hemoglobin 6.4 (LL) 13.0 - 17.0 g/dL    Comment: REPEATED TO VERIFY THIS CRITICAL RESULT HAS VERIFIED AND BEEN CALLED TO GRANT, C RN BY XIONG,KONG ON 10 20 2024 AT 1719, AND HAS BEEN READ BACK.     HCT 19.9 (L) 39.0 - 52.0 %   MCV 92.6 80.0 - 100.0 fL   MCH 29.8 26.0 - 34.0 pg   MCHC 32.2 30.0 - 36.0 g/dL   RDW 47.8 29.5 - 62.1 %   Platelets 248 150 - 400 K/uL   nRBC 0.0 0.0 - 0.2 %   Neutrophils Relative % 77 %   Neutro Abs 10.1 (H) 1.7 - 7.7 K/uL   Lymphocytes Relative 9 %   Lymphs Abs 1.2 0.7 - 4.0 K/uL   Monocytes Relative 13 %   Monocytes Absolute 1.7 (H) 0.1 - 1.0 K/uL   Eosinophils Relative 0 %   Eosinophils Absolute 0.1 0.0 - 0.5 K/uL   Basophils Relative 0 %   Basophils Absolute 0.0 0.0 - 0.1 K/uL   Immature Granulocytes 1 %   Abs Immature Granulocytes 0.15 (H) 0.00 - 0.07 K/uL    Comment: Performed at [facility], [address] [place], [place] [address]  Protime-INR     Status: Abnormal   Collection Time: 01/07/23  5:00 PM  Result Value Ref Range   Prothrombin Time 15.4 (H) 11.4 - 15.2 seconds   INR 1.2 0.8 - 1.2    Comment: (NOTE) INR goal varies based on device and disease states. Performed at [facility], [address] [place], [place] [address]   Type and screen [facility]     Status: None (Preliminary result)  Collection Time: 01/07/23  5:00 PM  Result Value Ref Range   ABO/RH(D) A POS     Antibody Screen NEG    Sample Expiration 01/10/2023,2359    Unit Number [id number]    Blood Component Type RED CELLS,LR    Unit division 00    Status of Unit ISSUED    Transfusion Status OK TO TRANSFUSE    Crossmatch Result Compatible    Unit Number [id number]    Blood Component Type RED CELLS,LR    Unit division 00    Status of Unit ALLOCATED    Transfusion Status OK TO TRANSFUSE    Crossmatch Result Compatible    Unit Number [id number]    Blood Component Type RED CELLS,LR    Unit division 00    Status of Unit ALLOCATED    Transfusion Status OK TO TRANSFUSE    Crossmatch Result      Compatible Performed at [facility], [address] [place], [place] [address]   Prepare RBC (crossmatch)     Status: None   Collection Time: 01/07/23  5:23 PM  Result Value Ref Range   Order Confirmation      ORDER PROCESSED BY BLOOD BANK Performed at [facility], [address], [place], [place] [address]   Urinalysis, Routine w reflex microscopic -Urine, Catheterized; In and out catheter     Status: Abnormal   Collection Time: 01/07/23  6:39 PM  Result Value Ref Range   Color, Urine BROWN (A) YELLOW   APPearance CLOUDY (A) CLEAR   Specific Gravity, Urine 1.015 1.005 - 1.030   pH 6.0 5.0 - 8.0   Glucose, UA 50 (A) NEGATIVE mg/dL   Hgb urine dipstick LARGE (A) NEGATIVE   Bilirubin Urine NEGATIVE NEGATIVE   Ketones, ur NEGATIVE NEGATIVE mg/dL   Protein, ur 102 (A) NEGATIVE mg/dL   Nitrite NEGATIVE NEGATIVE   Leukocytes,Ua SMALL (A) NEGATIVE   RBC / HPF >50 0 - 5 RBC/hpf   WBC, UA >50 0 - 5 WBC/hpf   Bacteria, UA FEW (A) NONE SEEN   Squamous Epithelial / HPF 0-5 0 - 5 /HPF   WBC Clumps PRESENT    Mucus PRESENT     Comment: Performed at [facility], [address], [place], [place] [address]   CT Cervical Spine Wo Contrast  Result Date: 01/07/2023 CLINICAL DATA:  Poly trauma, blunt EXAM: CT CERVICAL SPINE WITHOUT  CONTRAST TECHNIQUE: Multidetector CT imaging of the cervical spine was performed without intravenous contrast. Multiplanar CT image reconstructions were also generated. RADIATION DOSE REDUCTION: This exam was performed according to the departmental dose-optimization program which includes automated exposure control, adjustment of the mA and/or kV according to patient size and/or use of iterative reconstruction technique. COMPARISON:  Cervical spine radiographs 08/22/2005. MRI cervical spine 07/25/2005 FINDINGS: Alignment: Slight anterior subluxation of C7 on T1, unchanged since prior study. Otherwise normal alignment. Normal alignment of the posterior elements. Skull base and vertebrae: Skull base appears intact. No vertebral compression deformities. No focal bone lesion or bone destruction. Soft tissues and spinal canal: No prevertebral soft tissue swelling. No abnormal paraspinal soft tissue mass or infiltration. Disc levels: Postoperative changes with anterior plate and screw fixation from C4 through C7 with intervertebral fusion. Fusion appears intact. Degenerative changes in the other interspace levels with disc space narrowing and osteophyte formation. Prominent degenerative changes at C1-2. Degenerative changes in the posterior facet joints. Upper chest: Lung apices are clear. Other: Vascular calcifications. IMPRESSION: 1.  No change in alignment since previous studies. No acute displaced fractures identified. 2. Anterior plate and screw fixation with intervertebral fusion from C4 through C7 appears intact. 3. Degenerative changes. Electronically Signed   By: Burman Nieves M.D.   On: 01/07/2023 20:12   CT Head Wo Contrast  Result Date: 01/07/2023 CLINICAL DATA:  Poly trauma, blunt EXAM: CT HEAD WITHOUT CONTRAST TECHNIQUE: Contiguous axial images were obtained from the base of the skull through the vertex without intravenous contrast. RADIATION DOSE REDUCTION: This exam was performed according to the  departmental dose-optimization program which includes automated exposure control, adjustment of the mA and/or kV according to patient size and/or use of iterative reconstruction technique. COMPARISON:  CT head 07/24/2022.  MRI brain 04/12/2016 FINDINGS: Brain: Diffuse cerebral atrophy. Ventricular dilatation consistent with central atrophy. Low-attenuation changes in the deep white matter consistent with small vessel ischemia. Small focal area of encephalomalacia in the right post frontal gyrus suggesting old infarct. No change since prior study. No abnormal extra-axial fluid collections. No mass effect or midline shift. Gray-white matter junctions are distinct. Basal cisterns are not effaced. No acute intracranial hemorrhage. Vascular: No hyperdense vessel or unexpected calcification. Skull: Normal. Negative for fracture or focal lesion. Sinuses/Orbits: No acute finding. Other: None. IMPRESSION: No acute intracranial abnormalities. Chronic atrophy and small vessel ischemic changes. Old focal infarct on the right. Electronically Signed   By: Burman Nieves M.D.   On: 01/07/2023 20:06    Pending Labs Unresulted Labs (From admission, onward)     Start     Ordered   01/08/23 0500  Comprehensive metabolic panel  Tomorrow morning,   R        01/07/23 2047   01/08/23 0500  Magnesium  Tomorrow morning,   R        01/07/23 2047   01/08/23 0500  Protime-INR  Tomorrow morning,   R        01/07/23 2047   01/07/23 2045  Iron and TIBC  (Anemia Panel (PNL))  Add-on,   AD        01/07/23 2044   01/07/23 2045  Reticulocytes  (Anemia Panel (PNL))  Add-on,   AD        01/07/23 2044   01/07/23 2045  Vitamin B12  (Anemia Panel (PNL))  Add-on,   AD        01/07/23 2044   01/07/23 2044  Ferritin  (Anemia Panel (PNL))  Add-on,   AD        01/07/23 2044   01/07/23 2044  Folate  (Anemia Panel (PNL))  Add-on,   AD        01/07/23 2044   01/07/23 2044  Hemoglobin and hematocrit, blood  Add-on,   AD        01/07/23  2044   01/07/23 2036  Culture, blood (Routine X 2) w Reflex to ID Panel  BLOOD CULTURE X 2,   R (with STAT occurrences)      01/07/23 2035   01/07/23 1723  Urine Culture  Once,   URGENT       Question:  Indication  Answer:  Dysuria   01/07/23 1722            Vitals/Pain Today's Vitals   01/07/23 1917 01/07/23 2004 01/07/23 2046 01/07/23 2100  BP: (!) 130/55 (!) 153/67  (!) 160/89  Pulse: 73 76  79  Resp: 16 (!) 27  20  Temp: 99.3 F (37.4 C) 98.2 F (36.8 C) 99.4 F (37.4  C)   TempSrc: Oral Oral Oral   SpO2: 98% 97%  100%  Weight:      Height:      PainSc:        Isolation Precautions No active isolations  Medications Medications  0.9 %  sodium chloride infusion (Manually program via Guardrails IV Fluids) (has no administration in time range)  insulin aspart (novoLOG) injection 0-15 Units (has no administration in time range)  insulin aspart (novoLOG) injection 0-5 Units (has no administration in time range)  cefTRIAXone (ROCEPHIN) 2 g in sodium chloride 0.9 % 100 mL IVPB (has no administration in time range)  acetaminophen (TYLENOL) tablet 650 mg (has no administration in time range)    Or  acetaminophen (TYLENOL) suppository 650 mg (has no administration in time range)  ondansetron (ZOFRAN) tablet 4 mg (has no administration in time range)    Or  ondansetron (ZOFRAN) injection 4 mg (has no administration in time range)  senna-docusate (Senokot-S) tablet 1 tablet (has no administration in time range)  sodium chloride 0.9 % bolus 1,000 mL (1,000 mLs Intravenous New Bag/Given 01/07/23 1828)  cefTRIAXone (ROCEPHIN) 2 g in sodium chloride 0.9 % 100 mL IVPB (0 g Intravenous Stopped 01/07/23 1936)    Mobility walks with person assist

## 2023-01-07 NOTE — Consult Note (Addendum)
I have been asked to see the patient by Dr. Bethann Berkshire for evaluation and management of gross hematuria.  History of present illness: 87 year old man with a history of prostate cancer s/p XRT and ADT in 2017 for high risk prostate cancer also with history of TURP and currently CIC 3x day comes in with several weeks of gross hematuria.  Hemoglobin 6.4 and he received 1 unit of pRBCs in the ED.  He is also been experiencing a cough over the last several weeks.  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 Berneice Heinrich.  He takes 2 baby aspirin's daily.  Most recent PSA in June 2024 undetectable. He developed urinary retention in August 2024 and was taught CIC in the office.  He was last seen at [facility] on 12/12/22.    Review of systems: +cough  Patient Active Problem List   Diagnosis Date Noted   Hematuria 01/07/2023   Acute cystitis 01/07/2023   Acute blood loss anemia (ABLA) 01/07/2023   Gross hematuria 09/17/2022   Severe sepsis (HCC) 07/25/2022   Generalized weakness 07/25/2022   Acute hypoxic respiratory failure (HCC) 07/25/2022   Elevated troponin 07/25/2022   Acute on chronic anemia 07/25/2022   Hyperlipidemia 07/25/2022   Depression 07/25/2022   Acute bronchitis 07/24/2022   Chronic heart failure with preserved ejection fraction (HCC) 04/23/2019   Essential tremor 08/23/2017   Peripheral arterial disease (HCC) 04/28/2016   Acute urinary retention 04/26/2015   Prostate cancer (HCC)    DM2 (diabetes mellitus, type 2) (HCC) 08/21/2014   Cerebral amyloid angiopathy (HCC) 08/21/2014   Hyperlipidemia with target low density lipoprotein (LDL) cholesterol less than 70 mg/dL 13/10/6576   History of mitral valve repair 05/18/2012   S/P aortic valve replacement with bioprosthetic valve 05/18/2012   Coronary artery disease 05/18/2012   Stroke (HCC) 05/18/2012   Essential hypertension 05/10/2012   Cerebral infarction (HCC)  05/09/2012   Corneal epithelial basement membrane dystrophy 03/06/2012   Diabetes mellitus (HCC) 03/06/2012   Occlusion and stenosis of carotid artery without mention of cerebral infarction 05/19/2011   Nuclear cataract, nonsenile 03/03/2011   CAD (coronary artery disease) 11/08/2010   Hx of aortic valve replacement (25mm CE Magna) 11/08/2010   Hx of mitral valve repair (28mm Simulus Ring) 11/08/2010    No current facility-administered medications on file prior to encounter.   Current Outpatient Medications on File Prior to Encounter  Medication Sig Dispense Refill   aspirin EC 81 MG tablet Take 162 mg by mouth daily. Swallow whole.     ferrous sulfate 325 (65 FE) MG tablet Take 1 tablet (325 mg total) by mouth daily with breakfast. 30 tablet 3   FLUoxetine (PROZAC) 20 MG capsule Take 20 mg by mouth at bedtime.      furosemide (LASIX) 40 MG tablet Take 40 mg by mouth daily.     gabapentin (NEURONTIN) 600 MG tablet Take 600 mg by mouth 2 (two) times daily.     insulin lispro protamine-lispro (HUMALOG 75/25 MIX) (75-25) 100 UNIT/ML SUSP injection Inject 16 Units into the skin 2 (two) times daily with a meal.     losartan (COZAAR) 50 MG tablet Take 50 mg by mouth daily.     Melatonin 3 MG TABS Take 1 tablet by mouth at bedtime.      metFORMIN (GLUCOPHAGE-XR) 500 MG 24 hr tablet Take 500 mg by mouth 2 (two) times daily with a meal.     metoprolol succinate (  TOPROL-XL) 100 MG 24 hr tablet Take 100 mg by mouth at bedtime.     Multiple Vitamin (MULTIVITAMIN) tablet Take 1 tablet by mouth daily.     nitroGLYCERIN (NITROSTAT) 0.4 MG SL tablet Place 1 tablet (0.4 mg total) under the tongue every 5 (five) minutes as needed. For chest pain (Patient taking differently: Place 0.4 mg under the tongue every 5 (five) minutes as needed for chest pain. For chest pain) 25 tablet 5   rosuvastatin (CRESTOR) 10 MG tablet Take 10 mg by mouth every morning.      tamsulosin (FLOMAX) 0.4 MG CAPS capsule Take 0.4 mg  by mouth daily.     zolpidem (AMBIEN) 5 MG tablet Take 2.5 mg by mouth at bedtime as needed for sleep.     aspirin EC 325 MG tablet Take 1 tablet (325 mg total) by mouth daily. (Patient not taking: Reported on 01/07/2023)     furosemide (LASIX) 20 MG tablet Take 1 tablet (20 mg total) by mouth daily as needed for fluid. (Patient not taking: Reported on 01/07/2023) 30 tablet 3    Past Medical History:  Diagnosis Date   Anemia    Anginal pain (HCC)    Atrial flutter (HCC)    Bladder outlet obstruction    Carotid artery occlusion    Coronary artery disease    post stents; prior CABG; s/p cath January 2014 with 2 VD, patent SVG to OM3 and patent SVG to PL patent   Degenerative disk disease    GERD (gastroesophageal reflux disease)    History of esophageal stricture    "has it stretched q once in awhile" (03/26/2012"   Hyperlipidemia    Hypertension    Kidney stones    Major depression    OSA (obstructive sleep apnea)    "have a mask; haven't used one in years" (03/26/2012)   Prostate cancer (HCC)    S/P mitral valve replacement with bioprosthetic valve 02/20/2014   Skin cancer of nose 2012   "right side" (03/26/2012)   Skin cancer of trunk    "left chest" (03/26/2012)   Spinal stenosis    Stroke (HCC) 2014   NO RESIDUAL PROBLEMS   Type II diabetes mellitus (HCC)    Valvular heart disease     Past Surgical History:  Procedure Laterality Date   A-FLUTTER ABLATION N/A 12/01/2016   Procedure: A-Flutter Ablation;  Surgeon: Hillis Range, MD;  Location: [facility];  Service: Cardiovascular;  Laterality: N/A;   ADENOIDECTOMY  1956   ANTERIOR CERVICAL DECOMP/DISCECTOMY FUSION  2000's   AORTIC VALVE REPLACEMENT  08/29/2010   25 mm CE Magna bioprosthetic - [facility]   APPENDECTOMY  1951   CARDIAC CATHETERIZATION  10/20/2003   Est. EF of 65% -- Critical disease in the mid to distal circumflex --  Preserved left ventricular  function --    CARDIAC VALVE REPLACEMENT     CAROTID  ENDARTERECTOMY  05/05/2003   right carotid endarterectomy   CERVICAL DISC SURGERY  1980's   CORONARY ANGIOPLASTY WITH STENT PLACEMENT  10/20/2003   Percutaneous coronary intervention/drug-eluding stent implantation, third marginal branch --  Percutaneous closure, right femoral artery -- Atherosclerotic coronary vascular disease, single vessel / Status post successful percutaneous coronary intervention/tandem drug -- eluding stent implantation proximal third marginal branch -- Typical angina was not reproduced with device insertion or balloon    CORONARY ARTERY BYPASS GRAFT  08/29/2010   CABG X2; SVG to PDA, SVG to OM2- [facility]   CYSTOSCOPY WITH  FULGERATION N/A 09/17/2022   Procedure: CYSTOSCOPY WITH FULGERATION AND CLOT EVACUATION;  Surgeon: Loletta Parish., MD;  Location: [facility];  Service: Urology;  Laterality: N/A;   ESOPHAGOGASTRODUODENOSCOPY (EGD) WITH PROPOFOL N/A 07/27/2022   Procedure: ESOPHAGOGASTRODUODENOSCOPY (EGD) WITH PROPOFOL;  Surgeon: Vida Rigger, MD;  Location: [facility] ENDOSCOPY;  Service: Gastroenterology;  Laterality: N/A;   HEMATOMA EVACUATION  05/09/2003   Evacuation of hematoma, right neck   LEFT HEART CATHETERIZATION WITH CORONARY ANGIOGRAM N/A 03/27/2012   Procedure: LEFT HEART CATHETERIZATION WITH CORONARY ANGIOGRAM;  Surgeon: Peter M Swaziland, MD;  Location: [facility] CATH LAB;  Service: Cardiovascular;  Laterality: N/A;   LUMBAR DISC SURGERY  1996   MITRAL VALVE REPAIR  08/23/2010   28 mm Simulus ring -[facility]   POSTERIOR FUSION LUMBAR SPINE  1997   PROSTATE BIOPSY     SHOULDER ARTHROSCOPY  01/07/2007   Left shoulder impingement, labral tear   SKIN CANCER EXCISION  1990's; 2012   "left chest & right nose" (03/26/2012)   TEE WITHOUT CARDIOVERSION N/A 05/15/2012   Procedure: TRANSESOPHAGEAL ECHOCARDIOGRAM (TEE);  Surgeon: Vesta Mixer, MD;  Location[facility] ENDOSCOPY;  Service: Cardiovascular;  Laterality: N/A;   TEE WITHOUT CARDIOVERSION N/A 12/01/2016   Procedure: TRANSESOPHAGEAL  ECHOCARDIOGRAM (TEE);  Surgeon: Pricilla Riffle, MD;  Location: [facility] ENDOSCOPY;  Service: Cardiovascular;  Laterality: N/A;   TONSILLECTOMY AND ADENOIDECTOMY  1956   TRANSURETHRAL RESECTION OF PROSTATE N/A 04/26/2015   Procedure: TRANSURETHRAL RESECTION OF THE PROSTATE WITH GYRUS INSTRUMENTS;  Surgeon: Crist Fat, MD;  Location: [facility];  Service: Urology;  Laterality: N/A;  TURP-BIPOLAR    Social History   Tobacco Use   Smoking status: Former    Current packs/day: 0.00    Average packs/day: 5.0 packs/day for 23.0 years (115.0 ttl pk-yrs)    Types: Cigarettes    Start date: 10/01/1955    Quit date: 10/01/1978    Years since quitting: 44.2   Smokeless tobacco: Never  Substance Use Topics   Alcohol use: No    Comment: 03/26/2012 "last alcohol was in 1970; never had problem w/it"   Drug use: No    Family History  Problem Relation Age of Onset   Diabetes Mother    Coronary artery disease Mother    Heart disease Mother    Stroke Father    Cancer Father        prostate   Cancer Daughter        lymphoma hodgkins    Seizures Daughter     PE: Vitals:   01/07/23 2046 01/07/23 2100 01/07/23 2221 01/07/23 2341  BP:  (!) 160/89 (!) 151/75 120/66  Pulse:  79 89 76  Resp:  20 14 16   Temp: 99.4 F (37.4 C)  (!) 102.9 F (39.4 C) 99 F (37.2 C)  TempSrc: Oral  Oral Oral  SpO2:  100% 94% 93%  Weight:   76.5 kg   Height:       Patient appears to be in pain and is holding his suprapubic area patient is alert and oriented x3 Atraumatic normocephalic head No cervical or supraclavicular lymphadenopathy appreciated No increased work of breathing, no audible wheezes/rhonchi Regular sinus rhythm/rate Abdomen: Suprapubic area tender to palpation GU: Circumflex phallus, 16 French indwelling Foley catheter with dark red urine in the tubing, will not irrigate Lower extremities are symmetric without appreciable edema Grossly neurologically intact No identifiable skin lesions  Recent Labs     01/07/23 1700  WBC 13.2*  HGB 6.4*  HCT 19.9*   Recent Labs    01/07/23 1700  NA 132*  K 3.9  CL 104  CO2 19*  GLUCOSE 216*  BUN 29*  CREATININE 1.65*  CALCIUM 8.3*   Recent Labs    01/07/23 1700  INR 1.2   No results for input(s): "LABURIN" in the last 72 hours. Results for orders placed or performed during the hospital encounter of 01/07/23  SARS Coronavirus 2 by RT PCR (hospital order, performed in [facility] hospital lab) *cepheid single result test* Anterior Nasal Swab     Status: None   Collection Time: 01/07/23  9:42 PM   Specimen: Anterior Nasal Swab  Result Value Ref Range Status   SARS Coronavirus 2 by RT PCR NEGATIVE NEGATIVE Final    Comment: (NOTE) SARS-CoV-2 target nucleic acids are NOT DETECTED.  The SARS-CoV-2 RNA is generally detectable in upper and lower respiratory specimens during the acute phase of infection. The lowest concentration of SARS-CoV-2 viral copies this assay can detect is 250 copies / mL. A negative result does not preclude SARS-CoV-2 infection and should not be used as the sole basis for treatment or other patient management decisions.  A negative result may occur with improper specimen collection / handling, submission of specimen other than nasopharyngeal swab, presence of viral mutation(s) within the areas targeted by this assay, and inadequate number of viral copies (<250 copies / mL). A negative result must be combined with clinical observations, patient history, and epidemiological information.  Fact Sheet for Patients:   RoadLapTop.co.za  Fact Sheet for Healthcare Providers: http://kim-miller.com/  This test is not yet approved or  cleared by the [place] FDA and has been authorized for detection and/or diagnosis of SARS-CoV-2 by FDA under an Emergency Use Authorization (EUA).  This EUA will remain in effect (meaning this test can be used) for the duration of  the COVID-19 declaration under Section 564(b)(1) of the Act, 21 U.S.C. section 360bbb-3(b)(1), unless the authorization is terminated or revoked sooner.  Performed at [facility], [address], [place], [place] [address]     Imaging: CT Abd/Pelvis 01/07/23 IMPRESSION: 1. Interval placement of a Foley catheter with its retaining balloon seen within the bladder lumen. Small amount of high density material is seen dependently within the bladder lumen surrounding the Foley catheter balloon likely representing a small blood clot. 2. Progressive mild peri-renal stranding, nonspecific, but suggesting an underlying inflammatory process involving the kidneys bilaterally. Correlation with renal function tests and possibly urinalysis and urine culture may be helpful. 3. Stable circumferential thickening of the rectum with mild presacral and perirectal edema suggesting changes of a chronic infectious or inflammatory proctitis. 4. Mild cardiomegaly.   Aortic Atherosclerosis (ICD10-I70.0).     Electronically Signed   By: Helyn Numbers M.D.   On: 01/07/2023 22:01  Procedure: Existing 16 French Foley catheter removed.  Patient prepped with Betadine.  A 20 French three-way Foley catheter was placed sterilely and inflated with 20 cc sterile water.  Patient's catheter was then irrigated with 500 cc of sterile water and minimal clot burden returned.  Continuous bladder irrigation was initiated with normal saline.  At the conclusion of irrigation and initiation of CBI urine was clear and very lightly pink-tinged.  It was draining well to gravity drainage.  Assessment/Plan: Gross hematuria: -Most likely radiation cystitis -Patient was transfused and will follow hemoglobin -CBI was initiated and will continue overnight -N.p.o. at midnight in the event patient needs intervention tomorrow    [facility]  D Male Iafrate

## 2023-01-07 NOTE — H&P (Incomplete)
PCP:   Renford Dills, MD   Chief Complaint:  Gross hematuria  HPI: This is a 87 year old male with past medical history of DM2, HTN, HLD, CHF, CAD/CABG/stent, carotid artery disease s/p CEA, bioprosthetic mitral valve, A-flutter on aspirin only,, stroke, GERD, esophageal stricture, depression, chronic anemia, OSA, spinal stenosis, nephrolithiasis, prostate cancer s/p radiation and TURP 2017.  Patient self caths 3 times daily.  He presents with complaints of gross hematuria ongoing for approximately 2 weeks.  He states he has bright red blood as well as lots of clots.  He denies fever but endorses chills.  He denies nausea or vomiting but endorses mild right-sided flank discomfort every now and then.  He states he been getting weaker and has fallen twice Tuesday and Thursday.  He did hit his head but denies neck pain.  He states he is not lightheaded or dizzy just weak.  He denies chest pains.  He lives at home with his wife and uses a walker at baseline.    In the ER patient is hemodynamically stable.  Foley placed has return of gross hematuria.  Hemoglobin 6.4.  Platelets normal.  INR 1.2.  UA hemoglobin larger, leukocyte small ketones negative, bacteria few.  Urine culture, blood cultures ordered blood transfusion ordered, anemia panel ordered.  Urology consult placed by epic by EDP.  Review of Systems:  Per HPI  Past Medical History: Past Medical History:  Diagnosis Date   Anemia    Anginal pain (HCC)    Atrial flutter (HCC)    Bladder outlet obstruction    Carotid artery occlusion    Coronary artery disease    post stents; prior CABG; s/p cath January 2014 with 2 VD, patent SVG to OM3 and patent SVG to PL patent   Degenerative disk disease    GERD (gastroesophageal reflux disease)    History of esophageal stricture    "has it stretched q once in awhile" (03/26/2012"   Hyperlipidemia    Hypertension    Kidney stones    Major depression    OSA (obstructive sleep apnea)    "have a  mask; haven't used one in years" (03/26/2012)   Prostate cancer (HCC)    S/P mitral valve replacement with bioprosthetic valve 02/20/2014   Skin cancer of nose 2012   "right side" (03/26/2012)   Skin cancer of trunk    "left chest" (03/26/2012)   Spinal stenosis    Stroke (HCC) 2014   NO RESIDUAL PROBLEMS   Type II diabetes mellitus (HCC)    Valvular heart disease    Past Surgical History:  Procedure Laterality Date   A-FLUTTER ABLATION N/A 12/01/2016   Procedure: A-Flutter Ablation;  Surgeon: Hillis Range, MD;  Location: [facility];  Service: Cardiovascular;  Laterality: N/A;   ADENOIDECTOMY  1956   ANTERIOR CERVICAL DECOMP/DISCECTOMY FUSION  2000's   AORTIC VALVE REPLACEMENT  08/29/2010   25 mm CE Magna bioprosthetic - [facility]   APPENDECTOMY  1951   CARDIAC CATHETERIZATION  10/20/2003   Est. EF of 65% -- Critical disease in the mid to distal circumflex --  Preserved left ventricular  function --    CARDIAC VALVE REPLACEMENT     CAROTID ENDARTERECTOMY  05/05/2003   right carotid endarterectomy   CERVICAL DISC SURGERY  1980's   CORONARY ANGIOPLASTY WITH STENT PLACEMENT  10/20/2003   Percutaneous coronary intervention/drug-eluding stent implantation, third marginal branch --  Percutaneous closure, right femoral artery -- Atherosclerotic coronary vascular disease, single vessel / Status post  successful percutaneous coronary intervention/tandem drug -- eluding stent implantation proximal third marginal branch -- Typical angina was not reproduced with device insertion or balloon    CORONARY ARTERY BYPASS GRAFT  08/29/2010   CABG X2; SVG to PDA, SVG to OM2- [facility]   CYSTOSCOPY WITH FULGERATION N/A 09/17/2022   Procedure: CYSTOSCOPY WITH FULGERATION AND CLOT EVACUATION;  Surgeon: Loletta Parish., MD;  Location: [facility];  Service: Urology;  Laterality: N/A;   ESOPHAGOGASTRODUODENOSCOPY (EGD) WITH PROPOFOL N/A 07/27/2022   Procedure: ESOPHAGOGASTRODUODENOSCOPY (EGD) WITH PROPOFOL;   Surgeon: Vida Rigger, MD;  Location: [facility] ENDOS[facility];  Service: Gastroenterology;  Laterality: N/A;   HEMATOMA EVACUATION  05/09/2003   Evacuation of hematoma, right neck   LEFT HEART CATHETERIZATION WITH CORONARY ANGIOGRAM N/A 03/27/2012   Procedure: LEFT HEART CATHETERIZATION WITH CORONARY ANGIOGRAM;  Surgeon: Peter M Swaziland, MD;  Location: [facility] CATH LAB;  Service: Cardiovascular;  Laterality: N/A;   LUMBAR DISC SURGERY  1996   MITRAL VALVE REPAIR  08/23/2010   28 mm Simulus ring -[facility]   POSTERIOR FUSION LUMBAR SPINE  1997   PROSTATE BIOPSY     SHOULDER ARTHROSCOPY  01/07/2007   Left shoulder impingement, labral tear   SKIN CANCER EXCISION  1990's; 2012   "left chest & right nose" (03/26/2012)   TEE WITHOUT CARDIOVERSION N/A 05/15/2012   Procedure: TRANSESOPHAGEAL ECHOCARDIOGRAM (TEE);  Surgeon: Vesta Mixer, MD;  Location: [facility] ENDOSCOPY;  Service: Cardiovascular;  Laterality: N/A;   TEE WITHOUT CARDIOVERSION N/A 12/01/2016   Procedure: TRANSESOPHAGEAL ECHOCARDIOGRAM (TEE);  Surgeon: Pricilla Riffle, MD;  Location: [facility] ENDOSCOPY;  Service: Cardiovascular;  Laterality: N/A;   TONSILLECTOMY AND ADENOIDECTOMY  1956   TRANSURETHRAL RESECTION OF PROSTATE N/A 04/26/2015   Procedure: TRANSURETHRAL RESECTION OF THE PROSTATE WITH GYRUS INSTRUMENTS;  Surgeon: Crist Fat, MD;  Location: [facility] OR[facility];  Service: Urology;  Laterality: N/A;  TURP-BIPOLAR    Medications: Prior to Admission medications   Medication Sig Start Date End Date Taking? Authorizing Provider  aspirin EC 81 MG tablet Take 162 mg by mouth daily. Swallow whole.   Yes [provider]  ferrous sulfate 325 (65 FE) MG tablet Take 1 tablet (325 mg total) by mouth daily with breakfast. 07/28/22  Yes Adhikari, Amrit, MD  FLUoxetine (PROZAC) 20 MG capsule Take 20 mg by mouth at bedtime.    Yes [provider]  furosemide (LASIX) 40 MG tablet Take 40 mg by mouth daily.   Yes [provider]  gabapentin (NEURONTIN) 600 MG  tablet Take 600 mg by mouth 2 (two) times daily. 07/17/22  Yes [provider]  insulin lispro protamine-lispro (HUMALOG 75/25 MIX) (75-25) 100 UNIT/ML SUSP injection Inject 16 Units into the skin 2 (two) times daily with a meal.   Yes [provider]  losartan (COZAAR) 50 MG tablet Take 50 mg by mouth daily.   Yes [provider]  Melatonin 3 MG TABS Take 1 tablet by mouth at bedtime.    Yes [provider]  metFORMIN (GLUCOPHAGE-XR) 500 MG 24 hr tablet Take 500 mg by mouth 2 (two) times daily with a meal. 12/12/21  Yes [provider]  metoprolol succinate (TOPROL-XL) 100 MG 24 hr tablet Take 100 mg by mouth at bedtime.   Yes [provider]  Multiple Vitamin (MULTIVITAMIN) tablet Take 1 tablet by mouth daily.   Yes [provider]  nitroGLYCERIN (NITROSTAT) 0.4 MG SL tablet Place 1 tablet (0.4 mg total) under the tongue every 5 (five)  minutes as needed. For chest pain Patient taking differently: Place 0.4 mg under the tongue every 5 (five) minutes as needed for chest pain. For chest pain 03/28/12  Yes [name], Deloris Ping, MD  rosuvastatin (CRESTOR) 10 MG tablet Take 10 mg by mouth every morning.    Yes [provider]  tamsulosin (FLOMAX) 0.4 MG CAPS capsule Take 0.4 mg by mouth daily. 12/04/22  Yes [provider]  zolpidem (AMBIEN) 5 MG tablet Take 2.5 mg by mouth at bedtime as needed for sleep.   Yes [provider]  aspirin EC 325 MG tablet Take 1 tablet (325 mg total) by mouth daily. Patient not taking: Reported on 01/07/2023 09/22/22   Lorin Glass, MD  furosemide (LASIX) 20 MG tablet Take 1 tablet (20 mg total) by mouth daily as needed for fluid. Patient not taking: Reported on 01/07/2023 12/28/16   Hillis Range, MD    Allergies:  No Known Allergies  Social History:  reports that he quit smoking about 44 years ago. His smoking use included cigarettes. He started smoking about 67 years ago. He has a 115  pack-year smoking history. He has never used smokeless tobacco. He reports that he does not drink alcohol and does not use drugs.  Family History: Family History  Problem Relation Age of Onset   Diabetes Mother    Coronary artery disease Mother    Heart disease Mother    Stroke Father    Cancer Father        prostate   Cancer Daughter        lymphoma hodgkins    Seizures Daughter     Physical Exam: Vitals:   01/07/23 1900 01/07/23 1902 01/07/23 1917 01/07/23 2004  BP: 124/82 (!) 138/59 (!) 130/55 (!) 153/67  Pulse: 60 62 73 76  Resp: 16  16 (!) 27  Temp: 98.8 F (37.1 C)  99.3 F (37.4 C) 98.2 F (36.8 C)  TempSrc: Oral  Oral Oral  SpO2: 98% 99% 98% 97%  Weight:      Height:        General: A&O x 3, chronically ill, weak appearing gentleman, in no distress Eyes: Pale conjunctiva, no scleral icterus ENT: Moist oral mucosa, neck supple, no thyromegaly Lungs: CTA B/L, no wheeze, no crackles, no use of accessory muscles Cardiovascular: RRR, no regurgitation, no gallops, no murmurs. No carotid bruits, no JVD Abdomen: soft, positive BS, NTND, no flank tenderness, not an acute abdomen GU: not examined Neuro: CN II - XII grossly intact, sensation intact Musculoskeletal: strength 5/5 all extremities. Trace LE edema, mild resting muscular UE tremor Skin: no rash, no subcutaneous crepitation, no decubitus Psych: appropriate patient   Labs on Admission:  Recent Labs    01/07/23 1700  NA 132*  K 3.9  CL 104  CO2 19*  GLUCOSE 216*  BUN 29*  CREATININE 1.65*  CALCIUM 8.3*   Recent Labs    01/07/23 1700  AST 32  ALT 25  ALKPHOS 57  BILITOT 0.6  PROT 6.9  ALBUMIN 3.3*    Recent Labs    01/07/23 1700  WBC 13.2*  NEUTROABS 10.1*  HGB 6.4*  HCT 19.9*  MCV 92.6  PLT 248    Radiological Exams on Admission: CT Cervical Spine Wo Contrast  Result Date: 01/07/2023 CLINICAL DATA:  Poly trauma, blunt EXAM: CT CERVICAL SPINE WITHOUT CONTRAST TECHNIQUE:  Multidetector CT imaging of the cervical spine was performed without intravenous contrast. Multiplanar CT image reconstructions were also generated. RADIATION  DOSE REDUCTION: This exam was performed according to the departmental dose-optimization program which includes automated exposure control, adjustment of the mA and/or kV according to patient size and/or use of iterative reconstruction technique. COMPARISON:  Cervical spine radiographs 08/22/2005. MRI cervical spine 07/25/2005 FINDINGS: Alignment: Slight anterior subluxation of C7 on T1, unchanged since prior study. Otherwise normal alignment. Normal alignment of the posterior elements. Skull base and vertebrae: Skull base appears intact. No vertebral compression deformities. No focal bone lesion or bone destruction. Soft tissues and spinal canal: No prevertebral soft tissue swelling. No abnormal paraspinal soft tissue mass or infiltration. Disc levels: Postoperative changes with anterior plate and screw fixation from C4 through C7 with intervertebral fusion. Fusion appears intact. Degenerative changes in the other interspace levels with disc space narrowing and osteophyte formation. Prominent degenerative changes at C1-2. Degenerative changes in the posterior facet joints. Upper chest: Lung apices are clear. Other: Vascular calcifications. IMPRESSION: 1. No change in alignment since previous studies. No acute displaced fractures identified. 2. Anterior plate and screw fixation with intervertebral fusion from C4 through C7 appears intact. 3. Degenerative changes. Electronically Signed   By: Burman Nieves M.D.   On: 01/07/2023 20:12   CT Head Wo Contrast  Result Date: 01/07/2023 CLINICAL DATA:  Poly trauma, blunt EXAM: CT HEAD WITHOUT CONTRAST TECHNIQUE: Contiguous axial images were obtained from the base of the skull through the vertex without intravenous contrast. RADIATION DOSE REDUCTION: This exam was performed according to the departmental  dose-optimization program which includes automated exposure control, adjustment of the mA and/or kV according to patient size and/or use of iterative reconstruction technique. COMPARISON:  CT head 07/24/2022.  MRI brain 04/12/2016 FINDINGS: Brain: Diffuse cerebral atrophy. Ventricular dilatation consistent with central atrophy. Low-attenuation changes in the deep white matter consistent with small vessel ischemia. Small focal area of encephalomalacia in the right post frontal gyrus suggesting old infarct. No change since prior study. No abnormal extra-axial fluid collections. No mass effect or midline shift. Gray-white matter junctions are distinct. Basal cisterns are not effaced. No acute intracranial hemorrhage. Vascular: No hyperdense vessel or unexpected calcification. Skull: Normal. Negative for fracture or focal lesion. Sinuses/Orbits: No acute finding. Other: None. IMPRESSION: No acute intracranial abnormalities. Chronic atrophy and small vessel ischemic changes. Old focal infarct on the right. Electronically Signed   By: Burman Nieves M.D.   On: 01/07/2023 20:06    Assessment/Plan Present on Admission:  Acute blood loss anemia secondary to gross hematuria -1 unit packed blood cell ordered by EDP.  Posttransfusion H&H.  Will transfuse again as needed. -Serial H&H ordered every 8 hours -Aspirin held -Foley in place, draining with ease.  No obvious clots.  Low threshold to change to three-way bladder irrigation if needed with due to occlusions from clots -Urology consult placed by EDP -Urine culture collected.  IV antibiotics Rocephin initiated in ER, continued on floor   Generalized weakness //  Falls -PT consult placed   Possible cystitis -Possible cystitis, however ketone, nitrate negative.  Few bacteria -Patient self caths and has a history of radiation. -Lactic acid, urine urine culture ordered -Continue antibiotics Rocephin -Indwelling Foley placed -Flomax resumed   CAD (coronary  artery disease) //  Chronic heart failure with preserved ejection fraction (HCC) //  Essential hypertension -Aspirin on hold -Losartan and metoprolol currently on hold due to borderline blood pressures   T2DM -Sliding scale insulin ordered.  Home NovoLog mix ordered   Remote history of prostate cancer -Status post radiation therapy    H/o  CVA  -No residual deficits  Vannesa Abair 01/07/2023, 8:40 PM

## 2023-01-07 NOTE — ED Provider Notes (Signed)
EMERGENCY DEPARTMENT AT [facility] Provider Note   CSN: [id number] Arrival date & time: 01/07/23  1529     History {Add pertinent medical, surgical, social history, OB history to HPI:1} Chief Complaint  Patient presents with   Hematuria    [name] is a 87 y.o. male.  Patient has a history of prostate cancer and has had radiation.  He also has problems emptying his bladder and gets catheterizations 3 times a day.   Hematuria       Home Medications Prior to Admission medications   Medication Sig Start Date End Date Taking? Authorizing Provider  aspirin EC 81 MG tablet Take 162 mg by mouth daily. Swallow whole.   Yes [provider]  ferrous sulfate 325 (65 FE) MG tablet Take 1 tablet (325 mg total) by mouth daily with breakfast. 07/28/22  Yes Adhikari, Amrit, MD  FLUoxetine (PROZAC) 20 MG capsule Take 20 mg by mouth at bedtime.    Yes [provider]  furosemide (LASIX) 40 MG tablet Take 40 mg by mouth daily.   Yes [provider]  gabapentin (NEURONTIN) 600 MG tablet Take 600 mg by mouth 2 (two) times daily. 07/17/22  Yes [provider]  insulin lispro protamine-lispro (HUMALOG 75/25 MIX) (75-25) 100 UNIT/ML SUSP injection Inject 16 Units into the skin 2 (two) times daily with a meal.   Yes [provider]  losartan (COZAAR) 50 MG tablet Take 50 mg by mouth daily.   Yes [provider]  Melatonin 3 MG TABS Take 1 tablet by mouth at bedtime.    Yes [provider]  metFORMIN (GLUCOPHAGE-XR) 500 MG 24 hr tablet Take 500 mg by mouth 2 (two) times daily with a meal. 12/12/21  Yes [provider]  metoprolol succinate (TOPROL-XL) 100 MG 24 hr tablet Take 100 mg by mouth at bedtime.   Yes [provider]  Multiple Vitamin (MULTIVITAMIN) tablet Take 1 tablet by mouth daily.   Yes [provider]  nitroGLYCERIN (NITROSTAT) 0.4 MG SL tablet Place 1 tablet (0.4 mg  total) under the tongue every 5 (five) minutes as needed. For chest pain Patient taking differently: Place 0.4 mg under the tongue every 5 (five) minutes as needed for chest pain. For chest pain 03/28/12  Yes [name], Deloris Ping, MD  rosuvastatin (CRESTOR) 10 MG tablet Take 10 mg by mouth every morning.    Yes [provider]  tamsulosin (FLOMAX) 0.4 MG CAPS capsule Take 0.4 mg by mouth daily. 12/04/22  Yes [provider]  zolpidem (AMBIEN) 5 MG tablet Take 2.5 mg by mouth at bedtime as needed for sleep.   Yes [provider]  aspirin EC 325 MG tablet Take 1 tablet (325 mg total) by mouth daily. Patient not taking: Reported on 01/07/2023 09/22/22   Lorin Glass, MD  furosemide (LASIX) 20 MG tablet Take 1 tablet (20 mg total) by mouth daily as needed for fluid. Patient not taking: Reported on 01/07/2023 12/28/16   Hillis Range, MD      Allergies    Patient has no known allergies.    Review of Systems   Review of Systems  Genitourinary:  Positive for hematuria.    Physical Exam Updated Vital Signs BP (!) 153/67 (BP Location: Right Arm)   Pulse 76   Temp 98.2 F (36.8 C) (Oral)   Resp (!) 27   Ht 5\' 6"  (1.676 m)   Wt 65.8 kg   SpO2 97%  BMI 23.40 kg/m  Physical Exam  ED Results / Procedures / Treatments   Labs (all labs ordered are listed, but only abnormal results are displayed) Labs Reviewed  COMPREHENSIVE METABOLIC PANEL - Abnormal; Notable for the following components:      Result Value   Sodium 132 (*)    CO2 19 (*)    Glucose, Bld 216 (*)    BUN 29 (*)    Creatinine, Ser 1.65 (*)    Calcium 8.3 (*)    Albumin 3.3 (*)    GFR, Estimated 41 (*)    All other components within normal limits  CBC WITH DIFFERENTIAL/PLATELET - Abnormal; Notable for the following components:   WBC 13.2 (*)    RBC 2.15 (*)    Hemoglobin 6.4 (*)    HCT 19.9 (*)    Neutro Abs 10.1 (*)    Monocytes Absolute 1.7 (*)    Abs Immature Granulocytes 0.15 (*)    All other  components within normal limits  PROTIME-INR - Abnormal; Notable for the following components:   Prothrombin Time 15.4 (*)    All other components within normal limits  URINALYSIS, ROUTINE W REFLEX MICROSCOPIC - Abnormal; Notable for the following components:   Color, Urine BROWN (*)    APPearance CLOUDY (*)    Glucose, UA 50 (*)    Hgb urine dipstick LARGE (*)    Protein, ur 100 (*)    Leukocytes,Ua SMALL (*)    Bacteria, UA FEW (*)    All other components within normal limits  URINE CULTURE  CULTURE, BLOOD (ROUTINE X 2)  CULTURE, BLOOD (ROUTINE X 2)  VITAMIN B12  FOLATE  IRON AND TIBC  FERRITIN  RETICULOCYTES  HEMOGLOBIN AND HEMATOCRIT, BLOOD  TYPE AND SCREEN  PREPARE RBC (CROSSMATCH)    EKG None  Radiology CT Cervical Spine Wo Contrast  Result Date: 01/07/2023 CLINICAL DATA:  Poly trauma, blunt EXAM: CT CERVICAL SPINE WITHOUT CONTRAST TECHNIQUE: Multidetector CT imaging of the cervical spine was performed without intravenous contrast. Multiplanar CT image reconstructions were also generated. RADIATION DOSE REDUCTION: This exam was performed according to the departmental dose-optimization program which includes automated exposure control, adjustment of the mA and/or kV according to patient size and/or use of iterative reconstruction technique. COMPARISON:  Cervical spine radiographs 08/22/2005. MRI cervical spine 07/25/2005 FINDINGS: Alignment: Slight anterior subluxation of C7 on T1, unchanged since prior study. Otherwise normal alignment. Normal alignment of the posterior elements. Skull base and vertebrae: Skull base appears intact. No vertebral compression deformities. No focal bone lesion or bone destruction. Soft tissues and spinal canal: No prevertebral soft tissue swelling. No abnormal paraspinal soft tissue mass or infiltration. Disc levels: Postoperative changes with anterior plate and screw fixation from C4 through C7 with intervertebral fusion. Fusion appears intact.  Degenerative changes in the other interspace levels with disc space narrowing and osteophyte formation. Prominent degenerative changes at C1-2. Degenerative changes in the posterior facet joints. Upper chest: Lung apices are clear. Other: Vascular calcifications. IMPRESSION: 1. No change in alignment since previous studies. No acute displaced fractures identified. 2. Anterior plate and screw fixation with intervertebral fusion from C4 through C7 appears intact. 3. Degenerative changes. Electronically Signed   By: Burman Nieves M.D.   On: 01/07/2023 20:12   CT Head Wo Contrast  Result Date: 01/07/2023 CLINICAL DATA:  Poly trauma, blunt EXAM: CT HEAD WITHOUT CONTRAST TECHNIQUE: Contiguous axial images were obtained from the base of the skull through the vertex without intravenous contrast. RADIATION DOSE  REDUCTION: This exam was performed according to the departmental dose-optimization program which includes automated exposure control, adjustment of the mA and/or kV according to patient size and/or use of iterative reconstruction technique. COMPARISON:  CT head 07/24/2022.  MRI brain 04/12/2016 FINDINGS: Brain: Diffuse cerebral atrophy. Ventricular dilatation consistent with central atrophy. Low-attenuation changes in the deep white matter consistent with small vessel ischemia. Small focal area of encephalomalacia in the right post frontal gyrus suggesting old infarct. No change since prior study. No abnormal extra-axial fluid collections. No mass effect or midline shift. Gray-white matter junctions are distinct. Basal cisterns are not effaced. No acute intracranial hemorrhage. Vascular: No hyperdense vessel or unexpected calcification. Skull: Normal. Negative for fracture or focal lesion. Sinuses/Orbits: No acute finding. Other: None. IMPRESSION: No acute intracranial abnormalities. Chronic atrophy and small vessel ischemic changes. Old focal infarct on the right. Electronically Signed   By: Burman Nieves  M.D.   On: 01/07/2023 20:06    Procedures Procedures  {Document cardiac monitor, telemetry assessment procedure when appropriate:1}  Medications Ordered in ED Medications  0.9 %  sodium chloride infusion (Manually program via Guardrails IV Fluids) (has no administration in time range)  insulin aspart (novoLOG) injection 0-15 Units (has no administration in time range)  insulin aspart (novoLOG) injection 0-5 Units (has no administration in time range)  sodium chloride 0.9 % bolus 1,000 mL (1,000 mLs Intravenous New Bag/Given 01/07/23 1828)  cefTRIAXone (ROCEPHIN) 2 g in sodium chloride 0.9 % 100 mL IVPB (0 g Intravenous Stopped 01/07/23 1936)    ED Course/ Medical Decision Making/ A&P   {   Click here for ABCD2, HEART and other calculatorsREFRESH Note before signing :1}                              Medical Decision Making Amount and/or Complexity of Data Reviewed Labs: ordered.  Risk Prescription drug management. Decision regarding hospitalization.   Patient has anemia and will get transfused 2 units of packed red blood cells.  Patient also has a hemorrhagic cystitis.  And is being mated to medicine with urology consulting tomorrow  {Document critical care time when appropriate:1} {Document review of labs and clinical decision tools ie heart score, Chads2Vasc2 etc:1}  {Document your independent review of radiology images, and any outside records:1} {Document your discussion with family members, caretakers, and with consultants:1} {Document social determinants of health affecting pt's care:1} {Document your decision making why or why not admission, treatments were needed:1} Final Clinical Impression(s) / ED Diagnoses Final diagnoses:  Iron deficiency anemia due to chronic blood loss  Hemorrhagic cystitis    Rx / DC Orders ED Discharge Orders     None

## 2023-01-08 DIAGNOSIS — E119 Type 2 diabetes mellitus without complications: Secondary | ICD-10-CM | POA: Diagnosis not present

## 2023-01-08 DIAGNOSIS — N179 Acute kidney failure, unspecified: Secondary | ICD-10-CM

## 2023-01-08 DIAGNOSIS — N3040 Irradiation cystitis without hematuria: Secondary | ICD-10-CM | POA: Diagnosis not present

## 2023-01-08 DIAGNOSIS — I10 Essential (primary) hypertension: Secondary | ICD-10-CM | POA: Diagnosis not present

## 2023-01-08 LAB — HEMOGLOBIN AND HEMATOCRIT, BLOOD
HCT: 25.7 % — ABNORMAL LOW (ref 39.0–52.0)
Hemoglobin: 8.5 g/dL — ABNORMAL LOW (ref 13.0–17.0)

## 2023-01-08 LAB — GLUCOSE, CAPILLARY
Glucose-Capillary: 172 mg/dL — ABNORMAL HIGH (ref 70–99)
Glucose-Capillary: 157 mg/dL — ABNORMAL HIGH (ref 70–99)
Glucose-Capillary: 196 mg/dL — ABNORMAL HIGH (ref 70–99)
Glucose-Capillary: 167 mg/dL — ABNORMAL HIGH (ref 70–99)

## 2023-01-08 LAB — COMPREHENSIVE METABOLIC PANEL
ALT: 29 U/L (ref 0–44)
AST: 45 U/L — ABNORMAL HIGH (ref 15–41)
Albumin: 2.9 g/dL — ABNORMAL LOW (ref 3.5–5.0)
Alkaline Phosphatase: 65 U/L (ref 38–126)
Anion gap: 9 (ref 5–15)
BUN: 26 mg/dL — ABNORMAL HIGH (ref 8–23)
CO2: 19 mmol/L — ABNORMAL LOW (ref 22–32)
Calcium: 8 mg/dL — ABNORMAL LOW (ref 8.9–10.3)
Chloride: 108 mmol/L (ref 98–111)
Creatinine, Ser: 1.65 mg/dL — ABNORMAL HIGH (ref 0.61–1.24)
GFR, Estimated: 41 mL/min — ABNORMAL LOW (ref >60)
Glucose, Bld: 196 mg/dL — ABNORMAL HIGH (ref 70–99)
Potassium: 3.9 mmol/L (ref 3.5–5.1)
Sodium: 136 mmol/L (ref 135–145)
Total Bilirubin: 0.6 mg/dL (ref 0.3–1.2)
Total Protein: 5.8 g/dL — ABNORMAL LOW (ref 6.5–8.1)

## 2023-01-08 LAB — MAGNESIUM: Magnesium: 1.8 mg/dL (ref 1.7–2.4)

## 2023-01-08 LAB — FERRITIN: Ferritin: 51 ng/mL (ref 24–336)

## 2023-01-08 LAB — FOLATE: Folate: 18.2 ng/mL (ref >5.9)

## 2023-01-08 LAB — IRON AND TIBC
Iron: 43 ug/dL — ABNORMAL LOW (ref 45–182)
Saturation Ratios: 15 % — ABNORMAL LOW (ref 17.9–39.5)
TIBC: 281 ug/dL (ref 250–450)
UIBC: 238 ug/dL

## 2023-01-08 LAB — VITAMIN B12: Vitamin B-12: 253 pg/mL (ref 180–914)

## 2023-01-08 MED ORDER — SIMETHICONE 80 MG PO CHEW
80.0000 mg | CHEWABLE_TABLET | Freq: Once | ORAL | Status: AC
  Administered 2023-01-08: 80 mg via ORAL
  Filled 2023-01-08: qty 1

## 2023-01-08 MED ORDER — HYOSCYAMINE SULFATE 0.125 MG SL SUBL
0.1250 mg | SUBLINGUAL_TABLET | SUBLINGUAL | Status: DC | PRN
  Administered 2023-01-08 – 2023-01-09 (×5): 0.125 mg via SUBLINGUAL
  Filled 2023-01-08 (×9): qty 1

## 2023-01-08 MED ORDER — SODIUM CHLORIDE 0.9 % IR SOLN
3000.0000 mL | Status: DC
  Administered 2023-01-08 – 2023-01-09 (×10): 3000 mL

## 2023-01-08 MED ORDER — TRANEXAMIC ACID-NACL 1000-0.7 MG/100ML-% IV SOLN
1000.0000 mg | Freq: Once | INTRAVENOUS | Status: AC
  Administered 2023-01-08: 1000 mg via INTRAVENOUS
  Filled 2023-01-08: qty 100

## 2023-01-08 MED ORDER — FINASTERIDE 5 MG PO TABS
5.0000 mg | ORAL_TABLET | Freq: Every day | ORAL | Status: DC
  Administered 2023-01-08 – 2023-01-11 (×3): 5 mg via ORAL
  Filled 2023-01-08 (×3): qty 1

## 2023-01-08 NOTE — Assessment & Plan Note (Signed)
01-08-2023 remains on IV Rocephin 2 g daily. Awaiting urine culture 01-09-2023 urine cx growing E. Coli and Klebsiella. Awaiting final sensitivities. Continue with IV Rocephin for now. Blood cx negative thus far. 01-10-2023 final sensitivities known. Finish out IV rocephin today and change to po [employer]. Will discharge on 5 additional days of Duricef.

## 2023-01-08 NOTE — Progress Notes (Signed)
PROGRESS NOTE    [name]  UJW:[id number] DOB: Dec 18, 1935 DOA: 01/07/2023 PCP: Renford Dills, MD  Subjective: Pt seen and examined. Continues with CBI. NPO tomorrow in case urology wants to perform cystoscopy.   Hospital Course: HPI: This is a 87 year old male with past medical history of DM2, HTN, HLD, CHF, CAD/CABG/stent, carotid artery disease s/p CEA, bioprosthetic mitral valve, A-flutter on aspirin only,, stroke, GERD, esophageal stricture, depression, chronic anemia, OSA, spinal stenosis, nephrolithiasis, prostate cancer s/p radiation and TURP 2017.  Patient self caths 3 times daily.  He presents with complaints of gross hematuria ongoing for approximately 2 weeks.  He states he has bright red blood as well as lots of clots.  He denies fever but endorses chills.  He denies nausea or vomiting but endorses mild right-sided flank discomfort every now and then.  He states he been getting weaker and has fallen twice Tuesday and Thursday.  He did hit his head but denies neck pain.  He states he is not lightheaded or dizzy just weak.  He denies chest pains.  He lives at home with his wife and uses a walker at baseline.     In the ER patient is hemodynamically stable.  Foley placed has return of gross hematuria.  Hemoglobin 6.4.  Platelets normal.  INR 1.2.  UA hemoglobin larger, leukocyte small ketones negative, bacteria few.  Urine culture, blood cultures ordered blood transfusion ordered, anemia panel ordered.  Urology consult placed by epic by EDP. Significant Events: Admitted 01/07/2023   Significant Labs: Admitting HgB 6.4, BUN 29, Scr 1.65  Significant Imaging Studies:   Antibiotic Therapy: Anti-infectives (From admission, onward)    Start     Dose/Rate Route Frequency Ordered Stop   01/08/23 1000  cefTRIAXone (ROCEPHIN) 2 g in sodium chloride 0.9 % 100 mL IVPB        2 g 200 mL/hr over 30 Minutes Intravenous Every 24 hours 01/07/23 2044     01/07/23 1730  cefTRIAXone  (ROCEPHIN) 2 g in sodium chloride 0.9 % 100 mL IVPB        2 g 200 mL/hr over 30 Minutes Intravenous  Once 01/07/23 1726 01/07/23 1936       Procedures: 01-07-2023 - insertion 3 way CBI catheter 12-28-2022 transfusion 2 units PRBC  Consultants: Urology - [facility]    Assessment and Plan: * Acute blood loss anemia (ABLA) Admitted with HgB of 6.4. transfused with 2 units PRBC on admission.  01-08-2023 HgB up to 8.5. due to gross hematuria from presumed radiation cystitis  AKI (acute kidney injury) (HCC) Cause of AKI is multifactorial. Can include radiation cystitis, gross hematuria, Acute blood loss and UTI.  Continue to monitor Scr. Holding ARB and lasix for now.  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 Urology is following.  Acute cystitis with hematuria 01-08-2023 remains on IV Rocephin 2 g daily. Awaiting urine culture  Gross hematuria 01-08-2023 Presumed to be from radiation cystitis. Urology may perform cystoscopy tomorrow.   Chronic heart failure with preserved ejection fraction (HCC) Stable. Holding lasix and ARB due to AKI.  Hyperlipidemia Continue crestor 10 mg daily.  DM2 (diabetes mellitus, type 2) (HCC) Continue with SSI. And 70/30 mix insulin.  Essential hypertension Stable. Holding lasix and ARB due to AKI  CAD (coronary artery disease) Stable. Hold ASA due to gross hematuria.       DVT prophylaxis: SCDs Start: 01/07/23 2046     Code Status: Full Code Family Communication: no  family at bedside Disposition Plan: return hom Reason for continuing need for hospitalization: continues on CBI. May need cystoscopy tomorrow by urology. Remains on IV abx  Objective: Vitals:   01/08/23 0009 01/08/23 0252 01/08/23 0436 01/08/23 0824  BP: (!) 108/56 (!) 121/59 (!) 125/58 (!) 142/68  Pulse: 71 (!) 58 65 68  Resp: 16  14 18   Temp: 99.6 F (37.6 C) 98.3 F (36.8 C) 98 F (36.7 C) 98.8 F (37.1 C)   TempSrc: Oral Oral Oral Oral  SpO2: 100% 94% 100% 96%  Weight:      Height:        Intake/Output Summary (Last 24 hours) at 01/08/2023 0919 Last data filed at 01/08/2023 0900 Gross per 24 hour  Intake 10698.25 ml  Output 29562 ml  Net -12001.75 ml   Filed Weights   01/07/23 1546 01/07/23 1553 01/07/23 2221  Weight: 68 kg 65.8 kg 76.5 kg    Examination:  Physical Exam Vitals and nursing note reviewed.  Constitutional:      General: He is not in acute distress.    Appearance: He is not ill-appearing, toxic-appearing or diaphoretic.  HENT:     Head: Normocephalic and atraumatic.     Nose: Nose normal.  Eyes:     General: No scleral icterus. Cardiovascular:     Rate and Rhythm: Normal rate and regular rhythm.  Pulmonary:     Effort: Pulmonary effort is normal.     Breath sounds: Normal breath sounds.  Abdominal:     General: Bowel sounds are normal. There is no distension.  Genitourinary:    Comments: Foley catheter with bloody urine Skin:    General: Skin is warm and dry.     Capillary Refill: Capillary refill takes less than 2 seconds.  Neurological:     General: No focal deficit present.     Mental Status: He is alert and oriented to person, place, and time.     Data Reviewed: I have personally reviewed following labs and imaging studies  CBC: Recent Labs  Lab 01/07/23 1700 01/07/23 2320 01/08/23 0546  WBC 13.2*  --   --   NEUTROABS 10.1*  --   --   HGB 6.4* 7.0* 8.5*  HCT 19.9* 21.9* 25.7*  MCV 92.6  --   --   PLT 248  --   --    Basic Metabolic Panel: Recent Labs  Lab 01/07/23 1700 01/07/23 2320  NA 132* 136  K 3.9 3.9  CL 104 108  CO2 19* 19*  GLUCOSE 216* 196*  BUN 29* 26*  CREATININE 1.65* 1.65*  CALCIUM 8.3* 8.0*  MG  --  1.8   GFR: Estimated Creatinine Clearance: 30.1 mL/min (A) (by C-G formula based on SCr of 1.65 mg/dL (H)). Liver Function Tests: Recent Labs  Lab 01/07/23 1700 01/07/23 2320  AST 32 45*  ALT 25 29   ALKPHOS 57 65  BILITOT 0.6 0.6  PROT 6.9 5.8*  ALBUMIN 3.3* 2.9*   Coagulation Profile: Recent Labs  Lab 01/07/23 1700 01/07/23 2320  INR 1.2 1.3*   BNP (last 3 results) Recent Labs    07/24/22 2016  BNP 282.6*   CBG: Recent Labs  Lab 01/07/23 2220 01/08/23 0722  GLUCAP 184* 172*   Anemia Panel: Recent Labs    01/07/23 2320  VITAMINB12 253  FOLATE 18.2  FERRITIN 51  TIBC 281  IRON 43*  RETICCTPCT 2.5    Recent Results (from the past 240 hour(s))  SARS Coronavirus 2  by RT PCR (hospital order, performed in [facility] hospital lab) *cepheid single result test* Anterior Nasal Swab     Status: None   Collection Time: 01/07/23  9:42 PM   Specimen: Anterior Nasal Swab  Result Value Ref Range Status   SARS Coronavirus 2 by RT PCR NEGATIVE NEGATIVE Final    Comment: (NOTE) SARS-CoV-2 target nucleic acids are NOT DETECTED.  The SARS-CoV-2 RNA is generally detectable in upper and lower respiratory specimens during the acute phase of infection. The lowest concentration of SARS-CoV-2 viral copies this assay can detect is 250 copies / mL. A negative result does not preclude SARS-CoV-2 infection and should not be used as the sole basis for treatment or other patient management decisions.  A negative result may occur with improper specimen collection / handling, submission of specimen other than nasopharyngeal swab, presence of viral mutation(s) within the areas targeted by this assay, and inadequate number of viral copies (<250 copies / mL). A negative result must be combined with clinical observations, patient history, and epidemiological information.  Fact Sheet for Patients:   RoadLapTop.co.za  Fact Sheet for Healthcare Providers: http://kim-miller.com/  This test is not yet approved or  cleared by the [place] FDA and has been authorized for detection and/or diagnosis of SARS-CoV-2 by FDA under an Emergency Use  Authorization (EUA).  This EUA will remain in effect (meaning this test can be used) for the duration of the COVID-19 declaration under Section 564(b)(1) of the Act, 21 U.S.C. section 360bbb-3(b)(1), unless the authorization is terminated or revoked sooner.  Performed at [facility], [address], [place], [place] [address]      Radiology Studies: CT ABDOMEN PELVIS WO CONTRAST  Result Date: 01/07/2023 CLINICAL DATA:  Acute nonlocalized abdominal pain EXAM: CT ABDOMEN AND PELVIS WITHOUT CONTRAST TECHNIQUE: Multidetector CT imaging of the abdomen and pelvis was performed following the standard protocol without IV contrast. RADIATION DOSE REDUCTION: This exam was performed according to the departmental dose-optimization program which includes automated exposure control, adjustment of the mA and/or kV according to patient size and/or use of iterative reconstruction technique. COMPARISON:  08/18/2022 FINDINGS: Lower chest: No acute abnormality. Mild cardiomegaly. Small hiatal hernia. Hepatobiliary: No focal liver abnormality is seen. No gallstones, gallbladder wall thickening, or biliary dilatation. Pancreas: Unremarkable Spleen: Unremarkable Adrenals/Urinary Tract: The adrenal glands are unremarkable. The kidneys are normal in size and position. Mild-to-moderate bilateral perinephric stranding is present, nonspecific, but progressive since prior examination suggesting un underlying inflammatory process involving the kidneys. No hydronephrosis. Circumferential thickening of the bladder wall is again seen suggesting changes of chronic bladder outlet obstruction. Interval placement of a Foley catheter with its retaining balloon seen within the bladder lumen. Small amount of high density material is seen dependently within the bladder lumen surrounding the Foley catheter balloon likely representing a small blood clot. Stomach/Bowel: Stomach, small bowel, and large bowel are unremarkable.  Circumferential thickening of the rectum is again noted, not well characterized on this examination but similar to that noted on prior exam. Mild presacral and perirectal edema is again noted and, together, the findings suggest changes of a chronic infectious or inflammatory proctitis. No free intraperitoneal gas or fluid. Vascular/Lymphatic: Aortic atherosclerosis. No enlarged abdominal or pelvic lymph nodes. Reproductive: Brachytherapy seeds are seen posterior to the prostate gland. The prostate gland is atrophic, particular when compared to remote prior examination of 03/03/2015. Other: No abdominal wall hernia Musculoskeletal: Left L4 hemilaminectomy and L4-5 anterior lumbar fusion with instrumentation with solid incorporation  of interbody bone graft. Advanced degenerative changes are seen within the lumbar spine. No acute bone abnormality. No lytic or blastic bone lesion. IMPRESSION: 1. Interval placement of a Foley catheter with its retaining balloon seen within the bladder lumen. Small amount of high density material is seen dependently within the bladder lumen surrounding the Foley catheter balloon likely representing a small blood clot. 2. Progressive mild peri-renal stranding, nonspecific, but suggesting an underlying inflammatory process involving the kidneys bilaterally. Correlation with renal function tests and possibly urinalysis and urine culture may be helpful. 3. Stable circumferential thickening of the rectum with mild presacral and perirectal edema suggesting changes of a chronic infectious or inflammatory proctitis. 4. Mild cardiomegaly. Aortic Atherosclerosis (ICD10-I70.0). Electronically Signed   By: Helyn Numbers M.D.   On: 01/07/2023 22:01   CT Cervical Spine Wo Contrast  Result Date: 01/07/2023 CLINICAL DATA:  Poly trauma, blunt EXAM: CT CERVICAL SPINE WITHOUT CONTRAST TECHNIQUE: Multidetector CT imaging of the cervical spine was performed without intravenous contrast. Multiplanar CT  image reconstructions were also generated. RADIATION DOSE REDUCTION: This exam was performed according to the departmental dose-optimization program which includes automated exposure control, adjustment of the mA and/or kV according to patient size and/or use of iterative reconstruction technique. COMPARISON:  Cervical spine radiographs 08/22/2005. MRI cervical spine 07/25/2005 FINDINGS: Alignment: Slight anterior subluxation of C7 on T1, unchanged since prior study. Otherwise normal alignment. Normal alignment of the posterior elements. Skull base and vertebrae: Skull base appears intact. No vertebral compression deformities. No focal bone lesion or bone destruction. Soft tissues and spinal canal: No prevertebral soft tissue swelling. No abnormal paraspinal soft tissue mass or infiltration. Disc levels: Postoperative changes with anterior plate and screw fixation from C4 through C7 with intervertebral fusion. Fusion appears intact. Degenerative changes in the other interspace levels with disc space narrowing and osteophyte formation. Prominent degenerative changes at C1-2. Degenerative changes in the posterior facet joints. Upper chest: Lung apices are clear. Other: Vascular calcifications. IMPRESSION: 1. No change in alignment since previous studies. No acute displaced fractures identified. 2. Anterior plate and screw fixation with intervertebral fusion from C4 through C7 appears intact. 3. Degenerative changes. Electronically Signed   By: Burman Nieves M.D.   On: 01/07/2023 20:12   CT Head Wo Contrast  Result Date: 01/07/2023 CLINICAL DATA:  Poly trauma, blunt EXAM: CT HEAD WITHOUT CONTRAST TECHNIQUE: Contiguous axial images were obtained from the base of the skull through the vertex without intravenous contrast. RADIATION DOSE REDUCTION: This exam was performed according to the departmental dose-optimization program which includes automated exposure control, adjustment of the mA and/or kV according to  patient size and/or use of iterative reconstruction technique. COMPARISON:  CT head 07/24/2022.  MRI brain 04/12/2016 FINDINGS: Brain: Diffuse cerebral atrophy. Ventricular dilatation consistent with central atrophy. Low-attenuation changes in the deep white matter consistent with small vessel ischemia. Small focal area of encephalomalacia in the right post frontal gyrus suggesting old infarct. No change since prior study. No abnormal extra-axial fluid collections. No mass effect or midline shift. Gray-white matter junctions are distinct. Basal cisterns are not effaced. No acute intracranial hemorrhage. Vascular: No hyperdense vessel or unexpected calcification. Skull: Normal. Negative for fracture or focal lesion. Sinuses/Orbits: No acute finding. Other: None. IMPRESSION: No acute intracranial abnormalities. Chronic atrophy and small vessel ischemic changes. Old focal infarct on the right. Electronically Signed   By: Burman Nieves M.D.   On: 01/07/2023 20:06    Scheduled Meds:  FLUoxetine  20 mg Oral QHS  insulin aspart  0-15 Units Subcutaneous TID WC   insulin aspart  0-5 Units Subcutaneous QHS   insulin aspart protamine- aspart  14 Units Subcutaneous BID WC   melatonin  5 mg Oral QHS   rosuvastatin  10 mg Oral BH-[id number]   tamsulosin  0.4 mg Oral Daily   Continuous Infusions:  cefTRIAXone (ROCEPHIN)  IV     sodium chloride irrigation       LOS: 1 day   Time spent: 35 minutes  Carollee Herter, DO  [facility]  01/08/2023, 9:19 AM

## 2023-01-08 NOTE — Subjective & Objective (Addendum)
Pt seen and examined. Continues with CBI. NPO tomorrow in case urology wants to perform cystoscopy.

## 2023-01-08 NOTE — Assessment & Plan Note (Signed)
Cause of AKI is multifactorial. Can include radiation cystitis, gross hematuria, Acute blood loss and UTI.  Continue to monitor Scr. Holding ARB and lasix for now. 01-09-2023. Scr down to 1.24 today. Was 1.65 yesterday. AKI now resolved. 01-11-2023  Scr 1.12 today. Resolved.

## 2023-01-08 NOTE — Assessment & Plan Note (Signed)
Continue with SSI. And 70/30 mix insulin. Due to stress from hematuria and UTI.

## 2023-01-08 NOTE — Progress Notes (Signed)
During CBi the patient verbalized pain and spasm in the bladder. Per order I hand irrigated the patient, extracting some small clots patient tolerated well. Will continue with plan of care and notify MD if necessary.

## 2023-01-08 NOTE — Hospital Course (Signed)
HPI: This is a 87 year old male with past medical history of DM2, HTN, HLD, CHF, CAD/CABG/stent, carotid artery disease s/p CEA, bioprosthetic mitral valve, A-flutter on aspirin only,, stroke, GERD, esophageal stricture, depression, chronic anemia, OSA, spinal stenosis, nephrolithiasis, prostate cancer s/p radiation and TURP 2017.  Patient self caths 3 times daily.  He presents with complaints of gross hematuria ongoing for approximately 2 weeks.  He states he has bright red blood as well as lots of clots.  He denies fever but endorses chills.  He denies nausea or vomiting but endorses mild right-sided flank discomfort every now and then.  He states he been getting weaker and has fallen twice Tuesday and Thursday.  He did hit his head but denies neck pain.  He states he is not lightheaded or dizzy just weak.  He denies chest pains.  He lives at home with his wife and uses a walker at baseline.     In the ER patient is hemodynamically stable.  Foley placed has return of gross hematuria.  Hemoglobin 6.4.  Platelets normal.  INR 1.2.  UA hemoglobin larger, leukocyte small ketones negative, bacteria few.  Urine culture, blood cultures ordered blood transfusion ordered, anemia panel ordered.  Urology consult placed by epic by EDP. Significant Events: Admitted 01/07/2023   Significant Labs: Admitting HgB 6.4, BUN 29, Scr 1.65  Significant Imaging Studies:   Antibiotic Therapy: Anti-infectives (From admission, onward)    Start     Dose/Rate Route Frequency Ordered Stop   01/08/23 1000  cefTRIAXone (ROCEPHIN) 2 g in sodium chloride 0.9 % 100 mL IVPB        2 g 200 mL/hr over 30 Minutes Intravenous Every 24 hours 01/07/23 2044     01/07/23 1730  cefTRIAXone (ROCEPHIN) 2 g in sodium chloride 0.9 % 100 mL IVPB        2 g 200 mL/hr over 30 Minutes Intravenous  Once 01/07/23 1726 01/07/23 1936       Procedures: 01-07-2023 - insertion 3 way CBI catheter 12-28-2022 transfusion 2 units  PRBC  Consultants: Urology - Arita Miss

## 2023-01-08 NOTE — Evaluation (Signed)
Physical Therapy Evaluation Patient Details Name: [name] MRN: [id number] DOB: Nov 20, 1935 Today's Date: 01/08/2023  History of Present Illness  Patient is a 87 year old male who presented  generalized weakness and fall with complaints of gross hematuria ongoing for approximately 2 weeks. Patient was admitted with acute blood loss anemia. PMH including but not limited to:  DM2, HTN, HLD, CHF, CAD/CABG/stent, carotid artery disease s/p CEA, bioprosthetic mitral valve, A-flutter on aspirin only, stroke, GERD, esophageal stricture, depression, chronic anemia, OSA, spinal stenosis, nephrolithiasis, prostate cancer s/p radiation and TURP 2017L shoulder surgery, prostate cancer, cervical disc surgery.   Clinical Impression  Pt is an 87 y.o. male with above HPI  resulting in the deficits listed below (see PT Problem List). Pt is typically modified independent with mobility ADLs at home (use of RW at baseline). Pt performed sit to stand transfers with contact guard for safety. Pt ambulated total of ~18ft with x1 standing rest break and contact guard with no LOB observed. Pt will have assist from his wife and family members upon d/c. Pt will benefit from continued skilled PT to maximize functional mobility and increase independence.          If plan is discharge home, recommend the following: A little help with walking and/or transfers;A little help with bathing/dressing/bathroom;Help with stairs or ramp for entrance;Assist for transportation;Assistance with cooking/housework   Can travel by private vehicle        Equipment Recommendations None recommended by PT  Recommendations for Other Services  OT consult    Functional Status Assessment Patient has had a recent decline in their functional status and demonstrates the ability to make significant improvements in function in a reasonable and predictable amount of time.     Precautions / Restrictions Precautions Precautions:  Fall Restrictions Weight Bearing Restrictions: No      Mobility  Bed Mobility               General bed mobility comments: Pt sitting EOB pre/post session    Transfers Overall transfer level: Needs assistance Equipment used: Rolling walker (2 wheels) Transfers: Sit to/from Stand Sit to Stand: Contact guard assist                Ambulation/Gait Ambulation/Gait assistance: Contact guard assist Gait Distance (Feet): 110 Feet Assistive device: Rolling walker (2 wheels) Gait Pattern/deviations: Step-through pattern, Decreased stride length, Trunk flexed, Narrow base of support Gait velocity: decreased     General Gait Details: forward head. Cues for proximity to RW and proper body positioning. NO overt LOB observed.  Stairs            Wheelchair Mobility     Tilt Bed    Modified Rankin (Stroke Patients Only)       Balance Overall balance assessment: Mild deficits observed, not formally tested                                           Pertinent Vitals/Pain Pain Assessment Pain Assessment: No/denies pain    Home Living Family/patient expects to be discharged to:: Private residence Living Arrangements: Spouse/significant other Available Help at Discharge: Family Type of Home: House Home Access: Stairs to enter Entrance Stairs-Rails: Doctor, general practice of Steps: 4   Home Layout: Multi-level;Able to live on main level with bedroom/bathroom Home Equipment: Rolling Walker (2 wheels);Cane - single point;Grab bars - tub/shower  Prior Function Prior Level of Function : Independent/Modified Independent;Driving             Mobility Comments: Endorses fall on tuesday and Thursday- wife assisted pt up ADLs Comments: IND with all ADLs, self care tasks, IADLs, driving     Extremity/Trunk Assessment   Upper Extremity Assessment Upper Extremity Assessment: Overall WFL for tasks assessed    Lower Extremity  Assessment Lower Extremity Assessment: Generalized weakness    Cervical / Trunk Assessment Cervical / Trunk Assessment: Normal  Communication   Communication Communication: No apparent difficulties  Cognition Arousal: Alert Behavior During Therapy: WFL for tasks assessed/performed Overall Cognitive Status: Within Functional Limits for tasks assessed                                          General Comments      Exercises     Assessment/Plan    PT Assessment Patient needs continued PT services  PT Problem List Decreased strength;Decreased activity tolerance;Decreased balance;Decreased mobility       PT Treatment Interventions DME instruction;Gait training;Stair training;Functional mobility training;Therapeutic activities;Therapeutic exercise;Balance training;Patient/family education    PT Goals (Current goals can be found in the Care Plan section)  Acute Rehab PT Goals Patient Stated Goal: Feel stronger and maintain independence PT Goal Formulation: With patient/family Time For Goal Achievement: 01/22/23 Potential to Achieve Goals: Good    Frequency Min 1X/week     Co-evaluation               AM-PAC PT "6 Clicks" Mobility  Outcome Measure Help needed turning from your back to your side while in a flat bed without using bedrails?: A Little Help needed moving from lying on your back to sitting on the side of a flat bed without using bedrails?: A Lot Help needed moving to and from a bed to a chair (including a wheelchair)?: A Little Help needed standing up from a chair using your arms (e.g., wheelchair or bedside chair)?: A Little Help needed to walk in hospital room?: A Little Help needed climbing 3-5 steps with a railing? : A Lot 6 Click Score: 16    End of Session Equipment Utilized During Treatment: Gait belt Activity Tolerance: Patient tolerated treatment well Patient left: in bed;with call bell/phone within reach;with nursing/sitter in  room;with family/visitor present Nurse Communication: Mobility status PT Visit Diagnosis: Unsteadiness on feet (R26.81);Muscle weakness (generalized) (M62.81)    Time: 1610-9604 PT Time Calculation (min) (ACUTE ONLY): 19 min   Charges:   PT Evaluation $PT Eval Low Complexity: 1 Low PT Treatments $Therapeutic Activity: 8-22 mins PT General Charges $$ ACUTE PT VISIT: 1 Visit         Lyman Speller PT, DPT  Acute Rehabilitation Services  Office [phone]

## 2023-01-08 NOTE — Assessment & Plan Note (Signed)
Stable. Holding lasix and ARB due to AKI.

## 2023-01-08 NOTE — Assessment & Plan Note (Signed)
radiation cystitis from prior prostate cancer treatment. Urology is following.

## 2023-01-08 NOTE — Plan of Care (Signed)
  Problem: Coping: Goal: Ability to adjust to condition or change in health will improve Outcome: Progressing   

## 2023-01-08 NOTE — Assessment & Plan Note (Signed)
Continue crestor '10mg'$  daily

## 2023-01-08 NOTE — Assessment & Plan Note (Signed)
Stable. Hold ASA due to gross hematuria.

## 2023-01-08 NOTE — Progress Notes (Signed)
Subjective: No acute events overnight.  CBI is pink lemonade in color with no clot material present in tubing running at fast gtt.   Objective: Vital signs in last 24 hours: Temp:  [98 F (36.7 C)-102.9 F (39.4 C)] 98.8 F (37.1 C) (10/21 0824) Pulse Rate:  [58-89] 68 (10/21 0824) Resp:  [14-27] 18 (10/21 0824) BP: (106-160)/(51-89) 142/68 (10/21 0824) SpO2:  [93 %-100 %] 96 % (10/21 0824) Weight:  [65.8 kg-76.5 kg] 76.5 kg (10/20 2221)  Assessment/Plan: 87 year old male with Hx of prostate cancer-s/p XRT and ADT in 2017, radiation cystitis, TURP, CIC 3 times daily, now with hematuria.  #Radiation cystitis vs. rebleed at prostatic fossa #Hematuria  Trend H&H.  2u PRBC 10/20. HgB 6.4-->7.0-->8.5  Continue three-way Foley catheter and CBI for the time being.  Will administer TXA x 2 today and recheck midday.  Cystoscopy with fulguration tomorrow around 5p with Dr. Marlou Porch.  N.p.o. at midnight.    Intake/Output from previous day: 10/20 0701 - 10/21 0700 In: 10578.3 [I.V.:530; Blood:1048.3] Out: 16109 [Urine:17950]  Intake/Output this shift: Total I/O In: 120 [P.O.:120] Out: 4750 [Urine:4750]  Physical Exam:  General: Alert and oriented CV: No cyanosis Lungs: equal chest rise Abdomen: Soft, NTND, no rebound or guarding Skin: Gu: Three-way catheter in place.  CBI running at fast gtt. Pink lemonade colored irrigant in tubing.  Lab Results: Recent Labs    01/07/23 1700 01/07/23 2320 01/08/23 0546  HGB 6.4* 7.0* 8.5*  HCT 19.9* 21.9* 25.7*   BMET Recent Labs    01/07/23 1700 01/07/23 2320  NA 132* 136  K 3.9 3.9  CL 104 108  CO2 19* 19*  GLUCOSE 216* 196*  BUN 29* 26*  CREATININE 1.65* 1.65*  CALCIUM 8.3* 8.0*     Studies/Results: CT ABDOMEN PELVIS WO CONTRAST  Result Date: 01/07/2023 CLINICAL DATA:  Acute nonlocalized abdominal pain EXAM: CT ABDOMEN AND PELVIS WITHOUT CONTRAST TECHNIQUE: Multidetector CT imaging of the abdomen and pelvis was  performed following the standard protocol without IV contrast. RADIATION DOSE REDUCTION: This exam was performed according to the departmental dose-optimization program which includes automated exposure control, adjustment of the mA and/or kV according to patient size and/or use of iterative reconstruction technique. COMPARISON:  08/18/2022 FINDINGS: Lower chest: No acute abnormality. Mild cardiomegaly. Small hiatal hernia. Hepatobiliary: No focal liver abnormality is seen. No gallstones, gallbladder wall thickening, or biliary dilatation. Pancreas: Unremarkable Spleen: Unremarkable Adrenals/Urinary Tract: The adrenal glands are unremarkable. The kidneys are normal in size and position. Mild-to-moderate bilateral perinephric stranding is present, nonspecific, but progressive since prior examination suggesting un underlying inflammatory process involving the kidneys. No hydronephrosis. Circumferential thickening of the bladder wall is again seen suggesting changes of chronic bladder outlet obstruction. Interval placement of a Foley catheter with its retaining balloon seen within the bladder lumen. Small amount of high density material is seen dependently within the bladder lumen surrounding the Foley catheter balloon likely representing a small blood clot. Stomach/Bowel: Stomach, small bowel, and large bowel are unremarkable. Circumferential thickening of the rectum is again noted, not well characterized on this examination but similar to that noted on prior exam. Mild presacral and perirectal edema is again noted and, together, the findings suggest changes of a chronic infectious or inflammatory proctitis. No free intraperitoneal gas or fluid. Vascular/Lymphatic: Aortic atherosclerosis. No enlarged abdominal or pelvic lymph nodes. Reproductive: Brachytherapy seeds are seen posterior to the prostate gland. The prostate gland is atrophic, particular when compared to remote prior examination of 03/03/2015.  Other: No  abdominal wall hernia Musculoskeletal: Left L4 hemilaminectomy and L4-5 anterior lumbar fusion with instrumentation with solid incorporation of interbody bone graft. Advanced degenerative changes are seen within the lumbar spine. No acute bone abnormality. No lytic or blastic bone lesion. IMPRESSION: 1. Interval placement of a Foley catheter with its retaining balloon seen within the bladder lumen. Small amount of high density material is seen dependently within the bladder lumen surrounding the Foley catheter balloon likely representing a small blood clot. 2. Progressive mild peri-renal stranding, nonspecific, but suggesting an underlying inflammatory process involving the kidneys bilaterally. Correlation with renal function tests and possibly urinalysis and urine culture may be helpful. 3. Stable circumferential thickening of the rectum with mild presacral and perirectal edema suggesting changes of a chronic infectious or inflammatory proctitis. 4. Mild cardiomegaly. Aortic Atherosclerosis (ICD10-I70.0). Electronically Signed   By: Helyn Numbers M.D.   On: 01/07/2023 22:01   CT Cervical Spine Wo Contrast  Result Date: 01/07/2023 CLINICAL DATA:  Poly trauma, blunt EXAM: CT CERVICAL SPINE WITHOUT CONTRAST TECHNIQUE: Multidetector CT imaging of the cervical spine was performed without intravenous contrast. Multiplanar CT image reconstructions were also generated. RADIATION DOSE REDUCTION: This exam was performed according to the departmental dose-optimization program which includes automated exposure control, adjustment of the mA and/or kV according to patient size and/or use of iterative reconstruction technique. COMPARISON:  Cervical spine radiographs 08/22/2005. MRI cervical spine 07/25/2005 FINDINGS: Alignment: Slight anterior subluxation of C7 on T1, unchanged since prior study. Otherwise normal alignment. Normal alignment of the posterior elements. Skull base and vertebrae: Skull base appears intact. No  vertebral compression deformities. No focal bone lesion or bone destruction. Soft tissues and spinal canal: No prevertebral soft tissue swelling. No abnormal paraspinal soft tissue mass or infiltration. Disc levels: Postoperative changes with anterior plate and screw fixation from C4 through C7 with intervertebral fusion. Fusion appears intact. Degenerative changes in the other interspace levels with disc space narrowing and osteophyte formation. Prominent degenerative changes at C1-2. Degenerative changes in the posterior facet joints. Upper chest: Lung apices are clear. Other: Vascular calcifications. IMPRESSION: 1. No change in alignment since previous studies. No acute displaced fractures identified. 2. Anterior plate and screw fixation with intervertebral fusion from C4 through C7 appears intact. 3. Degenerative changes. Electronically Signed   By: Burman Nieves M.D.   On: 01/07/2023 20:12   CT Head Wo Contrast  Result Date: 01/07/2023 CLINICAL DATA:  Poly trauma, blunt EXAM: CT HEAD WITHOUT CONTRAST TECHNIQUE: Contiguous axial images were obtained from the base of the skull through the vertex without intravenous contrast. RADIATION DOSE REDUCTION: This exam was performed according to the departmental dose-optimization program which includes automated exposure control, adjustment of the mA and/or kV according to patient size and/or use of iterative reconstruction technique. COMPARISON:  CT head 07/24/2022.  MRI brain 04/12/2016 FINDINGS: Brain: Diffuse cerebral atrophy. Ventricular dilatation consistent with central atrophy. Low-attenuation changes in the deep white matter consistent with small vessel ischemia. Small focal area of encephalomalacia in the right post frontal gyrus suggesting old infarct. No change since prior study. No abnormal extra-axial fluid collections. No mass effect or midline shift. Gray-white matter junctions are distinct. Basal cisterns are not effaced. No acute intracranial  hemorrhage. Vascular: No hyperdense vessel or unexpected calcification. Skull: Normal. Negative for fracture or focal lesion. Sinuses/Orbits: No acute finding. Other: None. IMPRESSION: No acute intracranial abnormalities. Chronic atrophy and small vessel ischemic changes. Old focal infarct on the right. Electronically Signed   By: Chrissie Noa  Andria Meuse M.D.   On: 01/07/2023 20:06      LOS: 1 day   Elmon Kirschner, NP [facility] Pager: [phone]  01/08/2023, 9:19 AM

## 2023-01-08 NOTE — Assessment & Plan Note (Signed)
Admitted with HgB of 6.4. transfused with 2 units PRBC on admission. 01-08-2023 HgB up to 8.5. due to gross hematuria from presumed radiation cystitis  01-10-2023 blood loss due to radiation cystitis/gross hematuria. HgB down to 7.5 today. Peak of 8.5 g/dl after 2 unit PRBC transfusion on admission. Will go ahead and transfuse 2 units PRBC as pt is symptomatic with SOB and fatigue. IV lasix 20 mg in between units of PRBC.

## 2023-01-08 NOTE — Assessment & Plan Note (Addendum)
01-08-2023 Presumed to be from radiation cystitis. Urology may perform cystoscopy tomorrow.  01-10-2023 had cystoscopy yesterday with fulguration of bleeding areas. Pt will need to go home with foley.

## 2023-01-09 ENCOUNTER — Inpatient Hospital Stay (HOSPITAL_COMMUNITY): Payer: Medicare Other | Admitting: Anesthesiology

## 2023-01-09 ENCOUNTER — Encounter (HOSPITAL_COMMUNITY): Payer: Self-pay | Admitting: Family Medicine

## 2023-01-09 ENCOUNTER — Encounter (HOSPITAL_COMMUNITY)
Admission: EM | Disposition: A | Discharge status: Home / Self Care | Payer: Self-pay | Source: Home / Self Care | Attending: Internal Medicine

## 2023-01-09 DIAGNOSIS — C61 Malignant neoplasm of prostate: Secondary | ICD-10-CM | POA: Diagnosis not present

## 2023-01-09 DIAGNOSIS — D500 Iron deficiency anemia secondary to blood loss (chronic): Secondary | ICD-10-CM | POA: Diagnosis not present

## 2023-01-09 DIAGNOSIS — N179 Acute kidney failure, unspecified: Secondary | ICD-10-CM | POA: Diagnosis not present

## 2023-01-09 DIAGNOSIS — E1165 Type 2 diabetes mellitus with hyperglycemia: Secondary | ICD-10-CM

## 2023-01-09 DIAGNOSIS — D509 Iron deficiency anemia, unspecified: Secondary | ICD-10-CM | POA: Insufficient documentation

## 2023-01-09 DIAGNOSIS — N3040 Irradiation cystitis without hematuria: Secondary | ICD-10-CM | POA: Diagnosis not present

## 2023-01-09 DIAGNOSIS — I5032 Chronic diastolic (congestive) heart failure: Secondary | ICD-10-CM

## 2023-01-09 DIAGNOSIS — I2510 Atherosclerotic heart disease of native coronary artery without angina pectoris: Secondary | ICD-10-CM | POA: Diagnosis not present

## 2023-01-09 HISTORY — PX: CYSTOSCOPY WITH FULGERATION: SHX6638

## 2023-01-09 LAB — COMPREHENSIVE METABOLIC PANEL
ALT: 41 U/L (ref 0–44)
AST: 49 U/L — ABNORMAL HIGH (ref 15–41)
Albumin: 2.9 g/dL — ABNORMAL LOW (ref 3.5–5.0)
Alkaline Phosphatase: 62 U/L (ref 38–126)
Anion gap: 9 (ref 5–15)
BUN: 16 mg/dL (ref 8–23)
CO2: 21 mmol/L — ABNORMAL LOW (ref 22–32)
Calcium: 8.5 mg/dL — ABNORMAL LOW (ref 8.9–10.3)
Chloride: 107 mmol/L (ref 98–111)
Creatinine, Ser: 1.24 mg/dL (ref 0.61–1.24)
GFR, Estimated: 57 mL/min — ABNORMAL LOW (ref >60)
Glucose, Bld: 167 mg/dL — ABNORMAL HIGH (ref 70–99)
Potassium: 3.8 mmol/L (ref 3.5–5.1)
Sodium: 137 mmol/L (ref 135–145)
Total Bilirubin: 0.7 mg/dL (ref 0.3–1.2)
Total Protein: 5.9 g/dL — ABNORMAL LOW (ref 6.5–8.1)

## 2023-01-09 LAB — CBC WITH DIFFERENTIAL/PLATELET
Abs Immature Granulocytes: 0.27 10*3/uL — ABNORMAL HIGH (ref 0.00–0.07)
Basophils Absolute: 0.1 10*3/uL (ref 0.0–0.1)
Basophils Relative: 0 %
Eosinophils Absolute: 0.2 10*3/uL (ref 0.0–0.5)
Eosinophils Relative: 1 %
HCT: 25.6 % — ABNORMAL LOW (ref 39.0–52.0)
Hemoglobin: 8.3 g/dL — ABNORMAL LOW (ref 13.0–17.0)
Immature Granulocytes: 2 %
Lymphocytes Relative: 8 %
Lymphs Abs: 1 10*3/uL (ref 0.7–4.0)
MCH: 30.1 pg (ref 26.0–34.0)
MCHC: 32.4 g/dL (ref 30.0–36.0)
MCV: 92.8 fL (ref 80.0–100.0)
Monocytes Absolute: 1.2 10*3/uL — ABNORMAL HIGH (ref 0.1–1.0)
Monocytes Relative: 9 %
Neutro Abs: 10.3 10*3/uL — ABNORMAL HIGH (ref 1.7–7.7)
Neutrophils Relative %: 80 %
Platelets: 216 10*3/uL (ref 150–400)
RBC: 2.76 MIL/uL — ABNORMAL LOW (ref 4.22–5.81)
RDW: 15.9 % — ABNORMAL HIGH (ref 11.5–15.5)
WBC: 13 10*3/uL — ABNORMAL HIGH (ref 4.0–10.5)
nRBC: 0 % (ref 0.0–0.2)

## 2023-01-09 LAB — GLUCOSE, CAPILLARY
Glucose-Capillary: 148 mg/dL — ABNORMAL HIGH (ref 70–99)
Glucose-Capillary: 134 mg/dL — ABNORMAL HIGH (ref 70–99)
Glucose-Capillary: 145 mg/dL — ABNORMAL HIGH (ref 70–99)
Glucose-Capillary: 170 mg/dL — ABNORMAL HIGH (ref 70–99)
Glucose-Capillary: 291 mg/dL — ABNORMAL HIGH (ref 70–99)

## 2023-01-09 SURGERY — CYSTOSCOPY, WITH BLADDER FULGURATION
Anesthesia: General

## 2023-01-09 MED ORDER — FENTANYL CITRATE PF 50 MCG/ML IJ SOSY
25.0000 ug | PREFILLED_SYRINGE | INTRAMUSCULAR | Status: DC | PRN
  Administered 2023-01-09: 50 ug via INTRAVENOUS

## 2023-01-09 MED ORDER — HYOSCYAMINE SULFATE 0.125 MG SL SUBL
0.2500 mg | SUBLINGUAL_TABLET | SUBLINGUAL | Status: DC | PRN
  Administered 2023-01-09 – 2023-01-11 (×3): 0.25 mg via SUBLINGUAL
  Filled 2023-01-09 (×3): qty 2

## 2023-01-09 MED ORDER — DEXAMETHASONE SODIUM PHOSPHATE 10 MG/ML IJ SOLN
INTRAMUSCULAR | Status: AC
  Filled 2023-01-09: qty 1

## 2023-01-09 MED ORDER — LIDOCAINE HCL (PF) 2 % IJ SOLN
INTRAMUSCULAR | Status: AC
  Filled 2023-01-09: qty 5

## 2023-01-09 MED ORDER — MIDAZOLAM HCL 2 MG/2ML IJ SOLN
0.5000 mg | Freq: Once | INTRAMUSCULAR | Status: DC | PRN
Start: 2023-01-09 — End: 2023-01-09

## 2023-01-09 MED ORDER — LIDOCAINE 2% (20 MG/ML) 5 ML SYRINGE
INTRAMUSCULAR | Status: DC | PRN
  Administered 2023-01-09: 30 mg via INTRAVENOUS

## 2023-01-09 MED ORDER — PROPOFOL 10 MG/ML IV BOLUS
INTRAVENOUS | Status: DC | PRN
  Administered 2023-01-09 (×2): 30 mg via INTRAVENOUS
  Administered 2023-01-09: 100 mg via INTRAVENOUS

## 2023-01-09 MED ORDER — FENTANYL CITRATE PF 50 MCG/ML IJ SOSY
PREFILLED_SYRINGE | INTRAMUSCULAR | Status: AC
  Filled 2023-01-09: qty 1

## 2023-01-09 MED ORDER — PROPOFOL 10 MG/ML IV BOLUS
INTRAVENOUS | Status: AC
  Filled 2023-01-09: qty 20

## 2023-01-09 MED ORDER — ONDANSETRON HCL 4 MG/2ML IJ SOLN
INTRAMUSCULAR | Status: AC
  Filled 2023-01-09: qty 2

## 2023-01-09 MED ORDER — DEXAMETHASONE SODIUM PHOSPHATE 10 MG/ML IJ SOLN
INTRAMUSCULAR | Status: DC | PRN
  Administered 2023-01-09: 4 mg via INTRAVENOUS

## 2023-01-09 MED ORDER — CHLORHEXIDINE GLUCONATE CLOTH 2 % EX PADS
6.0000 | MEDICATED_PAD | Freq: Every day | CUTANEOUS | Status: DC
Start: 2023-01-09 — End: 2023-01-11
  Administered 2023-01-09 – 2023-01-11 (×3): 6 via TOPICAL

## 2023-01-09 MED ORDER — FENTANYL CITRATE (PF) 100 MCG/2ML IJ SOLN
INTRAMUSCULAR | Status: DC | PRN
  Administered 2023-01-09 (×4): 25 ug via INTRAVENOUS
  Administered 2023-01-09: 50 ug via INTRAVENOUS

## 2023-01-09 MED ORDER — ACETAMINOPHEN 10 MG/ML IV SOLN
INTRAVENOUS | Status: DC | PRN
  Administered 2023-01-09: 1000 mg via INTRAVENOUS

## 2023-01-09 MED ORDER — ONDANSETRON HCL 4 MG/2ML IJ SOLN
INTRAMUSCULAR | Status: DC | PRN
  Administered 2023-01-09: 4 mg via INTRAVENOUS

## 2023-01-09 MED ORDER — ACETAMINOPHEN 10 MG/ML IV SOLN
INTRAVENOUS | Status: AC
  Filled 2023-01-09: qty 100

## 2023-01-09 MED ORDER — SODIUM CHLORIDE 0.9 % IR SOLN
Status: DC | PRN
  Administered 2023-01-09 (×3): 3000 mL

## 2023-01-09 MED ORDER — OXYCODONE HCL 5 MG/5ML PO SOLN
5.0000 mg | Freq: Once | ORAL | Status: DC | PRN
Start: 2023-01-09 — End: 2023-01-09

## 2023-01-09 MED ORDER — FENTANYL CITRATE (PF) 100 MCG/2ML IJ SOLN
INTRAMUSCULAR | Status: AC
  Filled 2023-01-09: qty 2

## 2023-01-09 MED ORDER — LACTATED RINGERS IV SOLN: INTRAVENOUS | Status: DC

## 2023-01-09 MED ORDER — OXYCODONE HCL 5 MG PO TABS: 5.0000 mg | ORAL_TABLET | Freq: Once | ORAL | Status: DC | PRN

## 2023-01-09 MED ORDER — CHLORHEXIDINE GLUCONATE 0.12 % MT SOLN
15.0000 mL | Freq: Once | OROMUCOSAL | Status: AC
  Administered 2023-01-09: 15 mL via OROMUCOSAL

## 2023-01-09 SURGICAL SUPPLY — 17 items
BAG DRN RND TRDRP ANRFLXCHMBR (UROLOGICAL SUPPLIES)
BAG URINE DRAIN 2000ML AR STRL (UROLOGICAL SUPPLIES) IMPLANT
BAG URO CATCHER STRL LF (MISCELLANEOUS) ×1 IMPLANT
CATH HEMA 3WAY 30CC 22FR COUDE (CATHETERS) IMPLANT
CLOTH BEACON ORANGE TIMEOUT ST (SAFETY) ×1 IMPLANT
ELECT COAG BIPOLAR CYL 1.2MMM (ELECTROSURGICAL)
ELECT REM PT RETURN 15FT ADLT (MISCELLANEOUS) ×1 IMPLANT
ELECTRODE COAG BIPLR CYL 1.2MM (ELECTROSURGICAL) IMPLANT
GLOVE BIO SURGEON STRL SZ 6.5 (GLOVE) ×1 IMPLANT
GOWN STRL REUS W/ TWL LRG LVL3 (GOWN DISPOSABLE) ×1 IMPLANT
GOWN STRL REUS W/TWL LRG LVL3 (GOWN DISPOSABLE) ×1
KIT TURNOVER KIT A (KITS) IMPLANT
LOOP CUT BIPOLAR 24F LRG (ELECTROSURGICAL) IMPLANT
MANIFOLD NEPTUNE II (INSTRUMENTS) ×1 IMPLANT
PACK CYSTO (CUSTOM PROCEDURE TRAY) ×1 IMPLANT
PAD PREP 24X48 CUFFED NSTRL (MISCELLANEOUS) ×1 IMPLANT
TUBING CONNECTING 10 (TUBING) ×1 IMPLANT

## 2023-01-09 NOTE — Anesthesia Procedure Notes (Signed)
Procedure Name: LMA Insertion Date/Time: 01/09/2023 1:29 PM  Performed by: Elyn Peers, CRNAPre-anesthesia Checklist: Patient identified, Emergency Drugs available, Suction available, Patient being monitored and Timeout performed Patient Re-evaluated:Patient Re-evaluated prior to induction Oxygen Delivery Method: Circle system utilized Preoxygenation: Pre-oxygenation with 100% oxygen Induction Type: IV induction Ventilation: Mask ventilation without difficulty LMA: LMA inserted LMA Size: 4.0 Number of attempts: 1 Placement Confirmation: positive ETCO2 and breath sounds checked- equal and bilateral Tube secured with: Tape Dental Injury: Teeth and Oropharynx as per pre-operative assessment  Comments: Lower teeth avoided during insertion with teeth intact post procedure.

## 2023-01-09 NOTE — Plan of Care (Signed)
  Problem: Education: Goal: Ability to describe self-care measures that may prevent or decrease complications (Diabetes Survival Skills Education) will improve Outcome: Progressing Goal: Individualized Educational Video(s) Outcome: Progressing   Problem: Coping: Goal: Ability to adjust to condition or change in health will improve Outcome: Progressing   

## 2023-01-09 NOTE — Progress Notes (Signed)
Subjective: No acute events overnight.  Patient is still having difficulty with spasm.  On my arrival to the room CBI was running at a medium gtt. with clear irrigant.  Clamped and rechecked a few hours later find it restarted to low gtt. with pink lemonade colored irrigant and no clot material  Objective: Vital signs in last 24 hours: Temp:  [97.8 F (36.6 C)-98.8 F (37.1 C)] 98.7 F (37.1 C) (10/22 0526) Pulse Rate:  [67-78] 76 (10/22 0526) Resp:  [14] 14 (10/22 0526) BP: (123-132)/(55-65) 123/62 (10/22 0526) SpO2:  [95 %-99 %] 95 % (10/22 0526)  Assessment/Plan: 87 year old male with Hx of prostate cancer-s/p XRT and ADT in 2017, radiation cystitis, TURP, CIC 3 times daily, now with hematuria.  #Radiation cystitis vs. rebleed at prostatic fossa #Hematuria  Trend H&H.  2u PRBC 10/20. HgB 6.4-->7.0-->8.5-->8.3  Continue three-way Foley catheter and CBI for the time being.  Improved but ongoing bleeding.  S/p TXA x 2 10/21  Cystoscopy with fulguration today around 2p w/ Dr. Arita Miss  #bladder spasm Tolerating the hyoscyamine which is helping, but patient is still experiencing some painful spasm.  Will trial the higher 0.25 dose as needed.  Intake/Output from previous day: 10/21 0701 - 10/22 0700 In: 33643.3 [P.O.:360; IV Piggyback:283.3] Out: 60737 [Urine:40250]  Intake/Output this shift: Total I/O In: -  Out: 2850 [Urine:2850]  Physical Exam:  General: Alert and oriented CV: No cyanosis Lungs: equal chest rise Gu: Three-way catheter in place.  CBI running at slow. Pink lemonade colored irrigant in tubing.  Lab Results: Recent Labs    01/07/23 2320 01/08/23 0546 01/09/23 0710  HGB 7.0* 8.5* 8.3*  HCT 21.9* 25.7* 25.6*   BMET Recent Labs    01/07/23 2320 01/09/23 0710  NA 136 137  K 3.9 3.8  CL 108 107  CO2 19* 21*  GLUCOSE 196* 167*  BUN 26* 16  CREATININE 1.65* 1.24  CALCIUM 8.0* 8.5*     Studies/Results: CT ABDOMEN PELVIS WO  CONTRAST  Result Date: 01/07/2023 CLINICAL DATA:  Acute nonlocalized abdominal pain EXAM: CT ABDOMEN AND PELVIS WITHOUT CONTRAST TECHNIQUE: Multidetector CT imaging of the abdomen and pelvis was performed following the standard protocol without IV contrast. RADIATION DOSE REDUCTION: This exam was performed according to the departmental dose-optimization program which includes automated exposure control, adjustment of the mA and/or kV according to patient size and/or use of iterative reconstruction technique. COMPARISON:  08/18/2022 FINDINGS: Lower chest: No acute abnormality. Mild cardiomegaly. Small hiatal hernia. Hepatobiliary: No focal liver abnormality is seen. No gallstones, gallbladder wall thickening, or biliary dilatation. Pancreas: Unremarkable Spleen: Unremarkable Adrenals/Urinary Tract: The adrenal glands are unremarkable. The kidneys are normal in size and position. Mild-to-moderate bilateral perinephric stranding is present, nonspecific, but progressive since prior examination suggesting un underlying inflammatory process involving the kidneys. No hydronephrosis. Circumferential thickening of the bladder wall is again seen suggesting changes of chronic bladder outlet obstruction. Interval placement of a Foley catheter with its retaining balloon seen within the bladder lumen. Small amount of high density material is seen dependently within the bladder lumen surrounding the Foley catheter balloon likely representing a small blood clot. Stomach/Bowel: Stomach, small bowel, and large bowel are unremarkable. Circumferential thickening of the rectum is again noted, not well characterized on this examination but similar to that noted on prior exam. Mild presacral and perirectal edema is again noted and, together, the findings suggest changes of a chronic infectious or inflammatory proctitis. No free intraperitoneal gas or fluid. Vascular/Lymphatic:  Aortic atherosclerosis. No enlarged abdominal or pelvic  lymph nodes. Reproductive: Brachytherapy seeds are seen posterior to the prostate gland. The prostate gland is atrophic, particular when compared to remote prior examination of 03/03/2015. Other: No abdominal wall hernia Musculoskeletal: Left L4 hemilaminectomy and L4-5 anterior lumbar fusion with instrumentation with solid incorporation of interbody bone graft. Advanced degenerative changes are seen within the lumbar spine. No acute bone abnormality. No lytic or blastic bone lesion. IMPRESSION: 1. Interval placement of a Foley catheter with its retaining balloon seen within the bladder lumen. Small amount of high density material is seen dependently within the bladder lumen surrounding the Foley catheter balloon likely representing a small blood clot. 2. Progressive mild peri-renal stranding, nonspecific, but suggesting an underlying inflammatory process involving the kidneys bilaterally. Correlation with renal function tests and possibly urinalysis and urine culture may be helpful. 3. Stable circumferential thickening of the rectum with mild presacral and perirectal edema suggesting changes of a chronic infectious or inflammatory proctitis. 4. Mild cardiomegaly. Aortic Atherosclerosis (ICD10-I70.0). Electronically Signed   By: Helyn Numbers M.D.   On: 01/07/2023 22:01   CT Cervical Spine Wo Contrast  Result Date: 01/07/2023 CLINICAL DATA:  Poly trauma, blunt EXAM: CT CERVICAL SPINE WITHOUT CONTRAST TECHNIQUE: Multidetector CT imaging of the cervical spine was performed without intravenous contrast. Multiplanar CT image reconstructions were also generated. RADIATION DOSE REDUCTION: This exam was performed according to the departmental dose-optimization program which includes automated exposure control, adjustment of the mA and/or kV according to patient size and/or use of iterative reconstruction technique. COMPARISON:  Cervical spine radiographs 08/22/2005. MRI cervical spine 07/25/2005 FINDINGS: Alignment:  Slight anterior subluxation of C7 on T1, unchanged since prior study. Otherwise normal alignment. Normal alignment of the posterior elements. Skull base and vertebrae: Skull base appears intact. No vertebral compression deformities. No focal bone lesion or bone destruction. Soft tissues and spinal canal: No prevertebral soft tissue swelling. No abnormal paraspinal soft tissue mass or infiltration. Disc levels: Postoperative changes with anterior plate and screw fixation from C4 through C7 with intervertebral fusion. Fusion appears intact. Degenerative changes in the other interspace levels with disc space narrowing and osteophyte formation. Prominent degenerative changes at C1-2. Degenerative changes in the posterior facet joints. Upper chest: Lung apices are clear. Other: Vascular calcifications. IMPRESSION: 1. No change in alignment since previous studies. No acute displaced fractures identified. 2. Anterior plate and screw fixation with intervertebral fusion from C4 through C7 appears intact. 3. Degenerative changes. Electronically Signed   By: Burman Nieves M.D.   On: 01/07/2023 20:12   CT Head Wo Contrast  Result Date: 01/07/2023 CLINICAL DATA:  Poly trauma, blunt EXAM: CT HEAD WITHOUT CONTRAST TECHNIQUE: Contiguous axial images were obtained from the base of the skull through the vertex without intravenous contrast. RADIATION DOSE REDUCTION: This exam was performed according to the departmental dose-optimization program which includes automated exposure control, adjustment of the mA and/or kV according to patient size and/or use of iterative reconstruction technique. COMPARISON:  CT head 07/24/2022.  MRI brain 04/12/2016 FINDINGS: Brain: Diffuse cerebral atrophy. Ventricular dilatation consistent with central atrophy. Low-attenuation changes in the deep white matter consistent with small vessel ischemia. Small focal area of encephalomalacia in the right post frontal gyrus suggesting old infarct. No  change since prior study. No abnormal extra-axial fluid collections. No mass effect or midline shift. Gray-white matter junctions are distinct. Basal cisterns are not effaced. No acute intracranial hemorrhage. Vascular: No hyperdense vessel or unexpected calcification. Skull: Normal. Negative for fracture or  focal lesion. Sinuses/Orbits: No acute finding. Other: None. IMPRESSION: No acute intracranial abnormalities. Chronic atrophy and small vessel ischemic changes. Old focal infarct on the right. Electronically Signed   By: Burman Nieves M.D.   On: 01/07/2023 20:06      LOS: 2 days   Elmon Kirschner, NP [facility] Pager: [phone]  01/09/2023, 11:16 AM

## 2023-01-09 NOTE — Interval H&P Note (Signed)
History and Physical Interval Note: Patient urine is still dark red despite CBI.   01/09/2023 12:49 PM  [name]  has presented today for surgery, with the diagnosis of CLOT EVACUATION.  The various methods of treatment have been discussed with the patient and family. After consideration of risks, benefits and other options for treatment, the patient has consented to  Procedure(s): CYSTOSCOPY WITH CLOT EVACUATION, POSSIBLE FULGERATION (N/A) as a surgical intervention.  The patient's history has been reviewed, patient examined, no change in status, stable for surgery.  I have reviewed the patient's chart and labs.  Questions were answered to the patient's satisfaction.     [name]

## 2023-01-09 NOTE — Anesthesia Postprocedure Evaluation (Signed)
Anesthesia Post Note  Patient: [name]  Procedure(s) Performed: CYSTOSCOPY WITH CLOT EVACUATION, FULGERATION     Patient location during evaluation: PACU Anesthesia Type: General Level of consciousness: awake and alert, patient cooperative and oriented Pain management: pain level controlled Vital Signs Assessment: post-procedure vital signs reviewed and stable Respiratory status: spontaneous breathing, nonlabored ventilation and respiratory function stable Cardiovascular status: blood pressure returned to baseline and stable Postop Assessment: no apparent nausea or vomiting Anesthetic complications: no   No notable events documented.  Last Vitals:  Vitals:   01/09/23 1430 01/09/23 1445  BP: 125/73 (!) 141/56  Pulse: 70 70  Resp:  17  Temp:    SpO2: 96% 95%    Last Pain:  Vitals:   01/09/23 1415  TempSrc:   PainSc: 0-No pain                 Azaliah Carrero,E. Markis Langland

## 2023-01-09 NOTE — Anesthesia Preprocedure Evaluation (Addendum)
Anesthesia Evaluation  Patient identified by MRN, date of birth, ID band Patient awake    Reviewed: Allergy & Precautions, NPO status , Patient's Chart, lab work & pertinent test results, reviewed documented beta blocker date and time   History of Anesthesia Complications Negative for: history of anesthetic complications  Airway Mallampati: II  TM Distance: >3 FB Neck ROM: Full    Dental  (+) Poor Dentition, Missing, Dental Advisory Given, Chipped   Pulmonary former smoker   breath sounds clear to auscultation       Cardiovascular hypertension, Pt. on medications and Pt. on home beta blockers (-) angina + CAD, + Cardiac Stents, + CABG and + Peripheral Vascular Disease  + dysrhythmias (s/p ablation) Atrial Fibrillation + Valvular Problems/Murmurs (s/p MVR)  Rhythm:Irregular Rate:Normal + Systolic murmurs 08/2950 ECHO: EF 55-60%, normal LVF, mildly reduced RVF, mod pulm HTN, mild MS with annuloplasty ring in place, mean gradient 9 mmHg, mild-mod TR, s/p bioprosthetic AVR, mean gradient 17 mmHg   Neuro/Psych    Depression    CVA    GI/Hepatic Neg liver ROS,GERD  Medicated and Controlled,,  Endo/Other  diabetes (glu 134), Oral Hypoglycemic Agents    Renal/GU Renal InsufficiencyRenal disease   H/o prostate cancer    Musculoskeletal  (+) Arthritis ,    Abdominal   Peds  Hematology Hb 8.3, plt 216k   Anesthesia Other Findings   Reproductive/Obstetrics                             Anesthesia Physical Anesthesia Plan  ASA: 4  Anesthesia Plan: General   Post-op Pain Management: Tylenol PO (pre-op)*   Induction: Intravenous  PONV Risk Score and Plan: 2 and Dexamethasone and Ondansetron  Airway Management Planned: LMA  Additional Equipment:   Intra-op Plan:   Post-operative Plan:   Informed Consent: I have reviewed the patients History and Physical, chart, labs and discussed the procedure  including the risks, benefits and alternatives for the proposed anesthesia with the patient or authorized representative who has indicated his/her understanding and acceptance.     Dental advisory given  Plan Discussed with: CRNA and Surgeon  Anesthesia Plan Comments:        Anesthesia Quick Evaluation

## 2023-01-09 NOTE — Transfer of Care (Signed)
Immediate Anesthesia Transfer of Care Note  Patient: [name]  Procedure(s) Performed: CYSTOSCOPY WITH CLOT EVACUATION, FULGERATION  Patient Location: PACU  Anesthesia Type:General  Level of Consciousness: awake and responds to stimulation  Airway & Oxygen Therapy: Patient Spontanous Breathing and Patient connected to face mask oxygen  Post-op Assessment: Report given to RN and Post -op Vital signs reviewed and stable  Post vital signs: Reviewed and stable  Last Vitals:  Vitals Value Taken Time  BP 120/54 01/09/23 1412  Temp    Pulse 72 01/09/23 1413  Resp 18 01/09/23 1414  SpO2 100 % 01/09/23 1413  Vitals shown include unfiled device data.  Last Pain:  Vitals:   01/09/23 1236  TempSrc: Oral  PainSc: 0-No pain         Complications: No notable events documented.

## 2023-01-09 NOTE — Progress Notes (Signed)
OT Cancellation Note  Patient Details Name: [name] MRN: [id number] DOB: 10-07-1935   Cancelled Treatment:    Reason Eval/Treat Not Completed: Pain limiting ability to participate. Chart reviewed, pt received with family in room. Pt adamantly refusing OT eval this date. Reports 0/10 pain at rest, but is concerned that pain will increase with mobility. Having procedure this afternoon. OT attempted to offer toileting or repositioning with pt continuing to refuse. OT will re-attempt as able.   Normalee Sistare L. Elva Breaker, OTR/L  01/09/23, 10:43 AM

## 2023-01-09 NOTE — Op Note (Signed)
Operative Note  Preoperative diagnosis:  1.  Gross hematuria 2.  Prostate cancer  Postoperative diagnosis: 1.  Radiation cystitis 2.  Prostate cancer  Procedure(s): 1.  Cystoscopy with clot evacuation and fulguration  Surgeon: Kasandra Knudsen, MD  Assistants:  None  Anesthesia:  General  Complications:  None  EBL: 250 cc old clot  Specimens: 1.  None  Drains/Catheters: 1.  22 French three-way Foley catheter with continuous bladder irrigation restarted  Intraoperative findings:   Normal anterior urethra Wide open bladder neck with evidence of prior TUR.  Necrotic prostatic urethral tissue sloughing and bleeding.  Prostatic urethra was fixed limiting movement of scope. Bilateral orthotopic ureteral orifices seen at start end of case Moderate trabeculation Changes of bladder mucosa consistent with radiation cystitis.  Fulgurated.  Indication:  [name] is a 87 y.o. male with history of prostate cancer as well as BPH with LUTS who underwent TURP and radiation now managing bladder with CIC.  He is having several weeks of gross hematuria and was admitted to the hospital for acute blood loss anemia.  A 22 French three-way Foley catheter was placed and continuous bladder irrigation was initiated.  His urine remains bloody after a day and a half of CBI.  Description of procedure:  After risks and benefits of the procedure were discussed with the patient, informed consent was obtained.  The patient was taken the operating placed in supine position.  Anesthesia was induced and antibiotics were administered.  Patient was then repositioned in the dorsolithotomy position.  He was prepped and draped in the usual sterile fashion and a time out was performed.   The existing Foley catheter was removed prior to prepping the patient.  The 24Fr resectoscope using the visual obturator was placed in the urethral meatus and advanced into the bladder under direct visualization.  Moderate  amount of old clot was seen in the bladder.  A Toomey syringe was then used to evacuate the clot through the sheath.  The obturator was removed and the bipolar loop was assembled and the working element.  Bladder mucosa was evaluated and consistent with radiation cystitis.  These areas were fulgurated with bipolar electrocautery loop.  Care was taken to avoid the ureteral orifices.  The prostatic urethra was also friable and fulgurated.  The bladder neck and prostatic urethra were noted to be wide open.  Hemostasis was adequate with the irrigant turned off.  The resectoscope removed and a 22 French three-way Foley catheter was placed.  The balloon was inflated with 30 cc sterile water.  Continuous bladder irrigation was restarted.  The patient emerged from anesthesia and transferred the PACU in stable condition.  Plan: 10 French three-way Foley catheter was replaced and CBI restarted.  He will be transferred back to the floor.  Will wean CBI as tolerated.

## 2023-01-09 NOTE — Assessment & Plan Note (Signed)
Iron studies iron deficiency anemia.  Iron 43(range 45-182). % saturation 15. Likely due to long term microscopic hematuria. Will need oral iron therapy at discharge. And routine monitoring of his HgB as outpatient.

## 2023-01-09 NOTE — Progress Notes (Signed)
PROGRESS NOTE    [name]  MVH:[id number] DOB: April 27, 1935 DOA: 01/07/2023 PCP: Renford Dills, MD  Subjective: Pt seen and examined. Continues with CBI.  Stable overnight. Remains NPO today for cystoscopy at 2 pm. HgB stable at 8.3  Urine cx growing Klebsiella and E. Coli. Final sensitivities pending. Remains on IV Rocephin.   Hospital Course: HPI: This is a 87 year old male with past medical history of DM2, HTN, HLD, CHF, CAD/CABG/stent, carotid artery disease s/p CEA, bioprosthetic mitral valve, A-flutter on aspirin only,, stroke, GERD, esophageal stricture, depression, chronic anemia, OSA, spinal stenosis, nephrolithiasis, prostate cancer s/p radiation and TURP 2017.  Patient self caths 3 times daily.  He presents with complaints of gross hematuria ongoing for approximately 2 weeks.  He states he has bright red blood as well as lots of clots.  He denies fever but endorses chills.  He denies nausea or vomiting but endorses mild right-sided flank discomfort every now and then.  He states he been getting weaker and has fallen twice Tuesday and Thursday.  He did hit his head but denies neck pain.  He states he is not lightheaded or dizzy just weak.  He denies chest pains.  He lives at home with his wife and uses a walker at baseline.     In the ER patient is hemodynamically stable.  Foley placed has return of gross hematuria.  Hemoglobin 6.4.  Platelets normal.  INR 1.2.  UA hemoglobin larger, leukocyte small ketones negative, bacteria few.  Urine culture, blood cultures ordered blood transfusion ordered, anemia panel ordered.  Urology consult placed by epic by EDP. Significant Events: Admitted 01/07/2023   Significant Labs: Admitting HgB 6.4, BUN 29, Scr 1.65  Significant Imaging Studies:   Antibiotic Therapy: Anti-infectives (From admission, onward)    Start     Dose/Rate Route Frequency Ordered Stop   01/08/23 1000  cefTRIAXone (ROCEPHIN) 2 g in sodium chloride 0.9 % 100  mL IVPB        2 g 200 mL/hr over 30 Minutes Intravenous Every 24 hours 01/07/23 2044     01/07/23 1730  cefTRIAXone (ROCEPHIN) 2 g in sodium chloride 0.9 % 100 mL IVPB        2 g 200 mL/hr over 30 Minutes Intravenous  Once 01/07/23 1726 01/07/23 1936       Procedures: 01-07-2023 - insertion 3 way CBI catheter 12-28-2022 transfusion 2 units PRBC  Consultants: Urology - [facility]    Assessment and Plan: * Acute blood loss anemia (ABLA) Admitted with HgB of 6.4. transfused with 2 units PRBC on admission.  01-08-2023 HgB up to 8.5. due to gross hematuria from presumed radiation cystitis  AKI (acute kidney injury) (HCC) Cause of AKI is multifactorial. Can include radiation cystitis, gross hematuria, Acute blood loss and UTI.  Continue to monitor Scr. Holding ARB and lasix for now. 01-09-2023. Scr down to 1.24 today. Was 1.65 yesterday. AKI now resolved.  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 Urology is following.  Acute cystitis with hematuria 01-08-2023 remains on IV Rocephin 2 g daily. Awaiting urine culture 01-09-2023 urine cx growing E. Coli and Klebsiella. Awaiting final sensitivities. Continue with IV Rocephin for now. Blood cx negative thus far.  Gross hematuria 01-08-2023 Presumed to be from radiation cystitis. Urology may perform cystoscopy tomorrow.   Chronic heart failure with preserved ejection fraction (HCC) Stable. Holding lasix and ARB due to AKI.  Hyperlipidemia Continue crestor 10 mg daily.  Controlled type 2 diabetes  mellitus with hyperglycemia (HCC) Continue with SSI. And 70/30 mix insulin. Due to stress from hematuria and UTI.  Essential hypertension Stable. Holding lasix and ARB due to AKI  CAD (coronary artery disease) Stable. Hold ASA due to gross hematuria.  Iron deficiency anemia Iron studies iron deficiency anemia.  Iron 43(range 45-182). % saturation 15. Likely due to long term microscopic  hematuria. Will need oral iron therapy at discharge. And routine monitoring of his HgB as outpatient.   DVT prophylaxis: SCDs Start: 01/07/23 2046    Code Status: Full Code Family Communication: no family at bedside. Updated wife katherine via phone. Disposition Plan: return home Reason for continuing need for hospitalization: will go to surgery today. Remains on IV ABX.  Objective: Vitals:   01/08/23 1308 01/08/23 1628 01/08/23 2109 01/09/23 0526  BP: (!) 127/55 132/65 123/63 123/62  Pulse: 70 67 78 76  Resp:   14 14  Temp: 98.1 F (36.7 C) 97.8 F (36.6 C) 98.8 F (37.1 C) 98.7 F (37.1 C)  TempSrc: Oral Oral Oral Oral  SpO2: 99% 99% 98% 95%  Weight:      Height:        Intake/Output Summary (Last 24 hours) at 01/09/2023 1051 Last data filed at 01/09/2023 1019 Gross per 24 hour  Intake 27523.33 ml  Output [address] ml  Net -4826.67 ml   Filed Weights   01/07/23 1546 01/07/23 1553 01/07/23 2221  Weight: 68 kg 65.8 kg 76.5 kg    Examination:  Physical Exam Vitals and nursing note reviewed.  Constitutional:      General: He is not in acute distress.    Appearance: He is not toxic-appearing or diaphoretic.  HENT:     Head: Normocephalic and atraumatic.     Nose: Nose normal.  Eyes:     General: No scleral icterus. Cardiovascular:     Rate and Rhythm: Normal rate and regular rhythm.     Pulses: Normal pulses.  Pulmonary:     Effort: Pulmonary effort is normal.     Breath sounds: Normal breath sounds.  Abdominal:     General: Bowel sounds are normal. There is no distension.     Palpations: Abdomen is soft.  Genitourinary:    Comments: Foley still with bloody urine. Musculoskeletal:     Right lower leg: No edema.     Left lower leg: No edema.  Skin:    General: Skin is warm and dry.     Capillary Refill: Capillary refill takes less than 2 seconds.  Neurological:     General: No focal deficit present.     Mental Status: He is alert and oriented to person,  place, and time.     Data Reviewed: I have personally reviewed following labs and imaging studies  CBC: Recent Labs  Lab 01/07/23 1700 01/07/23 2320 01/08/23 0546 01/09/23 0710  WBC 13.2*  --   --  13.0*  NEUTROABS 10.1*  --   --  10.3*  HGB 6.4* 7.0* 8.5* 8.3*  HCT 19.9* 21.9* 25.7* 25.6*  MCV 92.6  --   --  92.8  PLT 248  --   --  216   Basic Metabolic Panel: Recent Labs  Lab 01/07/23 1700 01/07/23 2320 01/09/23 0710  NA 132* 136 137  K 3.9 3.9 3.8  CL 104 108 107  CO2 19* 19* 21*  GLUCOSE 216* 196* 167*  BUN 29* 26* 16  CREATININE 1.65* 1.65* 1.24  CALCIUM 8.3* 8.0* 8.5*  MG  --  1.8  --    GFR: Estimated Creatinine Clearance: 40 mL/min (by C-G formula based on SCr of 1.24 mg/dL). Liver Function Tests: Recent Labs  Lab 01/07/23 1700 01/07/23 2320 01/09/23 0710  AST 32 45* 49*  ALT 25 29 41  ALKPHOS 57 65 62  BILITOT 0.6 0.6 0.7  PROT 6.9 5.8* 5.9*  ALBUMIN 3.3* 2.9* 2.9*   Coagulation Profile: Recent Labs  Lab 01/07/23 1700 01/07/23 2320  INR 1.2 1.3*   BNP (last 3 results) Recent Labs    07/24/22 2016  BNP 282.6*   CBG: Recent Labs  Lab 01/08/23 0722 01/08/23 1142 01/08/23 1634 01/08/23 2112 01/09/23 0730  GLUCAP 172* 157* 196* 167* 148*   Anemia Panel: Recent Labs    01/07/23 2320  VITAMINB12 253  FOLATE 18.2  FERRITIN 51  TIBC 281  IRON 43*  RETICCTPCT 2.5    Recent Results (from the past 240 hour(s))  Urine Culture     Status: Abnormal (Preliminary result)   Collection Time: 01/07/23  6:39 PM   Specimen: Urine, Catheterized  Result Value Ref Range Status   Specimen Description   Final    URINE, CATHETERIZED Performed at [facility], [address], [place], [place] [address]    Special Requests   Final    NONE Performed at [facility], [address], [place], [place] [address]    Culture (A)  Final    50,000 COLONIES/mL ESCHERICHIA COLI >=100,000 COLONIES/mL KLEBSIELLA  PNEUMONIAE SUSCEPTIBILITIES TO FOLLOW Performed at [facility], [address] [place], [place] [address]    Report Status PENDING  Incomplete  SARS Coronavirus 2 by RT PCR (hospital order, performed in [facility] hospital lab) *cepheid single result test* Anterior Nasal Swab     Status: None   Collection Time: 01/07/23  9:42 PM   Specimen: Anterior Nasal Swab  Result Value Ref Range Status   SARS Coronavirus 2 by RT PCR NEGATIVE NEGATIVE Final    Comment: (NOTE) SARS-CoV-2 target nucleic acids are NOT DETECTED.  The SARS-CoV-2 RNA is generally detectable in upper and lower respiratory specimens during the acute phase of infection. The lowest concentration of SARS-CoV-2 viral copies this assay can detect is 250 copies / mL. A negative result does not preclude SARS-CoV-2 infection and should not be used as the sole basis for treatment or other patient management decisions.  A negative result may occur with improper specimen collection / handling, submission of specimen other than nasopharyngeal swab, presence of viral mutation(s) within the areas targeted by this assay, and inadequate number of viral copies (<250 copies / mL). A negative result must be combined with clinical observations, patient history, and epidemiological information.  Fact Sheet for Patients:   RoadLapTop.co.za  Fact Sheet for Healthcare Providers: http://kim-miller.com/  This test is not yet approved or  cleared by the [place] FDA and has been authorized for detection and/or diagnosis of SARS-CoV-2 by FDA under an Emergency Use Authorization (EUA).  This EUA will remain in effect (meaning this test can be used) for the duration of the COVID-19 declaration under Section 564(b)(1) of the Act, 21 U.S.C. section 360bbb-3(b)(1), unless the authorization is terminated or revoked sooner.  Performed at [facility], [address], [place], [place] [address]   Culture, blood (Routine X 2) w Reflex to ID Panel     Status: None (Preliminary result)   Collection Time: 01/07/23 11:20 PM   Specimen: BLOOD  Result  Value Ref Range Status   Specimen Description   Final    BLOOD BLOOD LEFT HAND AEROBIC BOTTLE ONLY Performed at [facility], [address], [place], [place] [address]    Special Requests   Final    Blood Culture adequate volume BOTTLES DRAWN AEROBIC ONLY Performed at [facility], [address] [place], [place] [address]    Culture   Final    NO GROWTH 1 DAY Performed at [facility], [address] [place], [place] [address]    Report Status PENDING  Incomplete  Culture, blood (Routine X 2) w Reflex to ID Panel     Status: None (Preliminary result)   Collection Time: 01/07/23 11:20 PM   Specimen: BLOOD  Result Value Ref Range Status   Specimen Description   Final    BLOOD BLOOD LEFT HAND AEROBIC BOTTLE ONLY Performed at [facility], [address] [place], [place] [address]    Special Requests   Final    Blood Culture adequate volume BOTTLES DRAWN AEROBIC ONLY Performed at [facility], [address] [place], [place] [address]    Culture   Final    NO GROWTH 1 DAY Performed at [facility], [address], [place], [place] [address]    Report Status PENDING  Incomplete     Radiology Studies: CT ABDOMEN PELVIS WO CONTRAST  Result Date: 01/07/2023 CLINICAL DATA:  Acute nonlocalized abdominal pain EXAM: CT ABDOMEN AND PELVIS WITHOUT CONTRAST TECHNIQUE: Multidetector CT imaging of the abdomen and pelvis was performed following the standard protocol without IV contrast. RADIATION DOSE REDUCTION: This exam was performed according to the departmental dose-optimization program which includes automated exposure control, adjustment of the mA and/or kV according to patient size and/or use of iterative  reconstruction technique. COMPARISON:  08/18/2022 FINDINGS: Lower chest: No acute abnormality. Mild cardiomegaly. Small hiatal hernia. Hepatobiliary: No focal liver abnormality is seen. No gallstones, gallbladder wall thickening, or biliary dilatation. Pancreas: Unremarkable Spleen: Unremarkable Adrenals/Urinary Tract: The adrenal glands are unremarkable. The kidneys are normal in size and position. Mild-to-moderate bilateral perinephric stranding is present, nonspecific, but progressive since prior examination suggesting un underlying inflammatory process involving the kidneys. No hydronephrosis. Circumferential thickening of the bladder wall is again seen suggesting changes of chronic bladder outlet obstruction. Interval placement of a Foley catheter with its retaining balloon seen within the bladder lumen. Small amount of high density material is seen dependently within the bladder lumen surrounding the Foley catheter balloon likely representing a small blood clot. Stomach/Bowel: Stomach, small bowel, and large bowel are unremarkable. Circumferential thickening of the rectum is again noted, not well characterized on this examination but similar to that noted on prior exam. Mild presacral and perirectal edema is again noted and, together, the findings suggest changes of a chronic infectious or inflammatory proctitis. No free intraperitoneal gas or fluid. Vascular/Lymphatic: Aortic atherosclerosis. No enlarged abdominal or pelvic lymph nodes. Reproductive: Brachytherapy seeds are seen posterior to the prostate gland. The prostate gland is atrophic, particular when compared to remote prior examination of 03/03/2015. Other: No abdominal wall hernia Musculoskeletal: Left L4 hemilaminectomy and L4-5 anterior lumbar fusion with instrumentation with solid incorporation of interbody bone graft. Advanced degenerative changes are seen within the lumbar spine. No acute bone abnormality. No lytic or blastic bone lesion.  IMPRESSION: 1. Interval placement of a Foley catheter with its retaining balloon seen within the bladder lumen. Small amount of high density material is  seen dependently within the bladder lumen surrounding the Foley catheter balloon likely representing a small blood clot. 2. Progressive mild peri-renal stranding, nonspecific, but suggesting an underlying inflammatory process involving the kidneys bilaterally. Correlation with renal function tests and possibly urinalysis and urine culture may be helpful. 3. Stable circumferential thickening of the rectum with mild presacral and perirectal edema suggesting changes of a chronic infectious or inflammatory proctitis. 4. Mild cardiomegaly. Aortic Atherosclerosis (ICD10-I70.0). Electronically Signed   By: Helyn Numbers M.D.   On: 01/07/2023 22:01   CT Cervical Spine Wo Contrast  Result Date: 01/07/2023 CLINICAL DATA:  Poly trauma, blunt EXAM: CT CERVICAL SPINE WITHOUT CONTRAST TECHNIQUE: Multidetector CT imaging of the cervical spine was performed without intravenous contrast. Multiplanar CT image reconstructions were also generated. RADIATION DOSE REDUCTION: This exam was performed according to the departmental dose-optimization program which includes automated exposure control, adjustment of the mA and/or kV according to patient size and/or use of iterative reconstruction technique. COMPARISON:  Cervical spine radiographs 08/22/2005. MRI cervical spine 07/25/2005 FINDINGS: Alignment: Slight anterior subluxation of C7 on T1, unchanged since prior study. Otherwise normal alignment. Normal alignment of the posterior elements. Skull base and vertebrae: Skull base appears intact. No vertebral compression deformities. No focal bone lesion or bone destruction. Soft tissues and spinal canal: No prevertebral soft tissue swelling. No abnormal paraspinal soft tissue mass or infiltration. Disc levels: Postoperative changes with anterior plate and screw fixation from C4  through C7 with intervertebral fusion. Fusion appears intact. Degenerative changes in the other interspace levels with disc space narrowing and osteophyte formation. Prominent degenerative changes at C1-2. Degenerative changes in the posterior facet joints. Upper chest: Lung apices are clear. Other: Vascular calcifications. IMPRESSION: 1. No change in alignment since previous studies. No acute displaced fractures identified. 2. Anterior plate and screw fixation with intervertebral fusion from C4 through C7 appears intact. 3. Degenerative changes. Electronically Signed   By: Burman Nieves M.D.   On: 01/07/2023 20:12   CT Head Wo Contrast  Result Date: 01/07/2023 CLINICAL DATA:  Poly trauma, blunt EXAM: CT HEAD WITHOUT CONTRAST TECHNIQUE: Contiguous axial images were obtained from the base of the skull through the vertex without intravenous contrast. RADIATION DOSE REDUCTION: This exam was performed according to the departmental dose-optimization program which includes automated exposure control, adjustment of the mA and/or kV according to patient size and/or use of iterative reconstruction technique. COMPARISON:  CT head 07/24/2022.  MRI brain 04/12/2016 FINDINGS: Brain: Diffuse cerebral atrophy. Ventricular dilatation consistent with central atrophy. Low-attenuation changes in the deep white matter consistent with small vessel ischemia. Small focal area of encephalomalacia in the right post frontal gyrus suggesting old infarct. No change since prior study. No abnormal extra-axial fluid collections. No mass effect or midline shift. Gray-white matter junctions are distinct. Basal cisterns are not effaced. No acute intracranial hemorrhage. Vascular: No hyperdense vessel or unexpected calcification. Skull: Normal. Negative for fracture or focal lesion. Sinuses/Orbits: No acute finding. Other: None. IMPRESSION: No acute intracranial abnormalities. Chronic atrophy and small vessel ischemic changes. Old focal  infarct on the right. Electronically Signed   By: Burman Nieves M.D.   On: 01/07/2023 20:06    Scheduled Meds:  finasteride  5 mg Oral Daily   FLUoxetine  20 mg Oral QHS   insulin aspart  0-15 Units Subcutaneous TID WC   insulin aspart  0-5 Units Subcutaneous QHS   insulin aspart protamine- aspart  14 Units Subcutaneous BID WC   melatonin  5 mg Oral QHS  rosuvastatin  10 mg Oral BH-q7a   tamsulosin  0.4 mg Oral Daily   Continuous Infusions:  cefTRIAXone (ROCEPHIN)  IV Stopped (01/08/23 1054)   sodium chloride irrigation       LOS: 2 days   Time spent: 40 minutes  Carollee Herter, DO  [facility]  01/09/2023, 10:51 AM

## 2023-01-10 ENCOUNTER — Encounter (HOSPITAL_COMMUNITY): Payer: Self-pay | Admitting: Urology

## 2023-01-10 ENCOUNTER — Other Ambulatory Visit (HOSPITAL_COMMUNITY): Payer: Self-pay

## 2023-01-10 DIAGNOSIS — N179 Acute kidney failure, unspecified: Secondary | ICD-10-CM | POA: Diagnosis not present

## 2023-01-10 DIAGNOSIS — N3040 Irradiation cystitis without hematuria: Secondary | ICD-10-CM | POA: Diagnosis not present

## 2023-01-10 DIAGNOSIS — D62 Acute posthemorrhagic anemia: Secondary | ICD-10-CM | POA: Diagnosis not present

## 2023-01-10 DIAGNOSIS — I10 Essential (primary) hypertension: Secondary | ICD-10-CM | POA: Diagnosis not present

## 2023-01-10 LAB — COMPREHENSIVE METABOLIC PANEL
ALT: 39 U/L (ref 0–44)
AST: 35 U/L (ref 15–41)
Albumin: 2.6 g/dL — ABNORMAL LOW (ref 3.5–5.0)
Alkaline Phosphatase: 58 U/L (ref 38–126)
Anion gap: 8 (ref 5–15)
BUN: 18 mg/dL (ref 8–23)
CO2: 21 mmol/L — ABNORMAL LOW (ref 22–32)
Calcium: 8.5 mg/dL — ABNORMAL LOW (ref 8.9–10.3)
Chloride: 107 mmol/L (ref 98–111)
Creatinine, Ser: 1.12 mg/dL (ref 0.61–1.24)
GFR, Estimated: >60 mL/min (ref >60)
Glucose, Bld: 172 mg/dL — ABNORMAL HIGH (ref 70–99)
Potassium: 3.8 mmol/L (ref 3.5–5.1)
Sodium: 136 mmol/L (ref 135–145)
Total Bilirubin: 0.5 mg/dL (ref 0.3–1.2)
Total Protein: 5.6 g/dL — ABNORMAL LOW (ref 6.5–8.1)

## 2023-01-10 LAB — URINE CULTURE

## 2023-01-10 LAB — CBC WITH DIFFERENTIAL/PLATELET
Abs Immature Granulocytes: 0.2 10*3/uL — ABNORMAL HIGH (ref 0.00–0.07)
Basophils Absolute: 0 10*3/uL (ref 0.0–0.1)
Basophils Relative: 0 %
Eosinophils Absolute: 0 10*3/uL (ref 0.0–0.5)
Eosinophils Relative: 0 %
HCT: 22.9 % — ABNORMAL LOW (ref 39.0–52.0)
Hemoglobin: 7.5 g/dL — ABNORMAL LOW (ref 13.0–17.0)
Immature Granulocytes: 2 %
Lymphocytes Relative: 8 %
Lymphs Abs: 0.8 10*3/uL (ref 0.7–4.0)
MCH: 30 pg (ref 26.0–34.0)
MCHC: 32.8 g/dL (ref 30.0–36.0)
MCV: 91.6 fL (ref 80.0–100.0)
Monocytes Absolute: 0.7 10*3/uL (ref 0.1–1.0)
Monocytes Relative: 7 %
Neutro Abs: 7.9 10*3/uL — ABNORMAL HIGH (ref 1.7–7.7)
Neutrophils Relative %: 83 %
Platelets: 216 10*3/uL (ref 150–400)
RBC: 2.5 MIL/uL — ABNORMAL LOW (ref 4.22–5.81)
RDW: 15.7 % — ABNORMAL HIGH (ref 11.5–15.5)
WBC: 9.5 10*3/uL (ref 4.0–10.5)
nRBC: 0 % (ref 0.0–0.2)

## 2023-01-10 LAB — GLUCOSE, CAPILLARY
Glucose-Capillary: 152 mg/dL — ABNORMAL HIGH (ref 70–99)
Glucose-Capillary: 132 mg/dL — ABNORMAL HIGH (ref 70–99)
Glucose-Capillary: 105 mg/dL — ABNORMAL HIGH (ref 70–99)
Glucose-Capillary: 191 mg/dL — ABNORMAL HIGH (ref 70–99)

## 2023-01-10 LAB — PREPARE RBC (CROSSMATCH)

## 2023-01-10 LAB — MAGNESIUM: Magnesium: 2.1 mg/dL (ref 1.7–2.4)

## 2023-01-10 MED ORDER — CEFADROXIL 500 MG PO CAPS
1000.0000 mg | ORAL_CAPSULE | Freq: Two times a day (BID) | ORAL | 0 refills | Status: AC
  Filled 2023-01-10: qty 20, 5d supply, fill #0

## 2023-01-10 MED ORDER — SIMETHICONE 80 MG PO CHEW
80.0000 mg | CHEWABLE_TABLET | Freq: Four times a day (QID) | ORAL | Status: AC | PRN
Start: 2023-01-10 — End: 2023-01-11
  Administered 2023-01-10 – 2023-01-11 (×2): 80 mg via ORAL
  Filled 2023-01-10 (×2): qty 1

## 2023-01-10 MED ORDER — SODIUM CHLORIDE 0.9% IV SOLUTION: Freq: Once | INTRAVENOUS | Status: AC

## 2023-01-10 MED ORDER — CEFADROXIL 500 MG PO CAPS
1000.0000 mg | ORAL_CAPSULE | Freq: Two times a day (BID) | ORAL | Status: DC
  Administered 2023-01-11: 1000 mg via ORAL
  Filled 2023-01-10: qty 2

## 2023-01-10 MED ORDER — FUROSEMIDE 10 MG/ML IJ SOLN
20.0000 mg | Freq: Once | INTRAMUSCULAR | Status: AC
  Administered 2023-01-10: 20 mg via INTRAVENOUS
  Filled 2023-01-10: qty 2

## 2023-01-10 NOTE — Progress Notes (Signed)
Physical Therapy Treatment Patient Details Name: [name] MRN: [id number] DOB: Jan 29, 1936 Today's Date: 01/10/2023   History of Present Illness Patient is a 87 year old male who presented  generalized weakness and fall with complaints of gross hematuria ongoing for approximately 2 weeks. Patient was admitted with acute blood loss anemia. Pt s/p cystoscopy with clot evacuation and fulguration 10/22. PMH including but not limited to:  DM2, HTN, HLD, CHF, CAD/CABG/stent, carotid artery disease s/p CEA, bioprosthetic mitral valve, A-flutter on aspirin only, stroke, GERD, esophageal stricture, depression, chronic anemia, OSA, spinal stenosis, nephrolithiasis, prostate cancer s/p radiation and TURP 2017L shoulder surgery, prostate cancer, cervical disc surgery.    PT Comments  Pt agreeable to therapy, motivated to amb. Pt needing increased time and effort to come to sitting EOB, therapist managing lines for safety. Pt powers to stand with supv using RW, amb 250 ft with RW and supv, good steadiness able to clear past obstacles, needing increased time and steps with turns. Pt denies dizziness, lightheadedness with mobility, only reporting pain. Upon return to room, pt requests to return to supine. Continue to rec HHPT at d/c.   If plan is discharge home, recommend the following: A little help with walking and/or transfers;A little help with bathing/dressing/bathroom;Help with stairs or ramp for entrance;Assist for transportation;Assistance with cooking/housework   Can travel by private vehicle        Equipment Recommendations  None recommended by PT    Recommendations for Other Services       Precautions / Restrictions Precautions Precautions: Fall Restrictions Weight Bearing Restrictions: No     Mobility  Bed Mobility Overal bed mobility: Needs Assistance Bed Mobility: Supine to Sit, Sit to Supine     Supine to sit: Contact guard, HOB elevated, Used rails Sit to supine: Min  assist   General bed mobility comments: increasead time and effort using bedrail and elevated HOB to come to sittnig EOB; min A to lift BLE back into bed    Transfers Overall transfer level: Needs assistance Equipment used: Rolling walker (2 wheels) Transfers: Sit to/from Stand Sit to Stand: Supervision           General transfer comment: good steadiness with powering to stand    Ambulation/Gait Ambulation/Gait assistance: Supervision Gait Distance (Feet): 250 Feet Assistive device: Rolling walker (2 wheels) Gait Pattern/deviations: Step-through pattern, Decreased stride length, Trunk flexed Gait velocity: decreased     General Gait Details: step through gait pattern, trunk slightly flexed, increased time and steps with turns, able to clear around obstacles in hallway without overt LOB, no increase in pain with ambulation   Stairs             Wheelchair Mobility     Tilt Bed    Modified Rankin (Stroke Patients Only)       Balance Overall balance assessment: Mild deficits observed, not formally tested                                          Cognition Arousal: Alert Behavior During Therapy: [facility] for tasks assessed/performed Overall Cognitive Status: Within Functional Limits for tasks assessed                                          Exercises  General Comments        Pertinent Vitals/Pain Pain Assessment Pain Assessment: Faces Faces Pain Scale: Hurts whole lot Pain Location: penis Pain Descriptors / Indicators: Grimacing, Guarding, Discomfort Pain Intervention(s): Limited activity within patient's tolerance, Monitored during session, Repositioned    Home Living                          Prior Function            PT Goals (current goals can now be found in the care plan section) Acute Rehab PT Goals Patient Stated Goal: Feel stronger and maintain independence PT Goal Formulation: With  patient/family Time For Goal Achievement: 01/22/23 Potential to Achieve Goals: Good Progress towards PT goals: Progressing toward goals    Frequency    Min 1X/week      PT Plan      Co-evaluation              AM-PAC PT "6 Clicks" Mobility   Outcome Measure  Help needed turning from your back to your side while in a flat bed without using bedrails?: A Little Help needed moving from lying on your back to sitting on the side of a flat bed without using bedrails?: A Little Help needed moving to and from a bed to a chair (including a wheelchair)?: A Little Help needed standing up from a chair using your arms (e.g., wheelchair or bedside chair)?: A Little Help needed to walk in hospital room?: A Little Help needed climbing 3-5 steps with a railing? : A Lot 6 Click Score: 17    End of Session Equipment Utilized During Treatment: Gait belt Activity Tolerance: Patient tolerated treatment well Patient left: in bed;with call bell/phone within reach;with bed alarm set Nurse Communication: Mobility status PT Visit Diagnosis: Unsteadiness on feet (R26.81);Muscle weakness (generalized) (M62.81)     Time: 1324-4010 PT Time Calculation (min) (ACUTE ONLY): 19 min  Charges:    $Gait Training: 8-22 mins PT General Charges $$ ACUTE PT VISIT: 1 Visit                     Tori Zaylia Riolo PT, DPT 01/10/23, 11:12 AM

## 2023-01-10 NOTE — Evaluation (Signed)
Occupational Therapy Evaluation Patient Details Name: [name] MRN: [id number] DOB: [birth year] Today's Date: 01/10/2023   History of Present Illness Patient is a 87 year old male who presented  with generalized weakness and fall with complaints of gross hematuria ongoing for approximately 2 weeks. Patient was admitted with acute blood loss anemia. Pt s/p cystoscopy with clot evacuation and fulguration 01/09/23. PMH including but not limited to:  DM2, HTN, HLD, CHF, CAD/CABG/stent, carotid artery disease s/p CEA, bioprosthetic mitral valve, A-flutter, stroke, GERD, esophageal stricture, depression, chronic anemia, OSA, spinal stenosis, nephrolithiasis, prostate cancer s/p radiation and TURP 2017, L shoulder surgery, prostate cancer, cervical disc surgery.   Clinical Impression   The pt is currently presenting slightly below his baseline level of functioning for ADL management, as she is limited by the below listed deficits (see OT problem list). At current, he appears to be with slight deconditioning and mild generalized weakness. As such, he requires SBA to CGA for most tasks, including lower body dressing, sit to stand, simulated toileting, and ambulating in his room using a RW. He will benefit from OT services in the acute setting to maximize his independence with ADLs & to facilitate his safe return home. OT anticipates he will not require post-acute care therapy services.        If plan is discharge home, recommend the following: Assistance with cooking/housework;Help with stairs or ramp for entrance    Functional Status Assessment  Patient has had a recent decline in their functional status and demonstrates the ability to make significant improvements in function in a reasonable and predictable amount of time.  Equipment Recommendations  None recommended by OT    Recommendations for Other Services       Precautions / Restrictions Precautions Precautions:  Fall Restrictions Weight Bearing Restrictions: No      Mobility Bed Mobility Overal bed mobility: Needs Assistance Bed Mobility: Supine to Sit, Sit to Supine     Supine to sit: Supervision, HOB elevated, Used rails Sit to supine: Supervision        Transfers Overall transfer level: Needs assistance Equipment used: Rolling walker (2 wheels) Transfers: Sit to/from Stand Sit to Stand: Supervision                  Balance Overall balance assessment: Mild deficits observed, not formally tested             ADL either performed or assessed with clinical judgement   ADL Overall ADL's : Needs assistance/impaired Eating/Feeding: Independent;Sitting Eating/Feeding Details (indicate cue type and reason): based on clinical judgement Grooming: Contact guard assist;Standing Grooming Details (indicate cue type and reason): at sink level, based on clinical judgement         Upper Body Dressing : Set up;Sitting   Lower Body Dressing: Contact guard assist;Sit to/from stand   Toilet Transfer: Contact guard assist;Rolling walker (2 wheels);Regular Toilet;Ambulation Toilet Transfer Details (indicate cue type and reason): at bathroom level Toileting- Clothing Manipulation and Hygiene: Contact guard assist;Sit to/from stand Toileting - Clothing Manipulation Details (indicate cue type and reason): based on clinical judgement             Vision   Additional Comments: he correctly read the time depicted on the wall clock            Pertinent Vitals/Pain Pain Assessment Pain Assessment: No/denies pain     Extremity/Trunk Assessment Upper Extremity Assessment Upper Extremity Assessment: Right hand dominant;Overall [facility] for tasks assessed   Lower Extremity Assessment  Lower Extremity Assessment: Generalized weakness       Communication Communication Communication: No apparent difficulties   Cognition Arousal: Alert Behavior During Therapy: WFL for tasks  assessed/performed Overall Cognitive Status: Within Functional Limits for tasks assessed                         Home Living Family/patient expects to be discharged to:: Private residence Living Arrangements: Spouse/significant other Available Help at Discharge: Family Type of Home: House Home Access: Stairs to enter [employer] of Steps: 4 Entrance Stairs-Rails: Right;Left Home Layout: Multi-level;Able to live on main level with bedroom/bathroom Alternate Level Stairs-Number of Steps: his bedroom and bathroom are on the main level of the home   Bathroom Shower/Tub: Walk-in shower (with grab bar)         Home Equipment: Rolling Walker (2 wheels);Cane - single point          Prior Functioning/Environment Prior Level of Function : Independent/Modified Independent;Driving             Mobility Comments: Occasional use of RW inside; cane used outside the home. Recent falls.  ADLs Comments: independent with ADLs and driving; his spouse performed most of the cooking and cleaning        OT Problem List: Decreased strength;Impaired balance (sitting and/or standing)      OT Treatment/Interventions: Self-care/ADL training;Therapeutic exercise;Energy conservation;DME and/or AE instruction;Therapeutic activities;Patient/family education;Balance training    OT Goals(Current goals can be found in the care plan section) Acute Rehab OT Goals OT Goal Formulation: With patient Time For Goal Achievement: 01/24/23 Potential to Achieve Goals: Good ADL Goals Pt Will Perform Grooming: with modified independence;standing Pt Will Perform Lower Body Dressing: with modified independence;sit to/from stand Pt Will Transfer to Toilet: with modified independence;ambulating Pt Will Perform Toileting - Clothing Manipulation and hygiene: with modified independence;sit to/from stand  OT Frequency: Min 1X/week       AM-PAC OT "6 Clicks" Daily Activity     Outcome Measure Help  from another person eating meals?: None Help from another person taking care of personal grooming?: A Little Help from another person toileting, which includes using toliet, bedpan, or urinal?: A Little Help from another person bathing (including washing, rinsing, drying)?: A Little Help from another person to put on and taking off regular upper body clothing?: None Help from another person to put on and taking off regular lower body clothing?: A Little 6 Click Score: 20   End of Session Equipment Utilized During Treatment: Rolling walker (2 wheels) Nurse Communication: Mobility status  Activity Tolerance: Patient tolerated treatment well Patient left: in bed;with call bell/phone within reach;with bed alarm set  OT Visit Diagnosis: Unsteadiness on feet (R26.81);Muscle weakness (generalized) (M62.81)                Time: 4010-2725 OT Time Calculation (min): 15 min Charges:  OT General Charges $OT Visit: 1 Visit    Reuben Likes, OTR/L 01/10/2023, 5:40 PM

## 2023-01-10 NOTE — Progress Notes (Signed)
Pt had a five beat run of V-tach at 0730 this shift. Pt remained asymptomatic throughout and is in no acute distress. MD notified

## 2023-01-10 NOTE — TOC Initial Note (Signed)
Transition of Care [facility]) - Initial/Assessment Note    Patient Details  Name: [name] MRN: [id number] Date of Birth: [birth year]  Transition of Care [facility]) CM/SW Contact:    Lanier Clam, RN Phone Number: 01/10/2023, 2:21 PM  Clinical Narrative:d/c plan home.                   Expected Discharge Plan: Home/Self Care Barriers to Discharge: Continued Medical Work up   Patient Goals and CMS Choice Patient states their goals for this hospitalization and ongoing recovery are:: Home CMS Medicare.gov Compare Post Acute Care list provided to:: Patient Choice offered to / list presented to : Patient [place] ownership interest in [facility]provided to:: Patient    Expected Discharge Plan and Services   Discharge Planning Services: CM Consult Post Acute Care Choice: Resumption of Svcs/PTA Provider Living arrangements for the past 2 months: Single Family Home                                      Prior Living Arrangements/Services Living arrangements for the past 2 months: Single Family Home Lives with:: Spouse Patient language and need for interpreter reviewed:: Yes Do you feel safe going back to the place where you live?: Yes      Need for Family Participation in Patient Care: Yes (Comment) Care giver support system in place?: Yes (comment)   Criminal Activity/Legal Involvement Pertinent to Current Situation/Hospitalization: No - Comment as needed  Activities of Daily Living   ADL Screening (condition at time of admission) Independently performs ADLs?: Yes (appropriate for developmental age) Is the patient deaf or have difficulty hearing?: No Does the patient have difficulty seeing, even when wearing glasses/contacts?: No Does the patient have difficulty concentrating, remembering, or making decisions?: No  Permission Sought/Granted Permission sought to share information with : Case Manager Permission granted to share information with : Yes,  Verbal Permission Granted  Share Information with NAME: Case manager           Emotional Assessment Appearance:: Appears stated age Attitude/Demeanor/Rapport: Gracious Affect (typically observed): Accepting Orientation: : Oriented to Self, Oriented to Place, Oriented to  Time, Oriented to Situation Alcohol / Substance Use: Not Applicable Psych Involvement: No (comment)  Admission diagnosis:  Hemorrhagic cystitis [N30.91] Iron deficiency anemia due to chronic blood loss [D50.0] Acute blood loss anemia (ABLA) [D62] Patient Active Problem List   Diagnosis Date Noted   Iron deficiency anemia 01/09/2023   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   AKI (acute kidney injury) (HCC) 01/08/2023   Hematuria 01/07/2023   Acute cystitis with hematuria 01/07/2023   Acute blood loss anemia (ABLA) 01/07/2023   Gross hematuria 09/17/2022   Severe sepsis (HCC) 07/25/2022   Generalized weakness 07/25/2022   Acute hypoxic respiratory failure (HCC) 07/25/2022   Elevated troponin 07/25/2022   Acute on chronic anemia 07/25/2022   Hyperlipidemia 07/25/2022   Depression 07/25/2022   Acute bronchitis 07/24/2022   Chronic heart failure with preserved ejection fraction (HCC) 04/23/2019   Essential tremor 08/23/2017   Peripheral arterial disease (HCC) 04/28/2016   Acute urinary retention 04/26/2015   Prostate cancer (HCC)    Controlled type 2 diabetes mellitus with hyperglycemia (HCC) 08/21/2014   Cerebral amyloid angiopathy (HCC) 08/21/2014   Hyperlipidemia with target low density lipoprotein (LDL) cholesterol less than 70 mg/dL 78/29/5621   History of mitral valve repair 05/18/2012   S/P aortic valve replacement with bioprosthetic  valve 05/18/2012   Coronary artery disease 05/18/2012   Stroke (HCC) 05/18/2012   Essential hypertension 05/10/2012   Cerebral infarction (HCC) 05/09/2012   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 [place] [address] Phone: [phone] Fax: [phone]  [facility] [address] [place] [address] Phone: [phone] Fax: [phone]     Social Determinants of Health (SDOH) Social History: SDOH Screenings   Food Insecurity: No Food Insecurity (01/07/2023)  Housing: Low Risk  (01/07/2023)  Transportation Needs: No Transportation Needs (01/07/2023)  Utilities: Not At Risk (01/07/2023)  Tobacco Use: Medium Risk (01/09/2023)   SDOH Interventions:     Readmission Risk Interventions    09/18/2022    2:40 PM 07/27/2022    2:50 PM  Readmission Risk Prevention Plan  Transportation Screening Complete Complete  PCP or Specialist Appt within 5-7 Days  Complete  PCP or Specialist Appt within 3-5 Days Complete   Home Care Screening  Complete  Medication Review (RN CM)  Complete  HRI or Home Care Consult Complete   Social Work Consult for Recovery Care Planning/Counseling Complete   Palliative Care Screening Not Applicable   Medication Review Oceanographer) Complete

## 2023-01-10 NOTE — Progress Notes (Signed)
PROGRESS NOTE    [name]  VQQ:[id number] DOB: [birth year] DOA: 01/07/2023 PCP: [name] Dills, MD  Subjective: Pt seen and examined.  Met with pt and his wife and other family relative at bedside He has cysto yesterday with cauterization of bleeding sites CBI clamped today. HgB down to 7.5 g/dl. Family asking about pt getting PRBC transfusion that urology APP mentioned to them. No mention of this in his progress note for today.  Pt able to walk 250 feet. Pt will go home with foley catheter.   Hospital Course: HPI: This is a 87 year old male with past medical history of DM2, HTN, HLD, CHF, CAD/CABG/stent, carotid artery disease s/p CEA, bioprosthetic mitral valve, A-flutter on aspirin only,, stroke, GERD, esophageal stricture, depression, chronic anemia, OSA, spinal stenosis, nephrolithiasis, prostate cancer s/p radiation and TURP 2017.  Patient self caths 3 times daily.  He presents with complaints of gross hematuria ongoing for approximately 2 weeks.  He states he has bright red blood as well as lots of clots.  He denies fever but endorses chills.  He denies nausea or vomiting but endorses mild right-sided flank discomfort every now and then.  He states he been getting weaker and has fallen twice Tuesday and Thursday.  He did hit his head but denies neck pain.  He states he is not lightheaded or dizzy just weak.  He denies chest pains.  He lives at home with his wife and uses a walker at baseline.     In the ER patient is hemodynamically stable.  Foley placed has return of gross hematuria.  Hemoglobin 6.4.  Platelets normal.  INR 1.2.  UA hemoglobin larger, leukocyte small ketones negative, bacteria few.  Urine culture, blood cultures ordered blood transfusion ordered, anemia panel ordered.  Urology consult placed by epic by EDP. Significant Events: Admitted 01/07/2023   Significant Labs: Admitting HgB 6.4, BUN 29, Scr 1.65  Significant Imaging Studies:   Antibiotic  Therapy: Anti-infectives (From admission, onward)    Start     Dose/Rate Route Frequency Ordered Stop   01/08/23 1000  cefTRIAXone (ROCEPHIN) 2 g in sodium chloride 0.9 % 100 mL IVPB        2 g 200 mL/hr over 30 Minutes Intravenous Every 24 hours 01/07/23 2044     01/07/23 1730  cefTRIAXone (ROCEPHIN) 2 g in sodium chloride 0.9 % 100 mL IVPB        2 g 200 mL/hr over 30 Minutes Intravenous  Once 01/07/23 1726 01/07/23 1936       Procedures: 01-07-2023 - insertion 3 way CBI catheter 12-28-2022 transfusion 2 units PRBC  Consultants: Urology - [facility]    Assessment and Plan: * Acute blood loss anemia (ABLA) Admitted with HgB of 6.4. transfused with 2 units PRBC on admission. 01-08-2023 HgB up to 8.5. due to gross hematuria from presumed radiation cystitis  01-10-2023 blood loss due to radiation cystitis/gross hematuria. HgB down to 7.5 today. Peak of 8.5 g/dl after 2 unit PRBC transfusion on admission. Will go ahead and transfuse 2 units PRBC as pt is symptomatic with SOB and fatigue. IV lasix 20 mg in between units of PRBC.  AKI (acute kidney injury) (HCC) Cause of AKI is multifactorial. Can include radiation cystitis, gross hematuria, Acute blood loss and UTI.  Continue to monitor Scr. Holding ARB and lasix for now. 01-09-2023. Scr down to 1.24 today. Was 1.65 yesterday. AKI now resolved. 01-11-2023  Scr 1.12 today. Resolved.  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  Urology is following.  Acute cystitis with hematuria 01-08-2023 remains on IV Rocephin 2 g daily. Awaiting urine culture 01-09-2023 urine cx growing E. Coli and Klebsiella. Awaiting final sensitivities. Continue with IV Rocephin for now. Blood cx negative thus far. 01-10-2023 final sensitivities known. Finish out IV rocephin today and change to po [employer]. Will discharge on 5 additional days of Duricef.  Gross hematuria 01-08-2023 Presumed to be from radiation cystitis.  Urology may perform cystoscopy tomorrow.  01-10-2023 had cystoscopy yesterday with fulguration of bleeding areas. Pt will need to go home with foley.  Chronic heart failure with preserved ejection fraction (HCC) Stable. Holding lasix and ARB due to AKI.  Hyperlipidemia Continue crestor 10 mg daily.  Controlled type 2 diabetes mellitus with hyperglycemia (HCC) Continue with SSI. And 70/30 mix insulin. Due to stress from hematuria and UTI.  Essential hypertension Stable. Holding lasix and ARB due to AKI  CAD (coronary artery disease) Stable. Hold ASA due to gross hematuria.  Iron deficiency anemia Iron studies iron deficiency anemia.  Iron 43(range 45-182). % saturation 15. Likely due to long term microscopic hematuria. Will need oral iron therapy at discharge. And routine monitoring of his HgB as outpatient.   DVT prophylaxis: SCDs Start: 01/07/23 2046    Code Status: Full Code Family Communication: discussed with pt and wife at bedside Disposition Plan: home Reason for continuing need for hospitalization: pt to receive 2 unit PRBC today. Plan for DC tomorrow. Will send RX for abx to [facility] outpatient pharmacy today to speed up DC process.  Objective: Vitals:   01/09/23 1958 01/10/23 0032 01/10/23 0445 01/10/23 0854  BP: (!) 134/59 127/67 130/76 (!) 128/54  Pulse: 77 73 70 68  Resp: 18 17 18 16   Temp: 97.7 F (36.5 C) 98 F (36.7 C) 98.2 F (36.8 C) 98 F (36.7 C)  TempSrc: Oral Oral Oral Oral  SpO2: 98% 99% 97% 96%  Weight:      Height:        Intake/Output Summary (Last 24 hours) at 01/10/2023 1308 Last data filed at 01/10/2023 0914 Gross per 24 hour  Intake 24401 ml  Output 20525 ml  Net -7689 ml   Filed Weights   01/07/23 1546 01/07/23 1553 01/07/23 2221  Weight: 68 kg 65.8 kg 76.5 kg    Examination:  Physical Exam Vitals and nursing note reviewed.  Constitutional:      General: He is not in acute distress.    Appearance: He is not toxic-appearing or  diaphoretic.  HENT:     Head: Normocephalic and atraumatic.     Nose: Nose normal.  Eyes:     General: No scleral icterus. Cardiovascular:     Rate and Rhythm: Normal rate and regular rhythm.     Pulses: Normal pulses.  Pulmonary:     Effort: Pulmonary effort is normal.     Breath sounds: Normal breath sounds.  Abdominal:     General: Bowel sounds are normal. There is no distension.     Palpations: Abdomen is soft.  Genitourinary:    Comments: Foley without bloody urine Musculoskeletal:     Right lower leg: No edema.     Left lower leg: No edema.  Skin:    General: Skin is warm and dry.     Capillary Refill: Capillary refill takes less than 2 seconds.  Neurological:     General: No focal deficit present.     Mental Status: He is alert and oriented to person, place, and time.  Data Reviewed: I have personally reviewed following labs and imaging studies  CBC: Recent Labs  Lab 01/07/23 1700 01/07/23 2320 01/08/23 0546 01/09/23 0710 01/10/23 0517  WBC 13.2*  --   --  13.0* 9.5  NEUTROABS 10.1*  --   --  10.3* 7.9*  HGB 6.4* 7.0* 8.5* 8.3* 7.5*  HCT 19.9* 21.9* 25.7* 25.6* 22.9*  MCV 92.6  --   --  92.8 91.6  PLT 248  --   --  216 216   Basic Metabolic Panel: Recent Labs  Lab 01/07/23 1700 01/07/23 2320 01/09/23 0710 01/10/23 0517  NA 132* 136 137 136  K 3.9 3.9 3.8 3.8  CL 104 108 107 107  CO2 19* 19* 21* 21*  GLUCOSE 216* 196* 167* 172*  BUN 29* 26* 16 18  CREATININE 1.65* 1.65* 1.24 1.12  CALCIUM 8.3* 8.0* 8.5* 8.5*  MG  --  1.8  --  2.1   GFR: Estimated Creatinine Clearance: 44.3 mL/min (by C-G formula based on SCr of 1.12 mg/dL). Liver Function Tests: Recent Labs  Lab 01/07/23 1700 01/07/23 2320 01/09/23 0710 01/10/23 0517  AST 32 45* 49* 35  ALT 25 29 41 39  ALKPHOS 57 65 62 58  BILITOT 0.6 0.6 0.7 0.5  PROT 6.9 5.8* 5.9* 5.6*  ALBUMIN 3.3* 2.9* 2.9* 2.6*   Coagulation Profile: Recent Labs  Lab 01/07/23 1700 01/07/23 2320  INR  1.2 1.3*   BNP (last 3 results) Recent Labs    07/24/22 2016  BNP 282.6*   CBG: Recent Labs  Lab 01/09/23 1435 01/09/23 1626 01/09/23 2102 01/10/23 0729 01/10/23 1136  GLUCAP 145* 170* 291* 152* 132*   Anemia Panel: Recent Labs    01/07/23 2320  VITAMINB12 253  FOLATE 18.2  FERRITIN 51  TIBC 281  IRON 43*  RETICCTPCT 2.5   Recent Results (from the past 240 hour(s))  Urine Culture     Status: Abnormal   Collection Time: 01/07/23  6:39 PM   Specimen: Urine, Catheterized  Result Value Ref Range Status   Specimen Description   Final    URINE, CATHETERIZED Performed at [facility], [address], [place], [place] [address]    Special Requests   Final    NONE Performed at [facility], [address], [place], [place] [address]    Culture (A)  Final    50,000 COLONIES/mL ESCHERICHIA COLI >=100,000 COLONIES/mL KLEBSIELLA PNEUMONIAE    Report Status 01/10/2023 FINAL  Final   Organism ID, Bacteria ESCHERICHIA COLI (A)  Final   Organism ID, Bacteria KLEBSIELLA PNEUMONIAE (A)  Final      Susceptibility   Escherichia coli - MIC*    AMPICILLIN <=2 SENSITIVE Sensitive     CEFAZOLIN <=4 SENSITIVE Sensitive     CEFEPIME <=0.12 SENSITIVE Sensitive     CEFTRIAXONE <=0.25 SENSITIVE Sensitive     CIPROFLOXACIN <=0.25 SENSITIVE Sensitive     GENTAMICIN <=1 SENSITIVE Sensitive     IMIPENEM <=0.25 SENSITIVE Sensitive     NITROFURANTOIN <=16 SENSITIVE Sensitive     TRIMETH/SULFA <=20 SENSITIVE Sensitive     AMPICILLIN/SULBACTAM <=2 SENSITIVE Sensitive     PIP/TAZO <=4 SENSITIVE Sensitive ug/mL    * 50,000 COLONIES/mL ESCHERICHIA COLI   Klebsiella pneumoniae - MIC*    AMPICILLIN >=32 RESISTANT Resistant     CEFAZOLIN <=4 SENSITIVE Sensitive     CEFEPIME <=0.12 SENSITIVE Sensitive     CEFTRIAXONE <=0.25 SENSITIVE Sensitive     CIPROFLOXACIN <=0.25 SENSITIVE  Sensitive     GENTAMICIN <=1 SENSITIVE Sensitive     IMIPENEM <=0.25  SENSITIVE Sensitive     NITROFURANTOIN 64 INTERMEDIATE Intermediate     TRIMETH/SULFA <=20 SENSITIVE Sensitive     AMPICILLIN/SULBACTAM 8 SENSITIVE Sensitive     PIP/TAZO <=4 SENSITIVE Sensitive ug/mL    * >=100,000 COLONIES/mL KLEBSIELLA PNEUMONIAE  SARS Coronavirus 2 by RT PCR (hospital order, performed in [facility] hospital lab) *cepheid single result test* Anterior Nasal Swab     Status: None   Collection Time: 01/07/23  9:42 PM   Specimen: Anterior Nasal Swab  Result Value Ref Range Status   SARS Coronavirus 2 by RT PCR NEGATIVE NEGATIVE Final    Comment: (NOTE) SARS-CoV-2 target nucleic acids are NOT DETECTED.  The SARS-CoV-2 RNA is generally detectable in upper and lower respiratory specimens during the acute phase of infection. The lowest concentration of SARS-CoV-2 viral copies this assay can detect is 250 copies / mL. A negative result does not preclude SARS-CoV-2 infection and should not be used as the sole basis for treatment or other patient management decisions.  A negative result may occur with improper specimen collection / handling, submission of specimen other than nasopharyngeal swab, presence of viral mutation(s) within the areas targeted by this assay, and inadequate number of viral copies (<250 copies / mL). A negative result must be combined with clinical observations, patient history, and epidemiological information.  Fact Sheet for Patients:   RoadLapTop.co.za  Fact Sheet for Healthcare Providers: http://kim-miller.com/  This test is not yet approved or  cleared by the [place] FDA and has been authorized for detection and/or diagnosis of SARS-CoV-2 by FDA under an Emergency Use Authorization (EUA).  This EUA will remain in effect (meaning this test can be used) for the duration of the COVID-19 declaration under Section 564(b)(1) of the Act, 21 U.S.C. section 360bbb-3(b)(1), unless the authorization is  terminated or revoked sooner.  Performed at [facility], [address] [place], [place] [address]   Culture, blood (Routine X 2) w Reflex to ID Panel     Status: None (Preliminary result)   Collection Time: 01/07/23 11:20 PM   Specimen: BLOOD  Result Value Ref Range Status   Specimen Description   Final    BLOOD BLOOD LEFT HAND AEROBIC BOTTLE ONLY Performed at [facility], [address], [place], [place] [address]    Special Requests   Final    Blood Culture adequate volume BOTTLES DRAWN AEROBIC ONLY Performed at [facility], [address], [place], [place] [address]    Culture   Final    NO GROWTH 2 DAYS Performed at [facility], [address], [place], [place] [address]    Report Status PENDING  Incomplete  Culture, blood (Routine X 2) w Reflex to ID Panel     Status: None (Preliminary result)   Collection Time: 01/07/23 11:20 PM   Specimen: BLOOD  Result Value Ref Range Status   Specimen Description   Final    BLOOD BLOOD LEFT HAND AEROBIC BOTTLE ONLY Performed at [facility], [address] [place], [place] [address]    Special Requests   Final    Blood Culture adequate volume BOTTLES DRAWN AEROBIC ONLY Performed at [facility], [address] [place], [place] [address]    Culture   Final    NO GROWTH 2 DAYS Performed at [facility], [address]  [place], [place] [address]    Report Status PENDING  Incomplete     Radiology Studies: No results found.  Scheduled Meds:  sodium chloride   Intravenous Once   sodium chloride   Intravenous Once   [START ON 01/11/2023] cefadroxil  1,000 mg Oral BID   Chlorhexidine Gluconate Cloth  6 each Topical Daily   finasteride  5 mg Oral Daily   FLUoxetine  20 mg Oral QHS   insulin aspart  0-15 Units Subcutaneous TID WC   insulin aspart  0-5 Units Subcutaneous QHS   insulin aspart protamine- aspart  14  Units Subcutaneous BID WC   melatonin  5 mg Oral QHS   rosuvastatin  10 mg Oral BH-[id number]   tamsulosin  0.4 mg Oral Daily   Continuous Infusions:  sodium chloride irrigation       LOS: 3 days   Time spent: 40 minutes  Carollee Herter, DO  [facility]  01/10/2023, 1:08 PM

## 2023-01-10 NOTE — Plan of Care (Signed)
I came to reassess Mr. Raygoza midday.  His hematuria has largely resolved.  Urine in tubing was clear yellow with a little bit of sediment from old blood.  Reviewed case and plan with patient and his wife again who are in agreement.  He will discharge with the Foley catheter he has in place.  He will follow-up in clinic in 1 to 2 weeks for voiding trial.  He will continue Flomax and finasteride on discharge.  During this visit, we will decide whether or not he will require suprapubic tube temporarily to allow the necrotic/irritated area of his prostatic fossa to heal.  We will also set him up with hyperbaric oxygen treatment for the radiation cystitis on an outpatient basis.  His hemoglobin was down to 7.5 this morning and he carries a history of CAD, s/p post CABG.  Have ordered him 1 unit packed red blood cells prior to his discharge.  Our schedulers will contact him today.  Please call with questions.

## 2023-01-10 NOTE — Progress Notes (Signed)
   1 Day Post-Op Subjective: No acute events overnight.  Patient reports an incidence of clot obstruction.  CBI running slow with minimal hematuria.  Clamped this morning.  Objective: Vital signs in last 24 hours: Temp:  [97.1 F (36.2 C)-98.2 F (36.8 C)] 98 F (36.7 C) (10/23 0854) Pulse Rate:  [63-77] 68 (10/23 0854) Resp:  [14-19] 16 (10/23 0854) BP: (117-162)/(54-81) 128/54 (10/23 0854) SpO2:  [95 %-100 %] 96 % (10/23 0854)  Assessment/Plan: 87 year old male with Hx of prostate cancer-s/p XRT and ADT in 2017, radiation cystitis, TURP, CIC 3 times daily, now with hematuria.  #Radiation cystitis vs. rebleed at prostatic fossa #Hematuria  Trend H&H.  2u PRBC 10/20. HgB 6.4-->7.0-->8.5-->8.3-->7.5  Continue three-way Foley catheter.  CBI clamped this morning  S/p TXA x 2 10/21  Cystoscopy with fulguration w/ Dr. Arita Miss 10/23.  Necrotic prostatic urethral tissue with sloughing and bleeding was noted during cystoscopy.  Again changes of bladder mucosa consistent with radiation cystitis was noted.  These areas were fulgurated.  Patient was placed back on CBI return to the floor.  Patient will discharge home with the Foley he has in place.  Will plan to see him for trial of void in the clinic in around 1 week and arrange hyperbaric oxygen treatment for his radiation cystitis at that time.  #bladder spasm PRN Hycosyamine  Intake/Output from previous day: 10/22 0701 - 10/23 0700 In: [address] [I.V.:516; IV Piggyback:100] Out: [address] [Urine:23[address]]  Intake/Output this shift: No intake/output data recorded.  Physical Exam:  General: Alert and oriented CV: No cyanosis Lungs: equal chest rise Gu: Three-way catheter in place.  CBI clamped  Lab Results: Recent Labs    01/08/23 0546 01/09/23 0710 01/10/23 0517  HGB 8.5* 8.3* 7.5*  HCT 25.7* 25.6* 22.9*   BMET Recent Labs    01/09/23 0710 01/10/23 0517  NA 137 136  K 3.8 3.8  CL 107 107  CO2 21* 21*  GLUCOSE 167* 172*   BUN 16 18  CREATININE 1.24 1.12  CALCIUM 8.5* 8.5*     Studies/Results: No results found.    LOS: 3 days   [name] Kirschner, NP [facility] Pager: [phone]  01/10/2023, 8:56 AM

## 2023-01-11 ENCOUNTER — Other Ambulatory Visit (HOSPITAL_COMMUNITY): Payer: Self-pay

## 2023-01-11 DIAGNOSIS — D62 Acute posthemorrhagic anemia: Secondary | ICD-10-CM | POA: Diagnosis not present

## 2023-01-11 LAB — CBC WITH DIFFERENTIAL/PLATELET
Abs Immature Granulocytes: 0.4 10*3/uL — ABNORMAL HIGH (ref 0.00–0.07)
Basophils Absolute: 0.1 10*3/uL (ref 0.0–0.1)
Basophils Relative: 1 %
Eosinophils Absolute: 0.4 10*3/uL (ref 0.0–0.5)
Eosinophils Relative: 4 %
HCT: 32 % — ABNORMAL LOW (ref 39.0–52.0)
Hemoglobin: 10.6 g/dL — ABNORMAL LOW (ref 13.0–17.0)
Immature Granulocytes: 3 %
Lymphocytes Relative: 10 %
Lymphs Abs: 1.1 10*3/uL (ref 0.7–4.0)
MCH: 29.2 pg (ref 26.0–34.0)
MCHC: 33.1 g/dL (ref 30.0–36.0)
MCV: 88.2 fL (ref 80.0–100.0)
Monocytes Absolute: 0.9 10*3/uL (ref 0.1–1.0)
Monocytes Relative: 8 %
Neutro Abs: 9.1 10*3/uL — ABNORMAL HIGH (ref 1.7–7.7)
Neutrophils Relative %: 74 %
Platelets: 282 10*3/uL (ref 150–400)
RBC: 3.63 MIL/uL — ABNORMAL LOW (ref 4.22–5.81)
RDW: 18.2 % — ABNORMAL HIGH (ref 11.5–15.5)
WBC: 12 10*3/uL — ABNORMAL HIGH (ref 4.0–10.5)
nRBC: 0 % (ref 0.0–0.2)

## 2023-01-11 LAB — TYPE AND SCREEN
ABO/RH(D): A POS
Antibody Screen: NEGATIVE
Unit division: 0
Unit division: 0
Unit division: 0
Unit division: 0

## 2023-01-11 LAB — BPAM RBC
Blood Product Expiration Date: 202411102359
Blood Product Expiration Date: 202411102359
Blood Product Expiration Date: 202411102359
Blood Product Unit Number: 202411102359
ISSUE DATE / TIME: 202410201848
ISSUE DATE / TIME: 202410202345
ISSUE DATE / TIME: 202410231832
PRODUCT CODE: 202410231523
PRODUCT CODE: 202411102359
Unit Type and Rh: 202411102359
Unit Type and Rh: 6200
Unit Type and Rh: 6200
Unit Type and Rh: 6200
Unit Type and Rh: 6200
Unit Type and Rh: 6200

## 2023-01-11 LAB — COMPREHENSIVE METABOLIC PANEL
ALT: 41 U/L (ref 0–44)
AST: 37 U/L (ref 15–41)
Albumin: 2.8 g/dL — ABNORMAL LOW (ref 3.5–5.0)
Alkaline Phosphatase: 63 U/L (ref 38–126)
Anion gap: 9 (ref 5–15)
BUN: 20 mg/dL (ref 8–23)
CO2: 26 mmol/L (ref 22–32)
Calcium: 8.8 mg/dL — ABNORMAL LOW (ref 8.9–10.3)
Chloride: 104 mmol/L (ref 98–111)
Creatinine, Ser: 1.16 mg/dL (ref 0.61–1.24)
GFR, Estimated: >60 mL/min (ref >60)
Glucose, Bld: 134 mg/dL — ABNORMAL HIGH (ref 70–99)
Potassium: 3.8 mmol/L (ref 3.5–5.1)
Sodium: 139 mmol/L (ref 135–145)
Total Bilirubin: 0.6 mg/dL (ref 0.3–1.2)
Total Protein: 5.9 g/dL — ABNORMAL LOW (ref 6.5–8.1)

## 2023-01-11 LAB — GLUCOSE, CAPILLARY: Glucose-Capillary: 128 mg/dL — ABNORMAL HIGH (ref 70–99)

## 2023-01-11 MED ORDER — HYOSCYAMINE SULFATE 0.125 MG SL SUBL
0.2500 mg | SUBLINGUAL_TABLET | SUBLINGUAL | 0 refills | Status: DC | PRN
  Filled 2023-01-11: qty 30, 3d supply, fill #0

## 2023-01-11 MED ORDER — POLYETHYLENE GLYCOL 3350 17 G PO PACK: 17.0000 g | PACK | Freq: Two times a day (BID) | ORAL | Status: DC

## 2023-01-11 MED ORDER — SENNOSIDES-DOCUSATE SODIUM 8.6-50 MG PO TABS: 1.0000 | ORAL_TABLET | Freq: Two times a day (BID) | ORAL | Status: DC

## 2023-01-11 NOTE — Progress Notes (Signed)
AVS given to patient and explained at the bedside. Medications and follow up appointments have been explained with pt verbalizing understanding. Foley care teaching provided at bedside with pt returning teach back.

## 2023-01-11 NOTE — TOC Transition Note (Addendum)
Transition of Care [facility]) - CM/SW Discharge Note   Patient Details  Name: [name] MRN: [id number] Date of Birth: 1936/03/14  [facility] CM/SW Contact:  Lanier Clam, RN Phone Number: 01/11/2023, 10:51 AM   Clinical Narrative:  Spoke to Katherine(spouse) about HHPT-pleasantly decline HHC services will f/u w/PCP on own if needed after d/c. F/c to be managed by urology. No further CM needs.     Final next level of care: Home/Self Care Barriers to Discharge: No Barriers Identified   Patient Goals and CMS Choice CMS Medicare.gov Compare Post Acute Care list provided to:: Patient Choice offered to / list presented to : Patient  Discharge Placement                         Discharge Plan and Services Additional resources added to the After Visit Summary for     Discharge Planning Services: CM Consult Post Acute Care Choice: Resumption of Svcs/PTA Provider                    HH Arranged: Patient Refused [facility]          Social Determinants of Health (SDOH) Interventions SDOH Screenings   Food Insecurity: No Food Insecurity (01/07/2023)  Housing: Low Risk  (01/07/2023)  Transportation Needs: No Transportation Needs (01/07/2023)  Utilities: Not At Risk (01/07/2023)  Tobacco Use: Medium Risk (01/09/2023)     Readmission Risk Interventions    01/10/2023    2:22 PM 09/18/2022    2:40 PM 07/27/2022    2:50 PM  Readmission Risk Prevention Plan  Transportation Screening Complete Complete Complete  PCP or Specialist Appt within 5-7 Days   Complete  PCP or Specialist Appt within 3-5 Days  Complete   Home Care Screening   Complete  Medication Review (RN CM)   Complete  HRI or Home Care Consult  Complete   Social Work Consult for Recovery Care Planning/Counseling  Complete   Palliative Care Screening  Not Applicable   Medication Review Oceanographer) Complete Complete   PCP or Specialist appointment within 3-5 days of discharge Complete    HRI  or Home Care Consult Complete    SW Recovery Care/Counseling Consult Complete    Palliative Care Screening Not Applicable    Skilled Nursing Facility Not Applicable

## 2023-01-11 NOTE — Consult Note (Signed)
[facility]    01/11/2023  [name] Jul 13, 1935 [id number]  [facility]Based [facility] Institute [VBCI]   Primary Care Provider: Renford Dills, MD with Deboraha Sprang at [facility], this provider is listed for the transition of care follow up appointments  and calls   [facility] Liaison screened the patient remotely at [facility].    The patient was screened for  day readmission hospitalization with noted extreme risk score for unplanned readmission risk 3 ED visits and 3 hospital admissions in 6 months.  The patient was assessed for potential Community Care Coordination service needs for post hospital transition for care coordination. Review of patient's electronic medical record reveals patient is home and declined HH, as recommended by OT, no PT recommendations.   Plan: Patient is to have follow up with [facility] provider and the [facility] is listed to follow up.   VBCI Community Care Management/Population Health does not replace or interfere with any arrangements made by the Inpatient Transition of Care team.   For questions contact:   Charlesetta Shanks, RN, BSN, CCM [place]  [facility], [facility] Liaison Direct Dial: [phone] or secure chat Website: [name]

## 2023-01-11 NOTE — Progress Notes (Signed)
   2 Days Post-Op Subjective: No acute events overnight.  Hematuria is still present but minimal  Objective: Vital signs in last 24 hours: Temp:  [97.6 F (36.4 C)-99.3 F (37.4 C)] 97.8 F (36.6 C) (10/24 0343) Pulse Rate:  [68-92] 76 (10/24 0343) Resp:  [14-18] 17 (10/24 0343) BP: (113-153)/(49-77) 153/66 (10/24 0343) SpO2:  [96 %-100 %] 97 % (10/24 0343)  Assessment/Plan: 87 year old male with Hx of prostate cancer-s/p XRT and ADT in 2017, radiation cystitis, TURP, CIC 3 times daily, now with hematuria.  #Radiation cystitis vs. rebleed at prostatic fossa #Hematuria #ABLA  Trend H&H.  2u PRBC 10/20. HgB 6.4-->7.0-->8.5-->8.3-->7.5-->(2u PRBC) 10.5  Iron supplementation on discharge per primary  Continue three-way Foley catheter.  Urine clear lightly blood tinged with small amount of sediment. No clot in tubing  S/p TXA x 2 10/21  Cystoscopy with fulguration w/ Dr. Arita Miss 10/23.  Necrotic prostatic urethral tissue with sloughing and bleeding was noted during cystoscopy.  Again changes of bladder mucosa consistent with radiation cystitis was noted.  These areas were fulgurated.  CBI off since yesterday.  Patient will discharge home with the Foley he has in place.    Will plan to see him for trial of void in the clinic in around 1 week and arrange hyperbaric oxygen treatment for his radiation cystitis at that time.  #bladder spasm PRN Hycosyamine     Intake/Output from previous day: 10/23 0701 - 10/24 0700 In: 1174.8 [P.O.:440; I.V.:34.8; Blood:700] Out: 3050 [Urine:3050]  Intake/Output this shift: Total I/O In: -  Out: 700 [Urine:700]  Physical Exam:  General: Alert and oriented CV: No cyanosis Lungs: equal chest rise Gu: Three-way catheter in place.  CBI off. Clear urine, lightly blood tinged. No clots. Small amount of sediment  Lab Results: Recent Labs    01/09/23 0710 01/10/23 0517 01/11/23 0459  HGB 8.3* 7.5* 10.6*  HCT 25.6* 22.9* 32.0*    BMET Recent Labs    01/10/23 0517 01/11/23 0459  NA 136 139  K 3.8 3.8  CL 107 104  CO2 21* 26  GLUCOSE 172* 134*  BUN 18 20  CREATININE 1.12 1.16  CALCIUM 8.5* 8.8*     Studies/Results: No results found.    LOS: 4 days   Elmon Kirschner, NP [facility] Pager: [phone]  01/11/2023, 7:56 AM

## 2023-01-11 NOTE — Discharge Summary (Signed)
Physician Discharge Summary  [name]:[id number] DOB: 1936/01/10 DOA: 01/07/2023  PCP: Renford Dills, MD  Admit date: 01/07/2023 Discharge date: 01/11/2023  Admitted From: Home Disposition: Home  Recommendations for Outpatient Follow-up:  Follow up with PCP in 1-2 weeks Follow-up with urology as scheduled:  Home Health: None Equipment/Devices: None  Discharge Condition: Stable CODE STATUS: Full Diet recommendation: Low-salt low-fat low-carb diet  Brief/Interim Summary: This is a 87 year old male with past medical history of DM2, HTN, HLD, CHF, CAD/CABG/stent, carotid artery disease s/p CEA, bioprosthetic mitral valve, A-flutter on aspirin only,, stroke, GERD, esophageal stricture, depression, chronic anemia, OSA, spinal stenosis, nephrolithiasis, prostate cancer s/p radiation and TURP 2017.  Patient self caths 3 times daily.  He presents with complaints of gross hematuria ongoing for approximately 2 weeks.  He states he has bright red blood as well as lots of clots.  He denies fever but endorses chills.  He denies nausea or vomiting but endorses mild right-sided flank discomfort every now and then.  He states he been getting weaker and has fallen twice Tuesday and Thursday.  He did hit his head but denies neck pain.  He states he is not lightheaded or dizzy just weak.  He denies chest pains.  He lives at home with his wife and uses a walker at baseline.     Patient admitted as above with acute blood loss anemia secondary to profound hematuria complicated by acute cystitis and radiation cystitis.  Patient ultimately required fulguration of bleeding areas per urology and cystoscopy with multiple units of blood transfused to maintain appropriate labs.  At this time patient is otherwise stable and agreeable for discharge, Foley will remain intact per urology, outpatient follow-up with PCP and urology as scheduled.  Patient's other chronic comorbid conditions appear to be  well-controlled, resume home medications as appropriate.  Discharge Diagnoses:  Principal Problem:   Acute blood loss anemia (ABLA) Active Problems:   Gross hematuria   Acute cystitis with hematuria   Radiation cystitis   AKI (acute kidney injury) (HCC)   CAD (coronary artery disease)   Essential hypertension   Controlled type 2 diabetes mellitus with hyperglycemia (HCC)   Hyperlipidemia   Chronic heart failure with preserved ejection fraction (HCC)   Iron deficiency anemia  Acute blood loss anemia (ABLA) Gross hematuria Secondary to acute cystitis/radiation cystitis Improved with transfusion and fulguration of bleeding noted on cystoscopy  AKI (acute kidney injury) (HCC) Resolved, secondary to above   Radiation cystitis Acute cystitis with hematuria Urology following, see above  Chronic heart failure with preserved ejection fraction (HCC) Resume home medications   Hyperlipidemia Continue crestor 10 mg daily.   Controlled type 2 diabetes mellitus with hyperglycemia (HCC) Continue home insulin regimen   Essential hypertension Resume home medications  CAD (coronary artery disease) Hold aspirin due to hematuria   Iron deficiency anemia Continue oral iron supplementation  Discharge Instructions  Discharge Instructions     Call MD for:  difficulty breathing, headache or visual disturbances   Complete by: As directed    Call MD for:  extreme fatigue   Complete by: As directed    Call MD for:  hives   Complete by: As directed    Call MD for:  persistant dizziness or light-headedness   Complete by: As directed    Call MD for:  persistant nausea and vomiting   Complete by: As directed    Call MD for:  redness, tenderness, or signs of infection (pain, swelling, redness, odor or  green/yellow discharge around incision site)   Complete by: As directed    Call MD for:  severe uncontrolled pain   Complete by: As directed    Call MD for:  temperature >100.4   Complete  by: As directed    Diet - low sodium heart healthy   Complete by: As directed    Diet Carb Modified   Complete by: As directed    Discharge instructions   Complete by: As directed    1. Followup with Dr. Marlou Porch with urology as scheduled for voiding trial. You will keep your Foley catheter at discharge until seen in urology office. 2. Follow up with primary care provider in 1-2 weeks following hospital discharge.   Increase activity slowly   Complete by: As directed       Allergies as of 01/11/2023   No Known Allergies      Medication List     STOP taking these medications    aspirin EC 325 MG tablet   aspirin EC 81 MG tablet       TAKE these medications    cefadroxil 500 MG capsule Commonly known as: DURICEF Take 2 capsules (1,000 mg total) by mouth 2 (two) times daily for 5 days.   ferrous sulfate 325 (65 FE) MG tablet Take 1 tablet (325 mg total) by mouth daily with breakfast.   FLUoxetine 20 MG capsule Commonly known as: PROZAC Take 20 mg by mouth at bedtime.   furosemide 40 MG tablet Commonly known as: LASIX Take 40 mg by mouth daily. What changed: Another medication with the same name was removed. Continue taking this medication, and follow the directions you see here.   gabapentin 600 MG tablet Commonly known as: NEURONTIN Take 600 mg by mouth 2 (two) times daily.   insulin lispro protamine-lispro (75-25) 100 UNIT/ML Susp injection Commonly known as: HUMALOG 75/25 MIX Inject 16 Units into the skin 2 (two) times daily with a meal.   losartan 50 MG tablet Commonly known as: COZAAR Take 50 mg by mouth daily.   melatonin 3 MG Tabs tablet Take 1 tablet by mouth at bedtime.   metFORMIN 500 MG 24 hr tablet Commonly known as: GLUCOPHAGE-XR Take 500 mg by mouth 2 (two) times daily with a meal.   metoprolol succinate 100 MG 24 hr tablet Commonly known as: TOPROL-XL Take 100 mg by mouth at bedtime.   multivitamin tablet Take 1 tablet by mouth  daily.   nitroGLYCERIN 0.4 MG SL tablet Commonly known as: NITROSTAT Place 1 tablet (0.4 mg total) under the tongue every 5 (five) minutes as needed. For chest pain What changed: reasons to take this   rosuvastatin 10 MG tablet Commonly known as: CRESTOR Take 10 mg by mouth every morning.   tamsulosin 0.4 MG Caps capsule Commonly known as: FLOMAX Take 0.4 mg by mouth daily.   zolpidem 5 MG tablet Commonly known as: AMBIEN Take 2.5 mg by mouth at bedtime as needed for sleep.        No Known Allergies  Consultations: Urology  Procedures/Studies: CT ABDOMEN PELVIS WO CONTRAST  Result Date: 01/07/2023 CLINICAL DATA:  Acute nonlocalized abdominal pain EXAM: CT ABDOMEN AND PELVIS WITHOUT CONTRAST TECHNIQUE: Multidetector CT imaging of the abdomen and pelvis was performed following the standard protocol without IV contrast. RADIATION DOSE REDUCTION: This exam was performed according to the departmental dose-optimization program which includes automated exposure control, adjustment of the mA and/or kV according to patient size and/or use of iterative reconstruction technique.  COMPARISON:  08/18/2022 FINDINGS: Lower chest: No acute abnormality. Mild cardiomegaly. Small hiatal hernia. Hepatobiliary: No focal liver abnormality is seen. No gallstones, gallbladder wall thickening, or biliary dilatation. Pancreas: Unremarkable Spleen: Unremarkable Adrenals/Urinary Tract: The adrenal glands are unremarkable. The kidneys are normal in size and position. Mild-to-moderate bilateral perinephric stranding is present, nonspecific, but progressive since prior examination suggesting un underlying inflammatory process involving the kidneys. No hydronephrosis. Circumferential thickening of the bladder wall is again seen suggesting changes of chronic bladder outlet obstruction. Interval placement of a Foley catheter with its retaining balloon seen within the bladder lumen. Small amount of high density material  is seen dependently within the bladder lumen surrounding the Foley catheter balloon likely representing a small blood clot. Stomach/Bowel: Stomach, small bowel, and large bowel are unremarkable. Circumferential thickening of the rectum is again noted, not well characterized on this examination but similar to that noted on prior exam. Mild presacral and perirectal edema is again noted and, together, the findings suggest changes of a chronic infectious or inflammatory proctitis. No free intraperitoneal gas or fluid. Vascular/Lymphatic: Aortic atherosclerosis. No enlarged abdominal or pelvic lymph nodes. Reproductive: Brachytherapy seeds are seen posterior to the prostate gland. The prostate gland is atrophic, particular when compared to remote prior examination of 03/03/2015. Other: No abdominal wall hernia Musculoskeletal: Left L4 hemilaminectomy and L4-5 anterior lumbar fusion with instrumentation with solid incorporation of interbody bone graft. Advanced degenerative changes are seen within the lumbar spine. No acute bone abnormality. No lytic or blastic bone lesion. IMPRESSION: 1. Interval placement of a Foley catheter with its retaining balloon seen within the bladder lumen. Small amount of high density material is seen dependently within the bladder lumen surrounding the Foley catheter balloon likely representing a small blood clot. 2. Progressive mild peri-renal stranding, nonspecific, but suggesting an underlying inflammatory process involving the kidneys bilaterally. Correlation with renal function tests and possibly urinalysis and urine culture may be helpful. 3. Stable circumferential thickening of the rectum with mild presacral and perirectal edema suggesting changes of a chronic infectious or inflammatory proctitis. 4. Mild cardiomegaly. Aortic Atherosclerosis (ICD10-I70.0). Electronically Signed   By: Helyn Numbers M.D.   On: 01/07/2023 22:01   CT Cervical Spine Wo Contrast  Result Date:  01/07/2023 CLINICAL DATA:  Poly trauma, blunt EXAM: CT CERVICAL SPINE WITHOUT CONTRAST TECHNIQUE: Multidetector CT imaging of the cervical spine was performed without intravenous contrast. Multiplanar CT image reconstructions were also generated. RADIATION DOSE REDUCTION: This exam was performed according to the departmental dose-optimization program which includes automated exposure control, adjustment of the mA and/or kV according to patient size and/or use of iterative reconstruction technique. COMPARISON:  Cervical spine radiographs 08/22/2005. MRI cervical spine 07/25/2005 FINDINGS: Alignment: Slight anterior subluxation of C7 on T1, unchanged since prior study. Otherwise normal alignment. Normal alignment of the posterior elements. Skull base and vertebrae: Skull base appears intact. No vertebral compression deformities. No focal bone lesion or bone destruction. Soft tissues and spinal canal: No prevertebral soft tissue swelling. No abnormal paraspinal soft tissue mass or infiltration. Disc levels: Postoperative changes with anterior plate and screw fixation from C4 through C7 with intervertebral fusion. Fusion appears intact. Degenerative changes in the other interspace levels with disc space narrowing and osteophyte formation. Prominent degenerative changes at C1-2. Degenerative changes in the posterior facet joints. Upper chest: Lung apices are clear. Other: Vascular calcifications. IMPRESSION: 1. No change in alignment since previous studies. No acute displaced fractures identified. 2. Anterior plate and screw fixation with intervertebral fusion from C4  through C7 appears intact. 3. Degenerative changes. Electronically Signed   By: Burman Nieves M.D.   On: 01/07/2023 20:12   CT Head Wo Contrast  Result Date: 01/07/2023 CLINICAL DATA:  Poly trauma, blunt EXAM: CT HEAD WITHOUT CONTRAST TECHNIQUE: Contiguous axial images were obtained from the base of the skull through the vertex without intravenous  contrast. RADIATION DOSE REDUCTION: This exam was performed according to the departmental dose-optimization program which includes automated exposure control, adjustment of the mA and/or kV according to patient size and/or use of iterative reconstruction technique. COMPARISON:  CT head 07/24/2022.  MRI brain 04/12/2016 FINDINGS: Brain: Diffuse cerebral atrophy. Ventricular dilatation consistent with central atrophy. Low-attenuation changes in the deep white matter consistent with small vessel ischemia. Small focal area of encephalomalacia in the right post frontal gyrus suggesting old infarct. No change since prior study. No abnormal extra-axial fluid collections. No mass effect or midline shift. Gray-white matter junctions are distinct. Basal cisterns are not effaced. No acute intracranial hemorrhage. Vascular: No hyperdense vessel or unexpected calcification. Skull: Normal. Negative for fracture or focal lesion. Sinuses/Orbits: No acute finding. Other: None. IMPRESSION: No acute intracranial abnormalities. Chronic atrophy and small vessel ischemic changes. Old focal infarct on the right. Electronically Signed   By: Burman Nieves M.D.   On: 01/07/2023 20:06     Subjective: No acute issues or events overnight   Discharge Exam: Vitals:   01/10/23 2049 01/11/23 0343  BP: (!) 129/58 (!) 153/66  Pulse: 78 76  Resp: 17 17  Temp: 98.7 F (37.1 C) 97.8 F (36.6 C)  SpO2:  97%   Vitals:   01/10/23 1825 01/10/23 1852 01/10/23 2049 01/11/23 0343  BP: 139/77 (!) 126/49 (!) 129/58 (!) 153/66  Pulse: 91 92 78 76  Resp: 18 16 17 17   Temp: 99.3 F (37.4 C) 97.9 F (36.6 C) 98.7 F (37.1 C) 97.8 F (36.6 C)  TempSrc: Oral Oral Oral Oral  SpO2: 98% 97%  97%  Weight:      Height:        General: Pt is alert, awake, not in acute distress Cardiovascular: RRR, S1/S2 +, no rubs, no gallops Respiratory: CTA bilaterally, no wheezing, no rhonchi Abdominal: Soft, NT, ND, bowel sounds + Extremities: no  edema, no cyanosis GU: Foley draining clear light orange urine   The results of significant diagnostics from this hospitalization (including imaging, microbiology, ancillary and laboratory) are listed below for reference.     Microbiology: Recent Results (from the past 240 hour(s))  Urine Culture     Status: Abnormal   Collection Time: 01/07/23  6:39 PM   Specimen: Urine, Catheterized  Result Value Ref Range Status   Specimen Description   Final    URINE, CATHETERIZED Performed at [facility], [address] [place], [place] [address]    Special Requests   Final    NONE Performed at [facility], [address], [place], [place] [address]    Culture (A)  Final    50,000 COLONIES/mL ESCHERICHIA COLI >=100,000 COLONIES/mL KLEBSIELLA PNEUMONIAE    Report Status 01/10/2023 FINAL  Final   Organism ID, Bacteria ESCHERICHIA COLI (A)  Final   Organism ID, Bacteria KLEBSIELLA PNEUMONIAE (A)  Final      Susceptibility   Escherichia coli - MIC*    AMPICILLIN <=2 SENSITIVE Sensitive     CEFAZOLIN <=4 SENSITIVE Sensitive     CEFEPIME <=0.12 SENSITIVE Sensitive     CEFTRIAXONE <=0.25 SENSITIVE Sensitive  CIPROFLOXACIN <=0.25 SENSITIVE Sensitive     GENTAMICIN <=1 SENSITIVE Sensitive     IMIPENEM <=0.25 SENSITIVE Sensitive     NITROFURANTOIN <=16 SENSITIVE Sensitive     TRIMETH/SULFA <=20 SENSITIVE Sensitive     AMPICILLIN/SULBACTAM <=2 SENSITIVE Sensitive     PIP/TAZO <=4 SENSITIVE Sensitive ug/mL    * 50,000 COLONIES/mL ESCHERICHIA COLI   Klebsiella pneumoniae - MIC*    AMPICILLIN >=32 RESISTANT Resistant     CEFAZOLIN <=4 SENSITIVE Sensitive     CEFEPIME <=0.12 SENSITIVE Sensitive     CEFTRIAXONE <=0.25 SENSITIVE Sensitive     CIPROFLOXACIN <=0.25 SENSITIVE Sensitive     GENTAMICIN <=1 SENSITIVE Sensitive     IMIPENEM <=0.25 SENSITIVE Sensitive     NITROFURANTOIN 64 INTERMEDIATE Intermediate     TRIMETH/SULFA <=20 SENSITIVE Sensitive      AMPICILLIN/SULBACTAM 8 SENSITIVE Sensitive     PIP/TAZO <=4 SENSITIVE Sensitive ug/mL    * >=100,000 COLONIES/mL KLEBSIELLA PNEUMONIAE  SARS Coronavirus 2 by RT PCR (hospital order, performed in [facility] hospital lab) *cepheid single result test* Anterior Nasal Swab     Status: None   Collection Time: 01/07/23  9:42 PM   Specimen: Anterior Nasal Swab  Result Value Ref Range Status   SARS Coronavirus 2 by RT PCR NEGATIVE NEGATIVE Final    Comment: (NOTE) SARS-CoV-2 target nucleic acids are NOT DETECTED.  The SARS-CoV-2 RNA is generally detectable in upper and lower respiratory specimens during the acute phase of infection. The lowest concentration of SARS-CoV-2 viral copies this assay can detect is 250 copies / mL. A negative result does not preclude SARS-CoV-2 infection and should not be used as the sole basis for treatment or other patient management decisions.  A negative result may occur with improper specimen collection / handling, submission of specimen other than nasopharyngeal swab, presence of viral mutation(s) within the areas targeted by this assay, and inadequate number of viral copies (<250 copies / mL). A negative result must be combined with clinical observations, patient history, and epidemiological information.  Fact Sheet for Patients:   RoadLapTop.co.za  Fact Sheet for Healthcare Providers: http://kim-miller.com/  This test is not yet approved or  cleared by the [place] FDA and has been authorized for detection and/or diagnosis of SARS-CoV-2 by FDA under an Emergency Use Authorization (EUA).  This EUA will remain in effect (meaning this test can be used) for the duration of the COVID-19 declaration under Section 564(b)(1) of the Act, 21 U.S.C. section 360bbb-3(b)(1), unless the authorization is terminated or revoked sooner.  Performed at [facility], [address] [place],  [place] [address]   Culture, blood (Routine X 2) w Reflex to ID Panel     Status: None (Preliminary result)   Collection Time: 01/07/23 11:20 PM   Specimen: BLOOD  Result Value Ref Range Status   Specimen Description   Final    BLOOD BLOOD LEFT HAND AEROBIC BOTTLE ONLY Performed at [facility], [address] [place], [place] [address]    Special Requests   Final    Blood Culture adequate volume BOTTLES DRAWN AEROBIC ONLY Performed at [facility], [address] [place], [place] [address]    Culture   Final    NO GROWTH 2 DAYS Performed at [facility], [address] [place], [place] [address]    Report Status PENDING  Incomplete  Culture, blood (Routine X 2) w Reflex to ID Panel     Status: None (  Preliminary result)   Collection Time: 01/07/23 11:20 PM   Specimen: BLOOD  Result Value Ref Range Status   Specimen Description   Final    BLOOD BLOOD LEFT HAND AEROBIC BOTTLE ONLY Performed at [facility], [address] [place], [place] [address]    Special Requests   Final    Blood Culture adequate volume BOTTLES DRAWN AEROBIC ONLY Performed at [facility], [address], [place], [place] [address]    Culture   Final    NO GROWTH 2 DAYS Performed at [facility], [address], [place], [place] [address]    Report Status PENDING  Incomplete     Labs: BNP (last 3 results) Recent Labs    07/24/22 2016  BNP 282.6*   Basic Metabolic Panel: Recent Labs  Lab 01/07/23 1700 01/07/23 2320 01/09/23 0710 01/10/23 0517 01/11/23 0459  NA 132* 136 137 136 139  K 3.9 3.9 3.8 3.8 3.8  CL 104 108 107 107 104  CO2 19* 19* 21* 21* 26  GLUCOSE 216* 196* 167* 172* 134*  BUN 29* 26* 16 18 20   CREATININE 1.65* 1.65* 1.24 1.12 1.16  CALCIUM 8.3* 8.0* 8.5* 8.5* 8.8*  MG  --  1.8  --  2.1  --    Liver Function Tests: Recent Labs  Lab 01/07/23 1700 01/07/23 2320 01/09/23 0710 01/10/23 0517  01/11/23 0459  AST 32 45* 49* 35 37  ALT 25 29 41 39 41  ALKPHOS 57 65 62 58 63  BILITOT 0.6 0.6 0.7 0.5 0.6  PROT 6.9 5.8* 5.9* 5.6* 5.9*  ALBUMIN 3.3* 2.9* 2.9* 2.6* 2.8*   No results for input(s): "LIPASE", "AMYLASE" in the last 168 hours. No results for input(s): "AMMONIA" in the last 168 hours. CBC: Recent Labs  Lab 01/07/23 1700 01/07/23 2320 01/08/23 0546 01/09/23 0710 01/10/23 0517 01/11/23 0459  WBC 13.2*  --   --  13.0* 9.5 12.0*  NEUTROABS 10.1*  --   --  10.3* 7.9* 9.1*  HGB 6.4* 7.0* 8.5* 8.3* 7.5* 10.6*  HCT 19.9* 21.9* 25.7* 25.6* 22.9* 32.0*  MCV 92.6  --   --  92.8 91.6 88.2  PLT 248  --   --  216 216 282   Cardiac Enzymes: No results for input(s): "CKTOTAL", "CKMB", "CKMBINDEX", "TROPONINI" in the last 168 hours. BNP: Invalid input(s): "POCBNP" CBG: Recent Labs  Lab 01/10/23 0729 01/10/23 1136 01/10/23 1626 01/10/23 2057 01/11/23 0712  GLUCAP 152* 132* 105* 191* 128*   D-Dimer No results for input(s): "DDIMER" in the last 72 hours. Hgb A1c No results for input(s): "HGBA1C" in the last 72 hours. Lipid Profile No results for input(s): "CHOL", "HDL", "LDLCALC", "TRIG", "CHOLHDL", "LDLDIRECT" in the last 72 hours. Thyroid function studies No results for input(s): "TSH", "T4TOTAL", "T3FREE", "THYROIDAB" in the last 72 hours.  Invalid input(s): "FREET3" Anemia work up No results for input(s): "VITAMINB12", "FOLATE", "FERRITIN", "TIBC", "IRON", "RETICCTPCT" in the last 72 hours. Urinalysis    Component Value Date/Time   COLORURINE BROWN (A) 01/07/2023 1839   APPEARANCEUR CLOUDY (A) 01/07/2023 1839   LABSPEC 1.015 01/07/2023 1839   LABSPEC 1.010 12/02/2015 1119   PHURINE 6.0 01/07/2023 1839   GLUCOSEU 50 (A) 01/07/2023 1839   GLUCOSEU 1,000 12/02/2015 1119   HGBUR LARGE (A) 01/07/2023 1839   BILIRUBINUR NEGATIVE 01/07/2023 1839   BILIRUBINUR Negative 12/02/2015 1119   KETONESUR NEGATIVE 01/07/2023 1839   PROTEINUR 100 (A) 01/07/2023 1839    UROBILINOGEN  0.2 12/02/2015 1119   NITRITE NEGATIVE 01/07/2023 1839   LEUKOCYTESUR SMALL (A) 01/07/2023 1839   LEUKOCYTESUR Negative 12/02/2015 1119   Sepsis Labs Recent Labs  Lab 01/07/23 1700 01/09/23 0710 01/10/23 0517 01/11/23 0459  WBC 13.2* 13.0* 9.5 12.0*   Microbiology Recent Results (from the past 240 hour(s))  Urine Culture     Status: Abnormal   Collection Time: 01/07/23  6:39 PM   Specimen: Urine, Catheterized  Result Value Ref Range Status   Specimen Description   Final    URINE, CATHETERIZED Performed at [facility], [address], [place], [place] [address]    Special Requests   Final    NONE Performed at [facility], [address] [place], [place] [address]    Culture (A)  Final    50,000 COLONIES/mL ESCHERICHIA COLI >=100,000 COLONIES/mL KLEBSIELLA PNEUMONIAE    Report Status 01/10/2023 FINAL  Final   Organism ID, Bacteria ESCHERICHIA COLI (A)  Final   Organism ID, Bacteria KLEBSIELLA PNEUMONIAE (A)  Final      Susceptibility   Escherichia coli - MIC*    AMPICILLIN <=2 SENSITIVE Sensitive     CEFAZOLIN <=4 SENSITIVE Sensitive     CEFEPIME <=0.12 SENSITIVE Sensitive     CEFTRIAXONE <=0.25 SENSITIVE Sensitive     CIPROFLOXACIN <=0.25 SENSITIVE Sensitive     GENTAMICIN <=1 SENSITIVE Sensitive     IMIPENEM <=0.25 SENSITIVE Sensitive     NITROFURANTOIN <=16 SENSITIVE Sensitive     TRIMETH/SULFA <=20 SENSITIVE Sensitive     AMPICILLIN/SULBACTAM <=2 SENSITIVE Sensitive     PIP/TAZO <=4 SENSITIVE Sensitive ug/mL    * 50,000 COLONIES/mL ESCHERICHIA COLI   Klebsiella pneumoniae - MIC*    AMPICILLIN >=32 RESISTANT Resistant     CEFAZOLIN <=4 SENSITIVE Sensitive     CEFEPIME <=0.12 SENSITIVE Sensitive     CEFTRIAXONE <=0.25 SENSITIVE Sensitive     CIPROFLOXACIN <=0.25 SENSITIVE Sensitive     GENTAMICIN <=1 SENSITIVE Sensitive     IMIPENEM <=0.25 SENSITIVE Sensitive     NITROFURANTOIN 64 INTERMEDIATE Intermediate      TRIMETH/SULFA <=20 SENSITIVE Sensitive     AMPICILLIN/SULBACTAM 8 SENSITIVE Sensitive     PIP/TAZO <=4 SENSITIVE Sensitive ug/mL    * >=100,000 COLONIES/mL KLEBSIELLA PNEUMONIAE  SARS Coronavirus 2 by RT PCR (hospital order, performed in [facility] hospital lab) *cepheid single result test* Anterior Nasal Swab     Status: None   Collection Time: 01/07/23  9:42 PM   Specimen: Anterior Nasal Swab  Result Value Ref Range Status   SARS Coronavirus 2 by RT PCR NEGATIVE NEGATIVE Final    Comment: (NOTE) SARS-CoV-2 target nucleic acids are NOT DETECTED.  The SARS-CoV-2 RNA is generally detectable in upper and lower respiratory specimens during the acute phase of infection. The lowest concentration of SARS-CoV-2 viral copies this assay can detect is 250 copies / mL. A negative result does not preclude SARS-CoV-2 infection and should not be used as the sole basis for treatment or other patient management decisions.  A negative result may occur with improper specimen collection / handling, submission of specimen other than nasopharyngeal swab, presence of viral mutation(s) within the areas targeted by this assay, and inadequate number of viral copies (<250 copies / mL). A negative result must be combined with clinical observations, patient history, and epidemiological information.  Fact Sheet for Patients:   RoadLapTop.co.za  Fact Sheet for Healthcare Providers: http://kim-miller.com/  This test is not yet approved or  cleared by  the [employer] and has been authorized for detection and/or diagnosis of SARS-CoV-2 by FDA under an Emergency Use Authorization (EUA).  This EUA will remain in effect (meaning this test can be used) for the duration of the COVID-19 declaration under Section 564(b)(1) of the Act, 21 U.S.C. section 360bbb-3(b)(1), unless the authorization is terminated or revoked sooner.  Performed at [facility], [address], [place], [place] [address]   Culture, blood (Routine X 2) w Reflex to ID Panel     Status: None (Preliminary result)   Collection Time: 01/07/23 11:20 PM   Specimen: BLOOD  Result Value Ref Range Status   Specimen Description   Final    BLOOD BLOOD LEFT HAND AEROBIC BOTTLE ONLY Performed at [facility], [address], [place], [place] [address]    Special Requests   Final    Blood Culture adequate volume BOTTLES DRAWN AEROBIC ONLY Performed at [facility], [address] [place], [place] [address]    Culture   Final    NO GROWTH 2 DAYS Performed at [facility], [address], [place], [place] [address]    Report Status PENDING  Incomplete  Culture, blood (Routine X 2) w Reflex to ID Panel     Status: None (Preliminary result)   Collection Time: 01/07/23 11:20 PM   Specimen: BLOOD  Result Value Ref Range Status   Specimen Description   Final    BLOOD BLOOD LEFT HAND AEROBIC BOTTLE ONLY Performed at [facility], [address] [place], [place] [address]    Special Requests   Final    Blood Culture adequate volume BOTTLES DRAWN AEROBIC ONLY Performed at [facility], [address] [place], [place] [address]    Culture   Final    NO GROWTH 2 DAYS Performed at [facility], [address] [place], [place] [address]    Report Status PENDING  Incomplete     Time coordinating discharge: Over 30 minutes  SIGNED:   Azucena Fallen, DO [facility] 01/11/2023, 7:19 AM Pager   If 7PM-7AM, please contact night-coverage [email or web address]

## 2023-01-11 NOTE — Plan of Care (Signed)
Monitor labs and vital signs. Monitor blood glucose levels. Administer basal and sliding scale coverage as ordered. Monitor intake and output. Administer prn stool softeners/laxatives as needed. Reinforce education as needed. Continue current plan of care.

## 2023-01-13 LAB — CULTURE, BLOOD (ROUTINE X 2)
Culture: NO GROWTH
Culture: NO GROWTH
Special Requests: ADEQUATE
Special Requests: ADEQUATE

## 2023-01-18 DIAGNOSIS — D649 Anemia, unspecified: Secondary | ICD-10-CM | POA: Diagnosis not present

## 2023-01-18 DIAGNOSIS — E1122 Type 2 diabetes mellitus with diabetic chronic kidney disease: Secondary | ICD-10-CM | POA: Diagnosis not present

## 2023-01-18 DIAGNOSIS — R195 Other fecal abnormalities: Secondary | ICD-10-CM | POA: Diagnosis not present

## 2023-01-18 DIAGNOSIS — N179 Acute kidney failure, unspecified: Secondary | ICD-10-CM | POA: Diagnosis not present

## 2023-01-18 DIAGNOSIS — R319 Hematuria, unspecified: Secondary | ICD-10-CM | POA: Diagnosis not present

## 2023-01-18 DIAGNOSIS — N3040 Irradiation cystitis without hematuria: Secondary | ICD-10-CM | POA: Diagnosis not present

## 2023-01-22 DIAGNOSIS — R338 Other retention of urine: Secondary | ICD-10-CM | POA: Diagnosis not present

## 2023-01-22 DIAGNOSIS — C61 Malignant neoplasm of prostate: Secondary | ICD-10-CM | POA: Diagnosis not present

## 2023-01-22 DIAGNOSIS — R310 Gross hematuria: Secondary | ICD-10-CM | POA: Diagnosis not present

## 2023-01-23 ENCOUNTER — Other Ambulatory Visit: Payer: Self-pay | Admitting: Radiology

## 2023-01-23 ENCOUNTER — Other Ambulatory Visit (HOSPITAL_COMMUNITY): Payer: Self-pay | Admitting: Nurse Practitioner

## 2023-01-23 DIAGNOSIS — R310 Gross hematuria: Secondary | ICD-10-CM

## 2023-01-23 DIAGNOSIS — R338 Other retention of urine: Secondary | ICD-10-CM | POA: Diagnosis not present

## 2023-01-23 DIAGNOSIS — R339 Retention of urine, unspecified: Secondary | ICD-10-CM

## 2023-01-23 DIAGNOSIS — N3040 Irradiation cystitis without hematuria: Secondary | ICD-10-CM

## 2023-01-24 ENCOUNTER — Other Ambulatory Visit: Payer: Self-pay

## 2023-01-24 ENCOUNTER — Ambulatory Visit (HOSPITAL_COMMUNITY)
Admission: RE | Admit: 2023-01-24 | Discharge: 2023-01-24 | Disposition: A | Discharge status: Home / Self Care | Payer: Medicare Other | Source: Ambulatory Visit | Attending: Nurse Practitioner | Admitting: Nurse Practitioner

## 2023-01-24 ENCOUNTER — Encounter (HOSPITAL_COMMUNITY): Payer: Self-pay

## 2023-01-24 DIAGNOSIS — R310 Gross hematuria: Secondary | ICD-10-CM | POA: Insufficient documentation

## 2023-01-24 DIAGNOSIS — M47816 Spondylosis without myelopathy or radiculopathy, lumbar region: Secondary | ICD-10-CM | POA: Diagnosis not present

## 2023-01-24 DIAGNOSIS — Z9049 Acquired absence of other specified parts of digestive tract: Secondary | ICD-10-CM | POA: Diagnosis not present

## 2023-01-24 DIAGNOSIS — C61 Malignant neoplasm of prostate: Secondary | ICD-10-CM | POA: Insufficient documentation

## 2023-01-24 DIAGNOSIS — N3040 Irradiation cystitis without hematuria: Secondary | ICD-10-CM | POA: Insufficient documentation

## 2023-01-24 DIAGNOSIS — Z8546 Personal history of malignant neoplasm of prostate: Secondary | ICD-10-CM | POA: Diagnosis not present

## 2023-01-24 DIAGNOSIS — R339 Retention of urine, unspecified: Secondary | ICD-10-CM | POA: Insufficient documentation

## 2023-01-24 DIAGNOSIS — N329 Bladder disorder, unspecified: Secondary | ICD-10-CM | POA: Diagnosis not present

## 2023-01-24 LAB — CBC
HCT: 29.9 % — ABNORMAL LOW (ref 39.0–52.0)
Hemoglobin: 9.6 g/dL — ABNORMAL LOW (ref 13.0–17.0)
MCH: 29.5 pg (ref 26.0–34.0)
MCHC: 32.1 g/dL (ref 30.0–36.0)
MCV: 92 fL (ref 80.0–100.0)
Platelets: 223 10*3/uL (ref 150–400)
RBC: 3.25 MIL/uL — ABNORMAL LOW (ref 4.22–5.81)
RDW: 16.1 % — ABNORMAL HIGH (ref 11.5–15.5)
WBC: 9.7 10*3/uL (ref 4.0–10.5)
nRBC: 0 % (ref 0.0–0.2)

## 2023-01-24 LAB — GLUCOSE, CAPILLARY: Glucose-Capillary: 164 mg/dL — ABNORMAL HIGH (ref 70–99)

## 2023-01-24 LAB — PROTIME-INR
INR: 1.1 (ref 0.8–1.2)
Prothrombin Time: 14.4 s (ref 11.4–15.2)

## 2023-01-24 MED ORDER — LIDOCAINE HCL 1 % IJ SOLN
10.0000 mL | Freq: Once | INTRAMUSCULAR | Status: AC
  Administered 2023-01-24: 10 mL via INTRADERMAL

## 2023-01-24 MED ORDER — FENTANYL CITRATE (PF) 100 MCG/2ML IJ SOLN
INTRAMUSCULAR | Status: AC | PRN
  Administered 2023-01-24: 25 ug via INTRAVENOUS

## 2023-01-24 MED ORDER — MIDAZOLAM HCL 2 MG/2ML IJ SOLN
INTRAMUSCULAR | Status: AC | PRN
  Administered 2023-01-24: .5 mg via INTRAVENOUS

## 2023-01-24 MED ORDER — FENTANYL CITRATE (PF) 100 MCG/2ML IJ SOLN
INTRAMUSCULAR | Status: AC
  Filled 2023-01-24: qty 2

## 2023-01-24 MED ORDER — MIDAZOLAM HCL 2 MG/2ML IJ SOLN
INTRAMUSCULAR | Status: AC
  Filled 2023-01-24: qty 2

## 2023-01-24 MED ORDER — FENTANYL CITRATE (PF) 100 MCG/2ML IJ SOLN
INTRAMUSCULAR | Status: AC | PRN
Start: 2023-01-24 — End: 2023-01-24
  Administered 2023-01-24: 25 ug via INTRAVENOUS

## 2023-01-24 NOTE — H&P (Signed)
Chief Complaint: Patient was seen in consultation today for supra pubic catheter placement at the request of Hillery Aldo  Referring Physician(s): Dr  Gaspar Skeeters  Supervising Physician: Gilmer Mor  Patient Status: [facility] - Out-pt  History of Present Illness: [name] is a 87 y.o. male   FULL Code status per pt  Prostate cancer Hx Post TURP and radiation treatments Recent cystoscopy with fulguration 01/10/23  Gross hematuria Indwelling foley cath in place  Scheduled now for supra pubic catheter placement in IR   Past Medical History:  Diagnosis Date   Anemia    Anginal pain (HCC)    Atrial flutter (HCC)    Bladder outlet obstruction    Carotid artery occlusion    Coronary artery disease    post stents; prior CABG; s/p cath January 2014 with 2 VD, patent SVG to OM3 and patent SVG to PL patent   Degenerative disk disease    GERD (gastroesophageal reflux disease)    History of esophageal stricture    "has it stretched q once in awhile" (03/26/2012"   Hyperlipidemia    Hypertension    Kidney stones    Major depression    OSA (obstructive sleep apnea)    "have a mask; haven't used one in years" (03/26/2012)   Prostate cancer (HCC)    S/P mitral valve replacement with bioprosthetic valve 02/20/2014   Skin cancer of nose 2012   "right side" (03/26/2012)   Skin cancer of trunk    "left chest" (03/26/2012)   Spinal stenosis    Stroke (HCC) 2014   NO RESIDUAL PROBLEMS   Type II diabetes mellitus (HCC)    Valvular heart disease     Past Surgical History:  Procedure Laterality Date   A-FLUTTER ABLATION N/A 12/01/2016   Procedure: A-Flutter Ablation;  Surgeon: Hillis Range, MD;  Location: [facility] INVASIVE CV LAB;  Service: Cardiovascular;  Laterality: N/A;   ADENOIDECTOMY  1956   ANTERIOR CERVICAL DECOMP/DISCECTOMY FUSION  2000's   AORTIC VALVE REPLACEMENT  08/29/2010   25 mm CE Magna bioprosthetic - [facility]   APPENDECTOMY  1951   CARDIAC CATHETERIZATION  10/20/2003    Est. EF of 65% -- Critical disease in the mid to distal circumflex --  Preserved left ventricular  function --    CARDIAC VALVE REPLACEMENT     CAROTID ENDARTERECTOMY  05/05/2003   right carotid endarterectomy   CERVICAL DISC SURGERY  1980's   CORONARY ANGIOPLASTY WITH STENT PLACEMENT  10/20/2003   Percutaneous coronary intervention/drug-eluding stent implantation, third marginal branch --  Percutaneous closure, right femoral artery -- Atherosclerotic coronary vascular disease, single vessel / Status post successful percutaneous coronary intervention/tandem drug -- eluding stent implantation proximal third marginal branch -- Typical angina was not reproduced with device insertion or balloon    CORONARY ARTERY BYPASS GRAFT  08/29/2010   CABG X2; SVG to PDA, SVG to OM2- [facility]   CYSTOSCOPY WITH FULGERATION N/A 09/17/2022   Procedure: CYSTOSCOPY WITH FULGERATION AND CLOT EVACUATION;  Surgeon: Loletta Parish., MD;  Location: [facility] ORS;  Service: Urology;  Laterality: N/A;   CYSTOSCOPY WITH FULGERATION N/A 01/09/2023   Procedure: CYSTOSCOPY WITH CLOT EVACUATION, FULGERATION;  Surgeon: Noel Christmas, MD;  Location: [facility] ORS;  Service: Urology;  Laterality: N/A;   ESOPHAGOGASTRODUODENOSCOPY (EGD) WITH PROPOFOL N/A 07/27/2022   Procedure: ESOPHAGOGASTRODUODENOSCOPY (EGD) WITH PROPOFOL;  Surgeon: Vida Rigger, MD;  Location: [facility] ENDOSCOPY;  Service: Gastroenterology;  Laterality: N/A;   HEMATOMA EVACUATION  05/09/2003  Evacuation of hematoma, right neck   LEFT HEART CATHETERIZATION WITH CORONARY ANGIOGRAM N/A 03/27/2012   Procedure: LEFT HEART CATHETERIZATION WITH CORONARY ANGIOGRAM;  Surgeon: Peter M Swaziland, MD;  Location: [facility] CATH LAB;  Service: Cardiovascular;  Laterality: N/A;   LUMBAR DISC SURGERY  1996   MITRAL VALVE REPAIR  08/23/2010   28 mm Simulus ring -[facility]   POSTERIOR FUSION LUMBAR SPINE  1997   PROSTATE BIOPSY     SHOULDER ARTHROSCOPY  01/07/2007   Left shoulder impingement, labral  tear   SKIN CANCER EXCISION  1990's; 2012   "left chest & right nose" (03/26/2012)   TEE WITHOUT CARDIOVERSION N/A 05/15/2012   Procedure: TRANSESOPHAGEAL ECHOCARDIOGRAM (TEE);  Surgeon: Vesta Mixer, MD;  Location: [facility] ENDOSCOPY;  Service: Cardiovascular;  Laterality: N/A;   TEE WITHOUT CARDIOVERSION N/A 12/01/2016   Procedure: TRANSESOPHAGEAL ECHOCARDIOGRAM (TEE);  Surgeon: Pricilla Riffle, MD;  Location: [facility] ENDOSCOPY;  Service: Cardiovascular;  Laterality: N/A;   TONSILLECTOMY AND ADENOIDECTOMY  1956   TRANSURETHRAL RESECTION OF PROSTATE N/A 04/26/2015   Procedure: TRANSURETHRAL RESECTION OF THE PROSTATE WITH GYRUS INSTRUMENTS;  Surgeon: Crist Fat, MD;  Location: [facility] ORS;  Service: Urology;  Laterality: N/A;  TURP-BIPOLAR    Allergies: Patient has no known allergies.  Medications: Prior to Admission medications   Medication Sig Start Date End Date Taking? Authorizing Provider  ferrous sulfate 325 (65 FE) MG tablet Take 1 tablet (325 mg total) by mouth daily with breakfast. 07/28/22  Yes Adhikari, Amrit, MD  FLUoxetine (PROZAC) 20 MG capsule Take 20 mg by mouth at bedtime.    Yes [provider]  furosemide (LASIX) 40 MG tablet Take 40 mg by mouth daily.   Yes [provider]  gabapentin (NEURONTIN) 600 MG tablet Take 600 mg by mouth 2 (two) times daily. 07/17/22  Yes [provider]  hyoscyamine (LEVSIN SL) 0.125 MG SL tablet Place 2 tablets (0.25 mg total) under the tongue every 4 (four) hours as needed for cramping (or bladder spasms). 01/11/23  Yes Azucena Fallen, MD  insulin lispro protamine-lispro (HUMALOG 75/25 MIX) (75-25) 100 UNIT/ML SUSP injection Inject 16 Units into the skin 2 (two) times daily with a meal.   Yes [provider]  losartan (COZAAR) 50 MG tablet Take 50 mg by mouth daily.   Yes [provider]  Melatonin 3 MG TABS Take 1 tablet by mouth at bedtime.    Yes [provider]  metFORMIN (GLUCOPHAGE-XR) 500  MG 24 hr tablet Take 500 mg by mouth 2 (two) times daily with a meal. 12/12/21  Yes [provider]  metoprolol succinate (TOPROL-XL) 100 MG 24 hr tablet Take 100 mg by mouth at bedtime.   Yes [provider]  Multiple Vitamin (MULTIVITAMIN) tablet Take 1 tablet by mouth daily.   Yes [provider]  rosuvastatin (CRESTOR) 10 MG tablet Take 10 mg by mouth every morning.    Yes [provider]  tamsulosin (FLOMAX) 0.4 MG CAPS capsule Take 0.4 mg by mouth daily. 12/04/22  Yes [provider]  zolpidem (AMBIEN) 5 MG tablet Take 2.5 mg by mouth at bedtime as needed for sleep.   Yes [provider]  nitroGLYCERIN (NITROSTAT) 0.4 MG SL tablet Place 1 tablet (0.4 mg total) under the tongue every 5 (five) minutes as needed. For chest pain Patient taking differently: Place 0.4 mg under the tongue every 5 (five) minutes as needed for chest pain. For chest pain 03/28/12   [name], Loistine Chance  J, MD     Family History  Problem Relation Age of Onset   Diabetes Mother    Coronary artery disease Mother    Heart disease Mother    Stroke Father    Cancer Father        prostate   Cancer Daughter        lymphoma hodgkins    Seizures Daughter     Social History   Socioeconomic History   Marital status: Married    Spouse name: Not on file   Number of children: Not on file   Years of education: Not on file   Highest education level: Not on file  Occupational History   Not on file  Tobacco Use   Smoking status: Former    Current packs/day: 0.00    Average packs/day: 5.0 packs/day for 23.0 years (115.0 ttl pk-yrs)    Types: Cigarettes    Start date: 10/01/1955    Quit date: 10/01/1978    Years since quitting: 44.3   Smokeless tobacco: Never  Vaping Use   Vaping status: Never Used  Substance and Sexual Activity   Alcohol use: No    Comment: 03/26/2012 "last alcohol was in 1970; never had problem w/it"   Drug use: No   Sexual activity: Never  Other  Topics Concern   Not on file  Social History Narrative   Not on file   Social Determinants of Health   Financial Resource Strain: Not on file  Food Insecurity: No Food Insecurity (01/07/2023)   Hunger Vital Sign    Worried About Running Out of Food in the Last Year: Never true    Ran Out of Food in the Last Year: Never true  Transportation Needs: No Transportation Needs (01/07/2023)   PRAPARE - Administrator, Civil Service (Medical): No    Lack of Transportation (Non-Medical): No  Physical Activity: Not on file  Stress: Not on file  Social Connections: Not on file    Review of Systems: A 12 point ROS discussed and pertinent positives are indicated in the HPI above.  All other systems are negative.  Review of Systems  Constitutional:  Positive for activity change. Negative for fatigue.  Respiratory:  Negative for cough and shortness of breath.   Gastrointestinal:  Negative for abdominal pain.  Psychiatric/Behavioral:  Negative for behavioral problems and confusion.     Vital Signs: BP (!) 156/65   Pulse (!) 53   Temp (!) 97.3 F (36.3 C) (Tympanic)   Resp 14   Ht 5\' 6"  (1.676 m)   Wt 150 lb (68 kg)   SpO2 97%   BMI 24.21 kg/m   Advance Care Plan: The advanced care plan/surrogate decision maker was discussed at the time of visit and documented in the medical record.    Physical Exam Vitals reviewed.  HENT:     Mouth/Throat:     Mouth: Mucous membranes are moist.  Cardiovascular:     Rate and Rhythm: Normal rate and regular rhythm.     Heart sounds: Murmur heard.  Abdominal:     Palpations: Abdomen is soft.  Musculoskeletal:        General: Normal range of motion.  Skin:    General: Skin is warm.  Neurological:     Mental Status: He is alert and oriented to person, place, and time.  Psychiatric:        Behavior: Behavior normal.     Imaging: CT ABDOMEN PELVIS WO CONTRAST  Result  Date: 01/07/2023 CLINICAL DATA:  Acute nonlocalized  abdominal pain EXAM: CT ABDOMEN AND PELVIS WITHOUT CONTRAST TECHNIQUE: Multidetector CT imaging of the abdomen and pelvis was performed following the standard protocol without IV contrast. RADIATION DOSE REDUCTION: This exam was performed according to the departmental dose-optimization program which includes automated exposure control, adjustment of the mA and/or kV according to patient size and/or use of iterative reconstruction technique. COMPARISON:  08/18/2022 FINDINGS: Lower chest: No acute abnormality. Mild cardiomegaly. Small hiatal hernia. Hepatobiliary: No focal liver abnormality is seen. No gallstones, gallbladder wall thickening, or biliary dilatation. Pancreas: Unremarkable Spleen: Unremarkable Adrenals/Urinary Tract: The adrenal glands are unremarkable. The kidneys are normal in size and position. Mild-to-moderate bilateral perinephric stranding is present, nonspecific, but progressive since prior examination suggesting un underlying inflammatory process involving the kidneys. No hydronephrosis. Circumferential thickening of the bladder wall is again seen suggesting changes of chronic bladder outlet obstruction. Interval placement of a Foley catheter with its retaining balloon seen within the bladder lumen. Small amount of high density material is seen dependently within the bladder lumen surrounding the Foley catheter balloon likely representing a small blood clot. Stomach/Bowel: Stomach, small bowel, and large bowel are unremarkable. Circumferential thickening of the rectum is again noted, not well characterized on this examination but similar to that noted on prior exam. Mild presacral and perirectal edema is again noted and, together, the findings suggest changes of a chronic infectious or inflammatory proctitis. No free intraperitoneal gas or fluid. Vascular/Lymphatic: Aortic atherosclerosis. No enlarged abdominal or pelvic lymph nodes. Reproductive: Brachytherapy seeds are seen posterior to the  prostate gland. The prostate gland is atrophic, particular when compared to remote prior examination of 03/03/2015. Other: No abdominal wall hernia Musculoskeletal: Left L4 hemilaminectomy and L4-5 anterior lumbar fusion with instrumentation with solid incorporation of interbody bone graft. Advanced degenerative changes are seen within the lumbar spine. No acute bone abnormality. No lytic or blastic bone lesion. IMPRESSION: 1. Interval placement of a Foley catheter with its retaining balloon seen within the bladder lumen. Small amount of high density material is seen dependently within the bladder lumen surrounding the Foley catheter balloon likely representing a small blood clot. 2. Progressive mild peri-renal stranding, nonspecific, but suggesting an underlying inflammatory process involving the kidneys bilaterally. Correlation with renal function tests and possibly urinalysis and urine culture may be helpful. 3. Stable circumferential thickening of the rectum with mild presacral and perirectal edema suggesting changes of a chronic infectious or inflammatory proctitis. 4. Mild cardiomegaly. Aortic Atherosclerosis (ICD10-I70.0). Electronically Signed   By: Helyn Numbers M.D.   On: 01/07/2023 22:01   CT Cervical Spine Wo Contrast  Result Date: 01/07/2023 CLINICAL DATA:  Poly trauma, blunt EXAM: CT CERVICAL SPINE WITHOUT CONTRAST TECHNIQUE: Multidetector CT imaging of the cervical spine was performed without intravenous contrast. Multiplanar CT image reconstructions were also generated. RADIATION DOSE REDUCTION: This exam was performed according to the departmental dose-optimization program which includes automated exposure control, adjustment of the mA and/or kV according to patient size and/or use of iterative reconstruction technique. COMPARISON:  Cervical spine radiographs 08/22/2005. MRI cervical spine 07/25/2005 FINDINGS: Alignment: Slight anterior subluxation of C7 on T1, unchanged since prior study.  Otherwise normal alignment. Normal alignment of the posterior elements. Skull base and vertebrae: Skull base appears intact. No vertebral compression deformities. No focal bone lesion or bone destruction. Soft tissues and spinal canal: No prevertebral soft tissue swelling. No abnormal paraspinal soft tissue mass or infiltration. Disc levels: Postoperative changes with anterior plate and screw fixation from  C4 through C7 with intervertebral fusion. Fusion appears intact. Degenerative changes in the other interspace levels with disc space narrowing and osteophyte formation. Prominent degenerative changes at C1-2. Degenerative changes in the posterior facet joints. Upper chest: Lung apices are clear. Other: Vascular calcifications. IMPRESSION: 1. No change in alignment since previous studies. No acute displaced fractures identified. 2. Anterior plate and screw fixation with intervertebral fusion from C4 through C7 appears intact. 3. Degenerative changes. Electronically Signed   By: Burman Nieves M.D.   On: 01/07/2023 20:12   CT Head Wo Contrast  Result Date: 01/07/2023 CLINICAL DATA:  Poly trauma, blunt EXAM: CT HEAD WITHOUT CONTRAST TECHNIQUE: Contiguous axial images were obtained from the base of the skull through the vertex without intravenous contrast. RADIATION DOSE REDUCTION: This exam was performed according to the departmental dose-optimization program which includes automated exposure control, adjustment of the mA and/or kV according to patient size and/or use of iterative reconstruction technique. COMPARISON:  CT head 07/24/2022.  MRI brain 04/12/2016 FINDINGS: Brain: Diffuse cerebral atrophy. Ventricular dilatation consistent with central atrophy. Low-attenuation changes in the deep white matter consistent with small vessel ischemia. Small focal area of encephalomalacia in the right post frontal gyrus suggesting old infarct. No change since prior study. No abnormal extra-axial fluid collections. No  mass effect or midline shift. Gray-white matter junctions are distinct. Basal cisterns are not effaced. No acute intracranial hemorrhage. Vascular: No hyperdense vessel or unexpected calcification. Skull: Normal. Negative for fracture or focal lesion. Sinuses/Orbits: No acute finding. Other: None. IMPRESSION: No acute intracranial abnormalities. Chronic atrophy and small vessel ischemic changes. Old focal infarct on the right. Electronically Signed   By: Burman Nieves M.D.   On: 01/07/2023 20:06    Labs:  CBC: Recent Labs    01/09/23 0710 01/10/23 0517 01/11/23 0459 01/24/23 0720  WBC 13.0* 9.5 12.0* 9.7  HGB 8.3* 7.5* 10.6* 9.6*  HCT 25.6* 22.9* 32.0* 29.9*  PLT 216 216 282 223    COAGS: Recent Labs    07/24/22 2016 01/07/23 1700 01/07/23 2320  INR 1.2 1.2 1.3*    BMP: Recent Labs    01/07/23 2320 01/09/23 0710 01/10/23 0517 01/11/23 0459  NA 136 137 136 139  K 3.9 3.8 3.8 3.8  CL 108 107 107 104  CO2 19* 21* 21* 26  GLUCOSE 196* 167* 172* 134*  BUN 26* 16 18 20   CALCIUM 8.0* 8.5* 8.5* 8.8*  CREATININE 1.65* 1.24 1.12 1.16  GFRNONAA 41* 57* >60 >60    LIVER FUNCTION TESTS: Recent Labs    01/07/23 2320 01/09/23 0710 01/10/23 0517 01/11/23 0459  BILITOT 0.6 0.7 0.5 0.6  AST 45* 49* 35 37  ALT 29 41 39 41  ALKPHOS 65 62 58 63  PROT 5.8* 5.9* 5.6* 5.9*  ALBUMIN 2.9* 2.9* 2.6* 2.8*    TUMOR MARKERS: No results for input(s): "AFPTM", "CEA", "CA199", "CHROMGRNA" in the last 8760 hours.  Assessment and Plan:  Scheduled for supra pubic catheter placement Pt is aware of procedure benefits and risks including but limited to Infection, bleeding, organ damage and damage to surrounding structures Agreeable to proceed Consent in chart  Thank you for this interesting consult.  I greatly enjoyed meeting [name] and look forward to participating in their care.  A copy of this report was sent to the requesting provider on this date.  Electronically  Signed: Robet Leu, PA-C 01/24/2023, 7:38 AM   I spent a total of  30 Minutes   in  face to face in clinical consultation, greater than 50% of which was counseling/coordinating care for supra pubic catheter placement

## 2023-01-24 NOTE — Progress Notes (Signed)
Urinary foley was clamped at 0710

## 2023-01-24 NOTE — Progress Notes (Signed)
Hit wrong button/ pt can have blood products

## 2023-01-24 NOTE — Procedures (Addendum)
Interventional Radiology Procedure Note  Procedure: Image guided drain placement, suprapubic catheter.  17F pigtail drain.  Complications: None  EBL: None    Recommendations: - Routine drain care  - advance diet - routine wound care - 1 hr dc home - can return in 4-6 weeks for upsize to council tip urinary catheter - remove foley in recovery  Signed,  Yvone Neu. Loreta Ave, DO

## 2023-01-31 ENCOUNTER — Other Ambulatory Visit (HOSPITAL_COMMUNITY): Payer: Self-pay | Admitting: Nurse Practitioner

## 2023-01-31 DIAGNOSIS — R339 Retention of urine, unspecified: Secondary | ICD-10-CM

## 2023-02-01 ENCOUNTER — Ambulatory Visit (HOSPITAL_COMMUNITY): Payer: Medicare Other

## 2023-02-07 ENCOUNTER — Encounter (HOSPITAL_BASED_OUTPATIENT_CLINIC_OR_DEPARTMENT_OTHER): Payer: Medicare Other | Attending: General Surgery | Admitting: General Surgery

## 2023-02-07 DIAGNOSIS — G4730 Sleep apnea, unspecified: Secondary | ICD-10-CM | POA: Diagnosis not present

## 2023-02-07 DIAGNOSIS — N3041 Irradiation cystitis with hematuria: Secondary | ICD-10-CM | POA: Insufficient documentation

## 2023-02-07 DIAGNOSIS — Z8546 Personal history of malignant neoplasm of prostate: Secondary | ICD-10-CM | POA: Diagnosis not present

## 2023-02-07 DIAGNOSIS — N3040 Irradiation cystitis without hematuria: Secondary | ICD-10-CM | POA: Diagnosis not present

## 2023-02-07 DIAGNOSIS — E119 Type 2 diabetes mellitus without complications: Secondary | ICD-10-CM | POA: Insufficient documentation

## 2023-02-07 DIAGNOSIS — K219 Gastro-esophageal reflux disease without esophagitis: Secondary | ICD-10-CM | POA: Insufficient documentation

## 2023-02-07 DIAGNOSIS — E1122 Type 2 diabetes mellitus with diabetic chronic kidney disease: Secondary | ICD-10-CM | POA: Diagnosis not present

## 2023-02-07 DIAGNOSIS — I110 Hypertensive heart disease with heart failure: Secondary | ICD-10-CM | POA: Insufficient documentation

## 2023-02-07 DIAGNOSIS — R319 Hematuria, unspecified: Secondary | ICD-10-CM | POA: Diagnosis not present

## 2023-02-07 DIAGNOSIS — Z953 Presence of xenogenic heart valve: Secondary | ICD-10-CM | POA: Diagnosis not present

## 2023-02-07 DIAGNOSIS — Z923 Personal history of irradiation: Secondary | ICD-10-CM | POA: Insufficient documentation

## 2023-02-07 DIAGNOSIS — I2510 Atherosclerotic heart disease of native coronary artery without angina pectoris: Secondary | ICD-10-CM | POA: Insufficient documentation

## 2023-02-07 DIAGNOSIS — I5032 Chronic diastolic (congestive) heart failure: Secondary | ICD-10-CM | POA: Diagnosis not present

## 2023-02-07 DIAGNOSIS — L598 Other specified disorders of the skin and subcutaneous tissue related to radiation: Secondary | ICD-10-CM | POA: Diagnosis not present

## 2023-02-07 DIAGNOSIS — D649 Anemia, unspecified: Secondary | ICD-10-CM | POA: Diagnosis not present

## 2023-02-07 NOTE — Progress Notes (Signed)
[name], [name] ([id number]) [id number] Page 1 of 6 Visit Report for 02/07/2023 Allergy List Details Patient Name: Date of Service: [name], [name] 02/07/2023 12:45 PM Medical Record Number: [id number] Patient Account Number: [id number] Date of Birth/Sex: Treating RN: [birth year] (87 y.o. [name] Primary Care Kamden Reber: Renford Dills Other Clinician: Referring Wakisha Alberts: Treating Keeara Frees/Extender: Layla Maw in Treatment: 0 Allergies Active Allergies No Known Allergies Allergy Notes Electronic Signature(s) Signed: 02/07/2023 3:24:43 PM By: Samuella Bruin Entered By: Samuella Bruin on 02/07/2023 04:52:26 -------------------------------------------------------------------------------- Arrival Information Details Patient Name: Date of Service: [name], [name] 02/07/2023 12:45 PM Medical Record Number: [id number] Patient Account Number: [id number] Date of Birth/Sex: Treating RN: 1936/01/28 (87 y.o. [name] Primary Care Rickia Freeburg: Renford Dills Other Clinician: Referring Mang Hazelrigg: Treating Dmani Mizer/Extender: Layla Maw in Treatment: 0 Visit Information Patient Arrived: Cloyde Reams Time: 12:49 Accompanied By: wife Transfer Assistance: Manual Patient Identification Verified: Yes Secondary Verification Process Completed: Yes Electronic Signature(s) Signed: 02/07/2023 3:24:43 PM By: Samuella Bruin Entered By: Samuella Bruin on 02/07/2023 09:50:36 -------------------------------------------------------------------------------- Clinic Level of Care Assessment Details Patient Name: Date of Service: [name], [name] 02/07/2023 12:45 PM Medical Record Number: [id number] Patient Account Number: [id number] Date of Birth/Sex: Treating RN: 07-06-1935 (87 y.o. [name] Primary Care Naamah Boggess: Renford Dills Other Clinician: DANTRELL, VANROSSUM  ([id number]) [id number] Page 2 of 6 Referring Tavaughn Silguero: Treating Shanikqua Zarzycki/Extender: Layla Maw in Treatment: 0 Clinic Level of Care Assessment Items TOOL 2 Quantity Score X- 1 0 Use when only an EandM is performed on the INITIAL visit ASSESSMENTS - Nursing Assessment / Reassessment X- 1 20 General Physical Exam (combine w/ comprehensive assessment (listed just below) when performed on new pt. evals) X- 1 25 Comprehensive Assessment (HX, ROS, Risk Assessments, Wounds Hx, etc.) ASSESSMENTS - Wound and Skin A ssessment / Reassessment []  - 0 Simple Wound Assessment / Reassessment - one wound []  - 0 Complex Wound Assessment / Reassessment - multiple wounds []  - 0 Dermatologic / Skin Assessment (not related to wound area) ASSESSMENTS - Ostomy and/or Continence Assessment and Care []  - 0 Incontinence Assessment and Management []  - 0 Ostomy Care Assessment and Management (repouching, etc.) PROCESS - Coordination of Care X - Simple Patient / Family Education for ongoing care 1 15 []  - 0 Complex (extensive) Patient / Family Education for ongoing care X- 1 10 Staff obtains Chiropractor, Records, T Results / Process Orders est []  - 0 Staff telephones HHA, Nursing Homes / Clarify orders / etc []  - 0 Routine Transfer to another Facility (non-emergent condition) []  - 0 Routine Hospital Admission (non-emergent condition) []  - 0 New Admissions / Manufacturing engineer / Ordering NPWT Apligraf, etc. , []  - 0 Emergency Hospital Admission (emergent condition) X- 1 10 Simple Discharge Coordination []  - 0 Complex (extensive) Discharge Coordination PROCESS - Special Needs []  - 0 Pediatric / Minor Patient Management []  - 0 Isolation Patient Management []  - 0 Hearing / Language / Visual special needs []  - 0 Assessment of Community assistance (transportation, D/C planning, etc.) []  - 0 Additional assistance / Altered mentation []  -  0 Support Surface(s) Assessment (bed, cushion, seat, etc.) INTERVENTIONS - Wound Cleansing / Measurement []  - 0 Wound Imaging (photographs - any number of wounds) []  - 0 Wound Tracing (instead of photographs) []  - 0 Simple Wound Measurement - one wound []  - 0 Complex Wound Measurement - multiple wounds []  - 0 Simple Wound Cleansing - one wound []  - 0  Complex Wound Cleansing - multiple wounds INTERVENTIONS - Wound Dressings []  - 0 Small Wound Dressing one or multiple wounds []  - 0 Medium Wound Dressing one or multiple wounds []  - 0 Large Wound Dressing one or multiple wounds []  - 0 Application of Medications - injection INTERVENTIONS - Miscellaneous [name], [name] ([id number]) [id number] Page 3 of 6 []  - 0 External ear exam []  - 0 Specimen Collection (cultures, biopsies, blood, body fluids, etc.) []  - 0 Specimen(s) / Culture(s) sent or taken to Lab for analysis []  - 0 Patient Transfer (multiple staff / Michiel Sites Lift / Similar devices) []  - 0 Simple Staple / Suture removal (25 or less) []  - 0 Complex Staple / Suture removal (26 or more) []  - 0 Hypo / Hyperglycemic Management (close monitor of Blood Glucose) []  - 0 Ankle / Brachial Index (ABI) - do not check if billed separately Has the patient been seen at the hospital within the last three years: Yes Total Score: 80 Level Of Care: New/Established - Level 3 Electronic Signature(s) Signed: 02/07/2023 3:24:43 PM By: Samuella Bruin Entered By: Samuella Bruin on 02/07/2023 10:16:54 -------------------------------------------------------------------------------- Encounter Discharge Information Details Patient Name: Date of Service: [name], [name] 02/07/2023 12:45 PM Medical Record Number: [id number] Patient Account Number: [id number] Date of Birth/Sex: Treating RN: 1935-10-07 (87 y.o. [name] Primary Care Phyliss Hulick: Renford Dills Other Clinician: Referring  Camaya Gannett: Treating Corayma Cashatt/Extender: Layla Maw in Treatment: 0 Encounter Discharge Information Items Discharge Condition: Stable Ambulatory Status: Walker Discharge Destination: Home Transportation: Private Auto Accompanied By: wife Schedule Follow-up Appointment: Yes Clinical Summary of Care: Patient Declined Electronic Signature(s) Signed: 02/07/2023 3:24:43 PM By: Samuella Bruin Entered By: Samuella Bruin on 02/07/2023 10:17:19 -------------------------------------------------------------------------------- Lower Extremity Assessment Details Patient Name: Date of Service: [name], [name] 02/07/2023 12:45 PM Medical Record Number: [id number] Patient Account Number: [id number] Date of Birth/Sex: Treating RN: 1935-05-18 (87 y.o. [name] Primary Care Raudel Bazen: Renford Dills Other Clinician: Referring Lasha Echeverria: Treating Nygeria Lager/Extender: Layla Maw in Treatment: 0 Electronic Signature(s) Signed: 02/07/2023 3:24:43 PM By: Gelene Mink By: Samuella Bruin on 02/07/2023 09:53:33 Pelle, [name] ([id number]) [id number] Page 4 of 6 -------------------------------------------------------------------------------- Multi Wound Chart Details Patient Name: Date of Service: [name], [name] 02/07/2023 12:45 PM Medical Record Number: [id number] Patient Account Number: [id number] Date of Birth/Sex: Treating RN: 1936/02/27 (87 y.o. M) Primary Care Keldrick Pomplun: Renford Dills Other Clinician: Referring Illyanna Petillo: Treating [name] Quackenbush/Extender: Layla Maw in Treatment: 0 Vital Signs Height(in): 66 Capillary Blood Glucose(mg/dl): 616 Weight(lbs): 073 Pulse(bpm): 60 Body Mass Index(BMI): 24.2 Blood Pressure(mmHg): 169/72 Temperature(F): 97.7 Respiratory Rate(breaths/min): 18 [Treatment Notes:Wound Assessments Treatment Notes] Electronic  Signature(s) Signed: 02/07/2023 1:57:48 PM By: Duanne Guess MD FACS Entered By: Duanne Guess on 02/07/2023 10:57:48 -------------------------------------------------------------------------------- Multi-Disciplinary Care Plan Details Patient Name: Date of Service: [name], [name] 02/07/2023 12:45 PM Medical Record Number: [id number] Patient Account Number: [id number] Date of Birth/Sex: Treating RN: Jan 04, 1936 (87 y.o. [name] Primary Care Earley Grobe: Renford Dills Other Clinician: Referring Oaklee Esther: Treating Hussam Muniz/Extender: Layla Maw in Treatment: 0 Active Inactive HBO Nursing Diagnoses: Anxiety related to knowledge deficit of hyperbaric oxygen therapy and treatment procedures Potential for barotraumas to ears, sinuses, teeth, and lungs or cerebral gas embolism related to changes in atmospheric pressure inside hyperbaric oxygen chamber Potential for oxygen toxicity seizures related to delivery of 100% oxygen at an increased atmospheric pressure Potential for pulmonary oxygen toxicity related to delivery of 100% oxygen at an increased atmospheric  pressure Goals: Barotrauma will be prevented during HBO2 Date Initiated: 02/07/2023 T arget Resolution Date: 03/24/2023 Goal Status: Active Patient and/or family will be able to state/discuss factors appropriate to the management of their disease process during treatment Date Initiated: 02/07/2023 T arget Resolution Date: 03/24/2023 Goal Status: Active Patient will tolerate the hyperbaric oxygen therapy treatment Date Initiated: 02/07/2023 T arget Resolution Date: 03/24/2023 Goal Status: Active Patient will tolerate the internal climate of the chamber Date Initiated: 02/07/2023 T arget Resolution Date: 03/24/2023 Goal Status: Active AIYDAN, STRABALA ([id number]) [phone] Page 5 of 6 Patient/caregiver will verbalize understanding of HBO goals, rationale,  procedures and potential hazards Date Initiated: 02/07/2023 Target Resolution Date: 03/24/2023 Goal Status: Active Signs and symptoms of pulmonary oxygen toxicity will be recognized and promptly addressed Date Initiated: 02/07/2023 Target Resolution Date: 03/24/2023 Goal Status: Active Signs and symptoms of seizure will be recognized and promptly addressed ; seizing patients will suffer no harm Date Initiated: 02/07/2023 Target Resolution Date: 03/24/2023 Goal Status: Active Interventions: Administer decongestants, per physician orders, prior to HBO2 Administer the correct therapeutic gas delivery based on the patients needs and limitations, per physician order Assess and provide for patients comfort related to the hyperbaric environment and equalization of middle ear Assess for signs and symptoms related to adverse events, including but not limited to confinement anxiety, pneumothorax, oxygen toxicity and baurotrauma Assess patient for any history of confinement anxiety Assess patient's knowledge and expectations regarding hyperbaric medicine and provide education related to the hyperbaric environment, goals of treatment and prevention of adverse events Implement protocols to decrease risk of pneumothorax in high risk patients Notes: Electronic Signature(s) Signed: 02/07/2023 3:24:43 PM By: Samuella Bruin Entered By: Samuella Bruin on 02/07/2023 10:15:54 -------------------------------------------------------------------------------- Pain Assessment Details Patient Name: Date of Service: [name], [name] 02/07/2023 12:45 PM Medical Record Number: [id number] Patient Account Number: [id number] Date of Birth/Sex: Treating RN: [birth year] (87 y.o. [name] Primary Care Ravindra Baranek: Renford Dills Other Clinician: Referring Devonne Lalani: Treating Kambri Dismore/Extender: Layla Maw in Treatment: 0 Active Problems Location of Pain Severity and Description  of Pain Patient Has Paino No Site Locations Rate the pain. Current Pain Level: 0 Pain Management and Medication Current Pain Management: Electronic Signature(s) Signed: 02/07/2023 3:24:43 PM By: Samuella Bruin Entered By: Samuella Bruin on 02/07/2023 09:53:40 Corriher, [name] ([id number]) [id number] Page 6 of 6 -------------------------------------------------------------------------------- Patient/Caregiver Education Details Patient Name: Date of Service: [name], BURGE 11/20/2024andnbsp12:45 PM Medical Record Number: [id number] Patient Account Number: [id number] Date of Birth/Gender: Treating RN: [birth year] (87 y.o. [name] Primary Care Physician: Renford Dills Other Clinician: Referring Physician: Treating Physician/Extender: Layla Maw in Treatment: 0 Education Assessment Education Provided To: Patient Education Topics Provided Hyperbaric Oxygenation: Methods: Explain/Verbal Responses: Reinforcements needed, State content correctly Electronic Signature(s) Signed: 02/07/2023 3:24:43 PM By: Samuella Bruin Entered By: Samuella Bruin on 02/07/2023 10:16:04 -------------------------------------------------------------------------------- Vitals Details Patient Name: Date of Service: [name], Raphel S. 02/07/2023 12:45 PM Medical Record Number: [id number] Patient Account Number: [id number] Date of Birth/Sex: Treating RN: [birth year] (87 y.o. [name] Primary Care Runette Scifres: Renford Dills Other Clinician: Referring Doris Mcgilvery: Treating Teonia Yager/Extender: Layla Maw in Treatment: 0 Vital Signs Time Taken: 12:50 Temperature (F): 97.7 Height (in): 66 Pulse (bpm): 60 Source: Stated Respiratory Rate (breaths/min): 18 Weight (lbs): 150 Blood Pressure (mmHg): 169/72 Source: Stated Capillary Blood Glucose (mg/dl): 627 Body Mass Index (BMI):  24.2 Reference Range: 80 - 120 mg / dl Electronic Signature(s) Signed: 02/07/2023 3:24:43 PM By: Ander Slade,  Ladona Ridgel Entered By: Samuella Bruin on 02/07/2023 09:52:22

## 2023-02-07 NOTE — Progress Notes (Signed)
JAMAIRE, DECLERCK ([id number]) [phone] [id number] Page 1 of 4 Visit Report for 02/07/2023 Abuse Risk Screen Details Patient Name: Date of Service: [name], [name] 02/07/2023 12:45 PM Medical Record Number: [id number] Patient Account Number: [id number] Date of Birth/Sex: Treating RN: [birth year] (87 y.o. [name] Primary Care Ronie Fleeger: Renford Dills Other Clinician: Referring Tonni Mansour: Treating Shadrach Bartunek/Extender: Layla Maw in Treatment: 0 Abuse Risk Screen Items Answer ABUSE RISK SCREEN: Has anyone close to you tried to hurt or harm you recentlyo No Do you feel uncomfortable with anyone in your familyo No Has anyone forced you do things that you didnt want to doo No Electronic Signature(s) Signed: 02/07/2023 3:24:43 PM By: Samuella Bruin Entered By: Samuella Bruin on 02/07/2023 09:51:44 -------------------------------------------------------------------------------- Activities of Daily Living Details Patient Name: Date of Service: [name], [name] 02/07/2023 12:45 PM Medical Record Number: [id number] Patient Account Number: [id number] Date of Birth/Sex: Treating RN: 01-30-1936 (86 y.o. [name] Primary Care Odilia Damico: Renford Dills Other Clinician: Referring Ortencia Askari: Treating Stefon Ramthun/Extender: Layla Maw in Treatment: 0 Activities of Daily Living Items Answer Activities of Daily Living (Please select one for each item) Drive Automobile Completely Able T Medications ake Completely Able Use T elephone Completely Able Care for Appearance Completely Able Use T oilet Completely Able Bath / Shower Completely Able Dress Self Completely Able Feed Self Completely Able Walk Completely Able Get In / Out Bed Completely Able Housework Completely Able Prepare Meals Completely Able Handle Money Completely Able Shop for Self Completely Able Electronic  Signature(s) Signed: 02/07/2023 3:24:43 PM By: Samuella Bruin Entered By: Samuella Bruin on 02/07/2023 09:52:07 Bhakta, [name] ([id number]) [id number] Page 2 of 4 -------------------------------------------------------------------------------- Education Screening Details Patient Name: Date of Service: [name], [name] 02/07/2023 12:45 PM Medical Record Number: [id number] Patient Account Number: [id number] Date of Birth/Sex: Treating RN: 03/10/1936 (87 y.o. [name] Primary Care Leighla Chestnutt: Renford Dills Other Clinician: Referring Kewana Sanon: Treating Kynli Chou/Extender: Layla Maw in Treatment: 0 Primary Learner Assessed: Patient Learning Preferences/Education Level/Primary Language Learning Preference: Explanation, Demonstration, Video, Printed Material Highest Education Level: College or Above Preferred Language: English Cognitive Barrier Language Barrier: No Translator Needed: No Memory Deficit: No Emotional Barrier: No Cultural/Religious Beliefs Affecting Medical Care: No Physical Barrier Impaired Vision: Yes Glasses Impaired Hearing: No Decreased Hand dexterity: No Knowledge/Comprehension Knowledge Level: Medium Comprehension Level: Medium Ability to understand written instructions: Medium Ability to understand verbal instructions: Medium Motivation Anxiety Level: Calm Cooperation: Cooperative Education Importance: Acknowledges Need Interest in Health Problems: Asks Questions Perception: Coherent Willingness to Engage in Self-Management Medium Activities: Readiness to Engage in Self-Management Medium Activities: Electronic Signature(s) Signed: 02/07/2023 3:24:43 PM By: Samuella Bruin Entered By: Samuella Bruin on 02/07/2023 09:52:41 -------------------------------------------------------------------------------- Fall Risk Assessment Details Patient Name: Date of  Service: [name], [name] 02/07/2023 12:45 PM Medical Record Number: [id number] Patient Account Number: [id number] Date of Birth/Sex: Treating RN: [birth year] (87 y.o. [name] Primary Care Endiya Klahr: Renford Dills Other Clinician: Referring Deondre Marinaro: Treating Shey Bartmess/Extender: Layla Maw in Treatment: 0 Fall Risk Assessment Items Have you had 2 or more falls in the last [address] MANOA, ANDRESS S ([id number]) [phone] [id number] Page 3 of 4 Have you had any fall that resulted in injury in the last 12 monthso 0 No FALLS RISK SCREEN History of falling - immediate or within 3 months 25 Yes Secondary diagnosis (Do you have 2 or more medical diagnoseso) 15 Yes Ambulatory aid None/bed rest/wheelchair/nurse 0 No  Crutches/cane/walker 15 Yes Furniture 0 No Intravenous therapy Access/Saline/Heparin Lock 0 No Gait/Transferring Normal/ bed rest/ wheelchair 0 Yes Weak (short steps with or without shuffle, stooped but able to lift head while walking, may seek 0 No support from furniture) Impaired (short steps with shuffle, may have difficulty arising from chair, head down, impaired 0 No balance) Mental Status Oriented to own ability 0 Yes Electronic Signature(s) Signed: 02/07/2023 3:24:43 PM By: Samuella Bruin Entered By: Samuella Bruin on 02/07/2023 09:53:05 -------------------------------------------------------------------------------- Foot Assessment Details Patient Name: Date of Service: [name], [name] 02/07/2023 12:45 PM Medical Record Number: [id number] Patient Account Number: [id number] Date of Birth/Sex: Treating RN: Nov 19, 1935 (87 y.o. [name] Primary Care Treasure Ochs: Renford Dills Other Clinician: Referring Maaz Spiering: Treating Chanti Golubski/Extender: Layla Maw in Treatment: 0 Foot Assessment Items Site Locations + = Sensation present, - = Sensation  absent, C = Callus, U = Ulcer R = Redness, W = Warmth, M = Maceration, PU = Pre-ulcerative lesion F = Fissure, S = Swelling, D = Dryness Assessment Right: Left: Other Deformity: No No Prior Foot Ulcer: No No Prior Amputation: No No Charcot Joint: No No Ambulatory Status: Ambulatory With Help Assistance Device: YARO, THONE ([id number]) [phone] [id number] Page 4 of 4 Gait: Steady Electronic Signature(s) Signed: 02/07/2023 3:24:43 PM By: Samuella Bruin Entered By: Samuella Bruin on 02/07/2023 09:53:30 -------------------------------------------------------------------------------- Nutrition Risk Screening Details Patient Name: Date of Service: [name], [name] 02/07/2023 12:45 PM Medical Record Number: [id number] Patient Account Number: [id number] Date of Birth/Sex: Treating RN: April 03, 1935 (87 y.o. [name] Primary Care Raenell Mensing: Renford Dills Other Clinician: Referring Devony Mcgrady: Treating Jennfier Abdulla/Extender: Layla Maw in Treatment: 0 Height (in): 66 Weight (lbs): 150 Body Mass Index (BMI): 24.2 Nutrition Risk Screening Items Score Screening NUTRITION RISK SCREEN: I have an illness or condition that made me change the kind and/or amount of food I eat 0 No I eat fewer than two meals per day 0 No I eat few fruits and vegetables, or milk products 0 No I have three or more drinks of beer, liquor or wine almost every day 0 No I have tooth or mouth problems that make it hard for me to eat 0 No I don't always have enough money to buy the food I need 0 No I eat alone most of the time 0 No I take three or more different prescribed or over-the-counter drugs a day 1 Yes Without wanting to, I have lost or gained 10 pounds in the last six months 0 No I am not always physically able to shop, cook and/or feed myself 0 No Nutrition Protocols Good Risk Protocol 0 No interventions needed Moderate Risk  Protocol High Risk Proctocol Risk Level: Good Risk Score: 1 Electronic Signature(s) Signed: 02/07/2023 3:24:43 PM By: Samuella Bruin Entered By: Samuella Bruin on 02/07/2023 09:53:22

## 2023-02-07 NOTE — Progress Notes (Addendum)
KEYNER, FINNIN ([id number]) [id number] Page 1 of 10 Visit Report for 02/07/2023 Chief Complaint Document Details Patient Name: Date of Service: [name], [name] 02/07/2023 12:45 PM Medical Record Number: [id number] Patient Account Number: [id number] Date of Birth/Sex: Treating RN: 03/21/1935 (87 y.o. M) Primary Care Provider: Renford [name] Other Clinician: Referring Provider: Treating Provider/Extender: [name] in Treatment: 0 Information Obtained from: Patient Chief Complaint Patient presents to the [facility] for HBO eval due to radiation cystitis with hematuria Electronic Signature(s) Signed: 02/07/2023 12:48:36 PM By: [name] Guess MD FACS Entered By: [name] on 02/07/2023 12:48:36 -------------------------------------------------------------------------------- HPI Details Patient Name: Date of Service: [name], [name] 02/07/2023 12:45 PM Medical Record Number: [id number] Patient Account Number: [id number] Date of Birth/Sex: Treating RN: 1935-10-31 (87 y.o. M) Primary Care Provider: Renford [name] Other Clinician: Referring Provider: Treating Provider/Extender: [name] in Treatment: 0 History of Present Illness HPI Description: ADMISSION 02/07/2023 This is an 87 year old man with a past medical history significant for prostate cancer. He underwent transurethral resection of the prostate in February 2017 and subsequently underwent radiation therapy to the prostate, seminal vesicles, and pelvic lymph nodes. He received an initial 45 Gy to the prostate seminal vesicles and pelvic lymph nodes in 25 fractions of 1.8 Gy. He also received a prostate-only boost to 75 Gy with 15 additional fractions of 2.0 Gy. In June 2024, he developed gross hematuria with clot retention. He was treated with continuous bladder irrigation and indwelling Foley catheter placement. He  also required cystoscopy with fulguration and clot evacuation both in June as well as more recently, in October of this year. 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 He has had enough blood loss to require multiple transfusions during his hospitalizations. Most recently, he had a suprapubic catheter placed by interventional radiology. This was placed on 24 January 2023. He has been referred by urology for consideration of hyperbaric oxygen therapy to treat his radiation cystitis with gross hematuria. Electronic Signature(s) Signed: 02/07/2023 1:58:04 PM By: [name] Guess MD FACS Previous Signature: 02/07/2023 1:02:37 PM Version By: [name] Guess MD FACS Previous Signature: 02/07/2023 12:55:13 PM Version By: [name] Guess MD FACS Entered By: [name] on 02/07/2023 13:58:04 Physical Exam Details -------------------------------------------------------------------------------- [name] ([id number]) [id number] Page 2 of 10 Patient Name: Date of Service: [name] 02/07/2023 12:45 PM Medical Record Number: [id number] Patient Account Number: [id number] Date of Birth/Sex: Treating RN: [birth year] (87 y.o. M) Primary Care Provider: Renford [name] Other Clinician: Referring Provider: Treating Provider/Extender: [name] in Treatment: 0 Constitutional Hypertensive, asymptomatic. . . . No acute distress. Ears, Nose, Mouth, and Throat The tympanic membranes are largely obscured by cerumen, but what I am able to visualize appears normal and pearly. Respiratory . Clear to auscultation bilaterally. Cardiovascular 2/6 systolic murmur--patient with bioprosthetic valve. Notes No wounds Electronic Signature(s) Signed: 02/07/2023 1:59:02 PM By: [name] Guess MD FACS Entered By: [name] on 02/07/2023  13:59:02 -------------------------------------------------------------------------------- Physician Orders Details Patient Name: Date of Service: [name], [name] 02/07/2023 12:45 PM Medical Record Number: [id number] Patient Account Number: [id number] Date of Birth/Sex: Treating RN: 1935/04/03 (87 y.o. [name] Primary Care Provider: Renford [name] Other Clinician: Referring Provider: Treating Provider/Extender: [name] in Treatment: 0 The following information was scribed by: [name] The information was scribed for: [name] Verbal / Phone Orders: No Diagnosis Coding ICD-10 Coding Code Description N30.41 Irradiation cystitis with hematuria C61 Malignant neoplasm  of prostate E11.9 Type 2 diabetes mellitus without complications Follow-up Appointments Return appointment in 1 month. - HBO check in with Dr. Lady [name] Hyperbaric Oxygen Therapy Evaluate for HBO Therapy Indication: - radiation cystitis If appropriate for treatment, begin HBOT per protocol: 2.0 ATA for 90 Minutes without A Breaks ir Total Number of Treatments: - 40 One treatments per day (delivered Monday through Friday unless otherwise specified in Special Instructions below): Finger stick Blood Glucose Pre- and Post- HBOT Treatment. Follow Hyperbaric Oxygen Glycemia Protocol Give two 4oz orange juices in addition to Glucerna when the glycemic protocol is used. A frin (Oxymetazoline HCL) 0.05% nasal spray - 1 spray in both nostrils daily as needed prior to HBO treatment for difficulty clearing ears GLYCEMIA INTERVENTIONS PROTOCOL PRE-HBO GLYCEMIA INTERVENTIONS ACTION INTERVENTION Obtain pre-HBO capillary blood glucose (ensure 1 physician order is in chart). A. Notify HBO physician and await physician orders. [name] ([id number]) [id number] Page 3 of 10 orders. 2 If result is 70 mg/dl or below: B. If the result meets  the hospital definition of a critical result, follow hospital policy. A. Give patient an 8 ounce Glucerna Shake, an 8 ounce Ensure, or 8 ounces of a Glucerna/Ensure equivalent dietary supplement*. B. Wait 30 minutes. If result is 71 mg/dl to 782 mg/dl: C. Retest patients capillary blood glucose (CBG). D. If result greater than or equal to 110 mg/dl, proceed with HBO. If result less than 110 mg/dl, notify HBO physician and consider holding HBO. If result is 131 mg/dl to 956 mg/dl: A. Proceed with HBO. A. Notify HBO physician and await physician orders. B. It is recommended to hold HBO and do If result is 250 mg/dl or greater: blood/urine ketone testing. C. If the result meets the hospital definition of a critical result, follow hospital policy. POST-HBO GLYCEMIA INTERVENTIONS ACTION INTERVENTION Obtain post HBO capillary blood glucose (ensure 1 physician order is in chart). A. Notify HBO physician and await physician orders. 2 If result is 70 mg/dl or below: B. If the result meets the hospital definition of a critical result, follow hospital policy. A. Give patient an 8 ounce Glucerna Shake, an 8 ounce Ensure, or 8 ounces of a Glucerna/Ensure equivalent dietary supplement*. B. Wait 15 minutes for symptoms of If result is 71 mg/dl to 213 mg/dl: hypoglycemia (i.e. nervousness, anxiety, sweating, chills, clamminess, irritability, confusion, tachycardia or dizziness). C. If patient asymptomatic, discharge patient. If patient symptomatic, repeat capillary blood glucose (CBG) and notify HBO physician. If result is 101 mg/dl to 086 mg/dl: A. Discharge patient. A. Notify HBO physician and await physician orders. B. It is recommended to do blood/urine ketone If result is 250 mg/dl or greater: testing. C. If the result meets the hospital definition of a critical result, follow hospital policy. *Juice or candies are NOT equivalent products. If patient refuses the Glucerna or  Ensure, please consult the hospital dietitian for an appropriate substitute. Electronic Signature(s) Signed: 02/07/2023 1:59:26 PM By: [name] Guess MD FACS Entered By: [name] on 02/07/2023 13:59:26 -------------------------------------------------------------------------------- Problem List Details Patient Name: Date of Service: [name], [name] 02/07/2023 12:45 PM Medical Record Number: [id number] Patient Account Number: [id number] Date of Birth/Sex: Treating RN: 01/21/1936 (87 y.o. M) Primary Care Provider: Renford [name] Other Clinician: Referring Provider: Treating Provider/Extender: [name] in Treatment: 0 Active Problems ICD-10 Encounter Code Description Active Date MDM Diagnosis N30.41 Irradiation cystitis with hematuria 02/07/2023 No Yes C61 Malignant neoplasm of prostate 02/07/2023 No Yes ROCH, DRUIN ([id number]) [phone].pdf Page 4 of  10 E11.9 Type 2 diabetes mellitus without complications 02/07/2023 No Yes Inactive Problems Resolved Problems Electronic Signature(s) Signed: 02/07/2023 12:48:12 PM By: [name] Guess MD FACS Entered By: [name] on 02/07/2023 12:48:11 -------------------------------------------------------------------------------- Progress Note Details Patient Name: Date of Service: [name], [name] 02/07/2023 12:45 PM Medical Record Number: [id number] Patient Account Number: [id number] Date of Birth/Sex: Treating RN: [birth year] (87 y.o. M) Primary Care Provider: Renford [name] Other Clinician: Referring Provider: Treating Provider/Extender: [name] in Treatment: 0 Subjective Chief Complaint Information obtained from Patient Patient presents to the Wound Care center for HBO eval due to radiation cystitis with hematuria History of Present Illness (HPI) ADMISSION 02/07/2023 This is an 87 year old man with a past medical history  significant for prostate cancer. He underwent transurethral resection of the prostate in February 2017 and subsequently underwent radiation therapy to the prostate, seminal vesicles, and pelvic lymph nodes. He received an initial 45 Gy to the prostate seminal vesicles and pelvic lymph nodes in 25 fractions of 1.8 Gy. He also received a prostate-only boost to 75 Gy with 15 additional fractions of 2.0 Gy. In June 2024, he developed gross hematuria with clot retention. He was treated with continuous bladder irrigation and indwelling Foley catheter placement. He also required cystoscopy with fulguration and clot evacuation both in June as well as more recently, in October of this year. 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 He has had enough blood loss to require multiple transfusions during his hospitalizations. Most recently, he had a suprapubic catheter placed by interventional radiology. This was placed on 24 January 2023. He has been referred by urology for consideration of hyperbaric oxygen therapy to treat his radiation cystitis with gross hematuria. Patient History Information obtained from Patient, Chart. Allergies No Known Allergies Family History Cancer - Mother,Father, Diabetes - Mother,Maternal Grandparents, Heart Disease - Mother,Maternal Grandparents, Stroke - Father, No family history of Hereditary Spherocytosis, Hypertension, Kidney Disease, Lung Disease, Seizures, Thyroid Problems, Tuberculosis. Social History Former smoker - [phone], Marital Status - Married, Alcohol Use - Never, Drug Use - No History, Caffeine Use - Daily. Medical History Eyes Patient has history of Cataracts - removed Hematologic/Lymphatic Patient has history of Anemia Respiratory Patient has history of Sleep Apnea Cardiovascular Patient has history of Angina, Arrhythmia - Atrial flutter, Coronary Artery Disease, Hypertension, Peripheral Arterial Disease Endocrine Patient  has history of Type II Diabetes Oncologic Patient has history of Received Radiation Patient is treated with Insulin, Oral Agents. Blood sugar is tested. Hospitalization/Surgery History - Cystoscopy with fulgeration. - Esophagogastroduodenoscopy (egd) with propofol. - T without cardioversion. - A-flutter ee IZAIAS, MICHALSKI ([id number]) [id number] Page 5 of 10 ablation. - Transurethral resection of prostate. - Left heart catheterization with coronary angiogram. - Coronary artery bypass graft. - Aortic valve replacement. - Mitral valve repair. - Shoulder arthroscopy. - Coronary angioplasty with stent placement. - Hematoma evacuation. - Carotid endarterectomy. - Anterior cervical decomp/discectomy fusion. - Posterior fusion lumbar spine. - Lumbar disc surgery. - Cervical disc surgery. - Adenoidectomy and T onsillectomy. - Appendectomy. - Prostate biopsy. - Skin cancer excision. Medical A Surgical History Notes nd Respiratory Acute hypoxic respiratory failure Cardiovascular S/P mitral valve replacement with bioprosthetic valve Carotid artery occlusion Valvular heart disease Chronic heart failure with preserved ejection fraction Gastrointestinal GERD (gastroesophageal reflux disease) History of esophageal stricture Genitourinary Bladder outlet obstruction Kidney stones Gross hematuria Acute cystitis with hematuria AKI Musculoskeletal Degenerative disk disease Spinal stenosis Neurologic stroke Oncologic Skin cancer of nose prostate cancer Skin cancer of trunk  Radiation cystitis Psychiatric Major depression Review of Systems (ROS) Constitutional Symptoms (General Health) Denies complaints or symptoms of Fatigue, Fever, Chills, Marked Weight Change. Eyes Complains or has symptoms of Glasses / Contacts. Ear/Nose/Mouth/Throat Denies complaints or symptoms of Chronic sinus problems or rhinitis. Genitourinary supra pubic catheter placement Integumentary  (Skin) Denies complaints or symptoms of Wounds. Objective Constitutional Hypertensive, asymptomatic. No acute distress. Vitals Time Taken: 12:50 PM, Height: 66 in, Source: Stated, Weight: 150 lbs, Source: Stated, BMI: 24.2, Temperature: 97.7 F, Pulse: 60 bpm, Respiratory Rate: 18 breaths/min, Blood Pressure: 169/72 mmHg, Capillary Blood Glucose: 150 mg/dl. Ears, Nose, Mouth, and Throat The tympanic membranes are largely obscured by cerumen, but what I am able to visualize appears normal and pearly. Respiratory Clear to auscultation bilaterally. Cardiovascular 2/6 systolic murmur--patient with bioprosthetic valve. General Notes: No wounds Assessment Active Problems ICD-10 Irradiation cystitis with hematuria Malignant neoplasm of prostate Type 2 diabetes mellitus without complications HBO Evaluation Hx of prior radiation After undergoing surgery for prostate cancer (TURP), the patient received an initial 45 Gray in 25 fractions of 1.8 Wallace Cullens to the prostate, seminal vesicles, and pelvic lymph nodes. The prostate only was boosted to 75 Gray with 15 additional fractions of 2.0 Wallace Cullens. The treatment was performed between 06/29/2015 and 08/26/2015. 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 The most recent of these was performed on 09 January 2023. Description of symptoms The patient first presented to the emergency department in July 2024 with hematuria and urinary retention. He underwent cystoscopy and fulguration of the bladder mucosa during that hospitalization. He has had additional ER visits for urinary retention and required hospital admission for continuous bladder irrigation and blood transfusions due to the acute blood loss anemia that occurred as a result of his hematuria.  Most recently, on January 09, 2023, he underwent cystoscopy with evacuation of 250 cc old clot. The bladder was again noted to have mucosal changes consistent with radiation cystitis and this was fulgurated. Due to the frequent Foley catheterization and self intermittent catheterization, he was also noted to have necrotic prostatic urethral tissue sloughing and bleeding. For this reason, he has required suprapubic catheter placement, which was performed on January 29, 2023. CT scan results A CT scan of the abdomen and pelvis was performed on January 07, 2023. He was found to have a small amount of high density material dependent within the bladder lumen which was felt to likely represent blood clot. MRI results n/a X-Ray results A plain chest x-ray was reviewed and there were no pulmonary findings concerning for COPD. Bone scan results n/a PET scan n/a Other tests His most recent CBC was reviewed. Hemoglobin was 9.6. Coagulation studies demonstrated a normal PT and INR. His albumin has been chronically low since he developed hematuria with the most recent finding being 2.8 on January 11, 2023. Plan of care/Summary This is an 87 year old man with a history of prostate cancer treated both surgically and with external beam radiation. He received a total of 75 Gy and has subsequently developed radiation cystitis with hematuria. He is currently being managed with a suprapubic catheter. He has undergone 2 separate cystoscopies with clot evacuation and fulguration. He has also required blood transfusions to maintain normal range hemoglobin and hematocrit. He continues on finasteride per urology along with tamsulosin. I propose a course of 40 treatments of hyperbaric oxygen therapy  at 2.0 atm. No air breaks. Standard glycemic protocol. Addendum - 02/09/2023 : The long-term measurable goals for hyperbaric oxygen therapy include resolution of hematuria and urinary retention secondary to  clot formation as well as eliminate the need for multiple procedures to treat the hematuria, such as operative cystoscopy and fulguration. Conventional management is already underway which includes the suprapubic catheter that has already been placed and ongoing finasteride and tamsulosin use. He continues to be monitored by urology. Plan Follow-up Appointments: Return appointment in 1 month. - HBO check in with Dr. Lady [name] Hyperbaric Oxygen Therapy: CHEE, MEASEL ([id number]) [phone].pdf Page 7 of 10 Evaluate for HBO Therapy Indication: - radiation cystitis If appropriate for treatment, begin HBOT per protocol: 2.0 ATA for 90 Minutes without Air Breaks T Number of Treatments: - 40 otal One treatments per day (delivered Monday through Friday unless otherwise specified in Special Instructions below): Finger stick Blood Glucose Pre- and Post- HBOT Treatment. Follow Hyperbaric Oxygen Glycemia Protocol Give two 4oz orange juices in addition to Glucerna when the glycemic protocol is used. Afrin (Oxymetazoline HCL) 0.05% nasal spray - 1 spray in both nostrils daily as needed prior to HBO treatment for difficulty clearing ears 02/07/2023: This is an 87 year old man with a history of prostate cancer treated with TURP and external beam radiation therapy. He received 75 Gy total to the prostate. The seminal vesicles and pelvic lymph nodes were also treated. He has developed hematuria to the point where he has required blood transfusions, continuous bladder irrigation as well as as 2 cystoscopic fulgurations. He currently has a suprapubic catheter in situ. He would benefit from hyperbaric oxygen therapy to treat the radiation cystitis with hematuria. I propose a course of 40 treatments at 2.0 atm without any air breaks. I have reviewed recent EKG and chest x-rays I do not see any contraindication on those studies. His most recent CBC was 9.6 on 6 November. His most recent  hemoglobin A1c was 6.7%. I think he is an acceptable risk for treatment. We will proceed with obtaining authorization and begin therapy at the soonest possible date. Electronic Signature(s) Signed: 02/13/2023 1:35:07 PM By: Shawn Stall RN, BSN Signed: 02/13/2023 1:54:30 PM By: [name] Guess MD FACS Previous Signature: 02/09/2023 10:17:43 AM Version By: [name] Guess MD FACS Previous Signature: 02/09/2023 8:16:27 AM Version By: [name] Guess MD FACS Previous Signature: 02/07/2023 2:16:21 PM Version By: [name] Guess MD FACS Previous Signature: 02/07/2023 2:03:11 PM Version By: [name] Guess MD FACS Entered By: Shawn Stall on 02/13/2023 13:33:49 -------------------------------------------------------------------------------- HxROS Details Patient Name: Date of Service: [name], [name] 02/07/2023 12:45 PM Medical Record Number: [id number] Patient Account Number: [id number] Date of Birth/Sex: Treating RN: 03-Aug-1935 (87 y.o. [name] Primary Care Provider: Renford [name] Other Clinician: Referring Provider: Treating Provider/Extender: [name] in Treatment: 0 Information Obtained From Patient Chart Constitutional Symptoms (General Health) Complaints and Symptoms: Negative for: Fatigue; Fever; Chills; Marked Weight Change Eyes Complaints and Symptoms: Positive for: Glasses / Contacts Medical History: Positive for: Cataracts - removed Ear/Nose/Mouth/Throat Complaints and Symptoms: Negative for: Chronic sinus problems or rhinitis Integumentary (Skin) Complaints and Symptoms: Negative for: Wounds Hematologic/Lymphatic Medical History: Positive for: Anemia Respiratory Medical HistoryBRAILEN, BRAXTON ([id number]) [phone].pdf Page 8 of 10 Positive for: Sleep Apnea Past Medical History Notes: Acute hypoxic respiratory failure Cardiovascular Medical History: Positive for: Angina;  Arrhythmia - Atrial flutter; Coronary Artery Disease; Hypertension; Peripheral Arterial Disease Past Medical History Notes: S/P mitral valve replacement with bioprosthetic valve Carotid artery occlusion  Valvular heart disease Chronic heart failure with preserved ejection fraction Gastrointestinal Medical History: Past Medical History Notes: GERD (gastroesophageal reflux disease) History of esophageal stricture Endocrine Medical History: Positive for: Type II Diabetes Time with diabetes: 10 yrs Treated with: Insulin, Oral agents Blood sugar tested every day: Yes Tested : x3 a day Genitourinary Complaints and Symptoms: Review of System Notes: supra pubic catheter placement Medical History: Past Medical History Notes: Bladder outlet obstruction Kidney stones Gross hematuria Acute cystitis with hematuria AKI Immunological Musculoskeletal Medical History: Past Medical History Notes: Degenerative disk disease Spinal stenosis Neurologic Medical History: Past Medical History Notes: stroke Oncologic Medical History: Positive for: Received Radiation Past Medical History Notes: Skin cancer of nose prostate cancer Skin cancer of trunk Radiation cystitis Psychiatric Medical History: Past Medical History Notes: Major depression HBO Extended History Items Eyes: Cataracts Immunizations Pneumococcal VaccineOCTAVIS, LIETZAU ([id number]) [phone].pdf Page 9 of 10 Received Pneumococcal Vaccination: Yes Received Pneumococcal Vaccination On or After 60th Birthday: Yes Implantable Devices None Hospitalization / Surgery History Type of Hospitalization/Surgery Cystoscopy with fulgeration Esophagogastroduodenoscopy (egd) with propofol T without cardioversion ee A-flutter ablation Transurethral resection of prostate Left heart catheterization with coronary angiogram Coronary artery bypass graft Aortic valve replacement Mitral valve  repair Shoulder arthroscopy Coronary angioplasty with stent placement Hematoma evacuation Carotid endarterectomy Anterior cervical decomp/discectomy fusion Posterior fusion lumbar spine Lumbar disc surgery Cervical disc surgery Adenoidectomy and T onsillectomy Appendectomy Prostate biopsy Skin cancer excision Family and Social History Cancer: Yes - Mother,Father; Diabetes: Yes - Mother,Maternal Grandparents; Heart Disease: Yes - Mother,Maternal Grandparents; Hereditary Spherocytosis: No; Hypertension: No; Kidney Disease: No; Lung Disease: No; Seizures: No; Stroke: Yes - Father; Thyroid Problems: No; Tuberculosis: No; Former smoker - [phone]; Marital Status - Married; Alcohol Use: Never; Drug Use: No History; Caffeine Use: Daily; Financial Concerns: No; Food, Clothing or Shelter Needs: No; Support System Lacking: No; Transportation Concerns: No Electronic Signature(s) Signed: 02/07/2023 3:24:43 PM By: [name] Signed: 02/07/2023 3:57:12 PM By: [name] Guess MD FACS Entered By: [name] on 02/07/2023 12:58:04 -------------------------------------------------------------------------------- SuperBill Details Patient Name: Date of Service: [name], [name] 02/07/2023 Medical Record Number: [id number] Patient Account Number: [id number] Date of Birth/Sex: Treating RN: 1935/12/24 (87 y.o. [name] Primary Care Provider: Renford [name] Other Clinician: Referring Provider: Treating Provider/Extender: [name] in Treatment: 0 Diagnosis Coding ICD-10 Codes Code Description N30.41 Irradiation cystitis with hematuria C61 Malignant neoplasm of prostate E11.9 Type 2 diabetes mellitus without complications Facility Procedures : CPT4 Code: 23557322 Description: 99213 - WOUND CARE VISIT-LEV 3 EST PT Modifier: Quantity: 1 Physician Procedures : CPT4 Code Description Modifier ROB, NIDIFFER ([id number])  [id number]. [phone] - WC PHYS LEVEL 4 - NEW PT 1 ICD-10 Diagnosis Description N30.41 Irradiation cystitis with hematuria C61 Malignant neoplasm of prostate  E11.9 Type 2 diabetes mellitus without complications Quantity: pdf Page 10 of 10 Electronic Signature(s) Signed: 02/07/2023 2:03:42 PM By: [name] Guess MD FACS Entered By: [name] on 02/07/2023 14:03:42

## 2023-02-21 ENCOUNTER — Encounter (HOSPITAL_BASED_OUTPATIENT_CLINIC_OR_DEPARTMENT_OTHER): Payer: Medicare Other | Attending: Internal Medicine | Admitting: Internal Medicine

## 2023-02-21 DIAGNOSIS — L598 Other specified disorders of the skin and subcutaneous tissue related to radiation: Secondary | ICD-10-CM | POA: Diagnosis not present

## 2023-02-21 DIAGNOSIS — N3041 Irradiation cystitis with hematuria: Secondary | ICD-10-CM | POA: Diagnosis not present

## 2023-02-21 DIAGNOSIS — C61 Malignant neoplasm of prostate: Secondary | ICD-10-CM | POA: Diagnosis not present

## 2023-02-21 DIAGNOSIS — E119 Type 2 diabetes mellitus without complications: Secondary | ICD-10-CM | POA: Insufficient documentation

## 2023-02-21 LAB — GLUCOSE, CAPILLARY: Glucose-Capillary: 148 mg/dL — ABNORMAL HIGH (ref 70–99)

## 2023-02-22 ENCOUNTER — Encounter (HOSPITAL_BASED_OUTPATIENT_CLINIC_OR_DEPARTMENT_OTHER): Payer: Medicare Other | Admitting: Internal Medicine

## 2023-02-22 ENCOUNTER — Other Ambulatory Visit: Payer: Self-pay | Admitting: Radiology

## 2023-02-22 DIAGNOSIS — L598 Other specified disorders of the skin and subcutaneous tissue related to radiation: Secondary | ICD-10-CM | POA: Diagnosis not present

## 2023-02-22 DIAGNOSIS — N3041 Irradiation cystitis with hematuria: Secondary | ICD-10-CM | POA: Diagnosis not present

## 2023-02-22 DIAGNOSIS — E119 Type 2 diabetes mellitus without complications: Secondary | ICD-10-CM | POA: Diagnosis not present

## 2023-02-22 DIAGNOSIS — C61 Malignant neoplasm of prostate: Secondary | ICD-10-CM | POA: Diagnosis not present

## 2023-02-22 LAB — GLUCOSE, CAPILLARY
Glucose-Capillary: 125 mg/dL — ABNORMAL HIGH (ref 70–99)
Glucose-Capillary: 159 mg/dL — ABNORMAL HIGH (ref 70–99)
Glucose-Capillary: 126 mg/dL — ABNORMAL HIGH (ref 70–99)

## 2023-02-22 NOTE — Progress Notes (Addendum)
ABHILASH, EMENS ([id number]) [id number] Page 1 of 3 Visit Report for 02/21/2023 HBO Details Patient Name: Date of Service: [name], [name] 02/21/2023 12:30 PM Medical Record Number: [id number] Patient Account Number: [id number] Date of Birth/Sex: Treating RN: May 31, 1935 (87 y.o. 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 HBO Treatment Details Treatment Number: 1 Patient Type: Outpatient Chamber Type: Monoplace Chamber Serial #: [id number] Treatment Protocol: 2.0 ATA with 90 minutes oxygen, and no air breaks Treatment Details Compression Rate Down: 1.0 psi / minute De-Compression Rate Up: 1.0 psi / minute Air breaks and breathing Decompress Decompress Compress Tx Pressure Begins Reached periods Begins Ends (leave unused spaces blank) Chamber Pressure (ATA 1 2 ------2 1 ) Clock Time (24 hr) 14:17 14:30 - - - - - - 16:00 16:21 Treatment Length: 124 (minutes) Treatment Segments: 4 Vital Signs Capillary Blood Glucose Reference Range: 80 - 120 mg / dl HBO Diabetic Blood Glucose Intervention Range: <131 mg/dl or >308 mg/dl Type: Time Vitals Blood Pulse: Respiratory Temperature: Capillary Blood Glucose Pulse Action Taken: Pressure: Rate: Glucose (mg/dl): Meter #: Oximetry (%) Taken: Pre 13:30 148/59 60 18 98.1 148 none per protocol Post 16:24 170/67 55 18 97.7 125 asymptomatic for bradycardia Treatment Response Treatment Toleration: Well Treatment Completion Status: Treatment Completed without Adverse Event Treatment Notes [name] arrived with diastolic BP of 59 mmHg. He was  asymptomatic for hypotension. He stated that he ate breakfast and lunch. He prepared for treatment. Dr. Leanord Hawking had preliminary examination of patient prior to treatment. After performing a safety check and reviewing orientation information, patient was placed in the chamber which was compressed with 100% oxygen at the rate of 1 psi/min with 480 L/min (Chamber 4). He demonstrated understanding of ear equalization during orientation and during travel. He tolerated the treatment and subsequent decompression of the chamber at the rate of 1 psi/min. He denied any issues with ear equalization and/or pain associated with barotrauma. Post-treatment vital signs were normal per protocol except heart rate of 55. He denied any symptoms of bradycardia. He was stable upon discharge. [name] Notes Patient's first rx. Rest and Cardiac exams normal. tympanic membranes wnl. Patient tolerated rx well Physician HBO Attestation: I certify that I supervised this HBO treatment in accordance with Medicare guidelines. A trained emergency response team is readily available per Yes hospital policies and procedures. Continue HBOT as ordered. [address] [name], [name] ([id number]) [id number] Page 2 of 3 Electronic Signature(s) Signed: 02/22/2023 4:45:28 AM By: Baltazar Najjar MD Previous Signature: 02/21/2023 7:26:15 PM Version By: Haywood Pao CHT EMT BS , , Previous Signature: 02/21/2023 6:33:10 PM Version By: Haywood Pao CHT EMT BS , , Entered By: Baltazar Najjar on 02/22/2023 01:43:04 -------------------------------------------------------------------------------- HBO Safety Checklist Details Patient Name: Date of Service: [name], [name] 02/21/2023 12:30 PM Medical Record Number: [id number] Patient Account Number: [id number] Date of Birth/Sex: Treating RN: 1935-06-27 (87 y.o. Tammy Sours Primary Care Dejae Bernet: Renford Dills Other Clinician: Haywood Pao Referring  Torry Istre: Treating Jahaziel Francois/Extender: Gwyneth Revels in Treatment: 2 HBO Safety Checklist Items Safety Checklist Consent Form Signed Patient voided / foley secured and emptied When did you last eato 1130 -oatmeal AM spaghetti most recent Last dose of injectable or oral agent AM medication metformin/insulin  Ostomy pouch emptied and vented if applicable NA Suprapubic catheter, Freestyle Libre 2 and3 on All implantable devices assessed, documented and approved approved list Intravenous access site secured and place NA Valuables secured Linens and cotton and cotton/polyester blend (less than 51% polyester) Personal oil-based products / skin lotions / body lotions removed Wigs or hairpieces removed NA Smoking or tobacco materials removed NA Books / newspapers / magazines / loose paper removed Cologne, aftershave, perfume and deodorant removed Jewelry removed (may wrap wedding band) Make-up removed NA Hair care products removed Libre Freestyle 2 and 3 Sensor Approved, other Battery operated devices (external) removed electronics removed. Heating patches and chemical warmers removed Titanium eyewear removed Nail polish cured greater than 10 hours NA Casting material cured greater than 10 hours NA Hearing aids removed NA Loose dentures or partials removed NA Prosthetics have been removed NA Patient demonstrates correct use of air break device (if applicable) Patient concerns have been addressed Patient grounding bracelet on and cord attached to chamber Specifics for Inpatients (complete in addition to above) Medication sheet sent with patient NA Intravenous medications needed or due during therapy sent with patient NA Drainage tubes (e.g. nasogastric tube or chest tube secured and vented) NA Endotracheal or Tracheotomy tube secured NA Cuff deflated of air and inflated with saline NA Airway suctioned NA Notes Paper version used prior to  treatment start. Electronic Signature(s) Signed: 02/21/2023 7:41:22 PM By: Haywood Pao CHT EMT BS , , Previous Signature: 02/21/2023 7:25:36 PM Version By: Haywood Pao CHT EMT BS , , Previous Signature: 02/21/2023 6:24:20 PM Version By: Haywood Pao CHT EMT BS , , [place], [name] ([id number]) [id number] Page 3 of 3 Entered By: Haywood Pao on 02/21/2023 16:41:22

## 2023-02-22 NOTE — Progress Notes (Signed)
CHEVALIER, KARNITZ ([id number]) [id number] Page 1 of 1 Visit Report for 02/21/2023 SuperBill Details Patient Name: Date of Service: [name], [name] 02/21/2023 Medical Record Number: [id number] Patient Account Number: [id number] Date of Birth/Sex: Treating RN: 09/13/1935 (87 y.o. Tammy Sours Primary Care Provider: Renford Dills Other Clinician: Haywood Pao Referring Provider: Treating Provider/Extender: Gwyneth Revels in Treatment: 2 Diagnosis Coding ICD-10 Codes Code Description N30.41 Irradiation cystitis with hematuria C61 Malignant neoplasm of prostate E11.9 Type 2 diabetes mellitus without complications Facility Procedures CPT4 Code Description Modifier Quantity [id number]-(Facility Use Only) HBOT full body chamber, , 4 ICD-10 Diagnosis Description N30.41 Irradiation cystitis with hematuria C61 Malignant neoplasm of prostate E11.9 Type 2 diabetes mellitus without complications Physician Procedures Quantity CPT4 Code Description Modifier [id number] - WC PHYS HYPERBARIC OXYGEN THERAPY 1 ICD-10 Diagnosis Description N30.41 Irradiation cystitis with hematuria C61 Malignant neoplasm of prostate E11.9 Type 2 diabetes mellitus without complications Electronic Signature(s) Signed: 02/21/2023 7:27:01 PM By: Haywood Pao CHT EMT BS , , Signed: 02/22/2023 4:45:28 AM By: Baltazar Najjar MD Previous Signature: 02/21/2023 6:33:41 PM Version By: Haywood Pao CHT EMT BS , , Entered By: Haywood Pao on 02/21/2023 16:27:00

## 2023-02-22 NOTE — Progress Notes (Signed)
CULLEY, PLANO ([id number]) [phone].pdf Page 1 of 2 Visit Report for 02/21/2023 Arrival Information Details Patient Name: Date of Service: [name], [name] 02/21/2023 12:30 PM Medical Record Number: [id number] Patient Account Number: [id number] Date of Birth/Sex: Treating RN: 04-Sep-1935 (87 y.o. [name], Millard.Loa Primary Care Latarsha Zani: Renford Dills Other Clinician: Haywood Pao Referring Karmello Abercrombie: Treating Aurore Redinger/Extender: Gwyneth Revels in Treatment: 2 Visit Information History Since Last Visit All ordered tests and consults were completed: Yes Patient Arrived: Ambulatory Added or deleted any medications: No Arrival Time: 18:17 Any new allergies or adverse reactions: No Accompanied By: spouse Had a fall or experienced change in No Transfer Assistance: None activities of daily living that may affect Patient Identification Verified: Yes risk of falls: Secondary Verification Process Completed: Yes Signs or symptoms of abuse/neglect since last visito No Hospitalized since last visit: No Implantable device outside of the clinic excluding No cellular tissue based products placed in the center since last visit: Pain Present Now: No Electronic Signature(s) Signed: 02/21/2023 6:17:57 PM By: Haywood Pao CHT EMT BS , , Entered By: Haywood Pao on 02/21/2023 18:17:57 -------------------------------------------------------------------------------- Encounter Discharge Information Details Patient Name: Date of Service: [name] American, [name] 02/21/2023 12:30 PM Medical Record Number: [id number] Patient Account Number: [id number] Date of Birth/Sex: Treating RN: 1935/10/19 (87 y.o. Tammy Sours Primary Care Marypat Kimmet: Renford Dills Other Clinician: Haywood Pao Referring Ainara Eldridge: Treating Daquisha Clermont/Extender: Gwyneth Revels in Treatment: 2 Encounter Discharge Information Items Discharge  Condition: Stable Ambulatory Status: Ambulatory Discharge Destination: Home Transportation: Private Auto Accompanied By: spouse Schedule Follow-up Appointment: No Clinical Summary of Care: Electronic Signature(s) Signed: 02/21/2023 6:34:32 PM By: Haywood Pao CHT EMT BS , , Entered By: Haywood Pao on 02/21/2023 18:34:31 Holzman, [name] ([id number]) [id number] Page 2 of 2 -------------------------------------------------------------------------------- Vitals Details Patient Name: Date of Service: [name] 02/21/2023 12:30 PM Medical Record Number: [id number] Patient Account Number: [id number] Date of Birth/Sex: Treating RN: Oct 14, 1935 (87 y.o. Tammy Sours Primary Care Devlin Mcveigh: Renford Dills Other Clinician: Haywood Pao Referring Edith Lord: Treating Sayge Brienza/Extender: Gwyneth Revels in Treatment: 2 Vital Signs Time Taken: 13:30 Temperature (F): 98.1 Height (in): 66 Pulse (bpm): 60 Weight (lbs): 150 Respiratory Rate (breaths/min): 18 Body Mass Index (BMI): 24.2 Blood Pressure (mmHg): 148/59 Capillary Blood Glucose (mg/dl): 220 Reference Range: 80 - 120 mg / dl Electronic Signature(s) Signed: 02/21/2023 6:22:21 PM By: Haywood Pao CHT EMT BS , , Entered By: Haywood Pao on 02/21/2023 18:22:21

## 2023-02-23 ENCOUNTER — Other Ambulatory Visit: Payer: Self-pay | Admitting: Radiology

## 2023-02-26 ENCOUNTER — Encounter (HOSPITAL_COMMUNITY): Payer: Self-pay

## 2023-02-26 ENCOUNTER — Ambulatory Visit (HOSPITAL_COMMUNITY)
Admission: RE | Admit: 2023-02-26 | Discharge: 2023-02-26 | Disposition: A | Discharge status: Home / Self Care | Payer: Medicare Other | Source: Ambulatory Visit | Attending: Nurse Practitioner | Admitting: Nurse Practitioner

## 2023-02-26 ENCOUNTER — Other Ambulatory Visit: Payer: Self-pay

## 2023-02-26 DIAGNOSIS — R339 Retention of urine, unspecified: Secondary | ICD-10-CM | POA: Diagnosis not present

## 2023-02-26 DIAGNOSIS — R310 Gross hematuria: Secondary | ICD-10-CM | POA: Insufficient documentation

## 2023-02-26 DIAGNOSIS — Z87891 Personal history of nicotine dependence: Secondary | ICD-10-CM | POA: Diagnosis not present

## 2023-02-26 DIAGNOSIS — Z466 Encounter for fitting and adjustment of urinary device: Secondary | ICD-10-CM | POA: Diagnosis not present

## 2023-02-26 DIAGNOSIS — Z435 Encounter for attention to cystostomy: Secondary | ICD-10-CM | POA: Diagnosis not present

## 2023-02-26 DIAGNOSIS — Z8546 Personal history of malignant neoplasm of prostate: Secondary | ICD-10-CM | POA: Insufficient documentation

## 2023-02-26 HISTORY — PX: IR CATHETER TUBE CHANGE: IMG717

## 2023-02-26 LAB — GLUCOSE, CAPILLARY
Glucose-Capillary: 148 mg/dL — ABNORMAL HIGH (ref 70–99)
Glucose-Capillary: 148 mg/dL — ABNORMAL HIGH (ref 70–99)

## 2023-02-26 MED ORDER — IOHEXOL 300 MG/ML  SOLN
50.0000 mL | Freq: Once | INTRAMUSCULAR | Status: AC | PRN
  Administered 2023-02-26: 15 mL

## 2023-02-26 MED ORDER — LIDOCAINE HCL 1 % IJ SOLN
INTRAMUSCULAR | Status: AC
  Filled 2023-02-26: qty 20

## 2023-02-26 MED ORDER — MIDAZOLAM HCL 2 MG/2ML IJ SOLN
INTRAMUSCULAR | Status: AC
  Filled 2023-02-26: qty 2

## 2023-02-26 MED ORDER — FENTANYL CITRATE (PF) 100 MCG/2ML IJ SOLN
INTRAMUSCULAR | Status: AC
  Filled 2023-02-26: qty 2

## 2023-02-26 MED ORDER — MIDAZOLAM HCL 2 MG/2ML IJ SOLN
INTRAMUSCULAR | Status: AC | PRN
  Administered 2023-02-26: .5 mg via INTRAVENOUS
  Administered 2023-02-26: 1 mg via INTRAVENOUS

## 2023-02-26 MED ORDER — LIDOCAINE HCL 1 % IJ SOLN
20.0000 mL | Freq: Once | INTRAMUSCULAR | Status: AC
  Administered 2023-02-26: 8 mL

## 2023-02-26 MED ORDER — FENTANYL CITRATE (PF) 100 MCG/2ML IJ SOLN
INTRAMUSCULAR | Status: AC | PRN
  Administered 2023-02-26: 50 ug via INTRAVENOUS
  Administered 2023-02-26: 25 ug via INTRAVENOUS

## 2023-02-26 MED ORDER — LIDOCAINE VISCOUS HCL 2 % MT SOLN
OROMUCOSAL | Status: AC
  Filled 2023-02-26: qty 15

## 2023-02-26 NOTE — Procedures (Signed)
Interventional Radiology Procedure Note  Procedure:   Image guided upsize of supra-pubic catheter. New 19F council tip balloon retention.   Complications: None Recommendations:  - 1 hr recovery, then DC home - To gravity - Routine care - Can proceed with bedside exchanges   Signed,  Yvone Neu. Loreta Ave, DO

## 2023-02-26 NOTE — Progress Notes (Signed)
Patient and wife was given discharge instructions. Both verbalized understanding. 

## 2023-02-26 NOTE — H&P (Signed)
Chief Complaint: Patient was seen in consultation today for urinary retention  Referring Physician(s): Dr  Gaspar Skeeters  Supervising Physician: Gilmer Mor  Patient Status: [facility] - Out-pt  History of Present Illness: [name] is a 87 y.o. male  with history of prostate cancer s/p TURP and radiation now with gross hematuria, urinary retention.  Patient known to IR from recent initial suprapubic catheter placement by Dr. Jackelyn Hoehn in IR.  He now presents for routine exchange and upsize from 14Fr to [employer] tip.   Patient presents in his usual state of health today.  He is accompanied by his wife. He states his SPT has been functioning well without issue.  He is understanding of the goals of hte procedure and is agreeable to proceed.   He has been NPO today.    Past Medical History:  Diagnosis Date   Anemia    Anginal pain (HCC)    Atrial flutter (HCC)    Bladder outlet obstruction    Carotid artery occlusion    Coronary artery disease    post stents; prior CABG; s/p cath January 2014 with 2 VD, patent SVG to OM3 and patent SVG to PL patent   Degenerative disk disease    GERD (gastroesophageal reflux disease)    History of esophageal stricture    "has it stretched q once in awhile" (03/26/2012"   Hyperlipidemia    Hypertension    Kidney stones    Major depression    OSA (obstructive sleep apnea)    "have a mask; haven't used one in years" (03/26/2012)   Prostate cancer (HCC)    S/P mitral valve replacement with bioprosthetic valve 02/20/2014   Skin cancer of nose 2012   "right side" (03/26/2012)   Skin cancer of trunk    "left chest" (03/26/2012)   Spinal stenosis    Stroke (HCC) 2014   NO RESIDUAL PROBLEMS   Type II diabetes mellitus (HCC)    Valvular heart disease     Past Surgical History:  Procedure Laterality Date   A-FLUTTER ABLATION N/A 12/01/2016   Procedure: A-Flutter Ablation;  Surgeon: Hillis Range, MD;  Location: [facility] INVASIVE CV LAB;  Service:  Cardiovascular;  Laterality: N/A;   ADENOIDECTOMY  1956   ANTERIOR CERVICAL DECOMP/DISCECTOMY FUSION  2000's   AORTIC VALVE REPLACEMENT  08/29/2010   25 mm CE Magna bioprosthetic - [facility]   APPENDECTOMY  1951   CARDIAC CATHETERIZATION  10/20/2003   Est. EF of 65% -- Critical disease in the mid to distal circumflex --  Preserved left ventricular  function --    CARDIAC VALVE REPLACEMENT     CAROTID ENDARTERECTOMY  05/05/2003   right carotid endarterectomy   CERVICAL DISC SURGERY  1980's   CORONARY ANGIOPLASTY WITH STENT PLACEMENT  10/20/2003   Percutaneous coronary intervention/drug-eluding stent implantation, third marginal branch --  Percutaneous closure, right femoral artery -- Atherosclerotic coronary vascular disease, single vessel / Status post successful percutaneous coronary intervention/tandem drug -- eluding stent implantation proximal third marginal branch -- Typical angina was not reproduced with device insertion or balloon    CORONARY ARTERY BYPASS GRAFT  08/29/2010   CABG X2; SVG to PDA, SVG to OM2- [facility]   CYSTOSCOPY WITH FULGERATION N/A 09/17/2022   Procedure: CYSTOSCOPY WITH FULGERATION AND CLOT EVACUATION;  Surgeon: Loletta Parish., MD;  Location: [facility] ORS;  Service: Urology;  Laterality: N/A;   CYSTOSCOPY WITH FULGERATION N/A 01/09/2023   Procedure: CYSTOSCOPY WITH CLOT EVACUATION, FULGERATION;  Surgeon:  Noel Christmas, MD;  Location: [facility] ORS;  Service: Urology;  Laterality: N/A;   ESOPHAGOGASTRODUODENOSCOPY (EGD) WITH PROPOFOL N/A 07/27/2022   Procedure: ESOPHAGOGASTRODUODENOSCOPY (EGD) WITH PROPOFOL;  Surgeon: Vida Rigger, MD;  Location: [facility];  Service: Gastroenterology;  Laterality: N/A;   HEMATOMA EVACUATION  05/09/2003   Evacuation of hematoma, right neck   LEFT HEART CATHETERIZATION WITH CORONARY ANGIOGRAM N/A 03/27/2012   Procedure: LEFT HEART CATHETERIZATION WITH CORONARY ANGIOGRAM;  Surgeon: Peter M Swaziland, MD;  Location: [facility] CATH LAB;  Service:  Cardiovascular;  Laterality: N/A;   LUMBAR DISC SURGERY  1996   MITRAL VALVE REPAIR  08/23/2010   28 mm Simulus ring -[facility]   POSTERIOR FUSION LUMBAR SPINE  1997   PROSTATE BIOPSY     SHOULDER ARTHROSCOPY  01/07/2007   Left shoulder impingement, labral tear   SKIN CANCER EXCISION  1990's; 2012   "left chest & right nose" (03/26/2012)   TEE WITHOUT CARDIOVERSION N/A 05/15/2012   Procedure: TRANSESOPHAGEAL ECHOCARDIOGRAM (TEE);  Surgeon: Vesta Mixer, MD;  Location: [facility] ENDOSCOPY;  Service: Cardiovascular;  Laterality: N/A;   TEE WITHOUT CARDIOVERSION N/A 12/01/2016   Procedure: TRANSESOPHAGEAL ECHOCARDIOGRAM (TEE);  Surgeon: Pricilla Riffle, MD;  Location: [facility] ENDOSCOPY;  Service: Cardiovascular;  Laterality: N/A;   TONSILLECTOMY AND ADENOIDECTOMY  1956   TRANSURETHRAL RESECTION OF PROSTATE N/A 04/26/2015   Procedure: TRANSURETHRAL RESECTION OF THE PROSTATE WITH GYRUS INSTRUMENTS;  Surgeon: Crist Fat, MD;  Location: [facility] ORS;  Service: Urology;  Laterality: N/A;  TURP-BIPOLAR    Allergies: Patient has no known allergies.  Medications: Prior to Admission medications   Medication Sig Start Date End Date Taking? Authorizing Provider  ferrous sulfate 325 (65 FE) MG tablet Take 1 tablet (325 mg total) by mouth daily with breakfast. 07/28/22  Yes Adhikari, Amrit, MD  FLUoxetine (PROZAC) 20 MG capsule Take 20 mg by mouth at bedtime.    Yes [provider]  furosemide (LASIX) 40 MG tablet Take 40 mg by mouth daily.   Yes [provider]  gabapentin (NEURONTIN) 600 MG tablet Take 600 mg by mouth 2 (two) times daily. 07/17/22  Yes [provider]  hyoscyamine (LEVSIN SL) 0.125 MG SL tablet Place 2 tablets (0.25 mg total) under the tongue every 4 (four) hours as needed for cramping (or bladder spasms). 01/11/23  Yes Azucena Fallen, MD  insulin lispro protamine-lispro (HUMALOG 75/25 MIX) (75-25) 100 UNIT/ML SUSP injection Inject 16 Units into the skin 2 (two) times daily  with a meal.   Yes [provider]  losartan (COZAAR) 50 MG tablet Take 50 mg by mouth daily.   Yes [provider]  Melatonin 3 MG TABS Take 1 tablet by mouth at bedtime.    Yes [provider]  metFORMIN (GLUCOPHAGE-XR) 500 MG 24 hr tablet Take 500 mg by mouth 2 (two) times daily with a meal. 12/12/21  Yes [provider]  metoprolol succinate (TOPROL-XL) 100 MG 24 hr tablet Take 100 mg by mouth at bedtime.   Yes [provider]  Multiple Vitamin (MULTIVITAMIN) tablet Take 1 tablet by mouth daily.   Yes [provider]  rosuvastatin (CRESTOR) 10 MG tablet Take 10 mg by mouth every morning.    Yes [provider]  tamsulosin (FLOMAX) 0.4 MG CAPS capsule Take 0.4 mg by mouth daily. 12/04/22  Yes [provider]  zolpidem (AMBIEN) 5 MG tablet Take 2.5 mg by mouth at bedtime as needed for sleep.   Yes [provider]  nitroGLYCERIN (NITROSTAT) 0.4 MG SL tablet Place 1 tablet (0.4 mg total) under the tongue every 5 (five) minutes as needed. For chest pain Patient taking differently: Place 0.4 mg under the tongue every 5 (five) minutes as needed for chest pain. For chest pain 03/28/12   [name], Deloris Ping, MD     Family History  Problem Relation Age of Onset   Diabetes Mother    Coronary artery disease Mother    Heart disease Mother    Stroke Father    Cancer Father        prostate   Cancer Daughter        lymphoma hodgkins    Seizures Daughter     Social History   Socioeconomic History   Marital status: Married    Spouse name: Not on file   Number of children: Not on file   Years of education: Not on file   Highest education level: Not on file  Occupational History   Not on file  Tobacco Use   Smoking status: Former    Current packs/day: 0.00    Average packs/day: 5.0 packs/day for 23.0 years (115.0 ttl pk-yrs)    Types: Cigarettes    Start date: 10/01/1955    Quit date: 10/01/1978    Years since  quitting: 44.4   Smokeless tobacco: Never  Vaping Use   Vaping status: Never Used  Substance and Sexual Activity   Alcohol use: No    Comment: 03/26/2012 "last alcohol was in 1970; never had problem w/it"   Drug use: No   Sexual activity: Never  Other Topics Concern   Not on file  Social History Narrative   Not on file   Social Determinants of Health   Financial Resource Strain: Not on file  Food Insecurity: No Food Insecurity (01/07/2023)   Hunger Vital Sign    Worried About Running Out of Food in the Last Year: Never true    Ran Out of Food in the Last Year: Never true  Transportation Needs: No Transportation Needs (01/07/2023)   PRAPARE - Administrator, Civil Service (Medical): No    Lack of Transportation (Non-Medical): No  Physical Activity: Not on file  Stress: Not on file  Social Connections: Not on file    Review of Systems: A 12 point ROS discussed and pertinent positives are indicated in the HPI above.  All other systems are negative.  Review of Systems  Constitutional:  Positive for activity change. Negative for fatigue.  Respiratory:  Negative for cough and shortness of breath.   Gastrointestinal:  Negative for abdominal pain.  Psychiatric/Behavioral:  Negative for behavioral problems and confusion.     Vital Signs: BP (!) 187/77   Pulse (!) 57   Temp 98.2 F (36.8 C) (Oral)   Resp 16   Ht 5\' 6"  (1.676 m)   Wt 150 lb (68 kg)   SpO2 97%   BMI 24.21 kg/m   Advance Care Plan: The advanced care plan/surrogate decision maker was discussed at the time of visit and documented in the medical record.    Physical Exam Vitals reviewed.  HENT:     Mouth/Throat:     Mouth: Mucous membranes are moist.  Cardiovascular:     Rate and Rhythm: Normal rate and regular rhythm.     Heart sounds: Murmur heard.  Abdominal:     Palpations: Abdomen is soft.  Genitourinary:    Comments: SPT in place without issue. Musculoskeletal:  General: Normal  range of motion.  Skin:    General: Skin is warm.  Neurological:     Mental Status: He is alert and oriented to person, place, and time.  Psychiatric:        Behavior: Behavior normal.     Imaging: No results found.  Labs:  CBC: Recent Labs    01/09/23 0710 01/10/23 0517 01/11/23 0459 01/24/23 0720  WBC 13.0* 9.5 12.0* 9.7  HGB 8.3* 7.5* 10.6* 9.6*  HCT 25.6* 22.9* 32.0* 29.9*  PLT 216 216 282 223    COAGS: Recent Labs    07/24/22 2016 01/07/23 1700 01/07/23 2320 01/24/23 0720  INR 1.2 1.2 1.3* 1.1    BMP: Recent Labs    01/07/23 2320 01/09/23 0710 01/10/23 0517 01/11/23 0459  NA 136 137 136 139  K 3.9 3.8 3.8 3.8  CL 108 107 107 104  CO2 19* 21* 21* 26  GLUCOSE 196* 167* 172* 134*  BUN 26* 16 18 20   CALCIUM 8.0* 8.5* 8.5* 8.8*  CREATININE 1.65* 1.24 1.12 1.16  GFRNONAA 41* 57* >60 >60    LIVER FUNCTION TESTS: Recent Labs    01/07/23 2320 01/09/23 0710 01/10/23 0517 01/11/23 0459  BILITOT 0.6 0.7 0.5 0.6  AST 45* 49* 35 37  ALT 29 41 39 41  ALKPHOS 65 62 58 63  PROT 5.8* 5.9* 5.6* 5.9*  ALBUMIN 2.9* 2.9* 2.6* 2.8*    TUMOR MARKERS: No results for input(s): "AFPTM", "CEA", "CA199", "CHROMGRNA" in the last 8760 hours.  Assessment and Plan: Urinary retention, gross hematuria. Patient with past medical history of urinary retention s/p recent SPT placement presents with complaint of routine exchange and upsize.   Case reviewed by Dr. Loreta Ave who approves patient for procedure.  Patient presents today in their usual state of health.  He has been NPO and is not currently on blood thinners.   Risks and benefits discussed with the patient including bleeding, infection, damage to adjacent structures, bowel perforation/fistula connection, and sepsis.  All of the patient's questions were answered, patient is agreeable to proceed. Consent signed and in chart.  Thank you for this interesting consult.  I greatly enjoyed meeting NICHAEL REEM  and look forward to participating in their care.  A copy of this report was sent to the requesting provider on this date.  Electronically Signed: Hoyt Koch, PA 02/26/2023, 2:59 PM   I spent a total of  30 Minutes   in face to face in clinical consultation, greater than 50% of which was counseling/coordinating care for gross hematuria, urinary retention.

## 2023-02-27 ENCOUNTER — Encounter (HOSPITAL_BASED_OUTPATIENT_CLINIC_OR_DEPARTMENT_OTHER): Payer: Medicare Other | Admitting: Internal Medicine

## 2023-02-27 DIAGNOSIS — N3041 Irradiation cystitis with hematuria: Secondary | ICD-10-CM | POA: Diagnosis not present

## 2023-02-27 DIAGNOSIS — E119 Type 2 diabetes mellitus without complications: Secondary | ICD-10-CM | POA: Diagnosis not present

## 2023-02-27 DIAGNOSIS — L598 Other specified disorders of the skin and subcutaneous tissue related to radiation: Secondary | ICD-10-CM | POA: Diagnosis not present

## 2023-02-27 DIAGNOSIS — C61 Malignant neoplasm of prostate: Secondary | ICD-10-CM | POA: Diagnosis not present

## 2023-02-27 LAB — GLUCOSE, CAPILLARY: Glucose-Capillary: 204 mg/dL — ABNORMAL HIGH (ref 70–99)

## 2023-02-27 NOTE — Progress Notes (Signed)
KAIDYN, GURA ([id number]) [id number] Page 1 of 2 Visit Report for 02/27/2023 Arrival Information Details Patient Name: Date of Service: [name], [name] 02/27/2023 12:30 PM Medical Record Number: [id number] Patient Account Number: [id number] Date of Birth/Sex: Treating RN: 03/24/1935 (87 y.o. [name], Millard.Loa Primary Care Maxie Slovacek: Renford Dills Other Clinician: Haywood Pao Referring Luan Maberry: Treating Angella Montas/Extender: Gwyneth Revels in Treatment: 2 Visit Information History Since Last Visit All ordered tests and consults were completed: Yes Patient Arrived: [name] Added or deleted any medications: No Arrival Time: 12:27 Any new allergies or adverse reactions: No Accompanied By: spouse Had a fall or experienced change in No Transfer Assistance: None activities of daily living that may affect Patient Identification Verified: Yes risk of falls: Secondary Verification Process Completed: Yes Signs or symptoms of abuse/neglect since last visito No Patient Requires Transmission-Based Precautions: No Hospitalized since last visit: No Patient Has Alerts: No Implantable device outside of the clinic excluding No cellular tissue based products placed in the center since last visit: Pain Present Now: No Electronic Signature(s) Signed: 02/27/2023 3:05:39 PM By: Haywood Pao CHT EMT BS , , Entered By: Haywood Pao on 02/27/2023 12:05:39 -------------------------------------------------------------------------------- Encounter Discharge Information Details Patient Name: Date of Service: [name], [name] 02/27/2023 12:30 PM Medical Record Number: [id number] Patient Account Number: [id number] Date of Birth/Sex: Treating RN: [birth year] (87 y.o. Tammy Sours Primary Care Brigham Cobbins: Renford Dills Other Clinician: Haywood Pao Referring Allee Busk: Treating Casha Estupinan/Extender: Gwyneth Revels in Treatment: 2 Encounter Discharge Information Items Discharge Condition: Stable Ambulatory Status: Walker Discharge Destination: Home Transportation: Private Auto Accompanied By: spouse Schedule Follow-up Appointment: No Clinical Summary of Care: Electronic Signature(s) Signed: 02/27/2023 3:37:13 PM By: Haywood Pao CHT EMT BS , , Entered By: Haywood Pao on 02/27/2023 12:37:13 [name] ([id number]) [id number] Page 2 of 2 -------------------------------------------------------------------------------- Vitals Details Patient Name: Date of Service: [name], [name] 02/27/2023 12:30 PM Medical Record Number: [id number] Patient Account Number: [id number] Date of Birth/Sex: Treating RN: [birth year] (87 y.o. Tammy Sours Primary Care Selin Eisler: Renford Dills Other Clinician: Haywood Pao Referring Chelsey Redondo: Treating Dreyah Montrose/Extender: Gwyneth Revels in Treatment: 2 Vital Signs Time Taken: 13:01 Temperature (F): 98.1 Height (in): 66 Pulse (bpm): 57 Weight (lbs): 150 Respiratory Rate (breaths/min): 18 Body Mass Index (BMI): 24.2 Blood Pressure (mmHg): 148/63 Capillary Blood Glucose (mg/dl): 601 Reference Range: 80 - 120 mg / dl Electronic Signature(s) Signed: 02/27/2023 3:06:18 PM By: Haywood Pao CHT EMT BS , , Entered By: Haywood Pao on 02/27/2023 12:06:18

## 2023-02-27 NOTE — Progress Notes (Signed)
[name] ([id number]) [id number] Page 1 of 2 Visit Report for 02/27/2023 HBO Details Patient Name: Date of Service: [name], [name] 02/27/2023 12:30 PM Medical Record Number: [id number] Patient Account Number: [id number] Date of Birth/Sex: Treating RN: [birth year] (87 y.o. 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 HBO Treatment Details Treatment Number: 3 Patient Type: Outpatient Chamber Type: Monoplace Chamber Serial #: [id number] Treatment Protocol: 2.0 ATA with 90 minutes oxygen, and no air breaks Treatment Details Compression Rate Down: 1.0 psi / minute De-Compression Rate Up: 1.5 psi / minute Air breaks and breathing Decompress Decompress Compress Tx Pressure Begins Reached periods Begins Ends (leave unused spaces blank) Chamber Pressure (ATA 1 2 ------2 1 ) Clock Time (24 hr) 13:16 13:34 - - - - - - 15:04 15:12 Treatment Length: 116 (minutes) Treatment Segments: 4 Vital Signs Capillary Blood Glucose Reference Range: 80 - 120 mg / dl HBO Diabetic Blood Glucose Intervention Range: <131 mg/dl or >578 mg/dl Type: Time Vitals Blood Pulse: Respiratory Temperature: Capillary Blood Glucose Pulse Action Taken: Pressure: Rate: Glucose (mg/dl): Meter #: Oximetry (%) Taken: Pre 13:01 148/63 57 18 98.1 204 asymptomatic for bradycardia Post 15:15 156/68 57 18 97.2 125 asymptomatic for bradycardia Treatment Response Treatment Toleration: Well Treatment Completion Status: Treatment Completed without Adverse Event Treatment Notes [name] arrived with heart rate of57. He was  asymptomatic for bradycardia. He stated that he ate breakfast of oatmeal. He prepared for treatment. After performing a safety check and reviewing orientation information, patient was placed in the chamber which was compressed with 100% oxygen at the rate of 1 psi/min with 480 L/min. He demonstrated understanding of ear equalization during orientation and during travel. He tolerated the treatment and subsequent decompression of the chamber at the rate of 1.5 psi/min. He denied any issues with ear equalization and/or pain associated with barotrauma. Post-treatment vital signs were normal per protocol except pulse rate of 57. He denied any symptoms of bradycardia. He was stable upon discharge. [name] Notes No concerns with treatment given Physician HBO Attestation: I certify that I supervised this HBO treatment in accordance with Medicare guidelines. A trained emergency response team is readily available per Yes hospital policies and procedures. Continue HBOT as ordered. Yes Electronic Signature(s) [name], [name] ([id number]) [id number] Page 2 of 2 Unsigned Previous Signature: 02/27/2023 3:36:33 PM Version By: [name] CHT EMT BS , , Entered By: [name] on 02/27/2023 13:28:58 -------------------------------------------------------------------------------- HBO Safety Checklist Details Patient Name: Date of Service: [name], [name] 02/27/2023 12:30 PM Medical Record Number: [id number] Patient Account Number: [id number] Date of Birth/Sex: Treating RN: [birth year] (87 y.o. [name] Primary Care [name]: [name] Other Clinician: Haywood [name] Referring [name]: Treating [name]/Extender: [name] in Treatment: 2 HBO Safety Checklist Items Safety Checklist Consent Form Signed Patient voided / foley secured and emptied Foley When did you last eato 0900 Last dose of injectable or oral agent 0800  -insulin Ostomy pouch emptied and vented if applicable NA All implantable devices assessed, documented and approved Freestyle Libre 2 and3 on approved list Intravenous access site secured and place NA Valuables secured Linens and cotton and cotton/polyester blend (less than 51% polyester) Personal oil-based products / skin lotions / body lotions  removed Wigs or hairpieces removed NA Smoking or tobacco materials removed NA Books / newspapers / magazines / loose paper removed Cologne, aftershave, perfume and deodorant removed Jewelry removed (may wrap wedding band) Make-up removed NA Hair care products removed Battery operated devices (external) removed Heating patches and chemical warmers removed NA Titanium eyewear removed NA Nail polish cured greater than 10 hours NA Casting material cured greater than 10 hours NA Hearing aids removed NA Loose dentures or partials removed NA Prosthetics have been removed NA Patient demonstrates correct use of air break device (if applicable) Patient concerns have been addressed Patient grounding bracelet on and cord attached to chamber Specifics for Inpatients (complete in addition to above) Medication sheet sent with patient NA Intravenous medications needed or due during therapy sent with patient NA Drainage tubes (e.g. nasogastric tube or chest tube secured and vented) NA Endotracheal or Tracheotomy tube secured NA Cuff deflated of air and inflated with saline NA Airway suctioned NA Notes Paper version used prior to treatment start. Electronic Signature(s) Signed: 02/27/2023 3:08:16 PM By: [name] CHT EMT BS , , Entered By: [name] on 02/27/2023 12:08:16

## 2023-02-28 ENCOUNTER — Encounter (HOSPITAL_BASED_OUTPATIENT_CLINIC_OR_DEPARTMENT_OTHER): Payer: Medicare Other | Admitting: Internal Medicine

## 2023-02-28 DIAGNOSIS — C61 Malignant neoplasm of prostate: Secondary | ICD-10-CM | POA: Diagnosis not present

## 2023-02-28 DIAGNOSIS — E119 Type 2 diabetes mellitus without complications: Secondary | ICD-10-CM | POA: Diagnosis not present

## 2023-02-28 DIAGNOSIS — N3041 Irradiation cystitis with hematuria: Secondary | ICD-10-CM | POA: Diagnosis not present

## 2023-02-28 DIAGNOSIS — L598 Other specified disorders of the skin and subcutaneous tissue related to radiation: Secondary | ICD-10-CM | POA: Diagnosis not present

## 2023-02-28 LAB — GLUCOSE, CAPILLARY
Glucose-Capillary: 125 mg/dL — ABNORMAL HIGH (ref 70–99)
Glucose-Capillary: 169 mg/dL — ABNORMAL HIGH (ref 70–99)
Glucose-Capillary: 95 mg/dL (ref 70–99)

## 2023-02-28 NOTE — Progress Notes (Signed)
EDREI, LOEBER ([id number]) [id number] Page 1 of 1 Visit Report for 02/27/2023 SuperBill Details Patient Name: Date of Service: [name], [name] 02/27/2023 Medical Record Number: [id number] Patient Account Number: [id number] Date of Birth/Sex: Treating RN: 07-22-1935 (87 y.o. Tammy Sours Primary Care Provider: Renford Dills Other Clinician: Haywood Pao Referring Provider: Treating Provider/Extender: Gwyneth Revels in Treatment: 2 Diagnosis Coding ICD-10 Codes Code Description N30.41 Irradiation cystitis with hematuria C61 Malignant neoplasm of prostate E11.9 Type 2 diabetes mellitus without complications Facility Procedures CPT4 Code Description Modifier Quantity 00938182 G0277-(Facility Use Only) HBOT full body chamber, , 4 ICD-10 Diagnosis Description N30.41 Irradiation cystitis with hematuria C61 Malignant neoplasm of prostate E11.9 Type 2 diabetes mellitus without complications Physician Procedures Quantity CPT4 Code Description Modifier [id number] 99183 - WC PHYS HYPERBARIC OXYGEN THERAPY 1 ICD-10 Diagnosis Description N30.41 Irradiation cystitis with hematuria C61 Malignant neoplasm of prostate E11.9 Type 2 diabetes mellitus without complications Electronic Signature(s) Signed: 02/27/2023 3:36:48 PM By: Haywood Pao CHT EMT BS , , Signed: 02/28/2023 10:03:28 AM By: Baltazar Najjar MD Entered By: Haywood Pao on 02/27/2023 12:36:47

## 2023-03-01 ENCOUNTER — Encounter (HOSPITAL_BASED_OUTPATIENT_CLINIC_OR_DEPARTMENT_OTHER): Payer: Medicare Other | Admitting: Internal Medicine

## 2023-03-01 DIAGNOSIS — R338 Other retention of urine: Secondary | ICD-10-CM | POA: Diagnosis not present

## 2023-03-01 DIAGNOSIS — C61 Malignant neoplasm of prostate: Secondary | ICD-10-CM | POA: Diagnosis not present

## 2023-03-01 DIAGNOSIS — E119 Type 2 diabetes mellitus without complications: Secondary | ICD-10-CM | POA: Diagnosis not present

## 2023-03-01 DIAGNOSIS — L598 Other specified disorders of the skin and subcutaneous tissue related to radiation: Secondary | ICD-10-CM | POA: Diagnosis not present

## 2023-03-01 DIAGNOSIS — R310 Gross hematuria: Secondary | ICD-10-CM | POA: Diagnosis not present

## 2023-03-01 DIAGNOSIS — N3041 Irradiation cystitis with hematuria: Secondary | ICD-10-CM | POA: Diagnosis not present

## 2023-03-01 LAB — GLUCOSE, CAPILLARY
Glucose-Capillary: 143 mg/dL — ABNORMAL HIGH (ref 70–99)
Glucose-Capillary: 115 mg/dL — ABNORMAL HIGH (ref 70–99)

## 2023-03-01 NOTE — Progress Notes (Signed)
HADRIEL, KOLLER ([id number]) [id number] Page 1 of 1 Visit Report for 02/28/2023 SuperBill Details Patient Name: Date of Service: [name], [name] 02/28/2023 Medical Record Number: [id number] Patient Account Number: [id number] Date of Birth/Sex: Treating RN: Aug 08, 1935 (87 y.o. [name] Primary Care Provider: Renford Dills Other Clinician: Haywood Pao Referring Provider: Treating Provider/Extender: Gwyneth Revels in Treatment: 3 Diagnosis Coding ICD-10 Codes Code Description N30.41 Irradiation cystitis with hematuria C61 Malignant neoplasm of prostate E11.9 Type 2 diabetes mellitus without complications Facility Procedures CPT4 Code Description Modifier Quantity [id number] G0277-(Facility Use Only) HBOT full body chamber, , 4 ICD-10 Diagnosis Description N30.41 Irradiation cystitis with hematuria C61 Malignant neoplasm of prostate E11.9 Type 2 diabetes mellitus without complications Physician Procedures Quantity CPT4 Code Description Modifier [id number] 99183 - WC PHYS HYPERBARIC OXYGEN THERAPY 1 ICD-10 Diagnosis Description N30.41 Irradiation cystitis with hematuria C61 Malignant neoplasm of prostate E11.9 Type 2 diabetes mellitus without complications Electronic Signature(s) Signed: 02/28/2023 4:43:38 PM By: Haywood Pao CHT EMT BS , , Signed: 02/28/2023 5:18:28 PM By: Baltazar Najjar MD Entered By: Haywood Pao on 02/28/2023 13:43:38

## 2023-03-01 NOTE — Progress Notes (Signed)
CANTRELL, [facility] ([id number]) [id number] Page 1 of 2 Visit Report for 02/28/2023 Arrival Information Details Patient Name: Date of Service: [name], [name] 02/28/2023 12:30 PM Medical Record Number: [id number] Patient Account Number: [id number] Date of Birth/Sex: Treating RN: September 09, 1935 (87 y.o. [name] Primary Care Sabastion Hrdlicka: Renford Dills Other Clinician: Haywood Pao Referring Jelina Paulsen: Treating Ahniya Mitchum/Extender: Gwyneth Revels in Treatment: 3 Visit Information History Since Last Visit All ordered tests and consults were completed: Yes Patient Arrived: [name] Added or deleted any medications: No Arrival Time: 12:18 Any new allergies or adverse reactions: No Accompanied By: spouse Had a fall or experienced change in No Transfer Assistance: None activities of daily living that may affect Patient Identification Verified: Yes risk of falls: Secondary Verification Process Completed: Yes Signs or symptoms of abuse/neglect since last visito No Patient Requires Transmission-Based Precautions: No Hospitalized since last visit: No Patient Has Alerts: No Implantable device outside of the clinic excluding No cellular tissue based products placed in the center since last visit: Pain Present Now: No Electronic Signature(s) Signed: 02/28/2023 4:38:10 PM By: Haywood Pao CHT EMT BS , , Entered By: Haywood Pao on 02/28/2023 13:38:10 -------------------------------------------------------------------------------- Encounter Discharge Information Details Patient Name: Date of Service: [name], [name] 02/28/2023 12:30 PM Medical Record Number: [id number] Patient Account Number: [id number] Date of Birth/Sex: Treating RN: [birth year] (87 y.o. [name] Primary Care Cadin Luka: Renford Dills Other Clinician: Haywood Pao Referring Yaritzi Craun: Treating Sofia Vanmeter/Extender: Gwyneth Revels in Treatment: 3 Encounter Discharge Information Items Discharge Condition: Stable Ambulatory Status: Ambulatory Discharge Destination: Home Transportation: Private Auto Accompanied By: spouse Schedule Follow-up Appointment: No Clinical Summary of Care: Electronic Signature(s) Signed: 02/28/2023 4:44:13 PM By: Haywood Pao CHT EMT BS , , Entered By: Haywood Pao on 02/28/2023 13:44:13 Nolley, [name] ([id number]) [id number] Page 2 of 2 -------------------------------------------------------------------------------- Vitals Details Patient Name: Date of Service: [name], [name] 02/28/2023 12:30 PM Medical Record Number: [id number] Patient Account Number: [id number] Date of Birth/Sex: Treating RN: [birth year] (87 y.o. [name] Primary Care Artrice Kraker: Renford Dills Other Clinician: Haywood Pao Referring Masey Scheiber: Treating Genora Arp/Extender: Gwyneth Revels in Treatment: 3 Vital Signs Time Taken: 12:38 Temperature (F): 97.7 Height (in): 66 Pulse (bpm): 59 Weight (lbs): 150 Respiratory Rate (breaths/min): 18 Body Mass Index (BMI): 24.2 Blood Pressure (mmHg): 151/70 Capillary Blood Glucose (mg/dl): 220 Reference Range: 80 - 120 mg / dl Electronic Signature(s) Signed: 02/28/2023 4:38:34 PM By: Haywood Pao CHT EMT BS , , Entered By: Haywood Pao on 02/28/2023 13:38:34

## 2023-03-01 NOTE — Progress Notes (Addendum)
[name], [name] ([id number]) [id number] Page 1 of 2 Visit Report for 02/28/2023 HBO Details Patient Name: Date of Service: [name], [name] 02/28/2023 12:30 PM Medical Record Number: [id number] Patient Account Number: [id number] Date of Birth/Sex: Treating RN: June 11, 1935 (87 y.o. 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 HBO Treatment Details Treatment Number: 4 Patient Type: Outpatient Chamber Type: Monoplace Chamber Serial #: [id number] Treatment Protocol: 2.0 ATA with 90 minutes oxygen, and no air breaks Treatment Details Compression Rate Down: 1.0 psi / minute De-Compression Rate Up: 1.5 psi / minute Air breaks and breathing Decompress Decompress Compress Tx Pressure Begins Reached periods Begins Ends (leave unused spaces blank) Chamber Pressure (ATA 1 2 ------2 1 ) Clock Time (24 hr) 12:44 13:01 - - - - - - 14:31 14:41 Treatment Length: 117 (minutes) Treatment Segments: 4 Vital Signs Capillary Blood Glucose Reference Range: 80 - 120 mg / dl HBO Diabetic Blood Glucose Intervention Range: <131 mg/dl or >962 mg/dl Type: Time Vitals Blood Pulse: Respiratory Temperature: Capillary Blood Glucose Pulse Action Taken: Pressure: Rate: Glucose (mg/dl): Meter #: Oximetry (%) Taken: Pre 12:38 151/70 59 18 97.7 169 asymptomatic for bradycardia Post 14:43 179/76 55 18 97.7 95 asymptomatic for bradycardia Treatment Response Treatment Toleration: Well Treatment Completion Status: Treatment Completed without Adverse Event Treatment Notes Mr. Eads arrived with heart rate of 59. He was  asymptomatic for bradycardia. He stated that he ate meal of soup. He prepared for treatment. After performing a safety check and reviewing orientation information, patient was placed in the chamber which was compressed with 100% oxygen at the rate of 1 psi/min with 480 L/min which effectively reduces actual travel rate to 0.74 psi/min. Travel rate was increased to normal 1 psi/min at about 10 psig. He tolerated the treatment and subsequent decompression of the chamber at the rate of 1.5 psi/min. He denied any issues with ear equalization and/or pain associated with barotrauma. Post-treatment vital signs were normal per protocol except pulse rate of 55. He denied any symptoms of bradycardia. He was stable upon discharge. Darshay Deupree Notes No concern with treatment given Physician HBO Attestation: I certify that I supervised this HBO treatment in accordance with Medicare guidelines. A trained emergency response team is readily available per Yes hospital policies and procedures. Continue HBOT as ordered. [address] KOLSTEN, BESSLER ([id number]) [id number] Page 2 of 2 Electronic Signature(s) Signed: 02/28/2023 5:18:28 PM By: Baltazar Najjar MD Previous Signature: 02/28/2023 4:43:17 PM Version By: [name] CHT EMT BS , , Entered By: Baltazar Najjar on 02/28/2023 14:17:29 -------------------------------------------------------------------------------- HBO Safety Checklist Details Patient Name: Date of Service: [name], [name] 02/28/2023 12:30 PM Medical Record Number: [id number] Patient Account Number: [id number] Date of Birth/Sex: Treating RN: Aug 12, 1935 (87 y.o. [name] Primary Care Jermario Kalmar: Renford Dills Other Clinician: Haywood [name] Referring Ariauna Farabee: Treating Keriana Sarsfield/Extender: [name] in Treatment: 3 HBO Safety Checklist Items Safety Checklist Consent Form Signed Patient voided / foley secured and emptied  Foley When did you last eato 1030 - soup Last dose of injectable or oral agent 1000 Ostomy pouch emptied and vented if applicable NA All implantable devices assessed, documented and approved Freestyle Libre 2 and3 on approved list Intravenous access site secured and place NA Valuables secured  Linens and cotton and cotton/polyester blend (less than 51% polyester) Personal oil-based products / skin lotions / body lotions removed Wigs or hairpieces removed NA Smoking or tobacco materials removed NA Books / newspapers / magazines / loose paper removed Cologne, aftershave, perfume and deodorant removed Jewelry removed (may wrap wedding band) Make-up removed NA Hair care products removed Battery operated devices (external) removed Heating patches and chemical warmers removed Titanium eyewear removed Nail polish cured greater than 10 hours Casting material cured greater than 10 hours Hearing aids removed NA Loose dentures or partials removed NA Prosthetics have been removed NA Patient demonstrates correct use of air break device (if applicable) Patient concerns have been addressed Patient grounding bracelet on and cord attached to chamber Specifics for Inpatients (complete in addition to above) Medication sheet sent with patient NA Intravenous medications needed or due during therapy sent with patient NA Drainage tubes (e.g. nasogastric tube or chest tube secured and vented) NA Endotracheal or Tracheotomy tube secured NA Cuff deflated of air and inflated with saline NA Airway suctioned NA Notes Paper version used prior to treatment start. Electronic Signature(s) Signed: 02/28/2023 4:40:04 PM By: [name] CHT EMT BS , , Entered By: [name] on 02/28/2023 13:40:04

## 2023-03-02 NOTE — Progress Notes (Addendum)
PRYOR, REVILLA ([id number]) [id number] Page 1 of 2 Visit Report for 03/01/2023 HBO Details Patient Name: Date of Service: [name], [name] 03/01/2023 12:30 PM Medical Record Number: [id number] Patient Account Number: [id number] Date of Birth/Sex: Treating RN: 08/30/1935 (87 y.o. 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 HBO Treatment Details Treatment Number: 5 Patient Type: Outpatient Chamber Type: Monoplace Chamber Serial #: [id number] Treatment Protocol: 2.0 ATA with 90 minutes oxygen, and no air breaks Treatment Details Compression Rate Down: 1.0 psi / minute De-Compression Rate Up: 1.5 psi / minute Air breaks and breathing Decompress Decompress Compress Tx Pressure Begins Reached periods Begins Ends (leave unused spaces blank) Chamber Pressure (ATA 1 2 ------2 1 ) Clock Time (24 hr) 12:49 13:01 - - - - - - 14:31 14:41 Treatment Length: 112 (minutes) Treatment Segments: 4 Vital Signs Capillary Blood Glucose Reference Range: 80 - 120 mg / dl HBO Diabetic Blood Glucose Intervention Range: <131 mg/dl or >578 mg/dl Type: Time Vitals Blood Pulse: Respiratory Temperature: Capillary Blood Glucose Pulse Action Taken: Pressure: Rate: Glucose (mg/dl): Meter #: Oximetry (%) Taken: Pre 12:25 157/58 66 18 97.5 143 asymptomatic for hypotension Post 14:43 177/90 66 18 97.3 115 none per protocol Treatment Response Treatment Toleration: Well Treatment Completion Status: Treatment Completed without Adverse Event Treatment Notes Mr. Akkerman arrived with diastolic BP of 58 mmHg. He was asymptomatic for  hypotension. All other vital signs were within normal range. He stated that he ate meal of soup and chicken salad sandwich. He prepared for treatment. After performing a safety check, patient was placed in the chamber which was compressed with 100% oxygen at the rate of 1 psi/min with 480 L/min which effectively reduces actual travel rate to 0.74 psi/min. Travel rate was increased to normal 1 psi/min by decreasing ventilation rate in increments of 5 psig ultimately reaching 275 L/min. He tolerated the treatment and subsequent decompression of the chamber at the rate of 1.5 psi/min. He denied any issues with ear equalization and/or pain associated with barotrauma. Post-treatment vital signs were normal per protocol. He was stable upon discharge. Lacoya Wilbanks Notes No concerns with treatment given Physician HBO Attestation: I certify that I supervised this HBO treatment in accordance with Medicare guidelines. A trained emergency response team is readily available per Yes hospital policies and procedures. Continue HBOT as ordered. [address] [place], WAND ([id number]) [id number] Page 2 of 2 Electronic Signature(s) Signed: 03/01/2023 5:17:16 PM By: Baltazar Najjar MD Previous Signature: 03/01/2023 5:10:50 PM Version By: Haywood Pao CHT EMT BS , , Entered By: Baltazar Najjar on 03/01/2023 17:16:05 -------------------------------------------------------------------------------- HBO Safety Checklist Details Patient Name: Date of Service: [name], [name] 03/01/2023 12:30 PM Medical Record Number: [id number] Patient Account Number: [id number] Date of Birth/Sex: Treating RN: December 06, 1935 (87 y.o. Tammy Sours Primary Care Wende Longstreth: Renford Dills Other Clinician: Referring Kanyah Matsushima: Treating Wrenly Lauritsen/Extender: Gwyneth Revels in Treatment: 3 HBO Safety Checklist Items Safety Checklist Consent Form Signed Patient voided / foley secured and emptied  Foley When did you last eato 1200 soup and sandwich Last dose of injectable or oral agent 0800 Ostomy pouch emptied and vented if applicable NA All implantable devices assessed, documented and approved Freestyle Libre 2 and3 on approved list Intravenous access site secured and  place NA Valuables secured Linens and cotton and cotton/polyester blend (less than 51% polyester) Personal oil-based products / skin lotions / body lotions removed Wigs or hairpieces removed NA Smoking or tobacco materials removed NA Books / newspapers / magazines / loose paper removed Cologne, aftershave, perfume and deodorant removed Jewelry removed (may wrap wedding band) Make-up removed NA Hair care products removed Libre Freestyle 2 and 3 Sensor Approved, other Battery operated devices (external) removed electronics removed. Heating patches and chemical warmers removed Titanium eyewear removed Nail polish cured greater than 10 hours Casting material cured greater than 10 hours NA Hearing aids removed NA Loose dentures or partials removed NA Prosthetics have been removed NA Patient demonstrates correct use of air break device (if applicable) Patient concerns have been addressed Patient grounding bracelet on and cord attached to chamber Specifics for Inpatients (complete in addition to above) Medication sheet sent with patient NA Intravenous medications needed or due during therapy sent with patient NA Drainage tubes (e.g. nasogastric tube or chest tube secured and vented) NA Endotracheal or Tracheotomy tube secured NA Cuff deflated of air and inflated with saline NA Airway suctioned NA Notes Paper version used prior to treatment start. Electronic Signature(s) Signed: 03/01/2023 5:06:21 PM By: Haywood Pao CHT EMT BS , , Entered By: Haywood Pao on 03/01/2023 17:06:21

## 2023-03-02 NOTE — Progress Notes (Signed)
DENZALE, MOYA ([id number]) [id number].pdf Page 1 of 1 Visit Report for 03/01/2023 SuperBill Details Patient Name: Date of Service: [name], [name] 03/01/2023 Medical Record Number: [id number] Patient Account Number: [id number] Date of Birth/Sex: Treating RN: 1935-10-17 (87 y.o. [name], Millard.Loa Primary Care Provider: Renford Dills Other Clinician: Referring Provider: Treating Provider/Extender: Gwyneth Revels in Treatment: 3 Diagnosis Coding ICD-10 Codes Code Description N30.41 Irradiation cystitis with hematuria C61 Malignant neoplasm of prostate E11.9 Type 2 diabetes mellitus without complications Facility Procedures CPT4 Code Description Modifier Quantity [id number] G0277-(Facility Use Only) HBOT full body chamber, , 4 ICD-10 Diagnosis Description N30.41 Irradiation cystitis with hematuria C61 Malignant neoplasm of prostate E11.9 Type 2 diabetes mellitus without complications Physician Procedures Quantity CPT4 Code Description Modifier [id number] - WC PHYS HYPERBARIC OXYGEN THERAPY 1 ICD-10 Diagnosis Description N30.41 Irradiation cystitis with hematuria C61 Malignant neoplasm of prostate E11.9 Type 2 diabetes mellitus without complications Electronic Signature(s) Signed: 03/01/2023 5:11:06 PM By: Haywood Pao CHT EMT BS , , Signed: 03/01/2023 5:17:16 PM By: Baltazar Najjar MD Entered By: Haywood Pao on 03/01/2023 17:11:06

## 2023-03-02 NOTE — Progress Notes (Signed)
KHANE, PIERPONT ([id number]) [id number] Page 1 of 2 Visit Report for 03/01/2023 Arrival Information Details Patient Name: Date of Service: [name], [name] 03/01/2023 12:30 PM Medical Record Number: [id number] Patient Account Number: [id number] Date of Birth/Sex: Treating RN: [birth year] (87 y.o. [name], [name] Primary Care Wah Sabic: Renford Dills Other Clinician: Haywood Pao Referring Abri Vacca: Treating Roshaunda Starkey/Extender: Gwyneth Revels in Treatment: 3 Visit Information History Since Last Visit All ordered tests and consults were completed: Yes Patient Arrived: [name] Added or deleted any medications: No Arrival Time: 12:23 Any new allergies or adverse reactions: No Accompanied By: spouse Had a fall or experienced change in No Transfer Assistance: None activities of daily living that may affect Patient Identification Verified: Yes risk of falls: Secondary Verification Process Completed: Yes Signs or symptoms of abuse/neglect since last visito No Patient Requires Transmission-Based Precautions: No Hospitalized since last visit: No Patient Has Alerts: No Implantable device outside of the clinic excluding No cellular tissue based products placed in the center since last visit: Pain Present Now: No Electronic Signature(s) Signed: 03/01/2023 5:03:38 PM By: Haywood Pao CHT EMT BS , , Entered By: Haywood Pao on 03/01/2023 17:03:38 -------------------------------------------------------------------------------- Encounter Discharge Information Details Patient Name: Date of Service: [name], [name] 03/01/2023 12:30 PM Medical Record Number: [id number] Patient Account Number: [id number] Date of Birth/Sex: Treating RN: 1935-07-04 (87 y.o. [name] Primary Care Elmo Shumard: Renford Dills Other Clinician: Referring Marvene Strohm: Treating Takota Cahalan/Extender: Gwyneth Revels in Treatment:  3 Encounter Discharge Information Items Discharge Condition: Stable Ambulatory Status: Walker Discharge Destination: Home Transportation: Private Auto Accompanied By: spouse Schedule Follow-up Appointment: No Clinical Summary of Care: Electronic Signature(s) Signed: 03/01/2023 5:11:33 PM By: Haywood Pao CHT EMT BS , , Entered By: Haywood Pao on 03/01/2023 17:11:33 Pavao, [name] ([id number]) [id number] Page 2 of 2 -------------------------------------------------------------------------------- Vitals Details Patient Name: Date of Service: [name], [name] 03/01/2023 12:30 PM Medical Record Number: [id number] Patient Account Number: [id number] Date of Birth/Sex: Treating RN: 19-Dec-1935 (87 y.o. [name] Primary Care Sirena Riddle: Renford Dills Other Clinician: Referring Klein Willcox: Treating Vineet Kinney/Extender: Gwyneth Revels in Treatment: 3 Vital Signs Time Taken: 12:25 Temperature (F): 97.5 Height (in): 66 Pulse (bpm): 66 Weight (lbs): 150 Respiratory Rate (breaths/min): 18 Body Mass Index (BMI): 24.2 Blood Pressure (mmHg): 157/58 Capillary Blood Glucose (mg/dl): 433 Reference Range: 80 - 120 mg / dl Electronic Signature(s) Signed: 03/01/2023 5:04:20 PM By: Haywood Pao CHT EMT BS , , Entered By: Haywood Pao on 03/01/2023 17:04:20

## 2023-03-05 ENCOUNTER — Ambulatory Visit: Payer: Medicare Other | Admitting: Podiatry

## 2023-03-05 ENCOUNTER — Encounter (HOSPITAL_BASED_OUTPATIENT_CLINIC_OR_DEPARTMENT_OTHER): Payer: Medicare Other | Admitting: General Surgery

## 2023-03-05 DIAGNOSIS — N3041 Irradiation cystitis with hematuria: Secondary | ICD-10-CM | POA: Diagnosis not present

## 2023-03-05 DIAGNOSIS — C61 Malignant neoplasm of prostate: Secondary | ICD-10-CM | POA: Diagnosis not present

## 2023-03-05 DIAGNOSIS — E119 Type 2 diabetes mellitus without complications: Secondary | ICD-10-CM | POA: Diagnosis not present

## 2023-03-05 DIAGNOSIS — L598 Other specified disorders of the skin and subcutaneous tissue related to radiation: Secondary | ICD-10-CM | POA: Diagnosis not present

## 2023-03-05 LAB — GLUCOSE, CAPILLARY
Glucose-Capillary: 131 mg/dL — ABNORMAL HIGH (ref 70–99)
Glucose-Capillary: 88 mg/dL (ref 70–99)

## 2023-03-05 NOTE — Progress Notes (Signed)
[name] ([id number]) [id number] Page 1 of 2 Visit Report for 03/05/2023 Arrival Information Details Patient Name: Date of Service: [name] 03/05/2023 12:30 PM Medical Record Number: [id number] Patient Account Number: [id number] Date of Birth/Sex: Treating RN: [birth year] (87 y.o. [name], [name] Primary Care Chardai Gangemi: Renford Dills Other Clinician: Haywood Pao Referring Lucia Harm: Treating Dmya Long/Extender: Layla Maw in Treatment: 3 Visit Information History Since Last Visit All ordered tests and consults were completed: Yes Patient Arrived: [name] Added or deleted any medications: No Arrival Time: 12:18 Any new allergies or adverse reactions: No Accompanied By: spouse Had a fall or experienced change in No Transfer Assistance: None activities of daily living that may affect Patient Identification Verified: Yes risk of falls: Secondary Verification Process Completed: Yes Signs or symptoms of abuse/neglect since last visito No Patient Requires Transmission-Based Precautions: No Hospitalized since last visit: No Patient Has Alerts: No Implantable device outside of the clinic excluding No cellular tissue based products placed in the center since last visit: Pain Present Now: No Electronic Signature(s) Signed: 03/05/2023 3:11:54 PM By: Haywood Pao CHT EMT BS , , Entered By: Haywood Pao on 03/05/2023 15:11:54 -------------------------------------------------------------------------------- Encounter Discharge Information Details Patient Name: Date of Service: [name] American, [name] 03/05/2023 12:30 PM Medical Record Number: [id number] Patient Account Number: [id number] Date of Birth/Sex: Treating RN: 1935/11/16 (87 y.o. [name] Primary Care Aften Lipsey: Renford Dills Other Clinician: Haywood Pao Referring Ahana Najera: Treating Thomos Domine/Extender: Layla Maw in Treatment: 3 Encounter Discharge Information Items Discharge Condition: Stable Ambulatory Status: Walker Discharge Destination: Home Transportation: Private Auto Accompanied By: spouse Schedule Follow-up Appointment: No Clinical Summary of Care: Electronic Signature(s) Signed: 03/05/2023 3:48:46 PM By: Haywood Pao CHT EMT BS , , Entered By: Haywood Pao on 03/05/2023 15:48:46 [name] ([id number]) [id number] Page 2 of 2 -------------------------------------------------------------------------------- Vitals Details Patient Name: Date of Service: [name], [name] 03/05/2023 12:30 PM Medical Record Number: [id number] Patient Account Number: [id number] Date of Birth/Sex: Treating RN: [birth year] (87 y.o. [name] Primary Care Chesley Veasey: Renford Dills Other Clinician: Haywood Pao Referring Novah Nessel: Treating Agnieszka Newhouse/Extender: Layla Maw in Treatment: 3 Vital Signs Time Taken: 12:36 Temperature (F): 98.2 Height (in): 66 Pulse (bpm): 126 Weight (lbs): 150 Respiratory Rate (breaths/min): 18 Body Mass Index (BMI): 24.2 Blood Pressure (mmHg): 130/87 Capillary Blood Glucose (mg/dl): 010 Reference Range: 80 - 120 mg / dl Electronic Signature(s) Signed: 03/05/2023 3:12:23 PM By: Haywood Pao CHT EMT BS , , Entered By: Haywood Pao on 03/05/2023 15:12:23

## 2023-03-05 NOTE — Progress Notes (Signed)
[name], [name] ([id number]) [id number] Page 1 of 1 Visit Report for 03/05/2023 SuperBill Details Patient Name: Date of Service: [name], [name] 03/05/2023 Medical Record Number: [id number] Patient Account Number: [id number] Date of Birth/Sex: Treating RN: 1935-04-30 (87 y.o. [name] Primary Care Provider: Renford Dills Other Clinician: Haywood Pao Referring Provider: Treating Provider/Extender: Layla Maw in Treatment: 3 Diagnosis Coding ICD-10 Codes Code Description N30.41 Irradiation cystitis with hematuria C61 Malignant neoplasm of prostate E11.9 Type 2 diabetes mellitus without complications Facility Procedures CPT4 Code Description Modifier Quantity [id number] G0277-(Facility Use Only) HBOT full body chamber, , 4 ICD-10 Diagnosis Description N30.41 Irradiation cystitis with hematuria C61 Malignant neoplasm of prostate E11.9 Type 2 diabetes mellitus without complications Physician Procedures Quantity CPT4 Code Description Modifier [id number] - WC PHYS HYPERBARIC OXYGEN THERAPY 1 ICD-10 Diagnosis Description N30.41 Irradiation cystitis with hematuria C61 Malignant neoplasm of prostate E11.9 Type 2 diabetes mellitus without complications Electronic Signature(s) Signed: 03/05/2023 3:47:10 PM By: Haywood Pao CHT EMT BS , , Signed: 03/05/2023 3:52:35 PM By: Duanne Guess MD FACS Entered By: Haywood Pao on 03/05/2023 15:47:10

## 2023-03-05 NOTE — Progress Notes (Addendum)
PAULMICHAEL, MANGIAPANE ([id number]) [id number] Page 1 of 2 Visit Report for 03/05/2023 HBO Details Patient Name: Date of Service: [name], [name] 03/05/2023 12:30 PM Medical Record Number: [id number] Patient Account Number: [id number] Date of Birth/Sex: Treating RN: [birth year] (87 y.o. 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 HBO Treatment Details Treatment Number: 6 Patient Type: Outpatient Chamber Type: Monoplace Chamber Serial #: [id number] Treatment Protocol: 2.0 ATA with 90 minutes oxygen, and no air breaks Treatment Details Compression Rate Down: 1.0 psi / minute De-Compression Rate Up: 1.5 psi / minute Air breaks and breathing Decompress Decompress Compress Tx Pressure Begins Reached periods Begins Ends (leave unused spaces blank) Chamber Pressure (ATA 1 2 ------2 1 ) Clock Time (24 hr) 13:17 13:32 - - - - - - 15:02 15:13 Treatment Length: 116 (minutes) Treatment Segments: 4 Vital Signs Capillary Blood Glucose Reference Range: 80 - 120 mg / dl HBO Diabetic Blood Glucose Intervention Range: <131 mg/dl or >308 mg/dl Type: Time Vitals Blood Pulse: Respiratory Temperature: Capillary Blood Glucose Pulse Action Taken: Pressure: Rate: Glucose (mg/dl): Meter #: Oximetry (%) Taken: Pre 12:36 130/87 126 18 98.2 131 99 provided hydration, informed physician. Post 15:21 135/60 57 18 97.3 18 asymptomatic for bradycardia Treatment Response Treatment Toleration: Well Treatment Completion Status: Treatment Completed without Adverse Event Treatment Notes Mr. Woolman arrived with blood glucose of  131 mg/dL. He prepared for treatment. Bedside, vital signs were measured with heart rate of 126 via machine, 128 bpm measured by SpO2, and 126 bpm manual count. He stated that he took all of his medications, was not anxious, possibly not hydrated as well as we would like. Patient was given 8 oz water. He drank approximately 6 oz. Dr. Lady Gary was informed of heart rate, she spoke with the patient. All other vital signs were within normal range. He stated that he ate meal of meatloaf, mashed potatoes, and stuffing. He was cleared for treatment. He was borderline on blood glucose level at 131 mg/dL. We elected to send 8 oz orange juice in with patient for safety/intervention. After performing a safety check, patient was placed in the chamber which was compressed with 100% oxygen at the rate of 1 psi/min with 480 L/min which effectively reduces actual travel rate to 0.74 psi/min. Travel rate was increased to normal 1.5 psi/min by decreasing ventilation rate in increments of 5 psig ultimately reaching 275 L/min and an increase in rate set. He tolerated the treatment and subsequent decompression of the chamber at the rate of 1.5 psi/min. He denied any issues with ear equalization and/or pain associated with barotrauma. Post-treatment vital signs included 57 bpm heart rate to which he denied symptoms of bradycardia. He also had blood glucose level of 88 mg/dL. He was given 8 oz Glucerna at 1522. He was stable upon discharge with his wife approximately 48. Physician HBO Attestation: I certify that I supervised this HBO treatment in accordance with Medicare guidelines. A trained emergency response team is readily available per Yes hospital policies and procedures. Continue HBOT as ordered. [address] DELOREAN, DUFFY ([id number]) [id number] Page 2 of 2 Electronic Signature(s) Signed: 03/05/2023 3:53:33 PM By: Duanne Guess MD FACS Previous Signature: 03/05/2023 3:46:55 PM Version By:  Haywood Pao CHT EMT BS , ,  Entered By: Duanne Guess on 03/05/2023 15:53:14 -------------------------------------------------------------------------------- HBO Safety Checklist Details Patient Name: Date of Service: [name], [name] 03/05/2023 12:30 PM Medical Record Number: [id number] Patient Account Number: [id number] Date of Birth/Sex: Treating RN: April 26, 1935 (87 y.o. [name] Primary Care Mancel Lardizabal: Renford Dills Other Clinician: Haywood Pao Referring [employer]: Treating Cathye Kreiter/Extender: Layla Maw in Treatment: 3 HBO Safety Checklist Items Safety Checklist Consent Form Signed Patient voided / foley secured and emptied When did you last eato 1130 - Meatloaf, mash potatoes, stuffing Last dose of injectable or oral agent 1100 Ostomy pouch emptied and vented if applicable NA All implantable devices assessed, documented and approved NA Freestyle Libre 2 and3 on approved list Intravenous access site secured and place NA Valuables secured Linens and cotton and cotton/polyester blend (less than 51% polyester) Personal oil-based products / skin lotions / body lotions removed Wigs or hairpieces removed NA Smoking or tobacco materials removed NA Books / newspapers / magazines / loose paper removed Cologne, aftershave, perfume and deodorant removed Jewelry removed (may wrap wedding band) Make-up removed NA Hair care products removed Battery operated devices (external) removed Heating patches and chemical warmers removed Titanium eyewear removed Nail polish cured greater than 10 hours NA Casting material cured greater than 10 hours NA Hearing aids removed NA Loose dentures or partials removed NA Prosthetics have been removed NA Patient demonstrates correct use of air break device (if applicable) Patient concerns have been addressed Patient grounding bracelet on and cord attached to chamber Specifics for Inpatients  (complete in addition to above) Medication sheet sent with patient NA Intravenous medications needed or due during therapy sent with patient NA Drainage tubes (e.g. nasogastric tube or chest tube secured and vented) NA Endotracheal or Tracheotomy tube secured NA Cuff deflated of air and inflated with saline NA Airway suctioned NA Notes Paper version used prior to treatment start. Electronic Signature(s) Signed: 03/05/2023 3:31:25 PM By: Haywood Pao CHT EMT BS , , Entered By: Haywood Pao on 03/05/2023 15:31:25

## 2023-03-06 ENCOUNTER — Encounter (HOSPITAL_BASED_OUTPATIENT_CLINIC_OR_DEPARTMENT_OTHER): Payer: Medicare Other | Admitting: General Surgery

## 2023-03-06 DIAGNOSIS — N3041 Irradiation cystitis with hematuria: Secondary | ICD-10-CM | POA: Diagnosis not present

## 2023-03-06 DIAGNOSIS — E119 Type 2 diabetes mellitus without complications: Secondary | ICD-10-CM | POA: Diagnosis not present

## 2023-03-06 DIAGNOSIS — C61 Malignant neoplasm of prostate: Secondary | ICD-10-CM | POA: Diagnosis not present

## 2023-03-06 DIAGNOSIS — L598 Other specified disorders of the skin and subcutaneous tissue related to radiation: Secondary | ICD-10-CM | POA: Diagnosis not present

## 2023-03-06 LAB — GLUCOSE, CAPILLARY
Glucose-Capillary: 176 mg/dL — ABNORMAL HIGH (ref 70–99)
Glucose-Capillary: 106 mg/dL — ABNORMAL HIGH (ref 70–99)

## 2023-03-06 NOTE — Progress Notes (Signed)
[name], [name] ([id number]) [id number] Page 1 of 2 Visit Report for 03/06/2023 HBO Details Patient Name: Date of Service: [name], [name] 03/06/2023 12:30 PM Medical Record Number: [id number] Patient Account Number: [id number] Date of Birth/Sex: Treating RN: 03/17/1936 (87 y.o. 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 HBO Treatment Details Treatment Number: 7 Patient Type: Outpatient Chamber Type: Monoplace Chamber Serial #: [id number] Treatment Protocol: 2.0 ATA with 90 minutes oxygen, and no air breaks Treatment Details Compression Rate Down: 1.0 psi / minute De-Compression Rate Up: 1.5 psi / minute Air breaks and breathing Decompress Decompress Compress Tx Pressure Begins Reached periods Begins Ends (leave unused spaces blank) Chamber Pressure (ATA 1 2 ------2 1 ) Clock Time (24 hr) 13:00 13:17 - - - - - - 14:47 15:03 Treatment Length: 123 (minutes) Treatment Segments: 4 Vital Signs Capillary Blood Glucose Reference Range: 80 - 120 mg / dl HBO Diabetic Blood Glucose Intervention Range: <131 mg/dl or >253 mg/dl Type: Time Vitals Blood Pulse: Respiratory Temperature: Capillary Blood Glucose Pulse Action Taken: Pressure: Rate: Glucose (mg/dl): Meter #: Oximetry (%) Taken: Pre 12:54 161/65 58 18 97.9 176 asymptomatic for bradycardia Post 15:04 147/57 51 18 97.4 106 asymptomatic for hypotension, bradycardia Treatment Response Treatment Toleration: Well Treatment Completion Status: Treatment Completed without Adverse Event Treatment Notes [name] arrived with vital signs that  were within normal range. He stated that he ate a meal of meatloaf sandwich today. After performing a safety check, patient was placed in the chamber which was compressed with 100% oxygen at the rate of 1 psi/min with 480 L/min which effectively reduces actual travel rate to 0.74 psi/min. Travel rate was increased to normal 1.5 psi/min by decreasing ventilation rate in increments of 5 psig ultimately reaching 275 L/min and an increase in rate set. He tolerated the treatment and subsequent decompression of the chamber at the rate of 1.5 psi/min. He denied any issues with ear equalization and/or pain associated with barotrauma. Post-treatment vital signs included 51 bpm heart rate to which he denied symptoms of bradycardia. He also had a diastolic BP of 57 mmHg. He denied symptoms related to hypotension. He was stable upon discharge with his wife. Physician HBO Attestation: I certify that I supervised this HBO treatment in accordance with Medicare guidelines. A trained emergency response team is readily available per Yes hospital policies and procedures. Continue HBOT as ordered. Yes Electronic Signature(s) Signed: 03/06/2023 5:19:36 PM By: Haywood Pao CHT EMT BS , , Signed: 03/07/2023 7:41:31 AM By: Duanne Guess MD FACS Perry, Galisteo S ([id number]) AM By: Duanne Guess MD FACS [id number].pdf Page 2 of 2 Signed: 03/07/2023 7:41:31 Entered By: Haywood Pao on 03/06/2023 17:19:36 -------------------------------------------------------------------------------- HBO Safety Checklist Details Patient Name: Date of Service: [name], [name] 03/06/2023 12:30 PM Medical Record Number: [id number] Patient Account Number: [id number] Date of Birth/Sex: Treating RN: [birth year] (87 y.o. Tammy Sours Primary Care Arvle Grabe: Renford Dills Other Clinician: Haywood Pao Referring Johnson Arizola: Treating Anitra Doxtater/Extender: Layla Maw in  Treatment: 3 HBO Safety Checklist Items Safety Checklist Consent Form Signed Patient voided / foley secured and emptied Foley When did you last eato 1130 - Meatloaf sandwich Last dose of injectable or oral agent 1115 - Humalog 75/25, Metformin Ostomy  pouch emptied and vented if applicable NA All implantable devices assessed, documented and approved Freestyle Libre 2 and3 on approved list Intravenous access site secured and place NA Valuables secured NA Linens and cotton and cotton/polyester blend (less than 51% polyester) Personal oil-based products / skin lotions / body lotions removed Wigs or hairpieces removed NA Smoking or tobacco materials removed NA Books / newspapers / magazines / loose paper removed Cologne, aftershave, perfume and deodorant removed Jewelry removed (may wrap wedding band) Make-up removed NA Hair care products removed Battery operated devices (external) removed Heating patches and chemical warmers removed Titanium eyewear removed Nail polish cured greater than 10 hours NA Casting material cured greater than 10 hours NA Hearing aids removed NA Loose dentures or partials removed NA Prosthetics have been removed NA Patient demonstrates correct use of air break device (if applicable) Patient concerns have been addressed Patient grounding bracelet on and cord attached to chamber Specifics for Inpatients (complete in addition to above) Medication sheet sent with patient NA Intravenous medications needed or due during therapy sent with patient NA Drainage tubes (e.g. nasogastric tube or chest tube secured and vented) NA Endotracheal or Tracheotomy tube secured NA Cuff deflated of air and inflated with saline NA Airway suctioned NA Notes Paper version used prior to treatment start. Electronic Signature(s) Signed: 03/06/2023 2:41:52 PM By: Haywood Pao CHT EMT BS , , Entered By: Haywood Pao on 03/06/2023 14:41:52

## 2023-03-07 ENCOUNTER — Encounter (HOSPITAL_BASED_OUTPATIENT_CLINIC_OR_DEPARTMENT_OTHER): Payer: Medicare Other | Admitting: General Surgery

## 2023-03-07 DIAGNOSIS — C61 Malignant neoplasm of prostate: Secondary | ICD-10-CM | POA: Diagnosis not present

## 2023-03-07 DIAGNOSIS — L598 Other specified disorders of the skin and subcutaneous tissue related to radiation: Secondary | ICD-10-CM | POA: Diagnosis not present

## 2023-03-07 DIAGNOSIS — N3041 Irradiation cystitis with hematuria: Secondary | ICD-10-CM | POA: Diagnosis not present

## 2023-03-07 DIAGNOSIS — E119 Type 2 diabetes mellitus without complications: Secondary | ICD-10-CM | POA: Diagnosis not present

## 2023-03-07 LAB — GLUCOSE, CAPILLARY
Glucose-Capillary: 135 mg/dL — ABNORMAL HIGH (ref 70–99)
Glucose-Capillary: 103 mg/dL — ABNORMAL HIGH (ref 70–99)

## 2023-03-07 NOTE — Progress Notes (Signed)
[name], [name] ([id number]) [id number] Page 1 of 2 Visit Report for 03/06/2023 Arrival Information Details Patient Name: Date of Service: [name], [name] 03/06/2023 12:30 PM Medical Record Number: [id number] Patient Account Number: [id number] Date of Birth/Sex: Treating RN: 06/12/1935 (87 y.o. [name], [name] Primary Care Arlie Posch: Renford Dills Other Clinician: Haywood Pao Referring Naeem Quillin: Treating Even Budlong/Extender: Layla Maw in Treatment: 3 Visit Information History Since Last Visit All ordered tests and consults were completed: Yes Patient Arrived: [name] Added or deleted any medications: No Arrival Time: 12:18 Any new allergies or adverse reactions: No Accompanied By: spouse Had a fall or experienced change in No Transfer Assistance: None activities of daily living that may affect Patient Identification Verified: Yes risk of falls: Secondary Verification Process Completed: Yes Signs or symptoms of abuse/neglect since last visito No Patient Requires Transmission-Based Precautions: No Hospitalized since last visit: No Patient Has Alerts: No Implantable device outside of the clinic excluding No cellular tissue based products placed in the center since last visit: Pain Present Now: No Electronic Signature(s) Signed: 03/06/2023 2:39:06 PM By: Haywood Pao CHT EMT BS , , Entered By: Haywood Pao on 03/06/2023 14:39:06 -------------------------------------------------------------------------------- Encounter Discharge Information Details Patient Name: Date of Service: [name], [name] 03/06/2023 12:30 PM Medical Record Number: [id number] Patient Account Number: [id number] Date of Birth/Sex: Treating RN: [birth year] (87 y.o. [name] Primary Care Shevelle Smither: Renford Dills Other Clinician: Haywood Pao Referring Montrice Gracey: Treating Ilze Roselli/Extender: Layla Maw in Treatment: 3 Encounter Discharge Information Items Discharge Condition: Stable Ambulatory Status: Walker Discharge Destination: Home Transportation: Private Auto Accompanied By: spouse Schedule Follow-up Appointment: No Clinical Summary of Care: Electronic Signature(s) Signed: 03/06/2023 5:20:26 PM By: Haywood Pao CHT EMT BS , , Previous Signature: 03/06/2023 5:20:11 PM Version By: Haywood Pao CHT EMT BS , , Entered By: Haywood Pao on 03/06/2023 17:20:25 [name] ([id number]) [id number] Page 2 of 2 -------------------------------------------------------------------------------- Vitals Details Patient Name: Date of Service: [name], [name] 03/06/2023 12:30 PM Medical Record Number: [id number] Patient Account Number: [id number] Date of Birth/Sex: Treating RN: Sep 17, 1935 (87 y.o. [name] Primary Care Nitika Jackowski: Renford Dills Other Clinician: Haywood Pao Referring Jensine Luz: Treating Aleathea Pugmire/Extender: Layla Maw in Treatment: 3 Vital Signs Time Taken: 12:54 Temperature (F): 97.9 Height (in): 66 Pulse (bpm): 58 Weight (lbs): 150 Respiratory Rate (breaths/min): 18 Body Mass Index (BMI): 24.2 Blood Pressure (mmHg): 161/65 Capillary Blood Glucose (mg/dl): 353 Reference Range: 80 - 120 mg / dl Electronic Signature(s) Signed: 03/06/2023 2:39:38 PM By: Haywood Pao CHT EMT BS , , Entered By: Haywood Pao on 03/06/2023 14:39:38

## 2023-03-07 NOTE — Progress Notes (Signed)
JAELAN, FORYS ([id number]) [id number] Page 1 of 1 Visit Report for 03/06/2023 SuperBill Details Patient Name: Date of Service: [name], [name] 03/06/2023 Medical Record Number: [id number] Patient Account Number: [id number] Date of Birth/Sex: Treating RN: 10/19/1935 (87 y.o. Tammy Sours Primary Care Provider: Renford Dills Other Clinician: Haywood Pao Referring Provider: Treating Provider/Extender: Layla Maw in Treatment: 3 Diagnosis Coding ICD-10 Codes Code Description N30.41 Irradiation cystitis with hematuria C61 Malignant neoplasm of prostate E11.9 Type 2 diabetes mellitus without complications Facility Procedures CPT4 Code Description Modifier Quantity 36644034 G0277-(Facility Use Only) HBOT full body chamber, , 4 ICD-10 Diagnosis Description N30.41 Irradiation cystitis with hematuria C61 Malignant neoplasm of prostate E11.9 Type 2 diabetes mellitus without complications Physician Procedures Quantity CPT4 Code Description Modifier [id number] 99183 - WC PHYS HYPERBARIC OXYGEN THERAPY 1 ICD-10 Diagnosis Description N30.41 Irradiation cystitis with hematuria C61 Malignant neoplasm of prostate E11.9 Type 2 diabetes mellitus without complications Electronic Signature(s) Signed: 03/06/2023 5:19:51 PM By: Haywood Pao CHT EMT BS , , Signed: 03/07/2023 7:41:31 AM By: Duanne Guess MD FACS Entered By: Haywood Pao on 03/06/2023 17:19:51

## 2023-03-08 ENCOUNTER — Encounter (HOSPITAL_BASED_OUTPATIENT_CLINIC_OR_DEPARTMENT_OTHER): Payer: Medicare Other | Admitting: General Surgery

## 2023-03-08 ENCOUNTER — Ambulatory Visit (HOSPITAL_BASED_OUTPATIENT_CLINIC_OR_DEPARTMENT_OTHER): Payer: Medicare Other | Admitting: Internal Medicine

## 2023-03-08 DIAGNOSIS — E119 Type 2 diabetes mellitus without complications: Secondary | ICD-10-CM | POA: Diagnosis not present

## 2023-03-08 DIAGNOSIS — C61 Malignant neoplasm of prostate: Secondary | ICD-10-CM | POA: Diagnosis not present

## 2023-03-08 DIAGNOSIS — N3041 Irradiation cystitis with hematuria: Secondary | ICD-10-CM | POA: Diagnosis not present

## 2023-03-08 LAB — GLUCOSE, CAPILLARY: Glucose-Capillary: 125 mg/dL — ABNORMAL HIGH (ref 70–99)

## 2023-03-08 NOTE — Progress Notes (Signed)
[name], [name] ([id number]) [id number] Page 1 of 1 Visit Report for 03/07/2023 SuperBill Details Patient Name: Date of Service: [name], [name] 03/07/2023 Medical Record Number: [id number] Patient Account Number: [id number] Date of Birth/Sex: Treating RN: Apr 23, 1935 (87 y.o. [name] Primary Care Provider: Renford Dills Other Clinician: Haywood Pao Referring Provider: Treating Provider/Extender: Layla Maw in Treatment: 4 Diagnosis Coding ICD-10 Codes Code Description N30.41 Irradiation cystitis with hematuria C61 Malignant neoplasm of prostate E11.9 Type 2 diabetes mellitus without complications Facility Procedures CPT4 Code Description Modifier Quantity 44010272 G0277-(Facility Use Only) HBOT full body chamber, , 4 ICD-10 Diagnosis Description N30.41 Irradiation cystitis with hematuria C61 Malignant neoplasm of prostate E11.9 Type 2 diabetes mellitus without complications Physician Procedures Quantity CPT4 Code Description Modifier [id number] 99183 - WC PHYS HYPERBARIC OXYGEN THERAPY 1 ICD-10 Diagnosis Description N30.41 Irradiation cystitis with hematuria C61 Malignant neoplasm of prostate E11.9 Type 2 diabetes mellitus without complications Electronic Signature(s) Signed: 03/07/2023 4:46:54 PM By: Haywood Pao CHT EMT BS , , Signed: 03/08/2023 8:41:18 AM By: Duanne Guess MD FACS Entered By: Haywood Pao on 03/07/2023 13:46:54

## 2023-03-08 NOTE — Progress Notes (Addendum)
PIO, STADTLANDER ([id number]) [id number] Page 1 of 2 Visit Report for 03/07/2023 HBO Details Patient Name: Date of Service: [name], [name] 03/07/2023 12:30 PM Medical Record Number: [id number] Patient Account Number: [id number] Date of Birth/Sex: Treating RN: 1935/05/03 (87 y.o. 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 HBO Treatment Details Treatment Number: 8 Patient Type: Outpatient Chamber Type: Monoplace Chamber Serial #: [id number] Treatment Protocol: 2.0 ATA with 90 minutes oxygen, and no air breaks Treatment Details Compression Rate Down: 1.0 psi / minute De-Compression Rate Up: 1.5 psi / minute Air breaks and breathing Decompress Decompress Compress Tx Pressure Begins Reached periods Begins Ends (leave unused spaces blank) Chamber Pressure (ATA 1 2 ------2 1 ) Clock Time (24 hr) 12:58 13:13 - - - - - - 14:43 14:53 Treatment Length: 115 (minutes) Treatment Segments: 4 Vital Signs Capillary Blood Glucose Reference Range: 80 - 120 mg / dl HBO Diabetic Blood Glucose Intervention Range: <131 mg/dl or >696 mg/dl Type: Time Vitals Blood Pulse: Respiratory Temperature: Capillary Blood Glucose Pulse Action Taken: Pressure: Rate: Glucose (mg/dl): Meter #: Oximetry (%) Taken: Pre 12:27 123/58 59 18 98.1 135 asymptomatic for bradycardia/hypotension Post 14:55 143/60 54 18 98 103 asymptomatic for bradycardia Treatment Response Treatment Toleration: Well Treatment Completion Status: Treatment Completed without Adverse Event Treatment Notes Mr. [name] arrived with vital signs that  included heart rate of 59 and diastolic BP of 58 mmHg. He stated that he ate a meal of smoke [place] cheese sandwich; oatmeal for breakfast. After performing a safety check, patient was placed in the chamber which was compressed with 100% oxygen at the rate of 1.5 psi/min after confirming normal ear equalization. He tolerated the treatment and subsequent decompression of the chamber at the rate of 1.5 psi/min. He denied any issues with ear equalization and/or pain associated with barotrauma. Post-treatment vital signs included 54 bpm heart rate to which he denied symptoms of bradycardia. He was stable upon discharge with his wife. Electronic Signature(s) Signed: 03/07/2023 4:46:41 PM By: Haywood Pao CHT EMT BS , , Signed: 03/08/2023 8:41:18 AM By: Duanne Guess MD FACS Entered By: Haywood Pao on 03/07/2023 13:46:41 Tolleson, [name] ([id number]) [id number] Page 2 of 2 -------------------------------------------------------------------------------- HBO Safety Checklist Details Patient Name: Date of Service: [name], [name] 03/07/2023 12:30 PM Medical Record Number: [id number] Patient Account Number: [id number] Date of Birth/Sex: Treating RN: Aug 15, 1935 (87 y.o. [name] Primary Care Navdeep Fessenden: Renford Dills Other Clinician: Haywood Pao Referring Leith Szafranski: Treating Tranell Wojtkiewicz/Extender: Layla Maw in Treatment: 4 HBO Safety Checklist Items Safety Checklist Consent Form Signed Patient voided / foley secured and emptied Foley 1130 smoke [place], cheese, spicy pickle When did you last eato sandwich Last dose of injectable or oral agent 1100 Ostomy pouch emptied and vented if applicable NA All implantable devices assessed, documented and approved Freestyle Libre 2 and3 on approved list Intravenous access site secured and place NA Valuables secured Linens and cotton and cotton/polyester blend (less than 51%  polyester) Personal oil-based products / skin lotions / body lotions removed Wigs or hairpieces removed NA Smoking or tobacco materials removed NA Books / newspapers / magazines / loose paper removed Cologne, aftershave, perfume and deodorant removed Jewelry removed (may wrap wedding band) Make-up removed  NA Hair care products removed NA Libre Freestyle 2 and 3 Sensor Approved, other Battery operated devices (external) removed electronics removed. Heating patches and chemical warmers removed Titanium eyewear removed Nail polish cured greater than 10 hours NA Casting material cured greater than 10 hours NA Hearing aids removed NA Loose dentures or partials removed NA Prosthetics have been removed NA Patient demonstrates correct use of air break device (if applicable) Patient concerns have been addressed Patient grounding bracelet on and cord attached to chamber Specifics for Inpatients (complete in addition to above) Medication sheet sent with patient NA Intravenous medications needed or due during therapy sent with patient NA Drainage tubes (e.g. nasogastric tube or chest tube secured and vented) NA Endotracheal or Tracheotomy tube secured NA Cuff deflated of air and inflated with saline NA Airway suctioned NA Notes Paper version used prior to treatment start. Electronic Signature(s) Signed: 03/07/2023 4:43:13 PM By: Haywood Pao CHT EMT BS , , Entered By: Haywood Pao on 03/07/2023 13:43:13

## 2023-03-08 NOTE — Progress Notes (Signed)
DIMAGGIO, WINEY ([id number]) [id number] Page 1 of 2 Visit Report for 03/07/2023 Arrival Information Details Patient Name: Date of Service: [name], [name] 03/07/2023 12:30 PM Medical Record Number: [id number] Patient Account Number: [id number] Date of Birth/Sex: Treating RN: 08-Jan-1936 (87 y.o. [name] Primary Care [name]: [name] Other Clinician: Haywood [name] Referring Rosslyn Pasion: Treating [name]/Extender: [name] in Treatment: 4 Visit Information History Since Last Visit All ordered tests and consults were completed: Yes Patient Arrived: [name] Added or deleted any medications: No Arrival Time: 12:30 Any new allergies or adverse reactions: No Accompanied By: self Had a fall or experienced change in No Transfer Assistance: None activities of daily living that may affect Patient Identification Verified: Yes risk of falls: Secondary Verification Process Completed: Yes Signs or symptoms of abuse/neglect since last visito No Patient Requires Transmission-Based Precautions: No Hospitalized since last visit: No Patient Has Alerts: No Implantable device outside of the clinic excluding No cellular tissue based products placed in the center since last visit: Pain Present Now: No Electronic Signature(s) Signed: 03/07/2023 4:41:18 PM By: [name] CHT EMT BS , , Entered By: [name] on 03/07/2023 13:41:18 -------------------------------------------------------------------------------- Encounter Discharge Information Details Patient Name: Date of Service: [name], [name] 03/07/2023 12:30 PM Medical Record Number: [id number] Patient Account Number: [id number] Date of Birth/Sex: Treating RN: 1935/10/09 (87 y.o. [name] Primary Care Jermeka Schlotterbeck: [name] Other Clinician: Haywood [name] Referring Seven Maren[place]: Treating Lynk Marti/Extender: [name] in Treatment: 4 Encounter Discharge Information Items Discharge Condition: Stable Ambulatory Status: Walker Discharge Destination: Home Transportation: Private Auto Accompanied By: spouse Schedule Follow-up Appointment: No Clinical Summary of Care: Electronic Signature(s) Signed: 03/07/2023 4:47:13 PM By: [name] CHT EMT BS , , Entered By: [name] on 03/07/2023 13:47:13 [name] ([id number]) [id number] Page 2 of 2 -------------------------------------------------------------------------------- Vitals Details Patient Name: Date of Service: [name], [name] 03/07/2023 12:30 PM Medical Record Number: [id number] Patient Account Number: [id number] Date of Birth/Sex: Treating RN: September 14, 1935 (87 y.o. [name] Primary Care Laisha Rau: [name] Other Clinician: Haywood [name] Referring Damauri Minion: Treating Frankie Scipio/Extender: [name] in Treatment: 4 Vital Signs Time Taken: 12:27 Temperature (F): 98.1 Height (in): 66 Pulse (bpm): 59 Weight (lbs): 150 Respiratory Rate (breaths/min): 18 Body Mass Index (BMI): 24.2 Blood Pressure (mmHg): 123/58 Capillary Blood Glucose (mg/dl): 315 Reference Range: 80 - 120 mg / dl Electronic Signature(s) Signed: 03/07/2023 4:41:42 PM By: [name] CHT EMT BS , , Entered By: [name] on 03/07/2023 13:41:42

## 2023-03-09 ENCOUNTER — Encounter (HOSPITAL_BASED_OUTPATIENT_CLINIC_OR_DEPARTMENT_OTHER): Payer: Medicare Other | Admitting: General Surgery

## 2023-03-09 DIAGNOSIS — E119 Type 2 diabetes mellitus without complications: Secondary | ICD-10-CM | POA: Diagnosis not present

## 2023-03-09 DIAGNOSIS — N3041 Irradiation cystitis with hematuria: Secondary | ICD-10-CM | POA: Diagnosis not present

## 2023-03-09 DIAGNOSIS — C61 Malignant neoplasm of prostate: Secondary | ICD-10-CM | POA: Diagnosis not present

## 2023-03-09 DIAGNOSIS — L598 Other specified disorders of the skin and subcutaneous tissue related to radiation: Secondary | ICD-10-CM | POA: Diagnosis not present

## 2023-03-09 LAB — GLUCOSE, CAPILLARY
Glucose-Capillary: 110 mg/dL — ABNORMAL HIGH (ref 70–99)
Glucose-Capillary: 114 mg/dL — ABNORMAL HIGH (ref 70–99)
Glucose-Capillary: 109 mg/dL — ABNORMAL HIGH (ref 70–99)
Glucose-Capillary: 148 mg/dL — ABNORMAL HIGH (ref 70–99)

## 2023-03-09 NOTE — Progress Notes (Signed)
STYLIANOS, HAYES ([id number]) [id number] Page 1 of 4 Visit Report for 03/08/2023 Arrival Information Details Patient Name: Date of Service: [name], [name] 03/08/2023 12:30 PM Medical Record Number: [id number] Patient Account Number: [id number] Date of Birth/Sex: Treating RN: [birth year] (87 y.o. M) Primary Care Neil Errickson: Renford Dills Other Clinician: Referring Kenyonna Micek: Treating Cache Decoursey/Extender: Layla Maw in Treatment: 4 Visit Information History Since Last Visit All ordered tests and consults were completed: Yes Patient Arrived: [name] Added or deleted any medications: No Arrival Time: 14:14 Any new allergies or adverse reactions: No Accompanied By: self Had a fall or experienced change in No Transfer Assistance: None activities of daily living that may affect Patient Identification Verified: Yes risk of falls: Secondary Verification Process Completed: Yes Signs or symptoms of abuse/neglect since last visito No Patient Requires Transmission-Based Precautions: No Hospitalized since last visit: No Patient Has Alerts: No Implantable device outside of the clinic excluding No cellular tissue based products placed in the center since last visit: Pain Present Now: No Electronic Signature([name]) Signed: 03/08/2023 3:45:49 PM By: Haywood Pao CHT EMT BS , , Entered By: Haywood Pao on 03/08/2023 15:45:49 -------------------------------------------------------------------------------- Clinic Level of Care Assessment Details Patient Name: Date of Service: [facility] 03/08/2023 12:30 PM Medical Record Number: [id number] Patient Account Number: [id number] Date of Birth/Sex: Treating RN: 1935/11/13 (87 y.o. Tammy Sours Primary Care Huston Stonehocker: Renford Dills Other Clinician: Haywood Pao Referring Rolfe Hartsell: Treating Hollie Wojahn/Extender: Layla Maw in Treatment: 4 Clinic  Level of Care Assessment Items TOOL 4 Quantity Score X- 1 0 Use when only an EandM is performed on FOLLOW-UP visit ASSESSMENTS - Nursing Assessment / Reassessment []  - 0 Reassessment of Co-morbidities (includes updates in patient status) []  - 0 Reassessment of Adherence to Treatment Plan ASSESSMENTS - Wound and Skin A ssessment / Reassessment []  - 0 Simple Wound Assessment / Reassessment - one wound []  - 0 Complex Wound Assessment / Reassessment - multiple wounds []  - 0 Dermatologic / Skin Assessment (not related to wound area) ASSESSMENTS - Focused Assessment []  - 0 Circumferential Edema Measurements - multi extremities []  - 0 Nutritional Assessment / Counseling / Intervention [name], [name] ([id number]) [id number].pdf Page 2 of 4 []  - 0 Lower Extremity Assessment (monofilament, tuning fork, pulses) []  - 0 Peripheral Arterial Disease Assessment (using hand held doppler) ASSESSMENTS - Ostomy and/or Continence Assessment and Care []  - 0 Incontinence Assessment and Management []  - 0 Ostomy Care Assessment and Management (repouching, etc.) PROCESS - Coordination of Care []  - 0 Simple Patient / Family Education for ongoing care []  - 0 Complex (extensive) Patient / Family Education for ongoing care X- 1 10 Staff obtains Chiropractor, Records, T Results / Process Orders est []  - 0 Staff telephones HHA, Nursing Homes / Clarify orders / etc []  - 0 Routine Transfer to another Facility (non-emergent condition) []  - 0 Routine Hospital Admission (non-emergent condition) []  - 0 New Admissions / Manufacturing engineer / Ordering NPWT Apligraf, etc. , []  - 0 Emergency Hospital Admission (emergent condition) []  - 0 Simple Discharge Coordination []  - 0 Complex (extensive) Discharge Coordination PROCESS - Special Needs []  - 0 Pediatric / Minor Patient Management []  - 0 Isolation Patient Management []  - 0 Hearing / Language / Visual special needs []  -  0 Assessment of Community assistance (transportation, D/C planning, etc.) []  - 0 Additional assistance / Altered mentation []  - 0 Support Surface([name]) Assessment (bed, cushion, seat, etc.) INTERVENTIONS - Wound Cleansing / Measurement []  - 0  Simple Wound Cleansing - one wound []  - 0 Complex Wound Cleansing - multiple wounds []  - 0 Wound Imaging (photographs - any number of wounds) []  - 0 Wound Tracing (instead of photographs) []  - 0 Simple Wound Measurement - one wound []  - 0 Complex Wound Measurement - multiple wounds INTERVENTIONS - Wound Dressings []  - 0 Small Wound Dressing one or multiple wounds []  - 0 Medium Wound Dressing one or multiple wounds []  - 0 Large Wound Dressing one or multiple wounds []  - 0 Application of Medications - topical []  - 0 Application of Medications - injection INTERVENTIONS - Miscellaneous []  - 0 External ear exam X- 1 5 Specimen Collection (cultures, biopsies, blood, body fluids, etc.) []  - 0 Specimen([name]) / Culture([name]) sent or taken to Lab for analysis []  - 0 Patient Transfer (multiple staff / Nurse, adult / Similar devices) []  - 0 Simple Staple / Suture removal (25 or less) []  - 0 Complex Staple / Suture removal (26 or more) []  - 0 Hypo / Hyperglycemic Management (close monitor of Blood Glucose) [name], [name] ([id number]) [id number] Page 3 of 4 []  - 0 Ankle / Brachial Index (ABI) - do not check if billed separately X- 1 5 Vital Signs Has the patient been seen at the hospital within the last three years: Yes Total Score: 20 Level Of Care: New/Established - Level 1 Electronic Signature([name]) Unsigned Entered By: Haywood Pao on 03/08/2023 16:15:21 -------------------------------------------------------------------------------- Encounter Discharge Information Details Patient Name: Date of Service: [name], [name] 03/08/2023 12:30 PM Medical Record Number: [id number] Patient Account Number:  [id number] Date of Birth/Sex: Treating RN: 09-02-1935 (87 y.o. M) Primary Care Avriel Kandel: Renford Dills Other Clinician: Referring Jaise Moser: Treating Kailee Essman/Extender: Layla Maw in Treatment: 4 Encounter Discharge Information Items Discharge Condition: Stable Ambulatory Status: Walker Discharge Destination: Home Transportation: Private Auto Accompanied By: spouse Schedule Follow-up Appointment: No Clinical Summary of Care: Electronic Signature([name]) Signed: 03/08/2023 4:13:34 PM By: Haywood Pao CHT EMT BS , , Entered By: Haywood Pao on 03/08/2023 16:13:34 -------------------------------------------------------------------------------- General Visit Notes Details Patient Name: Date of Service: [name], [name]. 03/08/2023 12:30 PM Medical Record Number: [id number] Patient Account Number: [id number] Date of Birth/Sex: Treating RN: [birth year] (87 y.o. M) Primary Care Zeanna Sunde: Renford Dills Other Clinician: Referring Rosana Farnell: Treating Jaritza Duignan/Extender: Layla Maw in Treatment: 4 Notes Mr. Demian arrived with vital signs that included heart rate of 54 and diastolic BP of 50 mmHg. He denied symptoms related to bradycardia/hypotension. Secondary manual readings were taken and were within normal range. Blood glucose level was measured at 125 mg/dL at 0932. He was given 8 oz Glucerna and 2 x 4 oz apple juice. He stated that he ate chicken noodle soup for lunch at 1130 and oatmeal for breakfast at 0900. Humalog was taken at 0845 and Metformin at 1100. Blood glucose was retaken at 1308 with result of 114 mg/dL. CBG level decreased; opposed to protocol requirements. Patient chose to wait a short while longer to allow what he consumed to increase blood glucose level (increasing from 114 mg/dL and greater than 355 mg/dL). Blood glucose reading at 1326 was 110 mg/dL. Dr. Lady Gary was informed and treatment was rescheduled for  tomorrow. He was stable upon discharge with his wife. Electronic Signature([name]) Signed: 03/08/2023 4:12:49 PM By: Haywood Pao CHT EMT BS , , Entered By: Haywood Pao on 03/08/2023 16:12:49 Spinella, [name] ([id number]) [id number] Page 4 of 4 -------------------------------------------------------------------------------- Vitals Details Patient Name: Date of Service: [name], [name]. 03/08/2023  12:30 PM Medical Record Number: [id number] Patient Account Number: [id number] Date of Birth/Sex: Treating RN: 11-23-1935 (87 y.o. M) Primary Care Shaquil Aldana: Renford Dills Other Clinician: Referring Fritz Cauthon: Treating Jourdin Connors/Extender: Layla Maw in Treatment: 4 Vital Signs Time Taken: 12:28 Temperature (F): 97.3 Height (in): 66 Pulse (bpm): 54 Weight (lbs): 150 Respiratory Rate (breaths/min): 18 Body Mass Index (BMI): 24.2 Blood Pressure (mmHg): 130/50 Capillary Blood Glucose (mg/dl): 952 Reference Range: 80 - 120 mg / dl Notes Secondary manual BP: 122/64 mmHg, Pulse manual: 60 bpm. Electronic Signature([name]) Signed: 03/08/2023 3:46:52 PM By: Haywood Pao CHT EMT BS , , Entered By: Haywood Pao on 03/08/2023 15:46:52

## 2023-03-10 NOTE — Progress Notes (Signed)
[name], [name] ([id number]) [id number] Page 1 of 2 Visit Report for 03/09/2023 Arrival Information Details Patient Name: Date of Service: [name], [name] 03/09/2023 9:30 A M Medical Record Number: [id number] Patient Account Number: [id number] Date of Birth/Sex: Treating RN: 1935/07/19 (87 y.o. [name], [name] Primary Care Harden Bramer: Renford Dills Other Clinician: Haywood Pao Referring Kijana Cromie: Treating Feliza Diven/Extender: Layla Maw in Treatment: 4 Visit Information History Since Last Visit All ordered tests and consults were completed: Yes Patient Arrived: [name] Added or deleted any medications: No Arrival Time: 08:07 Any new allergies or adverse reactions: No Accompanied By: self Had a fall or experienced change in No Transfer Assistance: None activities of daily living that may affect Patient Identification Verified: Yes risk of falls: Secondary Verification Process Completed: Yes Signs or symptoms of abuse/neglect since last visito No Patient Requires Transmission-Based Precautions: No Hospitalized since last visit: No Patient Has Alerts: No Implantable device outside of the clinic excluding No cellular tissue based products placed in the center since last visit: Pain Present Now: No Electronic Signature(s) Signed: 03/09/2023 1:34:34 PM By: Haywood Pao CHT EMT BS , , Entered By: Haywood Pao on 03/09/2023 13:34:34 -------------------------------------------------------------------------------- Encounter Discharge Information Details Patient Name: Date of Service: [name], [name] 03/09/2023 9:30 A M Medical Record Number: [id number] Patient Account Number: [id number] Date of Birth/Sex: Treating RN: 1936-02-27 (87 y.o. [name] Primary Care Elba Dendinger: Renford Dills Other Clinician: Haywood Pao Referring Terreon Ekholm: Treating Verba Ainley/Extender: Layla Maw in Treatment: 4 Encounter Discharge Information Items Discharge Condition: Stable Ambulatory Status: Ambulatory Discharge Destination: Home Transportation: Private Auto Accompanied By: spouse Schedule Follow-up Appointment: No Clinical Summary of Care: Electronic Signature(s) Signed: 03/09/2023 1:59:05 PM By: Haywood Pao CHT EMT BS , , Entered By: Haywood Pao on 03/09/2023 13:59:05 Crisostomo, [name] ([id number]) [id number] Page 2 of 2 -------------------------------------------------------------------------------- Vitals Details Patient Name: Date of Service: [name], [name] 03/09/2023 9:30 A M Medical Record Number: [id number] Patient Account Number: [id number] Date of Birth/Sex: Treating RN: 12/31/1935 (87 y.o. [name] Primary Care Nickey Canedo: Renford Dills Other Clinician: Haywood Pao Referring Danyel Griess: Treating Sherle Mello/Extender: Layla Maw in Treatment: 4 Vital Signs Time Taken: 08:15 Temperature (F): 97.9 Height (in): 66 Pulse (bpm): 57 Weight (lbs): 150 Respiratory Rate (breaths/min): 18 Body Mass Index (BMI): 24.2 Blood Pressure (mmHg): 131/47 Capillary Blood Glucose (mg/dl): 433 Reference Range: 80 - 120 mg / dl Electronic Signature(s) Signed: 03/09/2023 1:36:35 PM By: Haywood Pao CHT EMT BS , , Entered By: Haywood Pao on 03/09/2023 13:36:35

## 2023-03-10 NOTE — Progress Notes (Signed)
[name], [name] ([id number]) [id number] Page 1 of 2 Visit Report for 03/09/2023 HBO Details Patient Name: Date of Service: [name], [name] 03/09/2023 9:30 A M Medical Record Number: [id number] Patient Account Number: [id number] Date of Birth/Sex: Treating RN: [birth year] (87 y.o. 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 HBO Treatment Details Treatment Number: 9 Patient Type: Outpatient Chamber Type: Monoplace Chamber Serial #: [id number] Treatment Protocol: 2.0 ATA with 90 minutes oxygen, and no air breaks Treatment Details Compression Rate Down: 1.0 psi / minute De-Compression Rate Up: 1.5 psi / minute Air breaks and breathing Decompress Decompress Compress Tx Pressure Begins Reached periods Begins Ends (leave unused spaces blank) Chamber Pressure (ATA 1 2 ------2 1 ) Clock Time (24 hr) 10:10 10:28 - - - - - - 11:58 12:13 Treatment Length: 123 (minutes) Treatment Segments: 4 Vital Signs Capillary Blood Glucose Reference Range: 80 - 120 mg / dl HBO Diabetic Blood Glucose Intervention Range: <131 mg/dl or >027 mg/dl Type: Time Vitals Blood Respiratory Capillary Blood Glucose Pulse Action Pulse: Temperature: Taken: Pressure: Rate: Glucose (mg/dl): Meter #: Oximetry (%) Taken: Pre 08:15 131/47 57 18 97.9 109 re-measured vitals. Pre 10:03 130/64 60 148 manual vital signs. Post 12:15 164/91 62 18 98.1 171 none per protocol. Treatment Response Treatment Toleration: Well Treatment Completion Status: Treatment Completed without Adverse Event Treatment Notes Mr. Keyton arrived with  vital signs that included heart rate of 57 bpm and diastolic BP of 47 mmHg. He stated that he ate a meal of eggs, sausage, grits. Blood glucose level was 109 mg/dL. He was given 8 oz Glucerna and 2 x 4 ounce apple juices. Secondary vital signs included manual BP 130/64, manual pulse 60 bpm, and blood glucose of 148 mg/dL. After performing a safety check, patient was placed in the chamber which was compressed with 100% oxygen at the rate of 1.5 psi/min after confirming normal ear equalization (gradual from slowest speed possible). He tolerated the treatment and subsequent decompression of the chamber at the rate of 1.5 psi/min. He denied any issues with ear equalization and/or pain associated with barotrauma. Post-treatment vital signs were within normal range. He was stable upon discharge with his wife. Physician HBO Attestation: I certify that I supervised this HBO treatment in accordance with Medicare guidelines. A trained emergency response team is readily available per Yes hospital policies and procedures. Continue HBOT as ordered. Yes Electronic Signature(s) Signed: 03/12/2023 7:47:30 AM By: Duanne Guess MD FACS [place], Viola S ([id number]) AM By: Duanne Guess MD FACS [phone].pdf Page 2 of 2 Signed: 03/12/2023 7:47:30 Previous Signature: 03/09/2023 1:51:14 PM Version By: Haywood Pao CHT EMT BS , , Previous Signature: 03/09/2023 1:50:34 PM Version By: Haywood Pao CHT EMT BS , , Entered By: Duanne Guess on 03/12/2023 04:46:44 -------------------------------------------------------------------------------- HBO Safety Checklist Details Patient Name: Date of Service: [name], [name] 03/09/2023 9:30 A M Medical Record Number: [id number] Patient Account Number: [id number] Date of Birth/Sex: Treating RN: [birth year] (87 y.o. [name] Primary Care Herman Fiero: Renford Dills Other Clinician: Haywood Pao Referring  Ziv Welchel: Treating Macarena Langseth/Extender: Layla Maw in Treatment: 4 HBO Safety Checklist Items Safety Checklist Consent Form Signed Patient voided / foley secured and emptied When did you last eato 0900 Last  dose of injectable or oral agent 0830 humalog, metformin Ostomy pouch emptied and vented if applicable NA All implantable devices assessed, documented and approved Freestyle Libre 2 and3 on approved list Intravenous access site secured and place NA Valuables secured Linens and cotton and cotton/polyester blend (less than 51% polyester) Personal oil-based products / skin lotions / body lotions removed Wigs or hairpieces removed NA Smoking or tobacco materials removed NA Books / newspapers / magazines / loose paper removed Cologne, aftershave, perfume and deodorant removed Jewelry removed (may wrap wedding band) Make-up removed NA Hair care products removed Battery operated devices (external) removed Heating patches and chemical warmers removed Titanium eyewear removed NA Nail polish cured greater than 10 hours NA Casting material cured greater than 10 hours NA Hearing aids removed NA Loose dentures or partials removed NA Prosthetics have been removed NA Patient demonstrates correct use of air break device (if applicable) Patient concerns have been addressed Patient grounding bracelet on and cord attached to chamber Specifics for Inpatients (complete in addition to above) Medication sheet sent with patient NA Intravenous medications needed or due during therapy sent with patient NA Drainage tubes (e.g. nasogastric tube or chest tube secured and vented) NA Endotracheal or Tracheotomy tube secured NA Cuff deflated of air and inflated with saline NA Airway suctioned NA Notes Paper version used prior to treatment start. Electronic Signature(s) Signed: 03/09/2023 1:40:18 PM By: Haywood Pao CHT EMT BS , , Entered By: Haywood Pao on 03/09/2023 10:40:18

## 2023-03-10 NOTE — Progress Notes (Signed)
RAMONTE, FECTEAU ([id number]) [id number] Page 1 of 1 Visit Report for 03/08/2023 SuperBill Details Patient Name: Date of Service: STRAN, BECTON 03/08/2023 Medical Record Number: [id number] Patient Account Number: [id number] Date of Birth/Sex: Treating RN: 1936-02-13 (87 y.o. Tammy Sours Primary Care Provider: Renford Dills Other Clinician: Haywood Pao Referring Provider: Treating Provider/Extender: Layla Maw in Treatment: 4 Diagnosis Coding ICD-10 Codes Code Description N30.41 Irradiation cystitis with hematuria C61 Malignant neoplasm of prostate E11.9 Type 2 diabetes mellitus without complications Facility Procedures CPT4 Code Description Modifier Quantity [id number] [phone] - WOUND CARE VISIT-LEV 1 EST PT 1 Electronic Signature(s) Signed: 03/08/2023 4:15:30 PM By: Haywood Pao CHT EMT BS , , Signed: 03/09/2023 10:26:47 AM By: Duanne Guess MD FACS Entered By: Haywood Pao on 03/08/2023 13:15:30

## 2023-03-12 ENCOUNTER — Encounter (HOSPITAL_BASED_OUTPATIENT_CLINIC_OR_DEPARTMENT_OTHER): Payer: Medicare Other | Admitting: General Surgery

## 2023-03-12 DIAGNOSIS — C61 Malignant neoplasm of prostate: Secondary | ICD-10-CM | POA: Diagnosis not present

## 2023-03-12 DIAGNOSIS — N3041 Irradiation cystitis with hematuria: Secondary | ICD-10-CM | POA: Diagnosis not present

## 2023-03-12 DIAGNOSIS — E119 Type 2 diabetes mellitus without complications: Secondary | ICD-10-CM | POA: Diagnosis not present

## 2023-03-12 LAB — GLUCOSE, CAPILLARY
Glucose-Capillary: 171 mg/dL — ABNORMAL HIGH (ref 70–99)
Glucose-Capillary: 102 mg/dL — ABNORMAL HIGH (ref 70–99)
Glucose-Capillary: 104 mg/dL — ABNORMAL HIGH (ref 70–99)
Glucose-Capillary: 108 mg/dL — ABNORMAL HIGH (ref 70–99)
Glucose-Capillary: 110 mg/dL — ABNORMAL HIGH (ref 70–99)

## 2023-03-12 NOTE — Progress Notes (Signed)
[name], [name] ([id number]) [id number] Page 1 of 1 Visit Report for 03/09/2023 SuperBill Details Patient Name: Date of Service: [name], [name] 03/09/2023 Medical Record Number: [id number] Patient Account Number: [id number] Date of Birth/Sex: Treating RN: Mar 02, 1936 (87 y.o. [name] Primary Care Provider: Renford Dills Other Clinician: Haywood Pao Referring Provider: Treating Provider/Extender: Layla Maw in Treatment: 4 Diagnosis Coding ICD-10 Codes Code Description N30.41 Irradiation cystitis with hematuria C61 Malignant neoplasm of prostate E11.9 Type 2 diabetes mellitus without complications Facility Procedures CPT4 Code Description Modifier Quantity [id number] G0277-(Facility Use Only) HBOT full body chamber, , 4 ICD-10 Diagnosis Description N30.41 Irradiation cystitis with hematuria C61 Malignant neoplasm of prostate E11.9 Type 2 diabetes mellitus without complications Physician Procedures Quantity CPT4 Code Description Modifier 2440102 99183 - WC PHYS HYPERBARIC OXYGEN THERAPY 1 ICD-10 Diagnosis Description N30.41 Irradiation cystitis with hematuria C61 Malignant neoplasm of prostate E11.9 Type 2 diabetes mellitus without complications Electronic Signature(s) Signed: 03/09/2023 1:51:53 PM By: Haywood Pao CHT EMT BS , , Signed: 03/12/2023 7:47:30 AM By: Duanne Guess MD FACS Entered By: Haywood Pao on 03/09/2023 10:51:52

## 2023-03-15 ENCOUNTER — Encounter (HOSPITAL_BASED_OUTPATIENT_CLINIC_OR_DEPARTMENT_OTHER): Payer: Medicare Other | Admitting: General Surgery

## 2023-03-15 DIAGNOSIS — L598 Other specified disorders of the skin and subcutaneous tissue related to radiation: Secondary | ICD-10-CM | POA: Diagnosis not present

## 2023-03-15 DIAGNOSIS — N3041 Irradiation cystitis with hematuria: Secondary | ICD-10-CM | POA: Diagnosis not present

## 2023-03-15 DIAGNOSIS — E119 Type 2 diabetes mellitus without complications: Secondary | ICD-10-CM | POA: Diagnosis not present

## 2023-03-15 DIAGNOSIS — C61 Malignant neoplasm of prostate: Secondary | ICD-10-CM | POA: Diagnosis not present

## 2023-03-15 LAB — GLUCOSE, CAPILLARY: Glucose-Capillary: 205 mg/dL — ABNORMAL HIGH (ref 70–99)

## 2023-03-16 ENCOUNTER — Encounter (HOSPITAL_BASED_OUTPATIENT_CLINIC_OR_DEPARTMENT_OTHER): Payer: Medicare Other | Admitting: General Surgery

## 2023-03-16 DIAGNOSIS — E119 Type 2 diabetes mellitus without complications: Secondary | ICD-10-CM | POA: Diagnosis not present

## 2023-03-16 DIAGNOSIS — C61 Malignant neoplasm of prostate: Secondary | ICD-10-CM | POA: Diagnosis not present

## 2023-03-16 DIAGNOSIS — N3041 Irradiation cystitis with hematuria: Secondary | ICD-10-CM | POA: Diagnosis not present

## 2023-03-16 DIAGNOSIS — L598 Other specified disorders of the skin and subcutaneous tissue related to radiation: Secondary | ICD-10-CM | POA: Diagnosis not present

## 2023-03-16 LAB — GLUCOSE, CAPILLARY
Glucose-Capillary: 260 mg/dL — ABNORMAL HIGH (ref 70–99)
Glucose-Capillary: 188 mg/dL — ABNORMAL HIGH (ref 70–99)
Glucose-Capillary: 170 mg/dL — ABNORMAL HIGH (ref 70–99)

## 2023-03-16 NOTE — Progress Notes (Addendum)
[name], [name] ([id number]) [id number] Page 1 of 2 Visit Report for 03/15/2023 Arrival Information Details Patient Name: Date of Service: [name], [name] 03/15/2023 12:30 PM Medical Record Number: [id number] Patient Account Number: [id number] Date of Birth/Sex: Treating RN: 1935-09-22 (87 y.o. [name], Millard.Loa Primary Care Israel Werts: Renford Dills Other Clinician: Haywood Pao Referring Keshon Markovitz: Treating Sander Speckman/Extender: Layla Maw in Treatment: 5 Visit Information History Since Last Visit All ordered tests and consults were completed: Yes Patient Arrived: [name] Added or deleted any medications: No Arrival Time: 12:34 Any new allergies or adverse reactions: No Accompanied By: spouse Had a fall or experienced change in No Transfer Assistance: None activities of daily living that may affect Patient Identification Verified: Yes risk of falls: Secondary Verification Process Completed: Yes Signs or symptoms of abuse/neglect since last visito No Patient Requires Transmission-Based Precautions: No Hospitalized since last visit: No Patient Has Alerts: No Implantable device outside of the clinic excluding No cellular tissue based products placed in the center since last visit: Pain Present Now: No Electronic Signature(s) Signed: 03/16/2023 8:33:32 AM By: Haywood Pao CHT EMT BS , , Entered By: Haywood Pao on 03/16/2023 05:33:31 -------------------------------------------------------------------------------- Encounter Discharge Information Details Patient Name: Date of Service: [name], [name] 03/15/2023 12:30 PM Medical Record Number: [id number] Patient Account Number: [id number] Date of Birth/Sex: Treating RN: [birth year] (87 y.o. [name] Primary Care Dehlia Kilner: Renford Dills Other Clinician: Haywood Pao Referring Javana Schey: Treating Shritha Bresee/Extender: Layla Maw in Treatment: 5 Encounter Discharge Information Items Discharge Condition: Stable Ambulatory Status: Walker Discharge Destination: Home Transportation: Private Auto Accompanied By: spouse Schedule Follow-up Appointment: No Clinical Summary of Care: Electronic Signature(s) Signed: 03/16/2023 11:12:47 AM By: Haywood Pao CHT EMT BS , , Previous Signature: 03/16/2023 9:11:05 AM Version By: Haywood Pao CHT EMT BS , , Entered By: Haywood Pao on 03/16/2023 08:12:46 [name] ([id number]) [id number] Page 2 of 2 -------------------------------------------------------------------------------- Vitals Details Patient Name: Date of Service: [name], [name] 03/15/2023 12:30 PM Medical Record Number: [id number] Patient Account Number: [id number] Date of Birth/Sex: Treating RN: 1936/01/17 (87 y.o. [name] Primary Care Alton Tremblay: Renford Dills Other Clinician: Haywood Pao Referring Marck Mcclenny: Treating Zyriah Mask/Extender: Layla Maw in Treatment: 5 Vital Signs Time Taken: 12:37 Temperature (F): 98.0 Height (in): 66 Pulse (bpm): 54 Weight (lbs): 150 Respiratory Rate (breaths/min): 18 Body Mass Index (BMI): 24.2 Blood Pressure (mmHg): 148/61 Capillary Blood Glucose (mg/dl): 678 Reference Range: 80 - 120 mg / dl Electronic Signature(s) Signed: 03/16/2023 8:49:11 AM By: Haywood Pao CHT EMT BS , , Entered By: Haywood Pao on 03/16/2023 05:49:11

## 2023-03-16 NOTE — Progress Notes (Addendum)
JIAN, SANTALUCIA ([id number]) [id number] Page 1 of 2 Visit Report for 03/15/2023 HBO Details Patient Name: Date of Service: [name], [name] 03/15/2023 12:30 PM Medical Record Number: [id number] Patient Account Number: [id number] Date of Birth/Sex: Treating RN: May 23, 1935 (87 y.o. 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 HBO Treatment Details Treatment Number: 10 Patient Type: Outpatient Chamber Type: Monoplace Chamber Serial #: [id number] Treatment Protocol: 2.0 ATA with 90 minutes oxygen, and no air breaks Treatment Details Compression Rate Down: 1.0 psi / minute De-Compression Rate Up: 2.0 psi / minute Air breaks and breathing Decompress Decompress Compress Tx Pressure Begins Reached periods Begins Ends (leave unused spaces blank) Chamber Pressure (ATA 1 2 ------2 1 ) Clock Time (24 hr) 13:11 13:23 - - - - - - 14:53 15:04 Treatment Length: 113 (minutes) Treatment Segments: 4 Vital Signs Capillary Blood Glucose Reference Range: 80 - 120 mg / dl HBO Diabetic Blood Glucose Intervention Range: <131 mg/dl or >308 mg/dl Type: Time Vitals Blood Pulse: Respiratory Temperature: Capillary Blood Glucose Pulse Action Taken: Pressure: Rate: Glucose (mg/dl): Meter #: Oximetry (%) Taken: Pre 12:37 148/61 54 18 98 260 asymptomatic for hyperglycemia Post 15:07 155/57 54 18 98 205 asymptomatic for hypotension, bradycardia Treatment Response Treatment Toleration: Well Treatment Completion Status: Treatment Completed without Adverse Event Treatment Notes Mr. Kazmer arrived with vital signs that  included heart rate of 54 bpm. He denied symptoms related to bradycardia. He stated that he ate a meal prior to arrival. Blood glucose level was 260 mg/dL. He denied any symptoms related to hyperglycemia. After performing a safety check, patient was placed in the chamber which was compressed with 100% oxygen at the rate of 1.5 psi/min after confirming normal ear equalization (gradual from slowest speed possible). He tolerated the treatment and subsequent decompression of the chamber at the rate of 2 psi/min. He denied any issues with ear equalization and/or pain associated with barotrauma. Post-treatment vital signs included heart rate of 54 bpm and diastolic BP of 57 mmHg. He denied any symptoms related to bradycardia and/or hypoglycemia. He was stable upon discharge with his wife. Physician HBO Attestation: I certify that I supervised this HBO treatment in accordance with Medicare guidelines. A trained emergency response team is readily available per Yes hospital policies and procedures. Continue HBOT as ordered. Yes Electronic Signature(s) Signed: 03/16/2023 12:36:45 PM By: Duanne Guess MD FACS Previous Signature: 03/16/2023 8:58:37 AM Version By: Haywood Pao CHT EMT BS , , Previous Signature: 03/16/2023 12:24:20 PM Version By: Duanne Guess MD FACS [place], [name] ([id number]) [id number] Page 2 of 2 Previous Signature: 03/16/2023 12:24:20 PM Version By: Duanne Guess MD FACS Entered By: Duanne Guess on 03/16/2023 09:36:28 -------------------------------------------------------------------------------- HBO Safety Checklist Details Patient Name: Date of Service: [facility], Jakarri S. 03/15/2023 12:30 PM Medical Record Number: [id number] Patient Account Number: [id number] Date of Birth/Sex: Treating RN: 19-Nov-1935 (87 y.o. Tammy Sours Primary Care Kerissa Coia: Renford Dills Other Clinician: Haywood Pao Referring Farren Landa: Treating  Aralyn Nowak/Extender: Layla Maw in Treatment: 5 HBO Safety Checklist Items Safety Checklist Consent Form Signed Patient voided / foley secured and emptied 1130 - Sandwich, Gingerale, Oatmeal for When did you last eato Breakfast Last dose of injectable or oral agent 0915 Humalog, Metformin Ostomy  pouch emptied and vented if applicable NA All implantable devices assessed, documented and approved Freestyle Libre 2 and3 on approved list Intravenous access site secured and place NA Valuables secured Linens and cotton and cotton/polyester blend (less than 51% polyester) Personal oil-based products / skin lotions / body lotions removed Wigs or hairpieces removed NA Smoking or tobacco materials removed NA Books / newspapers / magazines / loose paper removed Cologne, aftershave, perfume and deodorant removed Jewelry removed (may wrap wedding band) Make-up removed NA Hair care products removed Battery operated devices (external) removed Heating patches and chemical warmers removed Titanium eyewear removed Nail polish cured greater than 10 hours NA Casting material cured greater than 10 hours NA Hearing aids removed NA Loose dentures or partials removed NA Prosthetics have been removed NA Patient demonstrates correct use of air break device (if applicable) Patient concerns have been addressed Patient grounding bracelet on and cord attached to chamber Specifics for Inpatients (complete in addition to above) Medication sheet sent with patient NA Intravenous medications needed or due during therapy sent with patient NA Drainage tubes (e.g. nasogastric tube or chest tube secured and vented) NA Endotracheal or Tracheotomy tube secured NA Cuff deflated of air and inflated with saline NA Airway suctioned NA Notes Paper version used prior to treatment start. Electronic Signature(s) Signed: 03/16/2023 8:50:36 AM By: Haywood Pao CHT EMT BS ,  , Entered By: Haywood Pao on 03/16/2023 05:50:36

## 2023-03-16 NOTE — Progress Notes (Signed)
[name] ([id number]) [id number] Page 1 of 2 Visit Report for 03/16/2023 Arrival Information Details Patient Name: Date of Service: [name], [name] 03/16/2023 9:30 A M Medical Record Number: [id number] Patient Account Number: [id number] Date of Birth/Sex: Treating RN: November 01, 1935 (87 y.o. [name], Bonita Quin Primary Care Tavoris Brisk: Renford Dills Other Clinician: Haywood Pao Referring Mila Pair: Treating Misha Antonini/Extender: Layla Maw in Treatment: 5 Visit Information History Since Last Visit All ordered tests and consults were completed: Yes Patient Arrived: [name] Added or deleted any medications: No Arrival Time: 09:26 Any new allergies or adverse reactions: No Accompanied By: self Had a fall or experienced change in No Transfer Assistance: None activities of daily living that may affect Patient Identification Verified: Yes risk of falls: Secondary Verification Process Completed: Yes Signs or symptoms of abuse/neglect since last visito No Patient Requires Transmission-Based Precautions: No Hospitalized since last visit: No Patient Has Alerts: No Implantable device outside of the clinic excluding No cellular tissue based products placed in the center since last visit: Pain Present Now: No Electronic Signature(s) Signed: 03/16/2023 12:32:56 PM By: Haywood Pao CHT EMT BS , , Entered By: Haywood Pao on 03/16/2023 09:32:56 -------------------------------------------------------------------------------- Encounter Discharge Information Details Patient Name: Date of Service: [name], [name] 03/16/2023 9:30 A M Medical Record Number: [id number] Patient Account Number: [id number] Date of Birth/Sex: Treating RN: 04-Jan-1936 (87 y.o. [name] Primary Care Allison Deshotels: Renford Dills Other Clinician: Haywood Pao Referring Lawana Hartzell: Treating Keiyana Stehr/Extender: Layla Maw in Treatment: 5 Encounter Discharge Information Items Discharge Condition: Stable Ambulatory Status: Ambulatory Discharge Destination: Home Transportation: Private Auto Accompanied By: spouse Schedule Follow-up Appointment: No Clinical Summary of Care: Electronic Signature(s) Signed: 03/16/2023 12:42:46 PM By: Haywood Pao CHT EMT BS , , Entered By: Haywood Pao on 03/16/2023 09:42:46 Clawson, [name] ([id number]) [id number] Page 2 of 2 -------------------------------------------------------------------------------- Vitals Details Patient Name: Date of Service: [name] 03/16/2023 9:30 A M Medical Record Number: [id number] Patient Account Number: [id number] Date of Birth/Sex: Treating RN: 1935-09-30 (87 y.o. [name] Primary Care Erling Arrazola: Renford Dills Other Clinician: Haywood Pao Referring Madellyn Denio: Treating Carlicia Leavens/Extender: Layla Maw in Treatment: 5 Vital Signs Time Taken: 09:50 Temperature (F): 98.0 Height (in): 66 Pulse (bpm): 60 Weight (lbs): 150 Respiratory Rate (breaths/min): 18 Body Mass Index (BMI): 24.2 Blood Pressure (mmHg): 134/55 Capillary Blood Glucose (mg/dl): 010 Reference Range: 80 - 120 mg / dl Electronic Signature(s) Signed: 03/16/2023 12:33:26 PM By: Haywood Pao CHT EMT BS , , Entered By: Haywood Pao on 03/16/2023 09:33:26

## 2023-03-16 NOTE — Progress Notes (Signed)
[name], [name] ([id number]) [id number] Page 1 of 1 Visit Report for 03/15/2023 SuperBill Details Patient Name: Date of Service: [name], [name] 03/15/2023 Medical Record Number: [id number] Patient Account Number: [id number] Date of Birth/Sex: Treating RN: [birth year] (87 y.o. Tammy Sours Primary Care Provider: Renford Dills Other Clinician: Haywood Pao Referring Provider: Treating Provider/Extender: Layla Maw in Treatment: 5 Diagnosis Coding ICD-10 Codes Code Description N30.41 Irradiation cystitis with hematuria C61 Malignant neoplasm of prostate E11.9 Type 2 diabetes mellitus without complications Facility Procedures CPT4 Code Description Modifier Quantity [id number] G0277-(Facility Use Only) HBOT full body chamber, , 4 ICD-10 Diagnosis Description N30.41 Irradiation cystitis with hematuria C61 Malignant neoplasm of prostate E11.9 Type 2 diabetes mellitus without complications Physician Procedures Quantity CPT4 Code Description Modifier [id number] - WC PHYS HYPERBARIC OXYGEN THERAPY 1 ICD-10 Diagnosis Description N30.41 Irradiation cystitis with hematuria C61 Malignant neoplasm of prostate E11.9 Type 2 diabetes mellitus without complications Electronic Signature(s) Signed: 03/16/2023 9:10:44 AM By: Haywood Pao CHT EMT BS , , Signed: 03/16/2023 12:24:20 PM By: Duanne Guess MD FACS Previous Signature: 03/16/2023 8:58:55 AM Version By: Haywood Pao CHT EMT BS , , Entered By: Haywood Pao on 03/16/2023 06:10:44

## 2023-03-16 NOTE — Progress Notes (Signed)
[name] ([id number]) [id number] Page 1 of 2 Visit Report for 03/16/2023 HBO Details Patient Name: Date of Service: [name], [name] 03/16/2023 9:30 A M Medical Record Number: [id number] Patient Account Number: [id number] Date of Birth/Sex: Treating RN: 1935-12-14 (87 y.o. 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 HBO Treatment Details Treatment Number: 11 Patient Type: Outpatient Chamber Type: Monoplace Chamber Serial #: [id number] Treatment Protocol: 2.0 ATA with 90 minutes oxygen, and no air breaks Treatment Details Compression Rate Down: 1.0 psi / minute De-Compression Rate Up: 1.5 psi / minute Air breaks and breathing Decompress Decompress Compress Tx Pressure Begins Reached periods Begins Ends (leave unused spaces blank) Chamber Pressure (ATA 1 2 ------2 1 ) Clock Time (24 hr) 10:00 10:16 - - - - - - 10:46 11:57 Treatment Length: 117 (minutes) Treatment Segments: 4 Vital Signs Capillary Blood Glucose Reference Range: 80 - 120 mg / dl HBO Diabetic Blood Glucose Intervention Range: <131 mg/dl or >295 mg/dl Type: Time Vitals Blood Respiratory Capillary Blood Glucose Pulse Action Pulse: Temperature: Taken: Pressure: Rate: Glucose (mg/dl): Meter #: Oximetry (%) Taken: Pre 09:50 134/55 60 18 98 188 none per protocol Post 12:02 157/62 55 18 97.9 170 none per protocol Treatment Response Treatment Toleration: Well Treatment Completion Status: Treatment Completed without Adverse Event Treatment Notes [name] arrived with vital signs that included diastolic BP of 55  mmHg. He denied symptoms related to hypotension. He stated that he ate a meal prior to arrival. Blood glucose level was 188 mg/dL. After performing a safety check, patient was placed in the chamber which was compressed with 100% oxygen at the rate of 1.5 psi/min after confirming normal ear equalization (gradual from slowest speed possible). He tolerated the treatment and subsequent decompression of the chamber at the rate of 1.5 psi/min. He denied any issues with ear equalization and/or pain associated with barotrauma. Post-treatment vital signs included heart rate of 55 bpm. He denied any symptoms related to hypotension. Other vital signs were within normal range. He was stable upon discharge with his wife. Electronic Signature(s) Signed: 03/16/2023 12:39:31 PM By: [name] CHT EMT BS , , Previous Signature: 03/16/2023 12:36:45 PM Version By: Duanne Guess MD FACS Entered By: [name] on 03/16/2023 09:39:31 Bartelt, [name] ([id number]) [id number] Page 2 of 2 -------------------------------------------------------------------------------- HBO Safety Checklist Details Patient Name: Date of Service: [name], [name] 03/16/2023 9:30 A M Medical Record Number: [id number] Patient Account Number: [id number] Date of Birth/Sex: Treating RN: 10-Oct-1935 (87 y.o. [name] Primary Care Alle Difabio: [name] Other Clinician: Haywood [name] Referring Devine Klingel: Treating Yianni Skilling/Extender: [name] in Treatment: 5 HBO Safety Checklist Items Safety Checklist Consent Form Signed Patient voided / foley secured and emptied Foley When did you last eato 0830 Chicken salad sandwich on croissant Last dose of injectable or oral agent 0900 Humalog and Metformin Ostomy pouch emptied and vented if applicable NA All implantable devices assessed, documented and approved Freestyle Libre 2 and3 on approved list Intravenous  access site secured and place NA Valuables secured Linens and cotton and cotton/polyester blend (less than 51% polyester) Personal oil-based products / skin lotions / body lotions removed Wigs or hairpieces removed NA Smoking or tobacco materials removed NA Books /  newspapers / magazines / loose paper removed Cologne, aftershave, perfume and deodorant removed Jewelry removed (may wrap wedding band) Make-up removed NA Hair care products removed Battery operated devices (external) removed Heating patches and chemical warmers removed Titanium eyewear removed Nail polish cured greater than 10 hours NA Casting material cured greater than 10 hours NA Hearing aids removed NA Loose dentures or partials removed NA Prosthetics have been removed NA Patient demonstrates correct use of air break device (if applicable) Patient concerns have been addressed Patient grounding bracelet on and cord attached to chamber Specifics for Inpatients (complete in addition to above) Medication sheet sent with patient NA Intravenous medications needed or due during therapy sent with patient NA Drainage tubes (e.g. nasogastric tube or chest tube secured and vented) NA Endotracheal or Tracheotomy tube secured NA Cuff deflated of air and inflated with saline NA Airway suctioned NA Notes Paper version used prior to treatment start. Electronic Signature(s) Signed: 03/16/2023 12:35:08 PM By: [name] CHT EMT BS , , Entered By: [name] on 03/16/2023 09:35:07

## 2023-03-19 ENCOUNTER — Encounter (HOSPITAL_BASED_OUTPATIENT_CLINIC_OR_DEPARTMENT_OTHER): Payer: Medicare Other | Admitting: General Surgery

## 2023-03-19 DIAGNOSIS — L598 Other specified disorders of the skin and subcutaneous tissue related to radiation: Secondary | ICD-10-CM | POA: Diagnosis not present

## 2023-03-19 DIAGNOSIS — N3041 Irradiation cystitis with hematuria: Secondary | ICD-10-CM | POA: Diagnosis not present

## 2023-03-19 DIAGNOSIS — E119 Type 2 diabetes mellitus without complications: Secondary | ICD-10-CM | POA: Diagnosis not present

## 2023-03-19 DIAGNOSIS — C61 Malignant neoplasm of prostate: Secondary | ICD-10-CM | POA: Diagnosis not present

## 2023-03-19 LAB — GLUCOSE, CAPILLARY
Glucose-Capillary: 160 mg/dL — ABNORMAL HIGH (ref 70–99)
Glucose-Capillary: 146 mg/dL — ABNORMAL HIGH (ref 70–99)

## 2023-03-19 NOTE — Progress Notes (Signed)
RYLON, JANEY ([id number]) [id number] Page 1 of 2 Visit Report for 03/19/2023 Arrival Information Details Patient Name: Date of Service: [name], [name] 03/19/2023 12:30 PM Medical Record Number: [id number] Patient Account Number: [id number] Date of Birth/Sex: Treating RN: [birth year] (88 y.o. [name] Primary Care Theotis Gerdeman: Renford Dills Other Clinician: Haywood Pao Referring Brinlee Gambrell: Treating Valgene Deloatch/Extender: Layla Maw in Treatment: 5 Visit Information History Since Last Visit All ordered tests and consults were completed: Yes Patient Arrived: [name] Added or deleted any medications: No Arrival Time: 12:26 Any new allergies or adverse reactions: No Accompanied By: self Had a fall or experienced change in No Transfer Assistance: None activities of daily living that may affect Patient Identification Verified: Yes risk of falls: Secondary Verification Process Completed: Yes Signs or symptoms of abuse/neglect since last visito No Patient Requires Transmission-Based Precautions: No Hospitalized since last visit: No Patient Has Alerts: No Implantable device outside of the clinic excluding No cellular tissue based products placed in the center since last visit: Pain Present Now: No Electronic Signature(s) Signed: 03/19/2023 4:09:18 PM By: Haywood Pao CHT EMT BS , , Previous Signature: 03/19/2023 4:04:39 PM Version By: Haywood Pao CHT EMT BS , , Previous Signature: 03/19/2023 4:03:29 PM Version By: Haywood Pao CHT EMT BS , , Entered By: Haywood Pao on 03/19/2023 13:09:17 -------------------------------------------------------------------------------- Encounter Discharge Information Details Patient Name: Date of Service: [name], [name] 03/19/2023 12:30 PM Medical Record Number: [id number] Patient Account Number: [id number] Date of Birth/Sex: Treating RN: 1935/04/08 (87  y.o. [name] Primary Care Carless Slatten: Renford Dills Other Clinician: Haywood Pao Referring Saphronia Ozdemir: Treating Vannie Hilgert/Extender: Layla Maw in Treatment: 5 Encounter Discharge Information Items Discharge Condition: Stable Ambulatory Status: Walker Discharge Destination: Home Transportation: Private Auto Accompanied By: spouse Schedule Follow-up Appointment: No Clinical Summary of Care: Electronic Signature(s) Signed: 03/19/2023 4:09:04 PM By: Haywood Pao CHT EMT BS , , Entered By: Haywood Pao on 03/19/2023 13:09:04 [name] ([id number]) [id number] Page 2 of 2 -------------------------------------------------------------------------------- Vitals Details Patient Name: Date of Service: [name], [name] 03/19/2023 12:30 PM Medical Record Number: [id number] Patient Account Number: [id number] Date of Birth/Sex: Treating RN: Jun 01, 1935 (87 y.o. [name] Primary Care Donalyn Schneeberger: Renford Dills Other Clinician: Haywood Pao Referring Jocelynne Duquette: Treating Cherolyn Behrle/Extender: Layla Maw in Treatment: 5 Vital Signs Time Taken: 12:29 Temperature (F): 97.8 Height (in): 66 Pulse (bpm): 63 Weight (lbs): 150 Respiratory Rate (breaths/min): 18 Body Mass Index (BMI): 24.2 Blood Pressure (mmHg): 157/58 Capillary Blood Glucose (mg/dl): 166 Reference Range: 80 - 120 mg / dl Electronic Signature(s) Signed: 03/19/2023 4:04:23 PM By: Haywood Pao CHT EMT BS , , Entered By: Haywood Pao on 03/19/2023 13:04:23

## 2023-03-19 NOTE — Progress Notes (Signed)
DAWES, UMANA ([id number]) [id number] Page 1 of 2 Visit Report for 02/22/2023 Arrival Information Details Patient Name: Date of Service: [name], [name] 02/22/2023 12:30 PM Medical Record Number: [id number] Patient Account Number: [id number] Date of Birth/Sex: Treating RN: 1935-07-27 (87 y.o. [name] Primary Care Benjimen Kelley: Renford Dills Other Clinician: Daryll Brod Referring Aashika Carta: Treating Yair Dusza/Extender: Gwyneth Revels in Treatment: 2 Visit Information History Since Last Visit Added or deleted any medications: No Patient Arrived: Ambulatory Any new allergies or adverse reactions: No Arrival Time: 12:22 Had a fall or experienced change in No Accompanied By: self activities of daily living that may affect Transfer Assistance: None risk of falls: Patient Identification Verified: Yes Signs or symptoms of abuse/neglect since last visito No Secondary Verification Process Completed: Yes Hospitalized since last visit: No Patient Requires Transmission-Based Precautions: No Implantable device outside of the clinic excluding No Patient Has Alerts: No cellular tissue based products placed in the center since last visit: Pain Present Now: No Electronic Signature(s) Signed: 03/19/2023 3:37:49 PM By: Demetria Pore Entered By: Demetria Pore on 02/22/2023 12:15:06 -------------------------------------------------------------------------------- Encounter Discharge Information Details Patient Name: Date of Service: [name], [name] 02/22/2023 12:30 PM Medical Record Number: [id number] Patient Account Number: [id number] Date of Birth/Sex: Treating RN: [birth year] (87 y.o. [name] Primary Care Reuel Lamadrid: Renford Dills Other Clinician: Daryll Brod Referring Tyria Springer: Treating Norris Bodley/Extender: Gwyneth Revels in Treatment: 2 Encounter Discharge Information Items Discharge  Condition: Stable Ambulatory Status: Ambulatory Discharge Destination: Home Transportation: Private Auto Accompanied By: wife Schedule Follow-up Appointment: No Clinical Summary of Care: Electronic Signature(s) Signed: 03/19/2023 3:37:49 PM By: Demetria Pore Entered By: Demetria Pore on 02/22/2023 12:17:37 [name] ([id number]) [id number] Page 2 of 2 -------------------------------------------------------------------------------- Vitals Details Patient Name: Date of Service: [name], [name] 02/22/2023 12:30 PM Medical Record Number: [id number] Patient Account Number: [id number] Date of Birth/Sex: Treating RN: November 01, 1935 (87 y.o. [name] Primary Care Anyiah Coverdale: Renford Dills Other Clinician: Daryll Brod Referring Grason Brailsford: Treating Lynden Flemmer/Extender: Gwyneth Revels in Treatment: 2 Vital Signs Time Taken: 12:40 Temperature (F): 97.7 Height (in): 66 Pulse (bpm): 56 Weight (lbs): 150 Respiratory Rate (breaths/min): 18 Body Mass Index (BMI): 24.2 Blood Pressure (mmHg): 152/61 Capillary Blood Glucose (mg/dl): 376 Reference Range: 80 - 120 mg / dl Electronic Signature(s) Signed: 03/19/2023 3:37:49 PM By: Demetria Pore Entered By: Demetria Pore on 02/22/2023 12:15:10

## 2023-03-19 NOTE — Progress Notes (Signed)
[name], [name] ([id number]) [id number] Page 1 of 1 Visit Report for 03/16/2023 SuperBill Details Patient Name: Date of Service: [name], [name] 03/16/2023 Medical Record Number: [id number] Patient Account Number: [id number] Date of Birth/Sex: Treating RN: 1935-07-23 (87 y.o. [name] Primary Care Provider: Renford Dills Other Clinician: Haywood Pao Referring Provider: Treating Provider/Extender: Layla Maw in Treatment: 5 Diagnosis Coding ICD-10 Codes Code Description N30.41 Irradiation cystitis with hematuria C61 Malignant neoplasm of prostate E11.9 Type 2 diabetes mellitus without complications Facility Procedures CPT4 Code Description Modifier Quantity [id number] G0277-(Facility Use Only) HBOT full body chamber, , 4 ICD-10 Diagnosis Description N30.41 Irradiation cystitis with hematuria C61 Malignant neoplasm of prostate E11.9 Type 2 diabetes mellitus without complications Physician Procedures Quantity CPT4 Code Description Modifier [id number] - WC PHYS HYPERBARIC OXYGEN THERAPY 1 ICD-10 Diagnosis Description N30.41 Irradiation cystitis with hematuria C61 Malignant neoplasm of prostate E11.9 Type 2 diabetes mellitus without complications Electronic Signature(s) Signed: 03/16/2023 12:42:16 PM By: Haywood Pao CHT EMT BS , , Signed: 03/19/2023 8:06:12 AM By: Duanne Guess MD FACS Entered By: Haywood Pao on 03/16/2023 12:42:16

## 2023-03-19 NOTE — Progress Notes (Signed)
JHAIR, FEICK ([id number]) [id number] Page 1 of 1 Visit Report for 02/22/2023 SuperBill Details Patient Name: Date of Service: [name], [name] 02/22/2023 Medical Record Number: [id number] Patient Account Number: [id number] Date of Birth/Sex: Treating RN: [birth year] (87 y.o. [name] Primary Care Provider: Renford Dills Other Clinician: Daryll Brod Referring Provider: Treating Provider/Extender: Gwyneth Revels in Treatment: 2 Diagnosis Coding ICD-10 Codes Code Description N30.41 Irradiation cystitis with hematuria C61 Malignant neoplasm of prostate E11.9 Type 2 diabetes mellitus without complications Facility Procedures CPT4 Code Description Modifier Quantity 08657846 G0277-(Facility Use Only) HBOT full body chamber, , 4 Physician Procedures Quantity CPT4 Code Description Modifier [id number] - WC PHYS HYPERBARIC OXYGEN THERAPY 1 ICD-10 Diagnosis Description N30.41 Irradiation cystitis with hematuria C61 Malignant neoplasm of prostate Electronic Signature(s) Signed: 02/22/2023 5:41:20 PM By: Baltazar Najjar MD Signed: 03/19/2023 3:37:49 PM By: Demetria Pore Entered By: Demetria Pore on 02/22/2023 12:16:13

## 2023-03-19 NOTE — Progress Notes (Signed)
AMARIAN, TIBOR ([id number]) [id number] Page 1 of 4 Visit Report for 03/12/2023 Arrival Information Details Patient Name: Date of Service: [name] [name] 03/12/2023 2:00 PM Medical Record Number: [id number] Patient Account Number: [id number] Date of Birth/Sex: Treating RN: 07-21-1935 (87 y.o. M) Primary Care Sadee Osland: Renford Dills Other Clinician: Referring Biff Rutigliano: Treating Karel Turpen/Extender: Layla Maw in Treatment: 4 Visit Information History Since Last Visit Added or deleted any medications: No Patient Arrived: [name] Any new allergies or adverse reactions: No Arrival Time: 02:00 Had a fall or experienced change in No Accompanied By: self activities of daily living that may affect Transfer Assistance: None risk of falls: Patient Identification Verified: Yes Signs or symptoms of abuse/neglect since last visito No Secondary Verification Process Completed: Yes Hospitalized since last visit: No Patient Requires Transmission-Based Precautions: No Implantable device outside of the clinic excluding No Patient Has Alerts: No cellular tissue based products placed in the center since last visit: Pain Present Now: No Notes Glucose taken at 2:04 results 102. Iyania Denne notified and retaken at 2:41 results 110. Patient requested to take again 108. Waited 5 minutes per patient request 104 Teresea Donley aware of patient request. Patient did not have HBO today Electronic Signature([name]) Signed: 03/19/2023 3:37:49 PM By: Demetria Pore Entered By: Demetria Pore on 03/19/2023 12:27:29 -------------------------------------------------------------------------------- Clinic Level of Care Assessment Details Patient Name: Date of Service: [facility], [name]. 03/12/2023 2:00 PM Medical Record Number: [id number] Patient Account Number: [id number] Date of Birth/Sex: Treating RN: 1935-12-22 (87 y.o. M) Primary Care Jamilya Sarrazin: Renford Dills Other  Clinician: Referring Denaly Gatling: Treating Raahil Ong/Extender: Layla Maw in Treatment: 4 Clinic Level of Care Assessment Items TOOL 4 Quantity Score []  - 0 Use when only an EandM is performed on FOLLOW-UP visit ASSESSMENTS - Nursing Assessment / Reassessment []  - 0 Reassessment of Co-morbidities (includes updates in patient status) []  - 0 Reassessment of Adherence to Treatment Plan ASSESSMENTS - Wound and Skin A ssessment / Reassessment []  - 0 Simple Wound Assessment / Reassessment - one wound []  - 0 Complex Wound Assessment / Reassessment - multiple wounds []  - 0 Dermatologic / Skin Assessment (not related to wound area) ASSESSMENTS - Focused Assessment []  - 0 Circumferential Edema Measurements - multi extremities [name] [name] [name] [name] ([id number]) [id number] Page 2 of 4 []  - 0 Nutritional Assessment / Counseling / Intervention []  - 0 Lower Extremity Assessment (monofilament, tuning fork, pulses) []  - 0 Peripheral Arterial Disease Assessment (using hand held doppler) ASSESSMENTS - Ostomy and/or Continence Assessment and Care []  - 0 Incontinence Assessment and Management []  - 0 Ostomy Care Assessment and Management (repouching, etc.) PROCESS - Coordination of Care []  - 0 Simple Patient / Family Education for ongoing care []  - 0 Complex (extensive) Patient / Family Education for ongoing care X- 1 10 Staff obtains Chiropractor, Records, T Results / Process Orders est []  - 0 Staff telephones HHA, Nursing Homes / Clarify orders / etc []  - 0 Routine Transfer to another Facility (non-emergent condition) []  - 0 Routine Hospital Admission (non-emergent condition) []  - 0 New Admissions / Manufacturing engineer / Ordering NPWT Apligraf, etc. , []  - 0 Emergency Hospital Admission (emergent condition) []  - 0 Simple Discharge Coordination []  - 0 Complex (extensive) Discharge Coordination PROCESS - Special Needs []  -  0 Pediatric / Minor Patient Management []  - 0 Isolation Patient Management []  - 0 Hearing / Language / Visual special needs []  - 0 Assessment of Community assistance (transportation, D/C planning, etc.) []  - 0 Additional assistance /  Altered mentation []  - 0 Support Surface([name]) Assessment (bed, cushion, seat, etc.) INTERVENTIONS - Wound Cleansing / Measurement []  - 0 Simple Wound Cleansing - one wound []  - 0 Complex Wound Cleansing - multiple wounds []  - 0 Wound Imaging (photographs - any number of wounds) []  - 0 Wound Tracing (instead of photographs) []  - 0 Simple Wound Measurement - one wound []  - 0 Complex Wound Measurement - multiple wounds INTERVENTIONS - Wound Dressings []  - 0 Small Wound Dressing one or multiple wounds []  - 0 Medium Wound Dressing one or multiple wounds []  - 0 Large Wound Dressing one or multiple wounds []  - 0 Application of Medications - topical []  - 0 Application of Medications - injection INTERVENTIONS - Miscellaneous []  - 0 External ear exam []  - 0 Specimen Collection (cultures, biopsies, blood, body fluids, etc.) []  - 0 Specimen([name]) / Culture([name]) sent or taken to Lab for analysis []  - 0 Patient Transfer (multiple staff / Nurse, adult / Similar devices) []  - 0 Simple Staple / Suture removal (25 or less) []  - 0 Complex Staple / Suture removal (26 or more) [name] [name] [name] [name] ([id number]) [id number] Page 3 of 4 []  - 0 Hypo / Hyperglycemic Management (close monitor of Blood Glucose) []  - 0 Ankle / Brachial Index (ABI) - do not check if billed separately X- 1 5 Vital Signs Has the patient been seen at the hospital within the last three years: Yes Total Score: 15 Level Of Care: New/Established - Level 1 Electronic Signature([name]) Signed: 03/19/2023 3:37:49 PM By: Demetria Pore Entered By: Demetria Pore on 03/19/2023 12:27:12 -------------------------------------------------------------------------------- Encounter  Discharge Information Details Patient Name: Date of Service: [name] [name] [name] [name]. 03/12/2023 2:00 PM Medical Record Number: [id number] Patient Account Number: [id number] Date of Birth/Sex: Treating RN: 12-31-1935 (87 y.o. M) Primary Care Ashleymarie Granderson: Renford Dills Other Clinician: Referring Ukiah Trawick: Treating Shemika Robbs/Extender: Layla Maw in Treatment: 4 Encounter Discharge Information Items Discharge Condition: Stable Ambulatory Status: Ambulatory Discharge Destination: Home Transportation: Private Auto Accompanied By: self Schedule Follow-up Appointment: Yes Clinical Summary of Care: Electronic Signature([name]) Signed: 03/19/2023 3:37:49 PM By: Francoise Ceo By: Demetria Pore on 03/19/2023 12:28:37 -------------------------------------------------------------------------------- Patient/Caregiver Education Details Patient Name: Date of Service: [name] [name] 12/23/2024andnbsp2:00 PM Medical Record Number: [id number] Patient Account Number: [id number] Date of Birth/Gender: Treating RN: [birth year] (87 y.o. M) Primary Care Physician: Renford Dills Other Clinician: Referring Physician: Treating Physician/Extender: Layla Maw in Treatment: 4 Education Assessment Education Provided To: Patient Education Topics Provided Electronic Signature([name]) Signed: 03/19/2023 3:37:49 PM By: Francoise Ceo By: Demetria Pore on 03/19/2023 12:28:12 [name] ([id number]) [id number] Page 4 of 4 -------------------------------------------------------------------------------- Vitals Details Patient Name: Date of Service: [name] [name] [name] [name] 03/12/2023 2:00 PM Medical Record Number: [id number] Patient Account Number: [id number] Date of Birth/Sex: Treating RN: 04/16/1935 (87 y.o. M) Primary Care Sueo Cullen: Renford Dills Other Clinician: Referring Kathlynn Swofford: Treating Bracken Moffa/Extender: Layla Maw in Treatment: 4 Vital Signs Time Taken: 02:04 Temperature (F): 97.2 Height (in): 66 Pulse (bpm): 57 Weight (lbs): 150 Respiratory Rate (breaths/min): 18 Body Mass Index (BMI): 24.2 Blood Pressure (mmHg): 155/48 Capillary Blood Glucose (mg/dl): 062 Reference Range: 80 - 120 mg / dl Airway Pulse Oximetry (%): 99 Electronic Signature([name]) Signed: 03/19/2023 3:37:49 PM By: Demetria Pore Entered By: Demetria Pore on 03/19/2023 12:27:31

## 2023-03-19 NOTE — Progress Notes (Signed)
SARAN, SCHUENEMAN ([id number]) [id number] Page 1 of 1 Visit Report for 03/19/2023 SuperBill Details Patient Name: Date of Service: JONMICHAEL, BELTRAME 03/19/2023 Medical Record Number: [id number] Patient Account Number: [id number] Date of Birth/Sex: Treating RN: [birth year] (87 y.o. [name] Primary Care Provider: Renford Dills Other Clinician: Haywood Pao Referring Provider: Treating Provider/Extender: Layla Maw in Treatment: 5 Diagnosis Coding ICD-10 Codes Code Description N30.41 Irradiation cystitis with hematuria C61 Malignant neoplasm of prostate E11.9 Type 2 diabetes mellitus without complications Facility Procedures CPT4 Code Description Modifier Quantity [id number] G0277-(Facility Use Only) HBOT full body chamber, , 4 ICD-10 Diagnosis Description N30.41 Irradiation cystitis with hematuria C61 Malignant neoplasm of prostate E11.9 Type 2 diabetes mellitus without complications Physician Procedures Quantity CPT4 Code Description Modifier [id number] 99183 - WC PHYS HYPERBARIC OXYGEN THERAPY 1 ICD-10 Diagnosis Description N30.41 Irradiation cystitis with hematuria C61 Malignant neoplasm of prostate E11.9 Type 2 diabetes mellitus without complications Electronic Signature(s) Signed: 03/19/2023 4:08:43 PM By: Haywood Pao CHT EMT BS , , Signed: 03/19/2023 4:37:48 PM By: Duanne Guess MD FACS Entered By: Haywood Pao on 03/19/2023 13:08:43

## 2023-03-19 NOTE — Progress Notes (Addendum)
DULCE, WOJTAS ([id number]) [id number] Page 1 of 2 Visit Report for 03/19/2023 HBO Details Patient Name: Date of Service: [name], [name] 03/19/2023 12:30 PM Medical Record Number: [id number] Patient Account Number: [id number] Date of Birth/Sex: Treating RN: [birth year] (87 y.o. 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 HBO Treatment Details Treatment Number: 12 Patient Type: Outpatient Chamber Type: Monoplace Chamber Serial #: [id number] Treatment Protocol: 2.0 ATA with 90 minutes oxygen, and no air breaks Treatment Details Compression Rate Down: 1.0 psi / minute De-Compression Rate Up: 1.5 psi / minute Air breaks and breathing Decompress Decompress Compress Tx Pressure Begins Reached periods Begins Ends (leave unused spaces blank) Chamber Pressure (ATA 1 2 ------2 1 ) Clock Time (24 hr) 13:02 13:16 - - - - - - 14:46 14:56 Treatment Length: 114 (minutes) Treatment Segments: 4 Vital Signs Capillary Blood Glucose Reference Range: 80 - 120 mg / dl HBO Diabetic Blood Glucose Intervention Range: <131 mg/dl or >324 mg/dl Type: Time Vitals Blood Respiratory Capillary Blood Glucose Pulse Action Pulse: Temperature: Taken: Pressure: Rate: Glucose (mg/dl): Meter #: Oximetry (%) Taken: Pre 12:29 157/58 63 18 97.8 160 none per protocol Post 15:01 177/69 57 18 98.1 146 none per protocol Treatment Response Treatment Toleration: Well Treatment Completion Status: Treatment Completed without Adverse Event Treatment Notes Mr. Gruhlke arrived with vital signs that included diastolic BP of 58  mmHg. He denied symptoms related to hypotension. He stated that he ate a meal prior to arrival. Blood glucose level was 160 mg/dL. After performing a safety check, patient was placed in the chamber which was compressed with 100% oxygen at the rate of 1.5 psi/min after confirming normal ear equalization (gradual from slowest speed possible). He tolerated the treatment and subsequent decompression of the chamber at the rate of 1.5 psi/min. He denied any issues with ear equalization and/or pain associated with barotrauma. Post-treatment vital signs included heart rate of 57 bpm. He denied any symptoms related to bradycardia. Other vital signs were within normal range. He was stable upon discharge with his wife. Physician HBO Attestation: I certify that I supervised this HBO treatment in accordance with Medicare guidelines. A trained emergency response team is readily available per Yes hospital policies and procedures. Continue HBOT as ordered. Yes Electronic Signature(s) Signed: 03/19/2023 4:41:21 PM By: Duanne Guess MD FACS Previous Signature: 03/19/2023 4:08:13 PM Version By: Haywood Pao CHT EMT BS , , [place], [name] ([id number]) [id number] Page 2 of 2 Entered By: Duanne Guess on 03/19/2023 13:38:27 -------------------------------------------------------------------------------- HBO Safety Checklist Details Patient Name: Date of Service: [name], [name] 03/19/2023 12:30 PM Medical Record Number: [id number] Patient Account Number: [id number] Date of Birth/Sex: Treating RN: 11-02-1935 (87 y.o. [name] Primary Care Ula Couvillon: Renford Dills Other Clinician: Haywood Pao Referring Ronalee Scheunemann: Treating Adib Wahba/Extender: Layla Maw in Treatment: 5 HBO Safety Checklist Items Safety Checklist Consent Form Signed Patient voided / foley secured and emptied 1130 - Chicken Salad Sandwich, oatmeal for When did you  last eato breakfast Last dose of injectable or oral agent 0820 Humalog, 1030 Metformin Ostomy pouch emptied and vented if applicable NA All implantable devices assessed, documented and approved Freestyle Libre 2 and3 on approved list Intravenous access site secured and place NA Valuables  secured Linens and cotton and cotton/polyester blend (less than 51% polyester) Personal oil-based products / skin lotions / body lotions removed Wigs or hairpieces removed NA Smoking or tobacco materials removed NA Books / newspapers / magazines / loose paper removed Cologne, aftershave, perfume and deodorant removed Jewelry removed (may wrap wedding band) Make-up removed NA Hair care products removed Battery operated devices (external) removed Heating patches and chemical warmers removed Titanium eyewear removed Nail polish cured greater than 10 hours NA Casting material cured greater than 10 hours NA Hearing aids removed NA Loose dentures or partials removed NA Prosthetics have been removed NA Patient demonstrates correct use of air break device (if applicable) Patient concerns have been addressed Patient grounding bracelet on and cord attached to chamber Specifics for Inpatients (complete in addition to above) Medication sheet sent with patient NA Intravenous medications needed or due during therapy sent with patient NA Drainage tubes (e.g. nasogastric tube or chest tube secured and vented) NA Endotracheal or Tracheotomy tube secured NA Cuff deflated of air and inflated with saline NA Airway suctioned NA Notes Paper version used prior to treatment start. Electronic Signature(s) Signed: 03/19/2023 4:06:11 PM By: Haywood Pao CHT EMT BS , , Entered By: Haywood Pao on 03/19/2023 13:06:11

## 2023-03-19 NOTE — Progress Notes (Signed)
[name], [name] ([id number]) [id number] Page 1 of 2 Visit Report for 02/22/2023 HBO Details Patient Name: Date of Service: [name], [name] 02/22/2023 12:30 PM Medical Record Number: [id number] Patient Account Number: [id number] Date of Birth/Sex: Treating RN: [birth year] (87 y.o. 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 HBO Treatment Details Treatment Number: 2 Patient Type: Outpatient Chamber Type: Monoplace Chamber Serial #: [id number] Treatment Protocol: 2.0 ATA with 90 minutes oxygen, and no air breaks Treatment Details Compression Rate Down: 2.0 psi / minute De-Compression Rate Up: 1.0 psi / minute Air breaks and breathing Decompress Decompress Compress Tx Pressure Begins Reached periods Begins Ends (leave unused spaces blank) Chamber Pressure (ATA 1 2 ------2 1 ) Clock Time (24 hr) 12:57 1:15 - - - - - - 14:46 15:02 Treatment Length: 125 (minutes) Treatment Segments: 4 Vital Signs Capillary Blood Glucose Reference Range: 80 - 120 mg / dl HBO Diabetic Blood Glucose Intervention Range: <131 mg/dl or >629 mg/dl Time Vitals Blood Respiratory Capillary Blood Glucose Pulse Action Type: Pulse: Temperature: Taken: Pressure: Rate: Glucose (mg/dl): Meter #: Oximetry (%) Taken: Pre 12:40 152/61 56 18 97.7 159 Post 15:02 173/61 51 18 97.6 126 1 Treatment Response Treatment Toleration: Well Treatment Completion Status: Treatment Completed without Adverse Event Demitrus Francisco Notes No concern with treatment given Physician HBO Attestation: I certify that I supervised this HBO treatment in  accordance with Medicare guidelines. A trained emergency response team is readily available per Yes hospital policies and procedures. Continue HBOT as ordered. Yes Electronic Signature(s) Signed: 02/22/2023 5:41:20 PM By: Baltazar Najjar MD Entered By: Baltazar Najjar on 02/22/2023 14:38:57 [name] ([id number]) [id number] Page 2 of 2 -------------------------------------------------------------------------------- HBO Safety Checklist Details Patient Name: Date of Service: [name], [name] 02/22/2023 12:30 PM Medical Record Number: [id number] Patient Account Number: [id number] Date of Birth/Sex: Treating RN: Jan 05, 1936 (87 y.o. [name] Primary Care Aymen Widrig: Renford Dills Other Clinician: Daryll Brod Referring Del Overfelt: Treating Karlisha Mathena/Extender: Gwyneth Revels in Treatment: 2 HBO Safety Checklist Items Safety Checklist Consent Form Signed Patient voided / foley secured and emptied When did you last eato 11:30 spaghetti Last dose of injectable or oral agent 10:00 Ostomy pouch emptied and vented if applicable All implantable devices assessed, documented and approved NA Intravenous access site secured and place NA Valuables secured Linens and cotton and cotton/polyester blend (less than 51% polyester) Personal oil-based products / skin lotions / body lotions removed Wigs or hairpieces removed NA Smoking or tobacco materials removed NA Books / newspapers / magazines / loose paper removed Cologne, aftershave, perfume and deodorant removed Jewelry removed (may wrap wedding band) Make-up removed NA Hair care products removed Battery operated devices (external) removed NA Heating patches and chemical warmers removed NA Titanium eyewear removed NA Nail polish cured greater than 10 hours NA Casting material cured greater than 10 hours NA Hearing aids removed NA Loose dentures or partials  removed NA Prosthetics have been removed NA Patient demonstrates correct use of air break device (if applicable) Patient concerns have been addressed Patient grounding bracelet on and cord attached to chamber Specifics for Inpatients (complete in addition to above) Medication sheet sent with patient NA Intravenous medications needed or due during therapy sent with patient NA Drainage  tubes (e.g. nasogastric tube or chest tube secured and vented) NA Endotracheal or Tracheotomy tube secured NA Cuff deflated of air and inflated with saline NA Airway suctioned NA Notes Paper version used prior to treatment start. Electronic Signature(s) Signed: 03/19/2023 3:37:49 PM By: Demetria Pore Entered By: Demetria Pore on 02/22/2023 12:15:14

## 2023-03-20 ENCOUNTER — Encounter (HOSPITAL_BASED_OUTPATIENT_CLINIC_OR_DEPARTMENT_OTHER): Payer: Medicare Other | Admitting: General Surgery

## 2023-03-20 DIAGNOSIS — E119 Type 2 diabetes mellitus without complications: Secondary | ICD-10-CM | POA: Diagnosis not present

## 2023-03-20 DIAGNOSIS — N3041 Irradiation cystitis with hematuria: Secondary | ICD-10-CM | POA: Diagnosis not present

## 2023-03-20 DIAGNOSIS — C61 Malignant neoplasm of prostate: Secondary | ICD-10-CM | POA: Diagnosis not present

## 2023-03-20 DIAGNOSIS — L598 Other specified disorders of the skin and subcutaneous tissue related to radiation: Secondary | ICD-10-CM | POA: Diagnosis not present

## 2023-03-20 LAB — GLUCOSE, CAPILLARY
Glucose-Capillary: 141 mg/dL — ABNORMAL HIGH (ref 70–99)
Glucose-Capillary: 100 mg/dL — ABNORMAL HIGH (ref 70–99)

## 2023-03-20 NOTE — Progress Notes (Addendum)
 [name], [name] S ([id number]) [id number] Page 1 of 2 Visit Report for 03/20/2023 HBO Details Patient Name: Date of Service: [name], [name] 03/20/2023 12:30 PM Medical Record Number: [id number] Patient Account Number: [id number] Date of Birth/Sex: Treating RN: 08/11/1935 (87 y.o. 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 HBO Treatment Details Treatment Number: 13 Patient Type: Outpatient Chamber Type: Monoplace Chamber Serial #: [id number] Treatment Protocol: 2.0 ATA with 90 minutes oxygen , and no air breaks Treatment Details Compression Rate Down: 1.0 psi / minute De-Compression Rate Up: 2.0 psi / minute Air breaks and breathing Decompress Decompress Compress Tx Pressure Begins Reached periods Begins Ends (leave unused spaces blank) Chamber Pressure (ATA 1 2 ------2 1 ) Clock Time (24 hr) 12:04 12:16 - - - - - - 13:46 13:54 Treatment Length: 110 (minutes) Treatment Segments: 4 Vital Signs Capillary Blood Glucose Reference Range: 80 - 120 mg / dl HBO Diabetic Blood Glucose Intervention Range: <131 mg/dl or >750 mg/dl Type: Time Vitals Blood Pulse: Respiratory Temperature: Capillary Blood Glucose Pulse Action Taken: Pressure: Rate: Glucose (mg/dl): Meter #: Oximetry (%) Taken: Pre 11:37 135/53 60 18 97.1 141 none per protocol Post 13:57 154/51 51 18 97.5 100 given 8 oz Glucerna, re-measured BP Post 13:59 118/60 56 sitting position Treatment Response Treatment Toleration: Well Treatment Completion Status: Treatment Completed without Adverse Event Treatment Notes [name] arrived  with vital signs that were within normal range. He stated that he ate a meal prior to arrival. Blood glucose level was 141 mg/dL. After performing a safety check, patient was placed in the chamber which was compressed with 100% oxygen  at the rate of 1.5 psi/min after confirming normal ear equalization (gradual from slowest speed possible). He tolerated the treatment and subsequent decompression of the chamber at the rate of 1.5 psi/min. He denied any issues with ear equalization and/or pain associated with barotrauma. Post-treatment vital signs included heart rate of 51 bpm and diastolic BP of 51 mmHg. BP and pulse retaken once patient sat up with result of 118/60 mmHg and 56 bpm. He denied any symptoms related to bradycardia/hypotension. Blood glucose level was 100 mg/dL. He was given 8 oz Glucerna. After 15 minutes he was asymptomatic for hypoglycemia. He was stable upon discharge with his wife. Physician HBO Attestation: I certify that I supervised this HBO treatment in accordance with Medicare guidelines. A trained emergency response team is readily available per Yes hospital policies and procedures. Continue HBOT as ordered. Yes Electronic Signature(s) JAYTHEN, HAMME ([id number]) [id number] Page 2 of 2 Signed: 03/20/2023 3:06:18 PM By: Marolyn Delon MD FACS Previous Signature: 03/20/2023 2:25:03 PM Version By: Karolyn Sharper CHT EMT BS , , Entered By: Marolyn Delon on 03/20/2023 15:06:02 -------------------------------------------------------------------------------- HBO Safety Checklist Details Patient Name: Date of Service: [name] BRYAN, [name] 03/20/2023 12:30 PM Medical Record Number: [id number] Patient Account Number: [id number] Date of Birth/Sex: Treating RN: 03-09-1936 (87 y.o. [name] Primary Care Kali Deadwyler: [name] Other Clinician: Merleen Handing Referring Meylin Stenzel: Treating Spiros Greenfeld/Extender: Marolyn Delon [name] Devra in Treatment: 5 HBO Safety Checklist Items Safety Checklist Consent Form Signed Patient voided / foley secured and emptied Foley When did you last eato 1100 Chicken Salad sandwich with cheese Last dose of injectable or oral  agent 16 units insulin , 0830, metformin  Ostomy pouch emptied and vented if applicable NA All implantable devices assessed, documented and approved NA Intravenous access site secured and place NA Valuables secured Linens and cotton and cotton/polyester blend (less than 51% polyester) Personal oil-based products / skin lotions / body lotions removed Wigs or hairpieces removed NA Smoking or tobacco materials removed NA Books / newspapers / magazines / loose paper removed Cologne, aftershave, perfume and deodorant removed Jewelry removed (may wrap wedding band) Make-up removed NA Hair care products removed Battery operated devices (external) removed Heating patches and chemical warmers removed Titanium eyewear removed Nail polish cured greater than 10 hours NA Casting material cured greater than 10 hours NA Hearing aids removed NA Loose dentures or partials removed NA Prosthetics have been removed NA Patient demonstrates correct use of air break device (if applicable) Patient concerns have been addressed Patient grounding bracelet on and cord attached to chamber Specifics for Inpatients (complete in addition to above) Medication sheet sent with patient NA Intravenous medications needed or due during therapy sent with patient NA Drainage tubes (e.g. nasogastric tube or chest tube secured and vented) NA Endotracheal or Tracheotomy tube secured NA Cuff deflated of air and inflated with saline NA Airway suctioned NA Notes Paper version used prior to treatment start. Electronic Signature(s) Signed: 03/20/2023 2:19:28 PM By: Karolyn Sharper CHT EMT BS , , Entered By: Karolyn Sharper on 03/20/2023 14:19:28

## 2023-03-20 NOTE — Progress Notes (Signed)
 Staiger, Johncarlo S ([id number]) [id number] Page 1 of 2 Visit Report for 03/20/2023 Arrival Information Details Patient Name: Date of Service: [name], [name] 03/20/2023 12:30 PM Medical Record Number: [id number] Patient Account Number: [id number] Date of Birth/Sex: Treating RN: Aug 12, 1935 (87 y.o. NETTY [place] Primary Care Claire Bridge: [name] Other Clinician: Karolyn Sharper Referring Chanee Henrickson: Treating Kenzly Rogoff/Extender: Marolyn Delon [name] Devra in Treatment: 5 Visit Information History Since Last Visit All ordered tests and consults were completed: Yes Patient Arrived: Vannie Added or deleted any medications: No Arrival Time: 11:30 Any new allergies or adverse reactions: No Accompanied By: self Had a fall or experienced change in No Transfer Assistance: None activities of daily living that may affect Patient Identification Verified: Yes risk of falls: Secondary Verification Process Completed: Yes Signs or symptoms of abuse/neglect since last visito No Patient Requires Transmission-Based Precautions: No Hospitalized since last visit: No Patient Has Alerts: No Implantable device outside of the clinic excluding No cellular tissue based products placed in the center since last visit: Pain Present Now: No Electronic Signature(s) Signed: 03/20/2023 2:15:51 PM By: Karolyn Sharper CHT EMT BS , , Entered By: Karolyn Sharper on 03/20/2023 14:15:50 -------------------------------------------------------------------------------- Encounter Discharge Information Details Patient Name: Date of Service: [name], [name] 03/20/2023 12:30 PM Medical Record Number: [id number] Patient Account Number: [id number] Date of Birth/Sex: Treating RN: 05-Dec-1935 (87 y.o. NETTY [place] Primary Care Erline Siddoway: [name] Other Clinician: Karolyn Sharper Referring Raenette Sakata: Treating Vaden Becherer/Extender: Marolyn Delon [name] Devra in Treatment: 5 Encounter Discharge Information Items Discharge Condition: Stable Ambulatory Status: Walker Discharge Destination: Home Transportation: Private Auto Accompanied By: spouse Schedule Follow-up Appointment: No Clinical Summary of Care: Electronic Signature(s) Signed: 03/20/2023 2:26:11 PM By: Karolyn Sharper CHT EMT BS , , Entered By: Karolyn Sharper on 03/20/2023 14:26:11 [name], [name] ([id number]) [id number] Page 2 of 2 -------------------------------------------------------------------------------- Vitals Details Patient Name: Date of Service: [name], [name] 03/20/2023 12:30 PM Medical Record Number: [id number] Patient Account Number: [id number] Date of Birth/Sex: Treating RN: 1935-11-03 (87 y.o. NETTY [place] Primary Care Montoya Watkin: [name] Other Clinician: Karolyn Sharper Referring Burnett Spray: Treating Offie Pickron/Extender: Marolyn Delon [name] Devra in Treatment: 5 Vital Signs Time Taken: 11:37 Temperature (F): 97.1 Height (in): 66 Pulse (bpm): 60 Weight (lbs): 150 Respiratory Rate (breaths/min): 18 Body Mass Index (BMI): 24.2 Blood Pressure (mmHg): 135/53 Capillary Blood Glucose (mg/dl): 858 Reference Range: 80 - 120 mg / dl Electronic Signature(s) Signed: 03/20/2023 2:17:05 PM By: Karolyn Sharper CHT EMT BS , , Entered By: Karolyn Sharper on 03/20/2023 14:17:05

## 2023-03-21 NOTE — Progress Notes (Signed)
 Osterman, Jameir S ([id number]) [id number] Page 1 of 1 Visit Report for 03/20/2023 SuperBill Details Patient Name: Date of Service: [name], [name] 03/20/2023 Medical Record Number: [id number] Patient Account Number: [id number] Date of Birth/Sex: Treating RN: 1935/04/09 (88 y.o. [name] Primary Care Provider: [name] Other Clinician: Karolyn Sharper Referring Provider: Treating Provider/Extender: Marolyn Delon [name] Devra in Treatment: 5 Diagnosis Coding ICD-10 Codes Code Description N30.41 Irradiation cystitis with hematuria C61 Malignant neoplasm of prostate E11.9 Type 2 diabetes mellitus without complications Facility Procedures CPT4 Code Description Modifier Quantity [id number] [id number]-(Facility Use Only) HBOT full body chamber, , 4 ICD-10 Diagnosis Description N30.41 Irradiation cystitis with hematuria C61 Malignant neoplasm of prostate E11.9 Type 2 diabetes mellitus without complications Physician Procedures Quantity CPT4 Code Description Modifier [id number] - WC PHYS HYPERBARIC OXYGEN  THERAPY 1 ICD-10 Diagnosis Description N30.41 Irradiation cystitis with hematuria C61 Malignant neoplasm of prostate E11.9 Type 2 diabetes mellitus without complications Electronic Signature(s) Signed: 03/20/2023 2:25:18 PM By: Karolyn Sharper CHT EMT BS , , Signed: 03/20/2023 3:02:08 PM By: Marolyn Delon MD FACS Entered By: Karolyn Sharper on 03/20/2023 14:25:17

## 2023-03-21 NOTE — Progress Notes (Signed)
 Regan, [name] ([id number]) [id number] Page 1 of 6 Visit Report for 03/20/2023 Chief Complaint Document Details Patient Name: Date of Service: [name], [name] 03/20/2023 11:30 A M Medical Record Number: [id number] Patient Account Number: [id number] Date of Birth/Sex: Treating RN: 05/12/1935 (88 y.o. M) Primary Care Provider: Rexanne [name] Other Clinician: Referring Provider: Treating Provider/Extender: [name] [name] [name] in Treatment: 5 Information Obtained from: Patient Chief Complaint Patient presents to [facility] for HBO eval due to radiation cystitis with hematuria Electronic Signature([name]) Signed: 03/20/2023 12:57:02 PM By: [name] Delon MD FACS Entered By: [name] on 03/20/2023 12:35:00 -------------------------------------------------------------------------------- HPI Details Patient Name: Date of Service: [name], [name]. 03/20/2023 11:30 A M Medical Record Number: [id number] Patient Account Number: [id number] Date of Birth/Sex: Treating RN: [birth year] (88 y.o. M) Primary Care Provider: Rexanne [name] Other Clinician: Referring Provider: Treating Provider/Extender: [name] [name] [name] in Treatment: 5 History of Present Illness HPI Description: ADMISSION 02/07/2023 This is an 88 year old man with a past medical history significant for prostate cancer. He underwent transurethral resection of the prostate in February 2017 and subsequently underwent radiation therapy to the prostate, seminal vesicles, and pelvic lymph nodes. He received an initial 45 Gy to the prostate seminal vesicles and pelvic lymph nodes in 25 fractions of 1.8 Gy. He also received a prostate-only boost to 75 Gy with 15 additional fractions of 2.0 Gy. In June 2024, he developed gross hematuria with clot retention. He was treated with continuous bladder irrigation and indwelling Foley catheter placement. He  also required cystoscopy with fulguration and clot evacuation both in June as well as more recently, in October of this year. 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 He has had enough blood loss to require multiple transfusions during his hospitalizations. Most recently, he had a suprapubic catheter placed by interventional radiology. This was placed on 24 January 2023. He has been referred by urology for consideration of hyperbaric oxygen  therapy to treat his radiation cystitis with gross hematuria. 03/20/2023: This is a 30-day update for hyperbaric oxygen  therapy. The patient has actually only had 12 treatments to date. He continues to experience issues with bladder spasm and pain along with hematuria. He was actually en route to the hospital last night with obstruction when he was holding things up broke loose and urine flowed freely into his Foley bag again. He denies any difficulty with the hyperbaric oxygen  treatment thus far. Electronic Signature([name]) Signed: 03/20/2023 12:57:02 PM By: [name] Delon MD FACS Entered By: [name] on 03/20/2023 12:36:29 Fifita, [name] ([id number]) [id number] Page 2 of 6 -------------------------------------------------------------------------------- Physical Exam Details Patient Name: Date of Service: [name], [name] 03/20/2023 11:30 A M Medical Record Number: [id number] Patient Account Number: [id number] Date of Birth/Sex: Treating RN: 08/25/1935 (88 y.o. M) Primary Care Provider: Rexanne [name] Other Clinician: Referring Provider: Treating Provider/Extender: [name] [name] [name] in Treatment: 5 Constitutional no acute distress. Respiratory Normal work of breathing on room air.. Notes 03/20/2023: The urine in his Foley bag has a light pink-orange tinge to it, but no gross clots are appreciated. Electronic Signature([name]) Signed: 03/20/2023 12:57:02 PM By:  [name] Delon MD FACS Entered By: [name] on 03/20/2023 12:37:11 -------------------------------------------------------------------------------- Physician Orders Details Patient Name: Date of Service: [name], [name]. 03/20/2023 11:30 A M Medical Record Number: [id number] Patient Account Number: [id number] Date of Birth/Sex: Treating RN: [birth year] (88 y.o. [name] Primary Care Provider: Rexanne [name] Other Clinician: Referring Provider: Treating Provider/Extender: [name] [name],  Tanda Duos in Treatment: 5 The following information was scribed by: Merleen Handing The information was scribed for: [name] Nest Verbal / Phone Orders: No Diagnosis Coding ICD-10 Coding Code Description N30.41 Irradiation cystitis with hematuria C61 Malignant neoplasm of prostate E11.9 Type 2 diabetes mellitus without complications Follow-up Appointments Return appointment in 1 month. - HBO check in with Dr. Marolyn Hyperbaric Oxygen  Therapy Evaluate for HBO Therapy Indication: - radiation cystitis If appropriate for treatment, begin HBOT per protocol: 2.0 ATA for 90 Minutes without A Breaks ir Total Number of Treatments: - 40 One treatments per day (delivered Monday through Friday unless otherwise specified in Special Instructions below): Finger stick Blood Glucose Pre- and Post- HBOT Treatment. Follow Hyperbaric Oxygen  Glycemia Protocol Give two 4oz orange juices in addition to Glucerna when the glycemic protocol is used. A frin (Oxymetazoline HCL) 0.05% nasal spray - 1 spray in both nostrils daily as needed prior to HBO treatment for difficulty clearing ears GLYCEMIA INTERVENTIONS PROTOCOL PRE-HBO GLYCEMIA INTERVENTIONS ACTION INTERVENTION Obtain pre-HBO capillary blood glucose (ensure 1 physician order is in chart). A. Notify HBO physician and await physician orders. 2 If result is 70 mg/dl or below: B. If the result meets the hospital definition of  a critical result, follow hospital policy. A. Give patient an 8 ounce Glucerna Shake, an [name], [name] ([id number]) [id number] Page 3 of 6 8 ounce Ensure, or 8 ounces of a Glucerna/Ensure equivalent dietary supplement*. B. Wait 30 minutes. If result is 71 mg/dl to 869 mg/dl: C. Retest patients capillary blood glucose (CBG). D. If result greater than or equal to 110 mg/dl, proceed with HBO. If result less than 110 mg/dl, notify HBO physician and consider holding HBO. If result is 131 mg/dl to 750 mg/dl: A. Proceed with HBO. A. Notify HBO physician and await physician orders. B. It is recommended to hold HBO and do If result is 250 mg/dl or greater: blood/urine ketone testing. C. If the result meets the hospital definition of a critical result, follow hospital policy. POST-HBO GLYCEMIA INTERVENTIONS ACTION INTERVENTION Obtain post HBO capillary blood glucose (ensure 1 physician order is in chart). A. Notify HBO physician and await physician orders. 2 If result is 70 mg/dl or below: B. If the result meets the hospital definition of a critical result, follow hospital policy. A. Give patient an 8 ounce Glucerna Shake, an 8 ounce Ensure, or 8 ounces of a Glucerna/Ensure equivalent dietary supplement*. B. Wait 15 minutes for symptoms of If result is 71 mg/dl to 899 mg/dl: hypoglycemia (i.e. nervousness, anxiety, sweating, chills, clamminess, irritability, confusion, tachycardia or dizziness). C. If patient asymptomatic, discharge patient. If patient symptomatic, repeat capillary blood glucose (CBG) and notify HBO physician. If result is 101 mg/dl to 750 mg/dl: A. Discharge patient. A. Notify HBO physician and await physician orders. B. It is recommended to do blood/urine ketone If result is 250 mg/dl or greater: testing. C. If the result meets the hospital definition of a critical result, follow hospital policy. *Juice or candies are NOT  equivalent products. If patient refuses the Glucerna or Ensure, please consult the hospital dietitian for an appropriate substitute. Electronic Signature([name]) Signed: 03/20/2023 3:02:08 PM By: [name] Nest MD FACS Signed: 03/20/2023 4:46:54 PM By: Merleen Handing RN, BSN Entered By: Boehlein, Linda on 03/20/2023 13:27:15 -------------------------------------------------------------------------------- Problem List Details Patient Name: Date of Service: [name], [name]. 03/20/2023 11:30 A M Medical Record Number: [id number] Patient Account Number: [id number] Date of Birth/Sex: Treating RN: 05-09-1935 (88 y.o. [name] Primary Care Provider:  Rexanne [name] Other Clinician: Referring Provider: Treating Provider/Extender: [name] [name] [name] in Treatment: 5 Active Problems ICD-10 Encounter Code Description Active Date MDM Diagnosis N30.41 Irradiation cystitis with hematuria 02/07/2023 No Yes C61 Malignant neoplasm of prostate 02/07/2023 No Yes Couillard, Jerrico [name] ([id number]) [id number] Page 4 of 6 E11.9 Type 2 diabetes mellitus without complications 02/07/2023 No Yes Inactive Problems Resolved Problems Electronic Signature([name]) Signed: 03/20/2023 12:57:02 PM By: [name] Delon MD FACS Entered By: [name] on 03/20/2023 12:34:49 -------------------------------------------------------------------------------- Progress Note Details Patient Name: Date of Service: [name], [name]. 03/20/2023 11:30 A M Medical Record Number: [id number] Patient Account Number: [id number] Date of Birth/Sex: Treating RN: Oct 05, 1935 (88 y.o. M) Primary Care Provider: Rexanne [name] Other Clinician: Referring Provider: Treating Provider/Extender: [name] [name] [name] in Treatment: 5 Subjective Chief Complaint Information obtained from Patient Patient presents to the [facility] Care center for HBO eval due to radiation  cystitis with hematuria History of Present Illness (HPI) ADMISSION 02/07/2023 This is an 88 year old man with a past medical history significant for prostate cancer. He underwent transurethral resection of the prostate in February 2017 and subsequently underwent radiation therapy to the prostate, seminal vesicles, and pelvic lymph nodes. He received an initial 45 Gy to the prostate seminal vesicles and pelvic lymph nodes in 25 fractions of 1.8 Gy. He also received a prostate-only boost to 75 Gy with 15 additional fractions of 2.0 Gy. In June 2024, he developed gross hematuria with clot retention. He was treated with continuous bladder irrigation and indwelling Foley catheter placement. He also required cystoscopy with fulguration and clot evacuation both in June as well as more recently, in October of this year. 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 He has had enough blood loss to require multiple transfusions during his hospitalizations. Most recently, he had a suprapubic catheter placed by interventional radiology. This was placed on 24 January 2023. He has been referred by urology for consideration of hyperbaric oxygen  therapy to treat his radiation cystitis with gross hematuria. 03/20/2023: This is a 30-day update for hyperbaric oxygen  therapy. The patient has actually only had 12 treatments to date. He continues to experience issues with bladder spasm and pain along with hematuria. He was actually en route to the hospital last night with obstruction when he was holding things up broke loose and urine flowed freely into his Foley bag again. He denies any difficulty with the hyperbaric oxygen  treatment thus far. Objective Constitutional no acute distress. Respiratory Normal work of breathing on room air.. General Notes: 03/20/2023: The urine in his Foley bag has a light pink-orange tinge to it, but no gross clots are appreciated. Assessment Active  Problems ICD-10 Irradiation cystitis with hematuria Malignant neoplasm of prostate [name], [name] ([id number]) [id number] Page 5 of 6 Type 2 diabetes mellitus without complications Plan Follow-up Appointments: Return appointment in 1 month. - HBO check in with Dr. Marolyn Hyperbaric Oxygen  Therapy: Evaluate for HBO Therapy Indication: - radiation cystitis If appropriate for treatment, begin HBOT per protocol: 2.0 ATA for 90 Minutes without Air Breaks T Number of Treatments: - 40 otal One treatments per day (delivered Monday through Friday unless otherwise specified in Special Instructions below): Finger stick Blood Glucose Pre- and Post- HBOT Treatment. Follow Hyperbaric Oxygen  Glycemia Protocol Give two 4oz orange juices in addition to Glucerna when the glycemic protocol is used. Afrin (Oxymetazoline HCL) 0.05% nasal spray - 1 spray in both nostrils daily as needed prior to HBO treatment for difficulty clearing ears 03/20/2023: 30-day update  for hyperbaric oxygen  therapy. He has only received 12 treatments and is still fairly symptomatic regarding his radiation cystitis with hematuria. He was reassured that it can take 30 treatments or more to achieve satisfactory results. He was encouraged by his. Continue hyperbaric oxygen  therapy as ordered. 30-day follow-up. Electronic Signature([name]) Signed: 03/20/2023 3:02:08 PM By: [name] Nest MD FACS Signed: 03/20/2023 4:46:54 PM By: Merleen Handing RN, BSN Previous Signature: 03/20/2023 12:57:02 PM Version By: [name] Nest MD FACS Entered By: Merleen Handing on 03/20/2023 13:29:28 -------------------------------------------------------------------------------- SuperBill Details Patient Name: Date of Service: [name], [name]. 03/20/2023 Medical Record Number: [id number] Patient Account Number: [id number] Date of Birth/Sex: Treating RN: [birth year] (88 y.o. M) Primary Care Provider: Rexanne [name] Other  Clinician: Referring Provider: Treating Provider/Extender: [name] Nest [name] [name] in Treatment: 5 Diagnosis Coding ICD-10 Codes Code Description N30.41 Irradiation cystitis with hematuria C61 Malignant neoplasm of prostate E11.9 Type 2 diabetes mellitus without complications Facility Procedures : CPT4 Code: [id number] Description: 00787 - WOUND CARE VISIT-LEV 2 EST PT Modifier: Quantity: 1 Physician Procedures Electronic Signature([name]) Signed: 03/20/2023 3:02:08 PM By: [name] Nest MD FACS Signed: 03/20/2023 4:46:54 PM By: Merleen Handing RN, BSN Previous Signature: 03/20/2023 12:57:02 PM Version By: [name] Nest MD FACS Entered By: Merleen Handing on 03/20/2023 13:28:37

## 2023-03-21 NOTE — Progress Notes (Signed)
 Turvey, Zian S ([id number]) [id number] Page 1 of 6 Visit Report for 03/20/2023 Arrival Information Details Patient Name: Date of Service: [name], [name] 03/20/2023 11:30 A M Medical Record Number: [id number] Patient Account Number: [id number] Date of Birth/Sex: Treating RN: 1935/10/25 (88 y.o. [name] [place], [place] Primary Care Asani Mcburney: [name] Other Clinician: Referring Daegan Arizmendi: Treating Adarius Tigges/Extender: Marolyn Delon [name] Devra in Treatment: 5 Visit Information History Since Last Visit Added or deleted any medications: No Patient Arrived: Vannie Any new allergies or adverse reactions: No Arrival Time: 11:34 Had a fall or experienced change in No Accompanied By: self activities of daily living that may affect Transfer Assistance: None risk of falls: Patient Identification Verified: Yes Signs or symptoms of abuse/neglect since last visito No Secondary Verification Process Completed: Yes Hospitalized since last visit: No Patient Requires Transmission-Based Precautions: No Implantable device outside of the clinic excluding No Patient Has Alerts: No cellular tissue based products placed in the center since last visit: Pain Present Now: No Electronic Signature(s) Signed: 03/20/2023 4:46:54 PM By: Aver Rock RN, BSN Entered By: Aver Rock on 03/20/2023 13:25:28 -------------------------------------------------------------------------------- Clinic Level of Care Assessment Details Patient Name: Date of Service: [facility], Chance S. 03/20/2023 11:30 A M Medical Record Number: [id number] Patient Account Number: [id number] Date of Birth/Sex: Treating RN: 13-Feb-1936 (88 y.o. [name] [place] Primary Care Rahn Lacuesta: [name] Other Clinician: Referring Zygmund Passero: Treating Matty Deamer/Extender: Marolyn Delon [name] Devra in Treatment: 5 Clinic Level of Care Assessment Items TOOL 4 Quantity Score []  - 0 Use  when only an EandM is performed on FOLLOW-UP visit ASSESSMENTS - Nursing Assessment / Reassessment X- 1 10 Reassessment of Co-morbidities (includes updates in patient status) X- 1 5 Reassessment of Adherence to Treatment Plan ASSESSMENTS - Wound and Skin A ssessment / Reassessment []  - 0 Simple Wound Assessment / Reassessment - one wound []  - 0 Complex Wound Assessment / Reassessment - multiple wounds []  - 0 Dermatologic / Skin Assessment (not related to wound area) ASSESSMENTS - Focused Assessment []  - 0 Circumferential Edema Measurements - multi extremities []  - 0 Nutritional Assessment / Counseling / Intervention []  - 0 Lower Extremity Assessment (monofilament, tuning fork, pulses) KYLIL, SWOPES S ([id number]) [id number] Page 2 of 6 []  - 0 Peripheral Arterial Disease Assessment (using hand held doppler) ASSESSMENTS - Ostomy and/or Continence Assessment and Care []  - 0 Incontinence Assessment and Management []  - 0 Ostomy Care Assessment and Management (repouching, etc.) PROCESS - Coordination of Care X - Simple Patient / Family Education for ongoing care 1 15 []  - 0 Complex (extensive) Patient / Family Education for ongoing care X- 1 10 Staff obtains Chiropractor, Records, T Results / Process Orders est []  - 0 Staff telephones HHA, Nursing Homes / Clarify orders / etc []  - 0 Routine Transfer to another Facility (non-emergent condition) []  - 0 Routine Hospital Admission (non-emergent condition) []  - 0 New Admissions / Manufacturing Engineer / Ordering NPWT Apligraf, etc. , []  - 0 Emergency Hospital Admission (emergent condition) X- 1 10 Simple Discharge Coordination []  - 0 Complex (extensive) Discharge Coordination PROCESS - Special Needs []  - 0 Pediatric / Minor Patient Management []  - 0 Isolation Patient Management []  - 0 Hearing / Language / Visual special needs []  - 0 Assessment of Community assistance (transportation, D/C  planning, etc.) []  - 0 Additional assistance / Altered mentation []  - 0 Support Surface(s) Assessment (bed, cushion, seat, etc.) INTERVENTIONS - Wound Cleansing / Measurement []  - 0 Simple Wound Cleansing - one wound []  -  0 Complex Wound Cleansing - multiple wounds []  - 0 Wound Imaging (photographs - any number of wounds) []  - 0 Wound Tracing (instead of photographs) []  - 0 Simple Wound Measurement - one wound []  - 0 Complex Wound Measurement - multiple wounds INTERVENTIONS - Wound Dressings []  - 0 Small Wound Dressing one or multiple wounds []  - 0 Medium Wound Dressing one or multiple wounds []  - 0 Large Wound Dressing one or multiple wounds []  - 0 Application of Medications - topical []  - 0 Application of Medications - injection INTERVENTIONS - Miscellaneous []  - 0 External ear exam []  - 0 Specimen Collection (cultures, biopsies, blood, body fluids, etc.) []  - 0 Specimen(s) / Culture(s) sent or taken to Lab for analysis []  - 0 Patient Transfer (multiple staff / Nurse, Adult / Similar devices) []  - 0 Simple Staple / Suture removal (25 or less) []  - 0 Complex Staple / Suture removal (26 or more) []  - 0 Hypo / Hyperglycemic Management (close monitor of Blood Glucose) []  - 0 Ankle / Brachial Index (ABI) - do not check if billed separately [name], [name] ([id number]) [id number] Page 3 of 6 X- 1 5 Vital Signs Has the patient been seen at the hospital within the last three years: Yes Total Score: 55 Level Of Care: New/Established - Level 2 Electronic Signature(s) Signed: 03/20/2023 4:46:54 PM By: Merleen Handing RN, BSN Entered By: Merleen Handing on 03/20/2023 13:27:59 -------------------------------------------------------------------------------- Multi Wound Chart Details Patient Name: Date of Service: [name], [name] 03/20/2023 11:30 A M Medical Record Number: [id number] Patient Account Number: [id number] Date of Birth/Sex:  Treating RN: 1936-03-10 (88 y.o. M) Primary Care Venson Ferencz: [name] Other Clinician: Referring Ran Tullis: Treating Antionio Negron/Extender: Marolyn Delon [name] Devra in Treatment: 5 [Treatment Notes:Wound Assessments Treatment Notes] Electronic Signature(s) Signed: 03/20/2023 12:57:02 PM By: Marolyn Delon MD FACS Entered By: Marolyn Delon on 03/20/2023 12:34:55 -------------------------------------------------------------------------------- Multi-Disciplinary Care Plan Details Patient Name: Date of Service: [name], [name] 03/20/2023 11:30 A M Medical Record Number: [id number] Patient Account Number: [id number] Date of Birth/Sex: Treating RN: 1935-08-11 (88 y.o. [name] Merleen Handing Primary Care Janny Crute: [name] Other Clinician: Referring Aymee Fomby: Treating Jackqulyn Mendel/Extender: Marolyn Delon [name] Devra in Treatment: 5 Multidisciplinary Care Plan reviewed with physician Active Inactive HBO Nursing Diagnoses: Anxiety related to knowledge deficit of hyperbaric oxygen  therapy and treatment procedures Potential for barotraumas to ears, sinuses, teeth, and lungs or cerebral gas embolism related to changes in atmospheric pressure inside hyperbaric oxygen  chamber Potential for oxygen  toxicity seizures related to delivery of 100% oxygen  at an increased atmospheric pressure Potential for pulmonary oxygen  toxicity related to delivery of 100% oxygen  at an increased atmospheric pressure Goals: Barotrauma will be prevented during HBO2 Date Initiated: 02/07/2023 Target Resolution Date: 04/17/2023 Goal Status: Active Patient and/or family will be able to state/discuss factors appropriate to the management of their disease process during treatment Date Initiated: 02/07/2023 Target Resolution Date: 04/17/2023 Goal Status: Active Patient will tolerate the hyperbaric oxygen  therapy treatment JAYDIS, DUCHENE ([id number]) [id number]  Page 4 of 6 Date Initiated: 02/07/2023 T arget Resolution Date: 04/17/2023 Goal Status: Active Patient will tolerate the internal climate of the chamber Date Initiated: 02/07/2023 T arget Resolution Date: 04/17/2023 Goal Status: Active Patient/caregiver will verbalize understanding of HBO goals, rationale, procedures and potential hazards Date Initiated: 02/07/2023 T arget Resolution Date: 04/17/2023 Goal Status: Active Signs and symptoms of pulmonary oxygen  toxicity will be recognized and promptly addressed Date Initiated: 02/07/2023 T arget Resolution Date: 04/17/2023 Goal Status: Active Signs  and symptoms of seizure will be recognized and promptly addressed ; seizing patients will suffer no harm Date Initiated: 02/07/2023 T arget Resolution Date: 04/17/2023 Goal Status: Active Interventions: Administer decongestants, per physician orders, prior to HBO2 Administer the correct therapeutic gas delivery based on the patients needs and limitations, per physician order Assess and provide for patients comfort related to the hyperbaric environment and equalization of middle ear Assess for signs and symptoms related to adverse events, including but not limited to confinement anxiety, pneumothorax, oxygen  toxicity and baurotrauma Assess patient for any history of confinement anxiety Assess patient's knowledge and expectations regarding hyperbaric medicine and provide education related to the hyperbaric environment, goals of treatment and prevention of adverse events Implement protocols to decrease risk of pneumothorax in high risk patients Notes: Electronic Signature(s) Signed: 03/20/2023 4:46:54 PM By: Merleen Handing RN, BSN Entered By: Boehlein, Linda on 03/20/2023 11:28:49 -------------------------------------------------------------------------------- Non-Wound Condition Assessment Details Patient Name: Date of Service: [name], [name] 03/20/2023 11:30 A M Medical Record Number:  [id number] Patient Account Number: [id number] Date of Birth/Sex: Treating RN: June 23, 1935 (88 y.o. [name] Merleen Handing Primary Care Elizzie Westergard: [name] Other Clinician: Referring Tiaunna Buford: Treating Aspyn Warnke/Extender: Marolyn Delon [name] Devra in Treatment: 5 Non-Wound Condition: Condition: Cystitis Location: Other: bladder Side: Periwound Skin Texture Texture Color No Abnormalities Noted: No No Abnormalities Noted: No Moisture No Abnormalities Noted: No Notes blood tinged urine in foley bag Electronic Signature(s) Signed: 03/20/2023 4:46:54 PM By: Merleen Handing RN, BSN Entered By: Merleen Handing on 03/20/2023 13:26:37 [name] ([id number]) [id number] Page 5 of 6 -------------------------------------------------------------------------------- Pain Assessment Details Patient Name: Date of Service: [name], [name] 03/20/2023 11:30 A M Medical Record Number: [id number] Patient Account Number: [id number] Date of Birth/Sex: Treating RN: 1935-10-13 (88 y.o. [name] Merleen Handing Primary Care Shavana Calder: [name] Other Clinician: Referring Renn Stille: Treating Renly Roots/Extender: Marolyn Delon [name] Devra in Treatment: 5 Active Problems Location of Pain Severity and Description of Pain Patient Has Paino No Site Locations Rate the pain. Current Pain Level: 0 Pain Management and Medication Current Pain Management: Electronic Signature(s) Signed: 03/20/2023 4:46:54 PM By: Merleen Handing RN, BSN Entered By: Merleen Handing on 03/20/2023 13:25:38 -------------------------------------------------------------------------------- Patient/Caregiver Education Details Patient Name: Date of Service: [name] 12/31/2024andnbsp11:30 A M Medical Record Number: [id number] Patient Account Number: [id number] Date of Birth/Gender: Treating RN: [birth year] (88 y.o. [name] Merleen Handing Primary Care Physician: [name] Other Clinician: Referring Physician: Treating Physician/Extender: Marolyn Delon [name] Devra in Treatment: 5 Education Assessment Education Provided To: Patient Education Topics Provided Hyperbaric Oxygenation: Methods: Explain/Verbal Responses: Reinforcements needed, State content correctly [name], [name] ([id number]) [id number] Page 6 of 6 Electronic Signature(s) Signed: 03/20/2023 4:46:54 PM By: Merleen Handing RN, BSN Entered By: Boehlein, Linda on 03/20/2023 11:29:44

## 2023-03-22 ENCOUNTER — Encounter (HOSPITAL_BASED_OUTPATIENT_CLINIC_OR_DEPARTMENT_OTHER): Payer: Medicare Other | Attending: General Surgery | Admitting: General Surgery

## 2023-03-22 DIAGNOSIS — N3041 Irradiation cystitis with hematuria: Secondary | ICD-10-CM | POA: Diagnosis present

## 2023-03-22 DIAGNOSIS — C61 Malignant neoplasm of prostate: Secondary | ICD-10-CM | POA: Diagnosis not present

## 2023-03-22 DIAGNOSIS — Y842 Radiological procedure and radiotherapy as the cause of abnormal reaction of the patient, or of later complication, without mention of misadventure at the time of the procedure: Secondary | ICD-10-CM | POA: Diagnosis not present

## 2023-03-22 LAB — GLUCOSE, CAPILLARY
Glucose-Capillary: 177 mg/dL — ABNORMAL HIGH (ref 70–99)
Glucose-Capillary: 184 mg/dL — ABNORMAL HIGH (ref 70–99)

## 2023-03-22 NOTE — Progress Notes (Signed)
 [name], [name] S ([id number]) [id number] Page 1 of 2 Visit Report for 03/22/2023 HBO Details Patient Name: Date of Service: [name], [name] 03/22/2023 12:30 PM Medical Record Number: [id number] Patient Account Number: [id number] Date of Birth/Sex: Treating RN: [birth year] (88 y.o. 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 HBO Treatment Details Treatment Number: 14 Patient Type: Outpatient Chamber Type: Monoplace Chamber Serial #: [id number] Treatment Protocol: 2.0 ATA with 90 minutes oxygen , and no air breaks Treatment Details Compression Rate Down: 1.0 psi / minute De-Compression Rate Up: 1.5 psi / minute Air breaks and breathing Decompress Decompress Compress Tx Pressure Begins Reached periods Begins Ends (leave unused spaces blank) Chamber Pressure (ATA 1 2 ------2 1 ) Clock Time (24 hr) 13:05 13:22 - - - - - - 14:52 15:03 Treatment Length: 118 (minutes) Treatment Segments: 4 Vital Signs Capillary Blood Glucose Reference Range: 80 - 120 mg / dl HBO Diabetic Blood Glucose Intervention Range: <131 mg/dl or >750 mg/dl Type: Time Vitals Blood Respiratory Capillary Blood Glucose Pulse Action Pulse: Temperature: Taken: Pressure: Rate: Glucose (mg/dl): Meter #: Oximetry (%) Taken: Pre 12:42 168/69 61 18 98.2 177 none per protocol Post 15:06 163/77 66 18 98.1 184 none per protocol Treatment Response Treatment Toleration: Well Treatment Completion Status: Treatment Completed without Adverse Event Treatment Notes Mr. Kindred arrived with vital signs that were within normal range. He  stated that he ate a meal prior to arrival. After performing a safety check, patient was placed in the chamber which was compressed with 100% oxygen  at the rate of 1.5 psi/min after confirming normal ear equalization (gradual from slowest speed possible). He tolerated the treatment and subsequent decompression of the chamber at the rate of 1.5 psi/min. He denied any issues with ear equalization and/or pain associated with barotrauma. Post-treatment vital signs were within normal range. He was stable upon discharge with his wife. Physician HBO Attestation: I certify that I supervised this HBO treatment in accordance with Medicare guidelines. A trained emergency response team is readily available per Yes hospital policies and procedures. Continue HBOT as ordered. Yes Electronic Signature(s) Signed: 03/22/2023 4:10:41 PM By: Marolyn Delon MD FACS Previous Signature: 03/22/2023 3:34:48 PM Version By: Karolyn Sharper CHT EMT BS , , Entered By: Marolyn Delon on 03/22/2023 16:10:41 Pitz, Jamorris S ([id number]) [id number] Page 2 of 2 -------------------------------------------------------------------------------- HBO Safety Checklist Details Patient Name: Date of Service: [name], [name] 03/22/2023 12:30 PM Medical Record Number: [id number] Patient Account Number: [id number] Date of Birth/Sex: Treating RN: 1935-06-03 (88 y.o. [name] Primary Care Shameca Landen: [name] Other Clinician: Karolyn Sharper Referring Kimani Bedoya: Treating Florette Thai/Extender: Marolyn Delon [name] Devra in Treatment: 6 HBO Safety Checklist Items Safety Checklist Consent Form Signed Patient voided / foley secured and emptied Foley 1130 Wild Rice, Scalloped Potatoes, [place]- When did you last eato eyed Peas, Fat Back Last dose of injectable or oral agent 0800 - humalog, metformin  Ostomy pouch emptied and vented if applicable NA All implantable devices assessed,  documented and approved Freestyle Libre 2 and3 on approved list Intravenous access site secured and place NA Valuables secured Linens and cotton and cotton/polyester blend (less than 51% polyester) Personal oil-based products / skin lotions / body lotions removed Wigs or hairpieces removed NA Smoking  or tobacco materials removed NA Books / newspapers / magazines / loose paper removed Cologne, aftershave, perfume and deodorant removed Jewelry removed (may wrap wedding band) Make-up removed NA Hair care products removed Battery operated devices (external) removed Heating patches and chemical warmers removed Titanium eyewear removed NA Nail polish cured greater than 10 hours NA Casting material cured greater than 10 hours NA Hearing aids removed NA Loose dentures or partials removed NA Prosthetics have been removed NA Patient demonstrates correct use of air break device (if applicable) Patient concerns have been addressed Patient grounding bracelet on and cord attached to chamber Specifics for Inpatients (complete in addition to above) Medication sheet sent with patient NA Intravenous medications needed or due during therapy sent with patient NA Drainage tubes (e.g. nasogastric tube or chest tube secured and vented) NA Endotracheal or Tracheotomy tube secured NA Cuff deflated of air and inflated with saline NA Airway suctioned NA Notes Paper version used prior to treatment start. Electronic Signature(s) Signed: 03/22/2023 2:13:39 PM By: Karolyn Sharper CHT EMT BS , , Entered By: Karolyn Sharper on 03/22/2023 14:13:39

## 2023-03-22 NOTE — Progress Notes (Signed)
 Burdell, Leng S ([id number]) [id number] Page 1 of 1 Visit Report for 03/22/2023 Arrival Information Details Patient Name: Date of Service: KELDRICK, POMPLUN 03/22/2023 12:30 PM Medical Record Number: [id number] Patient Account Number: [id number] Date of Birth/Sex: Treating RN: 1935-10-03 (88 y.o. [name] Primary Care Airi Copado: [name] Other Clinician: Karolyn Sharper Referring Shalan Neault: Treating Tasha Jindra/Extender: Marolyn Delon [name] Devra in Treatment: 6 Visit Information History Since Last Visit All ordered tests and consults were completed: Yes Patient Arrived: Vannie Added or deleted any medications: No Arrival Time: 12:30 Any new allergies or adverse reactions: No Accompanied By: self Had a fall or experienced change in No Transfer Assistance: None activities of daily living that may affect Patient Identification Verified: Yes risk of falls: Secondary Verification Process Completed: Yes Signs or symptoms of abuse/neglect since last visito No Patient Requires Transmission-Based Precautions: No Hospitalized since last visit: No Patient Has Alerts: No Implantable device outside of the clinic excluding No cellular tissue based products placed in the center since last visit: Pain Present Now: No Electronic Signature(s) Signed: 03/22/2023 2:09:57 PM By: Karolyn Sharper CHT EMT BS , , Entered By: Karolyn Sharper on 03/22/2023 14:09:57 -------------------------------------------------------------------------------- Vitals Details Patient Name: Date of Service: [name], Donavyn S. 03/22/2023 12:30 PM Medical Record Number: [id number] Patient Account Number: [id number] Date of Birth/Sex: Treating RN: July 08, 1935 (88 y.o. [name] Primary Care Dietrich Ke: [name] Other Clinician: Karolyn Sharper Referring Helen Cuff: Treating Tahjae Durr/Extender: Marolyn Delon [name] Devra in Treatment: 6 Vital  Signs Time Taken: 12:42 Temperature (F): 98.2 Height (in): 66 Pulse (bpm): 61 Weight (lbs): 150 Respiratory Rate (breaths/min): 18 Body Mass Index (BMI): 24.2 Blood Pressure (mmHg): 168/69 Capillary Blood Glucose (mg/dl): 822 Reference Range: 80 - 120 mg / dl Electronic Signature(s) Signed: 03/22/2023 2:10:52 PM By: Karolyn Sharper CHT EMT BS , , Entered By: Karolyn Sharper on 03/22/2023 14:10:52

## 2023-03-22 NOTE — Progress Notes (Signed)
 Bedingfield, Braydon S ([id number]) [phone].pdf Page 1 of 1 Visit Report for 03/22/2023 SuperBill Details Patient Name: Date of Service: MONT, JAGODA 03/22/2023 Medical Record Number: [id number] Patient Account Number: [id number] Date of Birth/Sex: Treating RN: 1935/11/19 (88 y.o. [name] Primary Care Provider: [name] Other Clinician: Karolyn Sharper Referring Provider: Treating Provider/Extender: Marolyn Delon [name] Devra in Treatment: 6 Diagnosis Coding ICD-10 Codes Code Description N30.41 Irradiation cystitis with hematuria C61 Malignant neoplasm of prostate E11.9 Type 2 diabetes mellitus without complications Facility Procedures CPT4 Code Description Modifier Quantity [id number] [id number]-(Facility Use Only) HBOT full body chamber, , 4 ICD-10 Diagnosis Description N30.41 Irradiation cystitis with hematuria C61 Malignant neoplasm of prostate E11.9 Type 2 diabetes mellitus without complications Physician Procedures Quantity CPT4 Code Description Modifier [id number] - WC PHYS HYPERBARIC OXYGEN  THERAPY 1 ICD-10 Diagnosis Description N30.41 Irradiation cystitis with hematuria C61 Malignant neoplasm of prostate E11.9 Type 2 diabetes mellitus without complications Electronic Signature(s) Signed: 03/22/2023 3:35:02 PM By: Karolyn Sharper CHT EMT BS , , Signed: 03/22/2023 4:10:08 PM By: Marolyn Delon MD FACS Entered By: Karolyn Sharper on 03/22/2023 15:35:02

## 2023-03-23 ENCOUNTER — Encounter (HOSPITAL_BASED_OUTPATIENT_CLINIC_OR_DEPARTMENT_OTHER): Payer: Medicare Other | Admitting: General Surgery

## 2023-03-23 DIAGNOSIS — N3041 Irradiation cystitis with hematuria: Secondary | ICD-10-CM | POA: Diagnosis not present

## 2023-03-23 NOTE — Progress Notes (Signed)
 Silveira, Makoto S ([id number]) [id number] Page 1 of 2 Visit Report for 03/23/2023 Arrival Information Details Patient Name: Date of Service: TRAYDEN, BRANDY 03/23/2023 9:30 A M Medical Record Number: [id number] Patient Account Number: [id number] Date of Birth/Sex: Treating RN: 12/13/1935 (88 y.o. NETTY [place], [place] Primary Care Reagann Dolce: [name] Other Clinician: Karolyn Sharper Referring Tabytha Gradillas: Treating Remonia Otte/Extender: Marolyn Delon [name] Devra in Treatment: 6 Visit Information History Since Last Visit All ordered tests and consults were completed: Yes Patient Arrived: Vannie Added or deleted any medications: No Arrival Time: 09:16 Any new allergies or adverse reactions: No Accompanied By: self Had a fall or experienced change in No Transfer Assistance: None activities of daily living that may affect Patient Identification Verified: Yes risk of falls: Secondary Verification Process Completed: Yes Signs or symptoms of abuse/neglect since last visito No Patient Requires Transmission-Based Precautions: No Hospitalized since last visit: No Patient Has Alerts: No Implantable device outside of the clinic excluding No cellular tissue based products placed in the center since last visit: Pain Present Now: No Electronic Signature(s) Signed: 03/23/2023 11:47:41 AM By: Karolyn Sharper CHT EMT BS , , Entered By: Karolyn Sharper on 03/23/2023 11:47:41 -------------------------------------------------------------------------------- Encounter Discharge Information Details Patient Name: Date of Service: [name], Maysin S. 03/23/2023 9:30 A M Medical Record Number: [id number] Patient Account Number: [id number] Date of Birth/Sex: Treating RN: 11-24-1935 (88 y.o. NETTY [place] Primary Care Adolfo Granieri: [name] Other Clinician: Karolyn Sharper Referring Mylik Pro: Treating Parris Signer/Extender: Marolyn Delon [name] Devra in Treatment: 6 Encounter Discharge Information Items Discharge Condition: Stable Ambulatory Status: Ambulatory Discharge Destination: Home Transportation: Private Auto Accompanied By: self Schedule Follow-up Appointment: No Clinical Summary of Care: Electronic Signature(s) Signed: 03/23/2023 12:37:18 PM By: Karolyn Sharper CHT EMT BS , , Entered By: Karolyn Sharper on 03/23/2023 12:37:18 [name] ([id number]) [id number] Page 2 of 2 -------------------------------------------------------------------------------- Vitals Details Patient Name: Date of Service: BLAIZE, EPPLE 03/23/2023 9:30 A M Medical Record Number: [id number] Patient Account Number: [id number] Date of Birth/Sex: Treating RN: [birth year] (88 y.o. NETTY [place] Primary Care Aubrey Blackard: [name] Other Clinician: Karolyn Sharper Referring Romi Rathel: Treating Genell Thede/Extender: Marolyn Delon [name] Devra in Treatment: 6 Vital Signs Time Taken: 09:20 Temperature (F): 97.4 Height (in): 66 Pulse (bpm): 56 Weight (lbs): 150 Respiratory Rate (breaths/min): 18 Body Mass Index (BMI): 24.2 Blood Pressure (mmHg): 172/73 Capillary Blood Glucose (mg/dl): 803 Reference Range: 80 - 120 mg / dl Electronic Signature(s) Signed: 03/23/2023 11:48:02 AM By: Karolyn Sharper CHT EMT BS , , Entered By: Karolyn Sharper on 03/23/2023 11:48:02

## 2023-03-23 NOTE — Progress Notes (Addendum)
 Ahr, [name] S ([id number]) [id number] Page 1 of 2 Visit Report for 03/23/2023 HBO Details Patient Name: Date of Service: [name], [name] 03/23/2023 9:30 A M Medical Record Number: [id number] Patient Account Number: [id number] Date of Birth/Sex: Treating RN: 08-Apr-1935 (88 y.o. 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 HBO Treatment Details Treatment Number: 15 Patient Type: Outpatient Chamber Type: Monoplace Chamber Serial #: [id number] Treatment Protocol: 2.0 ATA with 90 minutes oxygen , and no air breaks Treatment Details Compression Rate Down: 1.0 psi / minute De-Compression Rate Up: 1.5 psi / minute Air breaks and breathing Decompress Decompress Compress Tx Pressure Begins Reached periods Begins Ends (leave unused spaces blank) Chamber Pressure (ATA 1 2 ------2 1 ) Clock Time (24 hr) 09:54 10:09 - - - - - - 11:39 11:46 Treatment Length: 112 (minutes) Treatment Segments: 4 Vital Signs Capillary Blood Glucose Reference Range: 80 - 120 mg / dl HBO Diabetic Blood Glucose Intervention Range: <131 mg/dl or >750 mg/dl Type: Time Vitals Blood Respiratory Capillary Blood Glucose Pulse Action Pulse: Temperature: Taken: Pressure: Rate: Glucose (mg/dl): Meter #: Oximetry (%) Taken: Pre 09:20 172/73 56 18 97.4 196 none per protocol Post 11:49 181/91 48 18 97.6 140 none per protocol Post 11:54 162/64 58 Treatment Response Treatment Toleration: Well Treatment Completion Status: Treatment Completed without Adverse Event Treatment Notes Mr. Landrigan arrived with vital signs that were within  normal range. Heart rate was 56. He denied symptoms of bradycardia. He stated that he ate a meal prior to arrival. After performing a safety check, patient was placed in the chamber which was compressed with 100% oxygen  at the rate of 1.5 psi/min after confirming normal ear equalization (gradual from slowest speed possible). He tolerated the treatment and subsequent decompression of the chamber at the rate of 1.5 psi/min. He denied any issues with ear equalization and/or pain associated with barotrauma. Post-treatment vital signs included systolic BP of 181 mmHg and heart rate of 48 bpm. Manual BP of 162/64 mmHg. He was stable upon discharge with his wife. Physician HBO Attestation: I certify that I supervised this HBO treatment in accordance with Medicare guidelines. A trained emergency response team is readily available per Yes hospital policies and procedures. Continue HBOT as ordered. Yes Electronic Signature(s) Signed: 03/26/2023 8:37:44 AM By: Marolyn Delon MD FACS Previous Signature: 03/23/2023 12:30:47 PM Version By: Karolyn Sharper CHT EMT BS , , [name], [name] ([id number]) [id number] Page 2 of 2 Previous Signature: 03/23/2023 12:30:47 PM Version By: Karolyn Sharper CHT EMT BS , , Entered By: Marolyn Delon on 03/26/2023 05:37:43 -------------------------------------------------------------------------------- HBO Safety Checklist Details Patient Name: Date of Service: [facility] - [name], [name] 03/23/2023 9:30 A M Medical Record Number: [id number] Patient Account Number: [id number] Date of Birth/Sex: Treating RN: [birth year] (88 y.o. [name] Primary Care Dyesha Henault: [name] Other Clinician: Karolyn Sharper Referring Adilyn Humes: Treating Rudy Luhmann/Extender: Marolyn Delon [name] Devra in Treatment: 6 HBO Safety Checklist Items Safety Checklist Consent Form Signed Patient voided / foley secured and emptied Foley When did you last eato  0815 Oatmeal, with some sugar, ginger ale Last dose of injectable or oral agent 0845 - humalog, metformin  Ostomy pouch emptied and vented if applicable NA All implantable devices assessed, documented and approved Freestyle Libre 2  and3 on approved list Intravenous access site secured and place NA Valuables secured Linens and cotton and cotton/polyester blend (less than 51% polyester) Personal oil-based products / skin lotions / body lotions removed Wigs or hairpieces removed NA Smoking or tobacco materials removed NA Books / newspapers / magazines / loose paper removed Cologne, aftershave, perfume and deodorant removed Jewelry removed (may wrap wedding band) Make-up removed NA Hair care products removed Battery operated devices (external) removed Heating patches and chemical warmers removed Titanium eyewear removed Nail polish cured greater than 10 hours NA Casting material cured greater than 10 hours NA Hearing aids removed NA Loose dentures or partials removed NA Prosthetics have been removed NA Patient demonstrates correct use of air break device (if applicable) Patient concerns have been addressed Patient grounding bracelet on and cord attached to chamber Specifics for Inpatients (complete in addition to above) Medication sheet sent with patient NA Intravenous medications needed or due during therapy sent with patient NA Drainage tubes (e.g. nasogastric tube or chest tube secured and vented) NA Endotracheal or Tracheotomy tube secured NA Cuff deflated of air and inflated with saline NA Airway suctioned NA Notes Paper version used prior to treatment start. Electronic Signature(s) Signed: 03/23/2023 12:11:38 PM By: Karolyn Sharper CHT EMT BS , , Entered By: Karolyn Sharper on 03/23/2023 09:11:38

## 2023-03-26 ENCOUNTER — Encounter (HOSPITAL_BASED_OUTPATIENT_CLINIC_OR_DEPARTMENT_OTHER): Payer: Medicare Other | Admitting: General Surgery

## 2023-03-26 DIAGNOSIS — N3041 Irradiation cystitis with hematuria: Secondary | ICD-10-CM | POA: Diagnosis not present

## 2023-03-26 LAB — GLUCOSE, CAPILLARY
Glucose-Capillary: 196 mg/dL — ABNORMAL HIGH (ref 70–99)
Glucose-Capillary: 140 mg/dL — ABNORMAL HIGH (ref 70–99)
Glucose-Capillary: 166 mg/dL — ABNORMAL HIGH (ref 70–99)
Glucose-Capillary: 138 mg/dL — ABNORMAL HIGH (ref 70–99)

## 2023-03-26 NOTE — Progress Notes (Addendum)
 [name], [name] ([id number]) [id number] Page 1 of 2 Visit Report for 03/26/2023 HBO Details Patient Name: Date of Service: [name], [name] 03/26/2023 12:30 PM Medical Record Number: [id number] Patient Account Number: [id number] Date of Birth/Sex: Treating RN: 16-Mar-1936 (88 y.o. 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 HBO Treatment Details Treatment Number: 16 Patient Type: Outpatient Chamber Type: Monoplace Chamber Serial #: [id number] Treatment Protocol: 2.0 ATA with 90 minutes oxygen , and no air breaks Treatment Details Compression Rate Down: 1.5 psi / minute De-Compression Rate Up: 2.0 psi / minute Air breaks and breathing Decompress Decompress Compress Tx Pressure Begins Reached periods Begins Ends (leave unused spaces blank) Chamber Pressure (ATA 1 2 ------2 1 ) Clock Time (24 hr) 12:58 13:16 - - - - - - 14:46 14:59 Treatment Length: 121 (minutes) Treatment Segments: 4 Vital Signs Capillary Blood Glucose Reference Range: 80 - 120 mg / dl HBO Diabetic Blood Glucose Intervention Range: <131 mg/dl or >750 mg/dl Type: Time Vitals Blood Pulse: Respiratory Temperature: Capillary Blood Glucose Pulse Action Taken: Pressure: Rate: Glucose (mg/dl): Meter #: Oximetry (%) Taken: Pre 12:27 164/65 61 18 97.3 166 none per protocol Post 15:02 170/71 57 18 98.1 138 asymptomatic for bradycardia Treatment Response Treatment Toleration: Well Treatment Completion Status: Treatment Completed without Adverse Event Treatment Notes Mr. Bjelland arrived with vital signs that were within normal  range. He stated that he ate a meal prior to arrival. After performing a safety check, patient was placed in the chamber which was compressed with 100% oxygen  at the rate of 1.5 psi/min after confirming normal ear equalization (gradual from slowest speed possible). He tolerated the treatment and subsequent decompression of the chamber at the rate of 2 psi/min. He denied any issues with ear equalization and/or pain associated with barotrauma. Post-treatment vital signs included systolic heart rate of 57 bpm. He denied symptoms of bradycardia, stating that he felt fine. He was stable upon discharge with his wife. Physician HBO Attestation: I certify that I supervised this HBO treatment in accordance with Medicare guidelines. A trained emergency response team is readily available per Yes hospital policies and procedures. Continue HBOT as ordered. Yes Electronic Signature(s) Signed: 03/27/2023 9:03:00 AM By: Marolyn Delon MD FACS Previous Signature: 03/26/2023 4:23:58 PM Version By: Karolyn Sharper CHT EMT BS , , Previous Signature: 03/26/2023 4:23:39 PM Version By: Karolyn Sharper CHT EMT BS , , Anna Maria, [name] ([id number]) [id number] Page 2 of 2 Entered By: Marolyn Delon on 03/27/2023 09:02:59 -------------------------------------------------------------------------------- HBO Safety Checklist Details Patient Name: Date of Service: [name], [name] 03/26/2023 12:30 PM Medical Record Number: [id number] Patient Account Number: [id number] Date of Birth/Sex: Treating RN: 04/29/1935 (88 y.o. [name] Primary Care Kenzi Bardwell: [name] Other Clinician: Karolyn Sharper Referring Bland Rudzinski: Treating Leondra Cullin/Extender: Marolyn Delon [name] Devra in Treatment: 6 HBO Safety Checklist Items Safety Checklist Consent Form Signed Patient voided / foley secured and emptied Foley When did you last eato 0900 Oatmeal, 1100 Sandwich Last dose of  injectable or oral agent 1045 Metformin , 0850 Humalog Ostomy pouch emptied and vented if applicable NA All implantable devices assessed, documented and approved Intravenous access site secured and place NA Valuables secured Linens and cotton and cotton/polyester blend (less than 51% polyester) Personal oil-based  products / skin lotions / body lotions removed Wigs or hairpieces removed NA Smoking or tobacco materials removed NA Books / newspapers / magazines / loose paper removed Cologne, aftershave, perfume and deodorant removed Jewelry removed (may wrap wedding band) Make-up removed NA Hair care products removed Battery operated devices (external) removed Heating patches and chemical warmers removed Titanium eyewear removed Nail polish cured greater than 10 hours NA Casting material cured greater than 10 hours NA Hearing aids removed NA Loose dentures or partials removed NA Prosthetics have been removed NA Patient demonstrates correct use of air break device (if applicable) Patient concerns have been addressed Patient grounding bracelet on and cord attached to chamber Specifics for Inpatients (complete in addition to above) Medication sheet sent with patient NA Intravenous medications needed or due during therapy sent with patient NA Drainage tubes (e.g. nasogastric tube or chest tube secured and vented) NA Endotracheal or Tracheotomy tube secured NA Cuff deflated of air and inflated with saline NA Airway suctioned NA Notes Paper version used prior to treatment start. Electronic Signature(s) Signed: 03/26/2023 4:21:23 PM By: Karolyn Sharper CHT EMT BS , , Entered By: Karolyn Sharper on 03/26/2023 16:21:23

## 2023-03-26 NOTE — Progress Notes (Signed)
 Vossler, Pavan S ([id number]) [id number] Page 1 of 2 Visit Report for 03/26/2023 Arrival Information Details Patient Name: Date of Service: [name] 03/26/2023 12:30 PM Medical Record Number: [id number] Patient Account Number: [id number] Date of Birth/Sex: Treating RN: 05/22/1935 (88 y.o. NETTY [place] Primary Care Ragnar Waas: [name] Other Clinician: Karolyn Sharper Referring Sharne Linders: Treating Nakaiya Beddow/Extender: Marolyn Delon [name] Devra in Treatment: 6 Visit Information History Since Last Visit All ordered tests and consults were completed: Yes Patient Arrived: Vannie Added or deleted any medications: No Arrival Time: 12:20 Any new allergies or adverse reactions: No Accompanied By: self Had a fall or experienced change in No Transfer Assistance: None activities of daily living that may affect Patient Identification Verified: Yes risk of falls: Secondary Verification Process Completed: Yes Signs or symptoms of abuse/neglect since last visito No Patient Requires Transmission-Based Precautions: No Hospitalized since last visit: No Patient Has Alerts: No Implantable device outside of the clinic excluding No cellular tissue based products placed in the center since last visit: Pain Present Now: No Electronic Signature(s) Signed: 03/26/2023 4:17:21 PM By: Karolyn Sharper CHT EMT BS , , Entered By: Karolyn Sharper on 03/26/2023 16:17:20 -------------------------------------------------------------------------------- Encounter Discharge Information Details Patient Name: Date of Service: [name] BRYAN, [name] 03/26/2023 12:30 PM Medical Record Number: [id number] Patient Account Number: [id number] Date of Birth/Sex: Treating RN: 12/24/1935 (88 y.o. NETTY [place] Primary Care Reva Pinkley: [name] Other Clinician: Karolyn Sharper Referring Avenly Roberge: Treating Ryiah Bellissimo/Extender: Marolyn Delon [name] Devra in Treatment: 6 Encounter Discharge Information Items Discharge Condition: Stable Ambulatory Status: Ambulatory Discharge Destination: Home Transportation: Private Auto Accompanied By: spouse Schedule Follow-up Appointment: No Clinical Summary of Care: Electronic Signature(s) Signed: 03/26/2023 4:24:34 PM By: Karolyn Sharper CHT EMT BS , , Entered By: Karolyn Sharper on 03/26/2023 16:24:34 [name] ([id number]) [id number] Page 2 of 2 -------------------------------------------------------------------------------- Vitals Details Patient Name: Date of Service: [name] 03/26/2023 12:30 PM Medical Record Number: [id number] Patient Account Number: [id number] Date of Birth/Sex: Treating RN: 15-Jul-1935 (88 y.o. NETTY [place] Primary Care Samantha Ragen: [name] Other Clinician: Karolyn Sharper Referring Danyele Smejkal: Treating Nekia Maxham/Extender: Marolyn Delon [name] Devra in Treatment: 6 Vital Signs Time Taken: 12:27 Temperature (F): 97.3 Height (in): 66 Pulse (bpm): 61 Weight (lbs): 150 Respiratory Rate (breaths/min): 18 Body Mass Index (BMI): 24.2 Blood Pressure (mmHg): 164/65 Capillary Blood Glucose (mg/dl): 833 Reference Range: 80 - 120 mg / dl Electronic Signature(s) Signed: 03/26/2023 4:19:40 PM By: Karolyn Sharper CHT EMT BS , , Entered By: Karolyn Sharper on 03/26/2023 16:19:39

## 2023-03-26 NOTE — Progress Notes (Signed)
 Whetzel, Khoury S ([id number]) [id number] Page 1 of 1 Visit Report for 03/23/2023 SuperBill Details Patient Name: Date of Service: [name], [name] 03/23/2023 Medical Record Number: [id number] Patient Account Number: [id number] Date of Birth/Sex: Treating RN: Aug 05, 1935 (88 y.o. [name] Primary Care Provider: [name] Other Clinician: Karolyn Sharper Referring Provider: Treating Provider/Extender: Marolyn Delon [name] Devra in Treatment: 6 Diagnosis Coding ICD-10 Codes Code Description N30.41 Irradiation cystitis with hematuria C61 Malignant neoplasm of prostate E11.9 Type 2 diabetes mellitus without complications Facility Procedures CPT4 Code Description Modifier Quantity 58699997 G0277-(Facility Use Only) HBOT full body chamber, , 4 ICD-10 Diagnosis Description N30.41 Irradiation cystitis with hematuria C61 Malignant neoplasm of prostate E11.9 Type 2 diabetes mellitus without complications Physician Procedures Quantity CPT4 Code Description Modifier [id number] - WC PHYS HYPERBARIC OXYGEN  THERAPY 1 ICD-10 Diagnosis Description N30.41 Irradiation cystitis with hematuria C61 Malignant neoplasm of prostate E11.9 Type 2 diabetes mellitus without complications Electronic Signature(s) Signed: 03/23/2023 12:36:45 PM By: Karolyn Sharper CHT EMT BS , , Signed: 03/26/2023 8:37:20 AM By: Marolyn Delon MD FACS Previous Signature: 03/23/2023 12:32:01 PM Version By: Karolyn Sharper CHT EMT BS , , Entered By: Karolyn Sharper on 03/23/2023 09:36:44

## 2023-03-27 ENCOUNTER — Encounter (HOSPITAL_BASED_OUTPATIENT_CLINIC_OR_DEPARTMENT_OTHER): Payer: Medicare Other | Admitting: General Surgery

## 2023-03-27 DIAGNOSIS — N3041 Irradiation cystitis with hematuria: Secondary | ICD-10-CM | POA: Diagnosis not present

## 2023-03-27 LAB — GLUCOSE, CAPILLARY
Glucose-Capillary: 150 mg/dL — ABNORMAL HIGH (ref 70–99)
Glucose-Capillary: 130 mg/dL — ABNORMAL HIGH (ref 70–99)

## 2023-03-27 NOTE — Progress Notes (Signed)
 Derusha, Daymond S ([id number]) [id number] Page 1 of 2 Visit Report for 03/27/2023 Arrival Information Details Patient Name: Date of Service: [name], [name] 03/27/2023 12:30 PM Medical Record Number: [id number] Patient Account Number: [id number] Date of Birth/Sex: Treating RN: [birth year] (88 y.o. [name] Primary Care Casey Fye: [name] Other Clinician: Karolyn Sharper Referring Braylan Faul: Treating Shamell Suarez/Extender: Marolyn Delon [name] Devra in Treatment: 6 Visit Information History Since Last Visit All ordered tests and consults were completed: Yes Patient Arrived: Vannie Added or deleted any medications: No Arrival Time: 12:18 Any new allergies or adverse reactions: No Accompanied By: self Had a fall or experienced change in No Transfer Assistance: None activities of daily living that may affect Patient Identification Verified: Yes risk of falls: Secondary Verification Process Completed: Yes Signs or symptoms of abuse/neglect since last visito No Patient Requires Transmission-Based Precautions: No Hospitalized since last visit: No Patient Has Alerts: No Implantable device outside of the clinic excluding No cellular tissue based products placed in the center since last visit: Pain Present Now: No Electronic Signature(s) Signed: 03/27/2023 3:57:16 PM By: Karolyn Sharper CHT EMT BS , , Entered By: Karolyn Sharper on 03/27/2023 15:57:16 -------------------------------------------------------------------------------- Encounter Discharge Information Details Patient Name: Date of Service: [name], [name] 03/27/2023 12:30 PM Medical Record Number: [id number] Patient Account Number: [id number] Date of Birth/Sex: Treating RN: [birth year] (88 y.o. [name] Primary Care Dontrelle Mazon: [name] Other Clinician: Karolyn Sharper Referring Dujuan Stankowski: Treating Brylei Pedley/Extender: Marolyn Delon [name] Devra in Treatment: 6 Encounter Discharge Information Items Discharge Condition: Stable Ambulatory Status: Walker Discharge Destination: Home Transportation: Private Auto Accompanied By: spouse Schedule Follow-up Appointment: No Clinical Summary of Care: Electronic Signature(s) Signed: 03/27/2023 4:12:29 PM By: Karolyn Sharper CHT EMT BS , , Entered By: Karolyn Sharper on 03/27/2023 16:12:29 [name] ([id number]) [id number] Page 2 of 2 -------------------------------------------------------------------------------- Vitals Details Patient Name: Date of Service: [name], [name] 03/27/2023 12:30 PM Medical Record Number: [id number] Patient Account Number: [id number] Date of Birth/Sex: Treating RN: [birth year] (88 y.o. [name] Primary Care Leilah Polimeni: [name] Other Clinician: Karolyn Sharper Referring Sherree Shankman: Treating Alena Blankenbeckler/Extender: Marolyn Delon [name] Devra in Treatment: 6 Vital Signs Time Taken: 12:32 Temperature (F): 97.7 Height (in): 66 Pulse (bpm): 62 Weight (lbs): 150 Respiratory Rate (breaths/min): 18 Body Mass Index (BMI): 24.2 Blood Pressure (mmHg): 144/59 Capillary Blood Glucose (mg/dl): 849 Reference Range: 80 - 120 mg / dl Electronic Signature(s) Signed: 03/27/2023 3:58:23 PM By: Karolyn Sharper CHT EMT BS , , Entered By: Karolyn Sharper on 03/27/2023 15:58:23

## 2023-03-27 NOTE — Progress Notes (Signed)
 Sisney, Hayato S ([id number]) [id number] Page 1 of 1 Visit Report for 03/27/2023 SuperBill Details Patient Name: Date of Service: [name], [name] 03/27/2023 Medical Record Number: [id number] Patient Account Number: [id number] Date of Birth/Sex: Treating RN: 08-19-1935 (88 y.o. [name] Primary Care Provider: [name] Other Clinician: Karolyn Sharper Referring Provider: Treating Provider/Extender: Marolyn Delon [name] Devra in Treatment: 6 Diagnosis Coding ICD-10 Codes Code Description N30.41 Irradiation cystitis with hematuria C61 Malignant neoplasm of prostate E11.9 Type 2 diabetes mellitus without complications Facility Procedures CPT4 Code Description Modifier Quantity 58699997 G0277-(Facility Use Only) HBOT full body chamber, , 4 ICD-10 Diagnosis Description N30.41 Irradiation cystitis with hematuria C61 Malignant neoplasm of prostate E11.9 Type 2 diabetes mellitus without complications Physician Procedures Quantity CPT4 Code Description Modifier [id number] - WC PHYS HYPERBARIC OXYGEN  THERAPY 1 ICD-10 Diagnosis Description N30.41 Irradiation cystitis with hematuria C61 Malignant neoplasm of prostate E11.9 Type 2 diabetes mellitus without complications Electronic Signature(s) Signed: 03/27/2023 4:03:13 PM By: Karolyn Sharper CHT EMT BS , , Signed: 03/27/2023 4:26:58 PM By: Marolyn Delon MD FACS Entered By: Karolyn Sharper on 03/27/2023 13:03:12

## 2023-03-27 NOTE — Progress Notes (Signed)
 Baldo, Quinto S ([id number]) [id number] Page 1 of 1 Visit Report for 03/26/2023 SuperBill Details Patient Name: Date of Service: [name], [name] 03/26/2023 Medical Record Number: [id number] Patient Account Number: [id number] Date of Birth/Sex: Treating RN: [birth year] (88 y.o. [name] Primary Care Provider: [name] Other Clinician: Karolyn Sharper Referring Provider: Treating Provider/Extender: Marolyn Delon [name] Devra in Treatment: 6 Diagnosis Coding ICD-10 Codes Code Description N30.41 Irradiation cystitis with hematuria C61 Malignant neoplasm of prostate E11.9 Type 2 diabetes mellitus without complications Facility Procedures CPT4 Code Description Modifier Quantity 58699997 G0277-(Facility Use Only) HBOT full body chamber, , 4 ICD-10 Diagnosis Description N30.41 Irradiation cystitis with hematuria C61 Malignant neoplasm of prostate E11.9 Type 2 diabetes mellitus without complications Physician Procedures Quantity CPT4 Code Description Modifier [id number] - WC PHYS HYPERBARIC OXYGEN  THERAPY 1 ICD-10 Diagnosis Description N30.41 Irradiation cystitis with hematuria C61 Malignant neoplasm of prostate E11.9 Type 2 diabetes mellitus without complications Electronic Signature(s) Signed: 03/26/2023 4:24:11 PM By: Karolyn Sharper CHT EMT BS , , Signed: 03/27/2023 9:04:02 AM By: Marolyn Delon MD FACS Entered By: Karolyn Sharper on 03/26/2023 16:24:11

## 2023-03-27 NOTE — Progress Notes (Signed)
 [name] ([id number]) [id number] Page 1 of 2 Visit Report for 03/27/2023 HBO Details Patient Name: Date of Service: [name], [name] 03/27/2023 12:30 PM Medical Record Number: [id number] Patient Account Number: [id number] Date of Birth/Sex: Treating RN: 1935/05/01 (88 y.o. 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 HBO [name] Details [name] Number: 17 Patient Type: Outpatient Chamber Type: Monoplace Chamber Serial #: [id number] [name] Protocol: 2.0 ATA with 90 minutes oxygen , and no air breaks [name] Details Compression Rate Down: 1.5 psi / minute De-Compression Rate Up: 2.0 psi / minute Air breaks and breathing Decompress Decompress Compress Tx Pressure Begins Reached periods Begins Ends (leave unused spaces blank) Chamber Pressure (ATA 1 2 ------2 1 ) Clock Time (24 hr) 13:00 13:12 - - - - - - 14:42 14:51 [name] Length: 111 (minutes) [name] Segments: 4 Vital Signs Capillary Blood Glucose Reference Range: 80 - 120 mg / dl HBO Diabetic Blood Glucose Intervention Range: <131 mg/dl or >750 mg/dl Type: Time Vitals Blood Respiratory Capillary Blood Glucose Pulse Action Pulse: Temperature: Taken: Pressure: Rate: Glucose (mg/dl): Meter #: Oximetry (%) Taken: Pre 12:32 144/59 62 18 97.7 150 none per protocol Post 14:54 155/73 62 18 97.5 130 none per protocol [name] Response [name] Toleration: Well [name] Completion Status: [name] Completed without Adverse Event [name] Notes Mr. Walmsley arrived with vital signs that were within normal range except  diastolic BP of 59 mmHg. He denied symptoms for hypotension. He stated that he ate a meal prior to arrival. After performing a safety check, patient was placed in the chamber which was compressed with 100% oxygen  at the rate of 1.5 psi/min after confirming normal ear equalization. He tolerated the [name] and subsequent decompression of the chamber at the rate of 2 psi/min. He denied any issues with ear equalization and/or pain associated with barotrauma. Post-[name] vital signs were within normal range. He was stable upon discharge with his wife. Physician HBO Attestation: I certify that I supervised this HBO [name] in accordance with Medicare guidelines. A trained emergency response team is readily available per Yes hospital policies and procedures. Continue HBOT as ordered. Yes Electronic Signature([name]) Signed: 03/27/2023 4:27:44 PM By: [name] Delon MD FACS Previous Signature: 03/27/2023 4:02:51 PM Version By: [name] CHT EMT BS , , Entered By: [name] on 03/27/2023 16:27:43 Blanck, [name] ([id number]) [id number] Page 2 of 2 -------------------------------------------------------------------------------- HBO Safety Checklist Details Patient Name: Date of Service: [name], [name] 03/27/2023 12:30 PM Medical Record Number: [id number] Patient Account Number: [id number] Date of Birth/Sex: Treating RN: Feb 12, 1936 (88 y.o. [name] Primary Care [name]: [name] Other Clinician: Karolyn [name] Referring [name]: Treating [name]/Extender: [name] [name] [name]: 6 HBO Safety Checklist Items Safety Checklist Consent Form Signed Patient voided / foley secured and emptied Foley 0900Breakfast oatmeal, 1100 Bologna Onion When did you last eato Chz sandwich Last dose of injectable or oral agent 1030 Ostomy pouch emptied and vented if applicable NA All implantable devices assessed,  documented and approved Freestyle Libre 2 and3 on approved list Intravenous access site secured and place NA Valuables secured Linens and cotton and cotton/polyester blend (less than 51% polyester) Personal oil-based products / skin lotions / body lotions removed Wigs or hairpieces removed NA Smoking  or tobacco materials removed NA Books / newspapers / magazines / loose paper removed Cologne, aftershave, perfume and deodorant removed Jewelry removed (may wrap wedding band) Make-up removed NA Hair care products removed NA Battery operated devices (external) removed Heating patches and chemical warmers removed Titanium eyewear removed Nail polish cured greater than 10 hours NA Casting material cured greater than 10 hours NA Hearing aids removed NA Loose dentures or partials removed NA Prosthetics have been removed NA Patient demonstrates correct use of air break device (if applicable) Patient concerns have been addressed Patient grounding bracelet on and cord attached to chamber Specifics for Inpatients (complete in addition to above) Medication sheet sent with patient NA Intravenous medications needed or due during therapy sent with patient NA Drainage tubes (e.g. nasogastric tube or chest tube secured and vented) NA Endotracheal or Tracheotomy tube secured NA Cuff deflated of air and inflated with saline NA Airway suctioned NA Notes Paper version used prior to [name] start. Electronic Signature([name]) Signed: 03/27/2023 4:00:05 PM By: [name] CHT EMT BS , , Entered By: [name] on 03/27/2023 16:00:05

## 2023-03-28 ENCOUNTER — Encounter (HOSPITAL_BASED_OUTPATIENT_CLINIC_OR_DEPARTMENT_OTHER): Payer: Medicare Other | Admitting: General Surgery

## 2023-03-28 DIAGNOSIS — N3041 Irradiation cystitis with hematuria: Secondary | ICD-10-CM | POA: Diagnosis not present

## 2023-03-28 LAB — GLUCOSE, CAPILLARY
Glucose-Capillary: 133 mg/dL — ABNORMAL HIGH (ref 70–99)
Glucose-Capillary: 105 mg/dL — ABNORMAL HIGH (ref 70–99)

## 2023-03-28 NOTE — Progress Notes (Signed)
 Sthilaire, Bethel S ([id number]) [id number] Page 1 of 2 Visit Report for 03/28/2023 Arrival Information Details Patient Name: Date of Service: [name], [name] 03/28/2023 12:30 PM Medical Record Number: [id number] Patient Account Number: [id number] Date of Birth/Sex: Treating RN: [birth year] (88 y.o. [name], Geminus.gell Primary Care Terryann Verbeek: [name] Other Clinician: Karolyn Sharper Referring Cady Hafen: Treating Sailor Hevia/Extender: Marolyn Delon [name] Devra in Treatment: 7 Visit Information History Since Last Visit All ordered tests and consults were completed: Yes Patient Arrived: Vannie Added or deleted any medications: No Arrival Time: 12:28 Any new allergies or adverse reactions: No Accompanied By: spouse Had a fall or experienced change in No Transfer Assistance: None activities of daily living that may affect Patient Identification Verified: Yes risk of falls: Secondary Verification Process Completed: Yes Signs or symptoms of abuse/neglect since last visito No Patient Requires Transmission-Based Precautions: No Hospitalized since last visit: No Patient Has Alerts: No Implantable device outside of the clinic excluding No cellular tissue based products placed in the center since last visit: Pain Present Now: No Electronic Signature(s) Signed: 03/28/2023 1:30:06 PM By: Karolyn Sharper CHT EMT BS , , Entered By: Karolyn Sharper on 03/28/2023 13:30:06 -------------------------------------------------------------------------------- Encounter Discharge Information Details Patient Name: Date of Service: [name], [name] 03/28/2023 12:30 PM Medical Record Number: [id number] Patient Account Number: [id number] Date of Birth/Sex: Treating RN: 12/06/1935 (88 y.o. [name] Nestle Primary Care Lafe Clerk: [name] Other Clinician: Karolyn Sharper Referring Raliegh Scobie: Treating Koltan Portocarrero/Extender: Marolyn Delon [name] Devra  in Treatment: 7 Encounter Discharge Information Items Discharge Condition: Stable Ambulatory Status: Walker Discharge Destination: Home Transportation: Private Auto Accompanied By: spouse Schedule Follow-up Appointment: No Clinical Summary of Care: Electronic Signature(s) Signed: 03/28/2023 3:58:10 PM By: Karolyn Sharper CHT EMT BS , , Entered By: Karolyn Sharper on 03/28/2023 15:58:10 [name] ([id number]) [id number] Page 2 of 2 -------------------------------------------------------------------------------- Vitals Details Patient Name: Date of Service: [name], [name] 03/28/2023 12:30 PM Medical Record Number: [id number] Patient Account Number: [id number] Date of Birth/Sex: Treating RN: 1935-05-20 (88 y.o. [name] Nestle Primary Care Reeve Turnley: [name] Other Clinician: Karolyn Sharper Referring Karin Pinedo: Treating Kristan Votta/Extender: Marolyn Delon [name] Devra in Treatment: 7 Vital Signs Time Taken: 12:48 Temperature (F): 97.9 Height (in): 66 Pulse (bpm): 64 Weight (lbs): 150 Respiratory Rate (breaths/min): 18 Body Mass Index (BMI): 24.2 Blood Pressure (mmHg): 132/56 Capillary Blood Glucose (mg/dl): 866 Reference Range: 80 - 120 mg / dl Electronic Signature(s) Signed: 03/28/2023 1:30:40 PM By: Karolyn Sharper CHT EMT BS , , Entered By: Karolyn Sharper on 03/28/2023 13:30:39

## 2023-03-29 ENCOUNTER — Encounter (HOSPITAL_BASED_OUTPATIENT_CLINIC_OR_DEPARTMENT_OTHER): Payer: Medicare Other | Admitting: General Surgery

## 2023-03-29 DIAGNOSIS — N3041 Irradiation cystitis with hematuria: Secondary | ICD-10-CM | POA: Diagnosis not present

## 2023-03-29 LAB — GLUCOSE, CAPILLARY
Glucose-Capillary: 140 mg/dL — ABNORMAL HIGH (ref 70–99)
Glucose-Capillary: 125 mg/dL — ABNORMAL HIGH (ref 70–99)

## 2023-03-29 NOTE — Progress Notes (Signed)
 Janes, Chadd S ([id number]) [id number] Page 1 of 1 Visit Report for 03/28/2023 SuperBill Details Patient Name: Date of Service: [name], [name] 03/28/2023 Medical Record Number: [id number] Patient Account Number: [id number] Date of Birth/Sex: Treating RN: 16-Dec-1935 (88 y.o. [name] Primary Care Provider: [name] Other Clinician: Karolyn Sharper Referring Provider: Treating Provider/Extender: Marolyn Delon [name] Devra in Treatment: 7 Diagnosis Coding ICD-10 Codes Code Description N30.41 Irradiation cystitis with hematuria C61 Malignant neoplasm of prostate E11.9 Type 2 diabetes mellitus without complications Facility Procedures CPT4 Code Description Modifier Quantity 58699997 G0277-(Facility Use Only) HBOT full body chamber, , 4 ICD-10 Diagnosis Description N30.41 Irradiation cystitis with hematuria C61 Malignant neoplasm of prostate E11.9 Type 2 diabetes mellitus without complications Physician Procedures Quantity CPT4 Code Description Modifier [id number] - WC PHYS HYPERBARIC OXYGEN  THERAPY 1 ICD-10 Diagnosis Description N30.41 Irradiation cystitis with hematuria C61 Malignant neoplasm of prostate E11.9 Type 2 diabetes mellitus without complications Electronic Signature(s) Signed: 03/28/2023 3:56:34 PM By: Karolyn Sharper CHT EMT BS , , Signed: 03/28/2023 4:46:54 PM By: Marolyn Delon MD FACS Entered By: Karolyn Sharper on 03/28/2023 15:56:34

## 2023-03-29 NOTE — Progress Notes (Signed)
 Dorsch, Manford S ([id number]) [id number] Page 1 of 2 Visit Report for 03/28/2023 HBO Details Patient Name: Date of Service: [name], [name] 03/28/2023 12:30 PM Medical Record Number: [id number] Patient Account Number: [id number] Date of Birth/Sex: Treating RN: 15-Aug-1935 (88 y.o. 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 HBO Treatment Details Treatment Number: 18 Patient Type: Outpatient Chamber Type: Monoplace Chamber Serial #: [id number] Treatment Protocol: 2.0 ATA with 90 minutes oxygen , and no air breaks Treatment Details Compression Rate Down: 1.0 psi / minute De-Compression Rate Up: 2.0 psi / minute Air breaks and breathing Decompress Decompress Compress Tx Pressure Begins Reached periods Begins Ends (leave unused spaces blank) Chamber Pressure (ATA 1 2 ------2 1 ) Clock Time (24 hr) 12:57 13:12 - - - - - - 14:42 14:51 Treatment Length: 114 (minutes) Treatment Segments: 4 Vital Signs Capillary Blood Glucose Reference Range: 80 - 120 mg / dl HBO Diabetic Blood Glucose Intervention Range: <131 mg/dl or >750 mg/dl Type: Time Vitals Blood Pulse: Respiratory Temperature: Capillary Blood Glucose Pulse Action Taken: Pressure: Rate: Glucose (mg/dl): Meter #: Oximetry (%) Taken: Pre 12:48 132/56 64 18 97.9 133 asymptomatic for hypotension Post 14:55 159/60 56 18 97.3 105 asymptomatic for bradycardia Treatment Response Treatment Toleration: Well Treatment Completion Status: Treatment Completed without Adverse Event Treatment Notes Mr. Baccam arrived with vital signs that were within  normal range except diastolic BP of 56 mmHg. He denied symptoms for hypotension. He stated that he did ate a meal prior to arrival and content of meal was appropriate for treatment. After performing a safety check, patient was placed in the chamber which was compressed with 100% oxygen  at the rate of 1.5 psi/min after confirming normal ear equalization. He tolerated the treatment and subsequent decompression of the chamber at the rate of 2 psi/min. He denied any issues with ear equalization and/or pain associated with barotrauma. Post-treatment vital signs were within normal range except heart rate of 56 bpm. He was stable upon discharge with his wife. Physician HBO Attestation: I certify that I supervised this HBO treatment in accordance with Medicare guidelines. A trained emergency response team is readily available per Yes hospital policies and procedures. Continue HBOT as ordered. Yes Electronic Signature(s) Signed: 03/28/2023 4:47:16 PM By: Marolyn Delon MD FACS Previous Signature: 03/28/2023 3:56:20 PM Version By: Karolyn Sharper CHT EMT BS , , Entered By: Marolyn Delon on 03/28/2023 16:47:16 Utt, Maleek S ([id number]) [id number] Page 2 of 2 -------------------------------------------------------------------------------- HBO Safety Checklist Details Patient Name: Date of Service: [name], [name] 03/28/2023 12:30 PM Medical Record Number: [id number] Patient Account Number: [id number] Date of Birth/Sex: Treating RN: 1936-03-08 (88 y.o. [name] Nestle Primary Care Nikitas Davtyan: [name] Other Clinician: Karolyn Sharper Referring Aamina Skiff: Treating Severo Beber/Extender: Marolyn Delon [name] Devra in Treatment: 7 HBO Safety Checklist Items Safety Checklist Consent Form Signed Patient voided / foley secured and emptied Foley 0830 - oatmeal, 1130 Bologna and Cheese with When did you last eato Onion Sandwich Last dose of injectable or oral  agent 0820 - Humalog. Ostomy pouch emptied and vented if applicable NA All implantable devices assessed, documented and approved Freestyle Libre 2 and3 on approved list Intravenous access site secured and place NA Valuables secured Linens and cotton and cotton/polyester blend (  less than 51% polyester) Personal oil-based products / skin lotions / body lotions removed Wigs or hairpieces removed NA Smoking or tobacco materials removed NA Books / newspapers / magazines / loose paper removed Cologne, aftershave, perfume and deodorant removed Jewelry removed (may wrap wedding band) Make-up removed NA Hair care products removed Battery operated devices (external) removed Heating patches and chemical warmers removed Titanium eyewear removed Nail polish cured greater than 10 hours NA Casting material cured greater than 10 hours NA Hearing aids removed NA Loose dentures or partials removed NA Prosthetics have been removed NA Patient demonstrates correct use of air break device (if applicable) Patient concerns have been addressed Patient grounding bracelet on and cord attached to chamber Specifics for Inpatients (complete in addition to above) Medication sheet sent with patient NA Intravenous medications needed or due during therapy sent with patient NA Drainage tubes (e.g. nasogastric tube or chest tube secured and vented) NA Endotracheal or Tracheotomy tube secured NA Cuff deflated of air and inflated with saline NA Airway suctioned NA Notes Paper version used prior to treatment start. Electronic Signature(s) Signed: 03/28/2023 2:46:02 PM By: Karolyn Sharper CHT EMT BS , , Entered By: Karolyn Sharper on 03/28/2023 14:46:02

## 2023-03-30 ENCOUNTER — Encounter (HOSPITAL_BASED_OUTPATIENT_CLINIC_OR_DEPARTMENT_OTHER): Payer: Medicare Other | Admitting: General Surgery

## 2023-03-30 DIAGNOSIS — N3041 Irradiation cystitis with hematuria: Secondary | ICD-10-CM | POA: Diagnosis not present

## 2023-03-30 LAB — GLUCOSE, CAPILLARY
Glucose-Capillary: 195 mg/dL — ABNORMAL HIGH (ref 70–99)
Glucose-Capillary: 119 mg/dL — ABNORMAL HIGH (ref 70–99)

## 2023-03-30 NOTE — Progress Notes (Signed)
 [name], [name] S ([id number]) [id number] Page 1 of 2 Visit Report for 03/30/2023 Arrival Information Details Patient Name: Date of Service: [name], [name] 03/30/2023 9:30 A M Medical Record Number: [id number] Patient Account Number: [id number] Date of Birth/Sex: Treating RN: [name] (88 y.o. [name], [name] Primary Care [name]: [name] Other Clinician: Karolyn [name] Referring [name]: Treating [name]/Extender: [name] [name] [name] in Treatment: 7 Visit Information History Since Last Visit All ordered tests and consults were completed: Yes Patient Arrived: [name] Added or deleted any medications: No Arrival Time: 09:16 Any new allergies or adverse reactions: No Accompanied By: self Had a fall or experienced change in No Transfer Assistance: None activities of daily living that may affect Patient Identification Verified: Yes risk of falls: Secondary Verification Process Completed: Yes Signs or symptoms of abuse/neglect since last visito No Patient Requires Transmission-Based Precautions: No Hospitalized since last visit: No Patient Has Alerts: No Implantable device outside of the clinic excluding No cellular tissue based products placed in the center since last visit: Pain Present Now: No Electronic Signature(s) Signed: 03/30/2023 2:21:35 PM By: [name] CHT EMT BS , , Entered By: [name] on 03/30/2023 14:21:35 -------------------------------------------------------------------------------- Encounter Discharge Information Details Patient Name: Date of Service: [name], [name] 03/30/2023 9:30 A M Medical Record Number: [id number] Patient Account Number: [id number] Date of Birth/Sex: Treating RN: [name] (88 y.o. [name] Nestle Primary Care Zaneta Lightcap: [name] Other Clinician: Karolyn [name] Referring Kaleeyah Cuffie: Treating [name] Kreeger/Extender: [name] [name] [name] in Treatment: 7 Encounter Discharge Information Items Discharge Condition: Stable Ambulatory Status: Walker Discharge Destination: Home Transportation: Private Auto Accompanied By: spouse Schedule Follow-up Appointment: No Clinical Summary of Care: Electronic Signature(s) Signed: 03/30/2023 2:26:52 PM By: [name] CHT EMT BS , , Entered By: [name] on 03/30/2023 14:26:52 [name] ([id number]) [id number] Page 2 of 2 -------------------------------------------------------------------------------- Vitals Details Patient Name: Date of Service: [name] 03/30/2023 9:30 A M Medical Record Number: [id number] Patient Account Number: [id number] Date of Birth/Sex: Treating RN: [name] (88 y.o. [name] Nestle Primary Care Vance Belcourt: [name] Other Clinician: Karolyn [name] Referring Arvilla Salada: Treating Bueford Arp/Extender: [name] [name] [name] in Treatment: 7 Vital Signs Time Taken: 09:45 Temperature (F): 98.2 Height (in): 66 Pulse (bpm): 57 Weight (lbs): 150 Respiratory Rate (breaths/min): 18 Body Mass Index (BMI): 24.2 Blood Pressure (mmHg): 175/61 Capillary Blood Glucose (mg/dl): 804 Reference Range: 80 - 120 mg / dl Electronic Signature(s) Signed: 03/30/2023 2:22:09 PM By: [name] CHT EMT BS , , Entered By: [name] on 03/30/2023 14:22:09

## 2023-03-30 NOTE — Progress Notes (Addendum)
 [name], [name] ([id number]) [id number] Page 1 of 2 Visit Report for 03/30/2023 HBO Details Patient Name: Date of Service: [name], [name] 03/30/2023 9:30 A M Medical Record Number: [id number] Patient Account Number: [id number] Date of Birth/Sex: Treating RN: 22-Sep-1935 (88 y.o. 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 HBO Treatment Details Treatment Number: 20 Patient Type: Outpatient Chamber Type: Monoplace Chamber Serial #: [id number] Treatment Protocol: 2.0 ATA with 90 minutes oxygen , and no air breaks Treatment Details Compression Rate Down: 1.5 psi / minute De-Compression Rate Up: 2.0 psi / minute Air breaks and breathing Decompress Decompress Compress Tx Pressure Begins Reached periods Begins Ends (leave unused spaces blank) Chamber Pressure (ATA 1 2 ------2 1 ) Clock Time (24 hr) 10:21 10:33 - - - - - - 12:03 12:15 Treatment Length: 114 (minutes) Treatment Segments: 4 Vital Signs Capillary Blood Glucose Reference Range: 80 - 120 mg / dl HBO Diabetic Blood Glucose Intervention Range: <131 mg/dl or >750 mg/dl Type: Time Vitals Blood Respiratory Capillary Blood Glucose Pulse Action Pulse: Temperature: Taken: Pressure: Rate: Glucose (mg/dl): Meter #: Oximetry (%) Taken: Pre 09:45 175/61 57 18 98.2 195 none per protocol Post 12:18 185/76 59 18 97.6 119 none per protocol Treatment Response Treatment Toleration: Well Treatment Completion Status: Treatment Completed without Adverse Event Treatment Notes Mr. [name] arrived with vital signs that included heart rate of 57 bpm. He  denied symptoms for bradycardia. He stated that he did eat a meal prior to arrival and content of meal was appropriate for treatment. After performing a safety check, patient was placed in the chamber which was compressed with 100% oxygen  at the rate of 2 psi/min after confirming normal ear equalization. He tolerated the treatment and subsequent decompression of the chamber at the rate of 2 psi/min. He denied any issues with ear equalization and/or pain associated with barotrauma. Post-treatment vital signs were within normal range except heart rate of 59 bpm. Again, he denied symptoms of bradycardia. He was stable upon discharge with his wife. Physician HBO Attestation: I certify that I supervised this HBO treatment in accordance with Medicare guidelines. A trained emergency response team is readily available per Yes hospital policies and procedures. Continue HBOT as ordered. Yes Electronic Signature([name]) Signed: 04/02/2023 7:46:33 AM By: Marolyn Delon MD FACS Previous Signature: 03/30/2023 2:26:16 PM Version By: Karolyn Sharper CHT EMT BS , , Entered By: Marolyn Delon on 04/02/2023 07:46:33 [name], [name] ([id number]) [id number] Page 2 of 2 -------------------------------------------------------------------------------- HBO Safety Checklist Details Patient Name: Date of Service: [name], [name] 03/30/2023 9:30 A M Medical Record Number: [id number] Patient Account Number: [id number] Date of Birth/Sex: Treating RN: 1935/10/28 (89 y.o. [name] Nestle Primary Care [name]: [name] Other Clinician: Karolyn Sharper Referring [name]: Treating [name]/Extender: Marolyn Delon [name] [name] in Treatment: 7 HBO Safety Checklist Items Safety Checklist Consent Form Signed Patient voided / foley secured and emptied When did you last eato 0830 Chili Beans and coffee Last dose of injectable or oral agent 0815 Ostomy pouch emptied and vented  if applicable NA All implantable devices assessed, documented and approved Freestyle Libre 2 and3 on approved list Intravenous access site secured and place NA Valuables secured Linens and cotton and cotton/polyester blend (less than 51% polyester)  Personal oil-based products / skin lotions / body lotions removed Wigs or hairpieces removed NA Smoking or tobacco materials removed NA Books / newspapers / magazines / loose paper removed Cologne, aftershave, perfume and deodorant removed Jewelry removed (may wrap wedding band) Make-up removed NA Hair care products removed Battery operated devices (external) removed Heating patches and chemical warmers removed Titanium eyewear removed Nail polish cured greater than 10 hours NA Casting material cured greater than 10 hours NA Hearing aids removed NA Loose dentures or partials removed NA Prosthetics have been removed NA Patient demonstrates correct use of air break device (if applicable) Patient concerns have been addressed Patient grounding bracelet on and cord attached to chamber Specifics for Inpatients (complete in addition to above) Medication sheet sent with patient NA Intravenous medications needed or due during therapy sent with patient NA Drainage tubes (e.g. nasogastric tube or chest tube secured and vented) NA Endotracheal or Tracheotomy tube secured NA Cuff deflated of air and inflated with saline NA Airway suctioned NA Notes Paper version used prior to treatment start. Electronic Signature([name]) Signed: 03/30/2023 2:23:50 PM By: Karolyn Sharper CHT EMT BS , , Entered By: Karolyn Sharper on 03/30/2023 14:23:50

## 2023-03-30 NOTE — Progress Notes (Signed)
 Rami, Kaz S ([id number]) [id number] Page 1 of 1 Visit Report for 03/29/2023 SuperBill Details Patient Name: Date of Service: [name], [name] 03/29/2023 Medical Record Number: [id number] Patient Account Number: [id number] Date of Birth/Sex: Treating RN: 06/18/1935 (88 y.o. [name] Primary Care Provider: [name] Other Clinician: Karolyn Sharper Referring Provider: Treating Provider/Extender: Marolyn Delon [name] Devra in Treatment: 7 Diagnosis Coding ICD-10 Codes Code Description N30.41 Irradiation cystitis with hematuria C61 Malignant neoplasm of prostate E11.9 Type 2 diabetes mellitus without complications Facility Procedures CPT4 Code Description Modifier Quantity 58699997 G0277-(Facility Use Only) HBOT full body chamber, , 4 ICD-10 Diagnosis Description N30.41 Irradiation cystitis with hematuria C61 Malignant neoplasm of prostate E11.9 Type 2 diabetes mellitus without complications Physician Procedures Quantity CPT4 Code Description Modifier [id number] - WC PHYS HYPERBARIC OXYGEN  THERAPY 1 ICD-10 Diagnosis Description N30.41 Irradiation cystitis with hematuria C61 Malignant neoplasm of prostate E11.9 Type 2 diabetes mellitus without complications Electronic Signature(s) Signed: 03/29/2023 6:30:58 PM By: Karolyn Sharper CHT EMT BS , , Signed: 03/30/2023 8:14:31 AM By: Marolyn Delon MD FACS Entered By: Karolyn Sharper on 03/29/2023 18:30:58

## 2023-03-30 NOTE — Progress Notes (Signed)
 Rudell, Jeferson S ([id number]) [id number] Page 1 of 2 Visit Report for 03/29/2023 Arrival Information Details Patient Name: Date of Service: KARSTEN, HOWRY 03/29/2023 12:30 PM Medical Record Number: [id number] Patient Account Number: [id number] Date of Birth/Sex: Treating RN: 05/10/1935 (88 y.o. [name], Geminus.gell Primary Care Alona Danford: [name] Other Clinician: Karolyn Sharper Referring Johnie Stadel: Treating Jahmere Bramel/Extender: Marolyn Delon [name] Devra in Treatment: 7 Visit Information History Since Last Visit All ordered tests and consults were completed: Yes Patient Arrived: Vannie Added or deleted any medications: No Arrival Time: 12:18 Any new allergies or adverse reactions: No Accompanied By: self Had a fall or experienced change in No Transfer Assistance: None activities of daily living that may affect Patient Identification Verified: Yes risk of falls: Secondary Verification Process Completed: Yes Signs or symptoms of abuse/neglect since last visito No Patient Requires Transmission-Based Precautions: No Hospitalized since last visit: No Patient Has Alerts: No Implantable device outside of the clinic excluding No cellular tissue based products placed in the center since last visit: Pain Present Now: No Electronic Signature(s) Signed: 03/29/2023 6:21:04 PM By: Karolyn Sharper CHT EMT BS , , Entered By: Karolyn Sharper on 03/29/2023 15:21:04 -------------------------------------------------------------------------------- Encounter Discharge Information Details Patient Name: Date of Service: [name], Kahlen S. 03/29/2023 12:30 PM Medical Record Number: [id number] Patient Account Number: [id number] Date of Birth/Sex: Treating RN: Apr 30, 1935 (88 y.o. [name] Nestle Primary Care Rajveer Handler: [name] Other Clinician: Karolyn Sharper Referring Alvenia Treese: Treating Laurence Folz/Extender: Marolyn Delon [name] Devra in  Treatment: 7 Encounter Discharge Information Items Discharge Condition: Stable Ambulatory Status: Ambulatory Discharge Destination: Home Transportation: Private Auto Accompanied By: spouse Schedule Follow-up Appointment: No Clinical Summary of Care: Electronic Signature(s) Signed: 03/29/2023 6:31:28 PM By: Karolyn Sharper CHT EMT BS , , Entered By: Karolyn Sharper on 03/29/2023 15:31:28 [name] ([id number]) [id number] Page 2 of 2 -------------------------------------------------------------------------------- Vitals Details Patient Name: Date of Service: ALVEY, BROCKEL 03/29/2023 12:30 PM Medical Record Number: [id number] Patient Account Number: [id number] Date of Birth/Sex: Treating RN: June 09, 1935 (88 y.o. [name] Nestle Primary Care Caitrin Pendergraph: [name] Other Clinician: Karolyn Sharper Referring Sriansh Farra: Treating Sarvesh Meddaugh/Extender: Marolyn Delon [name] Devra in Treatment: 7 Vital Signs Time Taken: 12:18 Temperature (F): 97.0 Height (in): 66 Pulse (bpm): 55 Weight (lbs): 150 Respiratory Rate (breaths/min): 18 Body Mass Index (BMI): 24.2 Blood Pressure (mmHg): 181/67 Capillary Blood Glucose (mg/dl): 859 Reference Range: 80 - 120 mg / dl Electronic Signature(s) Signed: 03/29/2023 6:21:31 PM By: Karolyn Sharper CHT EMT BS , , Entered By: Karolyn Sharper on 03/29/2023 15:21:31

## 2023-03-30 NOTE — Progress Notes (Addendum)
 [name], [name] S ([id number]) [id number] Page 1 of 2 Visit Report for 03/29/2023 HBO Details Patient Name: Date of Service: [name], [name] 03/29/2023 12:30 PM Medical Record Number: [id number] Patient Account Number: [id number] Date of Birth/Sex: Treating RN: July 04, 1935 (88 y.o. 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 HBO Treatment Details Treatment Number: 19 Patient Type: Outpatient Chamber Type: Monoplace Chamber Serial #: [id number] Treatment Protocol: 2.0 ATA with 90 minutes oxygen , and no air breaks Treatment Details Compression Rate Down: 1.5 psi / minute De-Compression Rate Up: 2.0 psi / minute Air breaks and breathing Decompress Decompress Compress Tx Pressure Begins Reached periods Begins Ends (leave unused spaces blank) Chamber Pressure (ATA 1 2 ------2 1 ) Clock Time (24 hr) 13:06 13:17 - - - - - - 14:47 14:57 Treatment Length: 111 (minutes) Treatment Segments: 4 Vital Signs Capillary Blood Glucose Reference Range: 80 - 120 mg / dl HBO Diabetic Blood Glucose Intervention Range: <131 mg/dl or >750 mg/dl Type: Time Vitals Blood Pulse: Respiratory Temperature: Capillary Blood Glucose Pulse Action Taken: Pressure: Rate: Glucose (mg/dl): Meter #: Oximetry (%) Taken: Pre 12:18 181/67 55 18 97 140 BP retaken Post 15:00 169/82 57 18 97.7 125 asymptomatic for bradycardia Pre 12:59 148/61 54 asymptomatic for bradycardia Treatment Response Treatment Toleration: Well Treatment Completion Status: Treatment Completed without Adverse Event Treatment Notes Mr. Ziller arrived with  vital signs that included BP of 181/67 mmHg and heart rate of 55. After preparing for treatment, BP was re-taken with result of 148/61 mmHg and heart rate of 54. He denied symptoms for bradycardia. He stated that he did eat a meal prior to arrival and content of meal was appropriate for treatment. After performing a safety check, patient was placed in the chamber which was compressed with 100% oxygen  at the rate of 1.5 psi/min after confirming normal ear equalization. He tolerated the treatment and subsequent decompression of the chamber at the rate of 2 psi/min. He denied any issues with ear equalization and/or pain associated with barotrauma. Post-treatment vital signs were within normal range except heart rate of 57 bpm. Again, he denied symptoms of bradycardia. He was stable upon discharge with his wife. Physician HBO Attestation: I certify that I supervised this HBO treatment in accordance with Medicare guidelines. A trained emergency response team is readily available per Yes hospital policies and procedures. Continue HBOT as ordered. Yes Electronic Signature(s) Signed: 03/30/2023 8:16:37 AM By: [name] Delon MD FACS [name], [name] [phone] By: [name] Delon MD FACS [phone].pdf Page 2 of 2 Signed: 03/30/2023 8:16:37 Previous Signature: 03/29/2023 6:30:43 PM Version By: [name] CHT EMT BS , , Entered By: [name] on 03/30/2023 08:16:37 -------------------------------------------------------------------------------- HBO Safety Checklist Details Patient Name: Date of Service: [name], [name] 03/29/2023 12:30 PM Medical Record Number: [id number] Patient Account Number: [id number] Date of Birth/Sex: Treating RN: [birth year] (88 y.o. [name] Nestle Primary Care Mansfield Dann: [name] Other Clinician: Karolyn [name] Referring Panagiota Perfetti: Treating Lular Letson/Extender: [name] [name] [name] in Treatment: 7 HBO Safety  Checklist Items Safety Checklist Consent Form Signed Patient voided / foley secured and emptied 0830 - oatmeal, 1130 Bologna and Cheese with When did you last eato Onion Sandwich Last dose of injectable or oral agent 0810 - Humalog, Metformin   Ostomy pouch emptied and vented if applicable NA All implantable devices assessed, documented and approved Freestyle Libre 2 and3 on approved list Intravenous access site secured and place NA Valuables secured Linens and cotton and cotton/polyester blend (less than 51% polyester) Personal oil-based products / skin lotions / body lotions removed Wigs or hairpieces removed Smoking or tobacco materials removed Books / newspapers / magazines / loose paper removed Cologne, aftershave, perfume and deodorant removed Jewelry removed (may wrap wedding band) Make-up removed NA Hair care products removed Battery operated devices (external) removed Heating patches and chemical warmers removed Titanium eyewear removed Nail polish cured greater than 10 hours NA Casting material cured greater than 10 hours NA Hearing aids removed NA Loose dentures or partials removed NA Prosthetics have been removed NA Patient demonstrates correct use of air break device (if applicable) Patient concerns have been addressed Patient grounding bracelet on and cord attached to chamber Specifics for Inpatients (complete in addition to above) Medication sheet sent with patient NA Intravenous medications needed or due during therapy sent with patient NA Drainage tubes (e.g. nasogastric tube or chest tube secured and vented) NA Endotracheal or Tracheotomy tube secured NA Cuff deflated of air and inflated with saline NA Airway suctioned NA Notes Paper version used prior to treatment start. Electronic Signature(s) Signed: 03/29/2023 6:23:44 PM By: [name] CHT EMT BS , , Entered By: [name] on 03/29/2023 18:23:43

## 2023-04-02 ENCOUNTER — Encounter (HOSPITAL_BASED_OUTPATIENT_CLINIC_OR_DEPARTMENT_OTHER): Payer: Medicare Other | Admitting: General Surgery

## 2023-04-02 DIAGNOSIS — N3041 Irradiation cystitis with hematuria: Secondary | ICD-10-CM | POA: Diagnosis not present

## 2023-04-02 LAB — GLUCOSE, CAPILLARY
Glucose-Capillary: 126 mg/dL — ABNORMAL HIGH (ref 70–99)
Glucose-Capillary: 126 mg/dL — ABNORMAL HIGH (ref 70–99)
Glucose-Capillary: 128 mg/dL — ABNORMAL HIGH (ref 70–99)
Glucose-Capillary: 136 mg/dL — ABNORMAL HIGH (ref 70–99)

## 2023-04-02 NOTE — Progress Notes (Signed)
 Sebring, Keghan S ([id number]) [id number] Page 1 of 1 Visit Report for 03/30/2023 SuperBill Details Patient Name: Date of Service: [name], [name] 03/30/2023 Medical Record Number: [id number] Patient Account Number: [id number] Date of Birth/Sex: Treating RN: 08-02-1935 (88 y.o. [name] Primary Care Provider: [name] Other Clinician: Karolyn Sharper Referring Provider: Treating Provider/Extender: Marolyn Delon [name] Devra in Treatment: 7 Diagnosis Coding ICD-10 Codes Code Description N30.41 Irradiation cystitis with hematuria C61 Malignant neoplasm of prostate E11.9 Type 2 diabetes mellitus without complications Facility Procedures CPT4 Code Description Modifier Quantity [id number] G0277-(Facility Use Only) HBOT full body chamber, , 4 ICD-10 Diagnosis Description N30.41 Irradiation cystitis with hematuria C61 Malignant neoplasm of prostate E11.9 Type 2 diabetes mellitus without complications Physician Procedures Quantity CPT4 Code Description Modifier [id number] - WC PHYS HYPERBARIC OXYGEN  THERAPY 1 ICD-10 Diagnosis Description N30.41 Irradiation cystitis with hematuria C61 Malignant neoplasm of prostate E11.9 Type 2 diabetes mellitus without complications Electronic Signature(s) Signed: 03/30/2023 2:26:31 PM By: Karolyn Sharper CHT EMT BS , , Signed: 04/02/2023 7:46:43 AM By: Marolyn Delon MD FACS Entered By: Karolyn Sharper on 03/30/2023 14:26:31

## 2023-04-03 ENCOUNTER — Encounter (HOSPITAL_BASED_OUTPATIENT_CLINIC_OR_DEPARTMENT_OTHER): Payer: Medicare Other | Admitting: General Surgery

## 2023-04-03 DIAGNOSIS — N3041 Irradiation cystitis with hematuria: Secondary | ICD-10-CM | POA: Diagnosis not present

## 2023-04-03 LAB — GLUCOSE, CAPILLARY
Glucose-Capillary: 171 mg/dL — ABNORMAL HIGH (ref 70–99)
Glucose-Capillary: 143 mg/dL — ABNORMAL HIGH (ref 70–99)

## 2023-04-04 ENCOUNTER — Encounter (HOSPITAL_BASED_OUTPATIENT_CLINIC_OR_DEPARTMENT_OTHER): Payer: Medicare Other | Admitting: General Surgery

## 2023-04-04 DIAGNOSIS — N3041 Irradiation cystitis with hematuria: Secondary | ICD-10-CM | POA: Diagnosis not present

## 2023-04-04 LAB — GLUCOSE, CAPILLARY
Glucose-Capillary: 117 mg/dL — ABNORMAL HIGH (ref 70–99)
Glucose-Capillary: 100 mg/dL — ABNORMAL HIGH (ref 70–99)
Glucose-Capillary: 103 mg/dL — ABNORMAL HIGH (ref 70–99)

## 2023-04-04 NOTE — Progress Notes (Signed)
 Knight, Findlay S ([id number]) [id number] Page 1 of 2 Visit Report for 04/03/2023 Arrival Information Details Patient Name: Date of Service: LAVELLE, AKEL 04/03/2023 12:30 PM Medical Record Number: [id number] Patient Account Number: [id number] Date of Birth/Sex: Treating RN: 1935/10/16 (88 y.o. [name] Primary Care Opie Maclaughlin: [name] Other Clinician: Karolyn Sharper Referring Anureet Bruington: Treating Gabrielle Mester/Extender: Marolyn Delon [name] Devra in Treatment: 7 Visit Information History Since Last Visit All ordered tests and consults were completed: Yes Patient Arrived: Vannie Added or deleted any medications: No Arrival Time: 12:30 Any new allergies or adverse reactions: No Accompanied By: self Had a fall or experienced change in No Transfer Assistance: None activities of daily living that may affect Patient Identification Verified: Yes risk of falls: Secondary Verification Process Completed: Yes Signs or symptoms of abuse/neglect since last visito No Patient Requires Transmission-Based Precautions: No Hospitalized since last visit: No Patient Has Alerts: No Implantable device outside of the clinic excluding No cellular tissue based products placed in the center since last visit: Pain Present Now: No Electronic Signature(s) Signed: 04/03/2023 2:21:52 PM By: Karolyn Sharper CHT EMT BS , , Entered By: Karolyn Sharper on 04/03/2023 14:21:52 -------------------------------------------------------------------------------- Encounter Discharge Information Details Patient Name: Date of Service: [name], Razi S. 04/03/2023 12:30 PM Medical Record Number: [id number] Patient Account Number: [id number] Date of Birth/Sex: Treating RN: [birth year] (88 y.o. [name] Primary Care Cathe Bilger: [name] Other Clinician: Karolyn Sharper Referring Kathrina Crosley: Treating Beatryce Colombo/Extender: Marolyn Delon [name] Devra in Treatment: 7 Encounter Discharge Information Items Discharge Condition: Stable Ambulatory Status: Walker Discharge Destination: Home Transportation: Private Auto Accompanied By: spouse Schedule Follow-up Appointment: No Clinical Summary of Care: Electronic Signature(s) Signed: 04/03/2023 3:10:54 PM By: Karolyn Sharper CHT EMT BS , , Entered By: Karolyn Sharper on 04/03/2023 15:10:54 [name] ([id number]) [id number] Page 2 of 2 -------------------------------------------------------------------------------- Vitals Details Patient Name: Date of Service: TAELOR, MONCADA 04/03/2023 12:30 PM Medical Record Number: [id number] Patient Account Number: [id number] Date of Birth/Sex: Treating RN: 01-26-1936 (88 y.o. [name] Primary Care Jeananne Bedwell: [name] Other Clinician: Karolyn Sharper Referring Ryla Cauthon: Treating Vardaan Depascale/Extender: Marolyn Delon [name] Devra in Treatment: 7 Vital Signs Time Taken: 12:34 Temperature (F): 97.5 Height (in): 66 Pulse (bpm): 56 Weight (lbs): 150 Respiratory Rate (breaths/min): 18 Body Mass Index (BMI): 24.2 Blood Pressure (mmHg): 146/67 Capillary Blood Glucose (mg/dl): 828 Reference Range: 80 - 120 mg / dl Electronic Signature(s) Signed: 04/03/2023 2:22:16 PM By: Karolyn Sharper CHT EMT BS , , Entered By: Karolyn Sharper on 04/03/2023 14:22:16

## 2023-04-04 NOTE — Progress Notes (Signed)
 Rahimi, Thedore S ([id number]) [id number] Page 1 of 1 Visit Report for 04/03/2023 SuperBill Details Patient Name: Date of Service: [name], [name] 04/03/2023 Medical Record Number: [id number] Patient Account Number: [id number] Date of Birth/Sex: Treating RN: June 12, 1935 (88 y.o. [name] Primary Care Provider: [name] Other Clinician: Karolyn Sharper Referring Provider: Treating Provider/Extender: Marolyn Delon [name] Devra in Treatment: 7 Diagnosis Coding ICD-10 Codes Code Description N30.41 Irradiation cystitis with hematuria C61 Malignant neoplasm of prostate E11.9 Type 2 diabetes mellitus without complications Facility Procedures CPT4 Code Description Modifier Quantity [id number] G0277-(Facility Use Only) HBOT full body chamber, , 4 ICD-10 Diagnosis Description N30.41 Irradiation cystitis with hematuria C61 Malignant neoplasm of prostate E11.9 Type 2 diabetes mellitus without complications Physician Procedures Quantity CPT4 Code Description Modifier [id number] - WC PHYS HYPERBARIC OXYGEN  THERAPY 1 ICD-10 Diagnosis Description N30.41 Irradiation cystitis with hematuria C61 Malignant neoplasm of prostate E11.9 Type 2 diabetes mellitus without complications Electronic Signature(s) Signed: 04/03/2023 3:10:02 PM By: Karolyn Sharper CHT EMT BS , , Signed: 04/03/2023 3:48:34 PM By: Marolyn Delon MD FACS Entered By: Karolyn Sharper on 04/03/2023 15:10:02

## 2023-04-04 NOTE — Progress Notes (Signed)
 Beckles, Elihu S ([id number]) [id number] Page 1 of 2 Visit Report for 04/02/2023 Arrival Information Details Patient Name: Date of Service: SOLACE, WENDORFF 04/02/2023 12:30 PM Medical Record Number: [id number] Patient Account Number: [id number] Date of Birth/Sex: Treating RN: 05-Dec-1935 (88 y.o. NETTY [place] Primary Care Lenzi Marmo: [name] Other Clinician: Karolyn Sharper Referring Pranavi Aure: Treating Dayten Juba/Extender: Marolyn Delon [name] Devra in Treatment: 7 Visit Information History Since Last Visit All ordered tests and consults were completed: Yes Patient Arrived: Vannie Added or deleted any medications: No Arrival Time: 12:35 Any new allergies or adverse reactions: No Accompanied By: self Had a fall or experienced change in No Transfer Assistance: None activities of daily living that may affect Patient Identification Verified: Yes risk of falls: Secondary Verification Process Completed: Yes Signs or symptoms of abuse/neglect since last visito No Patient Requires Transmission-Based Precautions: No Hospitalized since last visit: No Patient Has Alerts: No Implantable device outside of the clinic excluding No cellular tissue based products placed in the center since last visit: Pain Present Now: No Electronic Signature(s) Signed: 04/02/2023 3:55:04 PM By: Karolyn Sharper CHT EMT BS , , Entered By: Karolyn Sharper on 04/02/2023 15:55:04 -------------------------------------------------------------------------------- Encounter Discharge Information Details Patient Name: Date of Service: [name], Niall S. 04/02/2023 12:30 PM Medical Record Number: [id number] Patient Account Number: [id number] Date of Birth/Sex: Treating RN: June 06, 1935 (88 y.o. NETTY [place] Primary Care Quanetta Truss: [name] Other Clinician: Karolyn Sharper Referring Lovella Hardie: Treating Kaiesha Tonner/Extender: Marolyn Delon [name] Devra in Treatment: 7 Encounter Discharge Information Items Discharge Condition: Stable Ambulatory Status: Walker Discharge Destination: Home Transportation: Private Auto Accompanied By: spouse Schedule Follow-up Appointment: No Clinical Summary of Care: Electronic Signature(s) Signed: 04/02/2023 4:06:30 PM By: Karolyn Sharper CHT EMT BS , , Entered By: Karolyn Sharper on 04/02/2023 16:06:30 [name] ([id number]) [id number] Page 2 of 2 -------------------------------------------------------------------------------- Vitals Details Patient Name: Date of Service: LOTTIE, SISKA 04/02/2023 12:30 PM Medical Record Number: [id number] Patient Account Number: [id number] Date of Birth/Sex: Treating RN: Apr 15, 1935 (88 y.o. NETTY [place] Primary Care Aylissa Heinemann: [name] Other Clinician: Karolyn Sharper Referring Abdullahi Vallone: Treating Serenity Batley/Extender: Marolyn Delon [name] Devra in Treatment: 7 Vital Signs Time Taken: 13:09 Temperature (F): 98.2 Height (in): 66 Pulse (bpm): 59 Weight (lbs): 150 Respiratory Rate (breaths/min): 18 Body Mass Index (BMI): 24.2 Blood Pressure (mmHg): 151/65 Capillary Blood Glucose (mg/dl): 871 Reference Range: 80 - 120 mg / dl Electronic Signature(s) Signed: 04/02/2023 3:55:39 PM By: Karolyn Sharper CHT EMT BS , , Entered By: Karolyn Sharper on 04/02/2023 15:55:39

## 2023-04-04 NOTE — Progress Notes (Signed)
 Costanzo, Diamante S ([id number]) [id number] Page 1 of 2 Visit Report for 04/02/2023 HBO Details Patient Name: Date of Service: [name], [name] 04/02/2023 12:30 PM Medical Record Number: [id number] Patient Account Number: [id number] Date of Birth/Sex: Treating RN: 1935/10/16 (88 y.o. 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 HBO Treatment Details Treatment Number: 21 Patient Type: Outpatient Chamber Type: Monoplace Chamber Serial #: [id number] Treatment Protocol: 2.0 ATA with 90 minutes oxygen , and no air breaks Treatment Details Compression Rate Down: 1.5 psi / minute De-Compression Rate Up: 2.0 psi / minute Air breaks and breathing Decompress Decompress Compress Tx Pressure Begins Reached periods Begins Ends (leave unused spaces blank) Chamber Pressure (ATA 1 2 ------2 1 ) Clock Time (24 hr) 13:35 13:46 - - - - - - 15:16 15:28 Treatment Length: 113 (minutes) Treatment Segments: 4 Vital Signs Capillary Blood Glucose Reference Range: 80 - 120 mg / dl HBO Diabetic Blood Glucose Intervention Range: <131 mg/dl or >750 mg/dl Type: Time Vitals Blood Pulse: Respiratory Temperature: Capillary Blood Glucose Pulse Action Taken: Pressure: Rate: Glucose (mg/dl): Meter #: Oximetry (%) Taken: Pre 12:34 151/65 59 18 98.2 128 asymptomatic for bradycardia Pre 13:13 126 re-check glucose Pre 13:24 126 cleared for HBOT Post 15:31 136 Treatment Response Treatment Toleration: Well Treatment Completion Status: Treatment Completed without Adverse Event Treatment Notes [name] arrived with vital signs  that included heart rate of 59 bpm and blood glucose level of 128 mg/dL. He denied symptoms for bradycardia. He stated that he did eat a meal prior to arrival and content of meal was somewhat appropriate for treatment, grits and sausage. He was given 8 oz Glucerna and 2 apple juice per orders (1240). After approximately 30 min, blood glucose was measured at 126 mg/dL. Dr. Marolyn was notified and elected to re-measure in approximately 10 minutes. At 1324 blood glucose was remaining at 126 mg/dL. Patient cleared for treatment. 8 oz apple juice sent with patient for safety/intervention. After performing a safety check, patient was placed in the chamber which was compressed with 100% oxygen  at the rate of 2 psi/min after confirming normal ear equalization. He tolerated the treatment and subsequent decompression of the chamber at the rate of 2 psi/min. He denied any issues with ear equalization and/or pain associated with barotrauma. Post-treatment vital signs were within normal range. Blood glucose was 136 mg/dL. He was stable upon discharge with his wife. Physician HBO Attestation: I certify that I supervised this HBO treatment in accordance with Medicare guidelines. A trained emergency response team is readily available per Yes hospital policies and procedures. Continue HBOT as ordered. [address] [name], [name] ([id number]) [id number] Page 2 of 2 Electronic Signature(s) Signed: 04/02/2023 4:42:31 PM By: Marolyn Delon MD FACS Previous Signature: 04/02/2023 4:10:26 PM Version By: Karolyn Sharper CHT EMT BS , , Previous Signature: 04/02/2023 4:09:42 PM Version By: Karolyn Sharper CHT EMT BS , , Previous Signature: 04/02/2023 4:05:39 PM Version By: Karolyn Sharper CHT EMT BS , , Entered By: Marolyn Delon on 04/02/2023 16:42:31 -------------------------------------------------------------------------------- HBO Safety Checklist Details Patient Name: Date of  Service: [name], [name] 04/02/2023 12:30 PM Medical Record Number: [id number] Patient Account Number: [id number] Date of Birth/Sex: Treating RN: 1935/12/10 (88 y.o. [name] Primary Care Yekaterina Escutia: Rexanne,  Tanda Other Clinician: Karolyn Sharper Referring Cedrica Brune: Treating Joandy Burget/Extender: Marolyn Delon Rexanne Tanda Devra in Treatment: 7 HBO Safety Checklist Items Safety Checklist Consent Form Signed Patient voided / foley secured and emptied When did you last eato 1000 - grits, sausage Last dose of injectable or oral agent 0945 - metformin , Humalog early Ostomy pouch emptied and vented if applicable NA All implantable devices assessed, documented and approved Freestyle Libre 2 and3 on approved list Intravenous access site secured and place NA Valuables secured Linens and cotton and cotton/polyester blend (less than 51% polyester) Personal oil-based products / skin lotions / body lotions removed Wigs or hairpieces removed NA Smoking or tobacco materials removed NA Books / newspapers / magazines / loose paper removed Cologne, aftershave, perfume and deodorant removed Jewelry removed (may wrap wedding band) Make-up removed NA Hair care products removed Battery operated devices (external) removed Heating patches and chemical warmers removed Titanium eyewear removed Nail polish cured greater than 10 hours NA Casting material cured greater than 10 hours NA Hearing aids removed NA Loose dentures or partials removed NA Prosthetics have been removed NA Patient demonstrates correct use of air break device (if applicable) Patient concerns have been addressed Patient grounding bracelet on and cord attached to chamber Specifics for Inpatients (complete in addition to above) Medication sheet sent with patient NA Intravenous medications needed or due during therapy sent with patient NA Drainage tubes (e.g. nasogastric tube or chest tube secured and  vented) NA Endotracheal or Tracheotomy tube secured NA Cuff deflated of air and inflated with saline NA Airway suctioned NA Notes Paper version used prior to treatment start. Electronic Signature(s) Signed: 04/02/2023 3:58:50 PM By: Karolyn Sharper CHT EMT BS , , Entered By: Karolyn Sharper on 04/02/2023 15:58:50

## 2023-04-04 NOTE — Progress Notes (Signed)
 Richner, Lorie S ([id number]) [id number] Page 1 of 1 Visit Report for 04/02/2023 SuperBill Details Patient Name: Date of Service: [name], [name] 04/02/2023 Medical Record Number: [id number] Patient Account Number: [id number] Date of Birth/Sex: Treating RN: 1935/04/07 (88 y.o. [name] Primary Care Provider: [name] Other Clinician: Karolyn Sharper Referring Provider: Treating Provider/Extender: Marolyn Delon [name] Devra in Treatment: 7 Diagnosis Coding ICD-10 Codes Code Description N30.41 Irradiation cystitis with hematuria C61 Malignant neoplasm of prostate E11.9 Type 2 diabetes mellitus without complications Facility Procedures CPT4 Code Description Modifier Quantity 58699997 G0277-(Facility Use Only) HBOT full body chamber, , 4 ICD-10 Diagnosis Description N30.41 Irradiation cystitis with hematuria C61 Malignant neoplasm of prostate E11.9 Type 2 diabetes mellitus without complications Physician Procedures Quantity CPT4 Code Description Modifier [id number] - WC PHYS HYPERBARIC OXYGEN  THERAPY 1 ICD-10 Diagnosis Description N30.41 Irradiation cystitis with hematuria C61 Malignant neoplasm of prostate E11.9 Type 2 diabetes mellitus without complications Electronic Signature(s) Signed: 04/02/2023 4:05:54 PM By: Karolyn Sharper CHT EMT BS , , Signed: 04/02/2023 4:43:23 PM By: Marolyn Delon MD FACS Entered By: Karolyn Sharper on 04/02/2023 16:05:53

## 2023-04-04 NOTE — Progress Notes (Signed)
 [name], [name] S ([id number]) [id number] Page 1 of 2 Visit Report for 04/03/2023 HBO Details Patient Name: Date of Service: [name], [name] 04/03/2023 12:30 PM Medical Record Number: [id number] Patient Account Number: [id number] Date of Birth/Sex: Treating RN: 28-Aug-1935 (88 y.o. 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 HBO [name] Details [name] Number: 22 Patient Type: Outpatient Chamber Type: Monoplace Chamber Serial #: [id number] [name] Protocol: 2.0 ATA with 90 minutes oxygen , and no air breaks [name] Details Compression Rate Down: 1.5 psi / minute De-Compression Rate Up: 2.0 psi / minute Air breaks and breathing Decompress Decompress Compress Tx Pressure Begins Reached periods Begins Ends (leave unused spaces blank) Chamber Pressure (ATA 1 2 ------2 1 ) Clock Time (24 hr) 12:57 13:09 - - - - - - 14:39 14:51 [name] Length: 114 (minutes) [name] Segments: 4 Vital Signs Capillary Blood Glucose Reference Range: 80 - 120 mg / dl HBO Diabetic Blood Glucose Intervention Range: <131 mg/dl or >750 mg/dl Type: Time Vitals Blood Pulse: Respiratory Temperature: Capillary Blood Glucose Pulse Action Taken: Pressure: Rate: Glucose (mg/dl): Meter #: Oximetry (%) Taken: Pre 12:34 146/67 56 18 97.5 171 asymptomatic for bradycardia Post 14:54 151/64 56 18 97.3 143 asymptomatic for bradycardia [name] Response [name] Toleration: Well [name] Completion Status: [name] Completed without Adverse Event [name] Notes Mr. Chretien arrived with vital signs that included  heart rate of 56 bpm. He denied symptoms of bradycardia. He stated that he did eat a meal prior to arrival and content of meal appropriate for [name]. After performing a safety check, patient was placed in the chamber which was compressed with 100% oxygen  at the rate of 2 psi/min after confirming normal ear equalization. He tolerated the [name] and subsequent decompression of the chamber at the rate of 2 psi/min. He denied any issues with ear equalization and/or pain associated with barotrauma. Post-[name] vital signs were within normal range except heart rate of 56 bpm. Again, he, denied symptoms related to bradycardia. He was stable upon discharge with his wife. Physician HBO Attestation: I certify that I supervised this HBO [name] in accordance with Medicare guidelines. A trained emergency response team is readily available per Yes hospital policies and procedures. Continue HBOT as ordered. Yes Electronic Signature(s) Signed: 04/03/2023 3:49:02 PM By: Marolyn Delon MD FACS Previous Signature: 04/03/2023 3:09:45 PM Version By: Karolyn Sharper CHT EMT BS , , Entered By: Marolyn Delon on 04/03/2023 15:49:02 Wolz, Harlis S ([id number]) [id number] Page 2 of 2 -------------------------------------------------------------------------------- HBO Safety Checklist Details Patient Name: Date of Service: [name], [name] 04/03/2023 12:30 PM Medical Record Number: [id number] Patient Account Number: [id number] Date of Birth/Sex: Treating RN: 1935/05/23 (88 y.o. [name] Primary Care Lynann Demetrius: [name] Other Clinician: Karolyn Sharper Referring Nattaly Yebra: Treating Nyesha Cliff/Extender: Marolyn Delon [name] [name]: 7 HBO Safety Checklist Items Safety Checklist Consent Form Signed Patient voided / foley secured and emptied Foley 1130 bologna sandwich with turkey, swiss When did you last eato cheese, hotsauce, mustard,  onion. 0900 Oatmeal Last dose of injectable or oral agent 0630 injectable, 1130 Metformin  Ostomy pouch emptied and vented if applicable NA All implantable devices assessed, documented and approved Freestyle Libre 2 and3 on approved list Intravenous access site secured and place NA Valuables secured Linens  and cotton and cotton/polyester blend (less than 51% polyester) Personal oil-based products / skin lotions / body lotions removed Wigs or hairpieces removed NA Smoking or tobacco materials removed NA Books / newspapers / magazines / loose paper removed Cologne, aftershave, perfume and deodorant removed Jewelry removed (may wrap wedding band) Make-up removed NA Hair care products removed Battery operated devices (external) removed Heating patches and chemical warmers removed Titanium eyewear removed Nail polish cured greater than 10 hours NA Casting material cured greater than 10 hours NA Hearing aids removed NA Loose dentures or partials removed NA Prosthetics have been removed NA Patient demonstrates correct use of air break device (if applicable) Patient concerns have been addressed Patient grounding bracelet on and cord attached to chamber Specifics for Inpatients (complete in addition to above) Medication sheet sent with patient NA Intravenous medications needed or due during therapy sent with patient NA Drainage tubes (e.g. nasogastric tube or chest tube secured and vented) NA Endotracheal or Tracheotomy tube secured NA Cuff deflated of air and inflated with saline NA Airway suctioned NA Notes Paper version used prior to [name] start. Electronic Signature(s) Signed: 04/03/2023 2:24:47 PM By: Karolyn Sharper CHT EMT BS , , Entered By: Karolyn Sharper on 04/03/2023 14:24:47

## 2023-04-05 ENCOUNTER — Encounter (HOSPITAL_BASED_OUTPATIENT_CLINIC_OR_DEPARTMENT_OTHER): Payer: Medicare Other | Admitting: General Surgery

## 2023-04-05 DIAGNOSIS — N3041 Irradiation cystitis with hematuria: Secondary | ICD-10-CM | POA: Diagnosis not present

## 2023-04-05 NOTE — Progress Notes (Signed)
ARRIS, RAUTH ([id number]) [id number] Page 1 of 2 Visit Report for 04/05/2023 Arrival Information Details Patient Name: Date of Service: [name], [name] 04/05/2023 12:30 PM Medical Record Number: [id number] Patient Account Number: [id number] Date of Birth/Sex: Treating RN: [birth year] (88 y.o. [name], Millard.Loa Primary Care Jordyan Hardiman: Renford Dills Other Clinician: Haywood Pao Referring Jayen Bromwell: Treating Isaic Syler/Extender: Layla Maw in Treatment: 8 Visit Information History Since Last Visit All ordered tests and consults were completed: Yes Patient Arrived: [name] Added or deleted any medications: No Arrival Time: 12:18 Any new allergies or adverse reactions: No Accompanied By: spouse Had a fall or experienced change in No Transfer Assistance: None activities of daily living that may affect Patient Identification Verified: Yes risk of falls: Secondary Verification Process Completed: Yes Signs or symptoms of abuse/neglect since last visito No Patient Requires Transmission-Based Precautions: No Hospitalized since last visit: No Patient Has Alerts: No Implantable device outside of the clinic excluding No cellular tissue based products placed in the center since last visit: Pain Present Now: No Electronic Signature(s) Signed: 04/05/2023 4:08:19 PM By: Haywood Pao CHT EMT BS , , Entered By: Haywood Pao on 04/05/2023 16:08:19 -------------------------------------------------------------------------------- Encounter Discharge Information Details Patient Name: Date of Service: [name], [name] 04/05/2023 12:30 PM Medical Record Number: [id number] Patient Account Number: [id number] Date of Birth/Sex: Treating RN: 01/12/1936 (88 y.o. Tammy Sours Primary Care Khia Dieterich: Renford Dills Other Clinician: Haywood Pao Referring Hermenia Fritcher: Treating America Sandall/Extender: Layla Maw in Treatment: 8 Encounter Discharge Information Items Discharge Condition: Stable Ambulatory Status: Walker Discharge Destination: Home Transportation: Private Auto Accompanied By: spouse Schedule Follow-up Appointment: No Clinical Summary of Care: Electronic Signature(s) Signed: 04/05/2023 4:14:11 PM By: Haywood Pao CHT EMT BS , , Entered By: Haywood Pao on 04/05/2023 16:14:11 Lawrence, [name] ([id number]) [id number] Page 2 of 2 -------------------------------------------------------------------------------- Vitals Details Patient Name: Date of Service: [name], [name] 04/05/2023 12:30 PM Medical Record Number: [id number] Patient Account Number: [id number] Date of Birth/Sex: Treating RN: 07-29-1935 (88 y.o. Tammy Sours Primary Care Shaquia Berkley: Renford Dills Other Clinician: Haywood Pao Referring Azaan Leask: Treating Nayshawn Mesta/Extender: Layla Maw in Treatment: 8 Vital Signs Time Taken: 12:35 Temperature (F): 98.2 Height (in): 66 Pulse (bpm): 58 Weight (lbs): 150 Respiratory Rate (breaths/min): 18 Body Mass Index (BMI): 24.2 Blood Pressure (mmHg): 153/61 Capillary Blood Glucose (mg/dl): 433 Reference Range: 80 - 120 mg / dl Electronic Signature(s) Signed: 04/05/2023 4:08:41 PM By: Haywood Pao CHT EMT BS , , Entered By: Haywood Pao on 04/05/2023 16:08:41

## 2023-04-05 NOTE — Progress Notes (Signed)
TYSHEEM, LEIMAN ([id number]) [id number] Page 1 of 2 Visit Report for 04/05/2023 HBO Details Patient Name: Date of Service: [name], [name] 04/05/2023 12:30 PM Medical Record Number: [id number] Patient Account Number: [id number] Date of Birth/Sex: Treating RN: 02-Jul-1935 (88 y.o. 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 HBO Treatment Details Treatment Number: 23 Patient Type: Outpatient Chamber Type: Monoplace Chamber Serial #: [id number] Treatment Protocol: 2.0 ATA with 90 minutes oxygen, and no air breaks Treatment Details Compression Rate Down: 1.5 psi / minute De-Compression Rate Up: 2.0 psi / minute Air breaks and breathing Decompress Decompress Compress Tx Pressure Begins Reached periods Begins Ends (leave unused spaces blank) Chamber Pressure (ATA 1 2 ------2 1 ) Clock Time (24 hr) 13:15 13:26 - - - - - - 14:56 15:06 Treatment Length: 111 (minutes) Treatment Segments: 4 Vital Signs Capillary Blood Glucose Reference Range: 80 - 120 mg / dl HBO Diabetic Blood Glucose Intervention Range: <131 mg/dl or >557 mg/dl Type: Time Vitals Blood Respiratory Capillary Blood Glucose Pulse Action Pulse: Temperature: Taken: Pressure: Rate: Glucose (mg/dl): Meter #: Oximetry (%) Taken: Pre 12:35 153/61 58 18 98.2 172 none per protocol Post 15:09 151/54 54 18 97.5 143 asymptomatic Treatment Response Treatment Toleration: Well Treatment Completion Status: Treatment Completed without Adverse Event Treatment Notes Mr. Edd arrived with vital signs that included heart rate of 58 bpm. He  denied symptoms of bradycardia. He stated that he did eat a meal prior to arrival and content of meal mostly appropriate for treatment. After performing a safety check, patient was placed in the chamber which was compressed with 100% oxygen at the rate of 2 psi/min after confirming normal ear equalization. He tolerated the treatment and subsequent decompression of the chamber at the rate of 2 psi/min. He denied any issues with ear equalization and/or pain associated with barotrauma. Post-treatment vital signs were within normal range except heart rate of 54 bpm and diastolic BP of 54 mmHg. Again, he, denied symptoms related to bradycardia/hypotension. He was stable upon discharge with his wife. Physician HBO Attestation: I certify that I supervised this HBO treatment in accordance with Medicare guidelines. A trained emergency response team is readily available per Yes hospital policies and procedures. Continue HBOT as ordered. Yes Electronic Signature(s) Signed: 04/05/2023 4:19:53 PM By: Duanne Guess MD FACS Previous Signature: 04/05/2023 4:13:33 PM Version By: Haywood Pao CHT EMT BS , , [place], [name] ([id number]) [id number] Page 2 of 2 Entered By: Duanne Guess on 04/05/2023 16:19:53 -------------------------------------------------------------------------------- HBO Safety Checklist Details Patient Name: Date of Service: [name], [name] 04/05/2023 12:30 PM Medical Record Number: [id number] Patient Account Number: [id number] Date of Birth/Sex: Treating RN: [birth year] (88 y.o. Tammy Sours Primary Care Jahlen Bollman: Renford Dills Other Clinician: Haywood Pao Referring Neshia Mckenzie: Treating Riki Gehring/Extender: Layla Maw in Treatment: 8 HBO Safety Checklist Items Safety Checklist Consent Form Signed Patient voided / foley secured and emptied Foley 0830 - Oatmeal, 1130 Canned Spaghetti and When did you last  eato Meatballs Last dose of injectable or oral agent 0815 - 16 units Humalog, 1000 Metformin Ostomy pouch emptied and vented if applicable NA All implantable devices assessed, documented and approved Freestyle Libre 2 and3 on approved list Intravenous access site secured and place  NA Valuables secured Linens and cotton and cotton/polyester blend (less than 51% polyester) Personal oil-based products / skin lotions / body lotions removed Wigs or hairpieces removed NA Smoking or tobacco materials removed NA Books / newspapers / magazines / loose paper removed Cologne, aftershave, perfume and deodorant removed Jewelry removed (may wrap wedding band) Make-up removed NA Hair care products removed Battery operated devices (external) removed Heating patches and chemical warmers removed Titanium eyewear removed Nail polish cured greater than 10 hours NA Casting material cured greater than 10 hours NA Hearing aids removed NA Loose dentures or partials removed NA Prosthetics have been removed NA Patient demonstrates correct use of air break device (if applicable) Patient concerns have been addressed Patient grounding bracelet on and cord attached to chamber Specifics for Inpatients (complete in addition to above) Medication sheet sent with patient NA Intravenous medications needed or due during therapy sent with patient NA Drainage tubes (e.g. nasogastric tube or chest tube secured and vented) NA Endotracheal or Tracheotomy tube secured NA Cuff deflated of air and inflated with saline NA Airway suctioned NA Notes Paper version used prior to treatment start. Electronic Signature(s) Signed: 04/05/2023 4:11:00 PM By: Haywood Pao CHT EMT BS , , Entered By: Haywood Pao on 04/05/2023 16:10:59

## 2023-04-05 NOTE — Progress Notes (Signed)
KHADEEM, PAOLA ([id number]) [id number] Page 1 of 1 Visit Report for 04/05/2023 SuperBill Details Patient Name: Date of Service: CHASETON, PAULIN 04/05/2023 Medical Record Number: [id number] Patient Account Number: [id number] Date of Birth/Sex: Treating RN: [birth year] (88 y.o. Tammy Sours Primary Care Provider: Renford Dills Other Clinician: Haywood Pao Referring Provider: Treating Provider/Extender: Layla Maw in Treatment: 8 Diagnosis Coding ICD-10 Codes Code Description N30.41 Irradiation cystitis with hematuria C61 Malignant neoplasm of prostate E11.9 Type 2 diabetes mellitus without complications Facility Procedures CPT4 Code Description Modifier Quantity [id number]-(Facility Use Only) HBOT full body chamber, , 4 ICD-10 Diagnosis Description N30.41 Irradiation cystitis with hematuria C61 Malignant neoplasm of prostate E11.9 Type 2 diabetes mellitus without complications Physician Procedures Quantity CPT4 Code Description Modifier [id number] - WC PHYS HYPERBARIC OXYGEN THERAPY 1 ICD-10 Diagnosis Description N30.41 Irradiation cystitis with hematuria C61 Malignant neoplasm of prostate E11.9 Type 2 diabetes mellitus without complications Electronic Signature(s) Signed: 04/05/2023 4:13:50 PM By: Haywood Pao CHT EMT BS , , Signed: 04/05/2023 4:19:29 PM By: Duanne Guess MD FACS Entered By: Haywood Pao on 04/05/2023 16:13:50

## 2023-04-05 NOTE — Progress Notes (Signed)
DRAEGAN, CARRARA ([id number]) [id number] Page 1 of 1 Visit Report for 04/04/2023 SuperBill Details Patient Name: Date of Service: [name], [name] 04/04/2023 Medical Record Number: [id number] Patient Account Number: [id number] Date of Birth/Sex: Treating RN: 03-28-1935 (87 y.o. [name] Primary Care Provider: Renford Dills Other Clinician: Haywood Pao Referring Provider: Treating Provider/Extender: Layla Maw in Treatment: 8 Diagnosis Coding ICD-10 Codes Code Description N30.41 Irradiation cystitis with hematuria C61 Malignant neoplasm of prostate E11.9 Type 2 diabetes mellitus without complications Facility Procedures CPT4 Code Description Modifier Quantity [id number] [phone] - WOUND CARE VISIT-LEV 1 EST PT 1 Electronic Signature(s) Signed: 04/04/2023 2:48:09 PM By: Haywood Pao CHT EMT BS , , Signed: 04/04/2023 4:06:15 PM By: Duanne Guess MD FACS Previous Signature: 04/04/2023 2:43:20 PM Version By: Haywood Pao CHT EMT BS , , Entered By: Haywood Pao on 04/04/2023 14:48:09

## 2023-04-05 NOTE — Progress Notes (Addendum)
PHAN, NAZARYAN ([id number]) [id number] Page 1 of 4 Visit Report for 04/04/2023 Arrival Information Details Patient Name: Date of Service: [name], [name] 04/04/2023 12:30 PM Medical Record Number: [id number] Patient Account Number: [id number] Date of Birth/Sex: Treating RN: 29-Mar-1935 (88 y.o. [name], [name] Primary Care [name]: [name] Other Clinician: Haywood [name] Referring [name]: Treating [name]/Extender: [name] in Treatment: 8 Visit Information History Since Last Visit All ordered tests and consults were completed: Yes Patient Arrived: [name] Added or deleted any medications: No Arrival Time: 12:19 Any new allergies or adverse reactions: No Accompanied By: self Had a fall or experienced change in No Transfer Assistance: None activities of daily living that may affect Patient Identification Verified: Yes risk of falls: Secondary Verification Process Completed: Yes Signs or symptoms of abuse/neglect since last visito No Patient Requires Transmission-Based Precautions: No Hospitalized since last visit: No Patient Has Alerts: No Implantable device outside of the clinic excluding No cellular tissue based products placed in the [name] since last visit: Pain Present Now: No Electronic Signature([name]) Signed: 04/04/2023 2:37:03 PM By: [name] CHT EMT BS , , Entered By: [name] on 04/04/2023 14:37:03 -------------------------------------------------------------------------------- Clinic Level of Care Assessment Details Patient Name: Date of Service: [name] 04/04/2023 12:30 PM Medical Record Number: [id number] Patient Account Number: [id number] Date of Birth/Sex: Treating RN: 21-Jun-1935 (88 y.o. [name] Primary Care [name]: [name] Other Clinician: Haywood [name] Referring [name]: Treating [name]/Extender: [name] in Treatment: 8 Clinic Level of Care Assessment Items TOOL 4 Quantity Score []  - 0 Use when only an EandM is performed on FOLLOW-UP visit ASSESSMENTS - Nursing Assessment / Reassessment []  - 0 Reassessment of Co-morbidities (includes updates in patient status) []  - 0 Reassessment of Adherence to Treatment Plan ASSESSMENTS - Wound and Skin A ssessment / Reassessment []  - 0 Simple Wound Assessment / Reassessment - one wound []  - 0 Complex Wound Assessment / Reassessment - multiple wounds []  - 0 Dermatologic / Skin Assessment (not related to wound area) ASSESSMENTS - Focused Assessment []  - 0 Circumferential Edema Measurements - multi extremities []  - 0 Nutritional Assessment / Counseling / Intervention [name], [name] ([id number]) [id number] Page 2 of 4 []  - 0 Lower Extremity Assessment (monofilament, tuning fork, pulses) []  - 0 Peripheral Arterial Disease Assessment (using hand held doppler) ASSESSMENTS - Ostomy and/or Continence Assessment and Care []  - 0 Incontinence Assessment and Management []  - 0 Ostomy Care Assessment and Management (repouching, etc.) PROCESS - Coordination of Care []  - 0 Simple Patient / Family Education for ongoing care []  - 0 Complex (extensive) Patient / Family Education for ongoing care []  - 0 Staff obtains Chiropractor, Records, T Results / Process Orders est []  - 0 Staff telephones HHA, Nursing Homes / Clarify orders / etc []  - 0 Routine Transfer to another Facility (non-emergent condition) []  - 0 Routine Hospital Admission (non-emergent condition) []  - 0 New Admissions / Manufacturing engineer / Ordering NPWT Apligraf, etc. , []  - 0 Emergency Hospital Admission (emergent condition) []  - 0 Simple Discharge Coordination []  - 0 Complex (extensive) Discharge Coordination PROCESS - Special Needs []  - 0 Pediatric / Minor Patient Management []  - 0 Isolation Patient Management []  - 0 Hearing /  Language / Visual special needs []  - 0 Assessment of Community assistance (transportation, D/C planning, etc.) []  - 0 Additional assistance / Altered mentation []  - 0 Support Surface([name]) Assessment (bed, cushion, seat, etc.) INTERVENTIONS - Wound Cleansing /  Measurement []  - 0 Simple Wound Cleansing - one wound []  - 0 Complex Wound Cleansing - multiple wounds []  - 0 Wound Imaging (photographs - any number of wounds) []  - 0 Wound Tracing (instead of photographs) []  - 0 Simple Wound Measurement - one wound []  - 0 Complex Wound Measurement - multiple wounds INTERVENTIONS - Wound Dressings []  - 0 Small Wound Dressing one or multiple wounds []  - 0 Medium Wound Dressing one or multiple wounds []  - 0 Large Wound Dressing one or multiple wounds []  - 0 Application of Medications - topical []  - 0 Application of Medications - injection INTERVENTIONS - Miscellaneous []  - 0 External ear exam X- 1 5 Specimen Collection (cultures, biopsies, blood, body fluids, etc.) []  - 0 Specimen([name]) / Culture([name]) sent or taken to Lab for analysis []  - 0 Patient Transfer (multiple staff / Nurse, adult / Similar devices) []  - 0 Simple Staple / Suture removal (25 or less) []  - 0 Complex Staple / Suture removal (26 or more) []  - 0 Hypo / Hyperglycemic Management (close monitor of Blood Glucose) [name], [name] ([id number]) [id number] Page 3 of 4 []  - 0 Ankle / Brachial Index (ABI) - do not check if billed separately X- 1 5 Vital Signs Has the patient been seen at the hospital within the last three years: Yes Total Score: 10 Level Of Care: New/Established - Level 1 Electronic Signature([name]) Signed: 04/09/2023 11:20:26 AM By: [name] CHT EMT BS , , Entered By: [name] on 04/04/2023 14:42:57 -------------------------------------------------------------------------------- Encounter Discharge Information Details Patient Name: Date of Service: [name],  [name]. 04/04/2023 12:30 PM Medical Record Number: [id number] Patient Account Number: [id number] Date of Birth/Sex: Treating RN: 1935-08-02 (88 y.o. [name] Primary Care [name]: [name] Other Clinician: Haywood [name] Referring Norman Bier: Treating Narada Uzzle/Extender: [name] in Treatment: 8 Encounter Discharge Information Items Discharge Condition: Stable Ambulatory Status: Walker Discharge Destination: Home Transportation: Private Auto Accompanied By: spouse Schedule Follow-up Appointment: No Clinical Summary of Care: Electronic Signature([name]) Signed: 04/04/2023 2:43:51 PM By: [name] CHT EMT BS , , Entered By: [name] on 04/04/2023 14:43:51 -------------------------------------------------------------------------------- General Visit Notes Details Patient Name: Date of Service: [name], [name]. 04/04/2023 12:30 PM Medical Record Number: [id number] Patient Account Number: [id number] Date of Birth/Sex: Treating RN: 1935-06-10 (88 y.o. [name] Primary Care Maylene Crocker: [name] Other Clinician: Haywood [name] Referring Violeta Lecount: Treating Jordyne Poehlman/Extender: [name] in Treatment: 8 Notes [name] arrived with vital signs that included heart rate of 59. He denied symptoms related to bradycardia. Blood glucose level was measured at 117 mg/dL at 6440. He was given 8 oz Glucerna and 2 x 4 oz apple juice. He stated that he ate bologna onion and swiss cheese sandwich for lunch at 1130 and oatmeal for breakfast at 0900. 17 units Humalog was taken at 0845 and Metformin at 1000. Blood glucose was retaken at 1301 with result of 103 mg/dL. CBG level decreased; opposite of protocol requirements. Patient chose to wait a short while longer to allow what he consumed to increase blood glucose level (increasing from 114 mg/dL and greater than 347 mg/dL). Blood glucose reading at  1325 was 100 mg/dL. Dr. Lady Gary was informed and treatment was rescheduled for tomorrow (04/05/2023). He was stable upon discharge with his wife. Electronic Signature([name]) Signed: 04/04/2023 2:42:20 PM By: [name] CHT EMT BS , , Entered By: [name] on 04/04/2023 14:42:20 Pinder, [name] ([id number]) [id number] Page 4 of 4 --------------------------------------------------------------------------------  Vitals Details Patient Name: Date of Service: [name], [name] 04/04/2023 12:30 PM Medical Record Number: [id number] Patient Account Number: [id number] Date of Birth/Sex: Treating RN: 03-Jan-1936 (88 y.o. [name] Primary Care Shaneque Merkle: [name] Other Clinician: Haywood [name] Referring Kerin Cecchi: Treating Alexis Reber/Extender: [name] in Treatment: 8 Vital Signs Time Taken: 12:23 Temperature (F): 97.5 Height (in): 66 Pulse (bpm): 59 Weight (lbs): 150 Respiratory Rate (breaths/min): 18 Body Mass Index (BMI): 24.2 Blood Pressure (mmHg): 142/65 Capillary Blood Glucose (mg/dl): 657 Reference Range: 80 - 120 mg / dl Electronic Signature([name]) Signed: 04/04/2023 2:38:04 PM By: [name] CHT EMT BS , , Entered By: [name] on 04/04/2023 14:38:03

## 2023-04-06 ENCOUNTER — Encounter (HOSPITAL_BASED_OUTPATIENT_CLINIC_OR_DEPARTMENT_OTHER): Payer: Medicare Other | Admitting: General Surgery

## 2023-04-06 DIAGNOSIS — N3041 Irradiation cystitis with hematuria: Secondary | ICD-10-CM | POA: Diagnosis not present

## 2023-04-06 LAB — GLUCOSE, CAPILLARY
Glucose-Capillary: 172 mg/dL — ABNORMAL HIGH (ref 70–99)
Glucose-Capillary: 143 mg/dL — ABNORMAL HIGH (ref 70–99)
Glucose-Capillary: 166 mg/dL — ABNORMAL HIGH (ref 70–99)

## 2023-04-06 NOTE — Progress Notes (Signed)
[name] ([id number]) [id number] Page 1 of 2 Visit Report for 04/06/2023 HBO Details Patient Name: Date of Service: [name], [name] 04/06/2023 9:30 A M Medical Record Number: [id number] Patient Account Number: [id number] Date of Birth/Sex: Treating RN: 07/15/1935 (88 y.o. 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 HBO Treatment Details Treatment Number: 24 Patient Type: Outpatient Chamber Type: Monoplace Chamber Serial #: [id number] Treatment Protocol: 2.0 ATA with 90 minutes oxygen, and no air breaks Treatment Details Compression Rate Down: 1.5 psi / minute De-Compression Rate Up: 2.0 psi / minute Air breaks and breathing Decompress Decompress Compress Tx Pressure Begins Reached periods Begins Ends (leave unused spaces blank) Chamber Pressure (ATA 1 2 ------2 1 ) Clock Time (24 hr) 10:36 10:46 - - - - - - 12:16 12:27 Treatment Length: 111 (minutes) Treatment Segments: 4 Vital Signs Capillary Blood Glucose Reference Range: 80 - 120 mg / dl HBO Diabetic Blood Glucose Intervention Range: <131 mg/dl or >295 mg/dl Type: Time Vitals Blood Pulse: Respiratory Temperature: Capillary Blood Glucose Pulse Action Taken: Pressure: Rate: Glucose (mg/dl): Meter #: Oximetry (%) Taken: Pre 12:29 160/63 59 16 98 150 asymptomatic for bradycardia Post 12:29 144/59 57 18 97.9 166 100 asymptomatic bradycardia/hypotension Treatment Response Treatment Toleration: Well Treatment Completion Status: Treatment Completed without Adverse Event Treatment Notes Mr. Looney arrived with vital signs that  included heart rate of 59 bpm. He denied symptoms of bradycardia. He stated that he did eat a meal prior to arrival and content of meal mostly appropriate for treatment. After performing a safety check, patient was placed in the chamber which was compressed with 100% oxygen at the rate of 2 psi/min after confirming normal ear equalization. He tolerated the treatment and subsequent decompression of the chamber at the rate of 2 psi/min. He denied any issues with ear equalization and/or pain associated with barotrauma. Post-treatment vital signs were within normal range except heart rate of 57 bpm and diastolic BP of 59 mmHg. Again, he, denied symptoms related to bradycardia/hypotension. He was stable upon discharge with his wife. Physician HBO Attestation: I certify that I supervised this HBO treatment in accordance with Medicare guidelines. A trained emergency response team is readily available per Yes hospital policies and procedures. Continue HBOT as ordered. Yes Electronic Signature(s) Signed: 04/09/2023 7:51:40 AM By: [name] Guess MD FACS Previous Signature: 04/06/2023 2:12:16 PM Version By: [name] CHT EMT BS , , [place], [name] ([id number]) [id number] Page 2 of 2 Entered By: [name] on 04/09/2023 07:51:40 -------------------------------------------------------------------------------- HBO Safety Checklist Details Patient Name: Date of Service: [name], [name] 04/06/2023 9:30 A M Medical Record Number: [id number] Patient Account Number: [id number] Date of Birth/Sex: Treating RN: [birth year] (88 y.o. [name] Primary Care Ladavion Savitz: [name] Other Clinician: Haywood [name] Referring [name]: Treating [name]/Extender: [name] in Treatment: 8 HBO Safety Checklist Items Safety Checklist Consent Form Signed Patient voided / foley secured and emptied Foley When did you last eato 0800 - Canned  Spaghetti and Meatball Last dose of injectable or oral agent Humalog @ 0745, Metformin @0800  Ostomy pouch emptied and vented if applicable NA All implantable devices assessed, documented and approved Freestyle Libre 2 and3 on approved list Intravenous access site secured and place  NA Valuables secured Linens and cotton and cotton/polyester blend (less than 51% polyester) Personal oil-based products / skin lotions / body lotions removed Wigs or hairpieces removed NA Smoking or tobacco materials removed NA Books / newspapers / magazines / loose paper removed Cologne, aftershave, perfume and deodorant removed Jewelry removed (may wrap wedding band) Make-up removed NA Hair care products removed Battery operated devices (external) removed Heating patches and chemical warmers removed Titanium eyewear removed Nail polish cured greater than 10 hours NA Casting material cured greater than 10 hours NA Hearing aids removed NA Loose dentures or partials removed NA Prosthetics have been removed NA Patient demonstrates correct use of air break device (if applicable) Patient concerns have been addressed Patient grounding bracelet on and cord attached to chamber Specifics for Inpatients (complete in addition to above) Medication sheet sent with patient NA Intravenous medications needed or due during therapy sent with patient NA Drainage tubes (e.g. nasogastric tube or chest tube secured and vented) NA Endotracheal or Tracheotomy tube secured NA Cuff deflated of air and inflated with saline NA Airway suctioned NA Notes Paper version used prior to treatment start. Electronic Signature(s) Signed: 04/06/2023 2:07:57 PM By: [name] CHT EMT BS , , Entered By: [name] on 04/06/2023 14:07:57

## 2023-04-06 NOTE — Progress Notes (Signed)
[name] ([id number]) [id number] Page 1 of 1 Visit Report for 04/06/2023 Arrival Information Details Patient Name: Date of Service: [name], [name] 04/06/2023 9:30 A M Medical Record Number: [id number] Patient Account Number: [id number] Date of Birth/Sex: Treating RN: [birth year] (88 y.o. [name], Millard.Loa Primary Care Shafin Pollio: Renford Dills Other Clinician: Haywood Pao Referring Tylee Yum: Treating Markea Ruzich/Extender: Layla Maw in Treatment: 8 Visit Information History Since Last Visit All ordered tests and consults were completed: Yes Patient Arrived: [name] Added or deleted any medications: No Arrival Time: 09:25 Any new allergies or adverse reactions: No Accompanied By: self Had a fall or experienced change in No Transfer Assistance: None activities of daily living that may affect Patient Identification Verified: Yes risk of falls: Secondary Verification Process Completed: Yes Signs or symptoms of abuse/neglect since last visito No Patient Requires Transmission-Based Precautions: No Hospitalized since last visit: No Patient Has Alerts: No Implantable device outside of the clinic excluding No cellular tissue based products placed in the center since last visit: Pain Present Now: No Electronic Signature(s) Signed: 04/06/2023 2:05:15 PM By: Haywood Pao CHT EMT BS , , Entered By: Haywood Pao on 04/06/2023 14:05:15 -------------------------------------------------------------------------------- Vitals Details Patient Name: Date of Service: [name], [name] 04/06/2023 9:30 A M Medical Record Number: [id number] Patient Account Number: [id number] Date of Birth/Sex: Treating RN: 01/12/1936 (88 y.o. Tammy Sours Primary Care Mansour Balboa: Renford Dills Other Clinician: Haywood Pao Referring Georgia Delsignore: Treating Brit Wernette/Extender: Layla Maw in Treatment: 8 Vital  Signs Time Taken: 12:29 Temperature (F): 97.9 Height (in): 66 Pulse (bpm): 57 Weight (lbs): 150 Respiratory Rate (breaths/min): 18 Body Mass Index (BMI): 24.2 Blood Pressure (mmHg): 144/57 Capillary Blood Glucose (mg/dl): 829 Reference Range: 80 - 120 mg / dl Airway Pulse Oximetry (%): 100 Electronic Signature(s) Signed: 04/06/2023 2:05:37 PM By: Haywood Pao CHT EMT BS , , Entered By: Haywood Pao on 04/06/2023 14:05:37

## 2023-04-09 ENCOUNTER — Encounter (HOSPITAL_BASED_OUTPATIENT_CLINIC_OR_DEPARTMENT_OTHER): Payer: Medicare Other | Admitting: General Surgery

## 2023-04-09 DIAGNOSIS — N3041 Irradiation cystitis with hematuria: Secondary | ICD-10-CM | POA: Diagnosis not present

## 2023-04-09 LAB — GLUCOSE, CAPILLARY
Glucose-Capillary: 150 mg/dL — ABNORMAL HIGH (ref 70–99)
Glucose-Capillary: 167 mg/dL — ABNORMAL HIGH (ref 70–99)

## 2023-04-09 NOTE — Progress Notes (Signed)
[name], [name] ([id number]) [id number] Page 1 of 1 Visit Report for 04/09/2023 SuperBill Details Patient Name: Date of Service: [name], [name] 04/09/2023 Medical Record Number: [id number] Patient Account Number: [id number] Date of Birth/Sex: Treating RN: Sep 09, 1935 (88 y.o. [name] Primary Care Provider: Renford Dills Other Clinician: Haywood Pao Referring Provider: Treating Provider/Extender: Layla Maw in Treatment: 8 Diagnosis Coding ICD-10 Codes Code Description N30.41 Irradiation cystitis with hematuria C61 Malignant neoplasm of prostate E11.9 Type 2 diabetes mellitus without complications Facility Procedures CPT4 Code Description Modifier Quantity [id number] G0277-(Facility Use Only) HBOT full body chamber, , 4 ICD-10 Diagnosis Description N30.41 Irradiation cystitis with hematuria C61 Malignant neoplasm of prostate E11.9 Type 2 diabetes mellitus without complications Physician Procedures Quantity CPT4 Code Description Modifier [id number] - WC PHYS HYPERBARIC OXYGEN THERAPY 1 ICD-10 Diagnosis Description N30.41 Irradiation cystitis with hematuria C61 Malignant neoplasm of prostate E11.9 Type 2 diabetes mellitus without complications Electronic Signature(s) Signed: 04/09/2023 3:52:44 PM By: Haywood Pao CHT EMT BS , , Signed: 04/09/2023 4:00:06 PM By: Duanne Guess MD FACS Entered By: Haywood Pao on 04/09/2023 15:52:44

## 2023-04-09 NOTE — Progress Notes (Addendum)
DECARI, KOPPLIN ([id number]) [id number] Page 1 of 2 Visit Report for 04/09/2023 Arrival Information Details Patient Name: Date of Service: MORTEN, LETO 04/09/2023 12:30 PM Medical Record Number: [id number] Patient Account Number: [id number] Date of Birth/Sex: Treating RN: 12/22/1935 (88 y.o. [name] Primary Care Yesli Vanderhoff: Renford Dills Other Clinician: Haywood Pao Referring Aydn Ferrara: Treating Tymon Nemetz/Extender: Layla Maw in Treatment: 8 Visit Information History Since Last Visit All ordered tests and consults were completed: Yes Patient Arrived: [name] Added or deleted any medications: No Arrival Time: 12:59 Any new allergies or adverse reactions: No Accompanied By: self Had a fall or experienced change in No Transfer Assistance: None activities of daily living that may affect Patient Identification Verified: Yes risk of falls: Secondary Verification Process Completed: Yes Signs or symptoms of abuse/neglect since last visito No Patient Requires Transmission-Based Precautions: No Hospitalized since last visit: No Patient Has Alerts: No Implantable device outside of the clinic excluding No cellular tissue based products placed in the center since last visit: Pain Present Now: No Electronic Signature(s) Signed: 04/09/2023 2:22:05 PM By: Haywood Pao CHT EMT BS , , Entered By: Haywood Pao on 04/09/2023 14:22:05 -------------------------------------------------------------------------------- Encounter Discharge Information Details Patient Name: Date of Service: [name], Kaston S. 04/09/2023 12:30 PM Medical Record Number: [id number] Patient Account Number: [id number] Date of Birth/Sex: Treating RN: 16-Aug-1935 (88 y.o. [name] Primary Care Eria Lozoya: Renford Dills Other Clinician: Haywood Pao Referring Mariah Harn: Treating Kee Drudge/Extender: Layla Maw in Treatment: 8 Encounter Discharge Information Items Discharge Condition: Stable Ambulatory Status: Ambulatory Discharge Destination: Home Transportation: Private Auto Accompanied By: self Schedule Follow-up Appointment: No Clinical Summary of Care: Electronic Signature(s) Signed: 04/09/2023 3:57:56 PM By: Haywood Pao CHT EMT BS , , Entered By: Haywood Pao on 04/09/2023 15:57:56 Grosser, [name] ([id number]) [id number] Page 2 of 2 -------------------------------------------------------------------------------- Vitals Details Patient Name: Date of Service: BEZALEL, SENN 04/09/2023 12:30 PM Medical Record Number: [id number] Patient Account Number: [id number] Date of Birth/Sex: Treating RN: Apr 01, 1935 (88 y.o. [name] Primary Care Jamaiya Tunnell: Renford Dills Other Clinician: Haywood Pao Referring Leilyn Frayre: Treating Arpita Fentress/Extender: Layla Maw in Treatment: 8 Vital Signs Time Taken: 12:59 Temperature (F): 97.5 Height (in): 66 Pulse (bpm): 65 Weight (lbs): 150 Respiratory Rate (breaths/min): 18 Body Mass Index (BMI): 24.2 Blood Pressure (mmHg): 136/99 Capillary Blood Glucose (mg/dl): 433 Reference Range: 80 - 120 mg / dl Electronic Signature(s) Signed: 04/09/2023 2:22:40 PM By: Haywood Pao CHT EMT BS , , Entered By: Haywood Pao on 04/09/2023 14:22:40

## 2023-04-09 NOTE — Progress Notes (Signed)
[name], [name] ([id number]) [id number] Page 1 of 1 Visit Report for 04/06/2023 SuperBill Details Patient Name: Date of Service: [name], [name] 04/06/2023 Medical Record Number: [id number] Patient Account Number: [id number] Date of Birth/Sex: Treating RN: 1935/08/22 (88 y.o. Tammy Sours Primary Care Provider: Renford Dills Other Clinician: Haywood Pao Referring Provider: Treating Provider/Extender: Layla Maw in Treatment: 8 Diagnosis Coding ICD-10 Codes Code Description N30.41 Irradiation cystitis with hematuria C61 Malignant neoplasm of prostate E11.9 Type 2 diabetes mellitus without complications Facility Procedures CPT4 Code Description Modifier Quantity [id number] G0277-(Facility Use Only) HBOT full body chamber, , 4 ICD-10 Diagnosis Description N30.41 Irradiation cystitis with hematuria C61 Malignant neoplasm of prostate E11.9 Type 2 diabetes mellitus without complications Physician Procedures Quantity CPT4 Code Description Modifier [id number] 99183 - WC PHYS HYPERBARIC OXYGEN THERAPY 1 ICD-10 Diagnosis Description N30.41 Irradiation cystitis with hematuria C61 Malignant neoplasm of prostate E11.9 Type 2 diabetes mellitus without complications Electronic Signature(s) Signed: 04/06/2023 2:12:47 PM By: Haywood Pao CHT EMT BS , , Signed: 04/09/2023 7:51:51 AM By: Duanne Guess MD FACS Entered By: Haywood Pao on 04/06/2023 14:12:47

## 2023-04-09 NOTE — Progress Notes (Addendum)
[name], [name] ([id number]) [id number] Page 1 of 2 Visit Report for 04/09/2023 HBO Details Patient Name: Date of Service: [name], [name] 04/09/2023 12:30 PM Medical Record Number: [id number] Patient Account Number: [id number] Date of Birth/Sex: Treating RN: April 03, 1935 (88 y.o. 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 HBO Treatment Details Treatment Number: 25 Patient Type: Outpatient Chamber Type: Monoplace Chamber Serial #: [id number] Treatment Protocol: 2.0 ATA with 90 minutes oxygen, and no air breaks Treatment Details Compression Rate Down: 1.0 psi / minute De-Compression Rate Up: 2.0 psi / minute Air breaks and breathing Decompress Decompress Compress Tx Pressure Begins Reached periods Begins Ends (leave unused spaces blank) Chamber Pressure (ATA 1 2 ------2 1 ) Clock Time (24 hr) 13:25 13:40 - - - - - - 15:10 15:20 Treatment Length: 115 (minutes) Treatment Segments: 4 Vital Signs Capillary Blood Glucose Reference Range: 80 - 120 mg / dl HBO Diabetic Blood Glucose Intervention Range: <131 mg/dl or >269 mg/dl Type: Time Vitals Blood Pulse: Respiratory Temperature: Capillary Blood Glucose Pulse Action Taken: Pressure: Rate: Glucose (mg/dl): Meter #: Oximetry (%) Taken: Pre 12:59 136/99 65 18 97.5 167 none per protocol Post 15:23 130/70 55 18 97.3 149 asymptomatic for bradycardia Treatment Response Treatment Toleration: Well Treatment Completion Status: Treatment Completed without Adverse Event Treatment Notes [name] arrived with vital signs. He stated that he did eat  a meal prior to arrival and content of meal mostly appropriate for treatment. After performing a safety check, patient was placed in the chamber which was compressed with 100% oxygen at the rate of 2 psi/min after confirming normal ear equalization. He tolerated the treatment and subsequent decompression of the chamber at the rate of 2 psi/min. He denied any issues with ear equalization and/or pain associated with barotrauma. Post-treatment vital signs were within normal range except heart rate of 55 bpm. [name] denied symptoms related to bradycardia. He was stable upon discharge with his wife. Physician HBO Attestation: I certify that I supervised this HBO treatment in accordance with Medicare guidelines. A trained emergency response team is readily available per Yes hospital policies and procedures. Continue HBOT as ordered. Yes Electronic Signature([name]) Signed: 04/10/2023 2:17:42 PM By: Haywood Pao CHT EMT BS , , Signed: 04/10/2023 2:19:25 PM By: Duanne Guess MD FACS Previous Signature: 04/09/2023 4:00:56 PM Version By: Duanne Guess MD FACS Previous Signature: 04/09/2023 3:52:27 PM Version By: Haywood Pao CHT EMT BS , , [name], KIRKER [name] ([id number]) [id number] Page 2 of 2 Previous Signature: 04/09/2023 3:52:27 PM Version By: Haywood Pao CHT EMT BS , , Entered By: Haywood Pao on 04/10/2023 14:17:42 -------------------------------------------------------------------------------- HBO Safety Checklist Details Patient Name: Date of Service: [name] 04/09/2023 12:30 PM Medical Record Number: [id number] Patient Account Number: [id number] Date of Birth/Sex: Treating RN: 1936-03-10 (88 y.o. [name] Primary Care Treyshawn Muldrew: Renford Dills Other Clinician: Haywood Pao Referring Elianie Hubers: Treating Elon Eoff/Extender: Layla Maw in Treatment: 8 HBO Safety Checklist Items Safety  Checklist Consent Form Signed Patient voided / foley secured and emptied Foley 0815-oatmeal, 1130 Tuna and Cheese, various When did you last eato other items. Last dose of injectable or oral agent 0800 -Humalog, 1000 Metformin Ostomy pouch emptied and vented if applicable NA All implantable  devices assessed, documented and approved Freestyle Libre 2 and3 on approved list Intravenous access site secured and place NA Valuables secured Linens and cotton and cotton/polyester blend (less than 51% polyester) Personal oil-based products / skin lotions / body lotions removed Wigs or hairpieces removed NA Smoking or tobacco materials removed NA Books / newspapers / magazines / loose paper removed Cologne, aftershave, perfume and deodorant removed Jewelry removed (may wrap wedding band) Make-up removed Hair care products removed Battery operated devices (external) removed Heating patches and chemical warmers removed Titanium eyewear removed Nail polish cured greater than 10 hours NA Casting material cured greater than 10 hours NA Hearing aids removed NA Loose dentures or partials removed NA Prosthetics have been removed NA Patient demonstrates correct use of air break device (if applicable) Patient concerns have been addressed Patient grounding bracelet on and cord attached to chamber Specifics for Inpatients (complete in addition to above) Medication sheet sent with patient NA Intravenous medications needed or due during therapy sent with patient NA Drainage tubes (e.g. nasogastric tube or chest tube secured and vented) NA Endotracheal or Tracheotomy tube secured NA Cuff deflated of air and inflated with saline NA Airway suctioned NA Notes Paper version used prior to treatment start. Electronic Signature([name]) Signed: 04/09/2023 2:26:39 PM By: Haywood Pao CHT EMT BS , , Entered By: Haywood Pao on 04/09/2023 14:26:39

## 2023-04-10 ENCOUNTER — Encounter (HOSPITAL_BASED_OUTPATIENT_CLINIC_OR_DEPARTMENT_OTHER): Payer: Medicare Other | Admitting: General Surgery

## 2023-04-10 DIAGNOSIS — N3041 Irradiation cystitis with hematuria: Secondary | ICD-10-CM | POA: Diagnosis not present

## 2023-04-10 LAB — GLUCOSE, CAPILLARY
Glucose-Capillary: 149 mg/dL — ABNORMAL HIGH (ref 70–99)
Glucose-Capillary: 212 mg/dL — ABNORMAL HIGH (ref 70–99)
Glucose-Capillary: 125 mg/dL — ABNORMAL HIGH (ref 70–99)

## 2023-04-10 NOTE — Progress Notes (Addendum)
CRISTAL, KINZER ([id number]) [id number] Page 1 of 2 Visit Report for 04/10/2023 HBO Details Patient Name: Date of Service: [name], [name] 04/10/2023 11:00 A M Medical Record Number: [id number] Patient Account Number: [id number] Date of Birth/Sex: Treating RN: 02-Mar-1936 (88 y.o. 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 HBO Treatment Details Treatment Number: 26 Patient Type: Outpatient Chamber Type: Monoplace Chamber Serial #: [id number] Treatment Protocol: 2.0 ATA with 90 minutes oxygen, and no air breaks Treatment Details Compression Rate Down: 1.5 psi / minute De-Compression Rate Up: 2.0 psi / minute Air breaks and breathing Decompress Decompress Compress Tx Pressure Begins Reached periods Begins Ends (leave unused spaces blank) Chamber Pressure (ATA 1 2 ------2 1 ) Clock Time (24 hr) 11:23 11:34 - - - - - - 13:04 13:12 Treatment Length: 109 (minutes) Treatment Segments: 4 Vital Signs Capillary Blood Glucose Reference Range: 80 - 120 mg / dl HBO Diabetic Blood Glucose Intervention Range: <131 mg/dl or >308 mg/dl Type: Time Vitals Blood Pulse: Respiratory Temperature: Capillary Blood Glucose Pulse Action Taken: Pressure: Rate: Glucose (mg/dl): Meter #: Oximetry (%) Taken: Pre 10:33 148/67 55 20 97.7 212 asymptomatic for bradycardia Post 13:18 135/62 55 18 97.3 125 asymptomatic for bradycardia Treatment Response Treatment Toleration: Well Treatment Completion Status: Treatment Completed without Adverse Event Treatment Notes [name] arrived with vital signs which included  heart rate of 55 bpm. He denied symptoms of bradycardia. He stated that he did eat a meal prior to arrival and content of not the most appropriate for treatment but had a blood glucose of 212 mg/dL. Dr. Lady Gary informed. 8 oz Apple juice sent into the chamber with patient for safety/intervention. After performing a safety check, patient was placed in the chamber which was compressed with 100% oxygen at the rate of 2 psi/min after confirming normal ear equalization. He tolerated the treatment and subsequent decompression of the chamber at the rate of 2 psi/min. He denied any issues with ear equalization and/or pain associated with barotrauma. Post-treatment vital signs were within normal range except heart rate of 55 bpm. [name] denied symptoms related to bradycardia. He was stable upon discharge with his wife. Physician HBO Attestation: I certify that I supervised this HBO treatment in accordance with Medicare guidelines. A trained emergency response team is readily available per Yes hospital policies and procedures. Continue HBOT as ordered. Yes Electronic Signature(s) Signed: 04/10/2023 3:28:42 PM By: Duanne Guess MD FACS Previous Signature: 04/10/2023 2:19:59 PM Version By: Haywood Pao CHT EMT BS , , [place], [name] ([id number]) [id number] Page 2 of 2 Entered By: Duanne Guess on 04/10/2023 15:28:41 -------------------------------------------------------------------------------- HBO Safety Checklist Details Patient Name: Date of Service: [name], [name] 04/10/2023 11:00 A M Medical Record Number: [id number] Patient Account Number: [id number] Date of Birth/Sex: Treating RN: [birth year] (88 y.o. [name] Primary Care Emmamae Mcnamara: Renford Dills Other Clinician: Haywood Pao Referring Guerline Happ: Treating Cheikh Bramble/Extender: Layla Maw in Treatment: 8 HBO Safety Checklist Items Safety Checklist Consent Form  Signed Patient voided / foley secured and emptied When did you last eato 0800 - Oatmeal and Last dose of injectable or oral agent 0745 Humalog 16 units, 0920 Metformin Ostomy pouch emptied and vented if applicable NA All implantable devices assessed,  documented and approved Freestyle Libre 2 and3 on approved list Intravenous access site secured and place NA Valuables secured Linens and cotton and cotton/polyester blend (less than 51% polyester) Personal oil-based products / skin lotions / body lotions removed Wigs or hairpieces removed NA Smoking or tobacco materials removed NA Books / newspapers / magazines / loose paper removed Cologne, aftershave, perfume and deodorant removed Jewelry removed (may wrap wedding band) Make-up removed NA Hair care products removed Battery operated devices (external) removed Heating patches and chemical warmers removed Titanium eyewear removed Nail polish cured greater than 10 hours NA Casting material cured greater than 10 hours NA Hearing aids removed NA Loose dentures or partials removed NA Prosthetics have been removed NA Patient demonstrates correct use of air break device (if applicable) Patient concerns have been addressed Patient grounding bracelet on and cord attached to chamber Specifics for Inpatients (complete in addition to above) Medication sheet sent with patient NA Intravenous medications needed or due during therapy sent with patient NA Drainage tubes (e.g. nasogastric tube or chest tube secured and vented) NA Endotracheal or Tracheotomy tube secured NA Cuff deflated of air and inflated with saline NA Airway suctioned NA Notes Paper version used prior to treatment start. Electronic Signature(s) Signed: 04/10/2023 2:14:07 PM By: Haywood Pao CHT EMT BS , , Entered By: Haywood Pao on 04/10/2023 14:14:07

## 2023-04-10 NOTE — Progress Notes (Addendum)
NETTIE, FACENDA ([id number]) [id number] Page 1 of 2 Visit Report for 04/10/2023 Arrival Information Details Patient Name: Date of Service: [name], [name] 04/10/2023 11:00 A M Medical Record Number: [id number] Patient Account Number: [id number] Date of Birth/Sex: Treating RN: Oct 27, 1935 (88 y.o. [name], [name] Primary Care Sherran Margolis: Renford Dills Other Clinician: Haywood Pao Referring Jaedah Lords: Treating Cherae Marton/Extender: Layla Maw in Treatment: 8 Visit Information History Since Last Visit All ordered tests and consults were completed: Yes Patient Arrived: [name] Added or deleted any medications: No Arrival Time: 10:20 Any new allergies or adverse reactions: No Accompanied By: spouse Had a fall or experienced change in No Transfer Assistance: None activities of daily living that may affect Patient Identification Verified: Yes risk of falls: Secondary Verification Process Completed: Yes Signs or symptoms of abuse/neglect since last visito No Patient Requires Transmission-Based Precautions: No Hospitalized since last visit: No Patient Has Alerts: No Implantable device outside of the clinic excluding No cellular tissue based products placed in the center since last visit: Pain Present Now: No Electronic Signature(s) Signed: 04/10/2023 2:12:24 PM By: Haywood Pao CHT EMT BS , , Entered By: Haywood Pao on 04/10/2023 14:12:24 -------------------------------------------------------------------------------- Encounter Discharge Information Details Patient Name: Date of Service: [name], [name] 04/10/2023 11:00 A M Medical Record Number: [id number] Patient Account Number: [id number] Date of Birth/Sex: Treating RN: Jan 14, 1936 (88 y.o. [name] Primary Care Sir Mallis: Renford Dills Other Clinician: Haywood Pao Referring Corbin Hott: Treating Myrah Strawderman/Extender: Layla Maw in Treatment: 8 Encounter Discharge Information Items Discharge Condition: Stable Ambulatory Status: Walker Discharge Destination: Home Transportation: Private Auto Accompanied By: spouse Schedule Follow-up Appointment: No Clinical Summary of Care: Electronic Signature(s) Signed: 04/10/2023 2:20:56 PM By: Haywood Pao CHT EMT BS , , Entered By: Haywood Pao on 04/10/2023 14:20:56 [name] ([id number]) [id number] Page 2 of 2 -------------------------------------------------------------------------------- Vitals Details Patient Name: Date of Service: [name], [name] 04/10/2023 11:00 A M Medical Record Number: [id number] Patient Account Number: [id number] Date of Birth/Sex: Treating RN: [birth year] (88 y.o. [name] Primary Care Julyana Woolverton: Renford Dills Other Clinician: Haywood Pao Referring Kynedi Profitt: Treating Jodine Muchmore/Extender: Layla Maw in Treatment: 8 Vital Signs Time Taken: 10:33 Temperature (F): 97.7 Height (in): 66 Pulse (bpm): 55 Weight (lbs): 150 Respiratory Rate (breaths/min): 20 Body Mass Index (BMI): 24.2 Blood Pressure (mmHg): 148/67 Capillary Blood Glucose (mg/dl): 433 Reference Range: 80 - 120 mg / dl Electronic Signature(s) Signed: 04/10/2023 2:12:48 PM By: Haywood Pao CHT EMT BS , , Entered By: Haywood Pao on 04/10/2023 14:12:48

## 2023-04-10 NOTE — Progress Notes (Signed)
MAHMOOD, SOKOLOW ([id number]) [id number] Page 1 of 1 Visit Report for 04/10/2023 SuperBill Details Patient Name: Date of Service: [name], [name] 04/10/2023 Medical Record Number: [id number] Patient Account Number: [id number] Date of Birth/Sex: Treating RN: 01/29/1936 (88 y.o. Tammy Sours Primary Care Provider: Renford Dills Other Clinician: Haywood Pao Referring Provider: Treating Provider/Extender: Layla Maw in Treatment: 8 Diagnosis Coding ICD-10 Codes Code Description N30.41 Irradiation cystitis with hematuria C61 Malignant neoplasm of prostate E11.9 Type 2 diabetes mellitus without complications Facility Procedures CPT4 Code Description Modifier Quantity [id number]-(Facility Use Only) HBOT full body chamber, , 4 ICD-10 Diagnosis Description N30.41 Irradiation cystitis with hematuria C61 Malignant neoplasm of prostate E11.9 Type 2 diabetes mellitus without complications Physician Procedures Quantity CPT4 Code Description Modifier [id number] - WC PHYS HYPERBARIC OXYGEN THERAPY 1 ICD-10 Diagnosis Description N30.41 Irradiation cystitis with hematuria C61 Malignant neoplasm of prostate E11.9 Type 2 diabetes mellitus without complications Electronic Signature(s) Signed: 04/10/2023 2:20:18 PM By: Haywood Pao CHT EMT BS , , Signed: 04/10/2023 3:28:22 PM By: Duanne Guess MD FACS Entered By: Haywood Pao on 04/10/2023 14:20:18

## 2023-04-11 ENCOUNTER — Encounter (HOSPITAL_BASED_OUTPATIENT_CLINIC_OR_DEPARTMENT_OTHER): Payer: Medicare Other | Admitting: General Surgery

## 2023-04-11 DIAGNOSIS — N3041 Irradiation cystitis with hematuria: Secondary | ICD-10-CM | POA: Diagnosis not present

## 2023-04-11 LAB — GLUCOSE, CAPILLARY
Glucose-Capillary: 229 mg/dL — ABNORMAL HIGH (ref 70–99)
Glucose-Capillary: 114 mg/dL — ABNORMAL HIGH (ref 70–99)

## 2023-04-12 ENCOUNTER — Encounter (HOSPITAL_BASED_OUTPATIENT_CLINIC_OR_DEPARTMENT_OTHER): Payer: Medicare Other | Admitting: General Surgery

## 2023-04-12 DIAGNOSIS — N3041 Irradiation cystitis with hematuria: Secondary | ICD-10-CM | POA: Diagnosis not present

## 2023-04-12 LAB — GLUCOSE, CAPILLARY
Glucose-Capillary: 150 mg/dL — ABNORMAL HIGH (ref 70–99)
Glucose-Capillary: 121 mg/dL — ABNORMAL HIGH (ref 70–99)

## 2023-04-13 ENCOUNTER — Encounter (HOSPITAL_BASED_OUTPATIENT_CLINIC_OR_DEPARTMENT_OTHER): Payer: Medicare Other | Admitting: General Surgery

## 2023-04-13 DIAGNOSIS — N3041 Irradiation cystitis with hematuria: Secondary | ICD-10-CM | POA: Diagnosis not present

## 2023-04-13 LAB — GLUCOSE, CAPILLARY
Glucose-Capillary: 181 mg/dL — ABNORMAL HIGH (ref 70–99)
Glucose-Capillary: 104 mg/dL — ABNORMAL HIGH (ref 70–99)

## 2023-04-16 ENCOUNTER — Encounter (HOSPITAL_BASED_OUTPATIENT_CLINIC_OR_DEPARTMENT_OTHER): Payer: Medicare Other | Admitting: General Surgery

## 2023-04-16 DIAGNOSIS — N3041 Irradiation cystitis with hematuria: Secondary | ICD-10-CM | POA: Diagnosis not present

## 2023-04-16 LAB — GLUCOSE, CAPILLARY
Glucose-Capillary: 141 mg/dL — ABNORMAL HIGH (ref 70–99)
Glucose-Capillary: 116 mg/dL — ABNORMAL HIGH (ref 70–99)

## 2023-04-17 ENCOUNTER — Encounter (HOSPITAL_BASED_OUTPATIENT_CLINIC_OR_DEPARTMENT_OTHER): Payer: Medicare Other | Admitting: General Surgery

## 2023-04-17 DIAGNOSIS — N3041 Irradiation cystitis with hematuria: Secondary | ICD-10-CM | POA: Diagnosis not present

## 2023-04-17 LAB — GLUCOSE, CAPILLARY
Glucose-Capillary: 116 mg/dL — ABNORMAL HIGH (ref 70–99)
Glucose-Capillary: 128 mg/dL — ABNORMAL HIGH (ref 70–99)
Glucose-Capillary: 196 mg/dL — ABNORMAL HIGH (ref 70–99)

## 2023-04-18 ENCOUNTER — Encounter (HOSPITAL_BASED_OUTPATIENT_CLINIC_OR_DEPARTMENT_OTHER): Payer: Medicare Other | Admitting: General Surgery

## 2023-04-18 DIAGNOSIS — N3041 Irradiation cystitis with hematuria: Secondary | ICD-10-CM | POA: Diagnosis not present

## 2023-04-18 LAB — GLUCOSE, CAPILLARY
Glucose-Capillary: 142 mg/dL — ABNORMAL HIGH (ref 70–99)
Glucose-Capillary: 160 mg/dL — ABNORMAL HIGH (ref 70–99)

## 2023-04-19 ENCOUNTER — Encounter (HOSPITAL_BASED_OUTPATIENT_CLINIC_OR_DEPARTMENT_OTHER): Payer: Medicare Other | Admitting: General Surgery

## 2023-04-19 DIAGNOSIS — N3041 Irradiation cystitis with hematuria: Secondary | ICD-10-CM | POA: Diagnosis not present

## 2023-04-19 LAB — GLUCOSE, CAPILLARY
Glucose-Capillary: 166 mg/dL — ABNORMAL HIGH (ref 70–99)
Glucose-Capillary: 137 mg/dL — ABNORMAL HIGH (ref 70–99)

## 2023-04-20 ENCOUNTER — Encounter (HOSPITAL_BASED_OUTPATIENT_CLINIC_OR_DEPARTMENT_OTHER): Payer: Medicare Other | Admitting: General Surgery

## 2023-04-20 DIAGNOSIS — N3041 Irradiation cystitis with hematuria: Secondary | ICD-10-CM | POA: Diagnosis not present

## 2023-04-20 LAB — GLUCOSE, CAPILLARY
Glucose-Capillary: 184 mg/dL — ABNORMAL HIGH (ref 70–99)
Glucose-Capillary: 96 mg/dL (ref 70–99)

## 2023-04-23 ENCOUNTER — Encounter (HOSPITAL_BASED_OUTPATIENT_CLINIC_OR_DEPARTMENT_OTHER): Payer: Medicare Other | Attending: General Surgery | Admitting: General Surgery

## 2023-04-23 DIAGNOSIS — Z9079 Acquired absence of other genital organ(s): Secondary | ICD-10-CM | POA: Diagnosis not present

## 2023-04-23 DIAGNOSIS — E119 Type 2 diabetes mellitus without complications: Secondary | ICD-10-CM | POA: Diagnosis not present

## 2023-04-23 DIAGNOSIS — Z8546 Personal history of malignant neoplasm of prostate: Secondary | ICD-10-CM | POA: Insufficient documentation

## 2023-04-23 DIAGNOSIS — N3041 Irradiation cystitis with hematuria: Secondary | ICD-10-CM | POA: Insufficient documentation

## 2023-04-23 DIAGNOSIS — Z923 Personal history of irradiation: Secondary | ICD-10-CM | POA: Insufficient documentation

## 2023-04-23 LAB — GLUCOSE, CAPILLARY
Glucose-Capillary: 179 mg/dL — ABNORMAL HIGH (ref 70–99)
Glucose-Capillary: 120 mg/dL — ABNORMAL HIGH (ref 70–99)

## 2023-04-24 ENCOUNTER — Encounter (HOSPITAL_BASED_OUTPATIENT_CLINIC_OR_DEPARTMENT_OTHER): Payer: Medicare Other | Admitting: General Surgery

## 2023-04-24 DIAGNOSIS — N3041 Irradiation cystitis with hematuria: Secondary | ICD-10-CM | POA: Diagnosis not present

## 2023-04-24 LAB — GLUCOSE, CAPILLARY
Glucose-Capillary: 162 mg/dL — ABNORMAL HIGH (ref 70–99)
Glucose-Capillary: 130 mg/dL — ABNORMAL HIGH (ref 70–99)

## 2023-04-25 ENCOUNTER — Encounter (HOSPITAL_BASED_OUTPATIENT_CLINIC_OR_DEPARTMENT_OTHER): Payer: Medicare Other | Admitting: General Surgery

## 2023-04-25 DIAGNOSIS — N3041 Irradiation cystitis with hematuria: Secondary | ICD-10-CM | POA: Diagnosis not present

## 2023-04-25 LAB — GLUCOSE, CAPILLARY
Glucose-Capillary: 173 mg/dL — ABNORMAL HIGH (ref 70–99)
Glucose-Capillary: 153 mg/dL — ABNORMAL HIGH (ref 70–99)

## 2023-04-26 ENCOUNTER — Encounter (HOSPITAL_BASED_OUTPATIENT_CLINIC_OR_DEPARTMENT_OTHER): Payer: Medicare Other | Admitting: General Surgery

## 2023-04-26 DIAGNOSIS — N3041 Irradiation cystitis with hematuria: Secondary | ICD-10-CM | POA: Diagnosis not present

## 2023-04-26 LAB — GLUCOSE, CAPILLARY
Glucose-Capillary: 120 mg/dL — ABNORMAL HIGH (ref 70–99)
Glucose-Capillary: 125 mg/dL — ABNORMAL HIGH (ref 70–99)
Glucose-Capillary: 174 mg/dL — ABNORMAL HIGH (ref 70–99)

## 2023-04-27 ENCOUNTER — Encounter (HOSPITAL_BASED_OUTPATIENT_CLINIC_OR_DEPARTMENT_OTHER): Payer: Medicare Other | Admitting: General Surgery

## 2023-04-27 DIAGNOSIS — N3041 Irradiation cystitis with hematuria: Secondary | ICD-10-CM | POA: Diagnosis not present

## 2023-04-27 LAB — GLUCOSE, CAPILLARY
Glucose-Capillary: 221 mg/dL — ABNORMAL HIGH (ref 70–99)
Glucose-Capillary: 119 mg/dL — ABNORMAL HIGH (ref 70–99)

## 2023-04-30 ENCOUNTER — Encounter (HOSPITAL_BASED_OUTPATIENT_CLINIC_OR_DEPARTMENT_OTHER): Payer: Medicare Other | Admitting: General Surgery

## 2023-04-30 DIAGNOSIS — N3041 Irradiation cystitis with hematuria: Secondary | ICD-10-CM | POA: Diagnosis not present

## 2023-04-30 LAB — GLUCOSE, CAPILLARY
Glucose-Capillary: 228 mg/dL — ABNORMAL HIGH (ref 70–99)
Glucose-Capillary: 144 mg/dL — ABNORMAL HIGH (ref 70–99)
Glucose-Capillary: 108 mg/dL — ABNORMAL HIGH (ref 70–99)

## 2023-05-01 ENCOUNTER — Encounter (HOSPITAL_BASED_OUTPATIENT_CLINIC_OR_DEPARTMENT_OTHER): Payer: Medicare Other | Admitting: General Surgery

## 2023-05-01 DIAGNOSIS — N3041 Irradiation cystitis with hematuria: Secondary | ICD-10-CM | POA: Diagnosis not present

## 2023-05-01 LAB — GLUCOSE, CAPILLARY
Glucose-Capillary: 145 mg/dL — ABNORMAL HIGH (ref 70–99)
Glucose-Capillary: 132 mg/dL — ABNORMAL HIGH (ref 70–99)

## 2023-05-02 ENCOUNTER — Encounter (HOSPITAL_BASED_OUTPATIENT_CLINIC_OR_DEPARTMENT_OTHER): Payer: Medicare Other | Admitting: General Surgery

## 2023-05-02 DIAGNOSIS — N3041 Irradiation cystitis with hematuria: Secondary | ICD-10-CM | POA: Diagnosis not present

## 2023-05-02 LAB — GLUCOSE, CAPILLARY
Glucose-Capillary: 154 mg/dL — ABNORMAL HIGH (ref 70–99)
Glucose-Capillary: 113 mg/dL — ABNORMAL HIGH (ref 70–99)

## 2023-05-03 ENCOUNTER — Encounter (HOSPITAL_BASED_OUTPATIENT_CLINIC_OR_DEPARTMENT_OTHER): Payer: Medicare Other | Admitting: General Surgery

## 2023-05-03 DIAGNOSIS — N3041 Irradiation cystitis with hematuria: Secondary | ICD-10-CM | POA: Diagnosis not present

## 2023-05-03 LAB — GLUCOSE, CAPILLARY
Glucose-Capillary: 121 mg/dL — ABNORMAL HIGH (ref 70–99)
Glucose-Capillary: 132 mg/dL — ABNORMAL HIGH (ref 70–99)

## 2023-05-04 ENCOUNTER — Encounter (HOSPITAL_BASED_OUTPATIENT_CLINIC_OR_DEPARTMENT_OTHER): Payer: Medicare Other | Admitting: General Surgery

## 2023-05-04 DIAGNOSIS — N3041 Irradiation cystitis with hematuria: Secondary | ICD-10-CM | POA: Diagnosis not present

## 2023-05-04 LAB — GLUCOSE, CAPILLARY
Glucose-Capillary: 125 mg/dL — ABNORMAL HIGH (ref 70–99)
Glucose-Capillary: 193 mg/dL — ABNORMAL HIGH (ref 70–99)
Glucose-Capillary: 119 mg/dL — ABNORMAL HIGH (ref 70–99)

## 2023-05-07 ENCOUNTER — Encounter (HOSPITAL_BASED_OUTPATIENT_CLINIC_OR_DEPARTMENT_OTHER): Payer: Medicare Other | Admitting: General Surgery

## 2023-05-08 ENCOUNTER — Encounter (HOSPITAL_BASED_OUTPATIENT_CLINIC_OR_DEPARTMENT_OTHER): Payer: Medicare Other | Admitting: General Surgery

## 2023-05-08 DIAGNOSIS — N3041 Irradiation cystitis with hematuria: Secondary | ICD-10-CM | POA: Diagnosis not present

## 2023-05-08 LAB — GLUCOSE, CAPILLARY
Glucose-Capillary: 197 mg/dL — ABNORMAL HIGH (ref 70–99)
Glucose-Capillary: 135 mg/dL — ABNORMAL HIGH (ref 70–99)

## 2023-05-09 ENCOUNTER — Encounter (HOSPITAL_BASED_OUTPATIENT_CLINIC_OR_DEPARTMENT_OTHER): Payer: Medicare Other | Admitting: General Surgery

## 2023-05-09 DIAGNOSIS — N3041 Irradiation cystitis with hematuria: Secondary | ICD-10-CM | POA: Diagnosis not present

## 2023-05-09 LAB — GLUCOSE, CAPILLARY
Glucose-Capillary: 202 mg/dL — ABNORMAL HIGH (ref 70–99)
Glucose-Capillary: 132 mg/dL — ABNORMAL HIGH (ref 70–99)

## 2023-05-10 ENCOUNTER — Encounter (HOSPITAL_BASED_OUTPATIENT_CLINIC_OR_DEPARTMENT_OTHER): Payer: Medicare Other | Admitting: General Surgery

## 2023-05-10 DIAGNOSIS — N3041 Irradiation cystitis with hematuria: Secondary | ICD-10-CM | POA: Diagnosis not present

## 2023-05-10 LAB — GLUCOSE, CAPILLARY
Glucose-Capillary: 223 mg/dL — ABNORMAL HIGH (ref 70–99)
Glucose-Capillary: 110 mg/dL — ABNORMAL HIGH (ref 70–99)

## 2023-05-11 ENCOUNTER — Encounter (HOSPITAL_BASED_OUTPATIENT_CLINIC_OR_DEPARTMENT_OTHER): Payer: Medicare Other | Admitting: General Surgery

## 2023-05-11 DIAGNOSIS — N3041 Irradiation cystitis with hematuria: Secondary | ICD-10-CM | POA: Diagnosis not present

## 2023-05-11 LAB — GLUCOSE, CAPILLARY
Glucose-Capillary: 219 mg/dL — ABNORMAL HIGH (ref 70–99)
Glucose-Capillary: 101 mg/dL — ABNORMAL HIGH (ref 70–99)

## 2023-05-14 ENCOUNTER — Encounter (HOSPITAL_BASED_OUTPATIENT_CLINIC_OR_DEPARTMENT_OTHER): Payer: Medicare Other | Admitting: General Surgery

## 2023-05-14 DIAGNOSIS — N3041 Irradiation cystitis with hematuria: Secondary | ICD-10-CM | POA: Diagnosis not present

## 2023-05-14 LAB — GLUCOSE, CAPILLARY: Glucose-Capillary: 209 mg/dL — ABNORMAL HIGH (ref 70–99)

## 2023-05-15 ENCOUNTER — Encounter (HOSPITAL_BASED_OUTPATIENT_CLINIC_OR_DEPARTMENT_OTHER): Payer: Medicare Other | Admitting: General Surgery

## 2023-05-15 DIAGNOSIS — N3041 Irradiation cystitis with hematuria: Secondary | ICD-10-CM | POA: Diagnosis not present

## 2023-05-15 LAB — GLUCOSE, CAPILLARY
Glucose-Capillary: 203 mg/dL — ABNORMAL HIGH (ref 70–99)
Glucose-Capillary: 167 mg/dL — ABNORMAL HIGH (ref 70–99)
Glucose-Capillary: 107 mg/dL — ABNORMAL HIGH (ref 70–99)

## 2023-05-16 ENCOUNTER — Encounter (HOSPITAL_BASED_OUTPATIENT_CLINIC_OR_DEPARTMENT_OTHER): Payer: Medicare Other | Admitting: General Surgery

## 2023-05-16 DIAGNOSIS — N3041 Irradiation cystitis with hematuria: Secondary | ICD-10-CM | POA: Diagnosis not present

## 2023-05-16 LAB — GLUCOSE, CAPILLARY
Glucose-Capillary: 195 mg/dL — ABNORMAL HIGH (ref 70–99)
Glucose-Capillary: 79 mg/dL (ref 70–99)

## 2023-05-17 ENCOUNTER — Encounter (HOSPITAL_BASED_OUTPATIENT_CLINIC_OR_DEPARTMENT_OTHER): Payer: Medicare Other | Admitting: General Surgery

## 2023-05-17 DIAGNOSIS — N3041 Irradiation cystitis with hematuria: Secondary | ICD-10-CM | POA: Diagnosis not present

## 2023-05-17 LAB — GLUCOSE, CAPILLARY
Glucose-Capillary: 177 mg/dL — ABNORMAL HIGH (ref 70–99)
Glucose-Capillary: 128 mg/dL — ABNORMAL HIGH (ref 70–99)

## 2023-05-18 ENCOUNTER — Encounter (HOSPITAL_BASED_OUTPATIENT_CLINIC_OR_DEPARTMENT_OTHER): Payer: Medicare Other | Admitting: General Surgery

## 2023-05-18 DIAGNOSIS — N3041 Irradiation cystitis with hematuria: Secondary | ICD-10-CM | POA: Diagnosis not present

## 2023-05-18 LAB — GLUCOSE, CAPILLARY
Glucose-Capillary: 154 mg/dL — ABNORMAL HIGH (ref 70–99)
Glucose-Capillary: 101 mg/dL — ABNORMAL HIGH (ref 70–99)

## 2023-05-21 ENCOUNTER — Encounter (HOSPITAL_BASED_OUTPATIENT_CLINIC_OR_DEPARTMENT_OTHER): Attending: General Surgery | Admitting: General Surgery

## 2023-05-21 DIAGNOSIS — C61 Malignant neoplasm of prostate: Secondary | ICD-10-CM | POA: Insufficient documentation

## 2023-05-21 DIAGNOSIS — N3041 Irradiation cystitis with hematuria: Secondary | ICD-10-CM | POA: Diagnosis present

## 2023-05-21 DIAGNOSIS — E119 Type 2 diabetes mellitus without complications: Secondary | ICD-10-CM | POA: Diagnosis not present

## 2023-05-21 LAB — GLUCOSE, CAPILLARY
Glucose-Capillary: 148 mg/dL — ABNORMAL HIGH (ref 70–99)
Glucose-Capillary: 148 mg/dL — ABNORMAL HIGH (ref 70–99)

## 2023-05-22 ENCOUNTER — Encounter (HOSPITAL_BASED_OUTPATIENT_CLINIC_OR_DEPARTMENT_OTHER): Attending: General Surgery | Admitting: General Surgery

## 2023-05-22 DIAGNOSIS — N3289 Other specified disorders of bladder: Secondary | ICD-10-CM | POA: Diagnosis present

## 2023-05-22 DIAGNOSIS — E119 Type 2 diabetes mellitus without complications: Secondary | ICD-10-CM | POA: Insufficient documentation

## 2023-05-22 LAB — GLUCOSE, CAPILLARY
Glucose-Capillary: 187 mg/dL — ABNORMAL HIGH (ref 70–99)
Glucose-Capillary: 128 mg/dL — ABNORMAL HIGH (ref 70–99)

## 2023-05-23 ENCOUNTER — Encounter (HOSPITAL_BASED_OUTPATIENT_CLINIC_OR_DEPARTMENT_OTHER): Attending: General Surgery | Admitting: General Surgery

## 2023-05-23 DIAGNOSIS — Y842 Radiological procedure and radiotherapy as the cause of abnormal reaction of the patient, or of later complication, without mention of misadventure at the time of the procedure: Secondary | ICD-10-CM | POA: Diagnosis not present

## 2023-05-23 DIAGNOSIS — Y828 Other medical devices associated with adverse incidents: Secondary | ICD-10-CM | POA: Diagnosis not present

## 2023-05-23 DIAGNOSIS — L598 Other specified disorders of the skin and subcutaneous tissue related to radiation: Secondary | ICD-10-CM | POA: Insufficient documentation

## 2023-05-23 LAB — GLUCOSE, CAPILLARY
Glucose-Capillary: 153 mg/dL — ABNORMAL HIGH (ref 70–99)
Glucose-Capillary: 165 mg/dL — ABNORMAL HIGH (ref 70–99)

## 2023-05-24 ENCOUNTER — Encounter (HOSPITAL_BASED_OUTPATIENT_CLINIC_OR_DEPARTMENT_OTHER): Attending: General Surgery | Admitting: Internal Medicine

## 2023-05-24 DIAGNOSIS — C61 Malignant neoplasm of prostate: Secondary | ICD-10-CM | POA: Diagnosis not present

## 2023-05-24 DIAGNOSIS — E119 Type 2 diabetes mellitus without complications: Secondary | ICD-10-CM | POA: Insufficient documentation

## 2023-05-24 DIAGNOSIS — N3041 Irradiation cystitis with hematuria: Secondary | ICD-10-CM | POA: Diagnosis present

## 2023-05-24 LAB — GLUCOSE, CAPILLARY
Glucose-Capillary: 157 mg/dL — ABNORMAL HIGH (ref 70–99)
Glucose-Capillary: 173 mg/dL — ABNORMAL HIGH (ref 70–99)

## 2023-05-25 ENCOUNTER — Encounter (HOSPITAL_BASED_OUTPATIENT_CLINIC_OR_DEPARTMENT_OTHER): Admitting: General Surgery

## 2023-05-25 DIAGNOSIS — N3041 Irradiation cystitis with hematuria: Secondary | ICD-10-CM | POA: Diagnosis not present

## 2023-05-25 LAB — GLUCOSE, CAPILLARY
Glucose-Capillary: 125 mg/dL — ABNORMAL HIGH (ref 70–99)
Glucose-Capillary: 159 mg/dL — ABNORMAL HIGH (ref 70–99)
Glucose-Capillary: 125 mg/dL — ABNORMAL HIGH (ref 70–99)

## 2023-05-28 ENCOUNTER — Encounter (HOSPITAL_BASED_OUTPATIENT_CLINIC_OR_DEPARTMENT_OTHER): Admitting: General Surgery

## 2023-05-28 DIAGNOSIS — N3041 Irradiation cystitis with hematuria: Secondary | ICD-10-CM | POA: Diagnosis not present

## 2023-05-28 LAB — GLUCOSE, CAPILLARY
Glucose-Capillary: 201 mg/dL — ABNORMAL HIGH (ref 70–99)
Glucose-Capillary: 109 mg/dL — ABNORMAL HIGH (ref 70–99)

## 2023-05-29 ENCOUNTER — Encounter (HOSPITAL_BASED_OUTPATIENT_CLINIC_OR_DEPARTMENT_OTHER): Admitting: General Surgery

## 2023-05-29 DIAGNOSIS — N3041 Irradiation cystitis with hematuria: Secondary | ICD-10-CM | POA: Diagnosis not present

## 2023-05-29 LAB — GLUCOSE, CAPILLARY
Glucose-Capillary: 182 mg/dL — ABNORMAL HIGH (ref 70–99)
Glucose-Capillary: 180 mg/dL — ABNORMAL HIGH (ref 70–99)

## 2023-05-30 ENCOUNTER — Ambulatory Visit: Payer: Medicare Other | Admitting: Podiatry

## 2023-05-30 ENCOUNTER — Encounter (HOSPITAL_BASED_OUTPATIENT_CLINIC_OR_DEPARTMENT_OTHER): Admitting: Internal Medicine

## 2023-05-30 DIAGNOSIS — N3041 Irradiation cystitis with hematuria: Secondary | ICD-10-CM | POA: Diagnosis not present

## 2023-05-30 LAB — GLUCOSE, CAPILLARY
Glucose-Capillary: 197 mg/dL — ABNORMAL HIGH (ref 70–99)
Glucose-Capillary: 138 mg/dL — ABNORMAL HIGH (ref 70–99)

## 2023-05-31 ENCOUNTER — Encounter (HOSPITAL_BASED_OUTPATIENT_CLINIC_OR_DEPARTMENT_OTHER): Admitting: General Surgery

## 2023-05-31 DIAGNOSIS — N3041 Irradiation cystitis with hematuria: Secondary | ICD-10-CM | POA: Diagnosis not present

## 2023-05-31 LAB — GLUCOSE, CAPILLARY
Glucose-Capillary: 199 mg/dL — ABNORMAL HIGH (ref 70–99)
Glucose-Capillary: 122 mg/dL — ABNORMAL HIGH (ref 70–99)

## 2023-06-01 ENCOUNTER — Encounter (HOSPITAL_BASED_OUTPATIENT_CLINIC_OR_DEPARTMENT_OTHER): Admitting: Internal Medicine

## 2023-06-01 DIAGNOSIS — N3041 Irradiation cystitis with hematuria: Secondary | ICD-10-CM | POA: Diagnosis not present

## 2023-06-01 LAB — GLUCOSE, CAPILLARY
Glucose-Capillary: 208 mg/dL — ABNORMAL HIGH (ref 70–99)
Glucose-Capillary: 157 mg/dL — ABNORMAL HIGH (ref 70–99)

## 2023-06-04 ENCOUNTER — Encounter (HOSPITAL_BASED_OUTPATIENT_CLINIC_OR_DEPARTMENT_OTHER): Admitting: Internal Medicine

## 2023-06-04 DIAGNOSIS — N3041 Irradiation cystitis with hematuria: Secondary | ICD-10-CM | POA: Diagnosis not present

## 2023-06-04 LAB — GLUCOSE, CAPILLARY
Glucose-Capillary: 270 mg/dL — ABNORMAL HIGH (ref 70–99)
Glucose-Capillary: 169 mg/dL — ABNORMAL HIGH (ref 70–99)

## 2023-06-05 ENCOUNTER — Encounter (HOSPITAL_BASED_OUTPATIENT_CLINIC_OR_DEPARTMENT_OTHER): Admitting: General Surgery

## 2023-06-05 DIAGNOSIS — N3041 Irradiation cystitis with hematuria: Secondary | ICD-10-CM | POA: Diagnosis not present

## 2023-06-05 LAB — GLUCOSE, CAPILLARY
Glucose-Capillary: 148 mg/dL — ABNORMAL HIGH (ref 70–99)
Glucose-Capillary: 117 mg/dL — ABNORMAL HIGH (ref 70–99)

## 2023-06-06 ENCOUNTER — Encounter (HOSPITAL_BASED_OUTPATIENT_CLINIC_OR_DEPARTMENT_OTHER): Admitting: General Surgery

## 2023-06-06 DIAGNOSIS — N3041 Irradiation cystitis with hematuria: Secondary | ICD-10-CM | POA: Diagnosis not present

## 2023-06-06 LAB — GLUCOSE, CAPILLARY: Glucose-Capillary: 229 mg/dL — ABNORMAL HIGH (ref 70–99)

## 2023-06-07 ENCOUNTER — Encounter (HOSPITAL_BASED_OUTPATIENT_CLINIC_OR_DEPARTMENT_OTHER): Admitting: General Surgery

## 2023-06-07 DIAGNOSIS — N3041 Irradiation cystitis with hematuria: Secondary | ICD-10-CM | POA: Diagnosis not present

## 2023-06-07 LAB — GLUCOSE, CAPILLARY
Glucose-Capillary: 220 mg/dL — ABNORMAL HIGH (ref 70–99)
Glucose-Capillary: 216 mg/dL — ABNORMAL HIGH (ref 70–99)

## 2023-06-08 ENCOUNTER — Encounter (HOSPITAL_BASED_OUTPATIENT_CLINIC_OR_DEPARTMENT_OTHER): Admitting: General Surgery

## 2023-06-08 DIAGNOSIS — N3041 Irradiation cystitis with hematuria: Secondary | ICD-10-CM | POA: Diagnosis not present

## 2023-06-08 LAB — GLUCOSE, CAPILLARY
Glucose-Capillary: 293 mg/dL — ABNORMAL HIGH (ref 70–99)
Glucose-Capillary: 192 mg/dL — ABNORMAL HIGH (ref 70–99)

## 2023-06-11 ENCOUNTER — Encounter (HOSPITAL_BASED_OUTPATIENT_CLINIC_OR_DEPARTMENT_OTHER): Admitting: General Surgery

## 2023-06-11 DIAGNOSIS — N3041 Irradiation cystitis with hematuria: Secondary | ICD-10-CM | POA: Diagnosis not present

## 2023-06-11 LAB — GLUCOSE, CAPILLARY
Glucose-Capillary: 287 mg/dL — ABNORMAL HIGH (ref 70–99)
Glucose-Capillary: 230 mg/dL — ABNORMAL HIGH (ref 70–99)

## 2023-06-12 ENCOUNTER — Encounter (HOSPITAL_BASED_OUTPATIENT_CLINIC_OR_DEPARTMENT_OTHER): Admitting: General Surgery

## 2023-06-12 DIAGNOSIS — N3041 Irradiation cystitis with hematuria: Secondary | ICD-10-CM | POA: Diagnosis not present

## 2023-06-12 LAB — GLUCOSE, CAPILLARY
Glucose-Capillary: 273 mg/dL — ABNORMAL HIGH (ref 70–99)
Glucose-Capillary: 250 mg/dL — ABNORMAL HIGH (ref 70–99)

## 2023-07-31 ENCOUNTER — Other Ambulatory Visit: Payer: Self-pay

## 2023-07-31 ENCOUNTER — Emergency Department (HOSPITAL_COMMUNITY)
Admission: EM | Admit: 2023-07-31 | Discharge: 2023-07-31 | Disposition: A | Discharge status: Home / Self Care | Attending: Emergency Medicine | Admitting: Emergency Medicine

## 2023-07-31 DIAGNOSIS — Y846 Urinary catheterization as the cause of abnormal reaction of the patient, or of later complication, without mention of misadventure at the time of the procedure: Secondary | ICD-10-CM | POA: Insufficient documentation

## 2023-07-31 DIAGNOSIS — Z79899 Other long term (current) drug therapy: Secondary | ICD-10-CM | POA: Insufficient documentation

## 2023-07-31 DIAGNOSIS — T83091A Other mechanical complication of indwelling urethral catheter, initial encounter: Secondary | ICD-10-CM

## 2023-07-31 DIAGNOSIS — Z8673 Personal history of transient ischemic attack (TIA), and cerebral infarction without residual deficits: Secondary | ICD-10-CM | POA: Diagnosis not present

## 2023-07-31 DIAGNOSIS — Z7984 Long term (current) use of oral hypoglycemic drugs: Secondary | ICD-10-CM | POA: Insufficient documentation

## 2023-07-31 DIAGNOSIS — T83098A Other mechanical complication of other indwelling urethral catheter, initial encounter: Secondary | ICD-10-CM | POA: Insufficient documentation

## 2023-07-31 DIAGNOSIS — E119 Type 2 diabetes mellitus without complications: Secondary | ICD-10-CM | POA: Insufficient documentation

## 2023-07-31 DIAGNOSIS — I2510 Atherosclerotic heart disease of native coronary artery without angina pectoris: Secondary | ICD-10-CM | POA: Diagnosis not present

## 2023-07-31 DIAGNOSIS — Z8546 Personal history of malignant neoplasm of prostate: Secondary | ICD-10-CM | POA: Diagnosis not present

## 2023-07-31 DIAGNOSIS — I5030 Unspecified diastolic (congestive) heart failure: Secondary | ICD-10-CM | POA: Diagnosis not present

## 2023-07-31 DIAGNOSIS — R339 Retention of urine, unspecified: Secondary | ICD-10-CM | POA: Diagnosis present

## 2023-07-31 DIAGNOSIS — Z794 Long term (current) use of insulin: Secondary | ICD-10-CM | POA: Diagnosis not present

## 2023-07-31 DIAGNOSIS — I110 Hypertensive heart disease with heart failure: Secondary | ICD-10-CM | POA: Insufficient documentation

## 2023-07-31 NOTE — Discharge Instructions (Signed)
 Follow up with urologist this week  Return to ED if you have urinary retention, lower belly pain

## 2023-07-31 NOTE — ED Notes (Signed)
 Patient left before discharge could be completed, but AVS was given.

## 2023-07-31 NOTE — ED Triage Notes (Addendum)
 Pt presents to ED with suprapubic cath in place. Sts bag had not filled today. Reports concern for retention and 10/10 pain

## 2023-07-31 NOTE — ED Notes (Addendum)
 Unable to obtain a value from bladder scanner due to equipment malfunction.

## 2023-07-31 NOTE — ED Notes (Signed)
 Patient's catheter to bag tubing was straighten and standard drainage bag was placed in the appropriate position. Drainage into the bag noted. Patient now says he feels a lot better.

## 2023-07-31 NOTE — ED Provider Notes (Signed)
 [facility] EMERGENCY DEPARTMENT AT [facility] Provider Note   CSN: [id number] Arrival date & time: 07/31/23  2054     History  Chief Complaint  Patient presents with   Urinary Retention    Suprapubic Cath    [name] is a 88 y.o. male with past medical history of mitral valve repair, CAD, aortic valve replacement, CVA, T2DM, HTN, PAD, HLD, anemia, essential tremor, HFpEF, radiation cystitis, prostate cancer, chronic suprapubic catheter presents emergency department for evaluation of urinary retention and obstructed suprapubic catheter.  Reports that he has been drinking multiple glasses of water  today without catheter bag failing.  He complains of 10/10 suprapubic pain and believes he is retaining. Follow[facility].  Denies purulent drainage, erythema, warmth around suprapubic catheter, nor fevers at home.  HPI     Home Medications Prior to Admission medications   Medication Sig Start Date End Date Taking? Authorizing Provider  ferrous sulfate  325 (65 FE) MG tablet Take 1 tablet (325 mg total) by mouth daily with breakfast. 07/28/22   Leona Rake, MD  FLUoxetine  (PROZAC ) 20 MG capsule Take 20 mg by mouth at bedtime.     [provider]  furosemide  (LASIX ) 40 MG tablet Take 40 mg by mouth daily.    [provider]  gabapentin (NEURONTIN) 600 MG tablet Take 600 mg by mouth 2 (two) times daily. 07/17/22   [provider]  hyoscyamine  (LEVSIN SL) 0.125 MG SL tablet Place 2 tablets (0.25 mg total) under the tongue every 4 (four) hours as needed for cramping (or bladder spasms). 01/11/23   Haydee Lipa, MD  insulin  lispro protamine-lispro (HUMALOG 75/25 MIX) (75-25) 100 UNIT/ML SUSP injection Inject 16 Units into the skin 2 (two) times daily with a meal.    [provider]  losartan  (COZAAR ) 50 MG tablet Take 50 mg by mouth daily.    [provider]  Melatonin 3 MG TABS Take 1 tablet by mouth at bedtime.      [provider]  metFORMIN  (GLUCOPHAGE -XR) 500 MG 24 hr tablet Take 500 mg by mouth 2 (two) times daily with a meal. 12/12/21   [provider]  metoprolol  succinate (TOPROL -XL) 100 MG 24 hr tablet Take 100 mg by mouth at bedtime.    [provider]  Multiple Vitamin (MULTIVITAMIN) tablet Take 1 tablet by mouth daily.    [provider]  nitroGLYCERIN  (NITROSTAT ) 0.4 MG SL tablet Place 1 tablet (0.4 mg total) under the tongue every 5 (five) minutes as needed. For chest pain Patient taking differently: Place 0.4 mg under the tongue every 5 (five) minutes as needed for chest pain. For chest pain 03/28/12   [name], [name], MD  rosuvastatin  (CRESTOR ) 10 MG tablet Take 10 mg by mouth every morning.     [provider]  tamsulosin  (FLOMAX ) 0.4 MG CAPS capsule Take 0.4 mg by mouth daily. 12/04/22   [provider]  zolpidem  (AMBIEN ) 5 MG tablet Take 2.5 mg by mouth at bedtime as needed for sleep.    [provider]      Allergies    Patient has no known allergies.    Review of Systems   Review of Systems  Constitutional:  Negative for chills, fatigue and fever.  Respiratory:  Negative for cough, chest tightness, shortness of breath and wheezing.   Cardiovascular:  Negative for chest pain and palpitations.  Gastrointestinal:  Negative for abdominal pain, constipation, diarrhea, nausea and vomiting.  Neurological:  Negative for dizziness, seizures, weakness, light-headedness, numbness and headaches.    Physical Exam Updated Vital Signs BP (!) 153/60 (BP Location: Left Arm)   Pulse 80   Resp 18   SpO2 98%  Physical Exam Vitals and nursing note reviewed.  Constitutional:      General: He is not in acute distress.    Appearance: Normal appearance.  HENT:     Head: Normocephalic and atraumatic.  Eyes:     Conjunctiva/sclera: Conjunctivae normal.  Cardiovascular:     Rate and Rhythm: Normal rate.  Pulmonary:     Effort: Pulmonary  effort is normal. No respiratory distress.     Breath sounds: Normal breath sounds.  Chest:     Chest wall: No tenderness.  Abdominal:     General: Bowel sounds are normal.     Palpations: Abdomen is soft.     Tenderness: There is abdominal tenderness in the suprapubic area.     Comments: Initially had suprapubic tenderness.  However, following unkinking catheter and urine flowing into urine bag, had no complaints of suprapubic tenderness. Suprapubic catheter without surrounding warmth, erythema, or cellulitis.  There is no drainage from site  Skin:    General: Skin is warm.     Capillary Refill: Capillary refill takes less than 2 seconds.     Coloration: Skin is not jaundiced or pale.  Neurological:     Mental Status: He is alert and oriented to person, place, and time. Mental status is at baseline.     ED Results / Procedures / Treatments   Labs (all labs ordered are listed, but only abnormal results are displayed) Labs Reviewed  URINALYSIS, ROUTINE W REFLEX MICROSCOPIC    EKG None  Radiology No results found.  Procedures Procedures    Medications Ordered in ED Medications - No data to display  ED Course/ Medical Decision Making/ A&P                                 Medical Decision Making Amount and/or Complexity of Data Reviewed Labs: ordered.   Patient presents to the ED for concern of urinary retention, catheter obstruction, this involves an extensive number of treatment options, and is a complaint that carries with it a high risk of complications and morbidity.  The differential diagnosis includes urethral trauma, clot, nephrolithiasis, catheter complication, infection around suprapubic catheter   Co morbidities that complicate the patient evaluation  See HPI   Additional history obtained:  Additional history obtained from Nursing   External records from outside source obtained and reviewed including triage note    Problem List / ED  Course:  Obstruction of Foley catheter Initially, patient appears to be retaining urine as he is suprapubic and bladder distention, pain.  Attempted to BladderScan patient however there is no bladder scanner in the emergency department.  Attempted to get bladder scanner from upstairs and they would not allow us  to use the bladder scanner as they report we had not returned it in the past and they have given as 1 previously. Following un kinking catheter, this improved.  Continued to monitor patient for 1 hour following and he continues to have no suprapubic pain nor complaints of retention.   Urinary bag filled to 500 cc with continuous flow from tubing.  Urine does not appear red or cloudy With improvement of symptoms as well catheter flowing easily, I do not feel that bladder scan is required.  Do not feel that patient needs a UA Low suspicion for infection around suprapubic site with reassuring physical exam. No tachycardia Patient reports resolution of symptoms and is asking to be discharged Patient has [facility] appointment with [facility] in [facility] weeks.  Recommended patient to f/u with [facility] this week   Reevaluation:  After the interventions noted above, I reevaluated the patient and found that they have :resolved     Dispostion:  After consideration of the diagnostic results and the patients response to treatment, I feel that the patent would benefit from outpatient management with urology follow-up.   Discussed ED workup, disposition, return to ED precautions with patient who expresses understanding agrees with plan.  All questions answered to their satisfaction.  They are agreeable to plan.  Discharge instructions provided on paperwork  Final Clinical Impression(s) / ED Diagnoses Final diagnoses:  Obstruction of Foley catheter, initial encounter [facility])    Rx / DC Orders ED Discharge Orders     None         Royann Cords, PA 07/31/23 2357    Guadalupe Lee,  MD 08/07/23 [phone]

## 2023-08-02 ENCOUNTER — Other Ambulatory Visit: Payer: Self-pay | Admitting: Urology

## 2023-08-02 ENCOUNTER — Other Ambulatory Visit (HOSPITAL_COMMUNITY): Payer: Self-pay

## 2023-09-07 ENCOUNTER — Other Ambulatory Visit (HOSPITAL_COMMUNITY): Payer: Self-pay

## 2023-10-01 ENCOUNTER — Ambulatory Visit: Admitting: Podiatry

## 2023-10-22 ENCOUNTER — Encounter: Payer: Self-pay | Admitting: Podiatry

## 2023-10-22 ENCOUNTER — Ambulatory Visit (INDEPENDENT_AMBULATORY_CARE_PROVIDER_SITE_OTHER): Admitting: Podiatry

## 2023-10-22 DIAGNOSIS — M79674 Pain in right toe(s): Secondary | ICD-10-CM

## 2023-10-22 DIAGNOSIS — M79675 Pain in left toe(s): Secondary | ICD-10-CM

## 2023-10-22 DIAGNOSIS — B351 Tinea unguium: Secondary | ICD-10-CM

## 2023-10-22 NOTE — Progress Notes (Unsigned)
 Subjective:  Patient ID: [name], male    DOB: [birth year],  MRN: [id number]  [name] presents to clinic today for:  Chief Complaint  Patient presents with   Diabetes    [facility] mycotic toenail trim. IDDM A1C 7.0 Numbness in the feet. Yellow toenail. Last seen 11 months ago.   Patient notes nails are thick, discolored, elongated and painful in shoegear when trying to ambulate.  It has been almost a year since patient was last seen.  States that he has been hospital for complications from prostate cancer treatment.  He underwent hyperbaric oxygen  treatments as well.  He is interested in finding out if there are any neurology offices that perform acupuncture.  PCP is [name] Ingle, MD.  Past Medical History:  Diagnosis Date   Anemia    Anginal pain (HCC)    Atrial flutter [facility])    Bladder outlet obstruction    Carotid artery occlusion    Coronary artery disease    post stents; prior CABG; s/p cath January 2014 with 2 VD, patent SVG to OM3 and patent SVG to PL patent   Degenerative disk disease    GERD (gastroesophageal reflux disease)    History of esophageal stricture    has it stretched q once in awhile (03/26/2012   Hyperlipidemia    Hypertension    Kidney stones    Major depression    OSA (obstructive sleep apnea)    have a mask; haven't used one in years (03/26/2012)   Prostate cancer (HCC)    S/P mitral valve replacement with bioprosthetic valve 02/20/2014   Skin cancer of nose 2012   right side (03/26/2012)   Skin cancer of trunk    left chest (03/26/2012)   Spinal stenosis    Stroke (HCC) 2014   NO RESIDUAL PROBLEMS   Type II diabetes mellitus (HCC)    Valvular heart disease    Past Surgical History:  Procedure Laterality Date   A-FLUTTER ABLATION N/A 12/01/2016   Procedure: A-Flutter Ablation;  Surgeon: Kelsie Agent, MD;  Location: [facility] INVASIVE CV LAB;  Service: Cardiovascular;  Laterality: N/A;   ADENOIDECTOMY  1956   ANTERIOR CERVICAL  DECOMP/DISCECTOMY FUSION  2000's   AORTIC VALVE REPLACEMENT  08/29/2010   25 mm CE Magna bioprosthetic - [facility]   APPENDECTOMY  1951   CARDIAC CATHETERIZATION  10/20/2003   Est. EF of 65% -- Critical disease in the mid to distal circumflex --  Preserved left ventricular  function --    CARDIAC VALVE REPLACEMENT     CAROTID ENDARTERECTOMY  05/05/2003   right carotid endarterectomy   CERVICAL DISC SURGERY  1980's   CORONARY ANGIOPLASTY WITH STENT PLACEMENT  10/20/2003   Percutaneous coronary intervention/drug-eluding stent implantation, third marginal branch --  Percutaneous closure, right femoral artery -- Atherosclerotic coronary vascular disease, single vessel / Status post successful percutaneous coronary intervention/tandem drug -- eluding stent implantation proximal third marginal branch -- Typical angina was not reproduced with device insertion or balloon    CORONARY ARTERY BYPASS GRAFT  08/29/2010   CABG X2; SVG to PDA, SVG to OM2- [facility]   CYSTOSCOPY WITH FULGERATION N/A 09/17/2022   Procedure: CYSTOSCOPY WITH FULGERATION AND CLOT EVACUATION;  Surgeon: Alvaro Ricardo KATHEE Mickey., MD;  Location: [facility] ORS;  Service: Urology;  Laterality: N/A;   CYSTOSCOPY WITH FULGERATION N/A 01/09/2023   Procedure: CYSTOSCOPY WITH CLOT EVACUATION, FULGERATION;  Surgeon: Elisabeth Valli BIRCH, MD;  Location: [facility] ORS;  Service:  Urology;  Laterality: N/A;   ESOPHAGOGASTRODUODENOSCOPY (EGD) WITH PROPOFOL  N/A 07/27/2022   Procedure: ESOPHAGOGASTRODUODENOSCOPY (EGD) WITH PROPOFOL ;  Surgeon: Rosalie Kitchens, MD;  Location: [facility] ENDOSCOPY;  Service: Gastroenterology;  Laterality: N/A;   HEMATOMA EVACUATION  05/09/2003   Evacuation of hematoma, right neck   IR CATHETER TUBE CHANGE  02/26/2023   LEFT HEART CATHETERIZATION WITH CORONARY ANGIOGRAM N/A 03/27/2012   Procedure: LEFT HEART CATHETERIZATION WITH CORONARY ANGIOGRAM;  Surgeon: Peter M Swaziland, MD;  Location: [facility] CATH LAB;  Service: Cardiovascular;  Laterality: N/A;   LUMBAR DISC  SURGERY  1996   MITRAL VALVE REPAIR  08/23/2010   28 mm Simulus ring -[facility]   POSTERIOR FUSION LUMBAR SPINE  1997   PROSTATE BIOPSY     SHOULDER ARTHROSCOPY  01/07/2007   Left shoulder impingement, labral tear   SKIN CANCER EXCISION  1990's; 2012   left chest & right nose (03/26/2012)   TEE WITHOUT CARDIOVERSION N/A 05/15/2012   Procedure: TRANSESOPHAGEAL ECHOCARDIOGRAM (TEE);  Surgeon: Aleene JINNY Passe, MD;  Location: [facility] ENDOSCOPY;  Service: Cardiovascular;  Laterality: N/A;   TEE WITHOUT CARDIOVERSION N/A 12/01/2016   Procedure: TRANSESOPHAGEAL ECHOCARDIOGRAM (TEE);  Surgeon: Okey Vina GAILS, MD;  Location: [facility] ENDOSCOPY;  Service: Cardiovascular;  Laterality: N/A;   TONSILLECTOMY AND ADENOIDECTOMY  1956   TRANSURETHRAL RESECTION OF PROSTATE N/A 04/26/2015   Procedure: TRANSURETHRAL RESECTION OF THE PROSTATE WITH GYRUS INSTRUMENTS;  Surgeon: Morene LELON Salines, MD;  Location: [facility] ORS;  Service: Urology;  Laterality: N/A;  TURP-BIPOLAR   No Known Allergies  Review of Systems: Negative except as noted in the HPI.  Objective:  [name] is a pleasant 88 y.o. male in NAD. AAO x 3.  Vascular Examination: Capillary refill time is 3-5 seconds to toes bilateral. Palpable pedal pulses b/l LE. Digital hair present b/l.  Skin temperature gradient WNL b/l. No varicosities b/l. No cyanosis noted b/l.   Dermatological Examination: Pedal skin with normal turgor, texture and tone b/l. No open wounds. No interdigital macerations b/l. Toenails x10 are 3mm thick, discolored, dystrophic with subungual debris. There is pain with compression of the nail plates.  They are elongated x10  Assessment/Plan: 1. Pain due to onychomycosis of toenails of both feet    The mycotic toenails were sharply debrided x10 with sterile nail nippers and a power debriding burr to decrease bulk/thickness and length.    Inform patient that would check and see if any of the neurology offices in the [place] area also perform  acupuncture.  Will also check into other acupuncture facilities so that he has some information if the neurology offices do not offer this service.  Return in about 3 months (around 01/22/2024) for [facility].   [name], DPM, FACFAS [facility]     [address] [place], [place] [address]                Office [phone]  Fax [phone]

## 2023-10-24 ENCOUNTER — Encounter: Payer: Self-pay | Admitting: Podiatry

## 2023-12-21 ENCOUNTER — Encounter: Payer: Self-pay | Admitting: Cardiology

## 2023-12-21 ENCOUNTER — Ambulatory Visit: Attending: Cardiology | Admitting: Cardiology

## 2023-12-21 VITALS — BP 164/70 | HR 64 | Resp 16 | Ht 66.0 in | Wt 142.0 lb

## 2023-12-21 DIAGNOSIS — I2510 Atherosclerotic heart disease of native coronary artery without angina pectoris: Secondary | ICD-10-CM | POA: Diagnosis present

## 2023-12-21 DIAGNOSIS — E1165 Type 2 diabetes mellitus with hyperglycemia: Secondary | ICD-10-CM | POA: Diagnosis present

## 2023-12-21 DIAGNOSIS — I10 Essential (primary) hypertension: Secondary | ICD-10-CM | POA: Insufficient documentation

## 2023-12-21 NOTE — Patient Instructions (Signed)

## 2023-12-21 NOTE — Progress Notes (Signed)
 Cardiology Office Note:  .   Date:  12/21/2023  ID:  [name], DOB 1935-10-24, MRN [id number] PCP: [name], MD  [facility] Providers Cardiologist:  Newman Lawrence, MD PCP: [name], MD  Chief Complaint  Patient presents with   Coronary Artery Disease     [name] is a 88 y.o. male with hypertension, hyperlipidemia, type II DM, CAD s/p CABG X 2 _(SVG-OM, SVG-PDA) + 28 mm mitral ring annuloplasty and a 25 mm CE Perimount magna bioprosthetic aortic valve replacement in June 2012, moderate PI, PAD s/p Rt CEA, h/o atrial flutter s/p ablation, h/o ischemic stroke, h/o amyloid angiopathy and microhemorrhage-thus not on anticoagulation but on Aspirin  monotherapy  Discussed the use of AI scribe software for clinical note transcription with the patient, who gave verbal consent to proceed.  History of Present Illness [name] is an 88 year old male with coronary artery disease and atrial flutter who presents for a cardiology follow-up. He lives at home with his partner, who assists with his care.  He has a significant cardiac history, including coronary artery bypass surgery and aortic and mitral valve replacement in 2012. He has experienced atrial flutter in the past and underwent a flutter ablation. He was last seen by a cardiologist at [facility] in February 2025, where an echocardiogram was performed and reviewed. He has no current chest pain or shortness of breath, attributing his limited physical activity to neuropathy rather than cardiac issues.  His hypertension is managed with losartan  50 mg and metoprolol  100 mg. He does not regularly check his blood pressure at home.  He was previously on warfarin and aspirin  due to his cardiac history, but both were stopped due to bleeding issues, including significant blood loss in his urine requiring transfusions. He underwent hyperbaric oxygen  treatments, which resolved the bleeding. He is scheduled to  see his urologist, Dr. Cam, next week.  He manages his diabetes with insulin  as needed and metformin , reporting that it is well-controlled. His neuropathy has significantly impacted his mobility, requiring the use of a walker and limiting his ability to exercise.      Vitals:   12/21/23 1454 12/21/23 1514  BP: (!) 160/63 (!) 164/70  Pulse: 64   Resp: 16   SpO2: 98%       Review of Systems  Cardiovascular:  Negative for chest pain, dyspnea on exertion, leg swelling, palpitations and syncope.        Studies Reviewed: SABRA   EKG Interpretation Date/Time:  Friday December 21 2023 14:56:36 EDT Ventricular Rate:  55 PR Interval:    QRS Duration:  112 QT Interval:  478 QTC Calculation: 457 R Axis:   30  Text Interpretation: EKG 12/21/2023: Probable sinus hrythm 55 bpm Junctional rhythm When compared with ECG of 24-Jul-2022 20:14, rate is slower  Confirmed by Lawrence Newman [id number] on 12/21/2023 4:54:48 PM    EKG 12/21/2023: Probable sinus hrythm 55 bpm Junctional rhythm When compared with ECG of 24-Jul-2022 20:14,  rate is slower     External echcoardiogram2/2025 ([facility]): INTERPRETATION --------------------------------------------------------------- NORMAL LEFT VENTRICULAR SYSTOLIC FUNCTION NORMAL RIGHT VENTRICULAR SYSTOLIC FUNCTION VALVULAR REGURGITATION: TRIVIAL MR, MODERATE PR, MILD TR PROSTHETIC VALVE(S): BIOPROSTHETIC AoV, PROSTHETIC MV RING MILD TO MODERATE TR WELL SEATED BIOPROSTHETIC AORTIC VALVE WITHOUT REGURGITATION S/P MITRAL VALVE REPAIR WITHOUT SIGNIFICANT GRADIENT ACROSS MV OR REGURGITATION 3D acquisition and reconstructions were performed as part of this examination to more accurately quantify the effects of moderate or greater valve regurgitation.  Echocardiogram 07/2022:  1. Left ventricular ejection fraction, by estimation, is 55 to 60%. The  left ventricle has normal function. The left ventricle has no regional  wall motion abnormalities.  Left ventricular diastolic function could not  be evaluated.   2. Right ventricular systolic function is mildly reduced. The right  ventricular size is moderately enlarged. There is moderately elevated  pulmonary artery systolic pressure. The estimated right ventricular  systolic pressure is 54.3 mmHg.   3. Left atrial size was mildly dilated.   4. Right atrial size was mildly dilated.   5. 26 mm annuloplasty ring. MG 9 mm HG @ 77 bpm. Higher than reported at  [facility] 06/05/2022 (4 mmHG @ 69 bpm). Suspect a mild element of stenosis with  the small annuloplasty ring with higher gradient on this study explained  by higher heart rate/sepsis/anemia. Would recommend repeat echo after acute illness.  The mitral valve has been repaired/replaced. No evidence of mitral valve  regurgitation. Mild mitral stenosis. The mean mitral valve gradient is 9.0  mmHg with average heart rate of 77 bpm. There is a 28 mm prosthetic annuloplasty  ring present in the mitral position. Procedure Date: 08/2010.   6. Tricuspid valve regurgitation is mild to moderate.   7. 25 mm CE bioprosthetic aortic valve replacement. Vmax 2.6 m/s, MG 17  mmHG, EOA 1.56 cm2. MG 8 mmHG at [facility] (06/05/2022). Gradients could be  higher due to sepsis/anemia. Would recommend repeat echo after acute  illness. The aortic valve has been repaired/replaced. Aortic valve regurgitation  is not visualized. Procedure Date: 08/2010. Aortic valve area, by VTI measures  1.56 cm. Aortic valve mean gradient measures 17.0 mmHg. Aortic valve Vmax measures 2.61 m/s.   8. The inferior vena cava is normal in size with greater than 50%  respiratory variability, suggesting right atrial pressure of 3 mmHg.   Comparison(s): Prior images unable to be directly viewed, comparison made  by report only. Changes from prior study are noted. Gradients higher  across Aoritc prosthesis and MV repair compared with [facility] study 06/05/2022.  LV function stable. RV function   reduced and moderately dilated RV chamber compared to what was reported at  [facility].    Independently interpreted 09/2023: Chol 143, TG 178, HDL 34, LDL 79 HbA1C 6.8% Hb 10.3 TSH 2.2  Labs 12/2022: Hb 9.6 Cr 1.16   Risk Assessment/Calculations:    CHA2DS2-VASc Score = 7   This indicates a 11.2% annual risk of stroke. The patient's score is based upon: CHF History: 0 HTN History: 1 Diabetes History: 1 Stroke History: 2 Vascular Disease History: 1 Age Score: 2 Gender Score: 0    Physical Exam Vitals and nursing note reviewed.  Constitutional:      General: He is not in acute distress. Neck:     Vascular: No JVD.  Cardiovascular:     Rate and Rhythm: Normal rate and regular rhythm.     Heart sounds: Murmur heard.     Harsh midsystolic murmur is present with a grade of 1/6 at the upper right sternal border radiating to the neck.  Pulmonary:     Effort: Pulmonary effort is normal.     Breath sounds: Normal breath sounds. No wheezing or rales.  Musculoskeletal:     Right lower leg: No edema.     Left lower leg: No edema.      VISIT DIAGNOSES:   ICD-10-CM   1. Essential hypertension  I10 EKG 12-Lead    2. Coronary artery disease  involving native coronary artery of native heart without angina pectoris  I25.10     3. Controlled type 2 diabetes mellitus with hyperglycemia [facility])  E11.65        [name] is a 88 y.o. male with hypertension, hyperlipidemia, type II DM, CAD s/p CABG X 2 _(SVG-OM, SVG-PDA) + 28 mm mitral ring annuloplasty and a 25 mm CE Perimount magna bioprosthetic aortic valve replacement in June 2012, moderate PI, PAD s/p Rt CEA, h/o atrial flutter s/p ablation, h/o ischemic stroke, h/o amyloid angiopathy and microhemorrhage-thus not on anticoagulation but on Aspirin  monotherapy  Assessment & Plan CAD: S/p CABG X 2 along with mitral valve repair and bioprosthetic aortic valve replacement. No anginal symptoms. Ideally, would like him to be  on aspirin  81 mg daily, if okay with urology given the recent bleeding. Continue statin, lipids fairly well controlled.   Atrial flutter: Prior ablation. Not on anticoagulation given amyloid angiopathy and prior ablation with no recurrence.  Essential hypertension: Blood pressure 164/70, elevated systolic. Does not regularly monitor blood pressure at home. On losartan  50 mg. No medication adjustment based on single reading. - Check blood pressure at home regularly. - Send MyChart message if blood pressure consistently >130/80. - Consider increasing losartan  to 100 mg if blood pressure remains elevated.  Type 2 diabetes mellitus: Well controlled with insulin  as needed and metformin .       F/u in 6 months  Signed, Newman JINNY Lawrence, MD

## 2024-01-28 ENCOUNTER — Encounter: Admitting: Podiatry

## 2024-01-28 NOTE — Progress Notes (Signed)
 Patient did not show for his scheduled appointment this afternoon This encounter was created in error - please disregard.

## 2024-02-04 ENCOUNTER — Ambulatory Visit: Admitting: Podiatry

## 2024-02-04 ENCOUNTER — Encounter: Payer: Self-pay | Admitting: Podiatry

## 2024-02-04 DIAGNOSIS — B351 Tinea unguium: Secondary | ICD-10-CM | POA: Diagnosis not present

## 2024-02-04 DIAGNOSIS — M79675 Pain in left toe(s): Secondary | ICD-10-CM | POA: Diagnosis not present

## 2024-02-04 DIAGNOSIS — M79674 Pain in right toe(s): Secondary | ICD-10-CM | POA: Diagnosis not present

## 2024-02-04 DIAGNOSIS — E1139 Type 2 diabetes mellitus with other diabetic ophthalmic complication: Secondary | ICD-10-CM

## 2024-02-04 DIAGNOSIS — Z794 Long term (current) use of insulin: Secondary | ICD-10-CM

## 2024-02-04 NOTE — Progress Notes (Signed)
 Subjective:  Patient ID: [name], male    DOB: 1935-07-29,  MRN: [id number]  [name] presents to clinic today for:  Chief Complaint  Patient presents with   Diabetes    [facility] IDDM A1C 7.0. Toenail trim.   Patient notes nails are thick, discolored, elongated and painful in shoegear when trying to ambulate.  He uses a walker to assist with ambulation.  He had missed his appointment last week.  PCP is [name], MD.  Past Medical History:  Diagnosis Date   Anemia    Anginal pain    Atrial flutter [facility])    Bladder outlet obstruction    Carotid artery occlusion    Coronary artery disease    post stents; prior CABG; s/p cath January 2014 with 2 VD, patent SVG to OM3 and patent SVG to PL patent   Degenerative disk disease    GERD (gastroesophageal reflux disease)    History of esophageal stricture    has it stretched q once in awhile (03/26/2012   Hyperlipidemia    Hypertension    Kidney stones    Major depression    OSA (obstructive sleep apnea)    have a mask; haven't used one in years (03/26/2012)   Prostate cancer (HCC)    S/P mitral valve replacement with bioprosthetic valve 02/20/2014   Skin cancer of nose 2012   right side (03/26/2012)   Skin cancer of trunk    left chest (03/26/2012)   Spinal stenosis    Stroke (HCC) 2014   NO RESIDUAL PROBLEMS   Type II diabetes mellitus (HCC)    Valvular heart disease    Past Surgical History:  Procedure Laterality Date   A-FLUTTER ABLATION N/A 12/01/2016   Procedure: A-Flutter Ablation;  Surgeon: Kelsie Agent, MD;  Location: [facility] INVASIVE CV LAB;  Service: Cardiovascular;  Laterality: N/A;   ADENOIDECTOMY  1956   ANTERIOR CERVICAL DECOMP/DISCECTOMY FUSION  2000's   AORTIC VALVE REPLACEMENT  08/29/2010   25 mm CE Magna bioprosthetic - [facility]   APPENDECTOMY  1951   CARDIAC CATHETERIZATION  10/20/2003   Est. EF of 65% -- Critical disease in the mid to distal circumflex --  Preserved left ventricular   function --    CARDIAC VALVE REPLACEMENT     CAROTID ENDARTERECTOMY  05/05/2003   right carotid endarterectomy   CERVICAL DISC SURGERY  1980's   CORONARY ANGIOPLASTY WITH STENT PLACEMENT  10/20/2003   Percutaneous coronary intervention/drug-eluding stent implantation, third marginal branch --  Percutaneous closure, right femoral artery -- Atherosclerotic coronary vascular disease, single vessel / Status post successful percutaneous coronary intervention/tandem drug -- eluding stent implantation proximal third marginal branch -- Typical angina was not reproduced with device insertion or balloon    CORONARY ARTERY BYPASS GRAFT  08/29/2010   CABG X2; SVG to PDA, SVG to OM2- [facility]   CYSTOSCOPY WITH FULGERATION N/A 09/17/2022   Procedure: CYSTOSCOPY WITH FULGERATION AND CLOT EVACUATION;  Surgeon: Alvaro Ricardo KATHEE Mickey., MD;  Location: [facility] ORS;  Service: Urology;  Laterality: N/A;   CYSTOSCOPY WITH FULGERATION N/A 01/09/2023   Procedure: CYSTOSCOPY WITH CLOT EVACUATION, FULGERATION;  Surgeon: Elisabeth Valli BIRCH, MD;  Location: [facility] OR[facility];  Service: Urology;  Laterality: N/A;   ESOPHAGOGASTRODUODENOSCOPY (EGD) WITH PROPOFOL  N/A 07/27/2022   Procedure: ESOPHAGOGASTRODUODENOSCOPY (EGD) WITH PROPOFOL ;  Surgeon: Rosalie Kitchens, MD;  Location: [facility] ENDOSCOPY;  Service: Gastroenterology;  Laterality: N/A;   HEMATOMA EVACUATION  05/09/2003   Evacuation of hematoma, right neck  IR CATHETER TUBE CHANGE  02/26/2023   LEFT HEART CATHETERIZATION WITH CORONARY ANGIOGRAM N/A 03/27/2012   Procedure: LEFT HEART CATHETERIZATION WITH CORONARY ANGIOGRAM;  Surgeon: Peter M Jordan, MD;  Location: [facility] CATH LAB;  Service: Cardiovascular;  Laterality: N/A;   LUMBAR DISC SURGERY  1996   MITRAL VALVE REPAIR  08/23/2010   28 mm Simulus ring -[facility]   POSTERIOR FUSION LUMBAR SPINE  1997   PROSTATE BIOPSY     SHOULDER ARTHROSCOPY  01/07/2007   Left shoulder impingement, labral tear   SKIN CANCER EXCISION  1990's; 2012   left chest & right  nose (03/26/2012)   TEE WITHOUT CARDIOVERSION N/A 05/15/2012   Procedure: TRANSESOPHAGEAL ECHOCARDIOGRAM (TEE);  Surgeon: Aleene JINNY Passe, MD;  Location: [facility] ENDOSCOPY;  Service: Cardiovascular;  Laterality: N/A;   TEE WITHOUT CARDIOVERSION N/A 12/01/2016   Procedure: TRANSESOPHAGEAL ECHOCARDIOGRAM (TEE);  Surgeon: Okey Vina GAILS, MD;  Location: [facility] ENDOSCOPY;  Service: Cardiovascular;  Laterality: N/A;   TONSILLECTOMY AND ADENOIDECTOMY  1956   TRANSURETHRAL RESECTION OF PROSTATE N/A 04/26/2015   Procedure: TRANSURETHRAL RESECTION OF THE PROSTATE WITH GYRUS INSTRUMENTS;  Surgeon: Morene LELON Salines, MD;  Location: [facility] ORS;  Service: Urology;  Laterality: N/A;  TURP-BIPOLAR   No Known Allergies  Review of Systems: Negative except as noted in the HPI.  Objective:  [name] is a pleasant 88 y.o. male in NAD. AAO x 3.  Vascular Examination: Capillary refill time is 3-5 seconds to toes bilateral. Palpable pedal pulses b/l LE. Digital hair present b/l.  Skin temperature gradient WNL b/l.  Mild telangiectasias in the lower legs and ankles.  No cyanosis noted b/l.   Dermatological Examination: Pedal skin decreased normal turgor, texture and tone b/l. No open wounds. No interdigital macerations b/l. Toenails x10 are 3mm thick, discolored, dystrophic with subungual debris. There is pain with compression of the nail plates.  They are elongated x10  Assessment/Plan: 1. Pain due to onychomycosis of toenails of both feet   2. Type 2 diabetes mellitus with other ophthalmic complication, with long-term current use of insulin  (HCC)    The mycotic toenails were sharply debrided x10 with sterile nail nippers and a power debriding burr to decrease bulk/thickness and length.    Return in about 3 months (around 05/06/2024) for [facility].   Awanda CHARM Imperial, DPM, FACFAS [facility]     [address] [place], [place] [address]                Office [phone]  Fax [phone]

## 2024-02-29 ENCOUNTER — Encounter (HOSPITAL_BASED_OUTPATIENT_CLINIC_OR_DEPARTMENT_OTHER): Payer: Medicare Other | Admitting: General Surgery

## 2024-05-08 ENCOUNTER — Ambulatory Visit: Admitting: Podiatry
